# Patient Record
Sex: Female | Born: 1935 | Race: White | Hispanic: No | Marital: Married | Smoking: Former smoker
Health system: Southern US, Community
[De-identification: ages and names within clinical notes are randomized; demographics above are authoritative.]

## PROBLEM LIST (undated history)

## (undated) DIAGNOSIS — I2699 Other pulmonary embolism without acute cor pulmonale: Secondary | ICD-10-CM

## (undated) DIAGNOSIS — R1310 Dysphagia, unspecified: Secondary | ICD-10-CM

## (undated) DIAGNOSIS — E079 Disorder of thyroid, unspecified: Secondary | ICD-10-CM

## (undated) DIAGNOSIS — E785 Hyperlipidemia, unspecified: Secondary | ICD-10-CM

## (undated) DIAGNOSIS — I10 Essential (primary) hypertension: Secondary | ICD-10-CM

## (undated) DIAGNOSIS — R059 Cough, unspecified: Secondary | ICD-10-CM

## (undated) DIAGNOSIS — R05 Cough: Secondary | ICD-10-CM

## (undated) DIAGNOSIS — R0982 Postnasal drip: Secondary | ICD-10-CM

## (undated) DIAGNOSIS — I493 Ventricular premature depolarization: Secondary | ICD-10-CM

## (undated) DIAGNOSIS — Z9289 Personal history of other medical treatment: Secondary | ICD-10-CM

## (undated) DIAGNOSIS — K219 Gastro-esophageal reflux disease without esophagitis: Secondary | ICD-10-CM

## (undated) DIAGNOSIS — D6851 Activated protein C resistance: Secondary | ICD-10-CM

## (undated) DIAGNOSIS — IMO0001 Reserved for inherently not codable concepts without codable children: Secondary | ICD-10-CM

## (undated) DIAGNOSIS — I82409 Acute embolism and thrombosis of unspecified deep veins of unspecified lower extremity: Secondary | ICD-10-CM

## (undated) HISTORY — DX: Disorder of thyroid, unspecified: E07.9

## (undated) HISTORY — DX: Essential (primary) hypertension: I10

## (undated) HISTORY — DX: Cough: R05

## (undated) HISTORY — PX: APPENDECTOMY: SHX54

## (undated) HISTORY — DX: Reserved for inherently not codable concepts without codable children: IMO0001

## (undated) HISTORY — DX: Dysphagia, unspecified: R13.10

## (undated) HISTORY — DX: Cough, unspecified: R05.9

## (undated) HISTORY — DX: Personal history of other medical treatment: Z92.89

## (undated) HISTORY — PX: ESOPHAGEAL DILATION: SHX303

## (undated) HISTORY — DX: Gastro-esophageal reflux disease without esophagitis: K21.9

## (undated) HISTORY — DX: Other pulmonary embolism without acute cor pulmonale: I26.99

## (undated) HISTORY — DX: Acute embolism and thrombosis of unspecified deep veins of unspecified lower extremity: I82.409

## (undated) HISTORY — PX: HAMMER TOE SURGERY: SHX385

## (undated) HISTORY — PX: BUNIONECTOMY: SHX129

## (undated) HISTORY — DX: Activated protein C resistance: D68.51

## (undated) HISTORY — DX: Ventricular premature depolarization: I49.3

## (undated) HISTORY — DX: Postnasal drip: R09.82

## (undated) HISTORY — DX: Hyperlipidemia, unspecified: E78.5

---

## 1971-02-22 HISTORY — PX: CHOLECYSTECTOMY: SHX55

## 1997-07-11 ENCOUNTER — Ambulatory Visit (HOSPITAL_COMMUNITY): Admission: RE | Admit: 1997-07-11 | Discharge: 1997-07-11 | Payer: Self-pay | Admitting: *Deleted

## 1998-03-02 ENCOUNTER — Ambulatory Visit (HOSPITAL_COMMUNITY): Admission: RE | Admit: 1998-03-02 | Discharge: 1998-03-02 | Payer: Self-pay

## 1998-07-03 ENCOUNTER — Ambulatory Visit (HOSPITAL_COMMUNITY): Admission: RE | Admit: 1998-07-03 | Discharge: 1998-07-03 | Payer: Self-pay | Admitting: *Deleted

## 1999-03-05 ENCOUNTER — Encounter: Payer: Self-pay | Admitting: Gastroenterology

## 1999-03-05 ENCOUNTER — Ambulatory Visit (HOSPITAL_COMMUNITY): Admission: RE | Admit: 1999-03-05 | Discharge: 1999-03-05 | Payer: Self-pay

## 1999-03-05 ENCOUNTER — Encounter: Admission: RE | Admit: 1999-03-05 | Discharge: 1999-03-05 | Payer: Self-pay | Admitting: Gastroenterology

## 1999-03-05 ENCOUNTER — Encounter: Payer: Self-pay | Admitting: *Deleted

## 1999-07-16 ENCOUNTER — Ambulatory Visit (HOSPITAL_COMMUNITY): Admission: RE | Admit: 1999-07-16 | Discharge: 1999-07-16 | Payer: Self-pay | Admitting: Obstetrics & Gynecology

## 2000-03-06 ENCOUNTER — Encounter: Payer: Self-pay | Admitting: Family Medicine

## 2000-03-06 ENCOUNTER — Ambulatory Visit (HOSPITAL_COMMUNITY): Admission: RE | Admit: 2000-03-06 | Discharge: 2000-03-06 | Payer: Self-pay | Admitting: Obstetrics & Gynecology

## 2000-07-14 ENCOUNTER — Ambulatory Visit (HOSPITAL_COMMUNITY): Admission: RE | Admit: 2000-07-14 | Discharge: 2000-07-14 | Payer: Self-pay | Admitting: *Deleted

## 2000-08-09 ENCOUNTER — Encounter: Payer: Self-pay | Admitting: *Deleted

## 2000-08-09 ENCOUNTER — Encounter: Admission: RE | Admit: 2000-08-09 | Discharge: 2000-08-09 | Payer: Self-pay | Admitting: *Deleted

## 2001-03-07 ENCOUNTER — Ambulatory Visit (HOSPITAL_COMMUNITY): Admission: RE | Admit: 2001-03-07 | Discharge: 2001-03-07 | Payer: Self-pay | Admitting: Obstetrics & Gynecology

## 2001-03-19 ENCOUNTER — Ambulatory Visit (HOSPITAL_COMMUNITY): Admission: RE | Admit: 2001-03-19 | Discharge: 2001-03-19 | Payer: Self-pay | Admitting: Gastroenterology

## 2001-03-19 ENCOUNTER — Encounter (INDEPENDENT_AMBULATORY_CARE_PROVIDER_SITE_OTHER): Payer: Self-pay | Admitting: Specialist

## 2001-07-20 ENCOUNTER — Ambulatory Visit (HOSPITAL_COMMUNITY): Admission: RE | Admit: 2001-07-20 | Discharge: 2001-07-20 | Payer: Self-pay | Admitting: *Deleted

## 2001-09-05 ENCOUNTER — Encounter: Payer: Self-pay | Admitting: Family Medicine

## 2001-09-05 ENCOUNTER — Encounter: Admission: RE | Admit: 2001-09-05 | Discharge: 2001-09-05 | Payer: Self-pay | Admitting: Family Medicine

## 2001-12-04 ENCOUNTER — Encounter: Payer: Self-pay | Admitting: Family Medicine

## 2001-12-04 ENCOUNTER — Encounter: Admission: RE | Admit: 2001-12-04 | Discharge: 2001-12-04 | Payer: Self-pay | Admitting: Family Medicine

## 2002-01-07 ENCOUNTER — Encounter: Payer: Self-pay | Admitting: Specialist

## 2002-01-11 ENCOUNTER — Inpatient Hospital Stay (HOSPITAL_COMMUNITY): Admission: RE | Admit: 2002-01-11 | Discharge: 2002-01-21 | Payer: Self-pay | Admitting: Specialist

## 2002-01-11 HISTORY — PX: REPLACEMENT TOTAL KNEE: SUR1224

## 2002-01-14 ENCOUNTER — Encounter: Payer: Self-pay | Admitting: Specialist

## 2002-03-13 ENCOUNTER — Ambulatory Visit (HOSPITAL_COMMUNITY): Admission: RE | Admit: 2002-03-13 | Discharge: 2002-03-13 | Payer: Self-pay | Admitting: Family Medicine

## 2002-03-13 ENCOUNTER — Encounter: Payer: Self-pay | Admitting: Family Medicine

## 2002-07-26 ENCOUNTER — Encounter (INDEPENDENT_AMBULATORY_CARE_PROVIDER_SITE_OTHER): Payer: Self-pay | Admitting: *Deleted

## 2002-07-26 ENCOUNTER — Ambulatory Visit (HOSPITAL_COMMUNITY): Admission: RE | Admit: 2002-07-26 | Discharge: 2002-07-26 | Payer: Self-pay | Admitting: *Deleted

## 2002-11-28 ENCOUNTER — Encounter: Payer: Self-pay | Admitting: Emergency Medicine

## 2002-11-28 ENCOUNTER — Emergency Department (HOSPITAL_COMMUNITY): Admission: EM | Admit: 2002-11-28 | Discharge: 2002-11-28 | Payer: Self-pay | Admitting: Emergency Medicine

## 2003-03-17 ENCOUNTER — Ambulatory Visit (HOSPITAL_COMMUNITY): Admission: RE | Admit: 2003-03-17 | Discharge: 2003-03-17 | Payer: Self-pay | Admitting: Family Medicine

## 2003-09-15 ENCOUNTER — Other Ambulatory Visit: Admission: RE | Admit: 2003-09-15 | Discharge: 2003-09-15 | Payer: Self-pay | Admitting: Family Medicine

## 2004-03-17 ENCOUNTER — Ambulatory Visit (HOSPITAL_COMMUNITY): Admission: RE | Admit: 2004-03-17 | Discharge: 2004-03-17 | Payer: Self-pay | Admitting: Family Medicine

## 2004-07-27 ENCOUNTER — Encounter: Admission: RE | Admit: 2004-07-27 | Discharge: 2004-07-27 | Payer: Self-pay | Admitting: Family Medicine

## 2004-09-20 ENCOUNTER — Ambulatory Visit: Payer: Self-pay | Admitting: Emergency Medicine

## 2004-09-27 ENCOUNTER — Ambulatory Visit: Payer: Self-pay | Admitting: Emergency Medicine

## 2004-10-05 ENCOUNTER — Ambulatory Visit (HOSPITAL_COMMUNITY): Admission: RE | Admit: 2004-10-05 | Discharge: 2004-10-05 | Payer: Self-pay | Admitting: Emergency Medicine

## 2004-10-21 ENCOUNTER — Ambulatory Visit: Payer: Self-pay | Admitting: Emergency Medicine

## 2004-12-03 ENCOUNTER — Ambulatory Visit (HOSPITAL_COMMUNITY): Admission: RE | Admit: 2004-12-03 | Discharge: 2004-12-03 | Payer: Self-pay | Admitting: Gastroenterology

## 2005-02-08 ENCOUNTER — Encounter: Admission: RE | Admit: 2005-02-08 | Discharge: 2005-02-08 | Payer: Self-pay | Admitting: Family Medicine

## 2005-03-25 ENCOUNTER — Ambulatory Visit (HOSPITAL_COMMUNITY): Admission: RE | Admit: 2005-03-25 | Discharge: 2005-03-25 | Payer: Self-pay | Admitting: Family Medicine

## 2005-04-13 ENCOUNTER — Encounter: Admission: RE | Admit: 2005-04-13 | Discharge: 2005-04-13 | Payer: Self-pay | Admitting: Family Medicine

## 2005-07-25 ENCOUNTER — Ambulatory Visit: Payer: Self-pay | Admitting: Emergency Medicine

## 2005-08-15 ENCOUNTER — Ambulatory Visit: Payer: Self-pay | Admitting: Emergency Medicine

## 2005-10-11 ENCOUNTER — Encounter: Admission: RE | Admit: 2005-10-11 | Discharge: 2005-10-11 | Payer: Self-pay | Admitting: Family Medicine

## 2006-04-18 ENCOUNTER — Encounter: Admission: RE | Admit: 2006-04-18 | Discharge: 2006-04-18 | Payer: Self-pay | Admitting: Family Medicine

## 2006-07-27 ENCOUNTER — Ambulatory Visit: Payer: Self-pay | Admitting: *Deleted

## 2006-08-02 ENCOUNTER — Encounter: Admission: RE | Admit: 2006-08-02 | Discharge: 2006-08-02 | Payer: Self-pay | Admitting: Family Medicine

## 2006-10-13 ENCOUNTER — Ambulatory Visit: Payer: Self-pay | Admitting: Vascular Surgery

## 2007-04-27 ENCOUNTER — Encounter: Admission: RE | Admit: 2007-04-27 | Discharge: 2007-04-27 | Payer: Self-pay | Admitting: Family Medicine

## 2007-11-15 DIAGNOSIS — R0982 Postnasal drip: Secondary | ICD-10-CM

## 2007-11-15 DIAGNOSIS — J3089 Other allergic rhinitis: Secondary | ICD-10-CM

## 2007-11-15 DIAGNOSIS — K219 Gastro-esophageal reflux disease without esophagitis: Secondary | ICD-10-CM

## 2007-11-15 DIAGNOSIS — I10 Essential (primary) hypertension: Secondary | ICD-10-CM

## 2007-11-15 DIAGNOSIS — Z86718 Personal history of other venous thrombosis and embolism: Secondary | ICD-10-CM | POA: Insufficient documentation

## 2007-11-15 DIAGNOSIS — R05 Cough: Secondary | ICD-10-CM

## 2007-11-15 DIAGNOSIS — R058 Other specified cough: Secondary | ICD-10-CM | POA: Insufficient documentation

## 2007-11-15 DIAGNOSIS — J302 Other seasonal allergic rhinitis: Secondary | ICD-10-CM

## 2007-11-15 DIAGNOSIS — R053 Chronic cough: Secondary | ICD-10-CM | POA: Insufficient documentation

## 2007-11-16 ENCOUNTER — Ambulatory Visit: Payer: Self-pay | Admitting: Internal Medicine

## 2007-12-17 ENCOUNTER — Ambulatory Visit: Payer: Self-pay | Admitting: Emergency Medicine

## 2007-12-27 HISTORY — PX: OTHER SURGICAL HISTORY: SHX169

## 2007-12-28 DIAGNOSIS — Z9289 Personal history of other medical treatment: Secondary | ICD-10-CM

## 2007-12-28 HISTORY — DX: Personal history of other medical treatment: Z92.89

## 2007-12-28 HISTORY — PX: TRANSTHORACIC ECHOCARDIOGRAM: SHX275

## 2008-04-29 ENCOUNTER — Encounter: Admission: RE | Admit: 2008-04-29 | Discharge: 2008-04-29 | Payer: Self-pay | Admitting: Family Medicine

## 2008-09-04 ENCOUNTER — Ambulatory Visit: Payer: Self-pay | Admitting: Pulmonary Disease

## 2008-09-04 DIAGNOSIS — J310 Chronic rhinitis: Secondary | ICD-10-CM | POA: Insufficient documentation

## 2008-09-05 ENCOUNTER — Telehealth (INDEPENDENT_AMBULATORY_CARE_PROVIDER_SITE_OTHER): Payer: Self-pay | Admitting: *Deleted

## 2008-09-05 ENCOUNTER — Encounter: Payer: Self-pay | Admitting: Pulmonary Disease

## 2008-09-08 ENCOUNTER — Telehealth: Payer: Self-pay | Admitting: Emergency Medicine

## 2008-10-02 ENCOUNTER — Telehealth (INDEPENDENT_AMBULATORY_CARE_PROVIDER_SITE_OTHER): Payer: Self-pay | Admitting: *Deleted

## 2008-10-22 ENCOUNTER — Ambulatory Visit: Payer: Self-pay | Admitting: Emergency Medicine

## 2008-11-27 ENCOUNTER — Ambulatory Visit: Payer: Self-pay | Admitting: Emergency Medicine

## 2009-01-05 ENCOUNTER — Encounter: Admission: RE | Admit: 2009-01-05 | Discharge: 2009-01-05 | Payer: Self-pay | Admitting: Family Medicine

## 2009-03-05 DIAGNOSIS — J309 Allergic rhinitis, unspecified: Secondary | ICD-10-CM | POA: Insufficient documentation

## 2009-05-05 ENCOUNTER — Encounter: Admission: RE | Admit: 2009-05-05 | Discharge: 2009-05-05 | Payer: Self-pay | Admitting: Family Medicine

## 2009-06-01 ENCOUNTER — Ambulatory Visit: Payer: Self-pay | Admitting: Emergency Medicine

## 2009-10-14 DIAGNOSIS — M47816 Spondylosis without myelopathy or radiculopathy, lumbar region: Secondary | ICD-10-CM | POA: Insufficient documentation

## 2010-03-14 ENCOUNTER — Encounter: Payer: Self-pay | Admitting: Family Medicine

## 2010-03-25 NOTE — Assessment & Plan Note (Signed)
Summary: cough   Visit Type:  Follow-up  CC:  Pt here for 6 month follow up. Pt states cough is less still productive with white mucus and breathing is "fine".  History of Present Illness: 77 yo female with Chronic rhinitis, chronic cough, and GERD.  ROV 10/22/08 -- Had been doing very well for over a year. Saw Dr Craige Cotta a month ago with recurrent PND and cough productive of thick sputum after she had a URI. He tried her on NSW's (she used to be on these), continued Nasonex, loratadine. Her cough is a bit better now, but still happening. No longer on a PPI.   ROV 11/27/08 -- follows up for her cough. Much improved after restarting NSW's and omeprazole. Has also been taking claritin + nasonex. Also stopped Symbicort, has not missed it.   ROV 06/01/09 -- Returns for f/u. Stopped Nasonex, still on loratadine. Still doing NSWs. Stopped omeprazole last month, hasn't really missed. Not on any BD's.     Current Medications (verified): 1)  Warfarin Sodium 7.5 Mg Tabs (Warfarin Sodium) .... Use As Directed 2)  Simvastatin 5 Mg Tabs (Simvastatin) .... Take 1 Tab By Mouth At Bedtime 3)  Centrum Silver  Tabs (Multiple Vitamins-Minerals) .... Take 1 Tablet By Mouth Once A Day 4)  Vitamin C 500 Mg Tabs (Ascorbic Acid) .... Take 1 Tablet By Mouth Once A Day 5)  Calcium 600+d 600-400 Mg-Unit Tabs (Calcium Carbonate-Vitamin D) .... Take 1 Tablet By Mouth Two Times A Day 6)  Alphagan P 0.1 % Soln (Brimonidine Tartrate) .Marland Kitchen.. 1 Drop in Each Eye Every 12 Hours 7)  Omeprazole 10 Mg Cpdr (Omeprazole) .... Take 1 Tablet By Mouth Once A Day 8)  Netti Pot .... Once Daily 9)  Fish Oil 1000 Mg Caps (Omega-3 Fatty Acids) .... Take 1 Capsule By Mouth Once A Day 10)  Levothyroxine Sodium 25 Mcg Tabs (Levothyroxine Sodium) .... Take 1 Tablet By Mouth Once A Day 11)  Vitamin D 1000 Unit Tabs (Cholecalciferol) .... Take 1 Tablet By Mouth Once A Day 12)  Alendronate Sodium 70 Mg Tabs (Alendronate Sodium) .... Take One Tab By  Mouth Once A Week 13)  Losartan Potassium 50 Mg Tabs (Losartan Potassium) .... Take 1 Tablet By Mouth Once A Day  Allergies (verified): 1)  ! Codeine 2)  ! Erythromycin 3)  ! * Narcotics  Vital Signs:  Patient profile:   77 year old female Height:      64 inches Weight:      146.38 pounds O2 Sat:      97 % on Room air Temp:     98.2 degrees F oral Pulse rate:   89 / minute BP sitting:   122 / 62  (left arm) Cuff size:   regular  Vitals Entered By: Zackery Barefoot CMA (June 01, 2009 1:41 PM)  O2 Flow:  Room air CC: Pt here for 6 month follow up. Pt states cough is less still productive with white mucus and breathing is "fine" Comments Medications reviewed with patient Verified contact number and pharmacy with patient Zackery Barefoot CMA  June 01, 2009 1:41 PM    Physical Exam  General:  normal appearance and healthy appearing.   Head:  normocephalic and atraumatic Ears:  TMs intact and clear with normal canals Nose:  Narrow nasal angles, clear discharge, no tenderness Mouth:  mild erythema, no exudates Neck:  no JVD.   Chest Wall:  kyphosis.   Lungs:  clear bilaterally to auscultation and  percussion Heart:  regular rate and rhythm, S1, S2 without murmurs, rubs, gallops, or clicks Abdomen:  not examined Pulses:  pulses normal Extremities:  no clubbing, cyanosis, edema, or deformity noted Cervical Nodes:  no significant adenopathy   Impression & Recommendations:  Problem # 1:  COUGH, CHRONIC (ICD-786.2)  Improved and fairly stable at this time off rx for GERD, off nasal steroid. maintained on [place] and loratadine - continue [place] and loratadine for now, consider peeling off this Summer after pollen season.  - rov in 1 yr or as needed   Orders: Est. Patient Level III (47829)  Problem # 2:  CHRONIC RHINITIS (ICD-472.0)  Orders: Est. Patient Level III (56213)  Medications Added to Medication List This Visit: 1)  Simvastatin 5 Mg Tabs (Simvastatin) .... Take 1  tab by mouth at bedtime 2)  Vitamin C 500 Mg Tabs (Ascorbic acid) .... Take 1 tablet by mouth once a day 3)  Omeprazole 10 Mg Cpdr (Omeprazole) .... Take 1 tablet by mouth once a day 4)  Fish Oil 1000 Mg Caps (Omega-3 fatty acids) .... Take 1 capsule by mouth once a day 5)  Levothyroxine Sodium 25 Mcg Tabs (Levothyroxine sodium) .... Take 1 tablet by mouth once a day 6)  Vitamin D 1000 Unit Tabs (Cholecalciferol) .... Take 1 tablet by mouth once a day 7)  Alendronate Sodium 70 Mg Tabs (Alendronate sodium) .... Take one tab by mouth once a week 8)  Losartan Potassium 50 Mg Tabs (Losartan potassium) .... Take 1 tablet by mouth once a day  Patient Instructions: 1)  Continue your nasal washes and loratadine every day. You may be able to try stopping these medications this Summer afte rpollen season.  2)  Follow up with Dr Delton Coombes in 1 year or sooner if you develop problems with your cough or breathing.

## 2010-03-31 ENCOUNTER — Other Ambulatory Visit (HOSPITAL_COMMUNITY): Payer: Self-pay | Admitting: Family Medicine

## 2010-03-31 DIAGNOSIS — Z1231 Encounter for screening mammogram for malignant neoplasm of breast: Secondary | ICD-10-CM

## 2010-05-11 ENCOUNTER — Ambulatory Visit (HOSPITAL_COMMUNITY)
Admission: RE | Admit: 2010-05-11 | Discharge: 2010-05-11 | Disposition: A | Discharge status: Home / Self Care | Payer: Medicare Other | Source: Ambulatory Visit | Attending: Family Medicine | Admitting: Family Medicine

## 2010-05-11 DIAGNOSIS — Z1231 Encounter for screening mammogram for malignant neoplasm of breast: Secondary | ICD-10-CM | POA: Insufficient documentation

## 2010-07-06 NOTE — Consult Note (Signed)
NEW PATIENT CONSULTATION   TIENNA, BIENKOWSKI  DOB:  [birth year]                                       10/13/2006  ZOXWR#:[id number]   Patient presents today for evaluation of lower extremity edema.  She is  a very pleasant 77 year old white female who was seen for evaluation of  lower extremity edema.  She reports this had been more severe over the  past several weeks and is now resolving.  This does come and go.  She  reports that it does progress throughout the day and is less swollen in  the morning when she first arises.  She does have a history of pulmonary  embolus and is on chronic Coumadin therapy.  She was found to have  heterozygous Factor V Leiden.  She had a right total knee replacement in  2003 and presented following this with a lung nodule and had an  incidental finding of pulmonary embolus, which initiated her workup.  She does not recall any documentation of specific lower extremity deep  venous thrombosis at the time of this.  Her edema is equal, right and  left leg.  She does not have any specific lower extremity pain.   Her past history is significant for gastroesophageal reflux,  diverticulosis, colon polyps, hyperlipidemia, osteopenia, depression.  She does not have a history of premature atherosclerotic disease or  clotting disorders in her family.   SOCIAL HISTORY:  She is married with four children.  She does not smoke  or drink alcohol.   Medication allergies are CODEINE and ERYTHROMYCIN.   Current medications are Coumadin, Nasacort.   PHYSICAL EXAMINATION:  A well-developed and well-nourished white female  appearing stated age of 36.  Blood pressure is 150/96, pulse 90,  respirations 18.  Her radial pulses are 2+ bilaterally.  She has 2+  dorsalis pedis pulses.  She does have some cyanosis and mild edema in  her lower extremities bilaterally.  She does have pitting edema.   She underwent noninvasive vascular laboratory studies in  our office on  Jun 27, 2006, and I have reviewed these with her.  This reveals no  evidence of deep venous thrombosis or valvular incompetence in her  superficial or deep system.  I explained this in detail to the patient.  I explained that I would not recommend treatment other than just  elevation when possible or possible diuretic therapy if this progresses.  I did explain the use of compression garments, and we have fitted her  with knee-high 20-30 mmHg compression garments to use on a p.r.n. basis.  She was comfortable with this discussion and will see [place] again as  needed.   Larina Earthly, M.D.  Electronically Signed   TFE/MEDQ  D:  10/13/2006  T:  10/16/2006  Job:  [id number]   cc:   Talmadge Coventry, M.D.

## 2010-07-09 NOTE — Discharge Summary (Signed)
NAME:  Robin Harrell, Robin Harrell NO.:  [id number]   MEDICAL RECORD NO.:  [id number]                   PATIENT TYPE:  INP   LOCATION:  0483                                 FACILITY:  [facility]   PHYSICIAN:  Erasmo Leventhal, M.D.         DATE OF BIRTH:  08-10-1933   DATE OF ADMISSION:  01/11/2002  DATE OF DISCHARGE:  01/21/2002                                 DISCHARGE SUMMARY   ADMISSION DIAGNOSIS:  End-stage osteoarthritis right knee.   DISCHARGE DIAGNOSIS:  End-stage osteoarthritis right knee.   OPERATION:  Total knee replacement right knee.   BRIEF HISTORY:  This is a 77 year old lady seen for continuing progressive  problems concerning her right knee.  She has had injections into her knee  with corticosteroids without relief.  X-rays showed end-stage degenerative  arthritis of the knee.  After discussion of treatment options the patient is  scheduled for total knee replacement.  Surgery, risks, benefits, and  aftercare were discussed with the patient.   LABORATORY DATA:  Admission CBC within normal limits with the exception of  platelets slightly low at 145.  Her hemoglobin postoperatively reached a low  of 9.4 and 27.6 and then came back up to 10.5 and 30.9 by discharge.  Her PT  and INR were normal on admission, and the patient was followed up on  Coumadin protocol and left the hospital on Coumadin protocol.  She did reach  a high on January 21, 2002, of 23.5 with an INR of 2.4, and this will be  followed and carefully monitored per home Coumadin protocol.  Her admission  CMET was within normal limits.  Postoperatively her sodium reached a low of  131 and was corrected by January 14, 2002, back to normal limits and  remained within normal limits.  Urinalysis was normal.   COURSE IN THE HOSPITAL:  The patient tolerated the operative procedure well.  Postoperatively the patient did well.  She had a temperature to 100.6 on the  second postoperative  day.  Her vital signs remained stable.  Her hemoglobin  and hematocrit reached a low of 9.4 and 27.6.  Her incision looked good.  Pulses were intact.  Her epidural was removed, and she was started on  Coumadin protocol.  On the third postoperative day she continued to rest  comfortably.  She had a temperature to 101.  Vital signs were stable.  Neurovascular was intact in the leg.  She started PT and OT protocol as well  as daily dressing changes.  The wound was benign.  On postoperative day #4  the patient was resting comfortably.  She had a CT scan for her chest and  was discovered to have a pulmonary embolus in the right middle and upper  lobes.  She did not have shortness of breath but did have some chest  congestion that brought this about, and this was found to be an asymptomatic  PE.  A  medicine consult was obtained for management of her pulmonary embolus  and heparinization until her Coumadin reached therapeutic levels.  The  patient continued to do well from her total knee replacement.  Her fever  started to come down.  She was started on heparin protocol per pharmacy  until her Coumadin came up.  On postoperative day #6 the patient continued  to do well.  Pain was controlled on p.o. medications.  Vital signs were  stable.  She was afebrile.  She was put to bed rest without therapy pending  release to therapy and more active activities by medicine service due to her  PE.  On postoperative day #7 the patient was comfortable.  Vital signs were  stable.  Dressing was changed, wound was benign.  The patient was to start  gentle range of motion to the knee, but CPM was held off until the patient  was off her heparin.  On postoperative day #8 the patient continued to do  well from her orthopedic standpoint.  Management of her Coumadin and heparin  was continued by pharmacy protocol and medicine service.  The heparin was  discontinued on postoperative day #8 when the Coumadin was  therapeutic, and  PT per total knee replacement protocol was started in full.  Subsequently,  on January 21, 2002, with the patient feeling good, no complications, vital  signs stable, afebrile, no complaints of shortness of breath or calf  tenderness, and her Coumadin being therapeutic, the patient was subsequently  discharged home for follow-up in the office.   CONDITION ON DISCHARGE:  Improved.   DISCHARGE MEDICATIONS:  1. Percocet 5 mg, 1 q.6-8h. p.r.n. pain.  2. Robaxin 500 mg, 1 q.8h. p.r.n. spasm.  3. Coumadin per pharmacy protocol.   FOLLOW-UP:  In the office in one week.   DISCHARGE INSTRUCTIONS:  She was instructed to progress in her therapy as  instructed.  To void any strenuous activities.  To call the medicine service  should she have any difficulties in regards to shortness of breath, chest  pains, or other problems, and to call us with any problems about her knee.     Jaquelyn Bitter. Chabon, P.A.                   Erasmo Leventhal, M.D.    SJC/MEDQ  D:  04/01/2002  T:  04/01/2002  Job:  [id number]

## 2010-07-09 NOTE — Op Note (Signed)
NAME:  Robin Harrell, Robin Harrell NO.:  [id number]   MEDICAL RECORD NO.:  [id number]          PATIENT TYPE:  AMB   LOCATION:  ENDO                         FACILITY:  [facility]   PHYSICIAN:  Petra Kuba, M.D.    DATE OF BIRTH:  [birth year]   DATE OF PROCEDURE:  12/03/2004  DATE OF DISCHARGE:                                 OPERATIVE REPORT   PROCEDURE:  EGD with Savary dilatation.   INDICATION:  Abnormal barium swallow, dysphagia. Upper tract symptoms.  Consent signed after risks, benefits, methods, options thoroughly discussed  in the office.   MEDICINES USED:  Fentanyl 50 mcg, Versed 5 mg.   PROCEDURE:  The video endoscope was inserted by direct vision.  Her  esophagus did have some spasm but no obvious stricture or ring was seen.  I  doubt that she had a hiatal hernia today.  Scope passed into the stomach,  advanced to the antrum.  There was some minimal gastritis seen.  Advanced  through a normal pylorus into a normal duodenal bulb and around the C-loop  to a normal second portion of the duodenum. Scope was drawn back to the bulb  which again confirmed normal appearance. Scope was withdrawn back to the  stomach.  On retroflexion the cardia, fundus, angularis, lesser and greater  curve were normal except for the minimal amount of gastritis.  Tiny proximal  gastric polyp was seen but not biopsied. It was probably fundic gland polyp  or hyperplastic.  Scope was straightened. Straight visualization of stomach  did not reveal any additional findings. Scope was then slowly withdrawn back  to about 20 cm in the esophagus and the scope re-advanced, confirmed normal  esophageal findings.  There was some spasm again but no obvious mucosal  lesions.  We reinserted into the stomach.  The Savary wire was advanced.  The customary J loop was confirmed in the antrum, both under fluoroscopic  and endoscopic visualization.  The scope was removed making sure to keep the  wire in the proper  position and we went ahead and proceeded with a 16-mm  dilator only.  Once the dilator was confirmed in the stomach under fluoro,  the dilator and wire were removed in tandem. There was minimal resistance on  initially passing a dilator in the posterior pharynx but no other  resistance.  We doubted there was any heme on the dilator. We think just a  touch of lipstick.  The procedure was terminated at this juncture. The  patient tolerated the procedure well. There was no obvious immediate  complication.   ENDOSCOPIC DIAGNOSES:  1.  No obvious stricture and doubt hiatal hernia.  2.  Esophagus with some spasm but no obvious abnormality.  3.  Minimal gastritis.  4.  Tiny proximal polyp not biopsied.  5.  Otherwise within normal limits EGD.   THERAPY:  Savary dilatation under fluoro to 16 mm only.   PLAN:  See how the dilation works, continue pump inhibitors. Follow up  p.r.n. or 2 months just to make sure no further workup plans are needed from  my standpoint.  December 02, 2004           ______________________________  Petra Kuba, M.D.     MEM/MEDQ  D:  12/03/2004  T:  12/03/2004  Job:  [id number]   cc:   Leslye Peer, M.D.   Talmadge Coventry, M.D.  Fax: [phone]

## 2010-07-09 NOTE — Op Note (Signed)
NAME:  Robin Harrell, Robin Harrell NO.:  [id number]   MEDICAL RECORD NO.:  [id number]                   PATIENT TYPE:  INP   LOCATION:  0003                                 FACILITY:  [facility]   PHYSICIAN:  Erasmo Leventhal, M.D.         DATE OF BIRTH:  02/19/1934   DATE OF PROCEDURE:  01/11/2002  DATE OF DISCHARGE:                                 OPERATIVE REPORT   PREOPERATIVE DIAGNOSES:  Right knee end-stage osteoarthritis.   POSTOPERATIVE DIAGNOSES:  Right knee end-stage osteoarthritis.   PROCEDURE:  Right total knee arthroplasty.   SURGEON:  Erasmo Leventhal, M.D.   ASSISTANT:  Cherly Beach, P.A.-C.   ANESTHESIA:  Spinal epidural, Dr. Rica Mast.   ESTIMATED BLOOD LOSS:  Less than 50 cc.   DRAINS:  Two medium Hemovacs.   COMPLICATIONS:  None.   TOURNIQUET TIME:  90 minutes at 350 mmHg.   DISPOSITION:  To PACU stable.   OPERATIVE IMPLANTS:  Osteonics components. All cemented. A size 7 femur,  size 7 tibia, 26 patella and a 12 mm PolyFlex type insert posterior  stabilized.   DESCRIPTION OF PROCEDURE:  The patient was counseled in the holding area and  correct side was identified. The chart was signed appropriately, an IV  started, antibiotics given.  The patient was placed in the supine position  and lateral with a spinal epidural administered by Dr. Rica Mast. She was then  turned supine, Foley catheter placed utilizing sterile technique by the OR  circulating nurse.   The right knee was examined and she was in slight varus. She had a 15 degree  flexion contracture and she could flex to 125 degrees.  The elevator was  prepped with Durapred and draped in a sterile fashion.  It was exsanguinated  and the tourniquet was inflated to 350 mmHg.   A straight midline incision was made through the skin and subcutaneous  tissue, midline soft tissue, flaps developed at the appropriate level and  medial parapatellar arthrotomy was performed and a  proximal medial soft  tissue release on the tibia. The patella was found to be markedly arthritic.  She also was found to be very osteoporotic with soft thin bone. The patella  was everted, knee was flexed. End-stage osteoarthritic changes, bone against  bone contact. The cruciate ligaments were resected. The osteophytes removed,  starting holes made in the distal femur, canal was irrigated and the  effluent was clear and an intermedullary rod was gently placed. We chose a 5  degree valgus cut and took a 12 mm cut off the distal femur due to her  significant flexion contracture. The distal femur was found to be a size #7,  rotation marks were made, distal femur was cut to fit a size #7.   Medial and lateral menisci were removed under direct visualization,  significant vessels were coagulated. The tibial osteophytes were removed as  was the eminence and was found to be  a size 7. A starting hole was made, it  was irrigated until the canal effluent was clear, intermedullary rod was  gently placed. We chose a 5 degree posterior slope with a 10-mm cut based  upon the lateral side, put in slight rotation to fit the proximal tibia.  The posteromedial approach and osteophytes removed under direct  visualization. At this point in time, the femoral trochlea was repaired.   A trial 7 tibia with a 10 mm insert with excellent range of motion and soft  tissue balance and alignment. Rotation marks were made and the delta keel  was performed in standard fashion.   I gave appropriate thought to the patella. It was very concave and thin;  however, I reamed her to the appropriate depth and made lodging holes to get  in a size 26 recessed patella given excellent circumferential bone mantle  with at least a 12 mm thickness on the anterior aspect. At this point in  time utilizing modern cement technique, all components were cemented into  place. This was done under pulsatile lavage and thorough  irrigation.   A size #7 tibia, size #7 femur posterior stabilized with a 26 mm patella.  After the cement had cured, the trials of 10 and 12 mm tibial inserts with  the 12 mm insert with excellent flexion extension balance, range of motion,  __________ traction anatomically prior to lateral release. The trial was  removed, excess cement was removed, bone wax was removed to expose bony  surfaces and a size 7, 12 mm thick flex component was placed.   Two medium Hemovac drains were placed.   Sequential closure of layers was done and I irrigated each layer during the  closure. Arthrotomy with Vicryl, subcu Vicryl, skin closed with subcuticular  Monocryl suture. Steri-Strips were applied, drains then hooked to suction.  Circumflex dressing applied, tourniquet was deflated. She had excellent  pulses in the foot and ankle at the end of the case. She was given another  gram of Ancef at the end of the case and tourniquet deflated. Knee  immobilizer was applied. There were no complications. Sponge and needle  count were correct. She was then taken from to the operating room to PACU in  stable condition.   To decrease surgical time and help with retraction, Mr. Cherly Beach  assisted me throughout this case.                                                Erasmo Leventhal, M.D.    RAC/MEDQ  D:  01/11/2002  T:  01/11/2002  Job:  [id number]

## 2010-07-09 NOTE — Procedures (Signed)
[facility]  Patient:    Robin Harrell, Robin Harrell Visit Number: [id number] MRN: [id number]          Service Type: END Location: ENDO Attending Physician:  Nelda Marseille Dictated by:   Petra Kuba, M.D. Proc. Date: 03/19/01 Admit Date:  03/19/2001   CC:         Talmadge Coventry, M.D.   Procedure Report  PROCEDURE:  Colonoscopy with biopsy.  INDICATIONS FOR PROCEDURE:  History of colon polyps.  Consent was signed after risks, benefits, methods, and options were thoroughly discussed in the office.  MEDICINES USED:  Demerol 50, Versed 7.  DESCRIPTION OF PROCEDURE:  Rectal inspection is pertinent for external hemorrhoids. Digital exam was negative. The pediatric video colonoscope was inserted and with moderate difficulty due to a tortuous sigmoid, it was able to be advanced to the cecum. There were left sided diverticula and tortuosity. No other abnormality were seen as we slowly advanced to the cecum. The cecum was identified by the appendiceal orifice and the ileocecal valve. In fact, the scope was inserted a short ways into the terminal ileum which was normal. The scope was slowly withdrawn. The prep was adequate. There was some liquid stool that required washing and suctioning. On slow withdrawal through the colon, the cecum, ascending and transverse was normal as the scope was withdrawn around the left side of the colon some diverticula were seen. In the mid sigmoid, a tiny polyp was seen and was cold biopsied x 2. The scope was further withdrawn. No additional findings were seen as we slowly withdrew back to the rectum except for left sided diverticula. Once back in the rectum, the scope was retroflexed pertinent for some small internal hemorrhoids. The scope was straightened and short ways up the sigmoid, air was suctioned, scope removed. The patient tolerated the procedure fairly adequately. There was no obvious or immediate  complication.  ENDOSCOPIC DIAGNOSIS:  1. Internal/external hemorrhoids.  2. Left sided diverticula and tortuosity.  3. Tiny sigmoid polyp cold biopsied.  4. Otherwise within normal limits to the cecum and the terminal ileum.  PLAN:  Await pathology to determine future colonic screening. GI follow-up p.r.n. Otherwise return care to Talmadge Coventry, M.D. for the customary health care maintenance to include yearly rectals and guaiacs. Dictated by:   Petra Kuba, M.D. Attending Physician:  Nelda Marseille DD:  03/19/01 TD:  03/19/01 Job: [id number]

## 2010-07-09 NOTE — H&P (Signed)
NAME:  Robin Harrell, Robin Harrell NO.:  [id number]   MEDICAL RECORD NO.:  [id number]                    PATIENT TYPE:   LOCATION:                                       FACILITY:   PHYSICIAN:  Erasmo Leventhal, M.D.         DATE OF BIRTH:  [birth year]   DATE OF ADMISSION:  01/11/2002  DATE OF DISCHARGE:                                HISTORY & PHYSICAL   CHIEF COMPLAINT:  Pain in my right knee.   HISTORY OF PRESENT ILLNESS:  This 77 year old lady has been seen by [place] for  continuing progressive problems concerning her right knee.  She has had  corticosteroids injections into the knee, as well as been taking anti-  inflammatories with little to no help.  She has advanced with degenerative  changes.  X-rays have shown end-stage arthritis in multiple compartments of  the knee.  She is a very active 77 year old lady, and she is having more and  more difficulty getting about, and this is certainly interfering with her  lifestyle and mood.  After much discussion and consideration, as well as  explanation of possible side effects and adverse reactions, as well as the  benefits of surgery, she decided to go ahead with total knee replacement  arthroplasty of the right knee.   PAST MEDICAL HISTORY:  This lady has really been in good health throughout  her lifetime.  The patient had some mild depression in the past.   PAST SURGICAL HISTORY:  1. Cholecystectomy.  2. Appendectomy in 6/73.  3. Bunionectomy and toe straightened out about 10 years ago.  4. Hammer toe surgery about five years ago.   PRIMARY CARE PHYSICIAN:  Talmadge Coventry, M.D., and she is treating her  for gastroesophageal reflux disease, some diverticulitis, urinary  incontinency, and osteoporosis.   CURRENT MEDICATIONS:  1. Fosamax 70 mg once a week.  2. Ditropan XL 5 mg one q.d.  3. Nexium 40 mg one q.d.  4. Zyrtec 10 mg one q.d. p.r.n.  5. Vioxx 25 mg p.r.n. (will stop prior to surgery).  6. Caltrate 600 plus D and one Centrum q.d.   ALLERGIES:  1. CODEINE.  2. ERYTHROMYCIN.  3. MOST PAIN PILLS.   SOCIAL HISTORY:  The patient is married.  She is happily married.  She is a  part-time Film/video editor.  She has no intake of tobacco or alcohol products.  Her husband will be her caregiver after surgery in a two story house.  There  are four stairs into the home, and 12 to the basement.   FAMILY HISTORY:  Positive for heart disease.   REVIEW OF SYMPTOMS:  CNS:  No seizures, shoulder paralysis, numbness, or  double vision.  RESPIRATORY:  No productive cough, no hemoptysis, occasional  bronchitis at least once a year, but no shortness of breath for any period  of time.  CARDIOVASCULAR:  No chest pain, no angina, no orthopnea.  GASTROINTESTINAL:  No nausea, vomiting, melena,  or bloody stools.  GENITOURINARY:  She has urinary incontinence, being treated with Ditropan,  no dysuria or hematuria.  MUSCULOSKELETAL:  Primarily in history of present  illness of the right knee.   PHYSICAL EXAMINATION:  GENERAL:  Alert, cooperative, friendly, 77 year old  white female who is looking somewhat younger then her stated age.  VITAL SIGNS:  Blood pressure 140/68, pulse 80, respirations 12.  HEENT:  Normocephalic.  Pupils equal, round, reactive to light and  accommodation.  Extraocular movements were intact.  Oropharynx is clear.  CHEST:  Clear to auscultation, no rhonchi, no rales.  HEART:  Regular rate and rhythm with a grade 2/6 ejection murmur.  ABDOMEN:  Soft, nontender, liver and spleen not felt.  GENITALIA:  Not done, not pertinent to present illness.  RECTAL:  Not done, not pertinent to present illness.  PELVIC:  Not done, not pertinent to present illness.  BREASTS:  Not done, not pertinent to present illness.  EXTREMITIES:  The patient has crepitus with range of motion, some mild  valgus deformity of the right knee.   ADMISSION DIAGNOSES:  1. 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  2. Gastroesophageal reflux disease.  3. Urinary incontinency.  4. Diverticulitis.   PLAN:  The patient will be admitted for right total knee replacement  arthroplasty.  She stated it is okay to use banked blood if she needed any  transfusion.  Primary plan is for her to have home health after her regular  hospitalization.  This of course will depend on her level of activity during  the immediate postoperative total knee replacement arthroplasty protocol.  Should we have any medical problems, we will certainly ask Dr. Smith Mince to  follow along with [place] during this patient's hospitalization.       Dooley L. Shela Nevin, P.A.             Erasmo Leventhal, M.D.    DLU/MEDQ  D:  12/31/2001  T:  12/31/2001  Job:  [id number]   cc:   Talmadge Coventry, M.D.

## 2010-11-01 ENCOUNTER — Encounter: Payer: Self-pay | Admitting: Emergency Medicine

## 2010-11-02 ENCOUNTER — Telehealth: Payer: Self-pay | Admitting: Emergency Medicine

## 2010-11-02 ENCOUNTER — Encounter: Payer: Self-pay | Admitting: Emergency Medicine

## 2010-11-02 ENCOUNTER — Ambulatory Visit (INDEPENDENT_AMBULATORY_CARE_PROVIDER_SITE_OTHER): Payer: Medicare Other | Admitting: Emergency Medicine

## 2010-11-02 DIAGNOSIS — R05 Cough: Secondary | ICD-10-CM

## 2010-11-02 MED ORDER — FEXOFENADINE HCL 180 MG PO TABS: 180.0000 mg | ORAL_TABLET | Freq: Every day | ORAL | Status: DC

## 2010-11-02 NOTE — Assessment & Plan Note (Signed)
Believe that this is due to same exacerbating factors as before. Need to get on rx consistently - continue fexofenadine - continue fluticasone nose spray bid - increase the NSW to bid  - increase nexium to bid for 2 weeks then go back to qd - consider swallow eval or better eval of her hiatal hernia.  - rov 1 month

## 2010-11-02 NOTE — Patient Instructions (Signed)
Continue fexofenadine daily Increase fluticasone nose spray to 2 sprays each nostril twice a day Increase the Nasal saline washes to twice a day Increase nexium to twice a day for 2 weeks then go back to daily Follow up with Dr Delton Coombes in 1 month

## 2010-11-02 NOTE — Telephone Encounter (Signed)
Spoke with pt and she states will need rx for fexofenadine if RB wants her to continue. Rx sent to pharm.

## 2010-11-02 NOTE — Progress Notes (Signed)
77 yo female with Chronic rhinitis, chronic cough, and GERD, hiatal hernia.   ROV 11/27/08 -- follows up for her cough. Much improved after restarting NSW's and omeprazole. Has also been taking claritin + nasonex. Also stopped Symbicort, has not missed it.   ROV 06/01/09 -- Returns for f/u. Stopped Nasonex, still on loratadine. Still doing NSWs. Stopped omeprazole last month, hasn't really missed. Not on any BD's.   ROV 11/02/10 -- Pleasant 77 yo woman, hx GERD, chronic rhinitis, cough.  She returns today after she had return of her cough in May '12. She has noticed some choking  - both on her saliva and when eating/drinking.  She went off her omeprazole in May because she was doing well.  The cough started back so she restarted. Cough continued, seen by Dr Duanne Guess and started nasal steroid, changed omeprazole to Nexium. She also restarted NSW at that time in June. Cough productive of thick clear phlegm for the last 2 weeks.    Gen: Pleasant, well-nourished, in no distress,  normal affect  ENT: No lesions,  mouth clear,  oropharynx clear, no postnasal drip  Neck: No JVD, no TMG, no carotid bruits  Lungs: No use of accessory muscles, no dullness to percussion, clear without rales or rhonchi  Cardiovascular: RRR, heart sounds normal, no murmur or gallops, no peripheral edema  Musculoskeletal: No deformities, no cyanosis or clubbing  Neuro: alert, non focal  Skin: Warm, no lesions or rashes  COUGH, CHRONIC Believe that this is due to same exacerbating factors as before. Need to get on rx consistently - continue fexofenadine - continue fluticasone nose spray bid - increase the NSW to bid  - increase nexium to bid for 2 weeks then go back to qd - consider swallow eval or better eval of her hiatal hernia.  - rov 1 month

## 2010-11-08 ENCOUNTER — Other Ambulatory Visit: Payer: Self-pay | Admitting: *Deleted

## 2010-11-08 DIAGNOSIS — K219 Gastro-esophageal reflux disease without esophagitis: Secondary | ICD-10-CM

## 2010-11-08 MED ORDER — ESOMEPRAZOLE MAGNESIUM 40 MG PO CPDR: 40.0000 mg | DELAYED_RELEASE_CAPSULE | Freq: Every day | ORAL | Status: DC

## 2010-11-09 ENCOUNTER — Telehealth: Payer: Self-pay | Admitting: Emergency Medicine

## 2010-11-09 NOTE — Telephone Encounter (Signed)
Per MW- needs ov, ED sooner if necc.  Spoke with pt and notified of recs and sched ov with TP for tomorrow am at 9:30

## 2010-11-09 NOTE — Telephone Encounter (Signed)
Pt c/o increased cough with green phlegm and says the mucus is so thick it chokes her. She denies any sore throat, f/c/s or wheezing.She says she is following all of Dr. Kavin Leech instructions from her OV on 9/11 but this doesn't seem to be helping. Dr. Delton Coombes is not in the office this afternoon and I will forward the msg to Dr. Sherene Sires for recs. Pls advise. Allergies  Allergen Reactions  . Codeine   . Erythromycin

## 2010-11-10 ENCOUNTER — Ambulatory Visit (INDEPENDENT_AMBULATORY_CARE_PROVIDER_SITE_OTHER): Payer: Medicare Other | Admitting: Adult Health

## 2010-11-10 ENCOUNTER — Encounter: Payer: Self-pay | Admitting: Adult Health

## 2010-11-10 DIAGNOSIS — R059 Cough, unspecified: Secondary | ICD-10-CM

## 2010-11-10 DIAGNOSIS — R05 Cough: Secondary | ICD-10-CM

## 2010-11-10 MED ORDER — HYDROCODONE-HOMATROPINE 5-1.5 MG/5ML PO SYRP: 5.0000 mL | ORAL_SOLUTION | Freq: Four times a day (QID) | ORAL | Status: DC | PRN

## 2010-11-10 MED ORDER — ESOMEPRAZOLE MAGNESIUM 40 MG PO CPDR: 40.0000 mg | DELAYED_RELEASE_CAPSULE | Freq: Two times a day (BID) | ORAL | Status: DC

## 2010-11-10 MED ORDER — AMOXICILLIN-POT CLAVULANATE 875-125 MG PO TABS: 1.0000 | ORAL_TABLET | Freq: Two times a day (BID) | ORAL | Status: DC

## 2010-11-10 MED ORDER — PREDNISONE 10 MG PO TABS: ORAL_TABLET | ORAL | Status: DC

## 2010-11-10 NOTE — Patient Instructions (Signed)
Continue fexofenadine daily Add Chlor tabs 4mg  2 tabs At bedtime  For drainage  Continue on  fluticasone nose spray to 2 sprays each nostril twice a day Nasal saline washes to twice a day Increase nexium to twice a day  Before meal  Add Pepcid 20mg  At bedtime   Prednisone 20mg  taper over next week.  Augmentin 875mg  Twice daily  For 7 days -take with food, eat yogurt while on this  Mucinex DM Twice daily  As needed  Cough  Hydromet 1/2 -1 tsp Three times a day  As needed  Cough -this will make you sleepy  Follow up with Dr Delton Coombes or Sheridan Gettel in 1 week.  Rest and fluids  Please contact office for sooner follow up if symptoms do not improve or worsen or seek emergency care

## 2010-11-10 NOTE — Assessment & Plan Note (Signed)
Slow to resolve flare with associated bronchitis and rhinitis  May need referral to GI if not improving on return   Plan;  Continue fexofenadine daily Add Chlor tabs 4mg  2 tabs At bedtime  For drainage  Continue on  fluticasone nose spray to 2 sprays each nostril twice a day Nasal saline washes to twice a day Increase nexium to twice a day  Before meal  Add Pepcid 20mg  At bedtime   Prednisone 20mg  taper over next week.  Augmentin 875mg  Twice daily  For 7 days -take with food, eat yogurt while on this  Mucinex DM Twice daily  As needed  Cough  Hydromet 1/2 -1 tsp Three times a day  As needed  Cough -this will make you sleepy  Follow up with Dr Delton Coombes or [place] in 1 week.  Rest and fluids  Please contact office for sooner follow up if symptoms do not improve or worsen or seek emergency care

## 2010-11-10 NOTE — Progress Notes (Signed)
77 yo female with Chronic rhinitis, chronic cough, and GERD, hiatal hernia. 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   ROV 11/27/08 -- follows up for her cough. Much improved after restarting NSW's and omeprazole. Has also been taking claritin + nasonex. Also stopped Symbicort, has not missed it.   ROV 06/01/09 -- Returns for f/u. Stopped Nasonex, still on loratadine. Still doing NSWs. Stopped omeprazole last month, hasn't really missed. Not on any BD's.   ROV 11/02/10 -- Pleasant 77 yo woman, hx GERD, chronic rhinitis, cough.  She returns today after she had return of her cough in May '12. She has noticed some choking  - both on her saliva and when eating/drinking.  She went off her omeprazole in May because she was doing well.  The cough started back so she restarted. Cough continued, seen by Dr Duanne Guess and started nasal steroid, changed omeprazole to Nexium. She also restarted NSW at that time in June. Cough productive of thick clear phlegm for the last 2 weeks.  >>REC: Nexium Twice daily  , cont allegra, flonase   11/10/2010 Acute OV  PT returns for persistent cough. Seen last week, tx w/ allegra and flonase along with increased PPI Twice daily  Dosing for possible reflux irritation.  Cough all during day and night. Feels something caught in throat at times. Cant get mucus up at times. Lots of throat clearing. Coughing up green mucus. Lots of nasal drainage. No sinus pain or pressure. Severe coughing fits.  Has hx of Hiatal hernia and GERD followed by Dr Ewing Schlein. ? Hx of intermittent Dysphagia.  Leaving for Vacation for 2 weeks to [place] next week.   ROS:  Constitutional:   No  weight loss, night sweats,  Fevers, chills, fatigue, or  lassitude.  HEENT:   No headaches,  Difficulty swallowing,  Tooth/dental problems,                 + sneezing, itching,  , nasal congestion, post nasal drip,   CV:  No chest pain,  Orthopnea, PND, swelling in lower extremities, anasarca, dizziness,  palpitations, syncope.   GI  No heartburn, indigestion, abdominal pain, nausea, vomiting, diarrhea, change in bowel habits, loss of appetite, bloody stools.   Resp:    ,  No coughing up of blood.    No wheezing.  No chest wall deformity  Skin: no rash or lesions.  GU: no dysuria, change in color of urine, no urgency or frequency.  No flank pain, no hematuria   MS:  No joint pain or swelling.  No decreased range of motion.  No back pain.  Psych:  No change in mood or affect. No depression or anxiety.  No memory loss.    PE:  Gen: Pleasant, well-nourished, in no distress,  normal affect  ENT: No lesions,  mouth clear,  oropharynx clear, no postnasal drip  Neck: No JVD, no TMG, no carotid bruits  Lungs: No use of accessory muscles, no dullness to percussion, coarse BS w/ no wheezes   Cardiovascular: RRR, heart sounds normal, no murmur or gallops, no peripheral edema  Musculoskeletal: No deformities, no cyanosis or clubbing  Neuro: alert, non focal  Skin: Warm, no lesions or rashes

## 2010-11-15 ENCOUNTER — Telehealth: Payer: Self-pay | Admitting: Adult Health

## 2010-11-15 NOTE — Telephone Encounter (Signed)
Pt recently started on Nexium 40 mg for twice daily therapy and this needs PA. Called [employer] at [phone] to initiate Pa. Member # [id number]. Questions answered by phone and this is now under clinical review. Ref # [id number]. Will await response within 24 to 48 hours.

## 2010-11-16 ENCOUNTER — Encounter: Payer: Self-pay | Admitting: Adult Health

## 2010-11-16 ENCOUNTER — Ambulatory Visit (INDEPENDENT_AMBULATORY_CARE_PROVIDER_SITE_OTHER)
Admission: RE | Admit: 2010-11-16 | Discharge: 2010-11-16 | Disposition: A | Discharge status: Home / Self Care | Payer: Medicare Other | Source: Ambulatory Visit | Attending: Adult Health | Admitting: Adult Health

## 2010-11-16 ENCOUNTER — Ambulatory Visit (INDEPENDENT_AMBULATORY_CARE_PROVIDER_SITE_OTHER): Payer: Medicare Other | Admitting: Adult Health

## 2010-11-16 VITALS — BP 130/78 | HR 84 | Temp 97.4°F | Ht 64.25 in | Wt 152.4 lb

## 2010-11-16 DIAGNOSIS — R05 Cough: Secondary | ICD-10-CM

## 2010-11-16 DIAGNOSIS — R059 Cough, unspecified: Secondary | ICD-10-CM

## 2010-11-16 MED ORDER — HYDROCODONE-HOMATROPINE 5-1.5 MG/5ML PO SYRP: 5.0000 mL | ORAL_SOLUTION | Freq: Four times a day (QID) | ORAL | Status: AC | PRN

## 2010-11-16 NOTE — Assessment & Plan Note (Signed)
Resolving cyclical cough flare,  Will get xray today (did not get last ov)   Plan:  Continue fexofenadine daily  Chlor tabs 4mg  2 tabs At bedtime  For drainage  Continue on  fluticasone nose spray to 2 sprays each nostril twice a day Nasal saline washes to twice a day nexium  twice a day  Before meal  Pepcid 20mg  At bedtime   Mucinex DM Twice daily  As needed  Cough  Hydromet 1/2 -1 tsp Three times a day  As needed  Cough -this will make you sleepy  Follow up with Dr Delton Coombes in 4 weeks  I will call on cell with xray results.  Rest and fluids  Please contact office for sooner follow up if symptoms do not improve or worsen or seek emergency care

## 2010-11-16 NOTE — Telephone Encounter (Signed)
Nexium APPROVED for twice daily until 05/14/2011. Pt and pharmacy have been notified.

## 2010-11-16 NOTE — Progress Notes (Signed)
77 yo female with Chronic rhinitis, chronic cough, and GERD, hiatal hernia. 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   ROV 11/27/08 -- follows up for her cough. Much improved after restarting NSW's and omeprazole. Has also been taking claritin + nasonex. Also stopped Symbicort, has not missed it.   ROV 06/01/09 -- Returns for f/u. Stopped Nasonex, still on loratadine. Still doing NSWs. Stopped omeprazole last month, hasn't really missed. Not on any BD's.   ROV 11/02/10 -- Pleasant 77 yo woman, hx GERD, chronic rhinitis, cough.  She returns today after she had return of her cough in May '12. She has noticed some choking  - both on her saliva and when eating/drinking.  She went off her omeprazole in May because she was doing well.  The cough started back so she restarted. Cough continued, seen by Dr Duanne Guess and started nasal steroid, changed omeprazole to Nexium. She also restarted NSW at that time in June. Cough productive of thick clear phlegm for the last 2 weeks.  >>REC: Nexium Twice daily  , cont allegra, flonase   11/10/2010 Acute OV  PT returns for persistent cough. Seen last week, tx w/ allegra and flonase along with increased PPI Twice daily  Dosing for possible reflux irritation.  Cough all during day and night. Feels something caught in throat at times. Cant get mucus up at times. Lots of throat clearing. Coughing up green mucus. Lots of nasal drainage. No sinus pain or pressure. Severe coughing fits.  Has hx of Hiatal hernia and GERD followed by Dr Ewing Schlein. ? Hx of intermittent Dysphagia.  Leaving for Vacation for 2 weeks to [facility] next week.  >>aggressive GERD /rhinitis prevention with cough suppressant, steroid burst and augmentin x 7d  11/16/2010 Follow up  Pt returns for follow up. Last ov with persistent cough. Tx for cyclical cough and bronchitis with aggressive GERD/AR prevention with cough suppressants , prednisone taper and augmentin x 7 d.  She is feeling better but cough is not  totally gone. Still has raspy voice and drainage in throat.   No fever or discolored mucus. She is leaving for 2 week vacation to [place] and cruise.     ROS:  Constitutional:   No  weight loss, night sweats,  Fevers, chills, fatigue, or  lassitude.  HEENT:   No headaches,  Difficulty swallowing,  Tooth/dental problems,                 + sneezing, itching,  , nasal congestion, post nasal drip,   CV:  No chest pain,  Orthopnea, PND, swelling in lower extremities, anasarca, dizziness, palpitations, syncope.   GI  No heartburn, indigestion, abdominal pain, nausea, vomiting, diarrhea, change in bowel habits, loss of appetite, bloody stools.   Resp:    ,  No coughing up of blood.    No wheezing.  No chest wall deformity  Skin: no rash or lesions.  GU: no dysuria, change in color of urine, no urgency or frequency.  No flank pain, no hematuria   MS:  No joint pain or swelling.  No decreased range of motion.  No back pain.  Psych:  No change in mood or affect. No depression or anxiety.  No memory loss.    PE:  Gen: Pleasant, well-nourished, in no distress,  normal affect  ENT: No lesions,  mouth clear,  oropharynx clear, no postnasal drip  Neck: No JVD, no TMG, no carotid bruits  Lungs: No use of accessory  muscles, no dullness to percussion, coarse BS w/ no wheezes   Cardiovascular: RRR, heart sounds normal, no murmur or gallops, no peripheral edema  Musculoskeletal: No deformities, no cyanosis or clubbing  Neuro: alert, non focal  Skin: Warm, no lesions or rashes

## 2010-11-16 NOTE — Patient Instructions (Addendum)
Continue fexofenadine daily  Chlor tabs 4mg  2 tabs At bedtime  For drainage  Continue on  fluticasone nose spray to 2 sprays each nostril twice a day Nasal saline washes to twice a day nexium  twice a day  Before meal  Pepcid 20mg  At bedtime   Mucinex DM Twice daily  As needed  Cough  Hydromet 1/2 -1 tsp Three times a day  As needed  Cough -this will make you sleepy  Follow up with Dr Delton Coombes in 4 weeks  I will call on cell with xray results.  Rest and fluids  Please contact office for sooner follow up if symptoms do not improve or worsen or seek emergency care

## 2010-12-14 ENCOUNTER — Other Ambulatory Visit: Payer: Self-pay | Admitting: Gastroenterology

## 2010-12-20 ENCOUNTER — Other Ambulatory Visit (HOSPITAL_COMMUNITY): Payer: Self-pay | Admitting: Emergency Medicine

## 2010-12-20 ENCOUNTER — Ambulatory Visit (INDEPENDENT_AMBULATORY_CARE_PROVIDER_SITE_OTHER): Payer: Medicare Other | Admitting: Emergency Medicine

## 2010-12-20 ENCOUNTER — Telehealth: Payer: Self-pay | Admitting: Emergency Medicine

## 2010-12-20 ENCOUNTER — Encounter: Payer: Self-pay | Admitting: Emergency Medicine

## 2010-12-20 VITALS — BP 142/80 | HR 94 | Temp 98.4°F | Ht 64.25 in | Wt 149.0 lb

## 2010-12-20 DIAGNOSIS — R05 Cough: Secondary | ICD-10-CM

## 2010-12-20 NOTE — Assessment & Plan Note (Signed)
-   continue fluticasone and allegra - add back daily NSW - continue Nexium - referral to speech therapy for modified barium and swallowing techniques.  - ROV 6 months

## 2010-12-20 NOTE — Patient Instructions (Signed)
Continue your nexium, fluticasone spray and allegra Start doing nasal saline washes every day We will set up a swallowing evaluation to insure that you aren't getting choked on food, drink.  Follow with Dr Delton Coombes in 6 months or sooner if you have any problems.

## 2010-12-20 NOTE — Progress Notes (Signed)
77 yo female with Chronic rhinitis, chronic cough, and GERD, hiatal hernia. 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   ROV 75/7/10 -- follows up for her cough. Much improved after restarting NSW's and omeprazole. Has also been taking claritin + nasonex. Also stopped Symbicort, has not missed it.   ROV 75/11/11 -- Returns for f/u. Stopped Nasonex, still on loratadine. Still doing NSWs. Stopped omeprazole last month, hasn't really missed. Not on any BD's.   ROV 11/02/10 -- Pleasant 77 yo woman, hx GERD, chronic rhinitis, cough.  She returns today after she had return of her cough in May '12. She has noticed some choking  - both on her saliva and when eating/drinking.  She went off her omeprazole in May because she was doing well.  The cough started back so she restarted. Cough continued, seen by Dr Duanne Guess and started nasal steroid, changed omeprazole to Nexium. She also restarted NSW at that time in June. Cough productive of thick clear phlegm for the last 2 weeks.  >>REC: Nexium Twice daily  , cont allegra, flonase   11/10/2010 Acute OV  PT returns for persistent cough. Seen last week, tx w/ allegra and flonase along with increased PPI Twice daily  Dosing for possible reflux irritation.  Cough all during day and night. Feels something caught in throat at times. Cant get mucus up at times. Lots of throat clearing. Coughing up green mucus. Lots of nasal drainage. No sinus pain or pressure. Severe coughing fits.  Has hx of Hiatal hernia and GERD followed by Dr Ewing Schlein. ? Hx of intermittent Dysphagia.  Leaving for Vacation for 2 weeks to [facility] next week.  >>aggressive GERD /rhinitis prevention with cough suppressant, steroid burst and augmentin x 7d  11/16/2010 Follow up  Pt returns for follow up. Last ov with persistent cough. Tx for cyclical cough and bronchitis with aggressive GERD/AR prevention with cough suppressants , prednisone taper and augmentin x 7 d.  She is feeling better but cough is not  totally gone. Still has raspy voice and drainage in throat.   No fever or discolored mucus. She is leaving for 2 week vacation to [place] and cruise.   ROV 12/20/10 -- Hx chronic cough, GERD/hiatal hernia, rhinitis. Seen as above in Sept for flaring UA disease and cough. She was treated with pred taper and abx.  Doing fluticasone, allegra. Not using NSW every day. She is on Nexium. Recent EGD showed gastric polyps, no path results back yet. Her cough is about the same. She is getting choked when she eats and drinks.    PE:  Gen: Pleasant, well-nourished, in no distress,  normal affect  ENT: No lesions,  mouth clear,  oropharynx clear, no postnasal drip  Neck: No JVD, no TMG, no carotid bruits  Lungs: No use of accessory muscles, no dullness to percussion, coarse BS w/ no wheezes   Cardiovascular: RRR, heart sounds normal, no murmur or gallops, no peripheral edema  Musculoskeletal: No deformities, no cyanosis or clubbing  Neuro: alert, non focal  Skin: Warm, no lesions or rashes  COUGH, CHRONIC - continue fluticasone and allegra - add back daily NSW - continue Nexium - referral to speech therapy for modified barium and swallowing techniques.  - ROV 6 months

## 2010-12-20 NOTE — Progress Notes (Signed)
Addended by: Michel Bickers A on: 12/20/2010 03:27 PM   Modules accepted: Orders

## 2010-12-24 ENCOUNTER — Telehealth: Payer: Self-pay | Admitting: Emergency Medicine

## 2010-12-24 NOTE — Telephone Encounter (Signed)
Called and spoke with [name] at [employer] at [facility].  She states she has an order for pt to have a Mod. Barium Swallow scheduled for 12/29/10.  [name] is just calling to verify this is the correct order that RB wants.  She states the wrong order has been ordered in the past and she just wants to verify this is correct. Also wanted RB to be aware pt had a swallow study done 09/2004 and 2002 and dilation 11/2004.   Informed [name] that RB is out of office today and will come back on Monday and she stated she is ok to wait until Monday to have message addressed.  RB, please advise.  Thanks.

## 2010-12-27 NOTE — Telephone Encounter (Signed)
[name] advsie that RB does want modified barium swallow with speech therapy. Carron Curie, CMA

## 2010-12-27 NOTE — Telephone Encounter (Signed)
Paged number that was given to advise. Carron Curie, CMA

## 2010-12-27 NOTE — Telephone Encounter (Signed)
Yes - modified barrium swallow with speech therapy

## 2010-12-27 NOTE — Telephone Encounter (Signed)
See note from 12-24-10. Carron Curie, CMA

## 2010-12-29 ENCOUNTER — Ambulatory Visit (HOSPITAL_COMMUNITY)
Admission: RE | Admit: 2010-12-29 | Discharge: 2010-12-29 | Disposition: A | Discharge status: Home / Self Care | Payer: Medicare Other | Source: Ambulatory Visit | Attending: Emergency Medicine | Admitting: Emergency Medicine

## 2010-12-29 DIAGNOSIS — R05 Cough: Secondary | ICD-10-CM

## 2010-12-29 DIAGNOSIS — R1310 Dysphagia, unspecified: Secondary | ICD-10-CM | POA: Insufficient documentation

## 2010-12-29 NOTE — Procedures (Signed)
Modified Barium Swallow Procedure Note Patient Details  Name: Robin Harrell MRN: [id number] Date of Birth: February 17, 1934  Today's Date: 12/29/2010 Time:1001-1120  -     Past Medical History:  Past Medical History  Diagnosis Date  . Allergic rhinitis   . DVT (deep venous thrombosis)   . HTN (hypertension)   . PND (post-nasal drip)   . GERD (gastroesophageal reflux disease)   . Cough   . Difficulty in swallowing     w/ occasional aspiration   HPI:     Recommendation/Prognosis  Clinical Impression Dysphagia Diagnosis: Within Functional Limits;Mild cervical esophageal phase dysphagia;Suspected primary esophageal dysphagia Clinical impression: Suspect pt has primary esophageal dysphagia as previously diagnosed.  No aspiration or penetration observed during testing.  Pt observed to cough and throat clear throughout study, suspect esophageal source.  Oropharyngeal swallow appeared timely and strong.  Intermittent appearance of prominent cricopharyngeus noted when drinking thin liquid.  Suspect problems swallowing pills is from primary esophageal issues with ? cervical esophageal deficits, ? prominent cp and dysmotility contributing.  Pt's score on Reflux symptom index was 35/45, indicating high likelihood of laryngopharyngeal reflux.  Provided pt with reflux diet, reflux precautions (given diagnosis of reflux and per pt request) and compensatory strategies to ameliorate dysphagia.        General:  Date of Onset: 12/29/10 Other Pertinent Information: Pt referred by Dr Delton Coombes for evaluation of swallow due to pt complaint of dysphagia characterized by coughing with food and liquid for years that occurs episodically.  Pt had endoscopy 2 weeks ago with polyp removal per her report, prevous modified barium swallow 2006 with questionable minimal stricture at GE juncture and tertiary contractions per radiologist Dr Frazier Richards with subsequent dilatation completed revealing spasm, no stricture or ring,  minimal gastritis per Dr Ewing Schlein.  PMH also + for allergies,  GERD, chronic rhinitis, hiatal hernia, HTN, DVT.  Pt medication list includes nexium BID.  Pt denies significant weight loss nor pulmonary infections.  Appeared with slight tremor in hand which she states has been present for a few years, no significant neurological history.  Problems swallowing pills reported with large pills "coming back up" into mouth from throat.  Pt has modified pill intake by using chewables if able.    Meats and breads cause primary problems, but pt acknowledges "choking" on saliva as well.  Pt acknowledges she will clear her throat and expectorate thick clear secretions throughout the day, chewing gum helps to decrease amount of secretions.  Pt reports dysphagia has not worsened or improved since onset a few years ago.   Type of Study: Initial MBS Diet Prior to this Study: Regular;Thin liquids Oral Cavity - Dentition: Adequate natural dentition Oral Motor - (decr palatal elevation with vocalization but ok when stimulation with tongue blade) Baseline Vocal Quality: (Initially clear but became dysphonic during MBS after coughing episodes) Volitional Cough: Strong Volitional Swallow: Able to elicit   Reason for Referral: Dysphagia  Oral Phase  WFL   Pharyngeal Phase  WFL   Cervical Esophageal Phase   Thin Cup: Prominent cricopharyngeal segment; not consistently viewed Puree: Appearance of delayed clearance below UES  Mechanical Soft: Appearance of delayed clearance below UES without pt awareness Radiologist not present to confirm.   Chales Abrahams 12/29/2010, 5:45 PM

## 2011-01-06 ENCOUNTER — Telehealth: Payer: Self-pay | Admitting: Emergency Medicine

## 2011-01-06 NOTE — Telephone Encounter (Signed)
RB, pt requesting swallow test results. Please advise, thanks!

## 2011-01-07 NOTE — Telephone Encounter (Signed)
PT CALLED BACK AGAIN- WANTS TO HEAR BACK TODAY PLEASE. [phone]. Robin Harrell

## 2011-01-10 NOTE — Telephone Encounter (Signed)
I do not see results in EPIC, I called [phone] and spoke with [name] and she advised she will have someone fax results over ASAP.

## 2011-01-10 NOTE — Telephone Encounter (Signed)
Results received and placed RB's "look at".

## 2011-01-14 NOTE — Telephone Encounter (Signed)
Called pt to review, left message on machine - she has prob esophageal phase dysphagia and reflux without overt aspiration. i will call her back next week to see if she has questions.

## 2011-01-18 NOTE — Telephone Encounter (Signed)
Pt is calling back for swallow results and says she can be reached at mobile number most of the time, [phone]. She is aware RB is not in the office this week. Will forward msg back to RB so he may contact the patient.

## 2011-01-18 NOTE — Telephone Encounter (Signed)
Pt has returned RB's call & asked to be reached on her cell phone, [phone].  Robin Harrell

## 2011-01-26 ENCOUNTER — Other Ambulatory Visit (HOSPITAL_COMMUNITY): Payer: Self-pay | Admitting: Gastroenterology

## 2011-01-26 DIAGNOSIS — IMO0001 Reserved for inherently not codable concepts without codable children: Secondary | ICD-10-CM

## 2011-01-26 NOTE — Telephone Encounter (Signed)
Returning Dr Kavin Leech call can be reached at [phone] or [phone].Robin Harrell

## 2011-01-31 ENCOUNTER — Ambulatory Visit (HOSPITAL_COMMUNITY)
Admission: RE | Admit: 2011-01-31 | Discharge: 2011-01-31 | Disposition: A | Discharge status: Home / Self Care | Payer: Medicare Other | Source: Ambulatory Visit | Attending: Gastroenterology | Admitting: Gastroenterology

## 2011-01-31 DIAGNOSIS — IMO0001 Reserved for inherently not codable concepts without codable children: Secondary | ICD-10-CM

## 2011-01-31 DIAGNOSIS — R1310 Dysphagia, unspecified: Secondary | ICD-10-CM | POA: Insufficient documentation

## 2011-01-31 NOTE — Telephone Encounter (Signed)
Pt is aware RB has left the office for the day and will be back later this week. She says the best # to reach her at is her cell # [phone]. I did offer to bring her in for an OV and she said she did not have time to do that. I will forward the msg back to RB so he may try to contact her again.

## 2011-01-31 NOTE — Telephone Encounter (Signed)
Tried to call patient back at both numbers - no answer, left message. Will try her again

## 2011-02-02 ENCOUNTER — Other Ambulatory Visit: Payer: Self-pay | Admitting: Family Medicine

## 2011-02-02 DIAGNOSIS — Z780 Asymptomatic menopausal state: Secondary | ICD-10-CM

## 2011-02-07 NOTE — Telephone Encounter (Signed)
Lori please sign off on this for RB if this has been taken care of. Thanks.

## 2011-02-08 ENCOUNTER — Ambulatory Visit
Admission: RE | Admit: 2011-02-08 | Discharge: 2011-02-08 | Disposition: A | Discharge status: Home / Self Care | Payer: Medicare Other | Source: Ambulatory Visit | Attending: Family Medicine | Admitting: Family Medicine

## 2011-02-08 DIAGNOSIS — Z780 Asymptomatic menopausal state: Secondary | ICD-10-CM

## 2011-02-08 NOTE — Telephone Encounter (Signed)
Discussed swallow eval w pt. She understands test results. Tells me that she is having epistaxis lately - I asked her to decrease flonase to 1 spray each side bid, call us back if no better

## 2011-05-03 DIAGNOSIS — I5189 Other ill-defined heart diseases: Secondary | ICD-10-CM | POA: Insufficient documentation

## 2011-05-10 ENCOUNTER — Other Ambulatory Visit (HOSPITAL_COMMUNITY): Payer: Self-pay | Admitting: Family Medicine

## 2011-05-10 ENCOUNTER — Other Ambulatory Visit: Payer: Self-pay | Admitting: Family Medicine

## 2011-05-10 DIAGNOSIS — Z1231 Encounter for screening mammogram for malignant neoplasm of breast: Secondary | ICD-10-CM

## 2011-06-07 ENCOUNTER — Ambulatory Visit
Admission: RE | Admit: 2011-06-07 | Discharge: 2011-06-07 | Disposition: A | Discharge status: Home / Self Care | Payer: Medicare Other | Source: Ambulatory Visit | Attending: Family Medicine | Admitting: Family Medicine

## 2011-06-07 DIAGNOSIS — Z1231 Encounter for screening mammogram for malignant neoplasm of breast: Secondary | ICD-10-CM

## 2011-06-21 ENCOUNTER — Ambulatory Visit (INDEPENDENT_AMBULATORY_CARE_PROVIDER_SITE_OTHER): Payer: Medicare Other | Admitting: Emergency Medicine

## 2011-06-21 ENCOUNTER — Encounter: Payer: Self-pay | Admitting: Emergency Medicine

## 2011-06-21 VITALS — BP 110/68 | HR 52 | Temp 98.1°F | Ht 64.25 in | Wt 140.4 lb

## 2011-06-21 DIAGNOSIS — J310 Chronic rhinitis: Secondary | ICD-10-CM

## 2011-06-21 DIAGNOSIS — K219 Gastro-esophageal reflux disease without esophagitis: Secondary | ICD-10-CM

## 2011-06-21 MED ORDER — OMEPRAZOLE 20 MG PO CPDR: 20.0000 mg | DELAYED_RELEASE_CAPSULE | Freq: Two times a day (BID) | ORAL | Status: DC

## 2011-06-21 NOTE — Patient Instructions (Signed)
Please continue your fluticasone nasal spray and fexofenadine Restart your nasal saline washes every day We will change your nexium to omeprazole 20mg  twice a day Try to stop clearing your throat  Continue your swallowing precautions.  Follow with Dr Delton Coombes in 6 months or sooner if you have any problems

## 2011-06-21 NOTE — Progress Notes (Signed)
78 yo female with Chronic rhinitis, chronic cough, and GERD, hiatal hernia. 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   ROV 11/27/08 -- follows up for her cough. Much improved after restarting NSW's and omeprazole. Has also been taking claritin + nasonex. Also stopped Symbicort, has not missed it.   ROV 06/01/09 -- Returns for f/u. Stopped Nasonex, still on loratadine. Still doing NSWs. Stopped omeprazole last month, hasn't really missed. Not on any BD's.   ROV 11/02/10 -- Pleasant 78 yo woman, hx GERD, chronic rhinitis, cough.  She returns today after she had return of her cough in May '12. She has noticed some choking  - both on her saliva and when eating/drinking.  She went off her omeprazole in May because she was doing well.  The cough started back so she restarted. Cough continued, seen by Dr Duanne Guess and started nasal steroid, changed omeprazole to Nexium. She also restarted NSW at that time in June. Cough productive of thick clear phlegm for the last 2 weeks.  >>REC: Nexium Twice daily  , cont allegra, flonase   11/10/2010 Acute OV  PT returns for persistent cough. Seen last week, tx w/ allegra and flonase along with increased PPI Twice daily  Dosing for possible reflux irritation.  Cough all during day and night. Feels something caught in throat at times. Cant get mucus up at times. Lots of throat clearing. Coughing up green mucus. Lots of nasal drainage. No sinus pain or pressure. Severe coughing fits.  Has hx of Hiatal hernia and GERD followed by Dr Ewing Schlein. ? Hx of intermittent Dysphagia.  Leaving for Vacation for 2 weeks to [facility] next week.  >>aggressive GERD /rhinitis prevention with cough suppressant, steroid burst and augmentin x 7d  11/16/2010 Follow up  Pt returns for follow up. Last ov with persistent cough. Tx for cyclical cough and bronchitis with aggressive GERD/AR prevention with cough suppressants , prednisone taper and augmentin x 7 d.  She is feeling better but cough is not  totally gone. Still has raspy voice and drainage in throat.   No fever or discolored mucus. She is leaving for 2 week vacation to [place] and cruise.   ROV 12/20/10 -- Hx chronic cough, GERD/hiatal hernia, rhinitis. Seen as above in Sept for flaring UA disease and cough. She was treated with pred taper and abx.  Doing fluticasone, allegra. Not using NSW every day. She is on Nexium. Recent EGD showed gastric polyps, no path results back yet. Her cough is about the same. She is getting choked when she eats and drinks.   ROV 06/21/11 -- Hx chronic cough, GERD/hiatal hernia, rhinitis. Hx of PE on chronic coumadin. She ran out of nexium bid ([employer]), insurance will no longer cover it. She was previously on omeprazole but it didn't work as well. She is not doing [place], she is on fluticasone and fexofenadine. She tells me that she was hospitalized in January '13 for RSV and viral PNA, ICU x 8 days, not ventilated. She is improved now after rehab stay. She has difficulty with intermittent episodes aspiration, has seen speech rx. She is having a lot of nasal gtt.   Filed Vitals:   06/21/11 1400  BP: 110/68  Pulse: 52  Temp: 98.1 F (36.7 C)   PE:  Gen: Pleasant, well-nourished, in no distress,  normal affect  ENT: No lesions,  mouth clear,  oropharynx clear, no postnasal drip  Neck: No JVD, no TMG, no carotid bruits  Lungs: No  use of accessory muscles, no dullness to percussion, coarse BS w/ no wheezes   Cardiovascular: RRR, heart sounds normal, no murmur or gallops, no peripheral edema  Musculoskeletal: No deformities, no cyanosis or clubbing  Neuro: alert, non focal  Skin: Warm, no lesions or rashes  GERD Has hiatal hernia contributing to GERD, both causing cough. Her Nexium was not paid for by [facility]. She has done worse on omeprazole, but I will retry   CHRONIC RHINITIS Add back NSW to loratadine and allegra

## 2011-06-21 NOTE — Assessment & Plan Note (Signed)
Has hiatal hernia contributing to GERD, both causing cough. Her Nexium was not paid for by [facility]. She has done worse on omeprazole, but I will retry

## 2011-06-21 NOTE — Assessment & Plan Note (Signed)
Add back NSW to loratadine and allegra

## 2011-06-30 DIAGNOSIS — M159 Polyosteoarthritis, unspecified: Secondary | ICD-10-CM | POA: Insufficient documentation

## 2011-10-11 DIAGNOSIS — G4700 Insomnia, unspecified: Secondary | ICD-10-CM | POA: Insufficient documentation

## 2011-12-19 ENCOUNTER — Encounter: Payer: Self-pay | Admitting: Emergency Medicine

## 2011-12-19 ENCOUNTER — Ambulatory Visit (INDEPENDENT_AMBULATORY_CARE_PROVIDER_SITE_OTHER): Payer: Medicare Other | Admitting: Emergency Medicine

## 2011-12-19 VITALS — BP 122/80 | HR 90 | Temp 98.2°F | Ht 62.0 in

## 2011-12-19 DIAGNOSIS — R05 Cough: Secondary | ICD-10-CM

## 2011-12-19 NOTE — Assessment & Plan Note (Signed)
Much better - she is no longer on omeprazole, ran out last week. Not doing [place]. She is interested in coming off flonase at some point./   - stay off omeprazole  - if tolerates d/c PPI then she will stop flonase in a few weeks.  - stay on the loratadine - rov 1 year

## 2011-12-19 NOTE — Patient Instructions (Addendum)
Stay off omeprazole to see if your cough returns. If not then you can do a trial off fluticasone nasal spray. If your cough returns then you may need both of the medications.  Follow with Dr Delton Coombes in 1 year or sooner if you have any problems.

## 2011-12-19 NOTE — Progress Notes (Signed)
78 yo female with Chronic rhinitis, chronic cough, and GERD, hiatal hernia. 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   ROV 11/27/08 -- follows up for her cough. Much improved after restarting NSW's and omeprazole. Has also been taking claritin + nasonex. Also stopped Symbicort, has not missed it.   ROV 06/01/09 -- Returns for f/u. Stopped Nasonex, still on loratadine. Still doing NSWs. Stopped omeprazole last month, hasn't really missed. Not on any BD's.   ROV 11/02/10 -- Pleasant 78 yo woman, hx GERD, chronic rhinitis, cough.  She returns today after she had return of her cough in May '12. She has noticed some choking  - both on her saliva and when eating/drinking.  She went off her omeprazole in May because she was doing well.  The cough started back so she restarted. Cough continued, seen by Dr Duanne Guess and started nasal steroid, changed omeprazole to Nexium. She also restarted NSW at that time in June. Cough productive of thick clear phlegm for the last 2 weeks.  >>REC: Nexium Twice daily  , cont allegra, flonase   11/10/2010 Acute OV  PT returns for persistent cough. Seen last week, tx w/ allegra and flonase along with increased PPI Twice daily  Dosing for possible reflux irritation.  Cough all during day and night. Feels something caught in throat at times. Cant get mucus up at times. Lots of throat clearing. Coughing up green mucus. Lots of nasal drainage. No sinus pain or pressure. Severe coughing fits.  Has hx of Hiatal hernia and GERD followed by Dr Ewing Schlein. ? Hx of intermittent Dysphagia.  Leaving for Vacation for 2 weeks to [facility] next week.  >>aggressive GERD /rhinitis prevention with cough suppressant, steroid burst and augmentin x 7d  11/16/2010 Follow up  Pt returns for follow up. Last ov with persistent cough. Tx for cyclical cough and bronchitis with aggressive GERD/AR prevention with cough suppressants , prednisone taper and augmentin x 7 d.  She is feeling better but cough is not  totally gone. Still has raspy voice and drainage in throat.   No fever or discolored mucus. She is leaving for 2 week vacation to [place] and cruise.   ROV 12/20/10 -- Hx chronic cough, GERD/hiatal hernia, rhinitis. Seen as above in Sept for flaring UA disease and cough. She was treated with pred taper and abx.  Doing fluticasone, allegra. Not using NSW every day. She is on Nexium. Recent EGD showed gastric polyps, no path results back yet. Her cough is about the same. She is getting choked when she eats and drinks.   ROV 06/21/11 -- Hx chronic cough, GERD/hiatal hernia, rhinitis. Hx of PE on chronic coumadin. She ran out of nexium bid ([employer]), insurance will no longer cover it. She was previously on omeprazole but it didn't work as well. She is not doing [place], she is on fluticasone and fexofenadine. She tells me that she was hospitalized in January '13 for RSV and viral PNA, ICU x 8 days, not ventilated. She is improved now after rehab stay. She has difficulty with intermittent episodes aspiration, has seen speech rx. She is having a lot of nasal gtt.   ROV 12/19/11 -- Severe COPD, LUL squamous cell CA s/p XRT. Last time we went back on omeprazole, restarted NSW, continued fluticasone. She changed fexofenadine to loratadine. She reports today that she has been sneezing. She stopped the [place] 2 months ago. Breathing doing well. She feels that her cough is much better.   Filed  Vitals:   12/19/11 1342  BP: 122/80  Pulse: 90  Temp: 98.2 F (36.8 C)   PE:  Gen: Pleasant, well-nourished, in no distress,  normal affect  ENT: No lesions,  mouth clear,  oropharynx clear, no postnasal drip  Neck: No JVD, no TMG, no carotid bruits  Lungs: No use of accessory muscles, no dullness to percussion, coarse BS w/ no wheezes   Cardiovascular: RRR, heart sounds normal, no murmur or gallops, no peripheral edema  Musculoskeletal: No deformities, no cyanosis or clubbing  Neuro: alert, non focal  Skin: Warm,  no lesions or rashes  COUGH, CHRONIC Much better - she is no longer on omeprazole, ran out last week. Not doing [place]. She is interested in coming off flonase at some point./   - stay off omeprazole  - if tolerates d/c PPI then she will stop flonase in a few weeks.  - stay on the loratadine - rov 1 year

## 2012-03-22 ENCOUNTER — Ambulatory Visit (INDEPENDENT_AMBULATORY_CARE_PROVIDER_SITE_OTHER): Payer: Medicare Other | Admitting: Emergency Medicine

## 2012-03-22 ENCOUNTER — Encounter: Payer: Self-pay | Admitting: Emergency Medicine

## 2012-03-22 VITALS — BP 104/68 | HR 75 | Temp 98.9°F | Ht 63.0 in | Wt 142.8 lb

## 2012-03-22 DIAGNOSIS — R05 Cough: Secondary | ICD-10-CM

## 2012-03-22 DIAGNOSIS — R059 Cough, unspecified: Secondary | ICD-10-CM

## 2012-03-22 MED ORDER — FLUTICASONE PROPIONATE 50 MCG/ACT NA SUSP: 2.0000 | Freq: Two times a day (BID) | NASAL | Status: DC

## 2012-03-22 NOTE — Assessment & Plan Note (Signed)
Exacerbated by flare of her chronic rhinitis. She had been able to stop the [place] and nasal steroid temporarily, will restart now, consider weaning to off again later as we are able. Defer omeprazole for now, although we may need to restart at some point

## 2012-03-22 NOTE — Progress Notes (Signed)
79 yo female with Chronic rhinitis, chronic cough, and GERD, hiatal hernia. 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   ROV 11/27/08 -- follows up for her cough. Much improved after restarting NSW's and omeprazole. Has also been taking claritin + nasonex. Also stopped Symbicort, has not missed it.   ROV 06/01/09 -- Returns for f/u. Stopped Nasonex, still on loratadine. Still doing NSWs. Stopped omeprazole last month, hasn't really missed. Not on any BD's.   ROV 11/02/10 -- Pleasant 79 yo woman, hx GERD, chronic rhinitis, cough.  She returns today after she had return of her cough in May '12. She has noticed some choking  - both on her saliva and when eating/drinking.  She went off her omeprazole in May because she was doing well.  The cough started back so she restarted. Cough continued, seen by Dr Duanne Guess and started nasal steroid, changed omeprazole to Nexium. She also restarted NSW at that time in June. Cough productive of thick clear phlegm for the last 2 weeks.  >>REC: Nexium Twice daily  , cont allegra, flonase   11/10/2010 Acute OV  PT returns for persistent cough. Seen last week, tx w/ allegra and flonase along with increased PPI Twice daily  Dosing for possible reflux irritation.  Cough all during day and night. Feels something caught in throat at times. Cant get mucus up at times. Lots of throat clearing. Coughing up green mucus. Lots of nasal drainage. No sinus pain or pressure. Severe coughing fits.  Has hx of Hiatal hernia and GERD followed by Dr Ewing Schlein. ? Hx of intermittent Dysphagia.  Leaving for Vacation for 2 weeks to [facility] next week.  >>aggressive GERD /rhinitis prevention with cough suppressant, steroid burst and augmentin x 7d  11/16/2010 Follow up  Pt returns for follow up. Last ov with persistent cough. Tx for cyclical cough and bronchitis with aggressive GERD/AR prevention with cough suppressants , prednisone taper and augmentin x 7 d.  She is feeling better but cough is not  totally gone. Still has raspy voice and drainage in throat.   No fever or discolored mucus. She is leaving for 2 week vacation to [place] and cruise.   ROV 12/20/10 -- Hx chronic cough, GERD/hiatal hernia, rhinitis. Seen as above in Sept for flaring UA disease and cough. She was treated with pred taper and abx.  Doing fluticasone, allegra. Not using NSW every day. She is on Nexium. Recent EGD showed gastric polyps, no path results back yet. Her cough is about the same. She is getting choked when she eats and drinks.   ROV 06/21/11 -- Hx chronic cough, GERD/hiatal hernia, rhinitis. Hx of PE on chronic coumadin. She ran out of nexium bid ([employer]), insurance will no longer cover it. She was previously on omeprazole but it didn't work as well. She is not doing [place], she is on fluticasone and fexofenadine. She tells me that she was hospitalized in January '13 for RSV and viral PNA, ICU x 8 days, not ventilated. She is improved now after rehab stay. She has difficulty with intermittent episodes aspiration, has seen speech rx. She is having a lot of nasal gtt.   ROV 12/19/11 -- Severe COPD, LUL squamous cell CA s/p XRT. Last time we went back on omeprazole, restarted NSW, continued fluticasone. She changed fexofenadine to loratadine. She reports today that she has been sneezing. She stopped the [place] 2 months ago. Breathing doing well. She feels that her cough is much better.   ROV  03/22/12 -- Severe COPD, LUL squamous cell CA s/p XRT. Chronic cough, components of GERD and PND.  2 weeks ago she started to have more nasal drainage. No other URI sx. She was exposed to an orchid 2 weeks ago. She is not currently on [place] or nasal steroid - we had weaned of  Filed Vitals:   03/22/12 1036  BP: 104/68  Pulse: 75  Temp: 98.9 F (37.2 C)   PE:  Gen: Pleasant, well-nourished, in no distress,  normal affect  ENT: No lesions,  mouth clear,  oropharynx clear, no postnasal drip  Neck: No JVD, no TMG, no carotid  bruits  Lungs: No use of accessory muscles, no dullness to percussion, coarse BS w/ no wheezes   Cardiovascular: RRR, heart sounds normal, no murmur or gallops, no peripheral edema  Musculoskeletal: No deformities, no cyanosis or clubbing  Neuro: alert, non focal  Skin: Warm, no lesions or rashes  COUGH, CHRONIC Exacerbated by flare of her chronic rhinitis. She had been able to stop the [place] and nasal steroid temporarily, will restart now, consider weaning to off again later as we are able. Defer omeprazole for now, although we may need to restart at some point

## 2012-03-22 NOTE — Patient Instructions (Addendum)
Restart your nasal saline washes every day Restart your fluticasone nasal spray, 2 sprays each side twice a day We may be able to stop these medications in the future when your nasal drainage quiets down Follow with Dr Delton Coombes in 4 months or sooner if you have any problems.

## 2012-05-08 ENCOUNTER — Other Ambulatory Visit: Payer: Self-pay

## 2012-05-10 ENCOUNTER — Ambulatory Visit: Payer: Self-pay | Admitting: Cardiovascular Disease

## 2012-05-10 DIAGNOSIS — Z7901 Long term (current) use of anticoagulants: Secondary | ICD-10-CM | POA: Insufficient documentation

## 2012-06-19 ENCOUNTER — Ambulatory Visit
Admission: RE | Admit: 2012-06-19 | Discharge: 2012-06-19 | Disposition: A | Discharge status: Home / Self Care | Payer: Medicare Other | Source: Ambulatory Visit

## 2012-06-19 DIAGNOSIS — Z1231 Encounter for screening mammogram for malignant neoplasm of breast: Secondary | ICD-10-CM

## 2012-07-05 ENCOUNTER — Ambulatory Visit (INDEPENDENT_AMBULATORY_CARE_PROVIDER_SITE_OTHER): Payer: Medicare Other | Admitting: Pharmacist Clinician (PhC)/ Clinical Pharmacy Specialist

## 2012-07-05 DIAGNOSIS — D6851 Activated protein C resistance: Secondary | ICD-10-CM

## 2012-07-05 DIAGNOSIS — Z7901 Long term (current) use of anticoagulants: Secondary | ICD-10-CM

## 2012-07-05 DIAGNOSIS — I82409 Acute embolism and thrombosis of unspecified deep veins of unspecified lower extremity: Secondary | ICD-10-CM

## 2012-07-05 DIAGNOSIS — D6859 Other primary thrombophilia: Secondary | ICD-10-CM

## 2012-07-17 ENCOUNTER — Encounter: Payer: Self-pay | Admitting: Emergency Medicine

## 2012-07-17 ENCOUNTER — Ambulatory Visit (INDEPENDENT_AMBULATORY_CARE_PROVIDER_SITE_OTHER): Payer: Medicare Other | Admitting: Emergency Medicine

## 2012-07-17 VITALS — BP 108/56 | HR 86 | Temp 98.4°F | Ht 62.0 in | Wt 145.4 lb

## 2012-07-17 DIAGNOSIS — J309 Allergic rhinitis, unspecified: Secondary | ICD-10-CM

## 2012-07-17 DIAGNOSIS — R05 Cough: Secondary | ICD-10-CM

## 2012-07-17 NOTE — Assessment & Plan Note (Signed)
Please continue your loratadine 10mg  daily Restart your nasal saline washes twice a day Try to stop your fluticasone nasal spray to see if you can tolerate this.  If your symptoms worsen, restart the fluticasone. Call our office to let us know.  Follow with Dr Delton Coombes in 4 months or sooner if you have any problems

## 2012-07-17 NOTE — Patient Instructions (Addendum)
Please continue your loratadine 10mg daily Restart your nasal saline washes twice a day Try to stop your fluticasone nasal spray to see if you can tolerate this.  If your symptoms worsen, restart the fluticasone. Call our office to let us know.  Follow with Dr Shariece Viveiros in 4 months or sooner if you have any problems 

## 2012-07-17 NOTE — Assessment & Plan Note (Signed)
continue to focus on GERD and PND treatment

## 2012-07-17 NOTE — Progress Notes (Signed)
79 yo female with Chronic rhinitis, chronic cough, and GERD, hiatal hernia. 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   ROV 11/27/08 -- follows up for her cough. Much improved after restarting NSW's and omeprazole. Has also been taking claritin + nasonex. Also stopped Symbicort, has not missed it.   ROV 06/01/09 -- Returns for f/u. Stopped Nasonex, still on loratadine. Still doing NSWs. Stopped omeprazole last month, hasn't really missed. Not on any BD's.   ROV 11/02/10 -- Pleasant 79 yo woman, hx GERD, chronic rhinitis, cough.  She returns today after she had return of her cough in May '12. She has noticed some choking  - both on her saliva and when eating/drinking.  She went off her omeprazole in May because she was doing well.  The cough started back so she restarted. Cough continued, seen by Dr Duanne Guess and started nasal steroid, changed omeprazole to Nexium. She also restarted NSW at that time in June. Cough productive of thick clear phlegm for the last 2 weeks.  >>REC: Nexium Twice daily  , cont allegra, flonase   11/10/2010 Acute OV  PT returns for persistent cough. Seen last week, tx w/ allegra and flonase along with increased PPI Twice daily  Dosing for possible reflux irritation.  Cough all during day and night. Feels something caught in throat at times. Cant get mucus up at times. Lots of throat clearing. Coughing up green mucus. Lots of nasal drainage. No sinus pain or pressure. Severe coughing fits.  Has hx of Hiatal hernia and GERD followed by Dr Ewing Schlein. ? Hx of intermittent Dysphagia.  Leaving for Vacation for 2 weeks to [facility] next week.  >>aggressive GERD /rhinitis prevention with cough suppressant, steroid burst and augmentin x 7d  11/16/2010 Follow up  Pt returns for follow up. Last ov with persistent cough. Tx for cyclical cough and bronchitis with aggressive GERD/AR prevention with cough suppressants , prednisone taper and augmentin x 7 d.  She is feeling better but cough is not  totally gone. Still has raspy voice and drainage in throat.   No fever or discolored mucus. She is leaving for 2 week vacation to [place] and cruise.   ROV 12/20/10 -- Hx chronic cough, GERD/hiatal hernia, rhinitis. Seen as above in Sept for flaring UA disease and cough. She was treated with pred taper and abx.  Doing fluticasone, allegra. Not using NSW every day. She is on Nexium. Recent EGD showed gastric polyps, no path results back yet. Her cough is about the same. She is getting choked when she eats and drinks.   ROV 06/21/11 -- Hx chronic cough, GERD/hiatal hernia, rhinitis. Hx of PE on chronic coumadin. She ran out of nexium bid ([employer]), insurance will no longer cover it. She was previously on omeprazole but it didn't work as well. She is not doing [place], she is on fluticasone and fexofenadine. She tells me that she was hospitalized in January '13 for RSV and viral PNA, ICU x 8 days, not ventilated. She is improved now after rehab stay. She has difficulty with intermittent episodes aspiration, has seen speech rx. She is having a lot of nasal gtt.   ROV 12/19/11 -- Severe COPD, LUL squamous cell CA s/p XRT. Last time we went back on omeprazole, restarted NSW, continued fluticasone. She changed fexofenadine to loratadine. She reports today that she has been sneezing. She stopped the [place] 2 months ago. Breathing doing well. She feels that her cough is much better.   ROV  03/22/12 -- Severe COPD, LUL squamous cell CA s/p XRT. Chronic cough, components of GERD and PND.  2 weeks ago she started to have more nasal drainage. No other URI sx. She was exposed to an orchid 2 weeks ago. She is not currently on [place] or nasal steroid - we had weaned off  ROV 07/17/12 -- Severe COPD, LUL squamous cell CA s/p XRT. Chronic cough, components of GERD and PND. She has been using [place] but stopped for a week when she went out of town. Ran out of fluticasone x 1 week also. Not sure that it has bothered her cough much. Remains  on loratadine.     Filed Vitals:   07/17/12 1417  BP: 108/56  Pulse: 86  Temp: 98.4 F (36.9 C)   PE:  Gen: Pleasant, well-nourished, in no distress,  normal affect  ENT: No lesions,  mouth clear,  oropharynx clear, no postnasal drip  Neck: No JVD, no TMG, no carotid bruits  Lungs: No use of accessory muscles, no dullness to percussion, coarse BS w/ no wheezes   Cardiovascular: RRR, heart sounds normal, no murmur or gallops, no peripheral edema  Musculoskeletal: No deformities, no cyanosis or clubbing  Neuro: alert, non focal  Skin: Warm, no lesions or rashes  ALLERGIC RHINITIS Please continue your loratadine 10mg  daily Restart your nasal saline washes twice a day Try to stop your fluticasone nasal spray to see if you can tolerate this.  If your symptoms worsen, restart the fluticasone. Call our office to let us know.  Follow with Dr Delton Coombes in 4 months or sooner if you have any problems  COUGH, CHRONIC continue to focus on GERD and PND treatment

## 2012-07-31 ENCOUNTER — Ambulatory Visit (INDEPENDENT_AMBULATORY_CARE_PROVIDER_SITE_OTHER): Payer: Medicare Other | Admitting: Pharmacist Clinician (PhC)/ Clinical Pharmacy Specialist

## 2012-07-31 VITALS — BP 116/68 | HR 72

## 2012-07-31 DIAGNOSIS — Z7901 Long term (current) use of anticoagulants: Secondary | ICD-10-CM

## 2012-07-31 DIAGNOSIS — D6859 Other primary thrombophilia: Secondary | ICD-10-CM

## 2012-07-31 DIAGNOSIS — I82409 Acute embolism and thrombosis of unspecified deep veins of unspecified lower extremity: Secondary | ICD-10-CM

## 2012-07-31 DIAGNOSIS — D6851 Activated protein C resistance: Secondary | ICD-10-CM

## 2012-07-31 LAB — POCT INR: INR: 2.4

## 2012-08-06 ENCOUNTER — Encounter: Payer: Self-pay | Admitting: Critical Care Medicine

## 2012-08-06 ENCOUNTER — Telehealth: Payer: Self-pay | Admitting: Emergency Medicine

## 2012-08-06 ENCOUNTER — Ambulatory Visit (INDEPENDENT_AMBULATORY_CARE_PROVIDER_SITE_OTHER): Payer: Medicare Other | Admitting: Critical Care Medicine

## 2012-08-06 VITALS — BP 120/68 | HR 95 | Temp 97.7°F | Ht 62.0 in | Wt 145.0 lb

## 2012-08-06 DIAGNOSIS — R05 Cough: Secondary | ICD-10-CM

## 2012-08-06 DIAGNOSIS — R059 Cough, unspecified: Secondary | ICD-10-CM

## 2012-08-06 MED ORDER — DEXTROMETHORPHAN POLISTIREX 30 MG/5ML PO LQCR: ORAL | Status: DC

## 2012-08-06 MED ORDER — CEFDINIR 300 MG PO CAPS: 600.0000 mg | ORAL_CAPSULE | Freq: Every day | ORAL | Status: DC

## 2012-08-06 MED ORDER — BENZONATATE 100 MG PO CAPS: ORAL_CAPSULE | ORAL | Status: DC

## 2012-08-06 MED ORDER — LORATADINE 10 MG PO TABS: ORAL_TABLET | ORAL | Status: DC

## 2012-08-06 NOTE — Patient Instructions (Addendum)
Hold loratidine for 7days then resume Take cefdinir 600mg  daily for 7days  Stay on neti pot twice daily Stay on flonase twice daily USe delsym 2-3 times daily for cough  Use benzonatate 1-2 every 4 hours as needed for cough Return as needed for Dr byrum

## 2012-08-06 NOTE — Telephone Encounter (Signed)
I spoke with pt. Robin Harrell had an opening at 3:00. She is scheduled at that time to be seen. Nothing further was needed

## 2012-08-06 NOTE — Progress Notes (Signed)
Subjective:    Patient ID: Robin Harrell, female    DOB: 18-Apr-1933, 79 y.o.   MRN: [id number]  HPI  79 yo female with Chronic rhinitis, chronic cough, and GERD, hiatal hernia. 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   ROV 11/27/08 -- follows up for her cough. Much improved after restarting NSW's and omeprazole. Has also been taking claritin + nasonex. Also stopped Symbicort, has not missed it.   ROV 06/01/09 -- Returns for f/u. Stopped Nasonex, still on loratadine. Still doing NSWs. Stopped omeprazole last month, hasn't really missed. Not on any BD's.   ROV 11/02/10 -- Pleasant 79 yo woman, hx GERD, chronic rhinitis, cough.  She returns today after she had return of her cough in May '12. She has noticed some choking  - both on her saliva and when eating/drinking.  She went off her omeprazole in May because she was doing well.  The cough started back so she restarted. Cough continued, seen by Dr Duanne Guess and started nasal steroid, changed omeprazole to Nexium. She also restarted NSW at that time in June. Cough productive of thick clear phlegm for the last 2 weeks.  >>REC: Nexium Twice daily  , cont allegra, flonase   11/10/2010 Acute OV  PT returns for persistent cough. Seen last week, tx w/ allegra and flonase along with increased PPI Twice daily  Dosing for possible reflux irritation.  Cough all during day and night. Feels something caught in throat at times. Cant get mucus up at times. Lots of throat clearing. Coughing up green mucus. Lots of nasal drainage. No sinus pain or pressure. Severe coughing fits.  Has hx of Hiatal hernia and GERD followed by Dr Ewing Schlein. ? Hx of intermittent Dysphagia.  Leaving for Vacation for 2 weeks to [facility] next week.  >>aggressive GERD /rhinitis prevention with cough suppressant, steroid burst and augmentin x 7d  11/16/2010 Follow up  Pt returns for follow up. Last ov with persistent cough. Tx for cyclical cough and bronchitis with aggressive GERD/AR  prevention with cough suppressants , prednisone taper and augmentin x 7 d.  She is feeling better but cough is not totally gone. Still has raspy voice and drainage in throat.   No fever or discolored mucus. She is leaving for 2 week vacation to [place] and cruise.   ROV 12/20/10 -- Hx chronic cough, GERD/hiatal hernia, rhinitis. Seen as above in Sept for flaring UA disease and cough. She was treated with pred taper and abx.  Doing fluticasone, allegra. Not using NSW every day. She is on Nexium. Recent EGD showed gastric polyps, no path results back yet. Her cough is about the same. She is getting choked when she eats and drinks.   ROV 06/21/11 -- Hx chronic cough, GERD/hiatal hernia, rhinitis. Hx of PE on chronic coumadin. She ran out of nexium bid ([employer]), insurance will no longer cover it. She was previously on omeprazole but it didn't work as well. She is not doing [place], she is on fluticasone and fexofenadine. She tells me that she was hospitalized in January '13 for RSV and viral PNA, ICU x 8 days, not ventilated. She is improved now after rehab stay. She has difficulty with intermittent episodes aspiration, has seen speech rx. She is having a lot of nasal gtt.   ROV 12/19/11 -- Severe COPD, LUL squamous cell CA s/p XRT. Last time we went back on omeprazole, restarted NSW, continued fluticasone. She changed fexofenadine to loratadine. She reports today that she  has been sneezing. She stopped the [place] 2 months ago. Breathing doing well. She feels that her cough is much better.   ROV 03/22/12 -- Severe COPD, LUL squamous cell CA s/p XRT. Chronic cough, components of GERD and PND.  2 weeks ago she started to have more nasal drainage. No other URI sx. She was exposed to an orchid 2 weeks ago. She is not currently on [place] or nasal steroid - we had weaned off  ROV 07/17/12 -- Severe COPD, LUL squamous cell CA s/p XRT. Chronic cough, components of GERD and PND. She has been using [place] but stopped for a week  when she went out of town. Ran out of fluticasone x 1 week also. Not sure that it has bothered her cough much. Remains on loratadine.     08/06/2012 Acute OV RB pt. Pt notes more cough and cotton in the throat.  Mucus is thick white to green.  No real fever. Still with pndrip.  Has to sleep in a recliner.  No real chest pain.  No real dyspnea.   Review of Systems Constitutional:   No  weight loss, night sweats,  Fevers, chills, fatigue, lassitude. HEENT:   No headaches,  Difficulty swallowing,  Tooth/dental problems,  notesSore throat,                No sneezing, itching, ear ache, notes nasal congestion,notes post nasal drip,   CV:  No chest pain,  Orthopnea, PND, swelling in lower extremities, anasarca, dizziness, palpitations  GI  No heartburn, indigestion, abdominal pain, nausea, vomiting, diarrhea, change in bowel habits, loss of appetite  Resp: No shortness of breath with exertion or at rest.  No excess mucus, notes  productive cough,  Notes  non-productive cough,  No coughing up of blood.  Notes  change in color of mucus.  No wheezing.  No chest wall deformity  Skin: no rash or lesions.  GU: no dysuria, change in color of urine, no urgency or frequency.  No flank pain.  MS:  No joint pain or swelling.  No decreased range of motion.  No back pain.  Psych:  No change in mood or affect. No depression or anxiety.  No memory loss.     Objective:   Physical Exam Filed Vitals:   08/06/12 1500  BP: 120/68  Pulse: 95  Temp: 97.7 F (36.5 C)  TempSrc: Oral  Height: 5\' 2"  (1.575 m)  Weight: 145 lb (65.772 kg)  SpO2: 97%    Gen: Pleasant, well-nourished, in no distress,  normal affect  ENT: No lesions,  mouth clear,  oropharynx clear,purulent nares   Neck: No JVD, no TMG, no carotid bruits  Lungs: No use of accessory muscles, no dullness to percussion, clear without rales or rhonchi  Cardiovascular: RRR, heart sounds normal, no murmur or gallops, no peripheral  edema  Abdomen: soft and NT, no HSM,  BS normal  Musculoskeletal: No deformities, no cyanosis or clubbing  Neuro: alert, non focal  Skin: Warm, no lesions or rashes  No results found.        Assessment & Plan:   COUGH, CHRONIC Acute sinusitis flare and chronic cyclical cough syndrome Plan Hold loratidine for 7days then resume Take cefdinir 600mg  daily for 7days  Stay on neti pot twice daily Stay on flonase twice daily USe delsym 2-3 times daily for cough  Use benzonatate 1-2 every 4 hours as needed for cough Return as needed for Dr byrum    Updated Medication  List Outpatient Encounter Prescriptions as of 08/06/2012  Medication Sig Dispense Refill  . atorvastatin (LIPITOR) 10 MG tablet Take 10 mg by mouth daily. Unsure of exact dose      . brimonidine (ALPHAGAN P) 0.1 % SOLN Place 1 drop into both eyes every 12 (twelve) hours.        . Cholecalciferol (VITAMIN D3) 2000 UNITS TABS Take 1 tablet by mouth daily.      Marland Kitchen estradiol (ESTRACE VAGINAL) 0.1 MG/GM vaginal cream Twice a week       . fluticasone (FLONASE) 50 MCG/ACT nasal spray Place 2 sprays into the nose 2 (two) times daily.  16 g  12  . latanoprost (XALATAN) 0.005 % ophthalmic solution 1 drop in each eye daily      . levothyroxine (SYNTHROID, LEVOTHROID) 25 MCG tablet Take 25 mcg by mouth daily before breakfast.      . loratadine (ALLERGY) 10 MG tablet HOLD for 7days then resume      . losartan (COZAAR) 100 MG tablet Take 100 mg by mouth daily.        . metoprolol tartrate (LOPRESSOR) 25 MG tablet Once a day      . Multiple Vitamins-Minerals (CENTRUM SILVER PO) Take 1 tablet by mouth daily.        . vitamin C (ASCORBIC ACID) 500 MG tablet Take 500 mg by mouth daily.      Marland Kitchen warfarin (COUMADIN) 7.5 MG tablet use as directed       . [DISCONTINUED] loratadine (ALLERGY) 10 MG tablet Take 10 mg by mouth daily.      . benzonatate (TESSALON) 100 MG capsule Take 1 - 2 every 4-6 hours as needed for cough  30 capsule  4   . cefdinir (OMNICEF) 300 MG capsule Take 2 capsules (600 mg total) by mouth daily.  14 capsule  0  . dextromethorphan (DELSYM) 30 MG/5ML liquid Take 2-3 times daily as needed for cough  90 mL  0  . [DISCONTINUED] levothyroxine (SYNTHROID, LEVOTHROID) 100 MCG tablet Take 100 mcg by mouth daily.       No facility-administered encounter medications on file as of 08/06/2012.

## 2012-08-07 NOTE — Assessment & Plan Note (Signed)
Acute sinusitis flare and chronic cyclical cough syndrome Plan Hold loratidine for 7days then resume Take cefdinir 600mg  daily for 7days  Stay on neti pot twice daily Stay on flonase twice daily USe delsym 2-3 times daily for cough  Use benzonatate 1-2 every 4 hours as needed for cough Return as needed for Dr byrum

## 2012-08-28 ENCOUNTER — Other Ambulatory Visit: Payer: Self-pay | Admitting: Cardiovascular Disease

## 2012-08-28 ENCOUNTER — Ambulatory Visit (INDEPENDENT_AMBULATORY_CARE_PROVIDER_SITE_OTHER): Payer: Medicare Other | Admitting: Pharmacist Clinician (PhC)/ Clinical Pharmacy Specialist

## 2012-08-28 VITALS — BP 134/66 | HR 80

## 2012-08-28 DIAGNOSIS — D6859 Other primary thrombophilia: Secondary | ICD-10-CM

## 2012-08-28 DIAGNOSIS — I82409 Acute embolism and thrombosis of unspecified deep veins of unspecified lower extremity: Secondary | ICD-10-CM

## 2012-08-28 DIAGNOSIS — D6851 Activated protein C resistance: Secondary | ICD-10-CM

## 2012-08-28 DIAGNOSIS — Z7901 Long term (current) use of anticoagulants: Secondary | ICD-10-CM

## 2012-08-28 LAB — POCT INR: INR: 3.4

## 2012-08-28 NOTE — Telephone Encounter (Signed)
Rx was sent to pharmacy electronically. 

## 2012-09-11 ENCOUNTER — Ambulatory Visit (INDEPENDENT_AMBULATORY_CARE_PROVIDER_SITE_OTHER): Payer: Medicare Other | Admitting: Pharmacist Clinician (PhC)/ Clinical Pharmacy Specialist

## 2012-09-11 VITALS — BP 130/76 | HR 84

## 2012-09-11 DIAGNOSIS — Z7901 Long term (current) use of anticoagulants: Secondary | ICD-10-CM

## 2012-09-11 DIAGNOSIS — I82409 Acute embolism and thrombosis of unspecified deep veins of unspecified lower extremity: Secondary | ICD-10-CM

## 2012-09-11 DIAGNOSIS — D6851 Activated protein C resistance: Secondary | ICD-10-CM

## 2012-09-11 LAB — POCT INR: INR: 3

## 2012-09-11 MED ORDER — WARFARIN SODIUM 7.5 MG PO TABS: ORAL_TABLET | ORAL | Status: DC

## 2012-09-26 ENCOUNTER — Other Ambulatory Visit: Payer: Self-pay

## 2012-10-09 ENCOUNTER — Ambulatory Visit (INDEPENDENT_AMBULATORY_CARE_PROVIDER_SITE_OTHER): Payer: Medicare Other | Admitting: Pharmacist Clinician (PhC)/ Clinical Pharmacy Specialist

## 2012-10-09 VITALS — BP 130/68 | HR 76

## 2012-10-09 DIAGNOSIS — D6851 Activated protein C resistance: Secondary | ICD-10-CM

## 2012-10-09 DIAGNOSIS — D6859 Other primary thrombophilia: Secondary | ICD-10-CM

## 2012-10-09 DIAGNOSIS — I82409 Acute embolism and thrombosis of unspecified deep veins of unspecified lower extremity: Secondary | ICD-10-CM

## 2012-10-09 DIAGNOSIS — Z7901 Long term (current) use of anticoagulants: Secondary | ICD-10-CM

## 2012-10-09 LAB — POCT INR: INR: 3

## 2012-10-15 DIAGNOSIS — K635 Polyp of colon: Secondary | ICD-10-CM | POA: Insufficient documentation

## 2012-10-31 ENCOUNTER — Ambulatory Visit (INDEPENDENT_AMBULATORY_CARE_PROVIDER_SITE_OTHER): Payer: Medicare Other | Admitting: Critical Care Medicine

## 2012-10-31 ENCOUNTER — Encounter: Payer: Self-pay | Admitting: Critical Care Medicine

## 2012-10-31 VITALS — BP 144/82 | HR 87 | Temp 98.1°F | Ht 62.5 in | Wt 143.2 lb

## 2012-10-31 DIAGNOSIS — R05 Cough: Secondary | ICD-10-CM

## 2012-10-31 DIAGNOSIS — R059 Cough, unspecified: Secondary | ICD-10-CM

## 2012-10-31 MED ORDER — CEFDINIR 300 MG PO CAPS: 600.0000 mg | ORAL_CAPSULE | Freq: Every day | ORAL | Status: DC

## 2012-10-31 MED ORDER — BENZONATATE 100 MG PO CAPS: ORAL_CAPSULE | ORAL | Status: DC

## 2012-10-31 NOTE — Patient Instructions (Addendum)
Use tessalon perles 1-2 every 4 hours as needed for cough Cefdinir 600mg  daily for 7days Stay on neti pot and flonase Delsym over the counter for cough Return as needed

## 2012-10-31 NOTE — Progress Notes (Signed)
Subjective:    Patient ID: Robin Harrell, female    DOB: 1933/12/27, 79 y.o.   MRN: [id number]  HPI   79 yo female with Chronic rhinitis, chronic cough, and GERD, hiatal hernia. 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   ROV 11/27/08 -- follows up for her cough. Much improved after restarting NSW's and omeprazole. Has also been taking claritin + nasonex. Also stopped Symbicort, has not missed it.   ROV 06/01/09 -- Returns for f/u. Stopped Nasonex, still on loratadine. Still doing NSWs. Stopped omeprazole last month, hasn't really missed. Not on any BD's.   ROV 11/02/10 -- Pleasant 79 yo woman, hx GERD, chronic rhinitis, cough.  She returns today after she had return of her cough in May '12. She has noticed some choking  - both on her saliva and when eating/drinking.  She went off her omeprazole in May because she was doing well.  The cough started back so she restarted. Cough continued, seen by Dr Duanne Guess and started nasal steroid, changed omeprazole to Nexium. She also restarted NSW at that time in June. Cough productive of thick clear phlegm for the last 2 weeks.  >>REC: Nexium Twice daily  , cont allegra, flonase   11/10/2010 Acute OV  PT returns for persistent cough. Seen last week, tx w/ allegra and flonase along with increased PPI Twice daily  Dosing for possible reflux irritation.  Cough all during day and night. Feels something caught in throat at times. Cant get mucus up at times. Lots of throat clearing. Coughing up green mucus. Lots of nasal drainage. No sinus pain or pressure. Severe coughing fits.  Has hx of Hiatal hernia and GERD followed by Dr Ewing Schlein. ? Hx of intermittent Dysphagia.  Leaving for Vacation for 2 weeks to [facility] next week.  >>aggressive GERD /rhinitis prevention with cough suppressant, steroid burst and augmentin x 7d  11/16/2010 Follow up  Pt returns for follow up. Last ov with persistent cough. Tx for cyclical cough and bronchitis with aggressive GERD/AR  prevention with cough suppressants , prednisone taper and augmentin x 7 d.  She is feeling better but cough is not totally gone. Still has raspy voice and drainage in throat.   No fever or discolored mucus. She is leaving for 2 week vacation to [place] and cruise.   ROV 12/20/10 -- Hx chronic cough, GERD/hiatal hernia, rhinitis. Seen as above in Sept for flaring UA disease and cough. She was treated with pred taper and abx.  Doing fluticasone, allegra. Not using NSW every day. She is on Nexium. Recent EGD showed gastric polyps, no path results back yet. Her cough is about the same. She is getting choked when she eats and drinks.   ROV 06/21/11 -- Hx chronic cough, GERD/hiatal hernia, rhinitis. Hx of PE on chronic coumadin. She ran out of nexium bid ([employer]), insurance will no longer cover it. She was previously on omeprazole but it didn't work as well. She is not doing [place], she is on fluticasone and fexofenadine. She tells me that she was hospitalized in January '13 for RSV and viral PNA, ICU x 8 days, not ventilated. She is improved now after rehab stay. She has difficulty with intermittent episodes aspiration, has seen speech rx. She is having a lot of nasal gtt.   ROV 12/19/11 -- Severe COPD, LUL squamous cell CA s/p XRT. Last time we went back on omeprazole, restarted NSW, continued fluticasone. She changed fexofenadine to loratadine. She reports today that  she has been sneezing. She stopped the [place] 2 months ago. Breathing doing well. She feels that her cough is much better.   ROV 03/22/12 -- Severe COPD, LUL squamous cell CA s/p XRT. Chronic cough, components of GERD and PND.  2 weeks ago she started to have more nasal drainage. No other URI sx. She was exposed to an orchid 2 weeks ago. She is not currently on [place] or nasal steroid - we had weaned off  ROV 07/17/12 -- Severe COPD, LUL squamous cell CA s/p XRT. Chronic cough, components of GERD and PND. She has been using [place] but stopped for a week  when she went out of town. Ran out of fluticasone x 1 week also. Not sure that it has bothered her cough much. Remains on loratadine.     08/06/2012 Acute OV RB pt. Pt notes more cough and cotton in the throat.  Mucus is thick white to green.  No real fever. Still with pndrip.  Has to sleep in a recliner.  No real chest pain.  No real dyspnea.  10/31/2012 Chief Complaint  Patient presents with  . Acute Visit    RB pt.  Prod cough with thick, green mucus this morning, runny nose, and watery eyes.  Cough started over the weekend.  No increased SOB, wheezing, chest tightness/pain, or f/c/s.  Prod cough of thick mucus since weekend. No energy. Deep cough, raspy voice, pndrip, worse in AM if coughs Not that dyspneic.      Review of Systems  Constitutional:   No  weight loss, night sweats,  Fevers, chills, fatigue, lassitude. HEENT:   No headaches,  Difficulty swallowing,  Tooth/dental problems,  notesSore throat,                No sneezing, itching, ear ache, notes nasal congestion,notes post nasal drip,   CV:  No chest pain,  Orthopnea, PND, swelling in lower extremities, anasarca, dizziness, palpitations  GI  No heartburn, indigestion, abdominal pain, nausea, vomiting, diarrhea, change in bowel habits, loss of appetite  Resp: No shortness of breath with exertion or at rest.  No excess mucus, notes  productive cough,  Notes  non-productive cough,  No coughing up of blood.  Notes  change in color of mucus.  No wheezing.  No chest wall deformity  Skin: no rash or lesions.  GU: no dysuria, change in color of urine, no urgency or frequency.  No flank pain.  MS:  No joint pain or swelling.  No decreased range of motion.  No back pain.  Psych:  No change in mood or affect. No depression or anxiety.  No memory loss.     Objective:   Physical Exam  Filed Vitals:   10/31/12 1633  BP: 144/82  Pulse: 87  Temp: 98.1 F (36.7 C)  TempSrc: Oral  Height: 5' 2.5" (1.588 m)  Weight: 143 lb  3.2 oz (64.955 kg)  SpO2: 96%    Gen: Pleasant, well-nourished, in no distress,  normal affect  ENT: No lesions,  mouth clear,  oropharynx clear,purulent nares   Neck: No JVD, no TMG, no carotid bruits  Lungs: No use of accessory muscles, no dullness to percussion, clear without rales or rhonchi  Cardiovascular: RRR, heart sounds normal, no murmur or gallops, no peripheral edema  Abdomen: soft and NT, no HSM,  BS normal  Musculoskeletal: No deformities, no cyanosis or clubbing  Neuro: alert, non focal  Skin: Warm, no lesions or rashes  No results found.  Assessment & Plan:   COUGH, CHRONIC Chronic cough with associated mild acute sinusitis Plan Use tessalon perles 1-2 every 4 hours as needed for cough Cefdinir 600mg  daily for 7days Stay on neti pot and flonase Delsym over the counter for cough Return as needed     Updated Medication List Outpatient Encounter Prescriptions as of 10/31/2012  Medication Sig Dispense Refill  . atorvastatin (LIPITOR) 10 MG tablet Take 10 mg by mouth daily. Unsure of exact dose      . brimonidine (ALPHAGAN P) 0.1 % SOLN Place 1 drop into both eyes every 12 (twelve) hours.        . Cholecalciferol (VITAMIN D3) 2000 UNITS TABS Take 1 tablet by mouth daily.      Marland Kitchen estradiol (ESTRACE VAGINAL) 0.1 MG/GM vaginal cream Twice a week       . fluticasone (FLONASE) 50 MCG/ACT nasal spray Place 2 sprays into the nose 2 (two) times daily.  16 g  12  . latanoprost (XALATAN) 0.005 % ophthalmic solution 1 drop in each eye daily      . levothyroxine (SYNTHROID, LEVOTHROID) 25 MCG tablet Take 25 mcg by mouth daily before breakfast.      . loratadine (CLARITIN) 10 MG tablet Take 10 mg by mouth daily.      Marland Kitchen losartan (COZAAR) 100 MG tablet Take 100 mg by mouth daily.        . metoprolol tartrate (LOPRESSOR) 25 MG tablet TAKE ONE TABLET BY MOUTH ONE TIME DAILY  90 tablet  1  . multivitamin-iron-minerals-folic acid (CENTRUM) chewable tablet Chew 1  tablet by mouth daily.      . vitamin C (ASCORBIC ACID) 500 MG tablet Take 500 mg by mouth daily.      Marland Kitchen warfarin (COUMADIN) 7.5 MG tablet Take 1 tablet by mouth daily or as directed  90 tablet  1  . [DISCONTINUED] loratadine (ALLERGY) 10 MG tablet HOLD for 7days then resume      . benzonatate (TESSALON) 100 MG capsule Take 1 - 2 every 4-6 hours as needed for cough  30 capsule  4  . cefdinir (OMNICEF) 300 MG capsule Take 2 capsules (600 mg total) by mouth daily.  14 capsule  0  . dextromethorphan (DELSYM) 30 MG/5ML liquid Take 2-3 times daily as needed for cough  90 mL  0  . [DISCONTINUED] benzonatate (TESSALON) 100 MG capsule Take 1 - 2 every 4-6 hours as needed for cough  30 capsule  4  . [DISCONTINUED] cefdinir (OMNICEF) 300 MG capsule Take 2 capsules (600 mg total) by mouth daily.  14 capsule  0   No facility-administered encounter medications on file as of 10/31/2012.

## 2012-11-01 NOTE — Assessment & Plan Note (Signed)
Chronic cough with associated mild acute sinusitis Plan Use tessalon perles 1-2 every 4 hours as needed for cough Cefdinir 600mg  daily for 7days Stay on neti pot and flonase Delsym over the counter for cough Return as needed

## 2012-11-14 ENCOUNTER — Encounter: Payer: Self-pay | Admitting: *Deleted

## 2012-11-16 ENCOUNTER — Encounter: Payer: Self-pay | Admitting: Cardiovascular Disease

## 2012-11-19 ENCOUNTER — Ambulatory Visit (INDEPENDENT_AMBULATORY_CARE_PROVIDER_SITE_OTHER): Payer: Medicare Other | Admitting: Pharmacist Clinician (PhC)/ Clinical Pharmacy Specialist

## 2012-11-19 ENCOUNTER — Encounter: Payer: Self-pay | Admitting: Cardiovascular Disease

## 2012-11-19 ENCOUNTER — Ambulatory Visit (INDEPENDENT_AMBULATORY_CARE_PROVIDER_SITE_OTHER): Payer: Medicare Other | Admitting: Cardiovascular Disease

## 2012-11-19 VITALS — BP 110/70 | HR 92 | Ht 62.5 in | Wt 138.0 lb

## 2012-11-19 DIAGNOSIS — I10 Essential (primary) hypertension: Secondary | ICD-10-CM

## 2012-11-19 DIAGNOSIS — E782 Mixed hyperlipidemia: Secondary | ICD-10-CM | POA: Insufficient documentation

## 2012-11-19 DIAGNOSIS — D6851 Activated protein C resistance: Secondary | ICD-10-CM

## 2012-11-19 DIAGNOSIS — Z79899 Other long term (current) drug therapy: Secondary | ICD-10-CM

## 2012-11-19 DIAGNOSIS — D6859 Other primary thrombophilia: Secondary | ICD-10-CM

## 2012-11-19 DIAGNOSIS — Z7901 Long term (current) use of anticoagulants: Secondary | ICD-10-CM

## 2012-11-19 DIAGNOSIS — E785 Hyperlipidemia, unspecified: Secondary | ICD-10-CM | POA: Insufficient documentation

## 2012-11-19 DIAGNOSIS — I82409 Acute embolism and thrombosis of unspecified deep veins of unspecified lower extremity: Secondary | ICD-10-CM

## 2012-11-19 LAB — POCT INR: INR: 4

## 2012-11-19 NOTE — Assessment & Plan Note (Signed)
On statin therapy. We will recheck a lipid and liver profile 

## 2012-11-19 NOTE — Patient Instructions (Addendum)
Your physician wants you to follow-up in: 1 year with Dr Allyson Sabal. You will receive a reminder letter in the mail two months in advance. If you don't receive a letter, please call our office to schedule the follow-up appointment.  Have blood work done fasting

## 2012-11-19 NOTE — Assessment & Plan Note (Signed)
Well-controlled on current medications 

## 2012-11-19 NOTE — Progress Notes (Signed)
11/19/2012 [facility]   September 11, 1933  [id number]  Primary Physician Willow Ora, MD Primary Cardiologist: Runell Gess MD Roseanne Reno   HPI:  The patient is a 79 year old, thin-appearing, married Caucasian female, mother of 4, grandmother to 4 grandchildren who I last saw in the office on September 14, 2010. She has a history of treated hypertension, hyperlipidemia, and GERD. She does have a history of pulmonary embolism in the past with factor V Leiden deficiency on life-long Coumadin anticoagulation. She has had a normal 2D echo and Myoview back in 2009. She was recently admitted to [facility] for 2 weeks with acute respiratory insufficiency requiring intubation related to pneumonia and RSV. There was a thought that she may have had congestive heart failure as well, though a 2D echo was normal. She did have a Holter monitor that showed a large number of bigeminal PVCs which she is asymptomatic from. She currently denies chest pain or shortness of breath, though she is somewhat weak.    Current Outpatient Prescriptions  Medication Sig Dispense Refill  . atorvastatin (LIPITOR) 10 MG tablet Take 10 mg by mouth daily. Unsure of exact dose      . benzonatate (TESSALON) 100 MG capsule Take 1 - 2 every 4-6 hours as needed for cough  30 capsule  4  . brimonidine (ALPHAGAN P) 0.1 % SOLN Place 1 drop into both eyes every 12 (twelve) hours.        . Cholecalciferol (VITAMIN D3) 2000 UNITS TABS Take 1 tablet by mouth daily.      Marland Kitchen estradiol (ESTRACE VAGINAL) 0.1 MG/GM vaginal cream Twice a week       . fluticasone (FLONASE) 50 MCG/ACT nasal spray Place 2 sprays into the nose 2 (two) times daily.  16 g  12  . latanoprost (XALATAN) 0.005 % ophthalmic solution 1 drop in each eye daily      . levothyroxine (SYNTHROID, LEVOTHROID) 25 MCG tablet Take 25 mcg by mouth daily before breakfast.      . loratadine (CLARITIN) 10 MG tablet Take 10 mg by mouth daily.      Marland Kitchen losartan  (COZAAR) 100 MG tablet Take 100 mg by mouth daily.        . metoprolol tartrate (LOPRESSOR) 25 MG tablet TAKE ONE TABLET BY MOUTH ONE TIME DAILY  90 tablet  1  . multivitamin-iron-minerals-folic acid (CENTRUM) chewable tablet Chew 1 tablet by mouth daily.      . vitamin C (ASCORBIC ACID) 500 MG tablet Take 500 mg by mouth daily.      Marland Kitchen warfarin (COUMADIN) 7.5 MG tablet Take 1 tablet by mouth daily or as directed  90 tablet  1   No current facility-administered medications for this visit.    Allergies  Allergen Reactions  . Codeine   . Erythromycin     History   Social History  . Marital Status: Married    Spouse Name: N/A    Number of Children: 4  . Years of Education: 13.5   Occupational History  . Diplomatic Services operational officer - retired    Social History Main Topics  . Smoking status: Former Smoker -- 1 years    Types: Cigarettes    Quit date: 02/21/1958  . Smokeless tobacco: Never Used  . Alcohol Use: No  . Drug Use: No  . Sexual Activity: Not on file   Other Topics Concern  . Not on file   Social History Narrative  . No narrative on file  Review of Systems: General: negative for chills, fever, night sweats or weight changes.  Cardiovascular: negative for chest pain, dyspnea on exertion, edema, orthopnea, palpitations, paroxysmal nocturnal dyspnea or shortness of breath Dermatological: negative for rash Respiratory: negative for cough or wheezing Urologic: negative for hematuria Abdominal: negative for nausea, vomiting, diarrhea, bright red blood per rectum, melena, or hematemesis Neurologic: negative for visual changes, syncope, or dizziness All other systems reviewed and are otherwise negative except as noted above.    Blood pressure 110/70, pulse 92, height 5' 2.5" (1.588 m), weight 138 lb (62.596 kg).  General appearance: alert and no distress Neck: no adenopathy, no carotid bruit, no JVD, supple, symmetrical, trachea midline and thyroid not enlarged, symmetric, no  tenderness/mass/nodules Lungs: clear to auscultation bilaterally Heart: regular rate and rhythm, S1, S2 normal, no murmur, click, rub or gallop Extremities: extremities normal, atraumatic, no cyanosis or edema  EKG normal sinus rhythm at 92 with bigeminal PVCs  ASSESSMENT AND PLAN:   HYPERTENSION Well-controlled on current medications  Factor 5 Leiden mutation, heterozygous On chronic Coumadin anticoagulation followed here in our clinic. She does have a history of a remote pulmonary embolus.  Hyperlipidemia On statin therapy. We will recheck a lipid and liver profile.      Runell Gess MD FACP,FACC,FAHA, [facility] 11/19/2012 10:38 AM

## 2012-11-19 NOTE — Assessment & Plan Note (Signed)
On chronic Coumadin anticoagulation followed here in our clinic. She does have a history of a remote pulmonary embolus.

## 2012-11-20 ENCOUNTER — Ambulatory Visit: Payer: Medicare Other | Admitting: Pharmacist Clinician (PhC)/ Clinical Pharmacy Specialist

## 2012-11-20 LAB — HEPATIC FUNCTION PANEL
ALT: 16 U/L (ref 0–35)
AST: 19 U/L (ref 0–37)
Alkaline Phosphatase: 64 U/L (ref 39–117)
Bilirubin, Direct: 0.2 mg/dL (ref 0.0–0.3)
Indirect Bilirubin: 0.7 mg/dL (ref 0.0–0.9)
Total Bilirubin: 0.9 mg/dL (ref 0.3–1.2)

## 2012-11-20 LAB — LIPID PANEL
Cholesterol: 170 mg/dL (ref 0–200)
Total CHOL/HDL Ratio: 2.3 Ratio

## 2012-11-28 NOTE — Progress Notes (Signed)
Quick Note:  Released into my chart ______ 

## 2012-12-04 ENCOUNTER — Ambulatory Visit (INDEPENDENT_AMBULATORY_CARE_PROVIDER_SITE_OTHER): Payer: Medicare Other | Admitting: Pharmacist Clinician (PhC)/ Clinical Pharmacy Specialist

## 2012-12-04 VITALS — BP 140/68 | HR 64

## 2012-12-04 DIAGNOSIS — D6851 Activated protein C resistance: Secondary | ICD-10-CM

## 2012-12-04 DIAGNOSIS — D6859 Other primary thrombophilia: Secondary | ICD-10-CM

## 2012-12-04 DIAGNOSIS — Z7901 Long term (current) use of anticoagulants: Secondary | ICD-10-CM

## 2012-12-04 DIAGNOSIS — I82409 Acute embolism and thrombosis of unspecified deep veins of unspecified lower extremity: Secondary | ICD-10-CM

## 2012-12-04 LAB — POCT INR: INR: 4.8

## 2012-12-14 ENCOUNTER — Ambulatory Visit (INDEPENDENT_AMBULATORY_CARE_PROVIDER_SITE_OTHER): Payer: Medicare Other | Admitting: Pharmacist Clinician (PhC)/ Clinical Pharmacy Specialist

## 2012-12-14 VITALS — BP 134/60 | HR 60

## 2012-12-14 DIAGNOSIS — Z7901 Long term (current) use of anticoagulants: Secondary | ICD-10-CM

## 2012-12-14 DIAGNOSIS — I82409 Acute embolism and thrombosis of unspecified deep veins of unspecified lower extremity: Secondary | ICD-10-CM

## 2012-12-14 DIAGNOSIS — D6851 Activated protein C resistance: Secondary | ICD-10-CM

## 2012-12-14 DIAGNOSIS — D6859 Other primary thrombophilia: Secondary | ICD-10-CM

## 2012-12-27 ENCOUNTER — Other Ambulatory Visit: Payer: Self-pay

## 2013-01-01 ENCOUNTER — Ambulatory Visit (INDEPENDENT_AMBULATORY_CARE_PROVIDER_SITE_OTHER): Payer: Medicare Other | Admitting: Pharmacist Clinician (PhC)/ Clinical Pharmacy Specialist

## 2013-01-01 VITALS — BP 120/60 | HR 72

## 2013-01-01 DIAGNOSIS — D6851 Activated protein C resistance: Secondary | ICD-10-CM

## 2013-01-01 DIAGNOSIS — I82409 Acute embolism and thrombosis of unspecified deep veins of unspecified lower extremity: Secondary | ICD-10-CM

## 2013-01-01 DIAGNOSIS — Z7901 Long term (current) use of anticoagulants: Secondary | ICD-10-CM

## 2013-01-01 DIAGNOSIS — D6859 Other primary thrombophilia: Secondary | ICD-10-CM

## 2013-01-29 ENCOUNTER — Ambulatory Visit (INDEPENDENT_AMBULATORY_CARE_PROVIDER_SITE_OTHER): Payer: Medicare Other | Admitting: Pharmacist Clinician (PhC)/ Clinical Pharmacy Specialist

## 2013-01-29 VITALS — BP 120/66 | HR 64

## 2013-01-29 DIAGNOSIS — Z7901 Long term (current) use of anticoagulants: Secondary | ICD-10-CM

## 2013-01-29 DIAGNOSIS — D6851 Activated protein C resistance: Secondary | ICD-10-CM

## 2013-01-29 DIAGNOSIS — D6859 Other primary thrombophilia: Secondary | ICD-10-CM

## 2013-01-29 DIAGNOSIS — I82409 Acute embolism and thrombosis of unspecified deep veins of unspecified lower extremity: Secondary | ICD-10-CM

## 2013-01-29 LAB — POCT INR: INR: 2.1

## 2013-02-01 DIAGNOSIS — R600 Localized edema: Secondary | ICD-10-CM | POA: Insufficient documentation

## 2013-02-27 ENCOUNTER — Other Ambulatory Visit: Payer: Self-pay | Admitting: Cardiovascular Disease

## 2013-02-27 ENCOUNTER — Ambulatory Visit (INDEPENDENT_AMBULATORY_CARE_PROVIDER_SITE_OTHER): Payer: Medicare Other | Admitting: Pharmacist Clinician (PhC)/ Clinical Pharmacy Specialist

## 2013-02-27 VITALS — BP 134/70 | HR 76

## 2013-02-27 DIAGNOSIS — Z5181 Encounter for therapeutic drug level monitoring: Secondary | ICD-10-CM

## 2013-02-27 DIAGNOSIS — I82409 Acute embolism and thrombosis of unspecified deep veins of unspecified lower extremity: Secondary | ICD-10-CM

## 2013-02-27 DIAGNOSIS — D6851 Activated protein C resistance: Secondary | ICD-10-CM

## 2013-02-27 DIAGNOSIS — D6859 Other primary thrombophilia: Secondary | ICD-10-CM

## 2013-02-27 DIAGNOSIS — Z7901 Long term (current) use of anticoagulants: Secondary | ICD-10-CM

## 2013-02-27 LAB — POCT INR: INR: 1.9

## 2013-02-27 NOTE — Telephone Encounter (Signed)
Rx was sent to pharmacy electronically. 

## 2013-03-14 ENCOUNTER — Encounter: Payer: Self-pay | Admitting: Emergency Medicine

## 2013-03-14 ENCOUNTER — Ambulatory Visit (INDEPENDENT_AMBULATORY_CARE_PROVIDER_SITE_OTHER): Payer: Medicare Other | Admitting: Emergency Medicine

## 2013-03-14 VITALS — BP 124/78 | HR 76 | Ht 62.5 in | Wt 145.2 lb

## 2013-03-14 DIAGNOSIS — R0982 Postnasal drip: Secondary | ICD-10-CM

## 2013-03-14 DIAGNOSIS — R05 Cough: Secondary | ICD-10-CM

## 2013-03-14 DIAGNOSIS — R059 Cough, unspecified: Secondary | ICD-10-CM

## 2013-03-14 MED ORDER — CETIRIZINE HCL 10 MG PO TABS: 10.0000 mg | ORAL_TABLET | Freq: Every day | ORAL | Status: DC

## 2013-03-14 NOTE — Assessment & Plan Note (Addendum)
-   stop loratadine - start zyrtec - continue fluticasone nasal spray - continue NSW - add chlorpheniramine prn  - swallow precautions - rov 6 weeks

## 2013-03-14 NOTE — Assessment & Plan Note (Signed)
-   consider add back PPI if the rhinitis regimen is not helpful

## 2013-03-14 NOTE — Progress Notes (Signed)
Subjective:    Patient ID: Robin Harrell, female    DOB: Dec 31, 1933, 80 y.o.   MRN: [id number]  HPI  80 yo female with Chronic rhinitis, chronic cough, and GERD, hiatal hernia. 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   ROV 11/27/08 -- follows up for her cough. Much improved after restarting NSW's and omeprazole. Has also been taking claritin + nasonex. Also stopped Symbicort, has not missed it.   ROV 06/01/09 -- Returns for f/u. Stopped Nasonex, still on loratadine. Still doing NSWs. Stopped omeprazole last month, hasn't really missed. Not on any BD's.   ROV 11/02/10 -- Pleasant 80 yo woman, hx GERD, chronic rhinitis, cough.  She returns today after she had return of her cough in May '12. She has noticed some choking  - both on her saliva and when eating/drinking.  She went off her omeprazole in May because she was doing well.  The cough started back so she restarted. Cough continued, seen by Dr Ernie Hew and started nasal steroid, changed omeprazole to Nexium. She also restarted NSW at that time in June. Cough productive of thick clear phlegm for the last 2 weeks.  >>REC: Nexium Twice daily  , cont allegra, flonase   11/10/2010 Acute OV  PT returns for persistent cough. Seen last week, tx w/ allegra and flonase along with increased PPI Twice daily  Dosing for possible reflux irritation.  Cough all during day and night. Feels something caught in throat at times. Cant get mucus up at times. Lots of throat clearing. Coughing up green mucus. Lots of nasal drainage. No sinus pain or pressure. Severe coughing fits.  Has hx of Hiatal hernia and GERD followed by Dr Watt Climes. ? Hx of intermittent Dysphagia.  Leaving for Vacation for 2 weeks to [facility] next week.  >>aggressive GERD /rhinitis prevention with cough suppressant, steroid burst and augmentin x 7d  11/16/2010 Follow up  Pt returns for follow up. Last ov with persistent cough. Tx for cyclical cough and bronchitis with aggressive GERD/AR  prevention with cough suppressants , prednisone taper and augmentin x 7 d.  She is feeling better but cough is not totally gone. Still has raspy voice and drainage in throat.   No fever or discolored mucus. She is leaving for 2 week vacation to [place] and cruise.   ROV 12/20/10 -- Hx chronic cough, GERD/hiatal hernia, rhinitis. Seen as above in Sept for flaring UA disease and cough. She was treated with pred taper and abx.  Doing fluticasone, allegra. Not using NSW every day. She is on Nexium. Recent EGD showed gastric polyps, no path results back yet. Her cough is about the same. She is getting choked when she eats and drinks.   ROV 06/21/11 -- Hx chronic cough, GERD/hiatal hernia, rhinitis. Hx of PE on chronic coumadin. She ran out of nexium bid ([employer]), insurance will no longer cover it. She was previously on omeprazole but it didn't work as well. She is not doing [place], she is on fluticasone and fexofenadine. She tells me that she was hospitalized in January '13 for RSV and viral PNA, ICU x 8 days, not ventilated. She is improved now after rehab stay. She has difficulty with intermittent episodes aspiration, has seen speech rx. She is having a lot of nasal gtt.   ROV 12/19/11 -- Severe COPD, LUL squamous cell CA s/p XRT. Last time we went back on omeprazole, restarted NSW, continued fluticasone. She changed fexofenadine to loratadine. She reports today that she  has been sneezing. She stopped the [place] 2 months ago. Breathing doing well. She feels that her cough is much better.   ROV 03/22/12 -- Severe COPD, LUL squamous cell CA s/p XRT. Chronic cough, components of GERD and PND.  2 weeks ago she started to have more nasal drainage. No other URI sx. She was exposed to an orchid 2 weeks ago. She is not currently on [place] or nasal steroid - we had weaned off  ROV 07/17/12 -- Severe COPD, LUL squamous cell CA s/p XRT. Chronic cough, components of GERD and PND. She has been using [place] but stopped for a week  when she went out of town. Ran out of fluticasone x 1 week also. Not sure that it has bothered her cough much. Remains on loratadine.     08/06/2012 Acute OV RB pt. Pt notes more cough and cotton in the throat.  Mucus is thick white to green.  No real fever. Still with pndrip.  Has to sleep in a recliner.  No real chest pain.  No real dyspnea.  10/31/2012 Chief Complaint  Patient presents with  . Acute Visit    RB pt.  Prod cough with thick, green mucus this morning, runny nose, and watery eyes.  Cough started over the weekend.  No increased SOB, wheezing, chest tightness/pain, or f/c/s.  Prod cough of thick mucus since weekend. No energy. Deep cough, raspy voice, pndrip, worse in AM if coughs Not that dyspneic.     ROV 03/14/13 -- Severe COPD, LUL squamous cell CA s/p XRT. Chronic cough, components of GERD and PND. She is on [place] and fluticasone nasal spray. Episodic. She has some choking, ? Aspiration sx. She has significant PND, some blood mixed in. No dyspnea   Review of Systems As per HPI     Objective:   Physical Exam Filed Vitals:   03/14/13 1059  BP: 124/78  Pulse: 76  Height: 5' 2.5" (1.588 m)  Weight: 145 lb 3.2 oz (65.862 kg)  SpO2: 98%    Gen: Pleasant, well-nourished, in no distress,  normal affect  ENT: No lesions,  mouth clear,  oropharynx clear,purulent nares   Neck: No JVD, no TMG, no carotid bruits  Lungs: No use of accessory muscles, no dullness to percussion, clear without rales or rhonchi  Cardiovascular: RRR, heart sounds normal, no murmur or gallops, no peripheral edema  Abdomen: soft and NT, no HSM,  BS normal  Musculoskeletal: No deformities, no cyanosis or clubbing  Neuro: alert, non focal  Skin: Warm, no lesions or rashes  No results found.        Assessment & Plan:   POSTNASAL DRIP - stop loratadine - start zyrtec - continue fluticasone nasal spray - continue NSW - add chlorpheniramine prn  - swallow precautions - rov 6  weeks  COUGH, CHRONIC - consider add back PPI if the rhinitis regimen is not helpful    Updated Medication List Outpatient Encounter Prescriptions as of 03/14/2013  Medication Sig  . atorvastatin (LIPITOR) 10 MG tablet Take 10 mg by mouth daily. Unsure of exact dose  . brimonidine (ALPHAGAN P) 0.1 % SOLN Place 1 drop into both eyes every 12 (twelve) hours.    . Cholecalciferol (VITAMIN D3) 2000 UNITS TABS Take 1 tablet by mouth daily.  . fluticasone (FLONASE) 50 MCG/ACT nasal spray Place 2 sprays into the nose 2 (two) times daily.  Marland Kitchen latanoprost (XALATAN) 0.005 % ophthalmic solution 1 drop in each eye daily  . levothyroxine (SYNTHROID,  LEVOTHROID) 25 MCG tablet Take 25 mcg by mouth daily before breakfast.  . loratadine (CLARITIN) 10 MG tablet Take 10 mg by mouth daily.  Marland Kitchen losartan (COZAAR) 100 MG tablet Take 100 mg by mouth daily.    . metoprolol tartrate (LOPRESSOR) 25 MG tablet TAKE 1 TABLET BY MOUTH ONE TIME DAILY  . multivitamin-iron-minerals-folic acid (CENTRUM) chewable tablet Chew 1 tablet by mouth daily.  . vitamin C (ASCORBIC ACID) 500 MG tablet Take 500 mg by mouth daily.  Marland Kitchen warfarin (COUMADIN) 7.5 MG tablet Take 1 tablet by mouth daily or as directed  . [DISCONTINUED] estradiol (ESTRACE VAGINAL) 0.1 MG/GM vaginal cream Twice a week   . benzonatate (TESSALON) 100 MG capsule Take 1 - 2 every 4-6 hours as needed for cough  . cetirizine (ZYRTEC) 10 MG tablet Take 1 tablet (10 mg total) by mouth daily.  . traZODone (DESYREL) 50 MG tablet 1 tablet daily.

## 2013-03-14 NOTE — Patient Instructions (Signed)
Stop lotaradine (Claritin) Start Zyrtec 10mg  daily Continue your nasal washes, fluticasone nasal spray Start OTC decongestants that contain chlorpheniramine to use as needed as directed Practice swallowing precautions - tuck your chin with swallowing, take small bites and chew well, drink a small amount of water after each bite.  Follow with Dr Lamonte Sakai in 6 weeks or sooner if you have any problems

## 2013-03-26 ENCOUNTER — Ambulatory Visit (INDEPENDENT_AMBULATORY_CARE_PROVIDER_SITE_OTHER): Payer: Medicare Other | Admitting: Pharmacist Clinician (PhC)/ Clinical Pharmacy Specialist

## 2013-03-26 VITALS — BP 148/80 | HR 84

## 2013-03-26 DIAGNOSIS — D6851 Activated protein C resistance: Secondary | ICD-10-CM

## 2013-03-26 DIAGNOSIS — Z7901 Long term (current) use of anticoagulants: Secondary | ICD-10-CM

## 2013-03-26 DIAGNOSIS — I82409 Acute embolism and thrombosis of unspecified deep veins of unspecified lower extremity: Secondary | ICD-10-CM

## 2013-03-26 DIAGNOSIS — D6859 Other primary thrombophilia: Secondary | ICD-10-CM

## 2013-03-26 LAB — POCT INR: INR: 2.1

## 2013-03-28 ENCOUNTER — Other Ambulatory Visit: Payer: Self-pay | Admitting: Emergency Medicine

## 2013-04-11 ENCOUNTER — Other Ambulatory Visit: Payer: Self-pay | Admitting: Pharmacist Clinician (PhC)/ Clinical Pharmacy Specialist

## 2013-04-23 ENCOUNTER — Other Ambulatory Visit: Payer: Self-pay | Admitting: Family Medicine

## 2013-04-23 DIAGNOSIS — M810 Age-related osteoporosis without current pathological fracture: Secondary | ICD-10-CM

## 2013-04-23 DIAGNOSIS — Z1231 Encounter for screening mammogram for malignant neoplasm of breast: Secondary | ICD-10-CM

## 2013-04-24 ENCOUNTER — Ambulatory Visit: Payer: Medicare Other | Admitting: Pharmacist Clinician (PhC)/ Clinical Pharmacy Specialist

## 2013-04-24 ENCOUNTER — Ambulatory Visit (INDEPENDENT_AMBULATORY_CARE_PROVIDER_SITE_OTHER): Payer: Medicare Other | Admitting: Pharmacist Clinician (PhC)/ Clinical Pharmacy Specialist

## 2013-04-24 VITALS — BP 132/60 | HR 56

## 2013-04-24 DIAGNOSIS — Z7901 Long term (current) use of anticoagulants: Secondary | ICD-10-CM

## 2013-04-24 DIAGNOSIS — D6851 Activated protein C resistance: Secondary | ICD-10-CM

## 2013-04-24 DIAGNOSIS — D6859 Other primary thrombophilia: Secondary | ICD-10-CM

## 2013-04-24 DIAGNOSIS — I82409 Acute embolism and thrombosis of unspecified deep veins of unspecified lower extremity: Secondary | ICD-10-CM

## 2013-04-24 LAB — POCT INR: INR: 3.9

## 2013-04-30 ENCOUNTER — Ambulatory Visit (INDEPENDENT_AMBULATORY_CARE_PROVIDER_SITE_OTHER): Payer: Medicare Other | Admitting: Emergency Medicine

## 2013-04-30 ENCOUNTER — Encounter: Payer: Self-pay | Admitting: Emergency Medicine

## 2013-04-30 VITALS — BP 140/82 | HR 101 | Ht 61.0 in | Wt 143.0 lb

## 2013-04-30 DIAGNOSIS — J310 Chronic rhinitis: Secondary | ICD-10-CM

## 2013-04-30 NOTE — Patient Instructions (Signed)
Please continue your zyrtec, fluticasone nasal spray, nasal washes. Decrease the nasal washes to once a day until your coumadin level is rechecked and is back in the desired range.  You may take your hydrocodone for your headache.  If your nasal drainage and bleeding continues then we will refer you to see Dr Janace Hoard with ENT.  Follow with Dr Lamonte Sakai in 3 months or sooner if you have any problems.

## 2013-04-30 NOTE — Assessment & Plan Note (Signed)
Chronic nasal gtt associated with HA and epistaxis. Her INR is elevated and is being adjusted.   Please continue your zyrtec, fluticasone nasal spray, nasal washes. Decrease the nasal washes to once a day until your coumadin level is rechecked and is back in the desired range.  You may take your hydrocodone for your headache.  If your nasal drainage and bleeding continues then we will refer you to see Dr Janace Hoard with ENT.  Follow with Dr Lamonte Sakai in 3 months or sooner if you have any problems

## 2013-04-30 NOTE — Progress Notes (Signed)
Subjective:    Patient ID: Robin Harrell, female    DOB: Dec 31, 1933, 80 y.o.   MRN: [id number]  HPI  80 yo female with Chronic rhinitis, chronic cough, and GERD, hiatal hernia. 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   ROV 11/27/08 -- follows up for her cough. Much improved after restarting NSW's and omeprazole. Has also been taking claritin + nasonex. Also stopped Symbicort, has not missed it.   ROV 06/01/09 -- Returns for f/u. Stopped Nasonex, still on loratadine. Still doing NSWs. Stopped omeprazole last month, hasn't really missed. Not on any BD's.   ROV 11/02/10 -- Pleasant 80 yo woman, hx GERD, chronic rhinitis, cough.  She returns today after she had return of her cough in May '12. She has noticed some choking  - both on her saliva and when eating/drinking.  She went off her omeprazole in May because she was doing well.  The cough started back so she restarted. Cough continued, seen by Dr Ernie Hew and started nasal steroid, changed omeprazole to Nexium. She also restarted NSW at that time in June. Cough productive of thick clear phlegm for the last 2 weeks.  >>REC: Nexium Twice daily  , cont allegra, flonase   11/10/2010 Acute OV  PT returns for persistent cough. Seen last week, tx w/ allegra and flonase along with increased PPI Twice daily  Dosing for possible reflux irritation.  Cough all during day and night. Feels something caught in throat at times. Cant get mucus up at times. Lots of throat clearing. Coughing up green mucus. Lots of nasal drainage. No sinus pain or pressure. Severe coughing fits.  Has hx of Hiatal hernia and GERD followed by Dr Watt Climes. ? Hx of intermittent Dysphagia.  Leaving for Vacation for 2 weeks to [facility] next week.  >>aggressive GERD /rhinitis prevention with cough suppressant, steroid burst and augmentin x 7d  11/16/2010 Follow up  Pt returns for follow up. Last ov with persistent cough. Tx for cyclical cough and bronchitis with aggressive GERD/AR  prevention with cough suppressants , prednisone taper and augmentin x 7 d.  She is feeling better but cough is not totally gone. Still has raspy voice and drainage in throat.   No fever or discolored mucus. She is leaving for 2 week vacation to [place] and cruise.   ROV 12/20/10 -- Hx chronic cough, GERD/hiatal hernia, rhinitis. Seen as above in Sept for flaring UA disease and cough. She was treated with pred taper and abx.  Doing fluticasone, allegra. Not using NSW every day. She is on Nexium. Recent EGD showed gastric polyps, no path results back yet. Her cough is about the same. She is getting choked when she eats and drinks.   ROV 06/21/11 -- Hx chronic cough, GERD/hiatal hernia, rhinitis. Hx of PE on chronic coumadin. She ran out of nexium bid ([employer]), insurance will no longer cover it. She was previously on omeprazole but it didn't work as well. She is not doing [place], she is on fluticasone and fexofenadine. She tells me that she was hospitalized in January '13 for RSV and viral PNA, ICU x 8 days, not ventilated. She is improved now after rehab stay. She has difficulty with intermittent episodes aspiration, has seen speech rx. She is having a lot of nasal gtt.   ROV 12/19/11 -- Severe COPD, LUL squamous cell CA s/p XRT. Last time we went back on omeprazole, restarted NSW, continued fluticasone. She changed fexofenadine to loratadine. She reports today that she  has been sneezing. She stopped the [place] 2 months ago. Breathing doing well. She feels that her cough is much better.   ROV 03/22/12 -- Severe COPD, LUL squamous cell CA s/p XRT. Chronic cough, components of GERD and PND.  2 weeks ago she started to have more nasal drainage. No other URI sx. She was exposed to an orchid 2 weeks ago. She is not currently on [place] or nasal steroid - we had weaned off  ROV 07/17/12 -- Severe COPD, LUL squamous cell CA s/p XRT. Chronic cough, components of GERD and PND. She has been using [place] but stopped for a week  when she went out of town. Ran out of fluticasone x 1 week also. Not sure that it has bothered her cough much. Remains on loratadine.     08/06/2012 Acute OV RB pt. Pt notes more cough and cotton in the throat.  Mucus is thick white to green.  No real fever. Still with pndrip.  Has to sleep in a recliner.  No real chest pain.  No real dyspnea.  10/31/2012 Chief Complaint  Patient presents with  . Acute Visit    RB pt.  Prod cough with thick, green mucus this morning, runny nose, and watery eyes.  Cough started over the weekend.  No increased SOB, wheezing, chest tightness/pain, or f/c/s.  Prod cough of thick mucus since weekend. No energy. Deep cough, raspy voice, pndrip, worse in AM if coughs Not that dyspneic.     ROV 03/14/13 -- Severe COPD, LUL squamous cell CA s/p XRT. Chronic cough, components of GERD and PND. She is on [place] and fluticasone nasal spray. Episodic. She has some choking, ? Aspiration sx. She has significant PND, some blood mixed in. No dyspnea  ROV 04/30/13 -- Severe COPD, LUL squamous cell CA s/p XRT. Chronic cough, components of GERD and PND. She has been having trouble with DDD and back pain. She has had a HA starting today. It started at therapy today. She has bloody nasal drainage. White nasal drainage with blood, no purulence. She is on zyrtec, fluticasone, nasal washes bid. Her coumadin is [facility] by [facility] > her INR has been high on her last check.    Review of Systems As per HPI     Objective:   Physical Exam Filed Vitals:   04/30/13 1344  BP: 140/82  Pulse: 101  Height: 5\' 1"  (1.549 m)  Weight: 143 lb (64.864 kg)  SpO2: 97%    Gen: Pleasant, well-nourished, in no distress,  normal affect  ENT: No lesions,  mouth clear,  oropharynx clear, dried blood in the R nare  Neck: No JVD,   Lungs: No use of accessory muscles, clear without rales or rhonchi  Cardiovascular: RRR, heart sounds normal, no murmur or gallops, no peripheral edema  Musculoskeletal:  No deformities, no cyanosis or clubbing  Neuro: alert, non focal  Skin: Warm, no lesions or rashes       Assessment & Plan:   CHRONIC RHINITIS Chronic nasal gtt associated with HA and epistaxis. Her INR is elevated and is being adjusted.   Please continue your zyrtec, fluticasone nasal spray, nasal washes. Decrease the nasal washes to once a day until your coumadin level is rechecked and is back in the desired range.  You may take your hydrocodone for your headache.  If your nasal drainage and bleeding continues then we will refer you to see Dr Janace Hoard with ENT.  Follow with Dr Lamonte Sakai in 3 months or sooner  if you have any problems    Updated Medication List Outpatient Encounter Prescriptions as of 04/30/2013  Medication Sig  . atorvastatin (LIPITOR) 10 MG tablet Take 10 mg by mouth daily. Unsure of exact dose  . benzonatate (TESSALON) 100 MG capsule Take 1 - 2 every 4-6 hours as needed for cough  . brimonidine (ALPHAGAN P) 0.1 % SOLN Place 1 drop into both eyes every 12 (twelve) hours.    . cetirizine (ZYRTEC) 10 MG tablet Take 1 tablet (10 mg total) by mouth daily.  . Cholecalciferol (VITAMIN D3) 2000 UNITS TABS Take 1 tablet by mouth daily.  . fluticasone (FLONASE) 50 MCG/ACT nasal spray INSTILL 2 SPRAYS IN EACH NOSTRIL TWICE DAILY.  Marland Kitchen HYDROcodone-acetaminophen (NORCO/VICODIN) 5-325 MG per tablet Take 1 tablet by mouth 2 (two) times daily.  Marland Kitchen latanoprost (XALATAN) 0.005 % ophthalmic solution 1 drop in each eye daily  . levothyroxine (SYNTHROID, LEVOTHROID) 25 MCG tablet Take 25 mcg by mouth daily before breakfast.  . losartan (COZAAR) 100 MG tablet Take 100 mg by mouth daily.    . metoprolol tartrate (LOPRESSOR) 25 MG tablet TAKE 1 TABLET BY MOUTH ONE TIME DAILY  . multivitamin-iron-minerals-folic acid (CENTRUM) chewable tablet Chew 1 tablet by mouth daily.  . traZODone (DESYREL) 50 MG tablet 1 tablet daily.  . vitamin C (ASCORBIC ACID) 500 MG tablet Take 500 mg by mouth daily.  Marland Kitchen  warfarin (COUMADIN) 7.5 MG tablet TAKE 1 TABLET BY MOUTH DAILY OR AS DIRECTED.  Marland Kitchen loratadine (CLARITIN) 10 MG tablet Take 10 mg by mouth daily.

## 2013-05-07 ENCOUNTER — Ambulatory Visit
Admission: RE | Admit: 2013-05-07 | Discharge: 2013-05-07 | Disposition: A | Discharge status: Home / Self Care | Payer: Medicare Other | Source: Ambulatory Visit | Attending: Family Medicine | Admitting: Family Medicine

## 2013-05-07 DIAGNOSIS — M810 Age-related osteoporosis without current pathological fracture: Secondary | ICD-10-CM

## 2013-05-15 ENCOUNTER — Ambulatory Visit (INDEPENDENT_AMBULATORY_CARE_PROVIDER_SITE_OTHER): Payer: Medicare Other | Admitting: Pharmacist Clinician (PhC)/ Clinical Pharmacy Specialist

## 2013-05-15 DIAGNOSIS — Z7901 Long term (current) use of anticoagulants: Secondary | ICD-10-CM

## 2013-05-15 DIAGNOSIS — D6851 Activated protein C resistance: Secondary | ICD-10-CM

## 2013-05-15 DIAGNOSIS — D6859 Other primary thrombophilia: Secondary | ICD-10-CM

## 2013-05-15 DIAGNOSIS — I82409 Acute embolism and thrombosis of unspecified deep veins of unspecified lower extremity: Secondary | ICD-10-CM

## 2013-05-15 LAB — POCT INR: INR: 2.1

## 2013-05-21 ENCOUNTER — Ambulatory Visit (INDEPENDENT_AMBULATORY_CARE_PROVIDER_SITE_OTHER): Payer: Medicare Other | Admitting: Emergency Medicine

## 2013-05-21 ENCOUNTER — Encounter: Payer: Self-pay | Admitting: Emergency Medicine

## 2013-05-21 VITALS — BP 128/86 | HR 93 | Ht 63.0 in | Wt 143.0 lb

## 2013-05-21 DIAGNOSIS — R059 Cough, unspecified: Secondary | ICD-10-CM

## 2013-05-21 DIAGNOSIS — K219 Gastro-esophageal reflux disease without esophagitis: Secondary | ICD-10-CM

## 2013-05-21 DIAGNOSIS — J309 Allergic rhinitis, unspecified: Secondary | ICD-10-CM

## 2013-05-21 DIAGNOSIS — R05 Cough: Secondary | ICD-10-CM

## 2013-05-21 MED ORDER — ESOMEPRAZOLE MAGNESIUM 40 MG PO PACK: 40.0000 mg | PACK | Freq: Every day | ORAL | Status: DC

## 2013-05-21 NOTE — Progress Notes (Signed)
Subjective:    Patient ID: Robin Harrell, female    DOB: [birth year], 80 y.o.   MRN: [id number]  HPI  80 yo female with COPD, Chronic rhinitis, chronic cough, and GERD, hiatal hernia. 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     ROV 03/14/13 -- Severe COPD, LUL squamous cell CA s/p XRT. Chronic cough, components of GERD and PND. She is on [place] and fluticasone nasal spray. Episodic. She has some choking, ? Aspiration sx. She has significant PND, some blood mixed in. No dyspnea  ROV 04/30/13 -- Severe COPD, LUL squamous cell CA s/p XRT. Chronic cough, components of GERD and PND. She has been having trouble with DDD and back pain. She has had a HA starting today. It started at therapy today. She has bloody nasal drainage. White nasal drainage with blood, no purulence. She is on zyrtec, fluticasone, nasal washes bid. Her coumadin is managed by [facility] > her INR has been high on her last check.   ROV 05/21/13 -- Severe COPD, LUL squamous cell CA s/p XRT. Chronic cough, components of GERD and PND. She returns with continued nasal congestion, cough, some blood from her nose. She is coughing less but clearly hasn't stopped. She coughs sometimes with swallowing. Not on GERD therapy right now.     Review of Systems As per HPI     Objective:   Physical Exam Filed Vitals:   05/21/13 1155  BP: 128/86  Pulse: 93  Height: 5\' 3"  (1.6 m)  Weight: 143 lb (64.864 kg)  SpO2: 99%    Gen: Pleasant, well-nourished, in no distress,  normal affect  ENT: No lesions,  mouth clear,  oropharynx clear,   Neck: No JVD,   Lungs: No use of accessory muscles, clear without rales or rhonchi  Cardiovascular: RRR, heart sounds normal, no murmur or gallops, no peripheral edema  Musculoskeletal: No deformities, no cyanosis or clubbing  Neuro: alert, non focal  Skin: Warm, no lesions or rashes       Assessment & Plan:   GERD Has been a large part of her cough in the past and currently untreated -  start nexium empirically  ALLERGIC RHINITIS - continue zyrtec, fluticasone, nasal saline washes  COUGH, CHRONIC - swallowing eval - refer to ENT, she knows Dr Janace Hoard    Updated Medication List Outpatient Encounter Prescriptions as of 05/21/2013  Medication Sig  . atorvastatin (LIPITOR) 10 MG tablet Take 10 mg by mouth daily. Unsure of exact dose  . brimonidine (ALPHAGAN P) 0.1 % SOLN Place 1 drop into both eyes every 12 (twelve) hours.    . cetirizine (ZYRTEC) 10 MG tablet Take 1 tablet (10 mg total) by mouth daily.  . Cholecalciferol (VITAMIN D3) 2000 UNITS TABS Take 1 tablet by mouth daily.  . fluticasone (FLONASE) 50 MCG/ACT nasal spray INSTILL 2 SPRAYS IN EACH NOSTRILqd  . HYDROcodone-acetaminophen (NORCO/VICODIN) 5-325 MG per tablet Take 1 tablet by mouth 2 (two) times daily.  Marland Kitchen latanoprost (XALATAN) 0.005 % ophthalmic solution 1 drop in each eye daily  . losartan (COZAAR) 100 MG tablet Take 100 mg by mouth daily.    . metoprolol tartrate (LOPRESSOR) 25 MG tablet TAKE 1 TABLET BY MOUTH ONE TIME DAILY  . multivitamin-iron-minerals-folic acid (CENTRUM) chewable tablet Chew 1 tablet by mouth daily.  . traZODone (DESYREL) 50 MG tablet 1 tablet daily.  Marland Kitchen warfarin (COUMADIN) 7.5 MG tablet TAKE 1 TABLET BY MOUTH DAILY OR AS DIRECTED.  . [DISCONTINUED] fluticasone (FLONASE)  50 MCG/ACT nasal spray INSTILL 2 SPRAYS IN EACH NOSTRIL TWICE DAILY.  . benzonatate (TESSALON) 100 MG capsule Take 1 - 2 every 4-6 hours as needed for cough  . esomeprazole (NEXIUM) 40 MG packet Take 40 mg by mouth daily before breakfast.  . loratadine (CLARITIN) 10 MG tablet Take 10 mg by mouth daily.

## 2013-05-21 NOTE — Patient Instructions (Addendum)
Please start nexium 40mg  twice a day for one week and then change to once a day until your next visit  We will perform a swallowing evaluation with speech therapy We will refer you to see Dr Janace Hoard in ENT Continue your zyrtec, nasal washes and fluticasone nasal spray Follow with Dr Lamonte Sakai in 2 months or sooner if you have any problems.

## 2013-05-21 NOTE — Assessment & Plan Note (Signed)
-   continue zyrtec, fluticasone, nasal saline washes

## 2013-05-21 NOTE — Assessment & Plan Note (Signed)
Has been a large part of her cough in the past and currently untreated - start nexium empirically

## 2013-05-21 NOTE — Assessment & Plan Note (Signed)
-   swallowing eval - refer to ENT, she knows Dr Janace Hoard

## 2013-05-22 ENCOUNTER — Other Ambulatory Visit (HOSPITAL_COMMUNITY): Payer: Self-pay | Admitting: Emergency Medicine

## 2013-05-22 DIAGNOSIS — R1310 Dysphagia, unspecified: Secondary | ICD-10-CM

## 2013-06-04 ENCOUNTER — Ambulatory Visit (HOSPITAL_COMMUNITY)
Admission: RE | Admit: 2013-06-04 | Discharge: 2013-06-04 | Disposition: A | Discharge status: Home / Self Care | Payer: Medicare Other | Source: Ambulatory Visit | Attending: Emergency Medicine | Admitting: Emergency Medicine

## 2013-06-04 DIAGNOSIS — R1310 Dysphagia, unspecified: Secondary | ICD-10-CM

## 2013-06-04 DIAGNOSIS — R059 Cough, unspecified: Secondary | ICD-10-CM | POA: Insufficient documentation

## 2013-06-04 DIAGNOSIS — R05 Cough: Secondary | ICD-10-CM

## 2013-06-04 NOTE — Procedures (Signed)
Objective Swallowing Evaluation:    Patient Details  Name: Robin Harrell MRN: [id number] Date of Birth: [birth year]  Today's Date: 06/04/2013 Time: 1315-1340 SLP Time Calculation (min): 25 min  Past Medical History:  Past Medical History  Diagnosis Date  . Allergic rhinitis   . DVT (deep venous thrombosis)   . HTN (hypertension)   . PND (post-nasal drip)   . GERD (gastroesophageal reflux disease)   . Cough   . Difficulty in swallowing     w/ occasional aspiration  . Pulmonary embolism   . Factor V Leiden deficiency     lifelong coumadin  . Dyslipidemia   . History of nuclear stress test 12/28/2007    lexiscan; low risk    Past Surgical History:  Past Surgical History  Procedure Laterality Date  . Cholecystectomy  1973  . History of sleep study  12/27/2007    AHI during total sleep 0.9/hr and REM 1.5/hr; RDI during total sleep6.0/hr and REM 6.1/hr  . Bunionectomy    . Hammer toe surgery    . Replacement total knee  01/11/2002    right  . Transthoracic echocardiogram  12/28/2007    borderline conc LVH with normal systolic function; MV mod thickened with mild MVP & mild MR   HPI:  80 yo referred by Dr Lamonte Sakai for MBS due to concerns pt may be aspirating.  PMH + for severe COPD, chronic rhinitis that is exacerbated at this time, GERD and HH, chronic cough- h/o PE on coumadin.  Pt reports being very ill in 2013 while in [place] from RSV and pna - requiring 8 day ICU and 20 day rehab stay.   Pt medication list includes Zyrtec, Nexium, Claritin, Tessalon prn, Hydrocodone-acetaminophen, lipitor, alphagan, cozaar, lopressor, desyrel, coumadin.  Pt reports issue with choking on food and even saliva.  Denies weight loss, pnas nor ever requriing heimlich manuever.      Assessment / Plan / Recommendation Clinical Impression  Dysphagia Diagnosis: Within Functional Limits (pt with known "mildly decreased esophageal persistalsis" "negative for reflux or stricture" per esophagram  01/2011)   Clinical impression:   Functional oropharyngeal swallow ability without aspiration or frank laryngeal penetration of any consistency tested.  Oropharyngeal swallow with adequate strength and timing without stasis.  Please note, pt cleared her throat and occasionally coughed throughout entire procedure.  At one point, pt reported sensation of pharyneal stasis - when she appeared with mild amount delayed clearance distally in esophagus - suspect referrant sensation.    Recommend pt continue reg/thin diet with general precautions.  Of note, pt reports she has recently started Nexium but denies improvement with coughing/choking on intake and saliva.    SLP questions if pt's symptoms could be "extra esophageal" in nature.  Skilled intervention included educating pt to precautions using teach back and live video for reinforement.     Treatment Recommendation  No treatment recommended at this time    Diet Recommendation Regular;Thin liquid   Liquid Administration via: Cup;Straw Medication Administration: Whole meds with liquid (consider chewable or with pudding if problematic) Supervision: Patient able to self feed Compensations: Slow rate;Small sips/bites Postural Changes and/or Swallow Maneuvers: Seated upright 90 degrees;Upright 30-60 min after meal (start meal with liquids due to pt xerostomia)    Other  Recommendations Oral Care Recommendations: Oral care BID             SLP Swallow Goals     General Date of Onset: 06/04/13 HPI: 80 yo referred by Dr Lamonte Sakai for MBS  due to concerns pt may be aspirating.  PMH + for severe COPD, chronic rhinitis that is exacerbated at this time, GERD and HH, chronic cough- h/o PE on coumadin.  Pt reports being very ill in 2013 while in [place] from RSV and pna - requiring 8 day ICU and 20 day rehab stay.   Pt medication list includes Zyrtec, Nexium, Claritin, Tessalon prn, Hydrocodone-acetaminophen, lipitor, alphagan, cozaar, lopressor, desyrel,  coumadin.  Pt reports issue with choking on food and even saliva.  Denies weight loss, pnas nor ever requriing heimlich manuever.  Reason for Referral: Objectively evaluate swallowing function Previous Swallow Assessment: esophagram 2012 mildly decreased esophageal persistalsis, negative for reflux or stricture, MBS 12/2010:  suspect primary esophageal dysphagia - no aspiration or penetration - oropharyngeal swallow was timely and strong - intermittent appearance of prominent cricopharyngeus when drinking thin liquids, suspect pill dysphagia due to esophageal dysphagia, RSI score was 35/45 indicating high likelihood of LPR  Respiratory Status: Room air History of Recent Intubation: No Behavior/Cognition: Alert;Cooperative;Pleasant mood Oral Cavity - Dentition: Adequate natural dentition Oral Motor / Sensory Function: Within functional limits (pt with essential tremor-pt reports tremor to be chronic for years) Self-Feeding Abilities: Able to feed self Patient Positioning: Upright in chair Baseline Vocal Quality: Clear Volitional Cough: Strong Volitional Swallow: Able to elicit Anatomy: Within functional limits Pharyngeal Secretions: Not observed secondary MBS    Reason for Referral Objectively evaluate swallowing function   Oral Phase Oral Preparation/Oral Phase Oral Phase: WFL   Pharyngeal Phase Pharyngeal Phase Pharyngeal Phase: Within functional limits  Cervical Esophageal Phase    GO    Cervical Esophageal Phase Cervical Esophageal Phase: WFL (appearance of widened esophagus below UES, distal esophagus appeared with mildly slow clearance and minimal retrograde propulsion without pt awareness, barium tablet taken with thin readily transversed through pharynx and esophagus into stomach)    Functional Assessment Tool Used: mbs Functional Limitations: Swallowing Swallow Current Status (F3545): At least 1 percent but less than 20 percent impaired, limited or restricted Swallow Goal  Status [phone]): At least 1 percent but less than 20 percent impaired, limited or restricted Swallow Discharge Status [phone]): At least 1 percent but less than 20 percent impaired, limited or restricted    Luanna Salk, [facility] SLP [phone]

## 2013-06-12 ENCOUNTER — Ambulatory Visit (INDEPENDENT_AMBULATORY_CARE_PROVIDER_SITE_OTHER): Payer: Medicare Other | Admitting: Pharmacist Clinician (PhC)/ Clinical Pharmacy Specialist

## 2013-06-12 DIAGNOSIS — D6851 Activated protein C resistance: Secondary | ICD-10-CM

## 2013-06-12 DIAGNOSIS — I82409 Acute embolism and thrombosis of unspecified deep veins of unspecified lower extremity: Secondary | ICD-10-CM

## 2013-06-12 DIAGNOSIS — D6859 Other primary thrombophilia: Secondary | ICD-10-CM

## 2013-06-12 DIAGNOSIS — Z7901 Long term (current) use of anticoagulants: Secondary | ICD-10-CM

## 2013-06-12 LAB — POCT INR: INR: 2.8

## 2013-06-25 ENCOUNTER — Ambulatory Visit: Payer: Medicare Other

## 2013-06-26 ENCOUNTER — Ambulatory Visit
Admission: RE | Admit: 2013-06-26 | Discharge: 2013-06-26 | Disposition: A | Discharge status: Home / Self Care | Payer: Medicare Other | Source: Ambulatory Visit | Attending: Family Medicine | Admitting: Family Medicine

## 2013-06-26 DIAGNOSIS — Z1231 Encounter for screening mammogram for malignant neoplasm of breast: Secondary | ICD-10-CM

## 2013-07-09 ENCOUNTER — Ambulatory Visit (INDEPENDENT_AMBULATORY_CARE_PROVIDER_SITE_OTHER): Payer: Medicare Other | Admitting: Pharmacist Clinician (PhC)/ Clinical Pharmacy Specialist

## 2013-07-09 DIAGNOSIS — Z7901 Long term (current) use of anticoagulants: Secondary | ICD-10-CM

## 2013-07-09 DIAGNOSIS — D6851 Activated protein C resistance: Secondary | ICD-10-CM

## 2013-07-09 DIAGNOSIS — D6859 Other primary thrombophilia: Secondary | ICD-10-CM

## 2013-07-09 DIAGNOSIS — I82409 Acute embolism and thrombosis of unspecified deep veins of unspecified lower extremity: Secondary | ICD-10-CM

## 2013-07-09 LAB — POCT INR: INR: 2.4

## 2013-07-16 ENCOUNTER — Encounter: Payer: Self-pay | Admitting: Emergency Medicine

## 2013-07-16 ENCOUNTER — Ambulatory Visit (INDEPENDENT_AMBULATORY_CARE_PROVIDER_SITE_OTHER): Payer: Medicare Other | Admitting: Emergency Medicine

## 2013-07-16 VITALS — BP 120/72 | HR 84 | Ht 63.0 in | Wt 137.6 lb

## 2013-07-16 DIAGNOSIS — K219 Gastro-esophageal reflux disease without esophagitis: Secondary | ICD-10-CM

## 2013-07-16 DIAGNOSIS — J309 Allergic rhinitis, unspecified: Secondary | ICD-10-CM

## 2013-07-16 NOTE — Assessment & Plan Note (Signed)
-   she is understandably concerned about being on PPI with osteoporosis. Will order a pH probe to decide whether she needs to be treated.

## 2013-07-16 NOTE — Patient Instructions (Signed)
We will check to see how costly the pH probe testing is. If possible we will perform this test We will refer you to see Dr Annamaria Boots for an allergy assessment Please continue your fluticasone nasal spray (we will refill), zyrtec and nasal saline washes.  Do not restart nexium for now.  Follow with Dr Lamonte Sakai in 2 months or sooner if you have any problems.

## 2013-07-16 NOTE — Assessment & Plan Note (Signed)
We will refer you to see Dr Annamaria Boots for allergy testing  - restart nasal steroid, continue zyrtec.

## 2013-07-16 NOTE — Progress Notes (Signed)
Subjective:    Patient ID: Robin Harrell, female    DOB: 06-16-1933, 80 y.o.   MRN: [id number]  HPI  80 yo female with COPD, Chronic rhinitis, chronic cough, and GERD, hiatal hernia. 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     ROV 03/14/13 -- Severe COPD, LUL squamous cell CA s/p XRT. Chronic cough, components of GERD and PND. She is on [place] and fluticasone nasal spray. Episodic. She has some choking, ? Aspiration sx. She has significant PND, some blood mixed in. No dyspnea  ROV 04/30/13 -- Severe COPD, LUL squamous cell CA s/p XRT. Chronic cough, components of GERD and PND. She has been having trouble with DDD and back pain. She has had a HA starting today. It started at therapy today. She has bloody nasal drainage. White nasal drainage with blood, no purulence. She is on zyrtec, fluticasone, nasal washes bid. Her coumadin is managed by [facility] > her INR has been high on her last check.   ROV 05/21/13 -- Severe COPD, LUL squamous cell CA s/p XRT. Chronic cough, components of GERD and PND. She returns with continued nasal congestion, cough, some blood from her nose. She is coughing less but clearly hasn't stopped. She coughs sometimes with swallowing. Not on GERD therapy right now.    ROV 07/16/13 -- Severe COPD, LUL squamous cell CA s/p XRT. Chronic cough, components of GERD and PND.  She is coughing . Stopped Nexium about 3 weeks ago because she doidn't fel any better. Barium swallow 06/04/13 without overt aspiration, did have freq throat clearing and cough - they queried extra esophageal cause. Dr Janace Hoard wanted her to do pH probe, hasn't been done yet.  She is on zyrtec, ran out of flonase spray last week. She is doing the [place] daily.    Review of Systems As per HPI     Objective:   Physical Exam Filed Vitals:   07/16/13 1347  BP: 120/72  Pulse: 84  Height: 5\' 3"  (1.6 m)  Weight: 137 lb 9.6 oz (62.415 kg)  SpO2: 97%    Gen: Pleasant, well-nourished, in no distress,  normal  affect  ENT: No lesions,  mouth clear,  oropharynx clear,   Neck: No JVD,   Lungs: No use of accessory muscles, clear without rales or rhonchi  Cardiovascular: RRR, heart sounds normal, no murmur or gallops, no peripheral edema  Musculoskeletal: No deformities, no cyanosis or clubbing  Neuro: alert, non focal  Skin: Warm, no lesions or rashes       Assessment & Plan:   GERD - she is understandably concerned about being on PPI with osteoporosis. Will order a pH probe to decide whether she needs to be treated.   ALLERGIC RHINITIS We will refer you to see Dr Annamaria Boots for allergy testing  - restart nasal steroid, continue zyrtec.     Updated Medication List Outpatient Encounter Prescriptions as of 07/16/2013  Medication Sig  . alendronate (FOSAMAX) 70 MG tablet Take 1 tablet by mouth every 7 (seven) days.  Marland Kitchen atorvastatin (LIPITOR) 10 MG tablet Take 10 mg by mouth daily. Unsure of exact dose  . brimonidine (ALPHAGAN P) 0.1 % SOLN Place 1 drop into both eyes every 12 (twelve) hours.    . cetirizine (ZYRTEC) 10 MG tablet Take 1 tablet (10 mg total) by mouth daily.  . Cholecalciferol (VITAMIN D3) 2000 UNITS TABS Take 1 tablet by mouth daily.  Marland Kitchen HYDROcodone-acetaminophen (NORCO/VICODIN) 5-325 MG per tablet Take 1 tablet  by mouth 2 (two) times daily.  Marland Kitchen latanoprost (XALATAN) 0.005 % ophthalmic solution 1 drop in each eye daily  . losartan (COZAAR) 100 MG tablet Take 100 mg by mouth daily.    . metoprolol tartrate (LOPRESSOR) 25 MG tablet TAKE 1 TABLET BY MOUTH ONE TIME DAILY  . multivitamin-iron-minerals-folic acid (CENTRUM) chewable tablet Chew 1 tablet by mouth daily.  . traZODone (DESYREL) 50 MG tablet 1 tablet daily.  Marland Kitchen warfarin (COUMADIN) 7.5 MG tablet TAKE 1 TABLET BY MOUTH DAILY OR AS DIRECTED.  . fluticasone (FLONASE) 50 MCG/ACT nasal spray INSTILL 2 SPRAYS IN [facility] NOSTRILqd

## 2013-07-31 ENCOUNTER — Telehealth: Payer: Self-pay | Admitting: Emergency Medicine

## 2013-07-31 MED ORDER — MOMETASONE FUROATE 50 MCG/ACT NA SUSP: 2.0000 | Freq: Every day | NASAL | Status: DC

## 2013-07-31 NOTE — Telephone Encounter (Signed)
Pt given sample of nasonex and would like to continue using this instead of flonase.

## 2013-07-31 NOTE — Telephone Encounter (Signed)
LMTCB Pt's med list states she's on flonase and she's requesting nasonex Waiting on return to clarify medication being requested

## 2013-08-01 ENCOUNTER — Telehealth: Payer: Self-pay | Admitting: *Deleted

## 2013-08-01 NOTE — Telephone Encounter (Signed)
PA for nasonex 45mcg PA # [phone] ID # [id number]  I called spoke with Raquel Sarna a PA rep for [employer]. I was advised this does not require a PA. Nothing showing on their end to do a PA Ref # for call [id number] Called [facility] and made them aware of the above. Nothing further needed

## 2013-08-16 ENCOUNTER — Encounter: Payer: Self-pay | Admitting: Internal Medicine

## 2013-08-16 ENCOUNTER — Ambulatory Visit (INDEPENDENT_AMBULATORY_CARE_PROVIDER_SITE_OTHER): Payer: Medicare Other | Admitting: Internal Medicine

## 2013-08-16 ENCOUNTER — Other Ambulatory Visit: Payer: Medicare Other

## 2013-08-16 VITALS — BP 118/62 | HR 85 | Ht 62.0 in | Wt 139.2 lb

## 2013-08-16 DIAGNOSIS — R053 Chronic cough: Secondary | ICD-10-CM

## 2013-08-16 DIAGNOSIS — J302 Other seasonal allergic rhinitis: Secondary | ICD-10-CM

## 2013-08-16 DIAGNOSIS — J309 Allergic rhinitis, unspecified: Secondary | ICD-10-CM

## 2013-08-16 DIAGNOSIS — J3089 Other allergic rhinitis: Secondary | ICD-10-CM

## 2013-08-16 DIAGNOSIS — J310 Chronic rhinitis: Secondary | ICD-10-CM

## 2013-08-16 DIAGNOSIS — R05 Cough: Secondary | ICD-10-CM

## 2013-08-16 DIAGNOSIS — K219 Gastro-esophageal reflux disease without esophagitis: Secondary | ICD-10-CM

## 2013-08-16 DIAGNOSIS — R059 Cough, unspecified: Secondary | ICD-10-CM

## 2013-08-16 NOTE — Assessment & Plan Note (Signed)
She pretty clearly describes LPR/ choking with swallowing Plan-consider swallowing evaluation if not already done

## 2013-08-16 NOTE — Progress Notes (Signed)
08/16/13- 57 yoF former smoker COMPLAINS OF:  Referred for allergy evaluation by Dr Rosalio Macadamia reports having cough, chest and sinus congestion. Medical hx of DVT, chronic cough, chronic rhinitis, GERD,  HBP, glaucoma, coumadin She complains of coughing and choking for the past 6 months. No particular pattern. Chewing gum and Delsym cough syrup help. Eating chocolate, spicy foods and food going down wrong way make it worse. She describes nasal congestion, post nasal drip and cough for many years. A friend suggested it might be allergy. She has never been tested. She sneezes after supper. Much nasal congestion and nose blowing. Mucus sometimes discolored but not bloody. No headaches. Nasonex helps but Flonase did not. Pneumonia 2 years ago for RSV -hospital. Denies skin rash or specific food intolerance  Prior to Admission medications   Medication Sig Start Date End Date Taking? Authorizing Provider  alendronate (FOSAMAX) 70 MG tablet Take 1 tablet by mouth every 7 (seven) days. 05/21/13  Yes Historical Provider, MD  atorvastatin (LIPITOR) 10 MG tablet Take 10 mg by mouth daily. Unsure of exact dose   Yes Historical Provider, MD  brimonidine (ALPHAGAN P) 0.1 % SOLN Place 1 drop into both eyes every 12 (twelve) hours.     Yes Historical Provider, MD  cetirizine (ZYRTEC) 10 MG tablet Take 1 tablet (10 mg total) by mouth daily. 03/14/13  Yes Collene Gobble, MD  Cholecalciferol (VITAMIN D3) 2000 UNITS TABS Take 1 tablet by mouth daily.   Yes Historical Provider, MD  latanoprost (XALATAN) 0.005 % ophthalmic solution 1 drop in each eye daily 10/08/10  Yes Historical Provider, MD  losartan (COZAAR) 100 MG tablet Take 100 mg by mouth daily.     Yes Historical Provider, MD  metoprolol tartrate (LOPRESSOR) 25 MG tablet TAKE 1 TABLET BY MOUTH ONE TIME DAILY 02/27/13  Yes Lorretta Harp, MD  mometasone (NASONEX) 50 MCG/ACT nasal spray Place 2 sprays into the nose daily. 07/31/13  Yes Collene Gobble, MD   multivitamin-iron-minerals-folic acid (CENTRUM) chewable tablet Chew 1 tablet by mouth daily.   Yes Historical Provider, MD  traZODone (DESYREL) 50 MG tablet 1 tablet daily. 02/27/13  Yes Historical Provider, MD  warfarin (COUMADIN) 7.5 MG tablet TAKE 1 TABLET BY MOUTH DAILY OR AS DIRECTED. 04/11/13  Yes [name], RPH-CPP  HYDROcodone-acetaminophen (NORCO/VICODIN) 5-325 MG per tablet Take 1 tablet by mouth 2 (two) times daily. 03/23/13   Historical Provider, MD   Past Medical History  Diagnosis Date  . Allergic rhinitis   . DVT (deep venous thrombosis)   . HTN (hypertension)   . PND (post-nasal drip)   . GERD (gastroesophageal reflux disease)   . Cough   . Difficulty in swallowing     w/ occasional aspiration  . Pulmonary embolism   . Factor V Leiden deficiency     lifelong coumadin  . Dyslipidemia   . History of nuclear stress test 12/28/2007    lexiscan; low risk    Past Surgical History  Procedure Laterality Date  . Cholecystectomy  1973  . History of sleep study  12/27/2007    AHI during total sleep 0.9/hr and REM 1.5/hr; RDI during total sleep6.0/hr and REM 6.1/hr  . Bunionectomy    . Hammer toe surgery    . Replacement total knee  01/11/2002    right  . Transthoracic echocardiogram  12/28/2007    borderline conc LVH with normal systolic function; MV mod thickened with mild MVP & mild MR   Family History  Problem Relation  Age of Onset  . Allergic rhinitis Sister   . Coronary artery disease Paternal Grandfather     MI  . Coronary artery disease Maternal Grandfather     MI  . Throat cancer Paternal Grandfather    History   Social History  . Marital Status: Married    Spouse Name: N/A    Number of Children: 4  . Years of Education: 13.5   Occupational History  . Network engineer - retired    Social History Main Topics  . Smoking status: Former Smoker -- 1 years    Types: Cigarettes    Quit date: 02/21/1958  . Smokeless tobacco: Never Used  . Alcohol Use: No  .  Drug Use: No  . Sexual Activity: Not on file   Other Topics Concern  . Not on file   Social History Narrative  . No narrative on file   ROS-see HPI Constitutional:   No-   weight loss, night sweats, fevers, chills, fatigue, lassitude. HEENT:   No-  headaches, +difficulty swallowing, tooth/dental problems, sore throat,       +sneezing, itching, ear ache, nasal congestion, post nasal drip,  CV:  No-   chest pain, orthopnea, PND, +swelling in lower extremities, anasarca,                                  dizziness, palpitations Resp: +shortness of breath with exertion or at rest.              +productive cough,  No non-productive cough,  No- coughing up of blood.              +change in color of mucus.  No- wheezing.   Skin: No-   rash or lesions. GI:  No-   heartburn, indigestion, abdominal pain, nausea, vomiting, diarrhea,                 change in bowel habits, loss of appetite GU: No-   dysuria, change in color of urine, no urgency or frequency.  No- flank pain. MS:  + joint pain or swelling.  No- decreased range of motion.  No- back pain. Neuro-     nothing unusual Psych:  No- change in mood or affect. No depression or anxiety.  No memory loss.  OBJ- Physical Exam General- Alert, Oriented, Affect-appropriate, Distress- none acute Skin- rash-none, lesions- none, excoriation- none Lymphadenopathy- none Head- atraumatic            Eyes- Gross vision intact, PERRLA, conjunctivae and secretions clear            Ears- Hearing, canals-normal            Nose- +rhinitis R>L, no-Septal dev, mucus+bridging, polyps, erosion, perforation             Throat- Mallampati II-III , mucosa clear , drainage- none, tonsils- atrophic Neck- flexible , trachea midline, no stridor , thyroid nl, carotid no bruit Chest - symmetrical excursion , unlabored           Heart/CV- RRR , no murmur , no gallop  , no rub, nl s1 s2                           - JVD- none , edema- none, stasis changes- none, varices-  none           Lung- clear to P&A, wheeze- none,  cough+ light with deep inspiration , dullness-none,                      rub- none           Chest wall-  Abd- tender-no, distended-no, bowel sounds-present, HSM- no Br/ Gen/ Rectal- Not done, not indicated Extrem- cyanosis- none, clubbing, none, atrophy- none, strength- nl, +brace R ankle Neuro- grossly intact to observation

## 2013-08-16 NOTE — Assessment & Plan Note (Signed)
History suggests potential component of postnasal drip/allergy and reflux/LPR contributing to cough Plan-food allergy profile, watch response to Dymista, emphasized reflux precautions

## 2013-08-16 NOTE — Assessment & Plan Note (Signed)
There may be allergic and nonallergic components Plan-allergy profile, sample Dymista

## 2013-08-16 NOTE — Patient Instructions (Signed)
Sample Dymista nasal spray   1-2 puffs each nostril once daily at bedtime       Try this instead of nasonex, flonase or nasacort. When you use up the Dymista sample, go back to one of these others.   Order- lab- Allergy profile, Food IgE profile       Dx Chronic rhinitis, chronic cough

## 2013-08-19 ENCOUNTER — Ambulatory Visit (INDEPENDENT_AMBULATORY_CARE_PROVIDER_SITE_OTHER): Payer: Medicare Other | Admitting: Pharmacist Clinician (PhC)/ Clinical Pharmacy Specialist

## 2013-08-19 DIAGNOSIS — I82409 Acute embolism and thrombosis of unspecified deep veins of unspecified lower extremity: Secondary | ICD-10-CM

## 2013-08-19 DIAGNOSIS — Z7901 Long term (current) use of anticoagulants: Secondary | ICD-10-CM

## 2013-08-19 DIAGNOSIS — D6851 Activated protein C resistance: Secondary | ICD-10-CM

## 2013-08-19 DIAGNOSIS — D6859 Other primary thrombophilia: Secondary | ICD-10-CM

## 2013-08-19 LAB — ALLERGEN FOOD PROFILE SPECIFIC IGE
Apple: <0.1 kU/L
Chicken IgE: <0.1 kU/L
Corn: <0.1 kU/L
IGE (IMMUNOGLOBULIN E), SERUM: 4 [IU]/mL (ref 0.0–180.0)
Milk IgE: <0.1 kU/L
Orange: <0.1 kU/L
Peanut IgE: <0.1 kU/L
Soybean IgE: <0.1 kU/L
Tomato IgE: <0.1 kU/L
Tuna IgE: <0.1 kU/L

## 2013-08-19 LAB — ALLERGY FULL PROFILE
Allergen, D pternoyssinus,d7: <0.1 kU/L
Alternaria Alternata: <0.1 kU/L
Aspergillus fumigatus, m3: <0.1 kU/L
Bermuda Grass: <0.1 kU/L
Candida Albicans: <0.1 kU/L
Cat Dander: <0.1 kU/L
D. farinae: <0.1 kU/L
Dog Dander: <0.1 kU/L
Elm IgE: <0.1 kU/L
G005 Rye, Perennial: <0.1 kU/L
G009 Red Top: <0.1 kU/L
Goldenrod: <0.1 kU/L
Helminthosporium halodes: <0.1 kU/L
House Dust Hollister: <0.1 kU/L
Lamb's Quarters: <0.1 kU/L
Oak: <0.1 kU/L
Plantain: <0.1 kU/L
Sycamore Tree: <0.1 kU/L

## 2013-08-19 LAB — POCT INR: INR: 2.3

## 2013-09-18 ENCOUNTER — Ambulatory Visit: Payer: Medicare Other | Admitting: Emergency Medicine

## 2013-10-01 ENCOUNTER — Ambulatory Visit: Payer: Medicare Other | Admitting: Pharmacist Clinician (PhC)/ Clinical Pharmacy Specialist

## 2013-10-02 ENCOUNTER — Ambulatory Visit: Payer: Medicare Other | Admitting: Pharmacist Clinician (PhC)/ Clinical Pharmacy Specialist

## 2013-10-03 ENCOUNTER — Encounter: Payer: Self-pay | Admitting: Internal Medicine

## 2013-10-03 ENCOUNTER — Ambulatory Visit: Payer: Medicare Other | Admitting: Internal Medicine

## 2013-10-03 MED ORDER — AZELASTINE-FLUTICASONE 137-50 MCG/ACT NA SUSP: NASAL | Status: DC

## 2013-10-03 NOTE — Progress Notes (Signed)
08/16/13- 62 yoF former smoker COMPLAINS OF:  Referred for allergy evaluation by Dr Rosalio Macadamia reports having cough, chest and sinus congestion. Medical hx of DVT, chronic cough, chronic rhinitis, GERD,  HBP, glaucoma, coumadin She complains of coughing and choking for the past 6 months. No particular pattern. Chewing gum and Delsym cough syrup help. Eating chocolate, spicy foods and food going down wrong way make it worse. She describes nasal congestion, post nasal drip and cough for many years. A friend suggested it might be allergy. She has never been tested. She sneezes after supper. Much nasal congestion and nose blowing. Mucus sometimes discolored but not bloody. No headaches. Nasonex helps but Flonase did not. Pneumonia 2 years ago for RSV -hospital. Denies skin rash or specific food intolerance  8//13/15- 79 yoF former smoker COMPLAINS OF:  Referred for allergy evaluation by Dr Rosalio Macadamia reports having cough, chest and sinus congestion, hx of DVT, chronic cough, chronic rhinitis, GERD,  HBP, glaucoma, coumadin FOLLOWS FOR: used Dymista sample up and has had no troubles with allergies since.     Marland Kitchen ROS-see HPI Constitutional:   No-   weight loss, night sweats, fevers, chills, fatigue, lassitude. HEENT:   No-  headaches, +difficulty swallowing, tooth/dental problems, sore throat,       +sneezing, itching, ear ache, nasal congestion, post nasal drip,  CV:  No-   chest pain, orthopnea, PND, +swelling in lower extremities, anasarca,                                  dizziness, palpitations Resp: +shortness of breath with exertion or at rest.              +productive cough,  No non-productive cough,  No- coughing up of blood.              +change in color of mucus.  No- wheezing.   Skin: No-   rash or lesions. GI:  No-   heartburn, indigestion, abdominal pain, nausea, vomiting, diarrhea,                 change in bowel habits, loss of appetite GU: No-   dysuria, change in color of urine, no  urgency or frequency.  No- flank pain. MS:  + joint pain or swelling.  No- decreased range of motion.  No- back pain. Neuro-     nothing unusual Psych:  No- change in mood or affect. No depression or anxiety.  No memory loss.  OBJ- Physical Exam General- Alert, Oriented, Affect-appropriate, Distress- none acute Skin- rash-none, lesions- none, excoriation- none Lymphadenopathy- none Head- atraumatic            Eyes- Gross vision intact, PERRLA, conjunctivae and secretions clear            Ears- Hearing, canals-normal            Nose- +rhinitis R>L, no-Septal dev, mucus+bridging, polyps, erosion, perforation             Throat- Mallampati II-III , mucosa clear , drainage- none, tonsils- atrophic Neck- flexible , trachea midline, no stridor , thyroid nl, carotid no bruit Chest - symmetrical excursion , unlabored           Heart/CV- RRR , no murmur , no gallop  , no rub, nl s1 s2                           -  JVD- none , edema- none, stasis changes- none, varices- none           Lung- clear to P&A, wheeze- none, cough+ light with deep inspiration , dullness-none,                      rub- none           Chest wall-  Abd- tender-no, distended-no, bowel sounds-present, HSM- no Br/ Gen/ Rectal- Not done, not indicated Extrem- cyanosis- none, clubbing, none, atrophy- none, strength- nl, +brace R ankle Neuro- grossly intact to observation

## 2013-10-03 NOTE — Patient Instructions (Signed)
I will be happy to see you myself again if you need. For now you can follow up with Dr Lamonte Sakai as planned.  Script printed for Dymista nasal spray. See what it would cost you. We might be able to get you scripts for the 2 components- flonase and azelastine if insurance doesn't cover [place].

## 2013-10-04 ENCOUNTER — Ambulatory Visit (INDEPENDENT_AMBULATORY_CARE_PROVIDER_SITE_OTHER): Payer: Medicare Other | Admitting: Pharmacist Clinician (PhC)/ Clinical Pharmacy Specialist

## 2013-10-04 DIAGNOSIS — D6859 Other primary thrombophilia: Secondary | ICD-10-CM

## 2013-10-04 DIAGNOSIS — D6851 Activated protein C resistance: Secondary | ICD-10-CM

## 2013-10-04 DIAGNOSIS — I82409 Acute embolism and thrombosis of unspecified deep veins of unspecified lower extremity: Secondary | ICD-10-CM

## 2013-10-04 DIAGNOSIS — Z7901 Long term (current) use of anticoagulants: Secondary | ICD-10-CM

## 2013-10-04 LAB — POCT INR: INR: 2.2

## 2013-10-16 ENCOUNTER — Encounter: Payer: Self-pay | Admitting: Emergency Medicine

## 2013-10-16 ENCOUNTER — Ambulatory Visit (INDEPENDENT_AMBULATORY_CARE_PROVIDER_SITE_OTHER): Payer: Medicare Other | Admitting: Emergency Medicine

## 2013-10-16 VITALS — BP 124/72 | HR 77 | Ht 62.0 in | Wt 132.0 lb

## 2013-10-16 DIAGNOSIS — J309 Allergic rhinitis, unspecified: Secondary | ICD-10-CM

## 2013-10-16 DIAGNOSIS — J3089 Other allergic rhinitis: Principal | ICD-10-CM

## 2013-10-16 DIAGNOSIS — J302 Other seasonal allergic rhinitis: Secondary | ICD-10-CM

## 2013-10-16 MED ORDER — AZELASTINE-FLUTICASONE 137-50 MCG/ACT NA SUSP: 2.0000 | Freq: Two times a day (BID) | NASAL | Status: DC | PRN

## 2013-10-16 NOTE — Patient Instructions (Signed)
You can try stopping your Zyrtec to see if your cough increases. If so then please restart Try using Dymista as needed for your congestion.  Follow with Dr Lamonte Sakai in 6 months or sooner if you have any problems

## 2013-10-16 NOTE — Progress Notes (Signed)
Subjective:    Patient ID: Robin Harrell, female    DOB: [birth year], 80 y.o.   MRN: [id number]  HPI  80 yo female with COPD, Chronic rhinitis, chronic cough, and GERD, hiatal hernia. 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   ROV 07/16/13 -- COPD. Chronic cough, components of GERD and PND.  She is coughing . Stopped Nexium about 3 weeks ago because she doidn't fel any better. Barium swallow 06/04/13 without overt aspiration, did have freq throat clearing and cough - they queried extra esophageal cause. Dr Janace Hoard wanted her to do pH probe, hasn't been done yet.  She is on zyrtec, ran out of flonase spray last week. She is doing the [place] daily.   ROV 10/16/13 -- Hx of COPD. Chronic cough, components of GERD and PND.  She has been doing fairly well. Saw Dr Annamaria Boots and there was no specific allergen to treat with immunotherapy. She is no longer on fluticasone, has dymista available to use prn. She is on zyrtec, wonders about stopping it for a while. She is planning for cataract sgy next week. Denies SOB.    Review of Systems As per HPI     Objective:   Physical Exam Filed Vitals:   10/16/13 0945  BP: 124/72  Pulse: 77  Height: 5\' 2"  (1.575 m)  Weight: 132 lb (59.875 kg)  SpO2: 98%    Gen: Pleasant, well-nourished, in no distress,  normal affect  ENT: No lesions,  mouth clear,  oropharynx clear,   Neck: No JVD,   Lungs: No use of accessory muscles, clear without rales or rhonchi  Cardiovascular: RRR, heart sounds normal, no murmur or gallops, no peripheral edema  Musculoskeletal: No deformities, no cyanosis or clubbing  Neuro: alert, non focal  Skin: Warm, no lesions or rashes       Assessment & Plan:   Seasonal and perennial allergic rhinitis You can try stopping your Zyrtec to see if your cough increases. If so then please restart Try using Dymista as needed for your congestion.  Follow with Dr Lamonte Sakai in 6 months or sooner if you have any problems    Updated  Medication List Outpatient Encounter Prescriptions as of 10/16/2013  Medication Sig  . alendronate (FOSAMAX) 70 MG tablet Take 1 tablet by mouth every 7 (seven) days.  Marland Kitchen atorvastatin (LIPITOR) 10 MG tablet Take 10 mg by mouth daily. Unsure of exact dose  . brimonidine (ALPHAGAN P) 0.1 % SOLN Place 1 drop into both eyes every 12 (twelve) hours.    . cetirizine (ZYRTEC) 10 MG tablet Take 1 tablet (10 mg total) by mouth daily.  . Cholecalciferol (VITAMIN D3) 2000 UNITS TABS Take 1 tablet by mouth daily.  Marland Kitchen HYDROcodone-acetaminophen (NORCO/VICODIN) 5-325 MG per tablet Take 1 tablet by mouth every 6 (six) hours as needed.   . latanoprost (XALATAN) 0.005 % ophthalmic solution 1 drop in each eye daily  . losartan (COZAAR) 100 MG tablet Take 100 mg by mouth daily.    . metoprolol tartrate (LOPRESSOR) 25 MG tablet TAKE 1 TABLET BY MOUTH ONE TIME DAILY  . multivitamin-iron-minerals-folic acid (CENTRUM) chewable tablet Chew 1 tablet by mouth daily.  . traZODone (DESYREL) 50 MG tablet 1 tablet daily.  Marland Kitchen warfarin (COUMADIN) 7.5 MG tablet TAKE 1 TABLET BY MOUTH DAILY OR AS DIRECTED.  . Azelastine-Fluticasone (DYMISTA) 137-50 MCG/ACT SUSP Place 2 sprays into both nostrils every 12 (twelve) hours as needed.  . [DISCONTINUED] Azelastine-Fluticasone (DYMISTA) 137-50 MCG/ACT SUSP 1-2  puffs each nostril once daily while needed

## 2013-10-16 NOTE — Assessment & Plan Note (Signed)
You can try stopping your Zyrtec to see if your cough increases. If so then please restart Try using Dymista as needed for your congestion.  Follow with Dr Lamonte Sakai in 6 months or sooner if you have any problems

## 2013-11-02 HISTORY — PX: CATARACT EXTRACTION W/ INTRAOCULAR LENS IMPLANT: SHX1309

## 2013-11-13 ENCOUNTER — Other Ambulatory Visit: Payer: Self-pay | Admitting: Cardiovascular Disease

## 2013-11-13 NOTE — Telephone Encounter (Signed)
Rx was sent to pharmacy electronically. Patient has OV 9/29

## 2013-11-19 ENCOUNTER — Encounter: Payer: Self-pay | Admitting: Cardiovascular Disease

## 2013-11-19 ENCOUNTER — Ambulatory Visit (INDEPENDENT_AMBULATORY_CARE_PROVIDER_SITE_OTHER): Payer: Medicare Other | Admitting: *Deleted

## 2013-11-19 ENCOUNTER — Ambulatory Visit (INDEPENDENT_AMBULATORY_CARE_PROVIDER_SITE_OTHER): Payer: Medicare Other | Admitting: Cardiovascular Disease

## 2013-11-19 VITALS — BP 122/80 | HR 76 | Ht 62.0 in | Wt 129.0 lb

## 2013-11-19 DIAGNOSIS — D6859 Other primary thrombophilia: Secondary | ICD-10-CM

## 2013-11-19 DIAGNOSIS — I10 Essential (primary) hypertension: Secondary | ICD-10-CM

## 2013-11-19 DIAGNOSIS — Z7901 Long term (current) use of anticoagulants: Secondary | ICD-10-CM

## 2013-11-19 DIAGNOSIS — D6851 Activated protein C resistance: Secondary | ICD-10-CM

## 2013-11-19 DIAGNOSIS — I82409 Acute embolism and thrombosis of unspecified deep veins of unspecified lower extremity: Secondary | ICD-10-CM

## 2013-11-19 LAB — POCT INR: INR: 1.8

## 2013-11-19 MED ORDER — METOPROLOL TARTRATE 25 MG PO TABS: 25.0000 mg | ORAL_TABLET | Freq: Every day | ORAL | Status: DC

## 2013-11-19 NOTE — Assessment & Plan Note (Signed)
Controlled on current medications 

## 2013-11-19 NOTE — Assessment & Plan Note (Signed)
On lifelong Coumadin anticoagulation followed in our clinic. History of remote pulmonary embolism

## 2013-11-19 NOTE — Patient Instructions (Signed)
Your physician recommends that you schedule a follow-up appointment in: 1 year  

## 2013-11-19 NOTE — Progress Notes (Signed)
11/19/2013 [facility]   May 23, 1933  [id number]  Primary Physician [name], MD Primary Cardiologist: Lorretta Harp MD [name]   HPI:  The patient is a 80 year old, thin-appearing, married Caucasian female, mother of 67, grandmother to 4 grandchildren who I last saw in the office on 11/19/12.Marland Kitchen She has a history of treated hypertension, hyperlipidemia, and GERD. She does have a history of pulmonary embolism in the past with factor V Leiden deficiency on life-long Coumadin anticoagulation. She has had a normal 2D echo and Myoview back in 2009. She was recently admitted to [facility] for 2 weeks with acute respiratory insufficiency requiring intubation related to pneumonia and RSV. There was a thought that she may have had congestive heart failure as well, though a 2D echo was normal. She did have a Holter monitor that showed a large number of bigeminal PVCs which she is asymptomatic from and currently is on low-dose beta blocker. She currently denies chest pain or shortness of breath.    Current Outpatient Prescriptions  Medication Sig Dispense Refill  . atorvastatin (LIPITOR) 10 MG tablet Take 10 mg by mouth daily. Unsure of exact dose      . brimonidine (ALPHAGAN P) 0.1 % SOLN Place 1 drop into both eyes every 12 (twelve) hours.        . Cholecalciferol (VITAMIN D3) 2000 UNITS TABS Take 1 tablet by mouth daily.      Marland Kitchen HYDROcodone-acetaminophen (NORCO/VICODIN) 5-325 MG per tablet Take 1 tablet by mouth every 6 (six) hours as needed.       . latanoprost (XALATAN) 0.005 % ophthalmic solution 1 drop in each eye daily      . levothyroxine (SYNTHROID, LEVOTHROID) 25 MCG tablet Take 25 mcg by mouth daily before breakfast.      . loratadine (CLARITIN) 10 MG tablet Take 10 mg by mouth daily.      Marland Kitchen losartan (COZAAR) 50 MG tablet Take 50 mg by mouth.      . metoprolol tartrate (LOPRESSOR) 25 MG tablet Take 1 tablet (25 mg total) by mouth daily.  90 tablet  3    . multivitamin-iron-minerals-folic acid (CENTRUM) chewable tablet Chew 1 tablet by mouth daily.      . prednisoLONE acetate (PRED FORTE) 1 % ophthalmic suspension Place into the right eye 3 (three) times a day.      . traZODone (DESYREL) 50 MG tablet Take 100 mg by mouth.      . warfarin (COUMADIN) 7.5 MG tablet TAKE 1 TABLET BY MOUTH DAILY OR AS DIRECTED.  90 tablet  1   No current facility-administered medications for this visit.    Allergies  Allergen Reactions  . Codeine   . Erythromycin     History   Social History  . Marital Status: Married    Spouse Name: N/A    Number of Children: 4  . Years of Education: 13.5   Occupational History  . Network engineer - retired    Social History Main Topics  . Smoking status: Former Smoker -- 1 years    Types: Cigarettes    Quit date: 02/21/1958  . Smokeless tobacco: Never Used  . Alcohol Use: No  . Drug Use: No  . Sexual Activity: Not on file   Other Topics Concern  . Not on file   Social History Narrative  . No narrative on file     Review of Systems: General: negative for chills, fever, night sweats or weight changes.  Cardiovascular:  negative for chest pain, dyspnea on exertion, edema, orthopnea, palpitations, paroxysmal nocturnal dyspnea or shortness of breath Dermatological: negative for rash Respiratory: negative for cough or wheezing Urologic: negative for hematuria Abdominal: negative for nausea, vomiting, diarrhea, bright red blood per rectum, melena, or hematemesis Neurologic: negative for visual changes, syncope, or dizziness All other systems reviewed and are otherwise negative except as noted above.    Blood pressure 122/80, pulse 76, height 5\' 2"  (1.575 m), weight 129 lb (58.514 kg).  General appearance: alert and no distress Neck: no adenopathy, no carotid bruit, no JVD, supple, symmetrical, trachea midline and thyroid not enlarged, symmetric, no tenderness/mass/nodules Lungs: clear to auscultation  bilaterally Heart: regular rate and rhythm, S1, S2 normal, no murmur, click, rub or gallop Extremities: extremities normal, atraumatic, no cyanosis or edema  EKG normal sinus rhythm at 76 without ST or T wave changes  ASSESSMENT AND PLAN:   HYPERTENSION Controlled on current medications  Hyperlipidemia On statin therapy followed by her PCP  Factor 5 Leiden mutation, heterozygous On lifelong Coumadin anticoagulation followed in our clinic. History of remote pulmonary embolism      Lorretta Harp MD [facility], [facility] 11/19/2013 10:17 AM

## 2013-11-19 NOTE — Assessment & Plan Note (Signed)
On statin therapy followed by her PCP 

## 2013-12-05 ENCOUNTER — Other Ambulatory Visit: Payer: Self-pay | Admitting: Pharmacist Clinician (PhC)/ Clinical Pharmacy Specialist

## 2013-12-11 ENCOUNTER — Ambulatory Visit (INDEPENDENT_AMBULATORY_CARE_PROVIDER_SITE_OTHER): Payer: Medicare Other | Admitting: Pharmacist Clinician (PhC)/ Clinical Pharmacy Specialist

## 2013-12-11 DIAGNOSIS — Z7901 Long term (current) use of anticoagulants: Secondary | ICD-10-CM

## 2013-12-11 DIAGNOSIS — I82409 Acute embolism and thrombosis of unspecified deep veins of unspecified lower extremity: Secondary | ICD-10-CM

## 2013-12-11 DIAGNOSIS — D6851 Activated protein C resistance: Secondary | ICD-10-CM

## 2013-12-11 DIAGNOSIS — D688 Other specified coagulation defects: Secondary | ICD-10-CM

## 2013-12-11 LAB — POCT INR: INR: 1.5

## 2013-12-18 ENCOUNTER — Ambulatory Visit (INDEPENDENT_AMBULATORY_CARE_PROVIDER_SITE_OTHER): Payer: Medicare Other | Admitting: Pharmacist Clinician (PhC)/ Clinical Pharmacy Specialist

## 2013-12-18 DIAGNOSIS — I82409 Acute embolism and thrombosis of unspecified deep veins of unspecified lower extremity: Secondary | ICD-10-CM

## 2013-12-18 DIAGNOSIS — D688 Other specified coagulation defects: Secondary | ICD-10-CM

## 2013-12-18 DIAGNOSIS — D6851 Activated protein C resistance: Secondary | ICD-10-CM

## 2013-12-18 DIAGNOSIS — Z7901 Long term (current) use of anticoagulants: Secondary | ICD-10-CM

## 2013-12-18 LAB — POCT INR: INR: 2.1

## 2014-01-01 ENCOUNTER — Ambulatory Visit (INDEPENDENT_AMBULATORY_CARE_PROVIDER_SITE_OTHER): Payer: Medicare Other | Admitting: Pharmacist Clinician (PhC)/ Clinical Pharmacy Specialist

## 2014-01-01 DIAGNOSIS — D6851 Activated protein C resistance: Secondary | ICD-10-CM

## 2014-01-01 DIAGNOSIS — I82409 Acute embolism and thrombosis of unspecified deep veins of unspecified lower extremity: Secondary | ICD-10-CM

## 2014-01-01 DIAGNOSIS — D688 Other specified coagulation defects: Secondary | ICD-10-CM

## 2014-01-01 DIAGNOSIS — Z7901 Long term (current) use of anticoagulants: Secondary | ICD-10-CM

## 2014-01-01 LAB — POCT INR: INR: 1.8

## 2014-01-22 ENCOUNTER — Ambulatory Visit (INDEPENDENT_AMBULATORY_CARE_PROVIDER_SITE_OTHER): Payer: Medicare Other | Admitting: Pharmacist Clinician (PhC)/ Clinical Pharmacy Specialist

## 2014-01-22 DIAGNOSIS — Z7901 Long term (current) use of anticoagulants: Secondary | ICD-10-CM

## 2014-01-22 DIAGNOSIS — D6851 Activated protein C resistance: Secondary | ICD-10-CM

## 2014-01-22 DIAGNOSIS — I82409 Acute embolism and thrombosis of unspecified deep veins of unspecified lower extremity: Secondary | ICD-10-CM

## 2014-01-22 DIAGNOSIS — D688 Other specified coagulation defects: Secondary | ICD-10-CM

## 2014-01-22 LAB — POCT INR: INR: 2.7

## 2014-02-16 ENCOUNTER — Other Ambulatory Visit: Payer: Self-pay | Admitting: Cardiovascular Disease

## 2014-02-17 NOTE — Telephone Encounter (Signed)
Rx refill denied to patient pharmacy  Refill requested too soon.

## 2014-02-19 ENCOUNTER — Ambulatory Visit (INDEPENDENT_AMBULATORY_CARE_PROVIDER_SITE_OTHER): Payer: Medicare Other | Admitting: Pharmacist Clinician (PhC)/ Clinical Pharmacy Specialist

## 2014-02-19 DIAGNOSIS — Z7901 Long term (current) use of anticoagulants: Secondary | ICD-10-CM

## 2014-02-19 DIAGNOSIS — I82409 Acute embolism and thrombosis of unspecified deep veins of unspecified lower extremity: Secondary | ICD-10-CM

## 2014-02-19 DIAGNOSIS — D6851 Activated protein C resistance: Secondary | ICD-10-CM

## 2014-02-19 DIAGNOSIS — D688 Other specified coagulation defects: Secondary | ICD-10-CM

## 2014-02-19 LAB — POCT INR: INR: 2.5

## 2014-03-25 ENCOUNTER — Other Ambulatory Visit: Payer: Self-pay | Admitting: Emergency Medicine

## 2014-04-04 ENCOUNTER — Ambulatory Visit (INDEPENDENT_AMBULATORY_CARE_PROVIDER_SITE_OTHER): Payer: Medicare Other | Admitting: Pharmacist Clinician (PhC)/ Clinical Pharmacy Specialist

## 2014-04-04 DIAGNOSIS — I82409 Acute embolism and thrombosis of unspecified deep veins of unspecified lower extremity: Secondary | ICD-10-CM

## 2014-04-04 DIAGNOSIS — D688 Other specified coagulation defects: Secondary | ICD-10-CM

## 2014-04-04 DIAGNOSIS — Z7901 Long term (current) use of anticoagulants: Secondary | ICD-10-CM

## 2014-04-04 DIAGNOSIS — D6851 Activated protein C resistance: Secondary | ICD-10-CM

## 2014-04-04 LAB — POCT INR: INR: 2.6

## 2014-04-21 ENCOUNTER — Telehealth: Payer: Self-pay | Admitting: Emergency Medicine

## 2014-04-21 MED ORDER — AZITHROMYCIN 250 MG PO TABS: ORAL_TABLET | ORAL | Status: DC

## 2014-04-21 NOTE — Telephone Encounter (Signed)
Patient says she is coughing up thick green mucus, left eye is stinging, both eyes are watery, PND.  No fever. Uses [facility] [address].  Patient says she went back on the Cetrizine and Dymista.  It was working well for a while, she thinks it may have run its course.  Dr. Lamonte Sakai, please advise.

## 2014-04-21 NOTE — Telephone Encounter (Signed)
Have her take azithromycin z pack in case this is a bronchitis.  Continue the cetirazine and dymista

## 2014-04-21 NOTE — Telephone Encounter (Signed)
Pt aware of recs.  Nothing further needed. 

## 2014-04-22 ENCOUNTER — Telehealth: Payer: Self-pay | Admitting: Emergency Medicine

## 2014-04-22 MED ORDER — DOXYCYCLINE HYCLATE 100 MG PO TABS: 100.0000 mg | ORAL_TABLET | Freq: Two times a day (BID) | ORAL | Status: DC

## 2014-04-22 NOTE — Telephone Encounter (Signed)
I believe she has tolerated azithro before, but we can change it > order doxy 100mg  bid x 7 days. ANY of the abx we prescribe are going to interact w coumadin. She needs to notify whomever follows her INR that an abx has been started so they can follow and adjust.

## 2014-04-22 NOTE — Telephone Encounter (Signed)
Spoke with pharmacist, states z-pak prescribed yesterday has 2 interactions- 1. allergic to erythromycin 2. Interacts with her warfarin.  RB please advise on an alternative.  Thanks!

## 2014-04-22 NOTE — Telephone Encounter (Signed)
Took zpak off med list and e-scribed doxy as prescribed.  Pt aware.  Nothing further needed.

## 2014-04-22 NOTE — Telephone Encounter (Signed)
Spoke with pharmacist, he states doxy will interact with pt's coumadin.  Advised pharmacist of RB's recs from last phone message:   believe she has tolerated azithro before, but we can change it > order doxy 100mg  bid x 7 days. ANY of the abx we prescribe are going to interact w coumadin. She needs to notify whomever follows her INR that an abx has been started so they can follow and adjust.   He will fill doxy.  Nothing further needed.

## 2014-05-06 ENCOUNTER — Ambulatory Visit: Payer: Medicare Other | Admitting: Emergency Medicine

## 2014-05-08 ENCOUNTER — Ambulatory Visit (INDEPENDENT_AMBULATORY_CARE_PROVIDER_SITE_OTHER): Payer: Medicare Other | Admitting: Emergency Medicine

## 2014-05-08 ENCOUNTER — Encounter: Payer: Self-pay | Admitting: Emergency Medicine

## 2014-05-08 VITALS — BP 116/62 | HR 92 | Ht 63.5 in | Wt 139.0 lb

## 2014-05-08 DIAGNOSIS — J309 Allergic rhinitis, unspecified: Secondary | ICD-10-CM | POA: Diagnosis not present

## 2014-05-08 DIAGNOSIS — R059 Cough, unspecified: Secondary | ICD-10-CM

## 2014-05-08 DIAGNOSIS — R05 Cough: Secondary | ICD-10-CM | POA: Diagnosis not present

## 2014-05-08 DIAGNOSIS — J302 Other seasonal allergic rhinitis: Secondary | ICD-10-CM

## 2014-05-08 DIAGNOSIS — J3089 Other allergic rhinitis: Secondary | ICD-10-CM

## 2014-05-08 NOTE — Assessment & Plan Note (Addendum)
Her allergic rhinitis is poorly controlled at this time. We will add chlorpheniramine and potentially Benadryl if she still has symptoms. She will continue her cetirizine and dymista for now. Continue nasal saline washes. Follow up in 2 months to assess her progress. If her cough continues in this fashion that I'm going to repeat her pulmonary function testing at that time

## 2014-05-08 NOTE — Progress Notes (Signed)
Subjective:    Patient ID: Robin Harrell, female    DOB: 13-Nov-1933, 81 y.o.   MRN: [id number]  HPI  81 yo female with COPD, Chronic rhinitis, chronic cough, and GERD, hiatal hernia. 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   ROV 07/16/13 -- COPD. Chronic cough, components of GERD and PND.  She is coughing . Stopped Nexium about 3 weeks ago because she doidn't fel any better. Barium swallow 06/04/13 without overt aspiration, did have freq throat clearing and cough - they queried extra esophageal cause. Dr Janace Hoard wanted her to do pH probe, hasn't been done yet.  She is on zyrtec, ran out of flonase spray last week. She is doing the [place] daily.   ROV 10/16/13 -- Hx of COPD. Chronic cough, components of GERD and PND.  She has been doing fairly well. Saw Dr Annamaria Boots and there was no specific allergen to treat with immunotherapy. She is no longer on fluticasone, has dymista available to use prn. She is on zyrtec, wonders about stopping it for a while. She is planning for cataract sgy next week. Denies SOB.   ROV 05/08/14 -- Follow up visit for chronic cough associated with allergic rhinitis, GERD. She carries a history of possible mild obstructive disease although she had reassuring pulmonary function testing in 2010.  She has been experiencing more nasal congestion, more cough over last few months. She went back on her dymista and cetirizine. Has been having more eye watering and sneezing. I went back and reviewed her primary function testing from 2010 today. This did not show any significant obstructive lung disease.    Review of Systems As per HPI     Objective:   Physical Exam Filed Vitals:   05/08/14 1407  BP: 116/62  Pulse: 92  Height: 5' 3.5" (1.613 m)  Weight: 139 lb (63.05 kg)  SpO2: 98%    Gen: Pleasant, well-nourished, in no distress,  normal affect  ENT: No lesions,  mouth clear,  oropharynx clear,   Neck: No JVD,   Lungs: No use of accessory muscles, clear without  rales or rhonchi  Cardiovascular: RRR, heart sounds normal, no murmur or gallops, no peripheral edema  Musculoskeletal: No deformities, no cyanosis or clubbing  Neuro: alert, non focal  Skin: Warm, no lesions or rashes       Assessment & Plan:   COUGH, CHRONIC This is long-standing and appears to be flaring at this time in the setting of progressive allergic rhinitis. She wisely restarted her dymista and cetirizine as well as her nasal saline washes. These have helped some but she still having more symptoms. We will try to ramp up her therapy for allergic rhinitis as above.    Seasonal and perennial allergic rhinitis Her allergic rhinitis is poorly controlled at this time. We will add chlorpheniramine and potentially Benadryl if she still has symptoms. She will continue her cetirizine and dymista for now. Continue nasal saline washes. Follow up in 2 months to assess her progress. If her cough continues in this fashion that I'm going to repeat her pulmonary function testing at that time     Updated Medication List Outpatient Encounter Prescriptions as of 05/08/2014  Medication Sig  . atorvastatin (LIPITOR) 10 MG tablet Take 10 mg by mouth daily. Unsure of exact dose  . brimonidine (ALPHAGAN P) 0.1 % SOLN Place 1 drop into both eyes every 12 (twelve) hours.    . Calcium Carb-Cholecalciferol (CALCIUM + D3 PO) Take 2  tablets by mouth 2 (two) times daily.  . cetirizine (ZYRTEC) 10 MG tablet TAKE 1 TABLET BY MOUTH DAILY  . DYMISTA 137-50 MCG/ACT SUSP Use as needed  . latanoprost (XALATAN) 0.005 % ophthalmic solution 1 drop in each eye daily  . loratadine (CLARITIN) 10 MG tablet Take 10 mg by mouth daily.  Marland Kitchen losartan (COZAAR) 50 MG tablet Take 50 mg by mouth.  . metoprolol tartrate (LOPRESSOR) 25 MG tablet Take 1 tablet (25 mg total) by mouth daily.  . multivitamin-iron-minerals-folic acid (CENTRUM) chewable tablet Chew 1 tablet by mouth daily.  . traZODone (DESYREL) 50 MG tablet Take  100 mg by mouth.  . warfarin (COUMADIN) 7.5 MG tablet TAKE 1 TABLET BY MOUTH EVERY DAY OR AS DIRECTED  . [DISCONTINUED] doxycycline (VIBRA-TABS) 100 MG tablet Take 1 tablet (100 mg total) by mouth 2 (two) times daily.

## 2014-05-08 NOTE — Patient Instructions (Signed)
Please continue your cetirizine and dymista, and your nasal saline  We will add chlorpheniramine 4mg  as needed up to every 6 hours We can consider adding benadryl 12.5 - 25mg  daily if you continue to have symptoms.  We will not start any new inhaled medications at this time.  Follow with Dr Lamonte Sakai in 2 months or sooner if you have any problems.

## 2014-05-08 NOTE — Assessment & Plan Note (Signed)
This is long-standing and appears to be flaring at this time in the setting of progressive allergic rhinitis. She wisely restarted her dymista and cetirizine as well as her nasal saline washes. These have helped some but she still having more symptoms. We will try to ramp up her therapy for allergic rhinitis as above.

## 2014-05-16 ENCOUNTER — Ambulatory Visit (INDEPENDENT_AMBULATORY_CARE_PROVIDER_SITE_OTHER): Payer: Medicare Other | Admitting: Pharmacist Clinician (PhC)/ Clinical Pharmacy Specialist

## 2014-05-16 DIAGNOSIS — D688 Other specified coagulation defects: Secondary | ICD-10-CM

## 2014-05-16 DIAGNOSIS — Z7901 Long term (current) use of anticoagulants: Secondary | ICD-10-CM | POA: Diagnosis not present

## 2014-05-16 DIAGNOSIS — D6851 Activated protein C resistance: Secondary | ICD-10-CM

## 2014-05-16 DIAGNOSIS — I82409 Acute embolism and thrombosis of unspecified deep veins of unspecified lower extremity: Secondary | ICD-10-CM | POA: Diagnosis not present

## 2014-05-16 LAB — POCT INR: INR: 1.9

## 2014-05-21 ENCOUNTER — Other Ambulatory Visit: Payer: Self-pay | Admitting: Emergency Medicine

## 2014-05-27 ENCOUNTER — Other Ambulatory Visit: Payer: Self-pay | Admitting: Pharmacist Clinician (PhC)/ Clinical Pharmacy Specialist

## 2014-05-28 ENCOUNTER — Other Ambulatory Visit (HOSPITAL_COMMUNITY): Payer: Self-pay | Admitting: Family Medicine

## 2014-05-28 DIAGNOSIS — Z1231 Encounter for screening mammogram for malignant neoplasm of breast: Secondary | ICD-10-CM

## 2014-05-29 ENCOUNTER — Other Ambulatory Visit: Payer: Self-pay

## 2014-05-30 MED ORDER — WARFARIN SODIUM 7.5 MG PO TABS: ORAL_TABLET | ORAL | Status: DC

## 2014-06-12 ENCOUNTER — Ambulatory Visit (INDEPENDENT_AMBULATORY_CARE_PROVIDER_SITE_OTHER): Payer: Medicare Other | Admitting: Pharmacist Clinician (PhC)/ Clinical Pharmacy Specialist

## 2014-06-12 DIAGNOSIS — Z7901 Long term (current) use of anticoagulants: Secondary | ICD-10-CM | POA: Diagnosis not present

## 2014-06-12 DIAGNOSIS — I82409 Acute embolism and thrombosis of unspecified deep veins of unspecified lower extremity: Secondary | ICD-10-CM | POA: Diagnosis not present

## 2014-06-12 DIAGNOSIS — D688 Other specified coagulation defects: Secondary | ICD-10-CM | POA: Diagnosis not present

## 2014-06-12 DIAGNOSIS — D6851 Activated protein C resistance: Secondary | ICD-10-CM

## 2014-06-12 LAB — POCT INR: INR: 1.9

## 2014-06-13 ENCOUNTER — Other Ambulatory Visit: Payer: Self-pay | Admitting: Emergency Medicine

## 2014-06-22 ENCOUNTER — Other Ambulatory Visit: Payer: Self-pay | Admitting: Pharmacist Clinician (PhC)/ Clinical Pharmacy Specialist

## 2014-06-26 ENCOUNTER — Ambulatory Visit (INDEPENDENT_AMBULATORY_CARE_PROVIDER_SITE_OTHER): Payer: Medicare Other | Admitting: Pharmacist Clinician (PhC)/ Clinical Pharmacy Specialist

## 2014-06-26 DIAGNOSIS — D6851 Activated protein C resistance: Secondary | ICD-10-CM

## 2014-06-26 DIAGNOSIS — Z7901 Long term (current) use of anticoagulants: Secondary | ICD-10-CM

## 2014-06-26 DIAGNOSIS — I82409 Acute embolism and thrombosis of unspecified deep veins of unspecified lower extremity: Secondary | ICD-10-CM

## 2014-06-26 DIAGNOSIS — D688 Other specified coagulation defects: Secondary | ICD-10-CM

## 2014-06-26 LAB — POCT INR: INR: 2.4

## 2014-07-03 ENCOUNTER — Ambulatory Visit (HOSPITAL_COMMUNITY)
Admission: RE | Admit: 2014-07-03 | Discharge: 2014-07-03 | Disposition: A | Discharge status: Home / Self Care | Payer: Medicare Other | Source: Ambulatory Visit | Attending: Family Medicine | Admitting: Family Medicine

## 2014-07-03 DIAGNOSIS — Z1231 Encounter for screening mammogram for malignant neoplasm of breast: Secondary | ICD-10-CM | POA: Diagnosis present

## 2014-07-15 ENCOUNTER — Ambulatory Visit: Payer: Medicare Other | Admitting: Emergency Medicine

## 2014-07-18 ENCOUNTER — Ambulatory Visit: Payer: Medicare Other | Admitting: Emergency Medicine

## 2014-07-22 ENCOUNTER — Ambulatory Visit (INDEPENDENT_AMBULATORY_CARE_PROVIDER_SITE_OTHER): Payer: Medicare Other

## 2014-07-22 ENCOUNTER — Ambulatory Visit (INDEPENDENT_AMBULATORY_CARE_PROVIDER_SITE_OTHER): Payer: Medicare Other | Admitting: Podiatry

## 2014-07-22 DIAGNOSIS — M2042 Other hammer toe(s) (acquired), left foot: Secondary | ICD-10-CM

## 2014-07-22 DIAGNOSIS — M2011 Hallux valgus (acquired), right foot: Secondary | ICD-10-CM | POA: Diagnosis not present

## 2014-07-22 NOTE — Progress Notes (Signed)
   Subjective:    Patient ID: Robin Harrell, female    DOB: [birth year], 81 y.o.   MRN: [id number]  HPI PT STATED RT FOOT BUNION IS BEEN PAINFUL FOR 2 YEARS AND WOULD LIKE TO GET THE SURGERY DONE. THE FOOT IS GETTING WORSE ESPECIALLY WHEN WEARING CLOSED SHOES. TRIED NO TREATMENT.  ALSO, LT FOOT 2ND TOE IS OVERLAPPING TO THE OTHER TOE.   Review of Systems  Musculoskeletal: Positive for back pain.       Objective:   Physical Exam: I have reviewed her past medical history medications allergies surgery social history review of systems. Pulses are strongly palpable bilateral. Neurologic sensorium is intact per Semmes-Weinstein monofilament. Deep tendon reflexes are intact bilateral and muscle strength +5 over 5 dorsiflexion plantar flexion inverters and everters all intrinsic musculature is intact. Orthopedic evaluation demonstrates moderate to severe hallux abductovalgus deformity of the right foot. Previous surgery to toes 2 through 5 of the right foot healed them rectus. Frontal plane range of motion is most significant with hallux abductovalgus deformity of the right foot. Radiographs demonstrate an IM angle of greater than 15. Left foot demonstrates mild hallux valgus deformity with hammertoe deformity second left. Cutaneous evaluation demonstrates supple well-hydrated cutis hyperpigmentation associated with hemosiderin deposition and chronic edema.        Assessment & Plan:  Assessment: History of factor V deficiency. She currently takes Coumadin regularly she also has a cardiac condition the results and necessity for antibiotic-coated prophylaxis. 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 Patient is requesting surgical intervention. Hammertoe deformity second left with mild hallux should he see fit left.  Plan: I discussed the etiology pathology conservative versus surgical therapies with her. At this point she is requesting surgical intervention to her right foot at least. I  have suggested that she consider letting Dr. Jacqualyn Posey to correct her valgus deformity should he see fit. I explained to her that he may wish to do this in a hospital setting or at home today because of the factor V deficiency and that she will have to switch over to Lovenox. I am scheduling this patient to see Dr. Jacqualyn Posey next week for consult and it is solely his decision as to whether or not surgery should be considered.

## 2014-07-24 ENCOUNTER — Ambulatory Visit: Payer: Medicare Other | Admitting: Pharmacist Clinician (PhC)/ Clinical Pharmacy Specialist

## 2014-07-25 ENCOUNTER — Ambulatory Visit (INDEPENDENT_AMBULATORY_CARE_PROVIDER_SITE_OTHER): Payer: Medicare Other | Admitting: Pharmacist Clinician (PhC)/ Clinical Pharmacy Specialist

## 2014-07-25 DIAGNOSIS — Z7901 Long term (current) use of anticoagulants: Secondary | ICD-10-CM

## 2014-07-25 DIAGNOSIS — D688 Other specified coagulation defects: Secondary | ICD-10-CM

## 2014-07-25 DIAGNOSIS — I82409 Acute embolism and thrombosis of unspecified deep veins of unspecified lower extremity: Secondary | ICD-10-CM | POA: Diagnosis not present

## 2014-07-25 DIAGNOSIS — D6851 Activated protein C resistance: Secondary | ICD-10-CM

## 2014-07-25 LAB — POCT INR: INR: 2.5

## 2014-07-28 ENCOUNTER — Ambulatory Visit (INDEPENDENT_AMBULATORY_CARE_PROVIDER_SITE_OTHER): Payer: Medicare Other | Admitting: Emergency Medicine

## 2014-07-28 ENCOUNTER — Encounter: Payer: Self-pay | Admitting: Emergency Medicine

## 2014-07-28 VITALS — BP 128/70 | HR 97 | Wt 133.0 lb

## 2014-07-28 DIAGNOSIS — K219 Gastro-esophageal reflux disease without esophagitis: Secondary | ICD-10-CM | POA: Diagnosis not present

## 2014-07-28 DIAGNOSIS — R05 Cough: Secondary | ICD-10-CM | POA: Diagnosis not present

## 2014-07-28 DIAGNOSIS — J302 Other seasonal allergic rhinitis: Secondary | ICD-10-CM

## 2014-07-28 DIAGNOSIS — J3089 Other allergic rhinitis: Secondary | ICD-10-CM

## 2014-07-28 DIAGNOSIS — R059 Cough, unspecified: Secondary | ICD-10-CM

## 2014-07-28 DIAGNOSIS — J309 Allergic rhinitis, unspecified: Secondary | ICD-10-CM

## 2014-07-28 MED ORDER — OMEPRAZOLE 20 MG PO CPDR: 20.0000 mg | DELAYED_RELEASE_CAPSULE | Freq: Two times a day (BID) | ORAL | Status: DC

## 2014-07-28 NOTE — Assessment & Plan Note (Signed)
She is on a good regimen for her allergic rhinitis although she still has breakthrough symptoms. We discussed the medications and their appropriate use. She is not on anything for GERD right now and I question whether this may be part of her upper airway irritation. I will restart GERD therapy today

## 2014-07-28 NOTE — Progress Notes (Signed)
Subjective:    Patient ID: Robin Harrell, female    DOB: 05/29/1933, 81 y.o.   MRN: [id number]  HPI  81 yo female with COPD, Chronic rhinitis, chronic cough, and GERD, hiatal hernia. 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   ROV 07/16/13 -- COPD. Chronic cough, components of GERD and PND.  She is coughing . Stopped Nexium about 3 weeks ago because she doidn't fel any better. Barium swallow 06/04/13 without overt aspiration, did have freq throat clearing and cough - they queried extra esophageal cause. Dr Janace Hoard wanted her to do pH probe, hasn't been done yet.  She is on zyrtec, ran out of flonase spray last week. She is doing the [place] daily.   ROV 10/16/13 -- Hx of COPD. Chronic cough, components of GERD and PND.  She has been doing fairly well. Saw Dr Annamaria Boots and there was no specific allergen to treat with immunotherapy. She is no longer on fluticasone, has dymista available to use prn. She is on zyrtec, wonders about stopping it for a while. She is planning for cataract sgy next week. Denies SOB.   ROV 05/08/14 -- Follow up visit for chronic cough associated with allergic rhinitis, GERD. She carries a history of possible mild obstructive disease although she had reassuring pulmonary function testing in 2010.  She has been experiencing more nasal congestion, more cough over last few months. She went back on her dymista and cetirizine. Has been having more eye watering and sneezing. I went back and reviewed her primary function testing from 2010 today. This did not show any significant obstructive lung disease.  ROV 07/28/14 -- history of chronic cough that is associated with allergic rhinitis and GERD. At her last visit her cough was still very bothersome. I added chlorpheniramine to her existing nasal saline washes, dymista, cetirizine. Also discussed adding Benadryl . She is not on anything for GERD. She returns today with some slight improvement in her cough, but continued to have allergy  trouble.    Review of Systems As per HPI     Objective:   Physical Exam Filed Vitals:   07/28/14 1433  BP: 128/70  Pulse: 97  Weight: 133 lb (60.328 kg)  SpO2: 99%    Gen: Pleasant, well-nourished, in no distress,  normal affect  ENT: No lesions,  mouth clear,  oropharynx clear,   Neck: No JVD,   Lungs: No use of accessory muscles, clear without rales or rhonchi  Cardiovascular: RRR, heart sounds normal, no murmur or gallops, no peripheral edema  Musculoskeletal: No deformities, no cyanosis or clubbing  Neuro: alert, non focal  Skin: Warm, no lesions or rashes       Assessment & Plan:   COUGH, CHRONIC She is on a good regimen for her allergic rhinitis although she still has breakthrough symptoms. We discussed the medications and their appropriate use. She is not on anything for GERD right now and I question whether this may be part of her upper airway irritation. I will restart GERD therapy today   GERD Restart omeprazole 20 mg twice a day   Seasonal and perennial allergic rhinitis She continues to have breakthrough symptoms especially resulting in throat clearing and mild cough. I would like to continue her cetirizine, chlorpheniramine, dymista, nasal saline washes. She has been evaluated with allergy testing but there is no role for immunotherapy     Updated Medication List Outpatient Encounter Prescriptions as of 07/28/2014  Medication Sig  . atorvastatin (LIPITOR)  10 MG tablet Take 10 mg by mouth daily. Unsure of exact dose  . brimonidine (ALPHAGAN P) 0.1 % SOLN Place 1 drop into both eyes every 12 (twelve) hours.    . Calcium Carb-Cholecalciferol (CALCIUM + D3 PO) Take 2 tablets by mouth 2 (two) times daily.  . cetirizine (ZYRTEC) 10 MG tablet TAKE 1 TABLET BY MOUTH EVERY DAY  . DYMISTA 137-50 MCG/ACT SUSP PLACE 2 SPRAYS INTO BOTH NOSTRILS EVERY 12 HOURS AS NEEDED  . HYDROcodone-acetaminophen (NORCO/VICODIN) 5-325 MG per tablet   . latanoprost (XALATAN)  0.005 % ophthalmic solution 1 drop in each eye daily  . levothyroxine (SYNTHROID, LEVOTHROID) 25 MCG tablet Take 25 mcg by mouth daily.  Marland Kitchen loratadine (CLARITIN) 10 MG tablet Take 10 mg by mouth daily.  Marland Kitchen losartan (COZAAR) 50 MG tablet Take 50 mg by mouth.  . metoprolol tartrate (LOPRESSOR) 25 MG tablet Take 1 tablet (25 mg total) by mouth daily.  . multivitamin-iron-minerals-folic acid (CENTRUM) chewable tablet Chew 1 tablet by mouth daily.  . traZODone (DESYREL) 50 MG tablet Take 100 mg by mouth.  . warfarin (COUMADIN) 7.5 MG tablet TAKE 1 TABLET BY MOUTH EVERY DAY OR AS DIRECTED  . omeprazole (PRILOSEC) 20 MG capsule Take 1 capsule (20 mg total) by mouth 2 (two) times daily before a meal.  . [DISCONTINUED] DYMISTA 137-50 MCG/ACT SUSP Use as needed  . [DISCONTINUED] warfarin (COUMADIN) 7.5 MG tablet TAKE 1 TABLET BY MOUTH EVERY DAY OR AS DIRECTED   No facility-administered encounter medications on file as of 07/28/2014.

## 2014-07-28 NOTE — Patient Instructions (Addendum)
Please continue your medications as you have been taking them - nasal saline, dymista, cetrizine, chlorpheniramine.  Please start omeprazole 20mg  twice a day  We will not check your breathing tests or start an inhaler at this time  Follow with Dr Lamonte Sakai in 3 months or sooner if you have any problems.

## 2014-07-28 NOTE — Assessment & Plan Note (Signed)
She continues to have breakthrough symptoms especially resulting in throat clearing and mild cough. I would like to continue her cetirizine, chlorpheniramine, dymista, nasal saline washes. She has been evaluated with allergy testing but there is no role for immunotherapy

## 2014-07-28 NOTE — Assessment & Plan Note (Signed)
Restart omeprazole 20 mg twice a day

## 2014-08-01 ENCOUNTER — Ambulatory Visit (INDEPENDENT_AMBULATORY_CARE_PROVIDER_SITE_OTHER): Payer: Medicare Other | Admitting: Podiatry

## 2014-08-01 ENCOUNTER — Encounter: Payer: Self-pay | Admitting: Podiatry

## 2014-08-01 VITALS — BP 146/87 | HR 88 | Resp 12

## 2014-08-01 DIAGNOSIS — M2011 Hallux valgus (acquired), right foot: Secondary | ICD-10-CM | POA: Diagnosis not present

## 2014-08-07 NOTE — Progress Notes (Signed)
Patient ID: Robin Harrell, female   DOB: 1933/07/03, 81 y.o.   MRN: [id number]  Subjective: 81 year old female presents to the office for surgical consultation of right foot bunion and hammertoe from Dr. Milinda Pointer as the patient is on coumadin for factor V deficiency. She states that she has had pain in the bunion second toe for approximate 2 years. She states that she has attempted conservative treatment including shoe gear modifications, padding, offloading without any resolution. At this time she is requesting surgical intervention to help decrease her pain and deformity.  Objective: AAO x3, NAD DP/PT pulses palpable bilaterally, CRT less than 3 seconds Protective sensation intact with Simms Weinstein monofilament, vibratory sensation intact, Achilles tendon reflex intact There is moderate structural HAV deformity present to the right lower extremity with tenderness palpation along the medial aspect of the first metatarsal head. There is slight irritation over this area from shoe gear. There is no pain or crepitation first MTPJ range of motion. The second digit this. The overlapping the hallux there is tenderness along the dorsal aspect of the digit on the PIPJ. There is no other areas of tenderness to bilateral lower extremities. MMT 5/5, ROM WNL.  No open lesions or pre-ulcerative lesions.  No overlying edema, erythema, increase in warmth to bilateral lower extremities.  No pain with calf compression, swelling, warmth, erythema bilaterally.   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 -Treatment options discussed including all alternatives, risks, and complications -I discussed both conservative and surgical options. -She did undergo surgical intervention discussed the possible bunion ectomy screw fixation, hammertoe repair second digit right foot. -Prior to surgery patient will need to have medical  clearance from both Dr. Gwenlyn Found and her PCP. She would need to be off coumadin prior to surgery. Doing so puts her at high risk of complications. If cleared for surgery, will discuss in more detail.

## 2014-08-26 ENCOUNTER — Encounter: Payer: Self-pay | Admitting: *Deleted

## 2014-08-28 ENCOUNTER — Telehealth: Payer: Self-pay | Admitting: *Deleted

## 2014-08-28 NOTE — Telephone Encounter (Signed)
Requesting surgical clearance:   1. Type of surgery: Austin Bunionectomy under local / IV sedation  2. Surgeon: Dr Celesta Gentile  3. Surgical date: TBA  4. Medications that need to be help: Coumadin  5. CAD: No.  Does have a history of pulmonary embolism in the past with factor V Leiden deficiency on life-long Coumadin anticoagulation. She has had a normal 2D echo and Myoview back in 2009.       6. I will defer to: Dr Gwenlyn Found

## 2014-08-29 NOTE — Telephone Encounter (Signed)
Because there is a history of pulmonary and was him and Dr. 5 Leiden deficiency the patient will need Lovenox bridging

## 2014-09-01 ENCOUNTER — Ambulatory Visit (INDEPENDENT_AMBULATORY_CARE_PROVIDER_SITE_OTHER): Payer: Medicare Other | Admitting: Pharmacist Clinician (PhC)/ Clinical Pharmacy Specialist

## 2014-09-01 DIAGNOSIS — Z7901 Long term (current) use of anticoagulants: Secondary | ICD-10-CM | POA: Diagnosis not present

## 2014-09-01 DIAGNOSIS — D688 Other specified coagulation defects: Secondary | ICD-10-CM

## 2014-09-01 DIAGNOSIS — I82409 Acute embolism and thrombosis of unspecified deep veins of unspecified lower extremity: Secondary | ICD-10-CM

## 2014-09-01 DIAGNOSIS — D6851 Activated protein C resistance: Secondary | ICD-10-CM

## 2014-09-01 LAB — POCT INR: INR: 1.8

## 2014-09-01 NOTE — Telephone Encounter (Signed)
lmom 

## 2014-09-01 NOTE — Telephone Encounter (Signed)
I will defer to [facility] for instructions on bridging.    I left message on patient's machine for her to call me so that I can explain that lovenox bridging is needed.   I will route the message to Dr Jacqualyn Posey so that he can be aware of the situation and to let [name] know the date of the procedure so that she can proceed with bridging instructions.

## 2014-09-01 NOTE — Telephone Encounter (Signed)
Pt is returning your call. Thanks

## 2014-09-01 NOTE — Telephone Encounter (Signed)
I spoke with patient and explained the need for lovenox bridging.  She will contact Erasmo Downer when she has a surgical date.

## 2014-09-02 DIAGNOSIS — E039 Hypothyroidism, unspecified: Secondary | ICD-10-CM | POA: Insufficient documentation

## 2014-09-04 ENCOUNTER — Telehealth: Payer: Self-pay | Admitting: *Deleted

## 2014-09-04 NOTE — Telephone Encounter (Signed)
Medical clearance letter sent to Dr. Gwenlyn Found at his [place] address.

## 2014-09-15 ENCOUNTER — Ambulatory Visit (INDEPENDENT_AMBULATORY_CARE_PROVIDER_SITE_OTHER): Payer: Medicare Other | Admitting: Pharmacist Clinician (PhC)/ Clinical Pharmacy Specialist

## 2014-09-15 DIAGNOSIS — D688 Other specified coagulation defects: Secondary | ICD-10-CM | POA: Diagnosis not present

## 2014-09-15 DIAGNOSIS — Z7901 Long term (current) use of anticoagulants: Secondary | ICD-10-CM

## 2014-09-15 DIAGNOSIS — D6851 Activated protein C resistance: Secondary | ICD-10-CM

## 2014-09-15 DIAGNOSIS — I82409 Acute embolism and thrombosis of unspecified deep veins of unspecified lower extremity: Secondary | ICD-10-CM | POA: Diagnosis not present

## 2014-09-15 LAB — POCT INR: INR: 5.3

## 2014-09-18 ENCOUNTER — Telehealth: Payer: Self-pay | Admitting: *Deleted

## 2014-09-18 NOTE — Telephone Encounter (Signed)
I'm returning your call.  "I received a call from someone yesterday and today but I missed both calls.  I'm supposed to have surgery but prior to having surgery I have to wean off Coumadin and give myself shots.  I don't know where I'm to get the shots."  We were calling to get you in for an appointment for a consultation.  When you come in, Dr. Jacqualyn Posey will advise you on how to proceed.  "Okay, I see.  When can I get in?"  Let me transfer you to a scheduler.

## 2014-09-18 NOTE — Telephone Encounter (Signed)
"  I got a call from one of the nurses this morning regarding surgery with Dr. Jacqualyn Posey."

## 2014-09-26 ENCOUNTER — Ambulatory Visit (INDEPENDENT_AMBULATORY_CARE_PROVIDER_SITE_OTHER): Payer: Medicare Other | Admitting: Pharmacist Clinician (PhC)/ Clinical Pharmacy Specialist

## 2014-09-26 DIAGNOSIS — D6851 Activated protein C resistance: Secondary | ICD-10-CM

## 2014-09-26 DIAGNOSIS — D688 Other specified coagulation defects: Secondary | ICD-10-CM | POA: Diagnosis not present

## 2014-09-26 DIAGNOSIS — I82409 Acute embolism and thrombosis of unspecified deep veins of unspecified lower extremity: Secondary | ICD-10-CM

## 2014-09-26 DIAGNOSIS — Z7901 Long term (current) use of anticoagulants: Secondary | ICD-10-CM

## 2014-09-26 LAB — POCT INR: INR: 4.3

## 2014-10-03 ENCOUNTER — Encounter: Payer: Self-pay | Admitting: Podiatry

## 2014-10-03 ENCOUNTER — Ambulatory Visit (INDEPENDENT_AMBULATORY_CARE_PROVIDER_SITE_OTHER): Payer: Medicare Other | Admitting: Podiatry

## 2014-10-03 VITALS — BP 151/98 | HR 104

## 2014-10-03 DIAGNOSIS — M2011 Hallux valgus (acquired), right foot: Secondary | ICD-10-CM

## 2014-10-03 NOTE — Patient Instructions (Signed)
Pre-Operative Instructions  Congratulations, you have decided to take an important step to improving your quality of life.  You can be assured that the doctors of [facility] will be with you every step of the way.  1. Plan to be at the surgery center/hospital at least 1 (one) hour prior to your scheduled time unless otherwise directed by the surgical center/hospital staff.  You must have a responsible adult accompany you, remain during the surgery and drive you home.  Make sure you have directions to the surgical center/hospital and know how to get there on time. 2. For hospital based surgery you will need to obtain a history and physical form from your family physician within 1 month prior to the date of surgery- we will give you a form for you primary physician.  3. We make every effort to accommodate the date you request for surgery.  There are however, times where surgery dates or times have to be moved.  We will contact you as soon as possible if a change in schedule is required.   4. No Aspirin/Ibuprofen for one week before surgery.  If you are on aspirin, any non-steroidal anti-inflammatory medications (Mobic, Aleve, Ibuprofen) you should stop taking it 7 days prior to your surgery.  You make take Tylenol  For pain prior to surgery.  5. Medications- If you are taking daily heart and blood pressure medications, seizure, reflux, allergy, asthma, anxiety, pain or diabetes medications, make sure the surgery center/hospital is aware before the day of surgery so they may notify you which medications to take or avoid the day of surgery. 6. No food or drink after midnight the night before surgery unless directed otherwise by surgical center/hospital staff. 7. No alcoholic beverages 24 hours prior to surgery.  No smoking 24 hours prior to or 24 hours after surgery. 8. Wear loose pants or shorts- loose enough to fit over bandages, boots, and casts. 9. No slip on shoes, sneakers are best. 10. Bring  your boot with you to the surgery center/hospital.  Also bring crutches or a walker if your physician has prescribed it for you.  If you do not have this equipment, it will be provided for you after surgery. 11. If you have not been contracted by the surgery center/hospital by the day before your surgery, call to confirm the date and time of your surgery. 12. Leave-time from work may vary depending on the type of surgery you have.  Appropriate arrangements should be made prior to surgery with your employer. 13. Prescriptions will be provided immediately following surgery by your doctor.  Have these filled as soon as possible after surgery and take the medication as directed. 14. Remove nail polish on the operative foot. 15. Wash the night before surgery.  The night before surgery wash the foot and leg well with the antibacterial soap provided and water paying special attention to beneath the toenails and in between the toes.  Rinse thoroughly with water and dry well with a towel.  Perform this wash unless told not to do so by your physician.  Enclosed: 1 Ice pack (please put in freezer the night before surgery)   1 Hibiclens skin cleaner   Pre-op Instructions  If you have any questions regarding the instructions, do not hesitate to call our office.  [place]: [address] [place], [place] [address] [phone]  [place]: [address] [place], [place] [address] [phone]  [place]: [address]  [place], [place] [address] [phone]  Dr. Richard   Tuchman DPM, Dr. Norman Regal DPM Dr. Richard Sikora DPM, Dr. M. Todd Hyatt DPM, Dr. Kathryn Egerton DPM, Dr. Matthew Wagoner DPM 

## 2014-10-06 NOTE — Progress Notes (Signed)
Patient ID: Robin Harrell, female   DOB: Dec 14, 1933, 81 y.o.   MRN: [id number]  Subjective: 81 year old female presents to the office for surgical consultation of right foot bunion and hammertoe. She states that she continues have pain over her bunion on her right foot however hammertoes not symptomatic at this time. She states that she's had pain over the bunion for possible he 2 years and has been worsening. Most the pain is with shoe gear and ambulation on a daily basis. She states that she has attempted conservative treatment including shoe gear modifications, padding, offloading without any resolution. At this time she is requesting surgical intervention to help decrease her pain and deformity.  Objective: AAO x3, NAD DP/PT pulses palpable bilaterally, CRT less than 3 seconds Protective sensation intact with Simms Weinstein monofilament, vibratory sensation intact, Achilles tendon reflex intact There is moderate structural HAV deformity present to the right lower extremity with tenderness palpation along the medial aspect of the first metatarsal head. There is slight irritation over this area from shoe gear. There is no pain or crepitation first MTPJ range of motion. There are hammertoe deformities of lesser digits however they're not symptomatic at this time. MMT 5/5, ROM WNL.  No open lesions or pre-ulcerative lesions.  No overlying edema, erythema, increase in warmth to bilateral lower extremities.  No pain with calf compression, swelling, warmth, erythema bilaterally.   Assessment: 81 year old female with symptomatic HAV, right foot  Plan: -Previous x-rays were discussed with the patient. -Treatment options discussed including all alternatives, risks, and complications -I discussed both conservative and surgical options. -At this time as she is attended multiple conservative treatments without any resolution of symptoms she is requesting surgical intervention to help decrease her  pain and deformity. I discussed with her right foot Austin bunionectomy -The incision placement as well as the postoperative course was discussed with the patient. I discussed risks of the surgery which include, but not limited to, infection, bleeding, pain, swelling, need for further surgery, delayed or nonhealing, painful or ugly scar, numbness or sensation changes, over/under correction, recurrence, transfer lesions, further deformity, hardware failure, DVT/PE, loss of toe/foot. Patient understands these risks and wishes to proceed with surgery. The surgical consent was reviewed with the patient all 3 pages were signed. No promises or guarantees were given to the outcome of the procedure. All questions were answered to the best of my ability. Before the surgery the patient was encouraged to call the office if there is any further questions. The surgery will be performed at the [facility] on an outpatient basis. -Will need to follow-up with Dr. Naida Sleight office for lovenox bridging.   Celesta Gentile, DPM  -Prior to surgery patient will need to have medical clearance from both Dr. Gwenlyn Found and her PCP. She would need to be off coumadin prior to surgery. Doing so puts her at high risk of complications. If cleared for surgery, will discuss in more detail.

## 2014-10-10 ENCOUNTER — Ambulatory Visit (INDEPENDENT_AMBULATORY_CARE_PROVIDER_SITE_OTHER): Payer: Medicare Other | Admitting: Pharmacist Clinician (PhC)/ Clinical Pharmacy Specialist

## 2014-10-10 DIAGNOSIS — D688 Other specified coagulation defects: Secondary | ICD-10-CM | POA: Diagnosis not present

## 2014-10-10 DIAGNOSIS — Z7901 Long term (current) use of anticoagulants: Secondary | ICD-10-CM

## 2014-10-10 DIAGNOSIS — I82409 Acute embolism and thrombosis of unspecified deep veins of unspecified lower extremity: Secondary | ICD-10-CM | POA: Diagnosis not present

## 2014-10-10 DIAGNOSIS — D6851 Activated protein C resistance: Secondary | ICD-10-CM

## 2014-10-10 LAB — POCT INR: INR: 2.6

## 2014-10-10 MED ORDER — ENOXAPARIN SODIUM 60 MG/0.6ML ~~LOC~~ SOLN: 60.0000 mg | Freq: Two times a day (BID) | SUBCUTANEOUS | Status: DC

## 2014-10-10 NOTE — Patient Instructions (Addendum)
Enoxaprin Dosing Schedule  Enoxparin dose: 60 mg   Date  Warfarin Dose (evenings) Enoxaprin Dose  9-1 6 7.5 mg (1 tab)   9-2 5 0   9-3 4 0 8 am     8 pm  9-4 3 0 8 am     8 pm  9-5 2 0 8 am     8 pm  9-6 1 0 8am  9-7 Procedure 3.75 mg (1/2 tab)   9-8 1 7.5 mg (1 tab) 8 am     8 pm  9-9 2 7.5 mg (1 tab) 8 am     8 pm  9-10 3 7.5 mg (1 tab) 8 am     8 pm  9-11 4 7.5 mg (1 tab) 8 am     8 pm  9-12 5 Repeat INR 8 am     8 pm  9-13 6

## 2014-10-25 ENCOUNTER — Other Ambulatory Visit: Payer: Self-pay | Admitting: Emergency Medicine

## 2014-10-28 ENCOUNTER — Telehealth: Payer: Self-pay | Admitting: *Deleted

## 2014-10-28 ENCOUNTER — Ambulatory Visit (INDEPENDENT_AMBULATORY_CARE_PROVIDER_SITE_OTHER): Payer: Medicare Other

## 2014-10-28 DIAGNOSIS — D688 Other specified coagulation defects: Secondary | ICD-10-CM

## 2014-10-28 DIAGNOSIS — I82409 Acute embolism and thrombosis of unspecified deep veins of unspecified lower extremity: Secondary | ICD-10-CM | POA: Diagnosis not present

## 2014-10-28 DIAGNOSIS — D6851 Activated protein C resistance: Secondary | ICD-10-CM

## 2014-10-28 DIAGNOSIS — Z7901 Long term (current) use of anticoagulants: Secondary | ICD-10-CM

## 2014-10-28 DIAGNOSIS — Z01818 Encounter for other preprocedural examination: Secondary | ICD-10-CM

## 2014-10-28 LAB — POCT INR: INR: 1.1

## 2014-10-28 NOTE — Telephone Encounter (Signed)
"  I'm calling to see if my surgery is still on with Dr. Jacqualyn Posey.  Please call me back, thank you."  I'm calling from Dr. Leigh Aurora office.  Yes, your surgery is still on.  "Okay, thank you."

## 2014-10-28 NOTE — Telephone Encounter (Signed)
Thank you! I just wanted to see what it was before tomorrow to plan.

## 2014-10-28 NOTE — Telephone Encounter (Signed)
Patient INR 1.1 today at check - entered in EPIC. Pharmacist reviewed, OK for surgery per protocol. I have discussed with patient bridging protocol - patient voices understanding of instructions on resuming warfarin post-procedure. Left VM for Delydia to call our office if questions.

## 2014-10-28 NOTE — Telephone Encounter (Signed)
"  We have a mutual patient.  I see in EPIC where you wanted to make sure she had her INR checked.  I wanted to report that to you.  She did get it checked today.  She was 1.1, she's below range so she's within, below therapeutic range.  So she's susceptible to go for surgery.  "I'm going to enter these results in EPIC as soon as the pharmacist reviews it.  She's okay to precede with surgery as planned.  If you have questions, please give [place] a call.  I'll be available through the end of business today.  If you need to speak to someone tomorrow ask for Minette Headland, same phone number.  I'm not aware of her extension.

## 2014-10-28 NOTE — Telephone Encounter (Signed)
Good morning, I'm calling per Dr Jacqualyn Posey.  He wanted me to call and see if you had stopped the Coumadin.  "Yes, I have stopped it."  Also, Dr. Jacqualyn Posey wants you to have your INR level checked.  "I"m not supposed to do that until 11/03/14.  I'm not sure she's in today.  I'll call and check.  Will this prevent me from having the surgery?"  It may.  "Okay, let me see what I can do."

## 2014-10-28 NOTE — Telephone Encounter (Signed)
"  I was supposed to call you and  let you know if I can get an INR today.  I can at 1:30pm.  I'll call you back as soon as I get the results.  Thank you."

## 2014-10-29 DIAGNOSIS — M2021 Hallux rigidus, right foot: Secondary | ICD-10-CM

## 2014-10-30 ENCOUNTER — Telehealth: Payer: Self-pay | Admitting: *Deleted

## 2014-10-30 NOTE — Telephone Encounter (Addendum)
Pt states she had surgery with Dr. Jacqualyn Posey yesterday and they told her she did need to sleep in the boot, so they took it off and can't get it back on.  I told pt to come in today, before 1200pm, due to she should not weight bear without the boot.  I also told pt to call appt line #[phone] and have the scheduler get me to come get her with a wheelchair.  I refitted pt's Air Fracture Walker, and instructed pt to remain in the boot until instructed otherwise by Dr. Jacqualyn Posey.  Pt states the boot feels better and will keep it on.

## 2014-11-03 ENCOUNTER — Ambulatory Visit (INDEPENDENT_AMBULATORY_CARE_PROVIDER_SITE_OTHER): Payer: Medicare Other | Admitting: Podiatry

## 2014-11-03 ENCOUNTER — Ambulatory Visit (INDEPENDENT_AMBULATORY_CARE_PROVIDER_SITE_OTHER): Payer: Medicare Other

## 2014-11-03 ENCOUNTER — Encounter: Payer: Self-pay | Admitting: Podiatry

## 2014-11-03 ENCOUNTER — Ambulatory Visit (INDEPENDENT_AMBULATORY_CARE_PROVIDER_SITE_OTHER): Payer: Medicare Other | Admitting: Pharmacist Clinician (PhC)/ Clinical Pharmacy Specialist

## 2014-11-03 ENCOUNTER — Ambulatory Visit: Payer: Medicare Other | Admitting: Pharmacist Clinician (PhC)/ Clinical Pharmacy Specialist

## 2014-11-03 VITALS — BP 128/77 | HR 91 | Resp 18

## 2014-11-03 DIAGNOSIS — Z7901 Long term (current) use of anticoagulants: Secondary | ICD-10-CM

## 2014-11-03 DIAGNOSIS — D688 Other specified coagulation defects: Secondary | ICD-10-CM

## 2014-11-03 DIAGNOSIS — Z9889 Other specified postprocedural states: Secondary | ICD-10-CM

## 2014-11-03 DIAGNOSIS — D6851 Activated protein C resistance: Secondary | ICD-10-CM

## 2014-11-03 DIAGNOSIS — I82409 Acute embolism and thrombosis of unspecified deep veins of unspecified lower extremity: Secondary | ICD-10-CM

## 2014-11-03 DIAGNOSIS — M2011 Hallux valgus (acquired), right foot: Secondary | ICD-10-CM

## 2014-11-03 LAB — POCT INR: INR: 2.8

## 2014-11-03 NOTE — Progress Notes (Signed)
Patient ID: Robin Harrell, female   DOB: 17-Nov-1933, 81 y.o.   MRN: [id number]  DOS: 10/29/14 s/p Right Austin/Akin bunionectomy  Subjective: 81 year old female presents the office today one-week status post right foot bunion surgery. She states that she's had some discomfort to the area although it is improving. She's remain in the cam boot. She is also continue on antibiotics as directed. She denies any systemic complaints as fevers, chills, nausea, vomiting. Denies any calf pain, chest pain, shortness of breath. No other complaints at this time in no acute changes otherwise. She is finishing her Lovenox and Coumadin bridging.   Objective: AAO 3, NAD; dressings clean, dry, intact. Presents today in a CAM boot. DP/PT pulses palpable, CRT less than 3 seconds Protective sensation appears to be intact with Derrel Nip monofilament Incision is well coapted without any evidence of dehiscence and sutures are intact. There is moderate edema around the surgical site. There is a hemorrhagic bulla on the first interspace dorsally and to a lesser degree on the lateral aspect of the hallux. Upon draining there is clear/hemorrhagic fluid. No purulence. There is faint erythema around the incision from inflammation as opposed to infection. There is no increase in warmth or ascending cellulitis. There is no drainage or purulence from the incision. Mild to palpation along the surgical site. The hallux does sit any valgus position although the bunion is corrected. No other areas of tenderness. No other open lesions or pre-ulcerative lesions. No pain with calf compression, swelling, warmth, erythema.  Assessment: 81 year old female 1 week status post right bunion surgery.  Plan: -X-rays were obtained and reviewed with the patient.  -Treatment options discussed including all alternatives, risks, and complications -Bulla were drained. Antibiotic willhave an incision followed by dry sterile dressing. Keep the  dressing clean, dry, intact. -Pain medication as needed -Ice and elevation. -I discussed with her the valgus position of the toe. She states that she's happy with the position of the toe and that the bump is gone. -Continue to monitor for signs or symptoms of DVT/PE and directed to go to the emergency room should any occur. -Monitor for any clinical signs or symptoms of infection and directed to call the office immediately should any occur or go to the ER. -Follow-up in 1 week for likely suture removal or sooner if any problems arise. In the meantime, encouraged to call the office with any questions, concerns, change in symptoms.   Robin Harrell, DPM

## 2014-11-05 ENCOUNTER — Encounter: Payer: Self-pay | Admitting: Cardiovascular Disease

## 2014-11-11 ENCOUNTER — Ambulatory Visit: Payer: Medicare Other | Admitting: Cardiovascular Disease

## 2014-11-11 ENCOUNTER — Ambulatory Visit (INDEPENDENT_AMBULATORY_CARE_PROVIDER_SITE_OTHER): Payer: Medicare Other | Admitting: Cardiovascular Disease

## 2014-11-11 ENCOUNTER — Encounter: Payer: Self-pay | Admitting: Cardiovascular Disease

## 2014-11-11 VITALS — BP 118/68 | HR 72 | Ht 63.5 in | Wt 132.0 lb

## 2014-11-11 DIAGNOSIS — E785 Hyperlipidemia, unspecified: Secondary | ICD-10-CM | POA: Diagnosis not present

## 2014-11-11 DIAGNOSIS — Z8709 Personal history of other diseases of the respiratory system: Secondary | ICD-10-CM | POA: Insufficient documentation

## 2014-11-11 NOTE — Patient Instructions (Signed)
Medication Instructions:  Your physician recommends that you continue on your current medications as directed. Please refer to the Current Medication list given to you today.   Labwork: NONE  Testing/Procedures: NONE  Follow-Up: Your physician wants you to follow-up in: 12 months with Dr. Gwenlyn Found. You will receive a reminder letter in the mail two months in advance. If you don't receive a letter, please call our office to schedule the follow-up appointment.   Any Other Special Instructions Will Be Listed Below (If Applicable).

## 2014-11-11 NOTE — Assessment & Plan Note (Signed)
History of hypertension with blood pressure measures 118/68. She is on Cozaar and metoprolol. Continue current meds at current dosing

## 2014-11-11 NOTE — Assessment & Plan Note (Signed)
History of hyperlipidemia on atorvastatin followed by her PCP 

## 2014-11-11 NOTE — Progress Notes (Signed)
11/11/2014 Robin Harrell [facility]   27-Jun-1933  [id number]  Primary Physician [name], MD Primary Cardiologist: Lorretta Harp MD [name]   HPI:  The patient is a 81 year old, thin-appearing, married Caucasian female, mother of 87, grandmother to 4 grandchildren who I last saw in the office on 11/19/13... She has a history of treated hypertension, hyperlipidemia, and GERD. She does have a history of pulmonary embolism in the past with factor V Leiden deficiency on life-long Coumadin anticoagulation. She has had a normal 2D echo and Myoview back in 2009. She was recently admitted to [facility] for 2 weeks with acute respiratory insufficiency requiring intubation related to pneumonia and RSV. There was a thought that she may have had congestive heart failure as well, though a 2D echo was normal. She did have a Holter monitor that showed a large number of bigeminal PVCs which she is asymptomatic from and currently is on low-dose beta blocker. She currently denies chest pain or shortness of breath.   Current Outpatient Prescriptions  Medication Sig Dispense Refill  . atorvastatin (LIPITOR) 10 MG tablet Take 10 mg by mouth daily at 6 PM.    . brimonidine (ALPHAGAN P) 0.1 % SOLN Place 1 drop into both eyes every 12 (twelve) hours.      . Calcium Carb-Cholecalciferol (CALCIUM + D3 PO) Take 2 tablets by mouth 2 (two) times daily.    . Calcium Carb-Cholecalciferol (CALCIUM + D3) 600-200 MG-UNIT TABS Take 1 tablet by mouth.    . cetirizine (ZYRTEC) 10 MG tablet TAKE 1 TABLET BY MOUTH EVERY DAY 30 tablet 5  . DYMISTA 137-50 MCG/ACT SUSP SHAKE WELL AND USE 2 SPRAYS IN EACH NOSTRIL EVERY 12 HOURS AS NEEDED 23 g 5  . enoxaparin (LOVENOX) 60 MG/0.6ML injection Inject 0.6 mLs (60 mg total) into the skin every 12 (twelve) hours. 18 Syringe 0  . furosemide (LASIX) 20 MG tablet Take 20 mg by mouth.    Marland Kitchen HYDROcodone-acetaminophen (NORCO/VICODIN) 5-325 MG per tablet   0  .  latanoprost (XALATAN) 0.005 % ophthalmic solution 1 drop in each eye daily    . levothyroxine (SYNTHROID, LEVOTHROID) 25 MCG tablet Take 25 mcg by mouth daily.    Marland Kitchen loratadine (CLARITIN) 10 MG tablet Take 10 mg by mouth daily.    Marland Kitchen losartan (COZAAR) 50 MG tablet TAKE 1 TABLET BY MOUTH ONCE DAILY    . metoprolol tartrate (LOPRESSOR) 25 MG tablet Take 1 tablet (25 mg total) by mouth daily. 90 tablet 3  . multivitamin-iron-minerals-folic acid (CENTRUM) chewable tablet Chew 1 tablet by mouth daily.    Marland Kitchen omeprazole (PRILOSEC) 20 MG capsule Take 1 capsule (20 mg total) by mouth 2 (two) times daily before a meal. 60 capsule 5  . traZODone (DESYREL) 50 MG tablet Take 100 mg by mouth.    . warfarin (COUMADIN) 7.5 MG tablet TAKE 1 TABLET BY MOUTH EVERY DAY OR AS DIRECTED 90 tablet 1  . warfarin (COUMADIN) 7.5 MG tablet Take 7.5 mg by mouth.     No current facility-administered medications for this visit.    Allergies  Allergen Reactions  . Codeine Nausea Only  . Erythromycin Nausea And Vomiting    Social History   Social History  . Marital Status: Married    Spouse Name: N/A  . Number of Children: 4  . Years of Education: 13.5   Occupational History  . Network engineer - retired    Social History Main Topics  . Smoking status: Former  Smoker -- 1 years    Types: Cigarettes    Quit date: 02/21/1958  . Smokeless tobacco: Never Used  . Alcohol Use: No  . Drug Use: No  . Sexual Activity: Not on file   Other Topics Concern  . Not on file   Social History Narrative     Review of Systems: General: negative for chills, fever, night sweats or weight changes.  Cardiovascular: negative for chest pain, dyspnea on exertion, edema, orthopnea, palpitations, paroxysmal nocturnal dyspnea or shortness of breath Dermatological: negative for rash Respiratory: negative for cough or wheezing Urologic: negative for hematuria Abdominal: negative for nausea, vomiting, diarrhea, bright red blood per rectum,  melena, or hematemesis Neurologic: negative for visual changes, syncope, or dizziness All other systems reviewed and are otherwise negative except as noted above.    Blood pressure 118/68, pulse 72, height 5' 3.5" (1.613 m), weight 132 lb (59.875 kg).  General appearance: alert and no distress Neck: no adenopathy, no carotid bruit, no JVD, supple, symmetrical, trachea midline and thyroid not enlarged, symmetric, no tenderness/mass/nodules Lungs: clear to auscultation bilaterally Heart: regular rate and rhythm, S1, S2 normal, no murmur, click, rub or gallop Extremities: extremities normal, atraumatic, no cyanosis or edema Pulses: 2+ and symmetric Skin: Skin color, texture, turgor normal. No rashes or lesions Neurologic: Grossly normal  EKG normal sinus rhythm of 72 without ST or T-wave changes. She does have septal Q waves. Personally reviewed this EKG  ASSESSMENT AND PLAN:   HYPERTENSION History of hypertension with blood pressure measures 118/68. She is on Cozaar and metoprolol. Continue current meds at current dosing  Hyperlipidemia History of hyperlipidemia on atorvastatin followed by her PCP      Lorretta Harp MD [facility], [facility] 11/11/2014 11:25 AM

## 2014-11-14 ENCOUNTER — Encounter: Payer: Self-pay | Admitting: Podiatry

## 2014-11-14 ENCOUNTER — Ambulatory Visit (INDEPENDENT_AMBULATORY_CARE_PROVIDER_SITE_OTHER): Payer: Medicare Other | Admitting: Podiatry

## 2014-11-14 VITALS — BP 142/88 | HR 78 | Resp 18

## 2014-11-14 DIAGNOSIS — Z9889 Other specified postprocedural states: Secondary | ICD-10-CM

## 2014-11-14 DIAGNOSIS — M2011 Hallux valgus (acquired), right foot: Secondary | ICD-10-CM

## 2014-11-17 ENCOUNTER — Other Ambulatory Visit: Payer: Self-pay | Admitting: Emergency Medicine

## 2014-11-19 ENCOUNTER — Ambulatory Visit (INDEPENDENT_AMBULATORY_CARE_PROVIDER_SITE_OTHER): Payer: Medicare Other | Admitting: Pharmacist Clinician (PhC)/ Clinical Pharmacy Specialist

## 2014-11-19 DIAGNOSIS — I82409 Acute embolism and thrombosis of unspecified deep veins of unspecified lower extremity: Secondary | ICD-10-CM | POA: Diagnosis not present

## 2014-11-19 DIAGNOSIS — D688 Other specified coagulation defects: Secondary | ICD-10-CM | POA: Diagnosis not present

## 2014-11-19 DIAGNOSIS — Z7901 Long term (current) use of anticoagulants: Secondary | ICD-10-CM | POA: Diagnosis not present

## 2014-11-19 DIAGNOSIS — D6851 Activated protein C resistance: Secondary | ICD-10-CM

## 2014-11-19 LAB — POCT INR: INR: 3.5

## 2014-11-21 NOTE — Progress Notes (Signed)
Patient ID: Robin Harrell, female   DOB: December 13, 1933, 81 y.o.   MRN: [id number]  DOS: 10/29/14 s/p Right Austin/Akin bunionectomy  Subjective: 81 year old female presents the office today 2-weeks status post right foot bunion surgery. She presents today for suture removal. She states the pain continues to improve. She has been wearing the cam boot since last appointment and has not taken it off. She denies any systemic complaints as fevers, chills, nausea, vomiting. Denies any calf pain, chest pain, shortness of breath. No other complaints at this time in no acute changes otherwise.   Objective: AAO 3, NAD; dressings clean, dry, intact. Presents today in a CAM boot. DP/PT pulses palpable, CRT less than 3 seconds Protective sensation appears to be intact with Derrel Nip monofilament Incision is well coapted without any evidence of dehiscence and sutures are intact. There is decreased edema around the surgical site. There is evidence of dried hemorrhagic bulla on the first interspace dorsally and to a lesser degree on the lateral aspect of the hallux. There is no swelling erythema or drainage. There is mild to palpation along the surgical site. There is no swelling erythema, drainage/purulence. Edema appears to be decreased. No pain with MPJ range of motion. The toe does sit in slight valgus. No other areas of tenderness. No other open lesions or pre-ulcerative lesions. No pain with calf compression, swelling, warmth, erythema.  Assessment: 81 year old female 2 weeks status post right bunion surgery.  Plan:  -Treatment options discussed including all alternatives, risks, and complications -Suture ends were cut. Antibiotic will have an incision followed by dry sterile dressing. Keep the dressing clean, dry, intact. She can remove the dressing tomorrow and start to shower as long as incision remains coapted. If there is any problem incision to hold off on showering call the office and  medially. -Pain medication as needed -Ice and elevation. -Continue a cam boot at all times. Weight-bear as tolerated. -Continue to monitor for signs or symptoms of DVT/PE and directed to go to the emergency room should any occur. -Monitor for any clinical signs or symptoms of infection and directed to call the office immediately should any occur or go to the ER. -Follow-up in 2 weeks or sooner if any problems arise. In the meantime, encouraged to call the office with any questions, concerns, change in symptoms.   Celesta Gentile, DPM

## 2014-11-29 ENCOUNTER — Other Ambulatory Visit: Payer: Self-pay | Admitting: Cardiovascular Disease

## 2014-12-01 ENCOUNTER — Encounter: Payer: Self-pay | Admitting: Podiatry

## 2014-12-01 ENCOUNTER — Ambulatory Visit (INDEPENDENT_AMBULATORY_CARE_PROVIDER_SITE_OTHER): Payer: Medicare Other

## 2014-12-01 ENCOUNTER — Ambulatory Visit (INDEPENDENT_AMBULATORY_CARE_PROVIDER_SITE_OTHER): Payer: Medicare Other | Admitting: Podiatry

## 2014-12-01 DIAGNOSIS — M21969 Unspecified acquired deformity of unspecified lower leg: Secondary | ICD-10-CM | POA: Diagnosis not present

## 2014-12-01 DIAGNOSIS — M2142 Flat foot [pes planus] (acquired), left foot: Secondary | ICD-10-CM | POA: Diagnosis not present

## 2014-12-01 DIAGNOSIS — Z9889 Other specified postprocedural states: Secondary | ICD-10-CM

## 2014-12-01 DIAGNOSIS — M2011 Hallux valgus (acquired), right foot: Secondary | ICD-10-CM | POA: Diagnosis not present

## 2014-12-01 DIAGNOSIS — M2141 Flat foot [pes planus] (acquired), right foot: Secondary | ICD-10-CM | POA: Diagnosis not present

## 2014-12-06 NOTE — Progress Notes (Signed)
Patient ID: Robin Harrell, female   DOB: 09/25/1933, 81 y.o.   MRN: [id number]  DOS: 10/29/14 s/p Right Austin/Akin bunionectomy  Subjective: 81 year old female presents the office today 4-weeks status post right foot bunion surgery.  She states that she is doing well and her pain is improved. She states that she was happy that she was able to shower. She continues with a cam boot. She denies any systemic complaints as fevers, chills, nausea, vomiting. Denies any calf pain, chest pain, shortness of breath. No other complaints at this time in no acute changes otherwise.  She is also inquiring about new orthotics at today's appointment.  Objective: AAO 3, NAD; dressings clean, dry, intact. Presents today in a CAM boot. DP/PT pulses palpable 2/4, CRT less than 3 seconds Protective sensation appears to be intact with Robin Harrell monofilament Incision is well coapted without any evidence of dehiscence and a scar has formed. There is mild edema around the surgical site. There is evidence of dried hemorrhagic bulla on the first interspace dorsally and to a lesser degree on the lateral aspect of the hallux  Which is turned into hyperkeratotic tissue. The area was debrided lightly and the underlying skin was intact.. There is no  surroundingerythema or drainage. There is mild to palpation along the surgical site although this has significantly improved is well. There is no  Surrounding erythema, drainage/purulence.The toe does sit in slight valgus however upon weightbearing the toe does sit in the somewhat more rectus position.  No other areas of tenderness. No other open lesions or pre-ulcerative lesions.  There is a decrease in medial arch height bilaterally. No pain with calf compression, swelling, warmth, erythema. Upon evaluation of her old orthotics  They appear to be worn out.  Assessment: 81 year old female 2 weeks status post right bunion surgery.  Plan:  -Treatment options discussed including  all alternatives, risks, and complications -Ace wrap was applied to help with swelling. -Pain medication as needed -Ice and elevation. -Continue a cam boot at all times. Weight-bear as tolerated. -Continue to monitor for signs or symptoms of DVT/PE and directed to go to the emergency room should any occur. - At today's appointment she was also scanned for new orthotics at her request. They were sent to [facility] labs. -Monitor for any clinical signs or symptoms of infection and directed to call the office immediately should any occur or go to the ER. -Follow-up in 2 weeks or sooner if any problems arise. In the meantime, encouraged to call the office with any questions, concerns, change in symptoms.  * x-ray next appointment. We'll hopefully transition to a regular shoe next appointment.  Celesta Gentile, DPM

## 2014-12-07 ENCOUNTER — Other Ambulatory Visit: Payer: Self-pay | Admitting: Cardiovascular Disease

## 2014-12-10 ENCOUNTER — Ambulatory Visit (INDEPENDENT_AMBULATORY_CARE_PROVIDER_SITE_OTHER): Payer: Medicare Other | Admitting: Pharmacist Clinician (PhC)/ Clinical Pharmacy Specialist

## 2014-12-10 DIAGNOSIS — Z7901 Long term (current) use of anticoagulants: Secondary | ICD-10-CM | POA: Diagnosis not present

## 2014-12-10 DIAGNOSIS — D6851 Activated protein C resistance: Secondary | ICD-10-CM

## 2014-12-10 DIAGNOSIS — I82409 Acute embolism and thrombosis of unspecified deep veins of unspecified lower extremity: Secondary | ICD-10-CM | POA: Diagnosis not present

## 2014-12-10 LAB — POCT INR: INR: 2.5

## 2014-12-15 ENCOUNTER — Encounter: Payer: Self-pay | Admitting: Podiatry

## 2014-12-15 ENCOUNTER — Ambulatory Visit (INDEPENDENT_AMBULATORY_CARE_PROVIDER_SITE_OTHER): Payer: Medicare Other

## 2014-12-15 ENCOUNTER — Ambulatory Visit (INDEPENDENT_AMBULATORY_CARE_PROVIDER_SITE_OTHER): Payer: Medicare Other | Admitting: Podiatry

## 2014-12-15 VITALS — BP 153/88 | HR 89 | Resp 18

## 2014-12-15 DIAGNOSIS — M2011 Hallux valgus (acquired), right foot: Secondary | ICD-10-CM | POA: Diagnosis not present

## 2014-12-15 DIAGNOSIS — M79676 Pain in unspecified toe(s): Secondary | ICD-10-CM

## 2014-12-15 DIAGNOSIS — Z9889 Other specified postprocedural states: Secondary | ICD-10-CM

## 2014-12-15 DIAGNOSIS — B351 Tinea unguium: Secondary | ICD-10-CM | POA: Diagnosis not present

## 2014-12-15 NOTE — Progress Notes (Signed)
Patient ID: Robin Harrell, female   DOB: 1933-08-14, 81 y.o.   MRN: [id number]  DOS: 10/29/14 s/p Right Austin/Akin bunionectomy  Subjective: 81 year old female presents the office today 6-weeks status post right foot bunion surgery. She states that the pain was somewhat worse after wearing the surgical shoe that was in a cam boot. She states that she was more uncomfortable. She wears this shoe at all times. She is not taking any pain medicine. She denies any systemic complaints as fevers, chills, nausea, vomiting. Denies any calf pain, chest pain, shortness of breath. No other complaints at this time in no acute changes otherwise.  She is also asking for nail debridement of his appointment as her nails are elongated and she is unable to trim them herself. Nails are causing pain particularly in shoes.  Objective: AAO 3, NAD; dressings clean, dry, intact. Presents today in a CAM boot. DP/PT pulses palpable 2/4, CRT less than 3 seconds Protective sensation appears to be intact with Derrel Nip monofilament Incision is well coapted without any evidence of dehiscence and a scar has formed. There is mild edema around the surgical site although appears to be significantly improved.There is no  Surrounding erythema or drainage. There is mild to palpation along the surgical site although continuing to improve. There is no surrounding erythema, drainage/purulence.The toe does sit in slight valgus however upon weightbearing the toe does sit in the somewhat more rectus position this appears to be improved compared to last appointment.  No other areas of tenderness. No other open lesions or pre-ulcerative lesions.  There is a decrease in medial arch height bilaterally. No pain with calf compression, swelling, warmth, erythema. Nails appear to be hypertrophic, dystrophic, brittle, discolored, elongated 10. There is tenderness in nails 1-5 bilaterally. No surrounding erythema or drainage on the toenails.  Hammertoe contracture left foot mostly on the second toe which is semi-reducible. No tenderness along the area.  Assessment: 81 year old female 6 weeks status post right bunion surgery; symptomatic onychomycosis.  Plan:  -Treatment options discussed including all alternatives, risks, and complications -Pain medication as needed -Ice and elevation. -She can wear a regular shoe as tolerated. Continue range of motion exercises for first MTPJ. -Continue to monitor for signs or symptoms of DVT/PE and directed to go to the emergency room should any occur. - Wearing orthotics -Nail sharply debrided 10 without complication/bleeding -Monitor for any clinical signs or symptoms of infection and directed to call the office immediately should any occur or go to the ER. -Follow-up in 4 weeks or sooner if any problems arise. In the meantime, encouraged to call the office with any questions, concerns, change in symptoms.  * x-ray next appointment. We'll hopefully transition to a regular shoe next appointment.  Celesta Gentile, DPM

## 2014-12-16 ENCOUNTER — Encounter: Payer: Self-pay | Admitting: Emergency Medicine

## 2014-12-16 ENCOUNTER — Ambulatory Visit (INDEPENDENT_AMBULATORY_CARE_PROVIDER_SITE_OTHER): Payer: Medicare Other | Admitting: Emergency Medicine

## 2014-12-16 VITALS — BP 124/76 | HR 99 | Ht 62.5 in | Wt 137.0 lb

## 2014-12-16 DIAGNOSIS — D6851 Activated protein C resistance: Secondary | ICD-10-CM

## 2014-12-16 DIAGNOSIS — J309 Allergic rhinitis, unspecified: Secondary | ICD-10-CM

## 2014-12-16 DIAGNOSIS — J3089 Other allergic rhinitis: Secondary | ICD-10-CM

## 2014-12-16 DIAGNOSIS — K219 Gastro-esophageal reflux disease without esophagitis: Secondary | ICD-10-CM | POA: Diagnosis not present

## 2014-12-16 DIAGNOSIS — R05 Cough: Secondary | ICD-10-CM

## 2014-12-16 DIAGNOSIS — J302 Other seasonal allergic rhinitis: Secondary | ICD-10-CM

## 2014-12-16 DIAGNOSIS — R059 Cough, unspecified: Secondary | ICD-10-CM

## 2014-12-16 NOTE — Assessment & Plan Note (Signed)
Please continue omeprazole (Prilosec) 20 mg twice a day.  Do not restart Dymista nasal spray this time Start using Atrovent nasal spray 0.03%, 2 sprays each nostril 2-3 times a day as needed for congestion and drainage Restart loratadine 10 mg daily Get your flu shot as planned.  Follow with Dr Lamonte Sakai in 6 months or sooner if you have any problems

## 2014-12-16 NOTE — Assessment & Plan Note (Signed)
Continue PPI ?

## 2014-12-16 NOTE — Progress Notes (Signed)
Subjective:    Patient ID: Robin Harrell, female    DOB: 1933-07-31, 81 y.o.   MRN: [id number]  Cough   81 yo female with COPD, Chronic rhinitis, chronic cough, and GERD, hiatal hernia. 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   ROV 07/16/13 -- COPD. Chronic cough, components of GERD and PND.  She is coughing . Stopped Nexium about 3 weeks ago because she doidn't fel any better. Barium swallow 06/04/13 without overt aspiration, did have freq throat clearing and cough - they queried extra esophageal cause. Dr Janace Hoard wanted her to do pH probe, hasn't been done yet.  She is on zyrtec, ran out of flonase spray last week. She is doing the [place] daily.   ROV 10/16/13 -- Hx of COPD. Chronic cough, components of GERD and PND.  She has been doing fairly well. Saw Dr Annamaria Boots and there was no specific allergen to treat with immunotherapy. She is no longer on fluticasone, has dymista available to use prn. She is on zyrtec, wonders about stopping it for a while. She is planning for cataract sgy next week. Denies SOB.   ROV 05/08/14 -- Follow up visit for chronic cough associated with allergic rhinitis, GERD. She carries a history of possible mild obstructive disease although she had reassuring pulmonary function testing in 2010.  She has been experiencing more nasal congestion, more cough over last few months. She went back on her dymista and cetirizine. Has been having more eye watering and sneezing. I went back and reviewed her primary function testing from 2010 today. This did not show any significant obstructive lung disease.  ROV 07/28/14 -- history of chronic cough that is associated with allergic rhinitis and GERD. At her last visit her cough was still very bothersome. I added chlorpheniramine to her existing nasal saline washes, dymista, cetirizine. Also discussed adding Benadryl . She is not on anything for GERD. She returns today with some slight improvement in her cough, but continued to have allergy  trouble.   ROV 12/16/14 -- follow-up visit for chronic cough in the setting of allergic rhinitis and GERD. She had normal spirometry performed in July  2010. She is a bit unclear on her medication list, unsure exactly what she is taking. Since last visit she has had foot surgery, is now doing better. She had to do enoxaparin while she was off coumadin. Her coughing has been quiet, although she continues to have problems with nasal drainage. She is not currently on loratadine, not on nasal sprays. She had been on dymista.    Review of Systems  Respiratory: Positive for cough.    As per HPI     Objective:   Physical Exam Filed Vitals:   12/16/14 1518 12/16/14 1519  BP:  124/76  Pulse:  99  Height: 5' 2.5" (1.588 m)   Weight: 137 lb (62.143 kg)   SpO2:  98%    Gen: Pleasant, well-nourished, in no distress,  normal affect  ENT: No lesions,  mouth clear,  oropharynx clear,   Neck: No JVD,   Lungs: No use of accessory muscles, clear without rales or rhonchi  Cardiovascular: RRR, heart sounds normal, no murmur or gallops, no peripheral edema  Musculoskeletal: No deformities, no cyanosis or clubbing  Neuro: alert, non focal  Skin: Warm, no lesions or rashes       Assessment & Plan:   COUGH, CHRONIC Please continue omeprazole (Prilosec) 20 mg twice a day.  Do not restart Dymista  nasal spray this time Start using Atrovent nasal spray 0.03%, 2 sprays each nostril 2-3 times a day as needed for congestion and drainage Restart loratadine 10 mg daily Get your flu shot as planned.  Follow with Dr Lamonte Sakai in 6 months or sooner if you have any problems  GERD Continue PPI  Seasonal and perennial allergic rhinitis She has not been using dymista. Her cough has been controlled but she does have nasal congestion and drainage. We will restart loratadine and try using Atrovent nasal spray as needed  Factor 5 Leiden mutation, heterozygous Continue Coumadin    Updated Medication  List Outpatient Encounter Prescriptions as of 12/16/2014  Medication Sig  . atorvastatin (LIPITOR) 10 MG tablet Take 10 mg by mouth daily at 6 PM.  . brimonidine (ALPHAGAN P) 0.1 % SOLN Place 1 drop into both eyes every 12 (twelve) hours.    . cetirizine (ZYRTEC) 10 MG tablet TAKE 1 TABLET BY MOUTH EVERY DAY  . latanoprost (XALATAN) 0.005 % ophthalmic solution Place 1 drop into both eyes at bedtime.   Marland Kitchen levothyroxine (SYNTHROID, LEVOTHROID) 25 MCG tablet Take 25 mcg by mouth daily before breakfast.   . losartan (COZAAR) 50 MG tablet TAKE 1 TABLET BY MOUTH ONCE DAILY  . metoprolol tartrate (LOPRESSOR) 25 MG tablet TAKE 1 TABLET BY MOUTH EVERY DAY  . multivitamin-iron-minerals-folic acid (CENTRUM) chewable tablet Chew 1 tablet by mouth daily.  Marland Kitchen omeprazole (PRILOSEC) 20 MG capsule Take 1 capsule (20 mg total) by mouth 2 (two) times daily before a meal.  . warfarin (COUMADIN) 7.5 MG tablet TAKE 1 TABLET BY MOUTH EVERY DAY OR AS DIRECTED  . [DISCONTINUED] loratadine (CLARITIN) 10 MG tablet Take 10 mg by mouth daily.  . [DISCONTINUED] Calcium Carb-Cholecalciferol (CALCIUM + D3 PO) Take 2 tablets by mouth 2 (two) times daily.  . [DISCONTINUED] Calcium Carb-Cholecalciferol (CALCIUM + D3) 600-200 MG-UNIT TABS Take 1 tablet by mouth daily.   . [DISCONTINUED] DYMISTA 137-50 MCG/ACT SUSP SHAKE WELL AND USE 2 SPRAYS IN EACH NOSTRIL EVERY 12 HOURS AS NEEDED  . [DISCONTINUED] warfarin (COUMADIN) 7.5 MG tablet Take 7.5 mg by mouth.  . [DISCONTINUED] warfarin (COUMADIN) 7.5 MG tablet TAKE 1 TABLET BY MOUTH EVERY DAY OR AS DIRECTED   No facility-administered encounter medications on file as of 12/16/2014.

## 2014-12-16 NOTE — Assessment & Plan Note (Signed)
Continue Coumadin. 

## 2014-12-16 NOTE — Patient Instructions (Signed)
Please continue omeprazole (Prilosec) 20 mg twice a day.  Do not restart Dymista nasal spray this time Start using Atrovent nasal spray 0.03%, 2 sprays each nostril 2-3 times a day as needed for congestion and drainage Restart loratadine 10 mg daily Get your flu shot as planned.  Follow with Dr Lamonte Sakai in 6 months or sooner if you have any problems

## 2014-12-16 NOTE — Assessment & Plan Note (Signed)
She has not been using dymista. Her cough has been controlled but she does have nasal congestion and drainage. We will restart loratadine and try using Atrovent nasal spray as needed

## 2014-12-17 ENCOUNTER — Telehealth: Payer: Self-pay | Admitting: Emergency Medicine

## 2014-12-17 MED ORDER — IPRATROPIUM BROMIDE 0.03 % NA SOLN: 2.0000 | Freq: Three times a day (TID) | NASAL | Status: DC | PRN

## 2014-12-17 NOTE — Telephone Encounter (Signed)
COUGH, CHRONIC Please continue omeprazole (Prilosec) 20 mg twice a day.  Do not restart Dymista nasal spray this time Start using Atrovent nasal spray 0.03%, 2 sprays each nostril 2-3 times a day as needed for congestion and drainage Restart loratadine 10 mg daily Get your flu shot as planned.  Follow with Dr Lamonte Sakai in 6 months or sooner if you have any problems  Medication sent electronically to pharmacy per instructions of RB Pt called and notified of medication  Nothing further is needed at this time.

## 2014-12-23 ENCOUNTER — Other Ambulatory Visit: Payer: Self-pay | Admitting: Emergency Medicine

## 2014-12-24 ENCOUNTER — Ambulatory Visit: Payer: Medicare Other | Admitting: Cardiovascular Disease

## 2015-01-07 ENCOUNTER — Ambulatory Visit (INDEPENDENT_AMBULATORY_CARE_PROVIDER_SITE_OTHER): Payer: Medicare Other | Admitting: Pharmacist Clinician (PhC)/ Clinical Pharmacy Specialist

## 2015-01-07 DIAGNOSIS — D6851 Activated protein C resistance: Secondary | ICD-10-CM | POA: Diagnosis not present

## 2015-01-07 DIAGNOSIS — Z7901 Long term (current) use of anticoagulants: Secondary | ICD-10-CM

## 2015-01-07 DIAGNOSIS — I82409 Acute embolism and thrombosis of unspecified deep veins of unspecified lower extremity: Secondary | ICD-10-CM

## 2015-01-07 LAB — POCT INR: INR: 3.6

## 2015-01-12 ENCOUNTER — Ambulatory Visit (INDEPENDENT_AMBULATORY_CARE_PROVIDER_SITE_OTHER): Payer: Medicare Other | Admitting: Podiatry

## 2015-01-12 ENCOUNTER — Encounter: Payer: Self-pay | Admitting: Podiatry

## 2015-01-12 ENCOUNTER — Ambulatory Visit (INDEPENDENT_AMBULATORY_CARE_PROVIDER_SITE_OTHER): Payer: Medicare Other

## 2015-01-12 VITALS — BP 169/91 | HR 84 | Resp 18

## 2015-01-12 DIAGNOSIS — M2010 Hallux valgus (acquired), unspecified foot: Secondary | ICD-10-CM | POA: Insufficient documentation

## 2015-01-12 DIAGNOSIS — M2011 Hallux valgus (acquired), right foot: Secondary | ICD-10-CM

## 2015-01-12 DIAGNOSIS — M25571 Pain in right ankle and joints of right foot: Secondary | ICD-10-CM | POA: Insufficient documentation

## 2015-01-12 DIAGNOSIS — Z9889 Other specified postprocedural states: Secondary | ICD-10-CM

## 2015-01-12 NOTE — Progress Notes (Signed)
Patient ID: Robin BENETT, female   DOB: [birth year], 81 y.o.   MRN: [id number]  Subjective: Robin Harrell is a 81 y.o. is seen today in office s/p right austin/akin bunionectomy preformed on 10/29/14. They state their pain is improving.  She was able to work the [employer] all day without any issues. She does get some occasional discomfort on the surgical site but is not on a daily basis is intermittent. She does that she's had some pain in her right ankle which is being on on prior to the surgery. She has pain in her ankle when she first gets up in the morning his been ongoing for several months prior I first saw her she states. She denies any history of injury or trauma. No swelling or redness. No tendon or numbness. No injury or trauma. Denies any systemic complaints such as fevers, chills, nausea, vomiting. No calf pain, chest pain, shortness of breath.   Objective: General: No acute distress, AAOx3  DP/PT pulses palpable 2/4, CRT < 3 sec to all digits.  Protective sensation intact. Motor function intact.  Right foot: Incision is well coapted without any evidence of dehiscence and a scar is well formed. There is no surrounding erythema, ascending cellulitis, fluctuance, crepitus, malodor, drainage/purulence. There is trace edema around the surgical site. There is no pain along the surgical site.  There is no pain or restriction first MTPJ range of motion.  Along the right ankle there is mild tarsal patient along the anterior aspect of the ankle joint. There is no specific area pinpoint bony tenderness there is no pain the vibratory sensation. There is no pain or restriction with ankle joint range of motion. No other areas of tenderness to bilateral lower extremities.  No other open lesions or pre-ulcerative lesions.  No pain with calf compression, swelling, warmth, erythema.   Assessment and Plan:  Status post  Right bunionectomy, doing well with no complications ;  Right  chronicankle  pain  -X-rays were obtained and reviewed with the patient.  -Treatment options discussed including all alternatives, risks, and complications -Ice/elevation -Pain medication as needed. -cocoa butter or vitamin E cream over the incisions -ROM exercises for 1st MTPJ -Does not want to take any medication for the ankle pain. Discussed range of motion exercises. If symptoms persist We'll consider more aggressive treatment. -Monitor for any clinical signs or symptoms of infection and DVT/PE and directed to call the office immediately should any occur or go to the ER. -Follow-up in the next several weeks or sooner if any problems arise. In the meantime, encouraged to call the office with any questions, concerns, change in symptoms.   Celesta Gentile, DPM

## 2015-01-14 ENCOUNTER — Other Ambulatory Visit: Payer: Self-pay | Admitting: Emergency Medicine

## 2015-01-19 ENCOUNTER — Ambulatory Visit (INDEPENDENT_AMBULATORY_CARE_PROVIDER_SITE_OTHER): Payer: Medicare Other | Admitting: Pharmacist Clinician (PhC)/ Clinical Pharmacy Specialist

## 2015-01-19 DIAGNOSIS — I82409 Acute embolism and thrombosis of unspecified deep veins of unspecified lower extremity: Secondary | ICD-10-CM | POA: Diagnosis not present

## 2015-01-19 DIAGNOSIS — Z7901 Long term (current) use of anticoagulants: Secondary | ICD-10-CM

## 2015-01-19 DIAGNOSIS — D6851 Activated protein C resistance: Secondary | ICD-10-CM

## 2015-01-19 LAB — POCT INR: INR: 1.8

## 2015-02-02 ENCOUNTER — Ambulatory Visit (INDEPENDENT_AMBULATORY_CARE_PROVIDER_SITE_OTHER): Payer: Medicare Other | Admitting: Pharmacist Clinician (PhC)/ Clinical Pharmacy Specialist

## 2015-02-02 DIAGNOSIS — Z7901 Long term (current) use of anticoagulants: Secondary | ICD-10-CM | POA: Diagnosis not present

## 2015-02-02 DIAGNOSIS — D6851 Activated protein C resistance: Secondary | ICD-10-CM

## 2015-02-02 DIAGNOSIS — I82409 Acute embolism and thrombosis of unspecified deep veins of unspecified lower extremity: Secondary | ICD-10-CM

## 2015-02-02 LAB — POCT INR: INR: 3.4

## 2015-02-20 ENCOUNTER — Ambulatory Visit (INDEPENDENT_AMBULATORY_CARE_PROVIDER_SITE_OTHER): Payer: Medicare Other | Admitting: Pharmacist Clinician (PhC)/ Clinical Pharmacy Specialist

## 2015-02-20 DIAGNOSIS — D6851 Activated protein C resistance: Secondary | ICD-10-CM | POA: Diagnosis not present

## 2015-02-20 DIAGNOSIS — Z7901 Long term (current) use of anticoagulants: Secondary | ICD-10-CM | POA: Diagnosis not present

## 2015-02-20 DIAGNOSIS — I82409 Acute embolism and thrombosis of unspecified deep veins of unspecified lower extremity: Secondary | ICD-10-CM | POA: Diagnosis not present

## 2015-02-20 LAB — POCT INR: INR: 3.7

## 2015-03-02 ENCOUNTER — Ambulatory Visit: Payer: Medicare Other | Admitting: Podiatry

## 2015-03-03 ENCOUNTER — Ambulatory Visit (INDEPENDENT_AMBULATORY_CARE_PROVIDER_SITE_OTHER): Payer: Medicare Other | Admitting: Pharmacist Clinician (PhC)/ Clinical Pharmacy Specialist

## 2015-03-03 DIAGNOSIS — D6851 Activated protein C resistance: Secondary | ICD-10-CM

## 2015-03-03 DIAGNOSIS — I82409 Acute embolism and thrombosis of unspecified deep veins of unspecified lower extremity: Secondary | ICD-10-CM | POA: Diagnosis not present

## 2015-03-03 DIAGNOSIS — Z7901 Long term (current) use of anticoagulants: Secondary | ICD-10-CM

## 2015-03-03 LAB — POCT INR: INR: 2.1

## 2015-03-05 ENCOUNTER — Ambulatory Visit (INDEPENDENT_AMBULATORY_CARE_PROVIDER_SITE_OTHER): Payer: Medicare Other | Admitting: Podiatry

## 2015-03-05 ENCOUNTER — Encounter: Payer: Self-pay | Admitting: Podiatry

## 2015-03-05 ENCOUNTER — Ambulatory Visit (INDEPENDENT_AMBULATORY_CARE_PROVIDER_SITE_OTHER): Payer: Medicare Other

## 2015-03-05 VITALS — BP 161/92 | HR 85 | Resp 18

## 2015-03-05 DIAGNOSIS — R531 Weakness: Secondary | ICD-10-CM | POA: Diagnosis not present

## 2015-03-05 DIAGNOSIS — M7741 Metatarsalgia, right foot: Secondary | ICD-10-CM

## 2015-03-05 DIAGNOSIS — M25571 Pain in right ankle and joints of right foot: Secondary | ICD-10-CM

## 2015-03-05 DIAGNOSIS — M2011 Hallux valgus (acquired), right foot: Secondary | ICD-10-CM | POA: Diagnosis not present

## 2015-03-06 ENCOUNTER — Telehealth: Payer: Self-pay | Admitting: *Deleted

## 2015-03-06 DIAGNOSIS — Z9889 Other specified postprocedural states: Secondary | ICD-10-CM

## 2015-03-06 DIAGNOSIS — M2011 Hallux valgus (acquired), right foot: Secondary | ICD-10-CM

## 2015-03-06 DIAGNOSIS — R531 Weakness: Secondary | ICD-10-CM

## 2015-03-06 NOTE — Telephone Encounter (Addendum)
Pt states she was referred to [facility], but wants to go to [facility] for PT, but they will not accept the [facility] form.  I told pt I didn't see a reference to PT, I would ask Dr. Jacqualyn Posey 03/09/2015 and call again.  Dr. Jacqualyn Posey ordered right side strength training after post op.  Faxed to [place] and called pt to inform.

## 2015-03-08 NOTE — Progress Notes (Signed)
Patient ID: Robin Harrell, female   DOB: 11-17-1933, 82 y.o   MRN: [id number]  Subjective: BENNA HASTING is a 82 y.o. is seen today in office s/p right aust[place] bunionectomy preformed on 10/29/14.  She states that she is not having pain to the bunion site although her toe has been leaning over some more in what it was. She states that she does get pain to the ball of her foot when she walks intermittently. This is been ongoing but does continue. No recent injury or trauma. No swelling or redness.No swelling or redness. No tinglingor numbness. No injury or trauma. Denies any systemic complaints such as fevers, chills, nausea, vomiting. No calf pain, chest pain, shortness of breath.   Objective: General: No acute distress, AAOx3  DP/PT pulses palpable 2/4, CRT < 3 sec to all digits.  Protective sensation intact. Motor function intact.  Right foot: Incision is well coapted without any evidence of dehiscence and a scar is well formed. There is no surrounding erythema, ascending cellulitis, fluctuance, crepitus, malodor, drainage/purulence. There is decreased and minimal edema around the surgical site. There is no pain along the surgical site.  There is no pain or restriction first MTPJ range of motion. The right hallux does sit in a slight abducted position but improved compared to pre-op.   there is prominence the metatarsal heads plantarly on the right foot with mild tenderness to palpation upon metatarsal heads 2 through 5 on the right side. There is noted tenderness in the dorsal aspect of the metatarsals. No pain with MPJ range of motion. She does complain of some subjective weakness to her right side since the surgery and was inquiring about physical therapy.  No other areas of tenderness to bilateral lower extremities.  No other open lesions or pre-ulcerative lesions.  No pain with calf compression, swelling, warmth, erythema.   Assessment and Plan:  Status post  Right bunionectomy,  doing well;  Right foot metatarsalgia, overall weakness with right leg  Since the surgery  -X-rays were obtained and reviewed with the patient. There is a questionable area on xray of the 3rd metatarsal although there is no tenderness in that area. Will repeat the x-ray in 2 weeks. No history of injury or trauma.  -Treatment options discussed including all alternatives, risks, and complications -Metatarsal pads were added to orthotic to help offload the metatarsal heads. -She would like to proceed with physical therapy to help increase her strength the right side. This was ordered today.  -ROM exercises for 1st MTPJ  Celesta Gentile, DPM

## 2015-03-08 NOTE — Telephone Encounter (Signed)
Yes, she is to do PT. I don't know if we have a [facility] PT form. Can we get one and I will complete it to fax to them.

## 2015-03-23 ENCOUNTER — Ambulatory Visit (INDEPENDENT_AMBULATORY_CARE_PROVIDER_SITE_OTHER): Payer: Medicare Other

## 2015-03-23 ENCOUNTER — Encounter: Payer: Self-pay | Admitting: Podiatry

## 2015-03-23 ENCOUNTER — Ambulatory Visit (INDEPENDENT_AMBULATORY_CARE_PROVIDER_SITE_OTHER): Payer: Medicare Other | Admitting: Podiatry

## 2015-03-23 VITALS — BP 147/92 | HR 92 | Resp 18

## 2015-03-23 DIAGNOSIS — M2011 Hallux valgus (acquired), right foot: Secondary | ICD-10-CM

## 2015-03-23 DIAGNOSIS — M7741 Metatarsalgia, right foot: Secondary | ICD-10-CM | POA: Diagnosis not present

## 2015-03-24 ENCOUNTER — Ambulatory Visit (INDEPENDENT_AMBULATORY_CARE_PROVIDER_SITE_OTHER): Payer: Medicare Other | Admitting: Pharmacist Clinician (PhC)/ Clinical Pharmacy Specialist

## 2015-03-24 ENCOUNTER — Other Ambulatory Visit: Payer: Self-pay | Admitting: Cardiovascular Disease

## 2015-03-24 DIAGNOSIS — Z7901 Long term (current) use of anticoagulants: Secondary | ICD-10-CM | POA: Diagnosis not present

## 2015-03-24 DIAGNOSIS — I82409 Acute embolism and thrombosis of unspecified deep veins of unspecified lower extremity: Secondary | ICD-10-CM | POA: Diagnosis not present

## 2015-03-24 DIAGNOSIS — D6851 Activated protein C resistance: Secondary | ICD-10-CM | POA: Diagnosis not present

## 2015-03-24 LAB — POCT INR: INR: 2.2

## 2015-03-24 NOTE — Telephone Encounter (Signed)
Rx request sent to pharmacy.  

## 2015-03-24 NOTE — Progress Notes (Signed)
Patient ID: CAETLYN AY, female   DOB: March 07, 1933, 82 y.o.   MRN: [id number]  Subjective: Robin Harrell is a 82 y.o. is seen today in office s/p right austin/akin bunionectomy preformed on 10/29/14.  She also presents today for follow-up evaluation of possible stress fracture to the right foot. She states that the plastic limits she has started physical therapy and the swelling to her foot is greatly improved that she is doing much better right foot. She also states that her pain has decreased. She does continue gets some pain to the ball of her foot however this is improving. She does that may actually  It may be worse after placement metatarsal pads which she continues to wear them. She states with metatarsal pads a hurt more at first she walks and does ease up. No injury or trauma. Denies any systemic complaints such as fevers, chills, nausea, vomiting. No calf pain, chest pain, shortness of breath.   Objective: General: No acute distress, AAOx3  DP/PT pulses palpable 2/4, CRT < 3 sec to all digits.  Protective sensation intact. Motor function intact.  Right foot: Incision is well coapted without any evidence of dehiscence and a scar is well formed. There is no surrounding erythema, ascending cellulitis, fluctuance, crepitus, malodor, drainage/purulence. There is greatly improved edema and there is currently no edema present on the right foot. There is no pain on the surgical site. There is no pain with first MTPJ range of motion. There is mild discomfort on the second metatarsal how there is no pain the vibratory sensation. There is no other area pinpoint bony tenderness or pain the vibratory sensation. Subjectively she feels that her strength has improved compared to  Last appointment. No other areas of tenderness to bilateral lower extremities.  No other open lesions or pre-ulcerative lesions.  No pain with calf compression, swelling, warmth, erythema.   Assessment and Plan:  Status  post  Right bunionectomy, doing well;  Resolving right foot pain.  -X-rays were obtained and reviewed with the patient.  Hardware intact. There is no definitive evidence of acute fracture or stress fracture identified at this time. -Treatment options discussed including all alternatives, risks, and complications - Removed the metatarsal pads as it seemed to increase her symptoms. -Finish PT -Follow-up after PT or sooner if any problems arise. In the meantime, encouraged to call the office with any questions, concerns, change in symptoms. She states she will call after PT.  Celesta Gentile, DPM

## 2015-03-26 ENCOUNTER — Telehealth: Payer: Self-pay | Admitting: Cardiovascular Disease

## 2015-03-26 MED ORDER — METOPROLOL TARTRATE 25 MG PO TABS: 25.0000 mg | ORAL_TABLET | Freq: Two times a day (BID) | ORAL | Status: DC

## 2015-03-26 NOTE — Telephone Encounter (Signed)
°*  STAT* If patient is at the pharmacy, call can be transferred to refill team.   1. Which medications need to be refilled? (please list name of each medication and dose if known)Metoprolol-pt says dosage have increased-need new prescription 2. Which pharmacy/location (including street and city if local pharmacy) is medication to be sent to?[phone]  3. Do they need a 30 day or 90 day supply? Waiting to see the amount doctor request

## 2015-03-26 NOTE — Telephone Encounter (Signed)
Refill sent.

## 2015-04-21 ENCOUNTER — Ambulatory Visit (INDEPENDENT_AMBULATORY_CARE_PROVIDER_SITE_OTHER): Payer: Medicare Other | Admitting: Pharmacist Clinician (PhC)/ Clinical Pharmacy Specialist

## 2015-04-21 DIAGNOSIS — D6851 Activated protein C resistance: Secondary | ICD-10-CM | POA: Diagnosis not present

## 2015-04-21 DIAGNOSIS — Z7901 Long term (current) use of anticoagulants: Secondary | ICD-10-CM | POA: Diagnosis not present

## 2015-04-21 DIAGNOSIS — I82409 Acute embolism and thrombosis of unspecified deep veins of unspecified lower extremity: Secondary | ICD-10-CM

## 2015-04-21 LAB — POCT INR: INR: 2.7

## 2015-05-29 ENCOUNTER — Other Ambulatory Visit: Payer: Self-pay | Admitting: Cardiovascular Disease

## 2015-06-01 ENCOUNTER — Ambulatory Visit (INDEPENDENT_AMBULATORY_CARE_PROVIDER_SITE_OTHER): Payer: Medicare Other | Admitting: Pharmacist Clinician (PhC)/ Clinical Pharmacy Specialist

## 2015-06-01 DIAGNOSIS — I82409 Acute embolism and thrombosis of unspecified deep veins of unspecified lower extremity: Secondary | ICD-10-CM | POA: Diagnosis not present

## 2015-06-01 DIAGNOSIS — D6851 Activated protein C resistance: Secondary | ICD-10-CM | POA: Diagnosis not present

## 2015-06-01 DIAGNOSIS — Z7901 Long term (current) use of anticoagulants: Secondary | ICD-10-CM | POA: Diagnosis not present

## 2015-06-01 LAB — POCT INR: INR: 1.9

## 2015-06-03 ENCOUNTER — Other Ambulatory Visit: Payer: Self-pay

## 2015-06-03 DIAGNOSIS — Z1231 Encounter for screening mammogram for malignant neoplasm of breast: Secondary | ICD-10-CM

## 2015-06-04 ENCOUNTER — Other Ambulatory Visit: Payer: Self-pay | Admitting: Family Medicine

## 2015-06-04 DIAGNOSIS — M810 Age-related osteoporosis without current pathological fracture: Secondary | ICD-10-CM

## 2015-06-16 ENCOUNTER — Encounter (INDEPENDENT_AMBULATORY_CARE_PROVIDER_SITE_OTHER): Payer: Self-pay

## 2015-06-16 ENCOUNTER — Ambulatory Visit (INDEPENDENT_AMBULATORY_CARE_PROVIDER_SITE_OTHER): Payer: Medicare Other | Admitting: Emergency Medicine

## 2015-06-16 ENCOUNTER — Encounter: Payer: Self-pay | Admitting: Emergency Medicine

## 2015-06-16 VITALS — BP 120/64 | HR 78 | Ht 64.5 in | Wt 138.0 lb

## 2015-06-16 DIAGNOSIS — K219 Gastro-esophageal reflux disease without esophagitis: Secondary | ICD-10-CM

## 2015-06-16 DIAGNOSIS — J309 Allergic rhinitis, unspecified: Secondary | ICD-10-CM

## 2015-06-16 DIAGNOSIS — J302 Other seasonal allergic rhinitis: Secondary | ICD-10-CM

## 2015-06-16 DIAGNOSIS — R05 Cough: Secondary | ICD-10-CM | POA: Diagnosis not present

## 2015-06-16 DIAGNOSIS — J3089 Other allergic rhinitis: Secondary | ICD-10-CM

## 2015-06-16 DIAGNOSIS — R059 Cough, unspecified: Secondary | ICD-10-CM

## 2015-06-16 NOTE — Assessment & Plan Note (Signed)
Cetirizine, ipratropium nasal spray, chlorpheniramine as described above

## 2015-06-16 NOTE — Assessment & Plan Note (Signed)
Please continue your cetirizine, ipratropium nasal spray, chlorpheniramine. You could consider taking the chlorpheniramine in the middle of the day as well.  Please continue omeprazole as you are taking it.  Follow with Dr Lamonte Sakai in 6 months or sooner if you have any problems

## 2015-06-16 NOTE — Patient Instructions (Signed)
Please continue your cetirizine, ipratropium nasal spray, chlorpheniramine. You could consider taking the chlorpheniramine in the middle of the day as well.  Please continue omeprazole as you are taking it.  Follow with Dr Lamonte Sakai in 6 months or sooner if you have any problems

## 2015-06-16 NOTE — Assessment & Plan Note (Signed)
Omeprazole as above

## 2015-06-16 NOTE — Progress Notes (Signed)
Subjective:    Patient ID: Robin Harrell, female    DOB: 1933-07-21, 82 y.o.   MRN: [id number]  Cough   82 yo female with COPD, Chronic rhinitis, chronic cough, and GERD, hiatal hernia. 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   ROV 07/16/13 -- COPD. Chronic cough, components of GERD and PND.  She is coughing . Stopped Nexium about 3 weeks ago because she doidn't fel any better. Barium swallow 06/04/13 without overt aspiration, did have freq throat clearing and cough - they queried extra esophageal cause. Dr Janace Hoard wanted her to do pH probe, hasn't been done yet.  She is on zyrtec, ran out of flonase spray last week. She is doing the [place] daily.   ROV 10/16/13 -- Hx of COPD. Chronic cough, components of GERD and PND.  She has been doing fairly well. Saw Dr Annamaria Boots and there was no specific allergen to treat with immunotherapy. She is no longer on fluticasone, has dymista available to use prn. She is on zyrtec, wonders about stopping it for a while. She is planning for cataract sgy next week. Denies SOB.   ROV 05/08/14 -- Follow up visit for chronic cough associated with allergic rhinitis, GERD. She carries a history of possible mild obstructive disease although she had reassuring pulmonary function testing in 2010.  She has been experiencing more nasal congestion, more cough over last few months. She went back on her dymista and cetirizine. Has been having more eye watering and sneezing. I went back and reviewed her primary function testing from 2010 today. This did not show any significant obstructive lung disease.  ROV 07/28/14 -- history of chronic cough that is associated with allergic rhinitis and GERD. At her last visit her cough was still very bothersome. I added chlorpheniramine to her existing nasal saline washes, dymista, cetirizine. Also discussed adding Benadryl . She is not on anything for GERD. She returns today with some slight improvement in her cough, but continued to have allergy  trouble.   ROV 12/16/14 -- follow-up visit for chronic cough in the setting of allergic rhinitis and GERD. She had normal spirometry performed in July  2010. She is a bit unclear on her medication list, unsure exactly what she is taking. Since last visit she has had foot surgery, is now doing better. She had to do enoxaparin while she was off coumadin. Her coughing has been quiet, although she continues to have problems with nasal drainage. She is not currently on loratadine, not on nasal sprays. She had been on dymista.   ROV 06/16/15 -- patient with a history of allergic rhinitis, GERD, and associated chronic cough. She reports that her allergies have been bothersome, but that her cough has been better. She started atrovent nasal spray last time. Also using chlorpheniramine bid, zyrtec. She has more drainage in the middle of the day.  Has not required pred since last time. She will get choked on saliva or food some times. Overall she feels better than last time and her cough is better controlled.   Review of Systems  Respiratory: Positive for cough.    As per HPI     Objective:   Physical Exam Filed Vitals:   06/16/15 1410  BP: 120/64  Pulse: 78  Height: 5' 4.5" (1.638 m)  Weight: 138 lb (62.596 kg)  SpO2: 96%    Gen: Pleasant, well-nourished, in no distress,  normal affect  ENT: No lesions,  mouth clear,  oropharynx clear,  Neck: No JVD,   Lungs: No use of accessory muscles, clear without rales or rhonchi  Cardiovascular: RRR, heart sounds normal, no murmur or gallops, no peripheral edema  Musculoskeletal: No deformities, no cyanosis or clubbing  Neuro: alert, non focal  Skin: Warm, no lesions or rashes       Assessment & Plan:   COUGH, CHRONIC Please continue your cetirizine, ipratropium nasal spray, chlorpheniramine. You could consider taking the chlorpheniramine in the middle of the day as well.  Please continue omeprazole as you are taking it.  Follow with Dr  Lamonte Sakai in 6 months or sooner if you have any problems  GERD Omeprazole as above  Seasonal and perennial allergic rhinitis Cetirizine, ipratropium nasal spray, chlorpheniramine as described above    Updated Medication List Outpatient Encounter Prescriptions as of 06/16/2015  Medication Sig  . atorvastatin (LIPITOR) 10 MG tablet Take 10 mg by mouth daily at 6 PM.  . brimonidine (ALPHAGAN P) 0.1 % SOLN Place 1 drop into both eyes every 12 (twelve) hours.    . cetirizine (ZYRTEC) 10 MG tablet TAKE 1 TABLET BY MOUTH EVERY DAY  . chlorpheniramine (CHLOR-TRIMETON) 4 MG tablet Take 4 mg by mouth 2 (two) times daily as needed for allergies.  Marland Kitchen ipratropium (ATROVENT) 0.03 % nasal spray Place 2 sprays into both nostrils 3 (three) times daily as needed for rhinitis.  Marland Kitchen latanoprost (XALATAN) 0.005 % ophthalmic solution Place 1 drop into both eyes at bedtime.   Marland Kitchen losartan (COZAAR) 50 MG tablet TAKE 1 TABLET BY MOUTH ONCE DAILY  . metoprolol tartrate (LOPRESSOR) 25 MG tablet Take 1 tablet (25 mg total) by mouth 2 (two) times daily.  . multivitamin-iron-minerals-folic acid (CENTRUM) chewable tablet Chew 1 tablet by mouth daily.  Marland Kitchen omeprazole (PRILOSEC) 20 MG capsule TAKE 1 CAPSULE(20 MG) BY MOUTH TWICE DAILY BEFORE A MEAL  . warfarin (COUMADIN) 7.5 MG tablet TAKE 1 TABLET BY MOUTH EVERY DAY OR AS DIRECTED   No facility-administered encounter medications on file as of 06/16/2015.

## 2015-06-22 ENCOUNTER — Other Ambulatory Visit: Payer: Self-pay | Admitting: Emergency Medicine

## 2015-07-14 ENCOUNTER — Ambulatory Visit (INDEPENDENT_AMBULATORY_CARE_PROVIDER_SITE_OTHER): Payer: Medicare Other | Admitting: Pharmacist

## 2015-07-14 DIAGNOSIS — I82409 Acute embolism and thrombosis of unspecified deep veins of unspecified lower extremity: Secondary | ICD-10-CM | POA: Diagnosis not present

## 2015-07-14 DIAGNOSIS — D6851 Activated protein C resistance: Secondary | ICD-10-CM

## 2015-07-14 DIAGNOSIS — Z7901 Long term (current) use of anticoagulants: Secondary | ICD-10-CM | POA: Diagnosis not present

## 2015-07-14 LAB — POCT INR: INR: 1.8

## 2015-07-21 ENCOUNTER — Ambulatory Visit
Admission: RE | Admit: 2015-07-21 | Discharge: 2015-07-21 | Disposition: A | Discharge status: Home / Self Care | Payer: Medicare Other | Source: Ambulatory Visit | Attending: Family Medicine | Admitting: Family Medicine

## 2015-07-21 ENCOUNTER — Ambulatory Visit
Admission: RE | Admit: 2015-07-21 | Discharge: 2015-07-21 | Disposition: A | Discharge status: Home / Self Care | Payer: Medicare Other | Source: Ambulatory Visit

## 2015-07-21 DIAGNOSIS — Z1231 Encounter for screening mammogram for malignant neoplasm of breast: Secondary | ICD-10-CM

## 2015-07-21 DIAGNOSIS — M810 Age-related osteoporosis without current pathological fracture: Secondary | ICD-10-CM

## 2015-07-23 ENCOUNTER — Ambulatory Visit (INDEPENDENT_AMBULATORY_CARE_PROVIDER_SITE_OTHER): Payer: Medicare Other | Admitting: Podiatry

## 2015-07-23 ENCOUNTER — Ambulatory Visit (INDEPENDENT_AMBULATORY_CARE_PROVIDER_SITE_OTHER): Payer: Medicare Other

## 2015-07-23 ENCOUNTER — Encounter: Payer: Self-pay | Admitting: Podiatry

## 2015-07-23 VITALS — BP 126/69 | HR 96 | Resp 16

## 2015-07-23 DIAGNOSIS — M722 Plantar fascial fibromatosis: Secondary | ICD-10-CM

## 2015-07-23 DIAGNOSIS — M779 Enthesopathy, unspecified: Secondary | ICD-10-CM

## 2015-07-23 DIAGNOSIS — M79671 Pain in right foot: Secondary | ICD-10-CM | POA: Diagnosis not present

## 2015-07-23 MED ORDER — TRIAMCINOLONE ACETONIDE 10 MG/ML IJ SUSP
10.0000 mg | Freq: Once | INTRAMUSCULAR | Status: AC
Start: 2015-07-23 — End: 2015-07-23
  Administered 2015-07-23: 10 mg

## 2015-07-23 NOTE — Patient Instructions (Signed)

## 2015-07-24 NOTE — Progress Notes (Signed)
Subjective:     Patient ID: Robin Harrell, female   DOB: 04-[name]-1934, 82 y.o.   MRN: [id number]  HPI patient states that over the last few days she's developed a lot of pain in the bottom of her right heel   Review of Systems     Objective:   Physical Exam Neurovascular status intact muscle strength adequate with patient having significant plantar pain right heel at the insertional point of the tendon into the calcaneus with fluid buildup but is not noted currently to have pain in the calcaneus itself    Assessment:     Acute plantar fasciitis right    Plan:     H&P and x-rays reviewed. Injected the right plantar fascia 3 mg Kenalog 5 mg Xylocaine and applied fascial brace to lift the arch and instructed on reduced activity. Reappoint in the next several weeks to reevaluate  X-ray report indicates that the patient has a small spur with no indications of stress fracture noted currently

## 2015-07-27 ENCOUNTER — Other Ambulatory Visit: Payer: Self-pay | Admitting: Emergency Medicine

## 2015-08-11 ENCOUNTER — Ambulatory Visit (INDEPENDENT_AMBULATORY_CARE_PROVIDER_SITE_OTHER): Payer: Medicare Other | Admitting: Pharmacist

## 2015-08-11 DIAGNOSIS — D6851 Activated protein C resistance: Secondary | ICD-10-CM

## 2015-08-11 DIAGNOSIS — I82409 Acute embolism and thrombosis of unspecified deep veins of unspecified lower extremity: Secondary | ICD-10-CM | POA: Diagnosis not present

## 2015-08-11 DIAGNOSIS — Z7901 Long term (current) use of anticoagulants: Secondary | ICD-10-CM | POA: Diagnosis not present

## 2015-08-11 LAB — POCT INR: INR: 1.7

## 2015-09-01 ENCOUNTER — Ambulatory Visit (INDEPENDENT_AMBULATORY_CARE_PROVIDER_SITE_OTHER): Payer: Medicare Other | Admitting: Pharmacist

## 2015-09-01 DIAGNOSIS — D6851 Activated protein C resistance: Secondary | ICD-10-CM | POA: Diagnosis not present

## 2015-09-01 DIAGNOSIS — Z7901 Long term (current) use of anticoagulants: Secondary | ICD-10-CM | POA: Diagnosis not present

## 2015-09-01 DIAGNOSIS — I82409 Acute embolism and thrombosis of unspecified deep veins of unspecified lower extremity: Secondary | ICD-10-CM

## 2015-09-01 LAB — POCT INR: INR: 2.4

## 2015-09-03 DIAGNOSIS — G250 Essential tremor: Secondary | ICD-10-CM | POA: Insufficient documentation

## 2015-09-22 ENCOUNTER — Ambulatory Visit (INDEPENDENT_AMBULATORY_CARE_PROVIDER_SITE_OTHER): Payer: Medicare Other | Admitting: Podiatry

## 2015-09-22 ENCOUNTER — Encounter: Payer: Self-pay | Admitting: Podiatry

## 2015-09-22 DIAGNOSIS — M2041 Other hammer toe(s) (acquired), right foot: Secondary | ICD-10-CM

## 2015-09-22 DIAGNOSIS — M2042 Other hammer toe(s) (acquired), left foot: Secondary | ICD-10-CM

## 2015-09-22 DIAGNOSIS — Z9889 Other specified postprocedural states: Secondary | ICD-10-CM

## 2015-09-22 DIAGNOSIS — M79676 Pain in unspecified toe(s): Secondary | ICD-10-CM

## 2015-09-22 DIAGNOSIS — B351 Tinea unguium: Secondary | ICD-10-CM | POA: Diagnosis not present

## 2015-09-22 NOTE — Progress Notes (Signed)
Complaint:  Visit Type: Patient returns to my office for  preventative foot care services. Complaint: Patient states" my nails have grown long and thick and become painful to walk and wear shoes" . The patient presents for preventative foot care services. No changes to ROS  Podiatric Exam: Vascular: dorsalis pedis and posterior tibial pulses are palpable bilateral. Capillary return is immediate. Temperature gradient is WNL. Skin turgor WNL  Sensorium: Normal Semmes Weinstein monofilament test. Normal tactile sensation bilaterally. Nail Exam: Pt has thick disfigured discolored nails with subungual debris noted bilateral entire nail hallux . Ulcer Exam: There is no evidence of ulcer or pre-ulcerative changes or infection. Orthopedic Exam: Muscle tone and strength are WNL. No limitations in general ROM. No crepitus or effusions noted. Foot type and digits show no abnormalities. Bony prominences are unremarkable. Skin: No Porokeratosis. No infection or ulcers  Diagnosis:  Onychomycosis, , Pain in right toe, pain in left toes  Treatment & Plan Procedures and Treatment: Consent by patient was obtained for treatment procedures. The patient understood the discussion of treatment and procedures well. All questions were answered thoroughly reviewed. Debridement of mycotic and hypertrophic toenails, 1 through 5 bilateral and clearing of subungual debris. No ulceration, no infection noted.  Return Visit-Office Procedure: Patient instructed to return to the office for a follow up visit 3 months for continued evaluation and treatment.    Gardiner Barefoot DPM

## 2015-09-29 ENCOUNTER — Ambulatory Visit (INDEPENDENT_AMBULATORY_CARE_PROVIDER_SITE_OTHER): Payer: Medicare Other | Admitting: Pharmacist

## 2015-09-29 DIAGNOSIS — D6851 Activated protein C resistance: Secondary | ICD-10-CM | POA: Diagnosis not present

## 2015-09-29 DIAGNOSIS — Z7901 Long term (current) use of anticoagulants: Secondary | ICD-10-CM

## 2015-09-29 DIAGNOSIS — I82409 Acute embolism and thrombosis of unspecified deep veins of unspecified lower extremity: Secondary | ICD-10-CM | POA: Diagnosis not present

## 2015-09-29 LAB — POCT INR: INR: 2.3

## 2015-10-27 ENCOUNTER — Ambulatory Visit (INDEPENDENT_AMBULATORY_CARE_PROVIDER_SITE_OTHER): Payer: Medicare Other | Admitting: Pharmacist

## 2015-10-27 DIAGNOSIS — Z7901 Long term (current) use of anticoagulants: Secondary | ICD-10-CM

## 2015-10-27 DIAGNOSIS — D6851 Activated protein C resistance: Secondary | ICD-10-CM

## 2015-10-27 DIAGNOSIS — I82409 Acute embolism and thrombosis of unspecified deep veins of unspecified lower extremity: Secondary | ICD-10-CM

## 2015-10-27 LAB — POCT INR: INR: 2.4

## 2015-11-26 ENCOUNTER — Ambulatory Visit (INDEPENDENT_AMBULATORY_CARE_PROVIDER_SITE_OTHER): Payer: Medicare Other | Admitting: Pharmacist

## 2015-11-26 DIAGNOSIS — I82409 Acute embolism and thrombosis of unspecified deep veins of unspecified lower extremity: Secondary | ICD-10-CM | POA: Diagnosis not present

## 2015-11-26 DIAGNOSIS — Z7901 Long term (current) use of anticoagulants: Secondary | ICD-10-CM | POA: Diagnosis not present

## 2015-11-26 DIAGNOSIS — D6851 Activated protein C resistance: Secondary | ICD-10-CM

## 2015-11-26 LAB — POCT INR: INR: 2.5

## 2015-12-08 ENCOUNTER — Other Ambulatory Visit: Payer: Self-pay | Admitting: Cardiovascular Disease

## 2015-12-22 ENCOUNTER — Ambulatory Visit (INDEPENDENT_AMBULATORY_CARE_PROVIDER_SITE_OTHER): Payer: Medicare Other | Admitting: Podiatry

## 2015-12-22 ENCOUNTER — Encounter: Payer: Self-pay | Admitting: Podiatry

## 2015-12-22 VITALS — Ht 64.5 in | Wt 138.0 lb

## 2015-12-22 DIAGNOSIS — B351 Tinea unguium: Secondary | ICD-10-CM | POA: Diagnosis not present

## 2015-12-22 DIAGNOSIS — M79676 Pain in unspecified toe(s): Secondary | ICD-10-CM

## 2015-12-22 DIAGNOSIS — M79671 Pain in right foot: Secondary | ICD-10-CM

## 2015-12-22 NOTE — Progress Notes (Signed)
Complaint:  Visit Type: Patient returns to my office for  preventative foot care services. Complaint: Patient states" my nails have grown long and thick and become painful to walk and wear shoes" . The patient presents for preventative foot care services. No changes to ROS  Podiatric Exam: Vascular: dorsalis pedis and posterior tibial pulses are palpable bilateral. Capillary return is immediate. Temperature gradient is WNL. Skin turgor WNL  Sensorium: Normal Semmes Weinstein monofilament test. Normal tactile sensation bilaterally. Nail Exam: Pt has thick disfigured discolored nails with subungual debris noted bilateral entire nail hallux . Ulcer Exam: There is no evidence of ulcer or pre-ulcerative changes or infection. Orthopedic Exam: Muscle tone and strength are WNL. No limitations in general ROM. No crepitus or effusions noted. Foot type and digits show no abnormalities. Bony prominences are unremarkable. Skin: No Porokeratosis. No infection or ulcers  Diagnosis:  Onychomycosis, , Pain in right toe, pain in left toes  Treatment & Plan Procedures and Treatment: Consent by patient was obtained for treatment procedures. The patient understood the discussion of treatment and procedures well. All questions were answered thoroughly reviewed. Debridement of mycotic and hypertrophic toenails, 1 through 5 bilateral and clearing of subungual debris. No ulceration, no infection noted.  Return Visit-Office Procedure: Patient instructed to return to the office for a follow up visit 3 months for continued evaluation and treatment.    Gardiner Barefoot DPM

## 2015-12-29 ENCOUNTER — Ambulatory Visit (INDEPENDENT_AMBULATORY_CARE_PROVIDER_SITE_OTHER): Payer: Medicare Other | Admitting: Pharmacist

## 2015-12-29 DIAGNOSIS — D6851 Activated protein C resistance: Secondary | ICD-10-CM

## 2015-12-29 DIAGNOSIS — I82409 Acute embolism and thrombosis of unspecified deep veins of unspecified lower extremity: Secondary | ICD-10-CM

## 2015-12-29 DIAGNOSIS — Z7901 Long term (current) use of anticoagulants: Secondary | ICD-10-CM | POA: Diagnosis not present

## 2015-12-29 LAB — POCT INR: INR: 1.8

## 2016-01-07 ENCOUNTER — Other Ambulatory Visit: Payer: Self-pay | Admitting: Cardiovascular Disease

## 2016-01-07 NOTE — Telephone Encounter (Signed)
Rx(s) sent to pharmacy electronically.  

## 2016-01-20 ENCOUNTER — Other Ambulatory Visit: Payer: Self-pay | Admitting: Emergency Medicine

## 2016-01-26 ENCOUNTER — Ambulatory Visit (INDEPENDENT_AMBULATORY_CARE_PROVIDER_SITE_OTHER): Payer: Medicare Other | Admitting: Pharmacist Clinician (PhC)/ Clinical Pharmacy Specialist

## 2016-01-26 DIAGNOSIS — Z5181 Encounter for therapeutic drug level monitoring: Secondary | ICD-10-CM | POA: Diagnosis not present

## 2016-01-26 DIAGNOSIS — Z7901 Long term (current) use of anticoagulants: Secondary | ICD-10-CM

## 2016-01-26 DIAGNOSIS — D6851 Activated protein C resistance: Secondary | ICD-10-CM

## 2016-01-26 LAB — POCT INR: INR: 2.6

## 2016-01-27 ENCOUNTER — Encounter: Payer: Self-pay | Admitting: Cardiovascular Disease

## 2016-01-27 ENCOUNTER — Ambulatory Visit (INDEPENDENT_AMBULATORY_CARE_PROVIDER_SITE_OTHER): Payer: Medicare Other | Admitting: Cardiovascular Disease

## 2016-01-27 VITALS — BP 140/72 | HR 92 | Ht 62.5 in | Wt 136.8 lb

## 2016-01-27 DIAGNOSIS — D6851 Activated protein C resistance: Secondary | ICD-10-CM

## 2016-01-27 DIAGNOSIS — E7800 Pure hypercholesterolemia, unspecified: Secondary | ICD-10-CM

## 2016-01-27 DIAGNOSIS — I10 Essential (primary) hypertension: Secondary | ICD-10-CM | POA: Diagnosis not present

## 2016-01-27 NOTE — Progress Notes (Signed)
01/27/2016 Robin Harrell   March 24, 1933  [id number]  Primary Physician [name], MD Primary Cardiologist: Lorretta Harp MD [name]  HPI:  The patient is a 82 year old, thin-appearing, married Caucasian female, mother of 73, grandmother to 4 grandchildren who I last saw in the office on 11/11/14.Marland Kitchen She has a history of treated hypertension, hyperlipidemia, and GERD. She does have a history of pulmonary embolism in the past with factor V Leiden deficiency on life-long Coumadin anticoagulation. She has had a normal 2D echo and Myoview back in 2009. She was recently admitted to [facility] for 2 weeks with acute respiratory insufficiency requiring intubation related to pneumonia and RSV. There was a thought that she may have had congestive heart failure as well, though a 2D echo was normal. She did have a Holter monitor that showed a large number of bigeminal PVCs which she is asymptomatic from and currently is on low-dose beta blocker. She currently denies chest pain or shortness of breath.   Current Outpatient Prescriptions  Medication Sig Dispense Refill  . atorvastatin (LIPITOR) 10 MG tablet Take 10 mg by mouth daily at 6 PM.    . brimonidine (ALPHAGAN P) 0.1 % SOLN Place 1 drop into both eyes every 12 (twelve) hours.      . cetirizine (ZYRTEC) 10 MG tablet TAKE 1 TABLET BY MOUTH EVERY DAY 30 tablet 11  . chlorpheniramine (CHLOR-TRIMETON) 4 MG tablet Take 4 mg by mouth 2 (two) times daily as needed for allergies.    Marland Kitchen ipratropium (ATROVENT) 0.03 % nasal spray Place 2 sprays into both nostrils 3 (three) times daily as needed for rhinitis. 30 mL 12  . latanoprost (XALATAN) 0.005 % ophthalmic solution Place 1 drop into both eyes at bedtime.     Marland Kitchen losartan (COZAAR) 50 MG tablet TAKE 1 TABLET BY MOUTH ONCE DAILY    . metoprolol tartrate (LOPRESSOR) 25 MG tablet Take 1 tablet (25 mg total) by mouth 2 (two) times daily. 60 tablet 0  .  multivitamin-iron-minerals-folic acid (CENTRUM) chewable tablet Chew 1 tablet by mouth daily.    Marland Kitchen omeprazole (PRILOSEC) 20 MG capsule TAKE 1 CAPSULE(20 MG) BY MOUTH TWICE DAILY BEFORE A MEAL 60 capsule 2  . warfarin (COUMADIN) 7.5 MG tablet Take 1/2 to 1 tablet by mouth daily as directed by coumadin clinic 90 tablet 0   No current facility-administered medications for this visit.     Allergies  Allergen Reactions  . Codeine Nausea Only  . Erythromycin Nausea And Vomiting    Social History   Social History  . Marital status: Married    Spouse name: N/A  . Number of children: 4  . Years of education: 13.5   Occupational History  . Network engineer - retired    Social History Main Topics  . Smoking status: Former Smoker    Years: 1.00    Types: Cigarettes    Quit date: 02/21/1958  . Smokeless tobacco: Never Used  . Alcohol use No  . Drug use: No  . Sexual activity: Not on file   Other Topics Concern  . Not on file   Social History Narrative  . No narrative on file     Review of Systems: General: negative for chills, fever, night sweats or weight changes.  Cardiovascular: negative for chest pain, dyspnea on exertion, edema, orthopnea, palpitations, paroxysmal nocturnal dyspnea or shortness of breath Dermatological: negative for rash Respiratory: negative for cough or wheezing Urologic: negative for hematuria Abdominal:  negative for nausea, vomiting, diarrhea, bright red blood per rectum, melena, or hematemesis Neurologic: negative for visual changes, syncope, or dizziness All other systems reviewed and are otherwise negative except as noted above.    Blood pressure 140/72, pulse 92, height 5' 2.5" (1.588 m), weight 136 lb 12.8 oz (62.1 kg).  General appearance: alert and no distress Neck: no adenopathy, no carotid bruit, no JVD, supple, symmetrical, trachea midline and thyroid not enlarged, symmetric, no tenderness/mass/nodules Lungs: clear to auscultation  bilaterally Heart: regular rate and rhythm, S1, S2 normal, no murmur, click, rub or gallop Extremities: extremities normal, atraumatic, no cyanosis or edema  EKG normal sinus rhythm at 92 with septal Q waves. Per se reviewed this EKG  ASSESSMENT AND PLAN:   Essential hypertension History of hypertension with blood pressure measured 140/72. She is on losartan and metoprolol. Continue current meds at current dosing  Hyperlipidemia History of hyperlipidemia on statin therapy followed by her PCP  Factor 5 Leiden mutation, heterozygous History of factor V Leiden deficiency and pulmonary embolus in the past on chronic Coumadin anticoagulation      Lorretta Harp MD [facility], [facility] 01/27/2016 4:12 PM

## 2016-01-27 NOTE — Assessment & Plan Note (Signed)
History of factor V Leiden deficiency and pulmonary embolus in the past on chronic Coumadin anticoagulation

## 2016-01-27 NOTE — Assessment & Plan Note (Signed)
History of hyperlipidemia on statin therapy followed by her PCP. 

## 2016-01-27 NOTE — Patient Instructions (Signed)
Medication Instructions: Your physician recommends that you continue on your current medications as directed. Please refer to the Current Medication list given to you today.   Follow-Up: Your physician recommends that you schedule a follow-up appointment as needed with Dr. Berry.  If you need a refill on your cardiac medications before your next appointment, please call your pharmacy.  

## 2016-01-27 NOTE — Assessment & Plan Note (Signed)
History of hypertension with blood pressure measured 140/72. She is on losartan and metoprolol. Continue current meds at current dosing

## 2016-02-04 ENCOUNTER — Other Ambulatory Visit: Payer: Self-pay | Admitting: Cardiovascular Disease

## 2016-02-05 ENCOUNTER — Encounter: Payer: Self-pay | Admitting: Emergency Medicine

## 2016-02-05 ENCOUNTER — Ambulatory Visit (INDEPENDENT_AMBULATORY_CARE_PROVIDER_SITE_OTHER): Payer: Medicare Other | Admitting: Emergency Medicine

## 2016-02-05 DIAGNOSIS — J3089 Other allergic rhinitis: Secondary | ICD-10-CM

## 2016-02-05 DIAGNOSIS — K219 Gastro-esophageal reflux disease without esophagitis: Secondary | ICD-10-CM | POA: Diagnosis not present

## 2016-02-05 DIAGNOSIS — R05 Cough: Secondary | ICD-10-CM

## 2016-02-05 DIAGNOSIS — R059 Cough, unspecified: Secondary | ICD-10-CM

## 2016-02-05 DIAGNOSIS — J302 Other seasonal allergic rhinitis: Secondary | ICD-10-CM

## 2016-02-05 DIAGNOSIS — D6851 Activated protein C resistance: Secondary | ICD-10-CM

## 2016-02-05 NOTE — Progress Notes (Signed)
Subjective:    Patient ID: Robin Harrell, female    DOB: 1933-11-27, 82 y.o.   MRN: [id number]  Cough    82 yo female with COPD, Chronic rhinitis, chronic cough, and GERD, hiatal hernia. 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    ROV 06/16/15 -- patient with a history of allergic rhinitis, GERD, and associated chronic cough. She reports that her allergies have been bothersome, but that her cough has been better. She started atrovent nasal spray last time. Also using chlorpheniramine bid, zyrtec. She has more drainage in the middle of the day.  Has not required pred since last time. She will get choked on saliva or food some times. Overall she feels better than last time and her cough is better controlled.  ROV 02/05/16 -- this is a follow-up visit for chronic cough in the setting of allergic rhinitis and GERD. Also with a history of hiatal hernia, factor V Leiden deficiency with chronic PE. She has been doing fairly well. No GERD sx on omeprazole. She is on ipratropium nasal spray, zyrtec, chlorpheniramine twice a day. Minimal cough right now. Some hoarse voice.    Review of Systems  Respiratory: Positive for cough.    As per HPI     Objective:   Physical Exam Vitals:   02/05/16 1003  BP: 112/70  BP Location: Left Arm  Cuff Size: Normal  Pulse: 78  SpO2: 98%  Weight: 137 lb (62.1 kg)  Height: 5' 2.5" (1.588 m)    Gen: Pleasant, well-nourished, in no distress,  normal affect  ENT: No lesions,  mouth clear,  oropharynx clear,   Neck: No JVD,   Lungs: No use of accessory muscles, clear without rales or rhonchi  Cardiovascular: RRR, heart sounds normal, no murmur or gallops, no peripheral edema  Musculoskeletal: No deformities, no cyanosis or clubbing  Neuro: alert, non focal  Skin: Warm, no lesions or rashes       Assessment & Plan:   COUGH, CHRONIC Please continue your cetrizine daily You may decrease chlorpheniramine to every 6 hours as needed.  You  may stop your ipratropium nasal spray to see how you tolerate this. You will probably will have to add this back in the Spring in preparation for allergy season.  Continue omeprazole as you are taking it Follow with Dr Lamonte Sakai in 6 months or sooner if you have any problems  Factor 5 Leiden mutation, heterozygous Continue coumadin  GERD Omeprazole as ordered  Seasonal and perennial allergic rhinitis Feels that she is currently doing well. She would like to scale back on her maintenance allergy routine during the off season. I believe this is okay but have encouraged her to restart her maintenance medications if she develops any new symptoms. She also knows that she needs to go back on her maintenance ipratropium nasal spray in the springtime 2018.  Baltazar Apo, MD, PhD 02/05/2016, 10:23 AM [facility] [phone] or if no answer [phone]   Updated Medication List Outpatient Encounter Prescriptions as of 02/05/2016  Medication Sig Dispense Refill  . atorvastatin (LIPITOR) 10 MG tablet Take 10 mg by mouth daily at 6 PM.    . brimonidine (ALPHAGAN P) 0.1 % SOLN Place 1 drop into both eyes every 12 (twelve) hours.      . cetirizine (ZYRTEC) 10 MG tablet TAKE 1 TABLET BY MOUTH EVERY DAY 30 tablet 11  . chlorpheniramine (CHLOR-TRIMETON) 4 MG tablet Take 4 mg by mouth 2 (  two) times daily as needed for allergies.    Marland Kitchen ipratropium (ATROVENT) 0.03 % nasal spray Place 2 sprays into both nostrils 3 (three) times daily as needed for rhinitis. 30 mL 12  . latanoprost (XALATAN) 0.005 % ophthalmic solution Place 1 drop into both eyes at bedtime.     Marland Kitchen losartan (COZAAR) 50 MG tablet TAKE 1 TABLET BY MOUTH ONCE DAILY    . metoprolol tartrate (LOPRESSOR) 25 MG tablet TAKE 1 TABLET BY MOUTH TWICE DAILY. CONTACT OFFICE FOR ADDITIONAL REFILLS 60 tablet 11  . multivitamin-iron-minerals-folic acid (CENTRUM) chewable tablet Chew 1 tablet by mouth daily.    Marland Kitchen omeprazole (PRILOSEC) 20 MG  capsule TAKE 1 CAPSULE(20 MG) BY MOUTH TWICE DAILY BEFORE A MEAL 60 capsule 2  . warfarin (COUMADIN) 7.5 MG tablet Take 1/2 to 1 tablet by mouth daily as directed by coumadin clinic 90 tablet 0   No facility-administered encounter medications on file as of 02/05/2016.

## 2016-02-05 NOTE — Assessment & Plan Note (Signed)
Continue coumadin.  

## 2016-02-05 NOTE — Assessment & Plan Note (Signed)
Please continue your cetrizine daily You may decrease chlorpheniramine to every 6 hours as needed.  You may stop your ipratropium nasal spray to see how you tolerate this. You will probably will have to add this back in the Spring in preparation for allergy season.  Continue omeprazole as you are taking it Follow with Dr Lamonte Sakai in 6 months or sooner if you have any problems

## 2016-02-05 NOTE — Patient Instructions (Signed)
Please continue your cetrizine daily You may decrease chlorpheniramine to every 6 hours as needed.  You may stop your ipratropium nasal spray to see how you tolerate this. You will probably will have to add this back in the Spring in preparation for allergy season.  Continue omeprazole as you are taking it Follow with Dr Lamonte Sakai in 6 months or sooner if you have any problems

## 2016-02-05 NOTE — Assessment & Plan Note (Signed)
Feels that she is currently doing well. She would like to scale back on her maintenance allergy routine during the off season. I believe this is okay but have encouraged her to restart her maintenance medications if she develops any new symptoms. She also knows that she needs to go back on her maintenance ipratropium nasal spray in the springtime 2018.

## 2016-02-05 NOTE — Assessment & Plan Note (Signed)
Omeprazole as ordered °

## 2016-02-23 ENCOUNTER — Ambulatory Visit (INDEPENDENT_AMBULATORY_CARE_PROVIDER_SITE_OTHER): Payer: Medicare Other | Admitting: Pharmacist Clinician (PhC)/ Clinical Pharmacy Specialist

## 2016-02-23 DIAGNOSIS — D6851 Activated protein C resistance: Secondary | ICD-10-CM

## 2016-02-23 DIAGNOSIS — Z5181 Encounter for therapeutic drug level monitoring: Secondary | ICD-10-CM | POA: Diagnosis not present

## 2016-02-23 DIAGNOSIS — Z7901 Long term (current) use of anticoagulants: Secondary | ICD-10-CM

## 2016-02-23 LAB — POCT INR: INR: 3.2

## 2016-03-14 ENCOUNTER — Other Ambulatory Visit: Payer: Self-pay | Admitting: Cardiovascular Disease

## 2016-03-15 ENCOUNTER — Other Ambulatory Visit: Payer: Self-pay

## 2016-03-15 ENCOUNTER — Other Ambulatory Visit: Payer: Self-pay | Admitting: *Deleted

## 2016-03-15 MED ORDER — CETIRIZINE HCL 10 MG PO TABS: 10.0000 mg | ORAL_TABLET | Freq: Every day | ORAL | 5 refills | Status: DC

## 2016-03-15 MED ORDER — METOPROLOL TARTRATE 25 MG PO TABS: ORAL_TABLET | ORAL | 11 refills | Status: DC

## 2016-03-22 ENCOUNTER — Ambulatory Visit: Payer: Medicare Other | Admitting: Podiatry

## 2016-03-23 ENCOUNTER — Inpatient Hospital Stay (HOSPITAL_COMMUNITY)
Admission: EM | Admit: 2016-03-23 | Discharge: 2016-03-26 | DRG: 194 | Disposition: A | Discharge status: Home / Self Care | Payer: Medicare Other | Attending: Family Medicine | Admitting: Family Medicine

## 2016-03-23 ENCOUNTER — Encounter (HOSPITAL_COMMUNITY): Payer: Self-pay | Admitting: Emergency Medicine

## 2016-03-23 ENCOUNTER — Emergency Department (HOSPITAL_COMMUNITY): Payer: Medicare Other

## 2016-03-23 DIAGNOSIS — D6851 Activated protein C resistance: Secondary | ICD-10-CM | POA: Diagnosis not present

## 2016-03-23 DIAGNOSIS — L89152 Pressure ulcer of sacral region, stage 2: Secondary | ICD-10-CM | POA: Diagnosis present

## 2016-03-23 DIAGNOSIS — D696 Thrombocytopenia, unspecified: Secondary | ICD-10-CM | POA: Diagnosis present

## 2016-03-23 DIAGNOSIS — Z881 Allergy status to other antibiotic agents status: Secondary | ICD-10-CM

## 2016-03-23 DIAGNOSIS — E860 Dehydration: Secondary | ICD-10-CM

## 2016-03-23 DIAGNOSIS — E876 Hypokalemia: Secondary | ICD-10-CM | POA: Diagnosis present

## 2016-03-23 DIAGNOSIS — Z9049 Acquired absence of other specified parts of digestive tract: Secondary | ICD-10-CM

## 2016-03-23 DIAGNOSIS — N390 Urinary tract infection, site not specified: Secondary | ICD-10-CM | POA: Diagnosis present

## 2016-03-23 DIAGNOSIS — Z7901 Long term (current) use of anticoagulants: Secondary | ICD-10-CM

## 2016-03-23 DIAGNOSIS — L8990 Pressure ulcer of unspecified site, unspecified stage: Secondary | ICD-10-CM | POA: Insufficient documentation

## 2016-03-23 DIAGNOSIS — J40 Bronchitis, not specified as acute or chronic: Secondary | ICD-10-CM | POA: Diagnosis present

## 2016-03-23 DIAGNOSIS — I10 Essential (primary) hypertension: Secondary | ICD-10-CM | POA: Diagnosis present

## 2016-03-23 DIAGNOSIS — J111 Influenza due to unidentified influenza virus with other respiratory manifestations: Secondary | ICD-10-CM | POA: Diagnosis not present

## 2016-03-23 DIAGNOSIS — Z79899 Other long term (current) drug therapy: Secondary | ICD-10-CM

## 2016-03-23 DIAGNOSIS — J09X2 Influenza due to identified novel influenza A virus with other respiratory manifestations: Principal | ICD-10-CM | POA: Diagnosis present

## 2016-03-23 DIAGNOSIS — N3000 Acute cystitis without hematuria: Secondary | ICD-10-CM | POA: Diagnosis not present

## 2016-03-23 DIAGNOSIS — Z87891 Personal history of nicotine dependence: Secondary | ICD-10-CM

## 2016-03-23 DIAGNOSIS — R531 Weakness: Secondary | ICD-10-CM | POA: Diagnosis not present

## 2016-03-23 DIAGNOSIS — N179 Acute kidney failure, unspecified: Secondary | ICD-10-CM | POA: Diagnosis not present

## 2016-03-23 DIAGNOSIS — R69 Illness, unspecified: Secondary | ICD-10-CM

## 2016-03-23 DIAGNOSIS — R05 Cough: Secondary | ICD-10-CM | POA: Diagnosis not present

## 2016-03-23 DIAGNOSIS — Z86718 Personal history of other venous thrombosis and embolism: Secondary | ICD-10-CM

## 2016-03-23 DIAGNOSIS — E039 Hypothyroidism, unspecified: Secondary | ICD-10-CM | POA: Diagnosis present

## 2016-03-23 DIAGNOSIS — K219 Gastro-esophageal reflux disease without esophagitis: Secondary | ICD-10-CM | POA: Diagnosis present

## 2016-03-23 DIAGNOSIS — E785 Hyperlipidemia, unspecified: Secondary | ICD-10-CM | POA: Diagnosis present

## 2016-03-23 DIAGNOSIS — Z86711 Personal history of pulmonary embolism: Secondary | ICD-10-CM

## 2016-03-23 DIAGNOSIS — Z8249 Family history of ischemic heart disease and other diseases of the circulatory system: Secondary | ICD-10-CM

## 2016-03-23 DIAGNOSIS — Z885 Allergy status to narcotic agent status: Secondary | ICD-10-CM

## 2016-03-23 DIAGNOSIS — B9620 Unspecified Escherichia coli [E. coli] as the cause of diseases classified elsewhere: Secondary | ICD-10-CM | POA: Diagnosis present

## 2016-03-23 LAB — URINALYSIS, ROUTINE W REFLEX MICROSCOPIC
Bilirubin Urine: NEGATIVE
Glucose, UA: NEGATIVE mg/dL
KETONES UR: 5 mg/dL — AB
Nitrite: NEGATIVE
PROTEIN: 30 mg/dL — AB
SQUAMOUS EPITHELIAL / LPF: NONE SEEN
Specific Gravity, Urine: 1.017 (ref 1.005–1.030)
pH: 5 (ref 5.0–8.0)

## 2016-03-23 LAB — CBC WITH DIFFERENTIAL/PLATELET
BASOS ABS: 0 10*3/uL (ref 0.0–0.1)
Basophils Relative: 0 %
Eosinophils Absolute: 0 10*3/uL (ref 0.0–0.7)
Eosinophils Relative: 0 %
HEMATOCRIT: 39.3 % (ref 36.0–46.0)
HEMOGLOBIN: 13.4 g/dL (ref 12.0–15.0)
LYMPHS ABS: 1 10*3/uL (ref 0.7–4.0)
LYMPHS PCT: 26 %
MCH: 30.7 pg (ref 26.0–34.0)
MCHC: 34.1 g/dL (ref 30.0–36.0)
MCV: 89.9 fL (ref 78.0–100.0)
MONO ABS: 0.6 10*3/uL (ref 0.1–1.0)
Monocytes Relative: 15 %
NEUTROS ABS: 2.4 10*3/uL (ref 1.7–7.7)
Neutrophils Relative %: 59 %
Platelets: 97 10*3/uL — ABNORMAL LOW (ref 150–400)
RBC: 4.37 MIL/uL (ref 3.87–5.11)
RDW: 13.1 % (ref 11.5–15.5)
WBC: 4 10*3/uL (ref 4.0–10.5)

## 2016-03-23 LAB — COMPREHENSIVE METABOLIC PANEL
ALBUMIN: 4 g/dL (ref 3.5–5.0)
ALT: 30 U/L (ref 14–54)
ANION GAP: 13 (ref 5–15)
AST: 61 U/L — ABNORMAL HIGH (ref 15–41)
Alkaline Phosphatase: 64 U/L (ref 38–126)
BILIRUBIN TOTAL: 0.9 mg/dL (ref 0.3–1.2)
BUN: 24 mg/dL — ABNORMAL HIGH (ref 6–20)
CHLORIDE: 99 mmol/L — AB (ref 101–111)
CO2: 23 mmol/L (ref 22–32)
Calcium: 8.6 mg/dL — ABNORMAL LOW (ref 8.9–10.3)
Creatinine, Ser: 1.31 mg/dL — ABNORMAL HIGH (ref 0.44–1.00)
GFR calc Af Amer: 43 mL/min — ABNORMAL LOW (ref >60)
GFR calc non Af Amer: 37 mL/min — ABNORMAL LOW (ref >60)
GLUCOSE: 107 mg/dL — AB (ref 65–99)
POTASSIUM: 3 mmol/L — AB (ref 3.5–5.1)
SODIUM: 135 mmol/L (ref 135–145)
TOTAL PROTEIN: 7.3 g/dL (ref 6.5–8.1)

## 2016-03-23 LAB — I-STAT TROPONIN, ED: TROPONIN I, POC: 0.04 ng/mL (ref 0.00–0.08)

## 2016-03-23 LAB — PROTIME-INR
INR: 1.84
PROTHROMBIN TIME: 21.5 s — AB (ref 11.4–15.2)

## 2016-03-23 LAB — CBG MONITORING, ED: GLUCOSE-CAPILLARY: 100 mg/dL — AB (ref 65–99)

## 2016-03-23 LAB — INFLUENZA PANEL BY PCR (TYPE A & B)
Influenza A By PCR: POSITIVE — AB
Influenza B By PCR: NEGATIVE

## 2016-03-23 LAB — I-STAT CG4 LACTIC ACID, ED
Lactic Acid, Venous: 1.81 mmol/L (ref 0.5–1.9)
Lactic Acid, Venous: 1.63 mmol/L (ref 0.5–1.9)

## 2016-03-23 LAB — BRAIN NATRIURETIC PEPTIDE: B NATRIURETIC PEPTIDE 5: 46.9 pg/mL (ref 0.0–100.0)

## 2016-03-23 MED ORDER — DEXTROSE 5 % IV SOLN
1.0000 g | Freq: Once | INTRAVENOUS | Status: AC
  Administered 2016-03-23: 1 g via INTRAVENOUS
  Filled 2016-03-23: qty 10

## 2016-03-23 MED ORDER — PANTOPRAZOLE SODIUM 40 MG PO TBEC
40.0000 mg | DELAYED_RELEASE_TABLET | Freq: Every day | ORAL | Status: DC
  Administered 2016-03-24 – 2016-03-26 (×3): 40 mg via ORAL
  Filled 2016-03-23 (×3): qty 1

## 2016-03-23 MED ORDER — BRIMONIDINE TARTRATE 0.15 % OP SOLN
1.0000 [drp] | Freq: Two times a day (BID) | OPHTHALMIC | Status: DC
  Administered 2016-03-24 – 2016-03-26 (×6): 1 [drp] via OPHTHALMIC
  Filled 2016-03-23: qty 5

## 2016-03-23 MED ORDER — WARFARIN SODIUM 2.5 MG PO TABS
7.5000 mg | ORAL_TABLET | ORAL | Status: AC
  Administered 2016-03-23: 7.5 mg via ORAL
  Filled 2016-03-23: qty 1

## 2016-03-23 MED ORDER — METOPROLOL TARTRATE 25 MG PO TABS
25.0000 mg | ORAL_TABLET | Freq: Two times a day (BID) | ORAL | Status: DC
  Administered 2016-03-24 – 2016-03-26 (×6): 25 mg via ORAL
  Filled 2016-03-23 (×6): qty 1

## 2016-03-23 MED ORDER — POTASSIUM CHLORIDE IN NACL 20-0.9 MEQ/L-% IV SOLN
INTRAVENOUS | Status: DC
  Filled 2016-03-23: qty 1000

## 2016-03-23 MED ORDER — OSELTAMIVIR PHOSPHATE 30 MG PO CAPS: 30.0000 mg | ORAL_CAPSULE | Freq: Two times a day (BID) | ORAL | Status: DC

## 2016-03-23 MED ORDER — HYDRALAZINE HCL 20 MG/ML IJ SOLN: 10.0000 mg | INTRAMUSCULAR | Status: DC | PRN

## 2016-03-23 MED ORDER — SODIUM CHLORIDE 0.9 % IV BOLUS (SEPSIS)
1000.0000 mL | Freq: Once | INTRAVENOUS | Status: AC
  Administered 2016-03-23: 1000 mL via INTRAVENOUS

## 2016-03-23 MED ORDER — POTASSIUM CHLORIDE IN NACL 20-0.9 MEQ/L-% IV SOLN
INTRAVENOUS | Status: AC
  Administered 2016-03-24: via INTRAVENOUS
  Filled 2016-03-23: qty 1000

## 2016-03-23 MED ORDER — ACETAMINOPHEN 325 MG PO TABS
650.0000 mg | ORAL_TABLET | Freq: Four times a day (QID) | ORAL | Status: DC | PRN
  Administered 2016-03-24 – 2016-03-25 (×2): 650 mg via ORAL
  Filled 2016-03-23 (×2): qty 2

## 2016-03-23 MED ORDER — LATANOPROST 0.005 % OP SOLN
1.0000 [drp] | Freq: Every day | OPHTHALMIC | Status: DC
  Administered 2016-03-24 – 2016-03-25 (×3): 1 [drp] via OPHTHALMIC
  Filled 2016-03-23: qty 2.5

## 2016-03-23 MED ORDER — POTASSIUM CHLORIDE CRYS ER 20 MEQ PO TBCR
40.0000 meq | EXTENDED_RELEASE_TABLET | Freq: Once | ORAL | Status: AC
  Administered 2016-03-23: 40 meq via ORAL
  Filled 2016-03-23: qty 2

## 2016-03-23 MED ORDER — OSELTAMIVIR PHOSPHATE 75 MG PO CAPS
75.0000 mg | ORAL_CAPSULE | Freq: Two times a day (BID) | ORAL | Status: DC
  Administered 2016-03-23: 75 mg via ORAL
  Filled 2016-03-23: qty 1

## 2016-03-23 MED ORDER — ACETAMINOPHEN 650 MG RE SUPP: 650.0000 mg | Freq: Four times a day (QID) | RECTAL | Status: DC | PRN

## 2016-03-23 MED ORDER — WARFARIN - PHARMACIST DOSING INPATIENT: Freq: Every day | Status: DC

## 2016-03-23 MED ORDER — LEVOTHYROXINE SODIUM 25 MCG PO TABS
25.0000 ug | ORAL_TABLET | Freq: Every day | ORAL | Status: DC
  Administered 2016-03-24 – 2016-03-26 (×3): 25 ug via ORAL
  Filled 2016-03-23 (×3): qty 1

## 2016-03-23 MED ORDER — LORATADINE 10 MG PO TABS
10.0000 mg | ORAL_TABLET | Freq: Every day | ORAL | Status: DC
  Administered 2016-03-24 – 2016-03-26 (×3): 10 mg via ORAL
  Filled 2016-03-23 (×3): qty 1

## 2016-03-23 MED ORDER — ONDANSETRON HCL 4 MG PO TABS: 4.0000 mg | ORAL_TABLET | Freq: Four times a day (QID) | ORAL | Status: DC | PRN

## 2016-03-23 MED ORDER — ONDANSETRON HCL 4 MG/2ML IJ SOLN: 4.0000 mg | Freq: Four times a day (QID) | INTRAMUSCULAR | Status: DC | PRN

## 2016-03-23 MED ORDER — ENSURE ENLIVE PO LIQD
237.0000 mL | Freq: Two times a day (BID) | ORAL | Status: DC
  Administered 2016-03-24 – 2016-03-25 (×3): 237 mL via ORAL

## 2016-03-23 MED ORDER — ATORVASTATIN CALCIUM 10 MG PO TABS
10.0000 mg | ORAL_TABLET | Freq: Every day | ORAL | Status: DC
Start: 2016-03-24 — End: 2016-03-26
  Administered 2016-03-24 – 2016-03-25 (×2): 10 mg via ORAL
  Filled 2016-03-23 (×2): qty 1

## 2016-03-23 NOTE — ED Provider Notes (Signed)
[facility] DEPT Provider Note   CSN: [id number] Arrival date & time: 03/23/16  1526     History   Chief Complaint Chief Complaint  Patient presents with  . Weakness    HPI Robin Harrell is a 83 y.o. female.  HPI   83 year old female with past medical history of hypertension, hyperlipidemia, factor V Leiden on Coumadin, who presents with generalized fatigue. Patient states that her symptoms started 3-4 days ago. She developed nasal congestion, sore throat, and generalized fatigue. She has also had nausea as well as a productive cough productive of yellow green and blood-tinged sputum. Over the last several days, she has had only one meal per day due to generalized nausea as well as loss of appetite. She has had subjective chills but no known fevers. She denies any overt myalgias. She does have known sick contacts in her husband, who is currently being evaluated. She denies any current chest pain. She has been taking her other medications as prescribed. She is not immune suppressed.  Past Medical History:  Diagnosis Date  . Allergic rhinitis   . Cough   . Difficulty in swallowing    w/ occasional aspiration  . DVT (deep venous thrombosis) ([place])   . Dyslipidemia   . Factor V Leiden deficiency    lifelong coumadin  . GERD (gastroesophageal reflux disease)   . History of nuclear stress test 12/28/2007   lexiscan; low risk   . HTN (hypertension)   . PND (post-nasal drip)   . Pulmonary embolism ([place])   . PVC's (premature ventricular contractions)     Patient Active Problem List   Diagnosis Date Noted  . HAV (hallux abducto valgus) 01/12/2015  . Right ankle pain 01/12/2015  . Hyperlipidemia 11/19/2012  . Long term (current) use of anticoagulants 05/10/2012  . Factor 5 Leiden mutation, heterozygous ([employer]) 05/10/2012  . Essential hypertension 11/15/2007  . DVT 11/15/2007  . Seasonal and perennial allergic rhinitis 11/15/2007  . GERD 11/15/2007  . COUGH, CHRONIC  11/15/2007    Past Surgical History:  Procedure Laterality Date  . BUNIONECTOMY    . CHOLECYSTECTOMY  1973  . HAMMER TOE SURGERY    . history of sleep study  12/27/2007   AHI during total sleep 0.9/hr and REM 1.5/hr; RDI during total sleep6.0/hr and REM 6.1/hr  . 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 TRANSTHORACIC ECHOCARDIOGRAM  12/28/2007   borderline conc LVH with normal systolic function; MV mod thickened with mild MVP & mild MR    OB History    No data available       Home Medications    Prior to Admission medications   Medication Sig Start Date End Date Taking? Authorizing Provider  atorvastatin (LIPITOR) 10 MG tablet Take 10 mg by mouth daily at 6 PM.   Yes Historical Provider, MD  brimonidine (ALPHAGAN P) 0.1 % SOLN Place 1 drop into both eyes every 12 (twelve) hours.     Yes Historical Provider, MD  latanoprost (XALATAN) 0.005 % ophthalmic solution Place 1 drop into both eyes at bedtime.  10/08/10  Yes Historical Provider, MD  levothyroxine (SYNTHROID, LEVOTHROID) 25 MCG tablet Take 25 mcg by mouth daily before breakfast.  03/08/16  Yes Historical Provider, MD  losartan (COZAAR) 50 MG tablet TAKE 1 TABLET BY MOUTH ONCE DAILY 08/08/14  Yes Historical Provider, MD  metoprolol tartrate (LOPRESSOR) 25 MG tablet TAKE 1 TABLET BY MOUTH TWICE DAILY 03/15/16  Yes Lorretta Harp, MD  multivitamin-iron-minerals-folic acid (  CENTRUM) chewable tablet Chew 1 tablet by mouth daily.   Yes Historical Provider, MD  omeprazole (PRILOSEC) 20 MG capsule TAKE 1 CAPSULE(20 MG) BY MOUTH TWICE DAILY BEFORE A MEAL 01/20/16  Yes Collene Gobble, MD  warfarin (COUMADIN) 7.5 MG tablet Take 1/2 to 1 tablet by mouth daily as directed by coumadin clinic Patient taking differently: Take 3.75-7.5 mg by mouth daily. Take 0.5 tablet (3.75 mg) on MWF and Take 1 tablet (7.5 mg) on Sun, Tues, Thurs & Sat. 12/08/15  Yes Lorretta Harp, MD  cetirizine (ZYRTEC) 10 MG tablet Take 1 tablet (10 mg total) by  mouth daily. 03/15/16   Collene Gobble, MD  chlorpheniramine (CHLOR-TRIMETON) 4 MG tablet Take 4 mg by mouth 2 (two) times daily as needed for allergies.    Historical Provider, MD  ipratropium (ATROVENT) 0.03 % nasal spray Place 2 sprays into both nostrils 3 (three) times daily as needed for rhinitis. 12/17/14   Collene Gobble, MD    Family History Family History  Problem Relation Age of Onset  . Coronary artery disease Maternal Grandfather     MI  . Coronary artery disease Paternal Grandfather     MI  . Throat cancer Paternal Grandfather   . Allergic rhinitis Sister     Social History Social History  Substance Use Topics  . Smoking status: Former Smoker    Years: 1.00    Types: Cigarettes    Quit date: 02/21/1958  . Smokeless tobacco: Never Used  . Alcohol use No     Allergies   Codeine and Erythromycin   Review of Systems Review of Systems  Constitutional: Positive for chills and fatigue. Negative for fever.  HENT: Positive for congestion and sore throat.   Eyes: Negative for visual disturbance.  Respiratory: Positive for cough and shortness of breath. Negative for wheezing.   Cardiovascular: Negative for chest pain and leg swelling.  Gastrointestinal: Positive for nausea. Negative for abdominal pain.  Genitourinary: Negative for dysuria and flank pain.  Musculoskeletal: Negative for neck pain.  Skin: Negative for rash.  Allergic/Immunologic: Negative for immunocompromised state.  Neurological: Negative for syncope, weakness and headaches.     Physical Exam Updated Vital Signs BP 151/78 (BP Location: Left Arm)   Pulse 83   Temp 99.3 F (37.4 C) (Oral)   Resp 14   Ht 5' 3.5" (1.613 m)   Wt 135 lb (61.2 kg)   SpO2 100%   BMI 23.54 kg/m   Physical Exam  Constitutional: She is oriented to person, place, and time. She appears well-developed and well-nourished. No distress.  HENT:  Head: Normocephalic and atraumatic.  Mouth/Throat: No oropharyngeal exudate.    Dry mucous membranes  Eyes: Conjunctivae are normal. Pupils are equal, round, and reactive to light.  Neck: Neck supple.  Cardiovascular: Normal rate, regular rhythm and normal heart sounds.  Exam reveals no friction rub.   No murmur heard. Pulmonary/Chest: Effort normal. No respiratory distress. She has decreased breath sounds. She has no wheezes. She has rhonchi in the right lower field and the left lower field. She has no rales.  Abdominal: Soft. Bowel sounds are normal. She exhibits no distension. There is no tenderness. There is no guarding.  Musculoskeletal: She exhibits no edema.  Neurological: She is alert and oriented to person, place, and time. She exhibits normal muscle tone.  Skin: Skin is warm. Capillary refill takes less than 2 seconds.  Psychiatric: She has a normal mood and affect.  Nursing note and  vitals reviewed.    ED Treatments / Results  Labs (all labs ordered are listed, but only abnormal results are displayed) Labs Reviewed  URINALYSIS, ROUTINE W REFLEX MICROSCOPIC - Abnormal; Notable for the following:       Result Value   APPearance CLOUDY (*)    Hgb urine dipstick SMALL (*)    Ketones, ur 5 (*)    Protein, ur 30 (*)    Leukocytes, UA MODERATE (*)    Bacteria, UA MANY (*)    All other components within normal limits  COMPREHENSIVE METABOLIC PANEL - Abnormal; Notable for the following:    Potassium 3.0 (*)    Chloride 99 (*)    Glucose, Bld 107 (*)    BUN 24 (*)    Creatinine, Ser 1.31 (*)    Calcium 8.6 (*)    AST 61 (*)    GFR calc non Af Amer 37 (*)    GFR calc Af Amer 43 (*)    All other components within normal limits  CBC WITH DIFFERENTIAL/PLATELET - Abnormal; Notable for the following:    Platelets 97 (*)    All other components within normal limits  PROTIME-INR - Abnormal; Notable for the following:    Prothrombin Time 21.5 (*)    All other components within normal limits  CBG MONITORING, ED - Abnormal; Notable for the following:     Glucose-Capillary 100 (*)    All other components within normal limits  URINE CULTURE  BRAIN NATRIURETIC PEPTIDE  I-STAT CG4 LACTIC ACID, ED  I-STAT TROPOININ, ED  I-STAT CG4 LACTIC ACID, ED    EKG  EKG Interpretation  Date/Time:  Wednesday March 23 2016 15:49:55 EST Ventricular Rate:  81 PR Interval:    QRS Duration: 88 QT Interval:  403 QTC Calculation: 468 R Axis:   36 Text Interpretation:  Normal sinus rhythm Baseline wander No significant change since last tracing Confirmed by Muriel Hannold MD, Lysbeth Galas [id number]) on 03/23/2016 5:05:31 PM       Radiology Dg Chest 2 View  Result Date: 03/23/2016 CLINICAL DATA:  Fever, aching and coughing. EXAM: CHEST  2 VIEW COMPARISON:  11/16/2010 FINDINGS: Heart size is normal. Mediastinal shadows are normal except for aortic calcification. There is central bronchial thickening but there is no infiltrate, collapse or effusion. No acute bone finding. IMPRESSION: Bronchitis pattern.  No consolidation or collapse. Electronically Signed   By: Nelson Chimes M.D.   On: 03/23/2016 18:58    Procedures Procedures (including critical care time)  Medications Ordered in ED Medications  oseltamivir (TAMIFLU) capsule 75 mg (not administered)  potassium chloride SA (K-DUR,KLOR-CON) CR tablet 40 mEq (not administered)  sodium chloride 0.9 % bolus 1,000 mL (0 mLs Intravenous Stopped 03/23/16 1816)  cefTRIAXone (ROCEPHIN) 1 g in dextrose 5 % 50 mL IVPB (0 g Intravenous Stopped 03/23/16 1848)     Initial Impression / Assessment and Plan / ED Course  I have reviewed the triage vital signs and the nursing notes.  Pertinent labs & imaging results that were available during my care of the patient were reviewed by me and considered in my medical decision making (see chart for details).     83 yo F with PMHx as above here with generalized fatigue, nausea, poor PO intake, cough, and failure to thrive. On arrival, pt HDS but dehydrated clinically, appears to feel  unwell. Lab work shows likely pre-renal AKI as well as +UTI. CXR pending. Of note, pt does have + flu exposure in husband as  well, who is being admitted in room next to pt. Suspect dehydration, AKI 2/2 flu and possible UTI. Will admit for ABX, IVF. Will empirically start tamiflu. Pt in agreement.  Final Clinical Impressions(s) / ED Diagnoses   Final diagnoses:  Dehydration  AKI (acute kidney injury) ([place])  Influenza-like illness  Acute cystitis without hematuria      Duffy Bruce, MD 03/24/16 [phone]

## 2016-03-23 NOTE — ED Notes (Signed)
Bedside reporting done with Jenel Lucks, RN.

## 2016-03-23 NOTE — ED Notes (Signed)
Bed: WA21 Expected date:  Expected time:  Means of arrival:  Comments: 

## 2016-03-23 NOTE — Progress Notes (Addendum)
PHARMACY NOTE:  ANTIMICROBIAL RENAL DOSAGE ADJUSTMENT  Current antimicrobial regimen includes a mismatch between antimicrobial dosage and estimated renal function.  As per policy approved by the Pharmacy & Therapeutics and Medical Executive Committees, the antimicrobial dosage will be adjusted accordingly.  Current antimicrobial dosage:  Tamiflu 75mg  PO BID  Indication: flu  Renal Function:  Estimated Creatinine Clearance: 29 mL/min (by C-G formula based on SCr of 1.31 mg/dL (H)). []      On intermittent HD, scheduled: []      On CRRT    Antimicrobial dosage has been changed to:  30mg  PO BID  Additional comments:  NCrCl =38 so used dosing for CrCl 30-58ml/min for now & continue to monitor renal function and further decrease dose if needed.   Thank you for allowing pharmacy to be a part of this patient's care.  Biagio Borg, [facility] 03/23/2016 9:11 PM

## 2016-03-23 NOTE — H&P (Signed)
History and Physical    Robin Harrell [id number] DOB: 1933-03-11 DOA: 03/23/2016  PCP: [name], MD  Patient coming from: Home.  Chief Complaint: Weakness.  HPI: Robin Harrell is a 83 y.o. female with history of factor V Leiden mutation with pulmonary embolism, hypertension, hypothyroidism had come to the ER to drop her husband of as her husband was sick. While waiting in the ER patient was found to be very weak and was taken for further exam. Patient was found to be febrile and chest x-ray was showing bronchitis pattern. Patient states over the last 3-4 days patient has been sick and not eating well and weak to walk. Denies any fall or loss of consciousness. Denies any chest pain. UA shows features concerning for UTI and patient's metabolic panel shows acute renal failure.  ED Course: Patient was given fluid bolus in the ER and started on ceftriaxone for UTI. Influenza PCR is pending.  Review of Systems: As per HPI, rest all negative.   Past Medical History:  Diagnosis Date  . Allergic rhinitis   . Cough   . Difficulty in swallowing    w/ occasional aspiration  . DVT (deep venous thrombosis) ([place])   . Dyslipidemia   . Factor V Leiden deficiency    lifelong coumadin  . GERD (gastroesophageal reflux disease)   . History of nuclear stress test 12/28/2007   lexiscan; low risk   . HTN (hypertension)   . PND (post-nasal drip)   . Pulmonary embolism ([place])   . PVC's (premature ventricular contractions)     Past Surgical History:  Procedure Laterality Date  . BUNIONECTOMY    . CHOLECYSTECTOMY  1973  . HAMMER TOE SURGERY    . history of sleep study  12/27/2007   AHI during total sleep 0.9/hr and REM 1.5/hr; RDI during total sleep6.0/hr and REM 6.1/hr  . 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 TRANSTHORACIC ECHOCARDIOGRAM  12/28/2007   borderline conc LVH with normal systolic function; MV mod thickened with mild MVP & mild MR     reports that she quit  smoking about 58 years ago. Her smoking use included Cigarettes. She quit after 1.00 year of use. She has never used smokeless tobacco. She reports that she does not drink alcohol or use drugs.  Allergies  Allergen Reactions  . Codeine Nausea Only  . Erythromycin Nausea And Vomiting    Family History  Problem Relation Age of Onset  . Coronary artery disease Maternal Grandfather     MI  . Coronary artery disease Paternal Grandfather     MI  . Throat cancer Paternal Grandfather   . Allergic rhinitis Sister     Prior to Admission medications   Medication Sig Start Date End Date Taking? Authorizing Provider  atorvastatin (LIPITOR) 10 MG tablet Take 10 mg by mouth daily at 6 PM.   Yes Historical Provider, MD  brimonidine (ALPHAGAN P) 0.1 % SOLN Place 1 drop into both eyes every 12 (twelve) hours.     Yes Historical Provider, MD  latanoprost (XALATAN) 0.005 % ophthalmic solution Place 1 drop into both eyes at bedtime.  10/08/10  Yes Historical Provider, MD  levothyroxine (SYNTHROID, LEVOTHROID) 25 MCG tablet Take 25 mcg by mouth daily before breakfast.  03/08/16  Yes Historical Provider, MD  losartan (COZAAR) 50 MG tablet TAKE 1 TABLET BY MOUTH ONCE DAILY 08/08/14  Yes Historical Provider, MD  metoprolol tartrate (LOPRESSOR) 25 MG tablet TAKE 1 TABLET BY MOUTH  TWICE DAILY 03/15/16  Yes Lorretta Harp, MD  multivitamin-iron-minerals-folic acid (CENTRUM) chewable tablet Chew 1 tablet by mouth daily.   Yes Historical Provider, MD  omeprazole (PRILOSEC) 20 MG capsule TAKE 1 CAPSULE(20 MG) BY MOUTH TWICE DAILY BEFORE A MEAL 01/20/16  Yes Collene Gobble, MD  warfarin (COUMADIN) 7.5 MG tablet Take 1/2 to 1 tablet by mouth daily as directed by coumadin clinic Patient taking differently: Take 3.75-7.5 mg by mouth daily. Take 0.5 tablet (3.75 mg) on MWF and Take 1 tablet (7.5 mg) on Sun, Tues, Thurs & Sat. 12/08/15  Yes Lorretta Harp, MD  cetirizine (ZYRTEC) 10 MG tablet Take 1 tablet (10 mg total) by  mouth daily. 03/15/16   Collene Gobble, MD  chlorpheniramine (CHLOR-TRIMETON) 4 MG tablet Take 4 mg by mouth 2 (two) times daily as needed for allergies.    Historical Provider, MD  ipratropium (ATROVENT) 0.03 % nasal spray Place 2 sprays into both nostrils 3 (three) times daily as needed for rhinitis. 12/17/14   Collene Gobble, MD    Physical Exam: Vitals:   03/23/16 1816 03/23/16 2013 03/23/16 2030 03/23/16 2101  BP: 151/78  112/58 (!) 128/52  Pulse: 83  90 79  Resp: 14  18 18   Temp: 99.3 F (37.4 C)   99.6 F (37.6 C)  TempSrc: Oral   Oral  SpO2: 100% 98% 96% 99%  Weight: 61.2 kg (135 lb)     Height: 5' 3.5" (1.613 m)         Constitutional: Moderately built and nourished. Vitals:   03/23/16 1816 03/23/16 2013 03/23/16 2030 03/23/16 2101  BP: 151/78  112/58 (!) 128/52  Pulse: 83  90 79  Resp: 14  18 18   Temp: 99.3 F (37.4 C)   99.6 F (37.6 C)  TempSrc: Oral   Oral  SpO2: 100% 98% 96% 99%  Weight: 61.2 kg (135 lb)     Height: 5' 3.5" (1.613 m)      Eyes: Anicteric no pallor. ENMT: No discharge from the ears eyes nose and mouth. Neck: No mass felt. No JVD appreciated. Respiratory: No rhonchi or crepitations. Cardiovascular: S1 and S2 heard no murmurs appreciated. Abdomen: Soft nontender bowel sounds present. No guarding or rigidity. Musculoskeletal: No edema. No joint effusion. Skin: No rash. Skin appears warm. Neurologic: Alert awake oriented to time place and person. Moves all extremities. Psychiatric: Appears normal. Normal affect.   Labs on Admission: I have personally reviewed following labs and imaging studies  CBC:  Recent Labs Lab 03/23/16 1651  WBC 4.0  NEUTROABS 2.4  HGB 13.4  HCT 39.3  MCV 89.9  PLT 97*   Basic Metabolic Panel:  Recent Labs Lab 03/23/16 1651  NA 135  K 3.0*  CL 99*  CO2 23  GLUCOSE 107*  BUN 24*  CREATININE 1.31*  CALCIUM 8.6*   GFR: Estimated Creatinine Clearance: 29 mL/min (by C-G formula based on SCr of 1.31  mg/dL (H)). Liver Function Tests:  Recent Labs Lab 03/23/16 1651  AST 61*  ALT 30  ALKPHOS 64  BILITOT 0.9  PROT 7.3  ALBUMIN 4.0   No results for input(s): LIPASE, AMYLASE in the last 168 hours. No results for input(s): AMMONIA in the last 168 hours. Coagulation Profile:  Recent Labs Lab 03/23/16 1651  INR 1.84   Cardiac Enzymes: No results for input(s): CKTOTAL, CKMB, CKMBINDEX, TROPONINI in the last 168 hours. BNP (last 3 results) No results for input(s): PROBNP in the last 8760  hours. HbA1C: No results for input(s): HGBA1C in the last 72 hours. CBG:  Recent Labs Lab 03/23/16 1651  GLUCAP 100*   Lipid Profile: No results for input(s): CHOL, HDL, LDLCALC, TRIG, CHOLHDL, LDLDIRECT in the last 72 hours. Thyroid Function Tests: No results for input(s): TSH, T4TOTAL, FREET4, T3FREE, THYROIDAB in the last 72 hours. Anemia Panel: No results for input(s): VITAMINB12, FOLATE, FERRITIN, TIBC, IRON, RETICCTPCT in the last 72 hours. Urine analysis:    Component Value Date/Time   COLORURINE YELLOW 03/23/2016 1650   APPEARANCEUR CLOUDY (A) 03/23/2016 1650   LABSPEC 1.017 03/23/2016 1650   PHURINE 5.0 03/23/2016 1650   GLUCOSEU NEGATIVE 03/23/2016 1650   HGBUR SMALL (A) 03/23/2016 1650   BILIRUBINUR NEGATIVE 03/23/2016 1650   KETONESUR 5 (A) 03/23/2016 1650   PROTEINUR 30 (A) 03/23/2016 1650   NITRITE NEGATIVE 03/23/2016 1650   LEUKOCYTESUR MODERATE (A) 03/23/2016 1650   Sepsis Labs: @LABRCNTIP (procalcitonin:4,lacticidven:4) )No results found for this or any previous visit (from the past 240 hour(s)).   Radiological Exams on Admission: Dg Chest 2 View  Result Date: 03/23/2016 CLINICAL DATA:  Fever, aching and coughing. EXAM: CHEST  2 VIEW COMPARISON:  11/16/2010 FINDINGS: Heart size is normal. Mediastinal shadows are normal except for aortic calcification. There is central bronchial thickening but there is no infiltrate, collapse or effusion. No acute bone finding.  IMPRESSION: Bronchitis pattern.  No consolidation or collapse. Electronically Signed   By: Nelson Chimes M.D.   On: 03/23/2016 18:58    EKG: Independently reviewed. Normal sinus rhythm.  Assessment/Plan Principal Problem:   Generalized weakness Active Problems:   Essential hypertension   Factor 5 Leiden mutation, heterozygous (HCC)   UTI (urinary tract infection)   ARF (acute renal failure) (HCC)   Weakness   AKI (acute kidney injury) (HCC)   Dehydration    1. Generalized weakness most likely from dehydration UTI and flu - patient is empirically started on Tamiflu, follow influenza PCR which is pending. Patient is started on ceftriaxone for UTI. Follow urine cultures. Get physical therapy consult. 2. Acute renal failure - hold ARB. Continue hydration follow metabolic panel. 3. Hypertension - continue beta blocker and patient is on when necessary IV hydralazine. Holding ARB due to renal failure. 4. Hypothyroidism on Synthroid. Check TSH. 5. Hyperlipidemia on statins. 6. History of PE and factor V Leiden mutation - Coumadin per pharmacy. 7. Thrombocytopenia - follow CBC.   DVT prophylaxis: Coumadin. Code Status: Full code.  Family Communication: Discussed with patient.  Disposition Plan: Home.  Consults called: Physical therapy.  Admission status: Observation.    Rise Patience MD [facility] Pager [phone].  If 7PM-7AM, please contact night-coverage [email or web address] Password [facility]  03/23/2016, 10:49 PM

## 2016-03-23 NOTE — Progress Notes (Addendum)
CSW spoke to the pt's sister Dawayne Cirri at ph: [phone] who lives in this area and who will be able to look after and visit with the pt and her husband who is in the emergency room and presenting with the same symptoms in [place].  CSW confirmed pt's daughters will be arriving this weekend to assist in caring for the pt and the pt's husband. CSW confirmed three daughters live in [place] and one other daughter elsewhere.  CSW is going to consult with the pt's EDP.  Pt confirmed the pt and the pt's spouse have been able to ambulate, as well as perform all ADL's without assistance prior to presenting with flu symptoms, per the pt's wife.  Pt's sister-in-law confirmed pt and pt's spouse are safe to discharge home prior to exhibiting weakness due to flu symptoms and once they have regained their strength.  6:16 PM CSW spoke to the EDP.  Pt is going to be admitted, as well as the pt's spouse.  Alphonse Guild. Ronel Rodeheaver, Latanya Presser, LCAS Clinical Social Worker Ph: [phone]

## 2016-03-23 NOTE — Clinical Social Work Note (Signed)
Clinical Social Work Assessment  Patient Details  Name: Robin Harrell MRN: [id number] Date of Birth: [birth year]  Date of referral:  03/23/16               Reason for consult:  Capacity                Permission sought to share information with:  Family Supports Permission granted to share information::  Yes, Verbal Permission Granted  Name::        Agency::     Relationship::     Contact Information:     Housing/Transportation Living arrangements for the past 2 months:  Single Family Home Source of Information:  Patient, Other (Comment Required) (Pt's sister) Patient Interpreter Needed:  None Criminal Activity/Legal Involvement Pertinent to Current Situation/Hospitalization:    Significant Relationships:  Siblings, Spouse Lives with:  Significant Other Do you feel safe going back to the place where you live?  Yes Need for family participation in patient care:  Yes (Comment) (Pt's sister)  Care giving concerns:  Pt's need to regain strength before returning home   Social Worker assessment / plan:  CSW met with pt and confirmed pt's plan to return to pt's home at discharge. Plan has been agreed upon with pt's sister who can care for pt at home once dsicharged.  Two of pt's daughters will arrive from [place] during [place] weekend to assist the pt and the pt's spouse in their home.  Pt and pt's spouse who is also being admitted have been living independently prior to discharge.    Employment status:  Retired Nurse, adult PT Recommendations:  Not assessed at this time   Information / Referral to community resources:    Pt and pt's spouse, as well as the pt's sister are appreciative and have thanked the CSW. Patient/Family's Understanding of and Emotional Response to Diagnosis, Current Treatment, and Prognosis: Still assessing    Emotional Assessment Appearance:  Appears stated age Attitude/Demeanor/Rapport:    Affect (typically observed):   Appropriate, Accepting, Stoic Orientation:  Oriented to Self, Oriented to Place, Oriented to  Time, Oriented to Situation Alcohol / Substance use:    Psych involvement (Current and /or in the community):  No (Comment)  Discharge Needs  Concerns to be addressed:  Basic Needs Readmission within the last 30 days:  No Current discharge risk:  None Barriers to Discharge:  No Barriers Identified   Claudine Mouton, LCSWA 03/23/2016, 6:37 PM

## 2016-03-23 NOTE — ED Triage Notes (Signed)
Patient has been having a low grade fever, coughing, and feeling week.

## 2016-03-24 DIAGNOSIS — E860 Dehydration: Secondary | ICD-10-CM | POA: Diagnosis not present

## 2016-03-24 DIAGNOSIS — E039 Hypothyroidism, unspecified: Secondary | ICD-10-CM | POA: Diagnosis present

## 2016-03-24 DIAGNOSIS — K219 Gastro-esophageal reflux disease without esophagitis: Secondary | ICD-10-CM | POA: Diagnosis present

## 2016-03-24 DIAGNOSIS — D6859 Other primary thrombophilia: Secondary | ICD-10-CM

## 2016-03-24 DIAGNOSIS — Z86711 Personal history of pulmonary embolism: Secondary | ICD-10-CM | POA: Diagnosis not present

## 2016-03-24 DIAGNOSIS — Z7901 Long term (current) use of anticoagulants: Secondary | ICD-10-CM | POA: Diagnosis not present

## 2016-03-24 DIAGNOSIS — R531 Weakness: Secondary | ICD-10-CM | POA: Diagnosis not present

## 2016-03-24 DIAGNOSIS — I10 Essential (primary) hypertension: Secondary | ICD-10-CM | POA: Diagnosis not present

## 2016-03-24 DIAGNOSIS — Z87891 Personal history of nicotine dependence: Secondary | ICD-10-CM | POA: Diagnosis not present

## 2016-03-24 DIAGNOSIS — N390 Urinary tract infection, site not specified: Secondary | ICD-10-CM

## 2016-03-24 DIAGNOSIS — E876 Hypokalemia: Secondary | ICD-10-CM | POA: Diagnosis present

## 2016-03-24 DIAGNOSIS — Z79899 Other long term (current) drug therapy: Secondary | ICD-10-CM | POA: Diagnosis not present

## 2016-03-24 DIAGNOSIS — J09X2 Influenza due to identified novel influenza A virus with other respiratory manifestations: Secondary | ICD-10-CM

## 2016-03-24 DIAGNOSIS — J40 Bronchitis, not specified as acute or chronic: Secondary | ICD-10-CM | POA: Diagnosis present

## 2016-03-24 DIAGNOSIS — Z9049 Acquired absence of other specified parts of digestive tract: Secondary | ICD-10-CM | POA: Diagnosis not present

## 2016-03-24 DIAGNOSIS — D6851 Activated protein C resistance: Secondary | ICD-10-CM

## 2016-03-24 DIAGNOSIS — Z86718 Personal history of other venous thrombosis and embolism: Secondary | ICD-10-CM | POA: Diagnosis not present

## 2016-03-24 DIAGNOSIS — N179 Acute kidney failure, unspecified: Secondary | ICD-10-CM | POA: Diagnosis not present

## 2016-03-24 DIAGNOSIS — Z885 Allergy status to narcotic agent status: Secondary | ICD-10-CM | POA: Diagnosis not present

## 2016-03-24 DIAGNOSIS — N3000 Acute cystitis without hematuria: Secondary | ICD-10-CM | POA: Diagnosis present

## 2016-03-24 DIAGNOSIS — Z881 Allergy status to other antibiotic agents status: Secondary | ICD-10-CM | POA: Diagnosis not present

## 2016-03-24 DIAGNOSIS — R5383 Other fatigue: Secondary | ICD-10-CM

## 2016-03-24 DIAGNOSIS — L89152 Pressure ulcer of sacral region, stage 2: Secondary | ICD-10-CM | POA: Diagnosis present

## 2016-03-24 DIAGNOSIS — D696 Thrombocytopenia, unspecified: Secondary | ICD-10-CM | POA: Diagnosis present

## 2016-03-24 DIAGNOSIS — R5381 Other malaise: Secondary | ICD-10-CM

## 2016-03-24 DIAGNOSIS — L8990 Pressure ulcer of unspecified site, unspecified stage: Secondary | ICD-10-CM | POA: Insufficient documentation

## 2016-03-24 DIAGNOSIS — E785 Hyperlipidemia, unspecified: Secondary | ICD-10-CM | POA: Diagnosis present

## 2016-03-24 DIAGNOSIS — Z8249 Family history of ischemic heart disease and other diseases of the circulatory system: Secondary | ICD-10-CM | POA: Diagnosis not present

## 2016-03-24 DIAGNOSIS — B9620 Unspecified Escherichia coli [E. coli] as the cause of diseases classified elsewhere: Secondary | ICD-10-CM | POA: Diagnosis present

## 2016-03-24 LAB — CBC
HEMATOCRIT: 32.5 % — AB (ref 36.0–46.0)
HEMOGLOBIN: 11.1 g/dL — AB (ref 12.0–15.0)
MCH: 30.8 pg (ref 26.0–34.0)
MCHC: 34.2 g/dL (ref 30.0–36.0)
MCV: 90.3 fL (ref 78.0–100.0)
Platelets: 92 10*3/uL — ABNORMAL LOW (ref 150–400)
RBC: 3.6 MIL/uL — ABNORMAL LOW (ref 3.87–5.11)
RDW: 13.2 % (ref 11.5–15.5)
WBC: 3 10*3/uL — ABNORMAL LOW (ref 4.0–10.5)

## 2016-03-24 LAB — BASIC METABOLIC PANEL
Anion gap: 7 (ref 5–15)
BUN: 19 mg/dL (ref 6–20)
CHLORIDE: 110 mmol/L (ref 101–111)
CO2: 21 mmol/L — AB (ref 22–32)
Calcium: 7.7 mg/dL — ABNORMAL LOW (ref 8.9–10.3)
Creatinine, Ser: 0.8 mg/dL (ref 0.44–1.00)
GFR calc Af Amer: >60 mL/min (ref >60)
GLUCOSE: 88 mg/dL (ref 65–99)
POTASSIUM: 4.3 mmol/L (ref 3.5–5.1)
Sodium: 138 mmol/L (ref 135–145)

## 2016-03-24 LAB — PROTIME-INR
INR: 2.27
Prothrombin Time: 25.4 seconds — ABNORMAL HIGH (ref 11.4–15.2)

## 2016-03-24 MED ORDER — CEFTRIAXONE SODIUM 1 G IJ SOLR
1.0000 g | INTRAMUSCULAR | Status: DC
  Administered 2016-03-24 – 2016-03-25 (×2): 1 g via INTRAVENOUS
  Filled 2016-03-24 (×2): qty 10

## 2016-03-24 MED ORDER — OSELTAMIVIR PHOSPHATE 30 MG PO CAPS
30.0000 mg | ORAL_CAPSULE | Freq: Two times a day (BID) | ORAL | Status: DC
  Administered 2016-03-24 – 2016-03-26 (×5): 30 mg via ORAL
  Filled 2016-03-24 (×5): qty 1

## 2016-03-24 MED ORDER — WARFARIN SODIUM 2.5 MG PO TABS
3.7500 mg | ORAL_TABLET | Freq: Once | ORAL | Status: AC
  Administered 2016-03-24: 3.75 mg via ORAL
  Filled 2016-03-24: qty 1.5

## 2016-03-24 MED ORDER — GUAIFENESIN-DM 100-10 MG/5ML PO SYRP: 5.0000 mL | ORAL_SOLUTION | ORAL | Status: DC | PRN

## 2016-03-24 NOTE — Evaluation (Signed)
Physical Therapy One Time Evaluation Patient Details Name: Robin Harrell MRN: [id number] DOB: May 29, 1933 Today's Date: 03/24/2016   History of Present Illness  83 y.o. female with history of factor V Leiden mutation with pulmonary embolism, hypertension, hypothyroidism and admitted for generalized weakness most likely from dehydration, UTI, and flu   Clinical Impression  Patient evaluated by Physical Therapy with no further acute PT needs identified. All education has been completed and the patient has no further questions.  Pt ambulated in hallway and SpO2 remained 93-98% during ambulation.  Pt reports her spouse is in the hospital with the flu as well however her daughters will be coming into town to assist at home upon d/c. See below for any follow-up Physical Therapy or equipment needs. PT is signing off. Thank you for this referral.     Follow Up Recommendations No PT follow up    Equipment Recommendations  None recommended by PT    Recommendations for Other Services       Precautions / Restrictions Precautions Precautions: None      Mobility  Bed Mobility Overal bed mobility: Needs Assistance Bed Mobility: Supine to Sit     Supine to sit: Supervision        Transfers Overall transfer level: Needs assistance Equipment used: None Transfers: Sit to/from Stand;Stand Pivot Transfers Sit to Stand: Min guard;Supervision Stand pivot transfers: Min guard;Supervision       General transfer comment: pt with incontinent loose bowel movement upon arrival, assisted to [facility] to perform hygiene and change gown, pt able to perform with setup supervision  Ambulation/Gait Ambulation/Gait assistance: Min guard Ambulation Distance (Feet): 160 Feet Assistive device: None Gait Pattern/deviations: Step-through pattern     General Gait Details: pt pushed dynamap, SpO2 93-98% room air, pt tolerated fairly well  Stairs            Wheelchair Mobility    Modified Rankin  (Stroke Patients Only)       Balance Overall balance assessment: No apparent balance deficits (not formally assessed)         Standing balance support: During functional activity;No upper extremity supported Standing balance-Leahy Scale: Good                               Pertinent Vitals/Pain Pain Assessment: No/denies pain    Home Living Family/patient expects to be discharged to:: Private residence Living Arrangements: Spouse/significant other   Type of Home: House Home Access: Stairs to enter   Technical brewer of Steps: 2 Home Layout: One level Home Equipment: None Additional Comments: pt states her daughter is coming from [place] to stay with her tomorrow and then another on Monday, spouse is in hospital also with flu per pt    Prior Function Level of Independence: Independent               Hand Dominance        Extremity/Trunk Assessment        Lower Extremity Assessment Lower Extremity Assessment: Generalized weakness       Communication   Communication: No difficulties  Cognition Arousal/Alertness: Awake/alert Behavior During Therapy: WFL for tasks assessed/performed Overall Cognitive Status: Within Functional Limits for tasks assessed                      General Comments      Exercises     Assessment/Plan    PT Assessment Patent does not need  any further PT services  PT Problem List            PT Treatment Interventions      PT Goals (Current goals can be found in the Care Plan section)  Acute Rehab PT Goals PT Goal Formulation: All assessment and education complete, DC therapy    Frequency     Barriers to discharge        Co-evaluation               End of Session   Activity Tolerance: Patient tolerated treatment well Patient left: in chair;with call bell/phone within reach      Functional Assessment Tool Used: clinical judgement Functional Limitation: Mobility: Walking and moving  around Mobility: Walking and Moving Around Current Status [id number]): At least 1 percent but less than 20 percent impaired, limited or restricted Mobility: Walking and Moving Around Goal Status [phone]): At least 1 percent but less than 20 percent impaired, limited or restricted Mobility: Walking and Moving Around Discharge Status [phone]): At least 1 percent but less than 20 percent impaired, limited or restricted    Time: 0937-1007 PT Time Calculation (min) (ACUTE ONLY): 30 min   Charges:   PT Evaluation $PT Eval Low Complexity: 1 Procedure     PT G Codes:   PT G-Codes **NOT FOR INPATIENT CLASS** Functional Assessment Tool Used: clinical judgement Functional Limitation: Mobility: Walking and moving around Mobility: Walking and Moving Around Current Status [id number]): At least 1 percent but less than 20 percent impaired, limited or restricted Mobility: Walking and Moving Around Goal Status [phone]): At least 1 percent but less than 20 percent impaired, limited or restricted Mobility: Walking and Moving Around Discharge Status [phone]): At least 1 percent but less than 20 percent impaired, limited or restricted    Haylynn Pha,KATHrine E 03/24/2016, 11:58 AM Carmelia Bake, PT, DPT 03/24/2016 Pager: [phone]

## 2016-03-24 NOTE — Progress Notes (Signed)
ANTICOAGULATION CONSULT NOTE - Initial Consult  Pharmacy Consult for Warfarin Indication: hx fo DVT, factor V Leiden   Allergies  Allergen Reactions  . Codeine Nausea Only  . Erythromycin Nausea And Vomiting    Patient Measurements: Height: 5' 3.5" (161.3 cm) Weight: 134 lb 11.2 oz (61.1 kg) IBW/kg (Calculated) : 53.55 Heparin Dosing Weight:   Vital Signs: Temp: 98.4 F (36.9 C) (02/01 0613) Temp Source: Oral (02/01 0613) BP: 108/57 (02/01 [id number]) Pulse Rate: 74 (02/01 0613)  Labs:  Recent Labs  03/23/16 1651 03/24/16 0454  HGB 13.4 11.1*  HCT 39.3 32.5*  PLT 97* 92*  LABPROT 21.5* 25.4*  INR 1.84 2.27  CREATININE 1.31* 0.80    Estimated Creatinine Clearance: 47.5 mL/min (by C-G formula based on SCr of 0.8 mg/dL).   Medical History: Past Medical History:  Diagnosis Date  . Allergic rhinitis   . Cough   . Difficulty in swallowing    w/ occasional aspiration  . DVT (deep venous thrombosis) ([place])   . Dyslipidemia   . Factor V Leiden deficiency    lifelong coumadin  . GERD (gastroesophageal reflux disease)   . History of nuclear stress test 12/28/2007   lexiscan; low risk   . HTN (hypertension)   . PND (post-nasal drip)   . Pulmonary embolism ([place])   . PVC's (premature ventricular contractions)     Medications:  Prescriptions Prior to Admission  Medication Sig Dispense Refill Last Dose  . atorvastatin (LIPITOR) 10 MG tablet Take 10 mg by mouth daily at 6 PM.   03/22/2016 at Unknown time  . brimonidine (ALPHAGAN P) 0.1 % SOLN Place 1 drop into both eyes every 12 (twelve) hours.     03/23/2016 at Unknown time  . latanoprost (XALATAN) 0.005 % ophthalmic solution Place 1 drop into both eyes at bedtime.    03/22/2016 at Unknown time  . levothyroxine (SYNTHROID, LEVOTHROID) 25 MCG tablet Take 25 mcg by mouth daily before breakfast.    03/22/2016 at Unknown time  . losartan (COZAAR) 50 MG tablet TAKE 1 TABLET BY MOUTH ONCE DAILY   03/22/2016 at Unknown time  .  metoprolol tartrate (LOPRESSOR) 25 MG tablet TAKE 1 TABLET BY MOUTH TWICE DAILY 60 tablet 11 03/22/2016 at 2345  . multivitamin-iron-minerals-folic acid (CENTRUM) chewable tablet Chew 1 tablet by mouth daily.   03/22/2016 at Unknown time  . omeprazole (PRILOSEC) 20 MG capsule TAKE 1 CAPSULE(20 MG) BY MOUTH TWICE DAILY BEFORE A MEAL 60 capsule 2 03/23/2016 at Unknown time  . warfarin (COUMADIN) 7.5 MG tablet Take 1/2 to 1 tablet by mouth daily as directed by coumadin clinic (Patient taking differently: Take 3.75-7.5 mg by mouth daily. Take 0.5 tablet (3.75 mg) on MWF and Take 1 tablet (7.5 mg) on Sun, Tues, Thurs & Sat.) 90 tablet 0 03/22/2016 at 2345  . cetirizine (ZYRTEC) 10 MG tablet Take 1 tablet (10 mg total) by mouth daily. 30 tablet 5 unknown  . chlorpheniramine (CHLOR-TRIMETON) 4 MG tablet Take 4 mg by mouth 2 (two) times daily as needed for allergies.   unknown  . ipratropium (ATROVENT) 0.03 % nasal spray Place 2 sprays into both nostrils 3 (three) times daily as needed for rhinitis. 30 mL 12 unknown   Scheduled:  . atorvastatin  10 mg Oral q1800  . brimonidine  1 drop Both Eyes Q12H  . cefTRIAXone (ROCEPHIN)  IV  1 g Intravenous Q24H  . feeding supplement (ENSURE ENLIVE)  237 mL Oral BID BM  . latanoprost  1  drop Both Eyes QHS  . levothyroxine  25 mcg Oral QAC breakfast  . loratadine  10 mg Oral Daily  . metoprolol tartrate  25 mg Oral BID  . oseltamivir  30 mg Oral BID  . pantoprazole  40 mg Oral Daily  . Warfarin - Pharmacist Dosing Inpatient   Does not apply q1800    Assessment: Patient with hx of DVT and factor V Leiden.  INR on admit < 2.  Pt states over the last 3-4 days has been sick and not eating well.   Home dose warfarin 3.75mg  on MWF and 7.5mg  on Sun, Tues, Thurs and Sat  03/24/2016 INR 2.27, therapeutic H/H low Plts low Diet: pt reports poor appetite/ oral intake for 3-4 days DI: none  Goal of Therapy:  INR 2-3   Plan:  -Warfarin 3.75mg  po x1 at 1800 (pt received  7.5mg  yesterday and due to decreased oral intake will use lower dose today) -daily INR  Dolly Rias RPh 03/24/2016, 8:23 AM Pager [phone]

## 2016-03-24 NOTE — Progress Notes (Signed)
[facility]  PROGRESS NOTE  Robin Harrell [id number] DOB: 1933/06/28 DOA: 03/23/2016 PCP: [name], MD   Brief HPI:    83 y.o. female with history of factor V Leiden mutation with pulmonary embolism, hypertension, hypothyroidism had come to the ER to drop her husband of as her husband was sick. While waiting in the ER patient was found to be very weak and was taken for further exam. Patient was found to be febrile and chest x-ray was showing bronchitis pattern. Patient states over the last 3-4 days patient has been sick and not eating well and weak to walk. Denies any fall or loss of consciousness. Denies any chest pain. UA shows features concerning for UTI and patient's metabolic panel shows acute renal failure.    Subjective   This morning patient feels better, denies shortness of breath or chest pain.   Assessment/Plan:     1. Upper respiratory infection/positive influenza A- patient currently on Tamiflu, continue supportive care 2. UTI-patient had abnormal UA, started on ceftriaxone. Follow urine culture results. 3. Acute kidney injury- resolved with IV fluids, today creatinine 0.80. ARB is on hold. 4. Hypertension- blood pressure stable, continue metoprolol 25 mg by mouth twice a day.ARB on hold due to above. 5. Hypothyroidism-continue Synthroid 6. Hyperlipidemia-continue statins 7. History of PE and factor V Leiden mutation- continue coumadin per pharmacy consultation 8. Thrombocytopenia- unclear cause, today platelets 92,000. Will check CBC in a.m.    DVT prophylaxis: Warfarin  Code Status: Full code  Family Communication: Discussed with patient's daughter on phone   Disposition Plan: Home once urine culture is resulted   Consultants:  None  Procedures:  None    Antibiotics:   Anti-infectives    Start     Dose/Rate Route Frequency Ordered Stop   03/24/16 1800  cefTRIAXone (ROCEPHIN) 1 g in dextrose 5 % 50 mL IVPB     1 g 100 mL/hr  over 30 Minutes Intravenous Every 24 hours 03/24/16 0404     03/24/16 1000  oseltamivir (TAMIFLU) capsule 30 mg  Status:  Discontinued     30 mg Oral 2 times daily 03/23/16 2109 03/23/16 2249   03/24/16 1000  oseltamivir (TAMIFLU) capsule 30 mg     30 mg Oral 2 times daily 03/24/16 0405 03/28/16 2159   03/23/16 1815  oseltamivir (TAMIFLU) capsule 75 mg  Status:  Discontinued     75 mg Oral 2 times daily 03/23/16 1803 03/23/16 2108   03/23/16 1745  cefTRIAXone (ROCEPHIN) 1 g in dextrose 5 % 50 mL IVPB     1 g 100 mL/hr over 30 Minutes Intravenous  Once 03/23/16 1736 03/23/16 1848       Objective   Vitals:   03/23/16 2013 03/23/16 2030 03/23/16 2101 03/24/16 0613  BP:  112/58 (!) 128/52 (!) 108/57  Pulse:  90 79 74  Resp:  18 18 18   Temp:   99.6 F (37.6 C) 98.4 F (36.9 C)  TempSrc:   Oral Oral  SpO2: 98% 96% 99% 95%  Weight:    61.1 kg (134 lb 11.2 oz)  Height:        Intake/Output Summary (Last 24 hours) at 03/24/16 1557 Last data filed at 03/24/16 1200  Gross per 24 hour  Intake          2173.75 ml  Output                0 ml  Net  2173.75 ml   Filed Weights   03/23/16 1816 03/24/16 0613  Weight: 61.2 kg (135 lb) 61.1 kg (134 lb 11.2 oz)     Physical Examination:  General exam: Appears calm and comfortable. Respiratory system: Clear to auscultation bilaterally Cardiovascular system:  RRR. No  murmurs, rubs, gallops. No pedal edema. GI system: Abdomen is nondistended, soft and nontender. No organomegaly.  Central nervous system. No focal neurological deficits. 5 x 5 power in all extremities. Skin: No rashes, lesions or ulcers. Psychiatry: Alert, oriented x 3.Judgement and insight appear normal. Affect normal.    Data Reviewed: I have personally reviewed following labs and imaging studies  CBG:  Recent Labs Lab 03/23/16 1651  GLUCAP 100*    CBC:  Recent Labs Lab 03/23/16 1651 03/24/16 0454  WBC 4.0 3.0*  NEUTROABS 2.4  --   HGB 13.4  11.1*  HCT 39.3 32.5*  MCV 89.9 90.3  PLT 97* 92*    Basic Metabolic Panel:  Recent Labs Lab 03/23/16 1651 03/24/16 0454  NA 135 138  K 3.0* 4.3  CL 99* 110  CO2 23 21*  GLUCOSE 107* 88  BUN 24* 19  CREATININE 1.31* 0.80  CALCIUM 8.6* 7.7*    No results found for this or any previous visit (from the past 240 hour(s)).   Liver Function Tests:  Recent Labs Lab 03/23/16 1651  AST 61*  ALT 30  ALKPHOS 64  BILITOT 0.9  PROT 7.3  ALBUMIN 4.0   No results for input(s): LIPASE, AMYLASE in the last 168 hours. No results for input(s): AMMONIA in the last 168 hours.  Cardiac Enzymes: No results for input(s): CKTOTAL, CKMB, CKMBINDEX, TROPONINI in the last 168 hours. BNP (last 3 results)  Recent Labs  03/23/16 1651  BNP 46.9    ProBNP (last 3 results) No results for input(s): PROBNP in the last 8760 hours.    Studies: Dg Chest 2 View  Result Date: 03/23/2016 CLINICAL DATA:  Fever, aching and coughing. EXAM: CHEST  2 VIEW COMPARISON:  11/16/2010 FINDINGS: Heart size is normal. Mediastinal shadows are normal except for aortic calcification. There is central bronchial thickening but there is no infiltrate, collapse or effusion. No acute bone finding. IMPRESSION: Bronchitis pattern.  No consolidation or collapse. Electronically Signed   By: Nelson Chimes M.D.   On: 03/23/2016 18:58    Scheduled Meds: . atorvastatin  10 mg Oral q1800  . brimonidine  1 drop Both Eyes Q12H  . cefTRIAXone (ROCEPHIN)  IV  1 g Intravenous Q24H  . feeding supplement (ENSURE ENLIVE)  237 mL Oral BID BM  . latanoprost  1 drop Both Eyes QHS  . levothyroxine  25 mcg Oral QAC breakfast  . loratadine  10 mg Oral Daily  . metoprolol tartrate  25 mg Oral BID  . oseltamivir  30 mg Oral BID  . pantoprazole  40 mg Oral Daily  . warfarin  3.75 mg Oral ONCE-1800  . Warfarin - Pharmacist Dosing Inpatient   Does not apply q1800      Time spent: 25 min  [facility] Pager [phone]. If 7PM-7AM, please contact night-coverage at [email or web address], Office  [phone]  password [email or web address] 03/24/2016, 3:57 PM  LOS: 0 days

## 2016-03-24 NOTE — Progress Notes (Signed)
ANTICOAGULATION CONSULT NOTE - Initial Consult  Pharmacy Consult for Warfarin Indication: hx fo DVT, factor V Leiden   Allergies  Allergen Reactions  . Codeine Nausea Only  . Erythromycin Nausea And Vomiting    Patient Measurements: Height: 5' 3.5" (161.3 cm) Weight: 135 lb (61.2 kg) IBW/kg (Calculated) : 53.55 Heparin Dosing Weight:   Vital Signs: Temp: 99.6 F (37.6 C) (01/31 2101) Temp Source: Oral (01/31 2101) BP: 128/52 (01/31 2101) Pulse Rate: 79 (01/31 2101)  Labs:  Recent Labs  03/23/16 1651  HGB 13.4  HCT 39.3  PLT 97*  LABPROT 21.5*  INR 1.84  CREATININE 1.31*    Estimated Creatinine Clearance: 29 mL/min (by C-G formula based on SCr of 1.31 mg/dL (H)).   Medical History: Past Medical History:  Diagnosis Date  . Allergic rhinitis   . Cough   . Difficulty in swallowing    w/ occasional aspiration  . DVT (deep venous thrombosis) ([place])   . Dyslipidemia   . Factor V Leiden deficiency    lifelong coumadin  . GERD (gastroesophageal reflux disease)   . History of nuclear stress test 12/28/2007   lexiscan; low risk   . HTN (hypertension)   . PND (post-nasal drip)   . Pulmonary embolism ([place])   . PVC's (premature ventricular contractions)     Medications:  Prescriptions Prior to Admission  Medication Sig Dispense Refill Last Dose  . atorvastatin (LIPITOR) 10 MG tablet Take 10 mg by mouth daily at 6 PM.   03/22/2016 at Unknown time  . brimonidine (ALPHAGAN P) 0.1 % SOLN Place 1 drop into both eyes every 12 (twelve) hours.     03/23/2016 at Unknown time  . latanoprost (XALATAN) 0.005 % ophthalmic solution Place 1 drop into both eyes at bedtime.    03/22/2016 at Unknown time  . levothyroxine (SYNTHROID, LEVOTHROID) 25 MCG tablet Take 25 mcg by mouth daily before breakfast.    03/22/2016 at Unknown time  . losartan (COZAAR) 50 MG tablet TAKE 1 TABLET BY MOUTH ONCE DAILY   03/22/2016 at Unknown time  . metoprolol tartrate (LOPRESSOR) 25 MG tablet TAKE 1 TABLET  BY MOUTH TWICE DAILY 60 tablet 11 03/22/2016 at 2345  . multivitamin-iron-minerals-folic acid (CENTRUM) chewable tablet Chew 1 tablet by mouth daily.   03/22/2016 at Unknown time  . omeprazole (PRILOSEC) 20 MG capsule TAKE 1 CAPSULE(20 MG) BY MOUTH TWICE DAILY BEFORE A MEAL 60 capsule 2 03/23/2016 at Unknown time  . warfarin (COUMADIN) 7.5 MG tablet Take 1/2 to 1 tablet by mouth daily as directed by coumadin clinic (Patient taking differently: Take 3.75-7.5 mg by mouth daily. Take 0.5 tablet (3.75 mg) on MWF and Take 1 tablet (7.5 mg) on Sun, Tues, Thurs & Sat.) 90 tablet 0 03/22/2016 at 2345  . cetirizine (ZYRTEC) 10 MG tablet Take 1 tablet (10 mg total) by mouth daily. 30 tablet 5 unknown  . chlorpheniramine (CHLOR-TRIMETON) 4 MG tablet Take 4 mg by mouth 2 (two) times daily as needed for allergies.   unknown  . ipratropium (ATROVENT) 0.03 % nasal spray Place 2 sprays into both nostrils 3 (three) times daily as needed for rhinitis. 30 mL 12 unknown   Scheduled:  . atorvastatin  10 mg Oral q1800  . brimonidine  1 drop Both Eyes Q12H  . cefTRIAXone (ROCEPHIN)  IV  1 g Intravenous Q24H  . feeding supplement (ENSURE ENLIVE)  237 mL Oral BID BM  . latanoprost  1 drop Both Eyes QHS  . levothyroxine  25  mcg Oral QAC breakfast  . loratadine  10 mg Oral Daily  . metoprolol tartrate  25 mg Oral BID  . oseltamivir  30 mg Oral BID  . pantoprazole  40 mg Oral Daily  . Warfarin - Pharmacist Dosing Inpatient   Does not apply q1800    Assessment: Patient with hx of DVT and factor V Leiden.  INR on admit < 2.    Goal of Therapy:  INR 2-3    Plan:  Warfarin 7.5mg  PO x1 (given) Daily INR  Nani Skillern Crowford 03/24/2016,4:06 AM

## 2016-03-24 NOTE — Care Management Obs Status (Signed)
[place] NOTIFICATION   Patient Details  Name: Robin Harrell MRN: [id number] Date of Birth: 1933/08/13   Medicare Observation Status Notification Given:  Yes    [name], RN 03/24/2016, 12:49 PM

## 2016-03-24 NOTE — Progress Notes (Addendum)
Initial Nutrition Assessment  DOCUMENTATION CODES:   Not applicable  INTERVENTION:  Ensure Enlive po BID, each supplement provides 350 kcal and 20 grams of protein  snacks  NUTRITION DIAGNOSIS:   Inadequate oral intake related to acute illness, poor appetite, nausea as evidenced by per patient/family report, mild depletion of muscle mass.  GOAL:   Patient will meet greater than or equal to 90% of their needs  MONITOR:   PO intake, Supplement acceptance, Weight trends, Skin  REASON FOR ASSESSMENT:   Malnutrition Screening Tool    ASSESSMENT:   83 y.o. female with history of factor V Leiden mutation with pulmonary embolism, hypertension, hypothyroidism had come to the ER to drop her husband of as her husband was sick. While waiting in the ER patient was found to be very weak and was taken for further exam. Patient was found to be febrile and chest x-ray was showing bronchitis pattern. Patient states over the last 3-4 days patient has been sick and not eating well and weak to walk. Pt admitted for ARF, UTI, flu   Met with pt in room today. Pt reports poor appetite and oral intake for 3-4 days pta. Pt having nausea and diarrhea pta. Pt reports that she is still having diarrhea and coughing but denies any N/V today. Per chart, pt is weight stable. Pt reports improved appetite today. Pt asking for lunch while RD in room. RD gave pt a menu and showed pt how to call in her orders.   Medications reviewed and include: ceftriaxone, synthroid, protonix, warfarin   Labs reviewed: C02 21(L), Ca 7.7(L), AST 61(H) WBC- 3.0(L)  Nutrition-Focused physical exam completed. Findings are no fat depletion, mild muscle depletion, and no edema.   Diet Order:  Diet Heart Room service appropriate? Yes; Fluid consistency: Thin  Skin:  Wound (see comment) (Stage II coccyx)  Last BM:  1/30  Height:   Ht Readings from Last 1 Encounters:  03/23/16 5' 3.5" (1.613 m)    Weight:   Wt Readings  from Last 1 Encounters:  03/24/16 134 lb 11.2 oz (61.1 kg)    Ideal Body Weight:  53.6 kg  BMI:  Body mass index is 23.49 kg/m.  Estimated Nutritional Needs:   Kcal:  1500-1700kcal/day   Protein:  67-79g/day   Fluid:  >1.5L/day   EDUCATION NEEDS:   No education needs identified at this time  Koleen Distance, RD, LDN Pager #[phone]

## 2016-03-24 NOTE — Care Management Note (Signed)
Case Management Note  Patient Details  Name: Robin Harrell MRN: [id number] Date of Birth: [birth year]  Subjective/Objective:  83 y/o f admitted w/UTI. From home. PT cons-await recc.                  Action/Plan:d/c plan home.   Expected Discharge Date:   (unknown)               Expected Discharge Plan:  Home/Self Care  In-House Referral:     Discharge planning Services  CM Consult  Post Acute Care Choice:    Choice offered to:     DME Arranged:    DME Agency:     HH Arranged:    HH Agency:     Status of Service:  In process, will continue to follow  If discussed at Long Length of Stay Meetings, dates discussed:    Additional Comments:  Dessa Phi, RN 03/24/2016, 11:53 AM

## 2016-03-24 NOTE — Progress Notes (Signed)
PHARMACY NOTE -  Dr. Hal Hope  Pharmacy has been assisting with dosing of Ceftriaxone for UTI. Dosage remains stable at Ceftriaxone 1gm iv q24hr  and need for further dosage adjustment appears unlikely at present.    Will sign off at this time.  Please reconsult if a change in clinical status warrants re-evaluation of dosage.

## 2016-03-25 DIAGNOSIS — E860 Dehydration: Secondary | ICD-10-CM

## 2016-03-25 LAB — BASIC METABOLIC PANEL
ANION GAP: 7 (ref 5–15)
BUN: 14 mg/dL (ref 6–20)
CALCIUM: 8.1 mg/dL — AB (ref 8.9–10.3)
CHLORIDE: 107 mmol/L (ref 101–111)
CO2: 23 mmol/L (ref 22–32)
Creatinine, Ser: 0.91 mg/dL (ref 0.44–1.00)
GFR calc non Af Amer: 58 mL/min — ABNORMAL LOW (ref >60)
Glucose, Bld: 99 mg/dL (ref 65–99)
Potassium: 3.9 mmol/L (ref 3.5–5.1)
Sodium: 137 mmol/L (ref 135–145)

## 2016-03-25 LAB — PROTIME-INR
INR: 2.63
Prothrombin Time: 28.6 seconds — ABNORMAL HIGH (ref 11.4–15.2)

## 2016-03-25 LAB — CBC
HEMATOCRIT: 30.5 % — AB (ref 36.0–46.0)
Hemoglobin: 10.4 g/dL — ABNORMAL LOW (ref 12.0–15.0)
MCH: 31 pg (ref 26.0–34.0)
MCHC: 34.1 g/dL (ref 30.0–36.0)
MCV: 91 fL (ref 78.0–100.0)
Platelets: 95 10*3/uL — ABNORMAL LOW (ref 150–400)
RBC: 3.35 MIL/uL — ABNORMAL LOW (ref 3.87–5.11)
RDW: 13.6 % (ref 11.5–15.5)
WBC: 2.7 10*3/uL — ABNORMAL LOW (ref 4.0–10.5)

## 2016-03-25 MED ORDER — WARFARIN SODIUM 2.5 MG PO TABS
2.5000 mg | ORAL_TABLET | Freq: Once | ORAL | Status: AC
  Administered 2016-03-25: 2.5 mg via ORAL
  Filled 2016-03-25: qty 1

## 2016-03-25 MED ORDER — DIPHENHYDRAMINE HCL 25 MG PO CAPS
25.0000 mg | ORAL_CAPSULE | Freq: Once | ORAL | Status: AC
  Administered 2016-03-25: 25 mg via ORAL
  Filled 2016-03-25: qty 1

## 2016-03-25 NOTE — Progress Notes (Signed)
[facility]  PROGRESS NOTE  Robin Harrell [id number] DOB: June 08, 1933 DOA: 03/23/2016 PCP: [name], MD   Brief HPI:    83 y.o. female with history of factor V Leiden mutation with pulmonary embolism, hypertension, hypothyroidism had come to the ER to drop her husband of as her husband was sick. While waiting in the ER patient was found to be very weak and was taken for further exam. Patient was found to be febrile and chest x-ray was showing bronchitis pattern. Patient states over the last 3-4 days patient has been sick and not eating well and weak to walk. Denies any fall or loss of consciousness. Denies any chest pain. UA shows features concerning for UTI and patient's metabolic panel shows acute renal failure.    Subjective   This morning patient feels better, denies shortness of breath or chest pain.   Assessment/Plan:     1. Upper respiratory infection/positive influenza A- patient currently on Tamiflu, continue supportive care 2. UTI-patient had abnormal UA, started on ceftriaxone. Urine culture growing E coli, > 100,000 colonies. Final sensitivities are pending. 3. Acute kidney injury- resolved with IV fluids, today creatinine 0.80. ARB is on hold. 4. Hypertension- blood pressure stable, continue metoprolol 25 mg by mouth twice a day.ARB on hold due to above. 5. Hypothyroidism-continue Synthroid 6. Hyperlipidemia-continue statins 7. History of PE and factor V Leiden mutation- continue coumadin per pharmacy consultation. INR is 2.63. 8. Thrombocytopenia- unclear cause, today platelets 95,000.     DVT prophylaxis: Warfarin  Code Status: Full code  Family Communication: Discussed with patient's daughter on phone   Disposition Plan: Home once urine culture is resulted   Consultants:  None  Procedures:  None    Antibiotics:   Anti-infectives    Start     Dose/Rate Route Frequency Ordered Stop   03/24/16 1800  cefTRIAXone (ROCEPHIN) 1 g in  dextrose 5 % 50 mL IVPB     1 g 100 mL/hr over 30 Minutes Intravenous Every 24 hours 03/24/16 0404     03/24/16 1000  oseltamivir (TAMIFLU) capsule 30 mg  Status:  Discontinued     30 mg Oral 2 times daily 03/23/16 2109 03/23/16 2249   03/24/16 1000  oseltamivir (TAMIFLU) capsule 30 mg     30 mg Oral 2 times daily 03/24/16 0405 03/28/16 2159   03/23/16 1815  oseltamivir (TAMIFLU) capsule 75 mg  Status:  Discontinued     75 mg Oral 2 times daily 03/23/16 1803 03/23/16 2108   03/23/16 1745  cefTRIAXone (ROCEPHIN) 1 g in dextrose 5 % 50 mL IVPB     1 g 100 mL/hr over 30 Minutes Intravenous  Once 03/23/16 1736 03/23/16 1848       Objective   Vitals:   03/24/16 1500 03/24/16 2154 03/25/16 0643 03/25/16 1534  BP: 118/62 134/63 135/61 (!) 106/55  Pulse: 82 76 71 63  Resp: 18 18 18 19   Temp: 98.6 F (37 C) 98.6 F (37 C) 97.8 F (36.6 C) 98.2 F (36.8 C)  TempSrc: Oral Oral Oral Oral  SpO2: 97% 96% 100% 97%  Weight:      Height:        Intake/Output Summary (Last 24 hours) at 03/25/16 1703 Last data filed at 03/25/16 1550  Gross per 24 hour  Intake              650 ml  Output              750 ml  Net             -100 ml   Filed Weights   03/23/16 1816 03/24/16 0613  Weight: 61.2 kg (135 lb) 61.1 kg (134 lb 11.2 oz)     Physical Examination:  General exam: Appears calm and comfortable. Respiratory system: Clear to auscultation bilaterally Cardiovascular system:  RRR. No  murmurs, rubs, gallops. No pedal edema. GI system: Abdomen is nondistended, soft and nontender. No organomegaly.  Central nervous system. No focal neurological deficits. 5 x 5 power in all extremities. Skin: No rashes, lesions or ulcers. Psychiatry: Alert, oriented x 3.Judgement and insight appear normal. Affect normal.    Data Reviewed: I have personally reviewed following labs and imaging studies  CBG:  Recent Labs Lab 03/23/16 1651  GLUCAP 100*    CBC:  Recent Labs Lab 03/23/16 1651  03/24/16 0454 03/25/16 0439  WBC 4.0 3.0* 2.7*  NEUTROABS 2.4  --   --   HGB 13.4 11.1* 10.4*  HCT 39.3 32.5* 30.5*  MCV 89.9 90.3 91.0  PLT 97* 92* 95*    Basic Metabolic Panel:  Recent Labs Lab 03/23/16 1651 03/24/16 0454 03/25/16 0439  NA 135 138 137  K 3.0* 4.3 3.9  CL 99* 110 107  CO2 23 21* 23  GLUCOSE 107* 88 99  BUN 24* 19 14  CREATININE 1.31* 0.80 0.91  CALCIUM 8.6* 7.7* 8.1*    Recent Results (from the past 240 hour(s))  Urine culture     Status: Abnormal (Preliminary result)   Collection Time: 03/23/16  4:50 PM  Result Value Ref Range Status   Specimen Description URINE, CATHETERIZED  Final   Special Requests NONE  Final   Culture (A)  Final    >=100,000 COLONIES/mL ESCHERICHIA COLI SUSCEPTIBILITIES TO FOLLOW Performed at [facility], [place] [address] [place], [place] [address]    Report Status PENDING  Incomplete     Liver Function Tests:  Recent Labs Lab 03/23/16 1651  AST 61*  ALT 30  ALKPHOS 64  BILITOT 0.9  PROT 7.3  ALBUMIN 4.0   No results for input(s): LIPASE, AMYLASE in the last 168 hours. No results for input(s): AMMONIA in the last 168 hours.  Cardiac Enzymes: No results for input(s): CKTOTAL, CKMB, CKMBINDEX, TROPONINI in the last 168 hours. BNP (last 3 results)  Recent Labs  03/23/16 1651  BNP 46.9    ProBNP (last 3 results) No results for input(s): PROBNP in the last 8760 hours.    Studies: Dg Chest 2 View  Result Date: 03/23/2016 CLINICAL DATA:  Fever, aching and coughing. EXAM: CHEST  2 VIEW COMPARISON:  11/16/2010 FINDINGS: Heart size is normal. Mediastinal shadows are normal except for aortic calcification. There is central bronchial thickening but there is no infiltrate, collapse or effusion. No acute bone finding. IMPRESSION: Bronchitis pattern.  No consolidation or collapse. Electronically Signed   By: Nelson Chimes M.D.   On: 03/23/2016 18:58    Scheduled Meds: . atorvastatin  10 mg Oral q1800  .  brimonidine  1 drop Both Eyes Q12H  . cefTRIAXone (ROCEPHIN)  IV  1 g Intravenous Q24H  . feeding supplement (ENSURE ENLIVE)  237 mL Oral BID BM  . latanoprost  1 drop Both Eyes QHS  . levothyroxine  25 mcg Oral QAC breakfast  . loratadine  10 mg Oral Daily  . metoprolol tartrate  25 mg Oral BID  . oseltamivir  30 mg Oral BID  . pantoprazole  40 mg  Oral Daily  . warfarin  2.5 mg Oral ONCE-1800  . Warfarin - Pharmacist Dosing Inpatient   Does not apply q1800      Time spent: 25 min  [facility] Pager [phone]. If 7PM-7AM, please contact night-coverage at [email or web address], Office  [phone]  password [email or web address] 03/25/2016, 5:03 PM  LOS: 1 day

## 2016-03-25 NOTE — Progress Notes (Signed)
Robin Harrell for Warfarin Indication: hx fo DVT, factor V Leiden   Allergies  Allergen Reactions  . Codeine Nausea Only  . Erythromycin Nausea And Vomiting    Patient Measurements: Height: 5' 3.5" (161.3 cm) Weight: 134 lb 11.2 oz (61.1 kg) IBW/kg (Calculated) : 53.55 Heparin Dosing Weight:   Vital Signs: Temp: 97.8 F (36.6 C) (02/02 0643) Temp Source: Oral (02/02 0643) BP: 135/61 (02/02 0643) Pulse Rate: 71 (02/02 0643)  Labs:  Recent Labs  03/23/16 1651 03/24/16 0454 03/25/16 0439  HGB 13.4 11.1* 10.4*  HCT 39.3 32.5* 30.5*  PLT 97* 92* 95*  LABPROT 21.5* 25.4* 28.6*  INR 1.84 2.27 2.63  CREATININE 1.31* 0.80 0.91    Estimated Creatinine Clearance: 41.7 mL/min (by C-G formula based on SCr of 0.91 mg/dL).   Medical History: Past Medical History:  Diagnosis Date  . Allergic rhinitis   . Cough   . Difficulty in swallowing    w/ occasional aspiration  . DVT (deep venous thrombosis) ([place])   . Dyslipidemia   . Factor V Leiden deficiency    lifelong coumadin  . GERD (gastroesophageal reflux disease)   . History of nuclear stress test 12/28/2007   lexiscan; low risk   . HTN (hypertension)   . PND (post-nasal drip)   . Pulmonary embolism ([place])   . PVC's (premature ventricular contractions)     Medications:  Prescriptions Prior to Admission  Medication Sig Dispense Refill Last Dose  . atorvastatin (LIPITOR) 10 MG tablet Take 10 mg by mouth daily at 6 PM.   03/22/2016 at Unknown time  . brimonidine (ALPHAGAN P) 0.1 % SOLN Place 1 drop into both eyes every 12 (twelve) hours.     03/23/2016 at Unknown time  . latanoprost (XALATAN) 0.005 % ophthalmic solution Place 1 drop into both eyes at bedtime.    03/22/2016 at Unknown time  . levothyroxine (SYNTHROID, LEVOTHROID) 25 MCG tablet Take 25 mcg by mouth daily before breakfast.    03/22/2016 at Unknown time  . losartan (COZAAR) 50 MG tablet TAKE 1 TABLET BY MOUTH ONCE DAILY    03/22/2016 at Unknown time  . metoprolol tartrate (LOPRESSOR) 25 MG tablet TAKE 1 TABLET BY MOUTH TWICE DAILY 60 tablet 11 03/22/2016 at 2345  . multivitamin-iron-minerals-folic acid (CENTRUM) chewable tablet Chew 1 tablet by mouth daily.   03/22/2016 at Unknown time  . omeprazole (PRILOSEC) 20 MG capsule TAKE 1 CAPSULE(20 MG) BY MOUTH TWICE DAILY BEFORE A MEAL 60 capsule 2 03/23/2016 at Unknown time  . warfarin (COUMADIN) 7.5 MG tablet Take 1/2 to 1 tablet by mouth daily as directed by coumadin clinic (Patient taking differently: Take 3.75-7.5 mg by mouth daily. Take 0.5 tablet (3.75 mg) on MWF and Take 1 tablet (7.5 mg) on Sun, Tues, Thurs & Sat.) 90 tablet 0 03/22/2016 at 2345  . cetirizine (ZYRTEC) 10 MG tablet Take 1 tablet (10 mg total) by mouth daily. 30 tablet 5 unknown  . chlorpheniramine (CHLOR-TRIMETON) 4 MG tablet Take 4 mg by mouth 2 (two) times daily as needed for allergies.   unknown  . ipratropium (ATROVENT) 0.03 % nasal spray Place 2 sprays into both nostrils 3 (three) times daily as needed for rhinitis. 30 mL 12 unknown   Scheduled:  . atorvastatin  10 mg Oral q1800  . brimonidine  1 drop Both Eyes Q12H  . cefTRIAXone (ROCEPHIN)  IV  1 g Intravenous Q24H  . feeding supplement (ENSURE ENLIVE)  237 mL Oral BID BM  .  latanoprost  1 drop Both Eyes QHS  . levothyroxine  25 mcg Oral QAC breakfast  . loratadine  10 mg Oral Daily  . metoprolol tartrate  25 mg Oral BID  . oseltamivir  30 mg Oral BID  . pantoprazole  40 mg Oral Daily  . Warfarin - Pharmacist Dosing Inpatient   Does not apply q1800    Assessment: Patient with hx of DVT and factor V Leiden.  INR on admit < 2.  Pt states over the last 3-4 days has been sick and not eating well.   Home dose warfarin 3.75mg  on MWF and 7.5mg  on Sun, Tues, Thurs and Sat  03/25/2016  INR 2.63, therapeutic and rising  Hgb 10.4, decreasing  Plts low but stable  Diet: pt reports poor appetite  DI: none  Goal of Therapy:  INR 2-3    Plan:   Decrease Warfarin 2.5mg  po x1 at 1800 since INR trending up and PO intake remains poor  Daily PT/INR  Peggyann Juba, PharmD, BCPS Pager: [phone] 03/25/2016, 8:26 AM

## 2016-03-26 LAB — URINE CULTURE: Culture: >=100000 — AB

## 2016-03-26 LAB — PROTIME-INR
INR: 2.57
Prothrombin Time: 28.1 seconds — ABNORMAL HIGH (ref 11.4–15.2)

## 2016-03-26 MED ORDER — CEPHALEXIN 500 MG PO CAPS: 500.0000 mg | ORAL_CAPSULE | Freq: Two times a day (BID) | ORAL | 0 refills | Status: DC

## 2016-03-26 MED ORDER — OSELTAMIVIR PHOSPHATE 30 MG PO CAPS: 30.0000 mg | ORAL_CAPSULE | Freq: Two times a day (BID) | ORAL | 0 refills | Status: DC

## 2016-03-26 NOTE — Discharge Summary (Signed)
Physician Discharge Summary  Robin Harrell [id number] DOB: 07/07/1933 DOA: 03/23/2016  PCP: [name], MD  Admit date: 03/23/2016 Discharge date: 03/26/2016  Time spent: 25* minutes  Recommendations for Outpatient Follow-up:  1. Follow up PCP in 2 weeks 2. Outpatient work up for thrombocytopenia  Discharge Diagnoses:  Principal Problem:   Generalized weakness Active Problems:   Essential hypertension   Factor 5 Leiden mutation, heterozygous ([place])   UTI (urinary tract infection)   ARF (acute renal failure) ([facility])   Weakness   AKI (acute kidney injury) ([place])   Dehydration   Pressure injury of skin   Influenza due to identified novel influenza A virus with other respiratory manifestations   Discharge Condition: Stable  Diet recommendation: Low salt diet  Filed Weights   03/23/16 1816 03/24/16 0613 03/26/16 0526  Weight: 61.2 kg (135 lb) 61.1 kg (134 lb 11.2 oz) 60.6 kg (133 lb 8 oz)    History of present illness:  83 y.o. female with history of factor V Leiden mutation with pulmonary embolism, hypertension, hypothyroidism had come to the ER to drop her husband of as her husband was sick. While waiting in the ER patient was found to be very weak and was taken for further exam. Patient was found to be febrile and chest x-ray was showing bronchitis pattern. Patient states over the last 3-4 days patient has been sick and not eating well and weak to walk. Denies any fall or loss of consciousness. Denies any chest pain. UA shows features concerning for UTI and patient's metabolic panel shows acute renal failure.  Hospital Course:  1. Upper respiratory infection/positive influenza A- patient currently on Tamiflu, will discharge home on Tamiflu for 4 more doses. 2. UTI-patient had abnormal UA, started on ceftriaxone. Urine culture growing E coli, > 100,000 colonies. Final sensitivities showed E coli sensitive to cefazolin, will discharge home on Keflex for 5 more  days. 3. Acute kidney injury- resolved with IV fluids, today creatinine 0.80.  4. Hypertension- blood pressure stable, continue metoprolol 25 mg by mouth twice a day, will restart Cozaar at home Hypothyroidism-continue Synthroid 5. Hyperlipidemia-continue statins 6. History of PE and factor V Leiden mutation- continue coumadin per pharmacy consultation. INR is 2.63. 7. Thrombocytopenia- unclear cause, today platelets 95,000.  Follow up PCP as outpatient  Procedures:  None  Consultations:  None   Discharge Exam: Vitals:   03/25/16 2119 03/26/16 0526  BP: 126/89 133/62  Pulse: 84 64  Resp: 18 20  Temp: 98.4 F (36.9 C) 97.7 F (36.5 C)    General: Appears in no acute distress Cardiovascular: RRR, S1S2 Respiratory: Clear bilaterally  Discharge Instructions   Discharge Instructions    Diet - low sodium heart healthy    Complete by:  As directed    Increase activity slowly    Complete by:  As directed      Current Discharge Medication List    START taking these medications   Details  cephALEXin (KEFLEX) 500 MG capsule Take 1 capsule (500 mg total) by mouth 2 (two) times daily. Qty: 10 capsule, Refills: 0    oseltamivir (TAMIFLU) 30 MG capsule Take 1 capsule (30 mg total) by mouth 2 (two) times daily. Qty: 4 capsule, Refills: 0      CONTINUE these medications which have NOT CHANGED   Details  atorvastatin (LIPITOR) 10 MG tablet Take 10 mg by mouth daily at 6 PM.    brimonidine (ALPHAGAN P) 0.1 % SOLN Place 1 drop into both eyes  every 12 (twelve) hours.      latanoprost (XALATAN) 0.005 % ophthalmic solution Place 1 drop into both eyes at bedtime.     levothyroxine (SYNTHROID, LEVOTHROID) 25 MCG tablet Take 25 mcg by mouth daily before breakfast.     losartan (COZAAR) 50 MG tablet TAKE 1 TABLET BY MOUTH ONCE DAILY    metoprolol tartrate (LOPRESSOR) 25 MG tablet TAKE 1 TABLET BY MOUTH TWICE DAILY Qty: 60 tablet, Refills: 11    multivitamin-iron-minerals-folic  acid (CENTRUM) chewable tablet Chew 1 tablet by mouth daily.    omeprazole (PRILOSEC) 20 MG capsule TAKE 1 CAPSULE(20 MG) BY MOUTH TWICE DAILY BEFORE A MEAL Qty: 60 capsule, Refills: 2    warfarin (COUMADIN) 7.5 MG tablet Take 1/2 to 1 tablet by mouth daily as directed by coumadin clinic Qty: 90 tablet, Refills: 0    cetirizine (ZYRTEC) 10 MG tablet Take 1 tablet (10 mg total) by mouth daily. Qty: 30 tablet, Refills: 5    chlorpheniramine (CHLOR-TRIMETON) 4 MG tablet Take 4 mg by mouth 2 (two) times daily as needed for allergies.    ipratropium (ATROVENT) 0.03 % nasal spray Place 2 sprays into both nostrils 3 (three) times daily as needed for rhinitis. Qty: 30 mL, Refills: 12       Allergies  Allergen Reactions  . Codeine Nausea Only  . Erythromycin Nausea And Vomiting      The results of significant diagnostics from this hospitalization (including imaging, microbiology, ancillary and laboratory) are listed below for reference.    Significant Diagnostic Studies: Dg Chest 2 View  Result Date: 03/23/2016 CLINICAL DATA:  Fever, aching and coughing. EXAM: CHEST  2 VIEW COMPARISON:  11/16/2010 FINDINGS: Heart size is normal. Mediastinal shadows are normal except for aortic calcification. There is central bronchial thickening but there is no infiltrate, collapse or effusion. No acute bone finding. IMPRESSION: Bronchitis pattern.  No consolidation or collapse. Electronically Signed   By: Nelson Chimes M.D.   On: 03/23/2016 18:58    Microbiology: Recent Results (from the past 240 hour(s))  Urine culture     Status: Abnormal   Collection Time: 03/23/16  4:50 PM  Result Value Ref Range Status   Specimen Description URINE, CATHETERIZED  Final   Special Requests NONE  Final   Culture >=100,000 COLONIES/mL ESCHERICHIA COLI (A)  Final   Report Status 03/26/2016 FINAL  Final   Organism ID, Bacteria ESCHERICHIA COLI (A)  Final      Susceptibility   Escherichia coli - MIC*    AMPICILLIN  >=32 RESISTANT Resistant     CEFAZOLIN <=4 SENSITIVE Sensitive     CEFTRIAXONE <=1 SENSITIVE Sensitive     CIPROFLOXACIN <=0.25 SENSITIVE Sensitive     GENTAMICIN 8 INTERMEDIATE Intermediate     IMIPENEM <=0.25 SENSITIVE Sensitive     NITROFURANTOIN 64 INTERMEDIATE Intermediate     TRIMETH/SULFA <=20 SENSITIVE Sensitive     AMPICILLIN/SULBACTAM >=32 RESISTANT Resistant     PIP/TAZO <=4 SENSITIVE Sensitive     Extended ESBL NEGATIVE Sensitive     * >=100,000 COLONIES/mL ESCHERICHIA COLI     Labs: Basic Metabolic Panel:  Recent Labs Lab 03/23/16 1651 03/24/16 0454 03/25/16 0439  NA 135 138 137  K 3.0* 4.3 3.9  CL 99* 110 107  CO2 23 21* 23  GLUCOSE 107* 88 99  BUN 24* 19 14  CREATININE 1.31* 0.80 0.91  CALCIUM 8.6* 7.7* 8.1*   Liver Function Tests:  Recent Labs Lab 03/23/16 1651  AST 61*  ALT  30  ALKPHOS 64  BILITOT 0.9  PROT 7.3  ALBUMIN 4.0   No results for input(s): LIPASE, AMYLASE in the last 168 hours. No results for input(s): AMMONIA in the last 168 hours. CBC:  Recent Labs Lab 03/23/16 1651 03/24/16 0454 03/25/16 0439  WBC 4.0 3.0* 2.7*  NEUTROABS 2.4  --   --   HGB 13.4 11.1* 10.4*  HCT 39.3 32.5* 30.5*  MCV 89.9 90.3 91.0  PLT 97* 92* 95*   Cardiac Enzymes: No results for input(s): CKTOTAL, CKMB, CKMBINDEX, TROPONINI in the last 168 hours. BNP: BNP (last 3 results)  Recent Labs  03/23/16 1651  BNP 46.9    ProBNP (last 3 results) No results for input(s): PROBNP in the last 8760 hours.  CBG:  Recent Labs Lab 03/23/16 1651  GLUCAP 100*       Signed:  Oswald Hillock MD.  [facility] 03/26/2016, 9:21 AM

## 2016-03-31 ENCOUNTER — Telehealth: Payer: Self-pay | Admitting: Cardiovascular Disease

## 2016-03-31 ENCOUNTER — Telehealth: Payer: Self-pay | Admitting: Emergency Medicine

## 2016-03-31 MED ORDER — IPRATROPIUM BROMIDE 0.03 % NA SOLN: 2.0000 | Freq: Three times a day (TID) | NASAL | 12 refills | Status: DC | PRN

## 2016-03-31 MED ORDER — BENZONATATE 200 MG PO CAPS: 200.0000 mg | ORAL_CAPSULE | Freq: Three times a day (TID) | ORAL | 0 refills | Status: DC | PRN

## 2016-03-31 NOTE — Telephone Encounter (Signed)
Spoke with the pt and notified of recs per CDY  Pt verbalized understanding  Rx was sent

## 2016-03-31 NOTE — Telephone Encounter (Signed)
Routed to anticoagulation/clinical pharmacy pool

## 2016-03-31 NOTE — Telephone Encounter (Signed)
Spoke with pt. States she is not feeling well. Reports cough and SOB. Cough is producing clear mucus. Denies chest tightness, wheezing or fever. Was recently treated for the flu at the hospital. Would like recommendations on what to do.  CY - please advise. Thanks.  Allergies  Allergen Reactions  . Codeine Nausea Only  . Erythromycin Nausea And Vomiting   Current Outpatient Prescriptions on File Prior to Visit  Medication Sig Dispense Refill  . atorvastatin (LIPITOR) 10 MG tablet Take 10 mg by mouth daily at 6 PM.    . brimonidine (ALPHAGAN P) 0.1 % SOLN Place 1 drop into both eyes every 12 (twelve) hours.      . cephALEXin (KEFLEX) 500 MG capsule Take 1 capsule (500 mg total) by mouth 2 (two) times daily. 10 capsule 0  . cetirizine (ZYRTEC) 10 MG tablet Take 1 tablet (10 mg total) by mouth daily. 30 tablet 5  . chlorpheniramine (CHLOR-TRIMETON) 4 MG tablet Take 4 mg by mouth 2 (two) times daily as needed for allergies.    Marland Kitchen ipratropium (ATROVENT) 0.03 % nasal spray Place 2 sprays into both nostrils 3 (three) times daily as needed for rhinitis. 30 mL 12  . latanoprost (XALATAN) 0.005 % ophthalmic solution Place 1 drop into both eyes at bedtime.     Marland Kitchen levothyroxine (SYNTHROID, LEVOTHROID) 25 MCG tablet Take 25 mcg by mouth daily before breakfast.     . losartan (COZAAR) 50 MG tablet TAKE 1 TABLET BY MOUTH ONCE DAILY    . metoprolol tartrate (LOPRESSOR) 25 MG tablet TAKE 1 TABLET BY MOUTH TWICE DAILY 60 tablet 11  . multivitamin-iron-minerals-folic acid (CENTRUM) chewable tablet Chew 1 tablet by mouth daily.    Marland Kitchen omeprazole (PRILOSEC) 20 MG capsule TAKE 1 CAPSULE(20 MG) BY MOUTH TWICE DAILY BEFORE A MEAL 60 capsule 2  . oseltamivir (TAMIFLU) 30 MG capsule Take 1 capsule (30 mg total) by mouth 2 (two) times daily. 4 capsule 0  . warfarin (COUMADIN) 7.5 MG tablet Take 1/2 to 1 tablet by mouth daily as directed by coumadin clinic (Patient taking differently: Take 3.75-7.5 mg by mouth daily.  Take 0.5 tablet (3.75 mg) on MWF and Take 1 tablet (7.5 mg) on Sun, Tues, Thurs & Sat.) 90 tablet 0   No current facility-administered medications on file prior to visit.

## 2016-03-31 NOTE — Telephone Encounter (Signed)
Spoke to patient and she reports that she was recently hospitalized with the flu and she would like to wait until next week to be seen so that hopefully she will be stronger and feel better. Advised that would be ok as they checked her INR on 03/26/16 and it was therapeutic. She states they changed her dose while inpatient, but she has been taking the same dose as previous since discharge. Advised to continue that dose. She also states that she mixed Monday and Tuesdays doses. Advised ok to continue on as scheduled and we will adjust based on INR next week. Appt scheduled. Pt stated understanding and appreciation.

## 2016-03-31 NOTE — Telephone Encounter (Signed)
Offer tessalon perles 200 mg, # 30, 1 every 8 hours if needed for cough  If no fever or infected looking mucus, then don't expect antibiotic to help.  Ok to take an otc med for symptoms, like Tylenol Cold And Flu if those help   Stay well hydrated, rest a lot. Let us know if not better by first of week. ER this weekend if gets worse.

## 2016-03-31 NOTE — Telephone Encounter (Signed)
Patient calling to see if it would be okay to have her coumadin checked "once she gets stronger." Patient was hospitalized with the flu and stated that she lost a good amount of blood. Please call, thanks.

## 2016-04-02 DIAGNOSIS — Z86711 Personal history of pulmonary embolism: Secondary | ICD-10-CM | POA: Diagnosis not present

## 2016-04-02 DIAGNOSIS — D6851 Activated protein C resistance: Secondary | ICD-10-CM | POA: Diagnosis not present

## 2016-04-02 DIAGNOSIS — Z7901 Long term (current) use of anticoagulants: Secondary | ICD-10-CM | POA: Diagnosis not present

## 2016-04-02 DIAGNOSIS — I10 Essential (primary) hypertension: Secondary | ICD-10-CM | POA: Diagnosis not present

## 2016-04-02 DIAGNOSIS — E039 Hypothyroidism, unspecified: Secondary | ICD-10-CM | POA: Diagnosis not present

## 2016-04-02 DIAGNOSIS — J09X2 Influenza due to identified novel influenza A virus with other respiratory manifestations: Secondary | ICD-10-CM | POA: Diagnosis not present

## 2016-04-02 DIAGNOSIS — Z87440 Personal history of urinary (tract) infections: Secondary | ICD-10-CM | POA: Diagnosis not present

## 2016-04-02 DIAGNOSIS — D696 Thrombocytopenia, unspecified: Secondary | ICD-10-CM | POA: Diagnosis not present

## 2016-04-02 DIAGNOSIS — Z5181 Encounter for therapeutic drug level monitoring: Secondary | ICD-10-CM | POA: Diagnosis not present

## 2016-04-02 LAB — POCT INR: INR: 2.2

## 2016-04-04 DIAGNOSIS — D6851 Activated protein C resistance: Secondary | ICD-10-CM | POA: Diagnosis not present

## 2016-04-04 DIAGNOSIS — D696 Thrombocytopenia, unspecified: Secondary | ICD-10-CM | POA: Diagnosis not present

## 2016-04-04 DIAGNOSIS — E039 Hypothyroidism, unspecified: Secondary | ICD-10-CM | POA: Diagnosis not present

## 2016-04-04 DIAGNOSIS — J09X2 Influenza due to identified novel influenza A virus with other respiratory manifestations: Secondary | ICD-10-CM | POA: Diagnosis not present

## 2016-04-04 DIAGNOSIS — I10 Essential (primary) hypertension: Secondary | ICD-10-CM | POA: Diagnosis not present

## 2016-04-04 DIAGNOSIS — Z5181 Encounter for therapeutic drug level monitoring: Secondary | ICD-10-CM | POA: Diagnosis not present

## 2016-04-05 ENCOUNTER — Ambulatory Visit (INDEPENDENT_AMBULATORY_CARE_PROVIDER_SITE_OTHER): Payer: Medicare Other | Admitting: Pharmacist Clinician (PhC)/ Clinical Pharmacy Specialist

## 2016-04-05 DIAGNOSIS — D6851 Activated protein C resistance: Secondary | ICD-10-CM

## 2016-04-06 ENCOUNTER — Telehealth: Payer: Self-pay | Admitting: Emergency Medicine

## 2016-04-06 ENCOUNTER — Ambulatory Visit (INDEPENDENT_AMBULATORY_CARE_PROVIDER_SITE_OTHER): Payer: Medicare Other | Admitting: Pharmacist

## 2016-04-06 DIAGNOSIS — D696 Thrombocytopenia, unspecified: Secondary | ICD-10-CM | POA: Diagnosis not present

## 2016-04-06 DIAGNOSIS — J09X2 Influenza due to identified novel influenza A virus with other respiratory manifestations: Secondary | ICD-10-CM | POA: Diagnosis not present

## 2016-04-06 DIAGNOSIS — D6851 Activated protein C resistance: Secondary | ICD-10-CM | POA: Diagnosis not present

## 2016-04-06 DIAGNOSIS — I10 Essential (primary) hypertension: Secondary | ICD-10-CM | POA: Diagnosis not present

## 2016-04-06 DIAGNOSIS — E039 Hypothyroidism, unspecified: Secondary | ICD-10-CM | POA: Diagnosis not present

## 2016-04-06 DIAGNOSIS — Z5181 Encounter for therapeutic drug level monitoring: Secondary | ICD-10-CM | POA: Diagnosis not present

## 2016-04-06 LAB — PROTIME-INR: INR: 2.9 — AB (ref <=1.1)

## 2016-04-06 NOTE — Telephone Encounter (Signed)
Yes this is OK 

## 2016-04-06 NOTE — Telephone Encounter (Signed)
RB  Please Advise-  Pt. Called in and stated she is still coughing even using the tessalon perles that she was placed on, and she wanted to know if you had any concerns of her taking robitussin as well with it. Also she wanted to know would you recommend robitussin cf or dm.

## 2016-04-06 NOTE — Telephone Encounter (Signed)
Spoke with pt,. And made her aware of RB recc. Pt. Understood and had no further questions, explained to her the difference if cf and dm and informed her she choose which which one fits her symptoms and that she is more than happy to speak with the pharmacist. Nothing further is needed

## 2016-04-08 DIAGNOSIS — E039 Hypothyroidism, unspecified: Secondary | ICD-10-CM | POA: Diagnosis not present

## 2016-04-08 DIAGNOSIS — J09X2 Influenza due to identified novel influenza A virus with other respiratory manifestations: Secondary | ICD-10-CM | POA: Diagnosis not present

## 2016-04-08 DIAGNOSIS — Z5181 Encounter for therapeutic drug level monitoring: Secondary | ICD-10-CM | POA: Diagnosis not present

## 2016-04-08 DIAGNOSIS — D6851 Activated protein C resistance: Secondary | ICD-10-CM | POA: Diagnosis not present

## 2016-04-08 DIAGNOSIS — D696 Thrombocytopenia, unspecified: Secondary | ICD-10-CM | POA: Diagnosis not present

## 2016-04-08 DIAGNOSIS — I10 Essential (primary) hypertension: Secondary | ICD-10-CM | POA: Diagnosis not present

## 2016-04-11 DIAGNOSIS — D6851 Activated protein C resistance: Secondary | ICD-10-CM | POA: Diagnosis not present

## 2016-04-11 DIAGNOSIS — J09X2 Influenza due to identified novel influenza A virus with other respiratory manifestations: Secondary | ICD-10-CM | POA: Diagnosis not present

## 2016-04-11 DIAGNOSIS — I10 Essential (primary) hypertension: Secondary | ICD-10-CM | POA: Diagnosis not present

## 2016-04-11 DIAGNOSIS — E039 Hypothyroidism, unspecified: Secondary | ICD-10-CM | POA: Diagnosis not present

## 2016-04-11 DIAGNOSIS — D696 Thrombocytopenia, unspecified: Secondary | ICD-10-CM | POA: Diagnosis not present

## 2016-04-11 DIAGNOSIS — Z5181 Encounter for therapeutic drug level monitoring: Secondary | ICD-10-CM | POA: Diagnosis not present

## 2016-04-12 DIAGNOSIS — I10 Essential (primary) hypertension: Secondary | ICD-10-CM | POA: Diagnosis not present

## 2016-04-12 DIAGNOSIS — D6851 Activated protein C resistance: Secondary | ICD-10-CM | POA: Diagnosis not present

## 2016-04-12 DIAGNOSIS — D696 Thrombocytopenia, unspecified: Secondary | ICD-10-CM | POA: Diagnosis not present

## 2016-04-12 DIAGNOSIS — E039 Hypothyroidism, unspecified: Secondary | ICD-10-CM | POA: Diagnosis not present

## 2016-04-12 DIAGNOSIS — J09X2 Influenza due to identified novel influenza A virus with other respiratory manifestations: Secondary | ICD-10-CM | POA: Diagnosis not present

## 2016-04-12 DIAGNOSIS — Z5181 Encounter for therapeutic drug level monitoring: Secondary | ICD-10-CM | POA: Diagnosis not present

## 2016-04-13 DIAGNOSIS — D6851 Activated protein C resistance: Secondary | ICD-10-CM | POA: Diagnosis not present

## 2016-04-13 DIAGNOSIS — I10 Essential (primary) hypertension: Secondary | ICD-10-CM | POA: Diagnosis not present

## 2016-04-13 DIAGNOSIS — Z5181 Encounter for therapeutic drug level monitoring: Secondary | ICD-10-CM | POA: Diagnosis not present

## 2016-04-13 DIAGNOSIS — D696 Thrombocytopenia, unspecified: Secondary | ICD-10-CM | POA: Diagnosis not present

## 2016-04-13 DIAGNOSIS — E039 Hypothyroidism, unspecified: Secondary | ICD-10-CM | POA: Diagnosis not present

## 2016-04-13 DIAGNOSIS — J09X2 Influenza due to identified novel influenza A virus with other respiratory manifestations: Secondary | ICD-10-CM | POA: Diagnosis not present

## 2016-04-14 DIAGNOSIS — I10 Essential (primary) hypertension: Secondary | ICD-10-CM | POA: Diagnosis not present

## 2016-04-14 DIAGNOSIS — D693 Immune thrombocytopenic purpura: Secondary | ICD-10-CM | POA: Insufficient documentation

## 2016-04-14 DIAGNOSIS — D682 Hereditary deficiency of other clotting factors: Secondary | ICD-10-CM | POA: Diagnosis not present

## 2016-04-14 DIAGNOSIS — N179 Acute kidney failure, unspecified: Secondary | ICD-10-CM | POA: Diagnosis not present

## 2016-04-14 DIAGNOSIS — D696 Thrombocytopenia, unspecified: Secondary | ICD-10-CM | POA: Diagnosis not present

## 2016-04-14 DIAGNOSIS — Z5181 Encounter for therapeutic drug level monitoring: Secondary | ICD-10-CM | POA: Diagnosis not present

## 2016-04-14 DIAGNOSIS — D6851 Activated protein C resistance: Secondary | ICD-10-CM | POA: Diagnosis not present

## 2016-04-14 DIAGNOSIS — J09X2 Influenza due to identified novel influenza A virus with other respiratory manifestations: Secondary | ICD-10-CM | POA: Diagnosis not present

## 2016-04-14 DIAGNOSIS — E039 Hypothyroidism, unspecified: Secondary | ICD-10-CM | POA: Diagnosis not present

## 2016-04-18 DIAGNOSIS — J09X2 Influenza due to identified novel influenza A virus with other respiratory manifestations: Secondary | ICD-10-CM | POA: Diagnosis not present

## 2016-04-18 DIAGNOSIS — D696 Thrombocytopenia, unspecified: Secondary | ICD-10-CM | POA: Diagnosis not present

## 2016-04-18 DIAGNOSIS — Z5181 Encounter for therapeutic drug level monitoring: Secondary | ICD-10-CM | POA: Diagnosis not present

## 2016-04-18 DIAGNOSIS — E039 Hypothyroidism, unspecified: Secondary | ICD-10-CM | POA: Diagnosis not present

## 2016-04-18 DIAGNOSIS — D6851 Activated protein C resistance: Secondary | ICD-10-CM | POA: Diagnosis not present

## 2016-04-18 DIAGNOSIS — I10 Essential (primary) hypertension: Secondary | ICD-10-CM | POA: Diagnosis not present

## 2016-04-19 DIAGNOSIS — Z5181 Encounter for therapeutic drug level monitoring: Secondary | ICD-10-CM | POA: Diagnosis not present

## 2016-04-19 DIAGNOSIS — D696 Thrombocytopenia, unspecified: Secondary | ICD-10-CM | POA: Diagnosis not present

## 2016-04-19 DIAGNOSIS — I10 Essential (primary) hypertension: Secondary | ICD-10-CM | POA: Diagnosis not present

## 2016-04-19 DIAGNOSIS — E039 Hypothyroidism, unspecified: Secondary | ICD-10-CM | POA: Diagnosis not present

## 2016-04-19 DIAGNOSIS — D6851 Activated protein C resistance: Secondary | ICD-10-CM | POA: Diagnosis not present

## 2016-04-19 DIAGNOSIS — J09X2 Influenza due to identified novel influenza A virus with other respiratory manifestations: Secondary | ICD-10-CM | POA: Diagnosis not present

## 2016-04-20 ENCOUNTER — Ambulatory Visit (INDEPENDENT_AMBULATORY_CARE_PROVIDER_SITE_OTHER): Payer: Medicare Other | Admitting: Pharmacist

## 2016-04-20 DIAGNOSIS — D6851 Activated protein C resistance: Secondary | ICD-10-CM | POA: Diagnosis not present

## 2016-04-20 DIAGNOSIS — D696 Thrombocytopenia, unspecified: Secondary | ICD-10-CM | POA: Diagnosis not present

## 2016-04-20 DIAGNOSIS — E039 Hypothyroidism, unspecified: Secondary | ICD-10-CM | POA: Diagnosis not present

## 2016-04-20 DIAGNOSIS — Z5181 Encounter for therapeutic drug level monitoring: Secondary | ICD-10-CM | POA: Diagnosis not present

## 2016-04-20 DIAGNOSIS — I10 Essential (primary) hypertension: Secondary | ICD-10-CM | POA: Diagnosis not present

## 2016-04-20 DIAGNOSIS — J09X2 Influenza due to identified novel influenza A virus with other respiratory manifestations: Secondary | ICD-10-CM | POA: Diagnosis not present

## 2016-04-20 LAB — PROTIME-INR: INR: 3.2 — AB (ref <=1.1)

## 2016-04-21 DIAGNOSIS — D696 Thrombocytopenia, unspecified: Secondary | ICD-10-CM | POA: Diagnosis not present

## 2016-04-21 DIAGNOSIS — D6851 Activated protein C resistance: Secondary | ICD-10-CM | POA: Diagnosis not present

## 2016-04-21 DIAGNOSIS — Z5181 Encounter for therapeutic drug level monitoring: Secondary | ICD-10-CM | POA: Diagnosis not present

## 2016-04-21 DIAGNOSIS — I10 Essential (primary) hypertension: Secondary | ICD-10-CM | POA: Diagnosis not present

## 2016-04-21 DIAGNOSIS — E039 Hypothyroidism, unspecified: Secondary | ICD-10-CM | POA: Diagnosis not present

## 2016-04-21 DIAGNOSIS — J09X2 Influenza due to identified novel influenza A virus with other respiratory manifestations: Secondary | ICD-10-CM | POA: Diagnosis not present

## 2016-04-22 ENCOUNTER — Telehealth: Payer: Self-pay | Admitting: Emergency Medicine

## 2016-04-22 DIAGNOSIS — J09X2 Influenza due to identified novel influenza A virus with other respiratory manifestations: Secondary | ICD-10-CM | POA: Diagnosis not present

## 2016-04-22 DIAGNOSIS — Z5181 Encounter for therapeutic drug level monitoring: Secondary | ICD-10-CM | POA: Diagnosis not present

## 2016-04-22 DIAGNOSIS — I10 Essential (primary) hypertension: Secondary | ICD-10-CM | POA: Diagnosis not present

## 2016-04-22 DIAGNOSIS — E039 Hypothyroidism, unspecified: Secondary | ICD-10-CM | POA: Diagnosis not present

## 2016-04-22 DIAGNOSIS — D6851 Activated protein C resistance: Secondary | ICD-10-CM | POA: Diagnosis not present

## 2016-04-22 DIAGNOSIS — D696 Thrombocytopenia, unspecified: Secondary | ICD-10-CM | POA: Diagnosis not present

## 2016-04-22 MED ORDER — OMEPRAZOLE 40 MG PO CPDR: 40.0000 mg | DELAYED_RELEASE_CAPSULE | Freq: Every day | ORAL | 5 refills | Status: DC

## 2016-04-22 NOTE — Telephone Encounter (Signed)
Yes change to omeprazole 40mg  qd, see if they will cover that

## 2016-04-22 NOTE — Telephone Encounter (Signed)
Received a fax from pt's pharmacy. Her insurance will no longer cover Omeprazole 20mg  BID. They will only cover it for once daily.  RB - please advise if you want to change instructions or change pt's medication >> Omeprazole 40mg  daily? Thanks.

## 2016-04-22 NOTE — Telephone Encounter (Signed)
Rx has been sent in per RB. Nothing further was needed. 

## 2016-04-25 DIAGNOSIS — J09X2 Influenza due to identified novel influenza A virus with other respiratory manifestations: Secondary | ICD-10-CM | POA: Diagnosis not present

## 2016-04-25 DIAGNOSIS — Z5181 Encounter for therapeutic drug level monitoring: Secondary | ICD-10-CM | POA: Diagnosis not present

## 2016-04-25 DIAGNOSIS — I10 Essential (primary) hypertension: Secondary | ICD-10-CM | POA: Diagnosis not present

## 2016-04-25 DIAGNOSIS — E039 Hypothyroidism, unspecified: Secondary | ICD-10-CM | POA: Diagnosis not present

## 2016-04-25 DIAGNOSIS — D6851 Activated protein C resistance: Secondary | ICD-10-CM | POA: Diagnosis not present

## 2016-04-25 DIAGNOSIS — D696 Thrombocytopenia, unspecified: Secondary | ICD-10-CM | POA: Diagnosis not present

## 2016-04-26 ENCOUNTER — Encounter: Payer: Self-pay | Admitting: Podiatry

## 2016-04-26 ENCOUNTER — Ambulatory Visit (INDEPENDENT_AMBULATORY_CARE_PROVIDER_SITE_OTHER): Payer: Medicare Other | Admitting: Podiatry

## 2016-04-26 DIAGNOSIS — B351 Tinea unguium: Secondary | ICD-10-CM

## 2016-04-26 DIAGNOSIS — M79676 Pain in unspecified toe(s): Secondary | ICD-10-CM

## 2016-04-26 DIAGNOSIS — D6851 Activated protein C resistance: Secondary | ICD-10-CM | POA: Diagnosis not present

## 2016-04-26 DIAGNOSIS — Z5181 Encounter for therapeutic drug level monitoring: Secondary | ICD-10-CM | POA: Diagnosis not present

## 2016-04-26 DIAGNOSIS — I10 Essential (primary) hypertension: Secondary | ICD-10-CM | POA: Diagnosis not present

## 2016-04-26 DIAGNOSIS — E039 Hypothyroidism, unspecified: Secondary | ICD-10-CM | POA: Diagnosis not present

## 2016-04-26 DIAGNOSIS — D696 Thrombocytopenia, unspecified: Secondary | ICD-10-CM | POA: Diagnosis not present

## 2016-04-26 DIAGNOSIS — L84 Corns and callosities: Secondary | ICD-10-CM

## 2016-04-26 DIAGNOSIS — J09X2 Influenza due to identified novel influenza A virus with other respiratory manifestations: Secondary | ICD-10-CM | POA: Diagnosis not present

## 2016-04-26 NOTE — Progress Notes (Signed)
Complaint:  Visit Type: Patient returns to my office for  preventative foot care services. Complaint: Patient states" my nails have grown long and thick and become painful to walk and wear shoes" . The patient presents for preventative foot care services. No changes to ROS  Podiatric Exam: Vascular: dorsalis pedis and posterior tibial pulses are palpable bilateral. Capillary return is immediate. Temperature gradient is WNL. Skin turgor WNL  Sensorium: Normal Semmes Weinstein monofilament test. Normal tactile sensation bilaterally. Nail Exam: Pt has thick disfigured discolored nails with subungual debris noted bilateral entire nail hallux . Ulcer Exam: There is no evidence of ulcer or pre-ulcerative changes or infection. Orthopedic Exam: Muscle tone and strength are WNL. No limitations in general ROM. No crepitus or effusions noted. Foot type and digits show no abnormalities. Bony prominences are unremarkable. Skin: No Porokeratosis. No infection or ulcers.  Digital clavi fourth toe right foot.  Diagnosis:  Onychomycosis, , Pain in right toe, pain in left toes, Debride clavi  Treatment & Plan Procedures and Treatment: Consent by patient was obtained for treatment procedures. The patient understood the discussion of treatment and procedures well. All questions were answered thoroughly reviewed. Debridement of mycotic and hypertrophic toenails, 1 through 5 bilateral and clearing of subungual debris. No ulceration, no infection noted. Debride clavi Return Visit-Office Procedure: Patient instructed to return to the office for a follow up visit 3 months for continued evaluation and treatment.    Gardiner Barefoot DPM

## 2016-04-27 ENCOUNTER — Telehealth: Payer: Self-pay | Admitting: Cardiovascular Disease

## 2016-04-27 NOTE — Telephone Encounter (Signed)
Thank you  We will f/u tomorrow

## 2016-04-27 NOTE — Telephone Encounter (Signed)
Fax received from [facility] and signed by Dr. Gwenlyn Found on 04/26/16 and faxed back to number provided.  For Order SN [facility] Dr. Rhona Leavens  --pt is not to take Coumadin on Wednesday 04/20/16 and resume normal previous dosing after Wednesday 04/20/16. INR to be rechecked on Thursday, 04/28/16 via Co[facility] machine.

## 2016-04-28 DIAGNOSIS — Z5181 Encounter for therapeutic drug level monitoring: Secondary | ICD-10-CM | POA: Diagnosis not present

## 2016-04-28 DIAGNOSIS — D6851 Activated protein C resistance: Secondary | ICD-10-CM | POA: Diagnosis not present

## 2016-04-28 DIAGNOSIS — I10 Essential (primary) hypertension: Secondary | ICD-10-CM | POA: Diagnosis not present

## 2016-04-28 DIAGNOSIS — J09X2 Influenza due to identified novel influenza A virus with other respiratory manifestations: Secondary | ICD-10-CM | POA: Diagnosis not present

## 2016-04-28 DIAGNOSIS — D696 Thrombocytopenia, unspecified: Secondary | ICD-10-CM | POA: Diagnosis not present

## 2016-04-28 DIAGNOSIS — E039 Hypothyroidism, unspecified: Secondary | ICD-10-CM | POA: Diagnosis not present

## 2016-04-28 LAB — POCT INR: INR: 2.8

## 2016-04-29 ENCOUNTER — Ambulatory Visit (INDEPENDENT_AMBULATORY_CARE_PROVIDER_SITE_OTHER): Payer: Medicare Other | Admitting: Pharmacist Clinician (PhC)/ Clinical Pharmacy Specialist

## 2016-04-29 DIAGNOSIS — D696 Thrombocytopenia, unspecified: Secondary | ICD-10-CM | POA: Diagnosis not present

## 2016-04-29 DIAGNOSIS — D6851 Activated protein C resistance: Secondary | ICD-10-CM

## 2016-04-29 DIAGNOSIS — J09X2 Influenza due to identified novel influenza A virus with other respiratory manifestations: Secondary | ICD-10-CM | POA: Diagnosis not present

## 2016-04-29 DIAGNOSIS — Z5181 Encounter for therapeutic drug level monitoring: Secondary | ICD-10-CM | POA: Diagnosis not present

## 2016-04-29 DIAGNOSIS — E039 Hypothyroidism, unspecified: Secondary | ICD-10-CM | POA: Diagnosis not present

## 2016-04-29 DIAGNOSIS — I10 Essential (primary) hypertension: Secondary | ICD-10-CM | POA: Diagnosis not present

## 2016-05-03 DIAGNOSIS — J09X2 Influenza due to identified novel influenza A virus with other respiratory manifestations: Secondary | ICD-10-CM | POA: Diagnosis not present

## 2016-05-03 DIAGNOSIS — D6851 Activated protein C resistance: Secondary | ICD-10-CM | POA: Diagnosis not present

## 2016-05-03 DIAGNOSIS — E039 Hypothyroidism, unspecified: Secondary | ICD-10-CM | POA: Diagnosis not present

## 2016-05-03 DIAGNOSIS — Z5181 Encounter for therapeutic drug level monitoring: Secondary | ICD-10-CM | POA: Diagnosis not present

## 2016-05-03 DIAGNOSIS — I10 Essential (primary) hypertension: Secondary | ICD-10-CM | POA: Diagnosis not present

## 2016-05-03 DIAGNOSIS — D696 Thrombocytopenia, unspecified: Secondary | ICD-10-CM | POA: Diagnosis not present

## 2016-05-04 DIAGNOSIS — Z5181 Encounter for therapeutic drug level monitoring: Secondary | ICD-10-CM | POA: Diagnosis not present

## 2016-05-04 DIAGNOSIS — E039 Hypothyroidism, unspecified: Secondary | ICD-10-CM | POA: Diagnosis not present

## 2016-05-04 DIAGNOSIS — D6851 Activated protein C resistance: Secondary | ICD-10-CM | POA: Diagnosis not present

## 2016-05-04 DIAGNOSIS — I10 Essential (primary) hypertension: Secondary | ICD-10-CM | POA: Diagnosis not present

## 2016-05-04 DIAGNOSIS — J09X2 Influenza due to identified novel influenza A virus with other respiratory manifestations: Secondary | ICD-10-CM | POA: Diagnosis not present

## 2016-05-04 DIAGNOSIS — D696 Thrombocytopenia, unspecified: Secondary | ICD-10-CM | POA: Diagnosis not present

## 2016-05-05 ENCOUNTER — Ambulatory Visit (INDEPENDENT_AMBULATORY_CARE_PROVIDER_SITE_OTHER): Payer: Medicare Other | Admitting: Pharmacist Clinician (PhC)/ Clinical Pharmacy Specialist

## 2016-05-05 DIAGNOSIS — D6851 Activated protein C resistance: Secondary | ICD-10-CM | POA: Diagnosis not present

## 2016-05-05 DIAGNOSIS — Z5181 Encounter for therapeutic drug level monitoring: Secondary | ICD-10-CM | POA: Diagnosis not present

## 2016-05-05 DIAGNOSIS — I10 Essential (primary) hypertension: Secondary | ICD-10-CM | POA: Diagnosis not present

## 2016-05-05 DIAGNOSIS — D696 Thrombocytopenia, unspecified: Secondary | ICD-10-CM | POA: Diagnosis not present

## 2016-05-05 DIAGNOSIS — E039 Hypothyroidism, unspecified: Secondary | ICD-10-CM | POA: Diagnosis not present

## 2016-05-05 DIAGNOSIS — J09X2 Influenza due to identified novel influenza A virus with other respiratory manifestations: Secondary | ICD-10-CM | POA: Diagnosis not present

## 2016-05-05 LAB — POCT INR: INR: 2.8

## 2016-05-06 DIAGNOSIS — J09X2 Influenza due to identified novel influenza A virus with other respiratory manifestations: Secondary | ICD-10-CM | POA: Diagnosis not present

## 2016-05-06 DIAGNOSIS — I10 Essential (primary) hypertension: Secondary | ICD-10-CM | POA: Diagnosis not present

## 2016-05-06 DIAGNOSIS — Z5181 Encounter for therapeutic drug level monitoring: Secondary | ICD-10-CM | POA: Diagnosis not present

## 2016-05-06 DIAGNOSIS — E039 Hypothyroidism, unspecified: Secondary | ICD-10-CM | POA: Diagnosis not present

## 2016-05-06 DIAGNOSIS — D6851 Activated protein C resistance: Secondary | ICD-10-CM | POA: Diagnosis not present

## 2016-05-06 DIAGNOSIS — D696 Thrombocytopenia, unspecified: Secondary | ICD-10-CM | POA: Diagnosis not present

## 2016-05-10 ENCOUNTER — Other Ambulatory Visit: Payer: Self-pay | Admitting: Pharmacist

## 2016-05-10 DIAGNOSIS — J09X2 Influenza due to identified novel influenza A virus with other respiratory manifestations: Secondary | ICD-10-CM | POA: Diagnosis not present

## 2016-05-10 DIAGNOSIS — I10 Essential (primary) hypertension: Secondary | ICD-10-CM | POA: Diagnosis not present

## 2016-05-10 DIAGNOSIS — E039 Hypothyroidism, unspecified: Secondary | ICD-10-CM | POA: Diagnosis not present

## 2016-05-10 DIAGNOSIS — D6851 Activated protein C resistance: Secondary | ICD-10-CM | POA: Diagnosis not present

## 2016-05-10 DIAGNOSIS — Z5181 Encounter for therapeutic drug level monitoring: Secondary | ICD-10-CM | POA: Diagnosis not present

## 2016-05-10 DIAGNOSIS — D696 Thrombocytopenia, unspecified: Secondary | ICD-10-CM | POA: Diagnosis not present

## 2016-05-10 MED ORDER — WARFARIN SODIUM 7.5 MG PO TABS: ORAL_TABLET | ORAL | 0 refills | Status: DC

## 2016-05-11 ENCOUNTER — Telehealth: Payer: Self-pay | Admitting: Cardiovascular Disease

## 2016-05-11 NOTE — Telephone Encounter (Signed)
Fax Orders received from [facility] stating no changes to Coumadin dosing on 3/15. Will be rechecked on 05/12/16.  Signed by Dr. Gwenlyn Found on 05/10/16 and faxed back to number provided on 05/11/16.

## 2016-05-12 ENCOUNTER — Ambulatory Visit (INDEPENDENT_AMBULATORY_CARE_PROVIDER_SITE_OTHER): Payer: Medicare Other | Admitting: Pharmacist Clinician (PhC)/ Clinical Pharmacy Specialist

## 2016-05-12 DIAGNOSIS — Z5181 Encounter for therapeutic drug level monitoring: Secondary | ICD-10-CM | POA: Diagnosis not present

## 2016-05-12 DIAGNOSIS — D6851 Activated protein C resistance: Secondary | ICD-10-CM | POA: Diagnosis not present

## 2016-05-12 DIAGNOSIS — E039 Hypothyroidism, unspecified: Secondary | ICD-10-CM | POA: Diagnosis not present

## 2016-05-12 DIAGNOSIS — D696 Thrombocytopenia, unspecified: Secondary | ICD-10-CM | POA: Diagnosis not present

## 2016-05-12 DIAGNOSIS — I10 Essential (primary) hypertension: Secondary | ICD-10-CM | POA: Diagnosis not present

## 2016-05-12 DIAGNOSIS — J09X2 Influenza due to identified novel influenza A virus with other respiratory manifestations: Secondary | ICD-10-CM | POA: Diagnosis not present

## 2016-05-12 LAB — PROTIME-INR: INR: 2.7 — AB (ref <=1.1)

## 2016-05-16 DIAGNOSIS — J09X2 Influenza due to identified novel influenza A virus with other respiratory manifestations: Secondary | ICD-10-CM | POA: Diagnosis not present

## 2016-05-16 DIAGNOSIS — D696 Thrombocytopenia, unspecified: Secondary | ICD-10-CM | POA: Diagnosis not present

## 2016-05-16 DIAGNOSIS — I10 Essential (primary) hypertension: Secondary | ICD-10-CM | POA: Diagnosis not present

## 2016-05-16 DIAGNOSIS — D6851 Activated protein C resistance: Secondary | ICD-10-CM | POA: Diagnosis not present

## 2016-05-16 DIAGNOSIS — E039 Hypothyroidism, unspecified: Secondary | ICD-10-CM | POA: Diagnosis not present

## 2016-05-16 DIAGNOSIS — Z5181 Encounter for therapeutic drug level monitoring: Secondary | ICD-10-CM | POA: Diagnosis not present

## 2016-05-17 DIAGNOSIS — D6851 Activated protein C resistance: Secondary | ICD-10-CM | POA: Diagnosis not present

## 2016-05-17 DIAGNOSIS — J09X2 Influenza due to identified novel influenza A virus with other respiratory manifestations: Secondary | ICD-10-CM | POA: Diagnosis not present

## 2016-05-17 DIAGNOSIS — D696 Thrombocytopenia, unspecified: Secondary | ICD-10-CM | POA: Diagnosis not present

## 2016-05-17 DIAGNOSIS — Z5181 Encounter for therapeutic drug level monitoring: Secondary | ICD-10-CM | POA: Diagnosis not present

## 2016-05-17 DIAGNOSIS — E039 Hypothyroidism, unspecified: Secondary | ICD-10-CM | POA: Diagnosis not present

## 2016-05-17 DIAGNOSIS — I10 Essential (primary) hypertension: Secondary | ICD-10-CM | POA: Diagnosis not present

## 2016-05-26 DIAGNOSIS — H401131 Primary open-angle glaucoma, bilateral, mild stage: Secondary | ICD-10-CM | POA: Diagnosis not present

## 2016-05-26 DIAGNOSIS — H26491 Other secondary cataract, right eye: Secondary | ICD-10-CM | POA: Diagnosis not present

## 2016-05-26 DIAGNOSIS — Z961 Presence of intraocular lens: Secondary | ICD-10-CM | POA: Diagnosis not present

## 2016-06-07 DIAGNOSIS — D693 Immune thrombocytopenic purpura: Secondary | ICD-10-CM | POA: Diagnosis not present

## 2016-06-07 DIAGNOSIS — E039 Hypothyroidism, unspecified: Secondary | ICD-10-CM | POA: Diagnosis not present

## 2016-06-07 DIAGNOSIS — I10 Essential (primary) hypertension: Secondary | ICD-10-CM | POA: Diagnosis not present

## 2016-06-07 DIAGNOSIS — Z0000 Encounter for general adult medical examination without abnormal findings: Secondary | ICD-10-CM | POA: Diagnosis not present

## 2016-06-07 DIAGNOSIS — E782 Mixed hyperlipidemia: Secondary | ICD-10-CM | POA: Diagnosis not present

## 2016-06-07 DIAGNOSIS — Z9181 History of falling: Secondary | ICD-10-CM | POA: Diagnosis not present

## 2016-06-09 ENCOUNTER — Ambulatory Visit (INDEPENDENT_AMBULATORY_CARE_PROVIDER_SITE_OTHER): Payer: Medicare Other | Admitting: Pharmacist Clinician (PhC)/ Clinical Pharmacy Specialist

## 2016-06-09 DIAGNOSIS — Z5181 Encounter for therapeutic drug level monitoring: Secondary | ICD-10-CM

## 2016-06-09 DIAGNOSIS — D6851 Activated protein C resistance: Secondary | ICD-10-CM

## 2016-06-09 DIAGNOSIS — Z7901 Long term (current) use of anticoagulants: Secondary | ICD-10-CM

## 2016-06-09 LAB — POCT INR: INR: 2.9

## 2016-06-10 ENCOUNTER — Encounter: Payer: Medicare Other | Admitting: Podiatry

## 2016-06-13 ENCOUNTER — Encounter: Payer: Self-pay | Admitting: Podiatry

## 2016-06-13 ENCOUNTER — Ambulatory Visit (INDEPENDENT_AMBULATORY_CARE_PROVIDER_SITE_OTHER): Payer: Medicare Other

## 2016-06-13 ENCOUNTER — Ambulatory Visit (INDEPENDENT_AMBULATORY_CARE_PROVIDER_SITE_OTHER): Payer: Medicare Other | Admitting: Podiatry

## 2016-06-13 DIAGNOSIS — M2042 Other hammer toe(s) (acquired), left foot: Secondary | ICD-10-CM

## 2016-06-13 DIAGNOSIS — M2041 Other hammer toe(s) (acquired), right foot: Secondary | ICD-10-CM | POA: Diagnosis not present

## 2016-06-13 DIAGNOSIS — L97511 Non-pressure chronic ulcer of other part of right foot limited to breakdown of skin: Secondary | ICD-10-CM | POA: Diagnosis not present

## 2016-06-13 DIAGNOSIS — R52 Pain, unspecified: Secondary | ICD-10-CM

## 2016-06-13 NOTE — Progress Notes (Signed)
Rescheduled

## 2016-06-15 DIAGNOSIS — L97511 Non-pressure chronic ulcer of other part of right foot limited to breakdown of skin: Secondary | ICD-10-CM | POA: Insufficient documentation

## 2016-06-15 NOTE — Progress Notes (Signed)
Subjective: 83 year old female presents the office if her concerns of a painful area to the right fourth toe. She states this been ongoing since her last pointment. She denies he swelling or drainage or any pus. She's had no recent treatment since last appointment. Denies any systemic complaints such as fevers, chills, nausea, vomiting. No acute changes since last appointment, and no other complaints at this time.   Objective: AAO x3, NAD DP/PT pulses palpable bilaterally, CRT less than 3 seconds She is doing well from the previous bunion on the right and is well healed without any pain. Rigid hammertoes are present of the lesser digits. On the distal aspect of the right 4th digit is a thick hyperkeratotic lesion. There is dried blood under the callus. After debridement, there is a superficial abrasion type lesion to the distal portion of the digit. There is no edema, erythema, drainage, pus or any clinical signs of infection.  No open lesions or pre-ulcerative lesions.  No pain with calf compression, swelling, warmth, erythema  Assessment: Superficial wound right fourth toe without signs of infection  Plan: -All treatment options discussed with the patient including all alternatives, risks, complications.  -I debrided the hyperkeratotic lesion to reveal underlying superficial wound to the toe. Recommended a small amount of antibiotic ointment dressing changes daily. Offloading pads dispensed.  -Monitor for any clinical signs or symptoms of infection and directed to call the office immediately should any occur or go to the ER. -Patient encouraged to call the office with any questions, concerns, change in symptoms.   Celesta Gentile, DPM

## 2016-06-23 ENCOUNTER — Ambulatory Visit (INDEPENDENT_AMBULATORY_CARE_PROVIDER_SITE_OTHER): Payer: Medicare Other | Admitting: Podiatry

## 2016-06-23 DIAGNOSIS — L97511 Non-pressure chronic ulcer of other part of right foot limited to breakdown of skin: Secondary | ICD-10-CM

## 2016-06-27 NOTE — Progress Notes (Signed)
Subjective: 83 year old female presents the office today for follow-up evaluation of right fourth toe wound. She states the area has gotten somewhat better but is still painful when she puts pressure. Denies any swelling or redness or any drainage or pus. She has continue with antibiotic ointment dressing changes daily. Denies any systemic complaints such as fevers, chills, nausea, vomiting. No acute changes since last appointment, and no other complaints at this time.   Objective: AAO x3, NAD DP/PT pulses palpable bilaterally, CRT less than 3 seconds Adductovarus/hammertoe deformities are present lesser digits on the right foot. The distal aspect of the right fourth toe is a hyperkeratotic lesion with central granular wound. Upon debridement the wound does continued however does appear to be somewhat smaller. It measures 0.4 x 0.3 cm today and this is superficial any probing, undermining or tunneling. There is no swelling erythema, ascending cellulitis. There is no fluctuance, crepitus, malodor. No open lesions or pre-ulcerative lesions.  No pain with calf compression, swelling, warmth, erythema  Assessment: Superficial wound right fourth toe without signs of infection, some improvement  Plan: -All treatment options discussed with the patient including all alternatives, risks, complications.  I debrided the hyperkeratotic tissue and wound is granular healthy base. Continue with antibiotic ointment dressing changes daily. This time there is no signs of infection her continue to monitor. Continue offloading pads. -Patient encouraged to call the office with any questions, concerns, change in symptoms.   Celesta Gentile, DPM

## 2016-06-29 DIAGNOSIS — R300 Dysuria: Secondary | ICD-10-CM | POA: Diagnosis not present

## 2016-06-29 DIAGNOSIS — N3000 Acute cystitis without hematuria: Secondary | ICD-10-CM | POA: Diagnosis not present

## 2016-06-29 DIAGNOSIS — M810 Age-related osteoporosis without current pathological fracture: Secondary | ICD-10-CM | POA: Diagnosis not present

## 2016-06-29 DIAGNOSIS — I10 Essential (primary) hypertension: Secondary | ICD-10-CM | POA: Diagnosis not present

## 2016-07-05 ENCOUNTER — Ambulatory Visit (INDEPENDENT_AMBULATORY_CARE_PROVIDER_SITE_OTHER): Payer: Medicare Other | Admitting: Pharmacist Clinician (PhC)/ Clinical Pharmacy Specialist

## 2016-07-05 DIAGNOSIS — Z7901 Long term (current) use of anticoagulants: Secondary | ICD-10-CM

## 2016-07-05 DIAGNOSIS — D6851 Activated protein C resistance: Secondary | ICD-10-CM

## 2016-07-05 DIAGNOSIS — Z5181 Encounter for therapeutic drug level monitoring: Secondary | ICD-10-CM | POA: Diagnosis not present

## 2016-07-05 LAB — POCT INR: INR: 2.6

## 2016-07-13 DIAGNOSIS — L905 Scar conditions and fibrosis of skin: Secondary | ICD-10-CM | POA: Diagnosis not present

## 2016-07-13 DIAGNOSIS — Z85828 Personal history of other malignant neoplasm of skin: Secondary | ICD-10-CM | POA: Diagnosis not present

## 2016-07-13 DIAGNOSIS — L570 Actinic keratosis: Secondary | ICD-10-CM | POA: Diagnosis not present

## 2016-07-14 ENCOUNTER — Ambulatory Visit: Payer: Medicare Other | Admitting: Podiatry

## 2016-07-16 DIAGNOSIS — N390 Urinary tract infection, site not specified: Secondary | ICD-10-CM | POA: Diagnosis not present

## 2016-07-19 DIAGNOSIS — R197 Diarrhea, unspecified: Secondary | ICD-10-CM | POA: Diagnosis not present

## 2016-07-19 DIAGNOSIS — I10 Essential (primary) hypertension: Secondary | ICD-10-CM | POA: Diagnosis not present

## 2016-07-19 DIAGNOSIS — N390 Urinary tract infection, site not specified: Secondary | ICD-10-CM | POA: Diagnosis not present

## 2016-07-19 DIAGNOSIS — K219 Gastro-esophageal reflux disease without esophagitis: Secondary | ICD-10-CM | POA: Diagnosis not present

## 2016-07-25 ENCOUNTER — Ambulatory Visit: Payer: Medicare Other | Admitting: Podiatry

## 2016-07-26 ENCOUNTER — Ambulatory Visit: Payer: Medicare Other | Admitting: Podiatry

## 2016-07-26 ENCOUNTER — Ambulatory Visit: Payer: Medicare Other | Admitting: Emergency Medicine

## 2016-08-02 ENCOUNTER — Ambulatory Visit (INDEPENDENT_AMBULATORY_CARE_PROVIDER_SITE_OTHER): Payer: Medicare Other | Admitting: Pharmacist Clinician (PhC)/ Clinical Pharmacy Specialist

## 2016-08-02 DIAGNOSIS — Z5181 Encounter for therapeutic drug level monitoring: Secondary | ICD-10-CM | POA: Diagnosis not present

## 2016-08-02 DIAGNOSIS — R300 Dysuria: Secondary | ICD-10-CM | POA: Diagnosis not present

## 2016-08-02 DIAGNOSIS — I10 Essential (primary) hypertension: Secondary | ICD-10-CM | POA: Diagnosis not present

## 2016-08-02 DIAGNOSIS — Z7901 Long term (current) use of anticoagulants: Secondary | ICD-10-CM

## 2016-08-02 DIAGNOSIS — D6851 Activated protein C resistance: Secondary | ICD-10-CM | POA: Diagnosis not present

## 2016-08-02 LAB — POCT INR: INR: 2.8

## 2016-08-06 ENCOUNTER — Other Ambulatory Visit: Payer: Self-pay | Admitting: Cardiovascular Disease

## 2016-08-08 ENCOUNTER — Encounter: Payer: Self-pay | Admitting: Podiatry

## 2016-08-08 ENCOUNTER — Ambulatory Visit (INDEPENDENT_AMBULATORY_CARE_PROVIDER_SITE_OTHER): Payer: Medicare Other | Admitting: Podiatry

## 2016-08-08 DIAGNOSIS — L84 Corns and callosities: Secondary | ICD-10-CM

## 2016-08-08 DIAGNOSIS — M79676 Pain in unspecified toe(s): Secondary | ICD-10-CM

## 2016-08-08 DIAGNOSIS — M79671 Pain in right foot: Secondary | ICD-10-CM | POA: Diagnosis not present

## 2016-08-08 DIAGNOSIS — B351 Tinea unguium: Secondary | ICD-10-CM | POA: Diagnosis not present

## 2016-08-10 NOTE — Progress Notes (Signed)
Subjective: 83 y.o. returns the office today for painful, elongated, thickened toenails which she cannot trim herself. Denies any redness or drainage around the nails. She continues to get pain to the right fourth toe on the area of the callus. She denies he swelling oess or any drainage or pus. She also does have some concerns of her orthotics and pain to the top of her feet. She states that she feels the orthotics are helping but she still been some pain to the top of her feet which she does a lot of walking or standing. This is been ongoing for someand is been gradually worsening. She denies recent injury or trauma and she denies any increase in swelling ore denies any open sores. Denies any acute changes since last appointment and no new complaints today. Denies any systemic complaints such as fevers, chills, nausea, vomiting.   Objective: AAO 3, NAD DP/PT pulses palpable, CRT less than 3 seconds Nails hypertrophic, dystrophic, elongated, brittle, discolored 10. There is tenderness overlying the nails 1-5 bilaterally. There is no surrounding erythema or drainage along the nail sites. To the distal aspect of the right fourth digit there is a hyperkeratotic lesion with evidence of dried blood under the callus. Upon debridement there was no open sore identified at this time and there is no drainage or pus. There is no edema to the toe. No open lesions or pre-ulcerative lesions are identified. Decrease in medial arch height upon weightbearing. Mild discomfort bilaterally alon the mid tarsal joint. This is likely due to biomechanical in nature and is no specific area pinpoint bony tenderness or pain the vibratory sensation. There is no overlying edema, erythema, increase in warmth. Hammertoes are present No other areas of tenderness bilateral lower extremities. No overlying edema, erythema, increased warmth. No pain with calf compression, swelling, warmth, erythema.  Assessment: Patient presents with  symptomatic onychomycosis, pre-ulcerative callus, midfoot pain  Plan: -Treatment options including alternatives, risks, complications were discussed -Nails sharply debrided 10 without complication/bleeding. -Hyperkeratotic lesion was sharply debrided 1 without complications or bleeding -I had Rick evaluate the patient for her orthotics. We are redness in the orthotics back to add more cushion as well as a different top cover.  -Discussed daily foot inspection. If there are any changes, to call the office immediately.  -Follow-up in 3 months or sooner if any problems are to arise. In the meantime, encouraged to call the office with any questions, concerns, changes symptoms.  Celesta Gentile, DPM

## 2016-08-23 ENCOUNTER — Ambulatory Visit: Payer: Medicare Other | Admitting: Orthotics

## 2016-08-23 DIAGNOSIS — M2042 Other hammer toe(s) (acquired), left foot: Principal | ICD-10-CM

## 2016-08-23 DIAGNOSIS — M2041 Other hammer toe(s) (acquired), right foot: Secondary | ICD-10-CM

## 2016-08-29 ENCOUNTER — Other Ambulatory Visit: Payer: Self-pay | Admitting: Family Medicine

## 2016-08-29 DIAGNOSIS — Z1231 Encounter for screening mammogram for malignant neoplasm of breast: Secondary | ICD-10-CM

## 2016-08-30 ENCOUNTER — Ambulatory Visit (INDEPENDENT_AMBULATORY_CARE_PROVIDER_SITE_OTHER): Payer: Medicare Other | Admitting: Pharmacist

## 2016-08-30 DIAGNOSIS — Z7901 Long term (current) use of anticoagulants: Secondary | ICD-10-CM | POA: Diagnosis not present

## 2016-08-30 DIAGNOSIS — Z5181 Encounter for therapeutic drug level monitoring: Secondary | ICD-10-CM

## 2016-08-30 DIAGNOSIS — D6851 Activated protein C resistance: Secondary | ICD-10-CM | POA: Diagnosis not present

## 2016-08-30 LAB — POCT INR: INR: 2.9

## 2016-08-31 ENCOUNTER — Ambulatory Visit: Payer: Medicare Other | Admitting: *Deleted

## 2016-08-31 DIAGNOSIS — M79676 Pain in unspecified toe(s): Secondary | ICD-10-CM

## 2016-08-31 DIAGNOSIS — M722 Plantar fascial fibromatosis: Secondary | ICD-10-CM

## 2016-08-31 NOTE — Progress Notes (Signed)
Patient ID: Robin Harrell, female   DOB: 03-23-1933, 84 y.o.   MRN: [id number]   Patient presents for orthotic pick up.  Verbal and written break in and wear instructions given.  Patient will follow up in 4 weeks if symptoms worsen or fail to improve.

## 2016-08-31 NOTE — Patient Instructions (Signed)

## 2016-09-03 ENCOUNTER — Other Ambulatory Visit: Payer: Self-pay | Admitting: Emergency Medicine

## 2016-09-05 DIAGNOSIS — I10 Essential (primary) hypertension: Secondary | ICD-10-CM | POA: Diagnosis not present

## 2016-09-14 ENCOUNTER — Other Ambulatory Visit: Payer: Self-pay | Admitting: Emergency Medicine

## 2016-09-15 ENCOUNTER — Telehealth: Payer: Self-pay | Admitting: Cardiovascular Disease

## 2016-09-15 NOTE — Telephone Encounter (Signed)
Requesting surgical clearance:  1. Type of surgery: Colonoscopy--screening  2. Surgeon: Dr. Watt Climes  3.Surgical Date:11-15-16  4. Medications that need to be held: none   5. CAD: No  6. I will defer to:  Dr. Pearla Dubonnet Information:  [facility] Phone: [phone] Fax:  [phone]

## 2016-09-18 NOTE — Telephone Encounter (Signed)
Given her Factor 5 Leiden deficiency and H/O PE, will prob require Lovenox bridging

## 2016-09-19 NOTE — Telephone Encounter (Signed)
Pt to be bridged per protocol, previous PE with hx of Factor V Leiden.  Note sent to [facility] and will discuss with patient at her August INR check

## 2016-09-20 ENCOUNTER — Ambulatory Visit
Admission: RE | Admit: 2016-09-20 | Discharge: 2016-09-20 | Disposition: A | Discharge status: Home / Self Care | Payer: Medicare Other | Source: Ambulatory Visit | Attending: Family Medicine | Admitting: Family Medicine

## 2016-09-20 DIAGNOSIS — Z1231 Encounter for screening mammogram for malignant neoplasm of breast: Secondary | ICD-10-CM | POA: Diagnosis not present

## 2016-09-27 DIAGNOSIS — Z86010 Personal history of colonic polyps: Secondary | ICD-10-CM | POA: Diagnosis not present

## 2016-10-10 ENCOUNTER — Encounter: Payer: Self-pay | Admitting: Podiatry

## 2016-10-10 ENCOUNTER — Ambulatory Visit (INDEPENDENT_AMBULATORY_CARE_PROVIDER_SITE_OTHER): Payer: Medicare Other | Admitting: Podiatry

## 2016-10-10 DIAGNOSIS — M79676 Pain in unspecified toe(s): Secondary | ICD-10-CM

## 2016-10-10 DIAGNOSIS — L84 Corns and callosities: Secondary | ICD-10-CM | POA: Diagnosis not present

## 2016-10-10 DIAGNOSIS — B351 Tinea unguium: Secondary | ICD-10-CM

## 2016-10-10 DIAGNOSIS — Z86010 Personal history of colonic polyps: Secondary | ICD-10-CM | POA: Diagnosis not present

## 2016-10-11 ENCOUNTER — Ambulatory Visit (INDEPENDENT_AMBULATORY_CARE_PROVIDER_SITE_OTHER): Payer: Medicare Other | Admitting: Pharmacist

## 2016-10-11 DIAGNOSIS — Z7901 Long term (current) use of anticoagulants: Secondary | ICD-10-CM | POA: Diagnosis not present

## 2016-10-11 DIAGNOSIS — Z5181 Encounter for therapeutic drug level monitoring: Secondary | ICD-10-CM | POA: Diagnosis not present

## 2016-10-11 DIAGNOSIS — D6851 Activated protein C resistance: Secondary | ICD-10-CM | POA: Diagnosis not present

## 2016-10-11 LAB — POCT INR: INR: 3

## 2016-10-12 NOTE — Progress Notes (Signed)
Subjective: 83 y.o. returns the office today for painful, elongated, thickened toenails which she cannot trim herself. Denies any redness or drainage around the nails. She also gets a callus to the tip of the right 4th toe. She states she has been getting use to the inserts and they are more comfortable now. Denies any acute changes since last appointment and no new complaints today. Denies any systemic complaints such as fevers, chills, nausea, vomiting.   Objective: AAO 3, NAD DP/PT pulses palpable, CRT less than 3 seconds Nails hypertrophic, dystrophic, elongated, brittle, discolored 10. There is tenderness overlying the nails 1-5 bilaterally. There is no surrounding erythema or drainage along the nail sites. Hyperkeratotic lesion distal right 4th toe which is pre-ulcerative. No underlying ulceration, drainage, or signs of infection.  No open lesions or pre-ulcerative lesions are identified. No other areas of tenderness bilateral lower extremities. No overlying edema, erythema, increased warmth. No pain with calf compression, swelling, warmth, erythema.  Assessment: Patient presents with symptomatic onychomycosis; pre-ulcerative callus  Plan: -Treatment options including alternatives, risks, complications were discussed -Nails sharply debrided 10 without complication/bleeding. -Pre-ulcerative callus sharply debrided x 1 without any complications or bleeding.  -Discussed daily foot inspection. If there are any changes, to call the office immediately.  -Follow-up in 3 months or sooner if any problems are to arise. In the meantime, encouraged to call the office with any questions, concerns, changes symptoms.  Celesta Gentile, DPM

## 2016-10-21 ENCOUNTER — Ambulatory Visit (INDEPENDENT_AMBULATORY_CARE_PROVIDER_SITE_OTHER): Payer: Medicare Other | Admitting: Emergency Medicine

## 2016-10-21 ENCOUNTER — Encounter: Payer: Self-pay | Admitting: Emergency Medicine

## 2016-10-21 DIAGNOSIS — D6851 Activated protein C resistance: Secondary | ICD-10-CM

## 2016-10-21 DIAGNOSIS — Z23 Encounter for immunization: Secondary | ICD-10-CM | POA: Diagnosis not present

## 2016-10-21 DIAGNOSIS — R059 Cough, unspecified: Secondary | ICD-10-CM

## 2016-10-21 DIAGNOSIS — R05 Cough: Secondary | ICD-10-CM

## 2016-10-21 NOTE — Progress Notes (Signed)
Subjective:    Patient ID: Robin Harrell, female    DOB: [birth year], 83 y.o.   MRN: [id number]  Cough    83 yo female with COPD, Chronic rhinitis, chronic cough, and GERD, hiatal hernia. 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   ROV 06/16/15 -- patient with a history of allergic rhinitis, GERD, and associated chronic cough. She reports that her allergies have been bothersome, but that her cough has been better. She started atrovent nasal spray last time. Also using chlorpheniramine bid, zyrtec. She has more drainage in the middle of the day.  Has not required pred since last time. She will get choked on saliva or food some times. Overall she feels better than last time and her cough is better controlled.  ROV 02/05/16 -- this is a follow-up visit for chronic cough in the setting of allergic rhinitis and GERD. Also with a history of hiatal hernia, factor V Leiden deficiency with chronic PE. She has been doing fairly well. No GERD sx on omeprazole. She is on ipratropium nasal spray, zyrtec, chlorpheniramine twice a day. Minimal cough right now. Some hoarse voice.   ROV 10/21/16 -- Patient has a history of allergic rhinitis, GERD and associated chronic cough. Also chronic anticoagulation for pulmonary embolism and factor V Leiden deficiency. She returns today reporting that her cough has been much better. She is on zyrtec, chlorpheniramine bid. Has been using atrovent NS bid. She has rhinorrhea. No reflux sx, has remained on PPI    Review of Systems  Respiratory: Positive for cough.    As per HPI     Objective:   Physical Exam Vitals:   10/21/16 0854  BP: 120/70  Pulse: 71  SpO2: 100%  Weight: 134 lb (60.8 kg)  Height: 5\' 2"  (1.575 m)    Gen: Pleasant, well-nourished, in no distress,  normal affect  ENT: No lesions,  mouth clear,  oropharynx clear,   Neck: No JVD,   Lungs: No use of accessory muscles, clear without rales or rhonchi  Cardiovascular: RRR, heart sounds  normal, no murmur or gallops, no peripheral edema  Musculoskeletal: No deformities, no cyanosis or clubbing  Neuro: alert, non focal  Skin: Warm, no lesions or rashes   Current Outpatient Prescriptions:  .  atorvastatin (LIPITOR) 10 MG tablet, Take 10 mg by mouth daily at 6 PM., Disp: , Rfl:  .  brimonidine (ALPHAGAN P) 0.1 % SOLN, Place 1 drop into both eyes every 12 (twelve) hours.  , Disp: , Rfl:  .  brimonidine (ALPHAGAN) 0.15 % ophthalmic solution, , Disp: , Rfl:  .  calcium carbonate (TUMS - DOSED IN MG ELEMENTAL CALCIUM) 500 MG chewable tablet, Chew by mouth., Disp: , Rfl:  .  cetirizine (ZYRTEC) 10 MG tablet, TAKE 1 TABLET BY MOUTH EVERY DAY, Disp: 30 tablet, Rfl: 1 .  chlorpheniramine (CHLOR-TRIMETON) 4 MG tablet, Take 4 mg by mouth 2 (two) times daily as needed for allergies., Disp: , Rfl:  .  ipratropium (ATROVENT) 0.03 % nasal spray, Place 2 sprays into both nostrils 3 (three) times daily as needed for rhinitis., Disp: 30 mL, Rfl: 12 .  latanoprost (XALATAN) 0.005 % ophthalmic solution, Place 1 drop into both eyes at bedtime. , Disp: , Rfl:  .  levothyroxine (SYNTHROID, LEVOTHROID) 25 MCG tablet, Take 25 mcg by mouth daily before breakfast. , Disp: , Rfl:  .  losartan (COZAAR) 50 MG tablet, TAKE 1 TABLET BY MOUTH ONCE DAILY, Disp: , Rfl:  .  metoprolol tartrate (LOPRESSOR) 25 MG tablet, TAKE 1 TABLET BY MOUTH TWICE DAILY, Disp: 60 tablet, Rfl: 11 .  multivitamin-iron-minerals-folic acid (CENTRUM) chewable tablet, Chew 1 tablet by mouth daily., Disp: , Rfl:  .  omeprazole (PRILOSEC) 40 MG capsule, TAKE 1 CAPSULE (40 MG TOTAL) BY MOUTH DAILY., Disp: 30 capsule, Rfl: 1 .  warfarin (COUMADIN) 7.5 MG tablet, TAKE 1/2 TO 1 TABLET BY MOUTH DAILY AS DIRECTED BY COUMADIN CLINIC, Disp: 90 tablet, Rfl: 1      Assessment & Plan:   Factor 5 Leiden mutation, heterozygous ([place]) With history of DVT/PE. Planning for anticoagulation for life as tolerated  COUGH, CHRONIC Well-controlled at  this time on rhinitis and GERD regimens.  Please continue Zyrtec and Atrovent (ipratropium) nasal spray as you have been using them Continue chlorpheniramine for now Stop omeprazole. If you do not have any recurrence of your cough after 1 month then you can try stopping the chlorpheniramine. If your cough returns please call our office. We can then discuss restarting one or both of these medications. Flu shot today. Follow with Dr Lamonte Sakai in 6 months or sooner if you have any problems  Baltazar Apo, MD, PhD 10/21/2016, 9:13 AM [facility] [phone] or if no answer [phone]

## 2016-10-21 NOTE — Assessment & Plan Note (Signed)
Well-controlled at this time on rhinitis and GERD regimens.  Please continue Zyrtec and Atrovent (ipratropium) nasal spray as you have been using them Continue chlorpheniramine for now Stop omeprazole. If you do not have any recurrence of your cough after 1 month then you can try stopping the chlorpheniramine. If your cough returns please call our office. We can then discuss restarting one or both of these medications. Flu shot today. Follow with Dr Lamonte Sakai in 6 months or sooner if you have any problems

## 2016-10-21 NOTE — Assessment & Plan Note (Signed)
With history of DVT/PE. Planning for anticoagulation for life as tolerated

## 2016-10-21 NOTE — Patient Instructions (Signed)
Please continue Zyrtec and Atrovent (ipratropium) nasal spray as you have been using them Continue chlorpheniramine for now Stop omeprazole. If you do not have any recurrence of your cough after 1 month then you can try stopping the chlorpheniramine. If your cough returns please call our office. We can then discuss restarting one or both of these medications. Flu shot today. Follow with Dr Lamonte Sakai in 6 months or sooner if you have any problems

## 2016-11-06 ENCOUNTER — Other Ambulatory Visit: Payer: Self-pay | Admitting: Emergency Medicine

## 2016-11-22 ENCOUNTER — Ambulatory Visit (INDEPENDENT_AMBULATORY_CARE_PROVIDER_SITE_OTHER): Payer: Medicare Other | Admitting: Pharmacist Clinician (PhC)/ Clinical Pharmacy Specialist

## 2016-11-22 DIAGNOSIS — D6851 Activated protein C resistance: Secondary | ICD-10-CM

## 2016-11-22 DIAGNOSIS — Z5181 Encounter for therapeutic drug level monitoring: Secondary | ICD-10-CM

## 2016-11-22 DIAGNOSIS — Z7901 Long term (current) use of anticoagulants: Secondary | ICD-10-CM

## 2016-11-22 LAB — POCT INR: INR: 2.5

## 2016-12-05 DIAGNOSIS — H401131 Primary open-angle glaucoma, bilateral, mild stage: Secondary | ICD-10-CM | POA: Diagnosis not present

## 2016-12-05 DIAGNOSIS — H26492 Other secondary cataract, left eye: Secondary | ICD-10-CM | POA: Diagnosis not present

## 2016-12-30 ENCOUNTER — Ambulatory Visit (INDEPENDENT_AMBULATORY_CARE_PROVIDER_SITE_OTHER): Payer: Medicare Other | Admitting: Pharmacist

## 2016-12-30 DIAGNOSIS — Z5181 Encounter for therapeutic drug level monitoring: Secondary | ICD-10-CM

## 2016-12-30 DIAGNOSIS — Z7901 Long term (current) use of anticoagulants: Secondary | ICD-10-CM | POA: Diagnosis not present

## 2016-12-30 DIAGNOSIS — D6851 Activated protein C resistance: Secondary | ICD-10-CM | POA: Diagnosis not present

## 2017-01-05 ENCOUNTER — Other Ambulatory Visit: Payer: Self-pay | Admitting: Cardiovascular Disease

## 2017-01-16 ENCOUNTER — Ambulatory Visit (INDEPENDENT_AMBULATORY_CARE_PROVIDER_SITE_OTHER): Payer: Medicare Other | Admitting: Podiatry

## 2017-01-16 ENCOUNTER — Encounter: Payer: Self-pay | Admitting: Podiatry

## 2017-01-16 DIAGNOSIS — L84 Corns and callosities: Secondary | ICD-10-CM

## 2017-01-16 DIAGNOSIS — M79676 Pain in unspecified toe(s): Secondary | ICD-10-CM | POA: Diagnosis not present

## 2017-01-16 DIAGNOSIS — B351 Tinea unguium: Secondary | ICD-10-CM

## 2017-01-16 NOTE — Progress Notes (Signed)
Subjective: 83 y.o. returns the office today for painful, elongated, thickened toenails which she cannot trim herself. Denies any redness or drainage around the nails. She also gets a callus to the tip of the right 4th toe.  She is starting to develop pain to the balls of both of her feet which she does a lot of walking or standing.  She denies any recent injury or trauma.  No pain at rest.  Denies any acute changes since last appointment and no new complaints today. Denies any systemic complaints such as fevers, chills, nausea, vomiting.   Objective: AAO 3, NAD DP/PT pulses palpable, CRT less than 3 seconds Nails hypertrophic, dystrophic, elongated, brittle, discolored 10. There is tenderness overlying the nails 1-5 bilaterally. There is no surrounding erythema or drainage along the nail sites. Hyperkeratotic lesion distal right 4th toe which is pre-ulcerative. Hyperkeratotic lesion left foot submetatarsal 1.  No underlying ulceration, drainage, or signs of infection.  No open lesions or pre-ulcerative lesions are identified. No other areas of tenderness bilateral lower extremities. No overlying edema, erythema, increased warmth. Hammertoe contractures are present. No pain with calf compression, swelling, warmth, erythema.  Assessment: Patient presents with symptomatic onychomycosis; pre-ulcerative callus  Plan: -Treatment options including alternatives, risks, complications were discussed -Nails sharply debrided 10 without complication/bleeding. -Pre-ulcerative callus sharply debrided x 2 without any complications or bleeding.  -I added a metatarsal pad to the inserts to help take pressure off the metatarsals.  Discussed that if symptoms continue we can do a softer top cover for the insert and incorporate a metatarsal pad within the orthotic.  -Discussed daily foot inspection. If there are any changes, to call the office immediately.  -Follow-up in 3 months or sooner if any problems are  to arise. In the meantime, encouraged to call the office with any questions, concerns, changes symptoms.  Robin Harrell, DPM

## 2017-01-25 ENCOUNTER — Other Ambulatory Visit: Payer: Self-pay | Admitting: *Deleted

## 2017-01-25 ENCOUNTER — Encounter: Payer: Self-pay | Admitting: *Deleted

## 2017-01-26 ENCOUNTER — Ambulatory Visit (INDEPENDENT_AMBULATORY_CARE_PROVIDER_SITE_OTHER): Payer: Medicare Other | Admitting: Family Medicine

## 2017-01-26 ENCOUNTER — Encounter: Payer: Self-pay | Admitting: Family Medicine

## 2017-01-26 VITALS — BP 140/80 | HR 86 | Temp 98.1°F | Ht 62.0 in | Wt 136.2 lb

## 2017-01-26 DIAGNOSIS — R42 Dizziness and giddiness: Secondary | ICD-10-CM | POA: Diagnosis not present

## 2017-01-26 DIAGNOSIS — E039 Hypothyroidism, unspecified: Secondary | ICD-10-CM

## 2017-01-26 DIAGNOSIS — J302 Other seasonal allergic rhinitis: Secondary | ICD-10-CM

## 2017-01-26 DIAGNOSIS — I10 Essential (primary) hypertension: Secondary | ICD-10-CM | POA: Diagnosis not present

## 2017-01-26 DIAGNOSIS — R011 Cardiac murmur, unspecified: Secondary | ICD-10-CM

## 2017-01-26 DIAGNOSIS — J3089 Other allergic rhinitis: Secondary | ICD-10-CM | POA: Diagnosis not present

## 2017-01-26 DIAGNOSIS — Z87440 Personal history of urinary (tract) infections: Secondary | ICD-10-CM | POA: Insufficient documentation

## 2017-01-26 DIAGNOSIS — M810 Age-related osteoporosis without current pathological fracture: Secondary | ICD-10-CM

## 2017-01-26 DIAGNOSIS — R05 Cough: Secondary | ICD-10-CM

## 2017-01-26 DIAGNOSIS — R059 Cough, unspecified: Secondary | ICD-10-CM

## 2017-01-26 DIAGNOSIS — Z7901 Long term (current) use of anticoagulants: Secondary | ICD-10-CM | POA: Diagnosis not present

## 2017-01-26 LAB — COMPREHENSIVE METABOLIC PANEL
ALBUMIN: 4.4 g/dL (ref 3.5–5.2)
ALK PHOS: 63 U/L (ref 39–117)
ALT: 15 U/L (ref 0–35)
AST: 21 U/L (ref 0–37)
BUN: 24 mg/dL — AB (ref 6–23)
CHLORIDE: 103 meq/L (ref 96–112)
CO2: 30 mEq/L (ref 19–32)
CREATININE: 0.91 mg/dL (ref 0.40–1.20)
Calcium: 9.3 mg/dL (ref 8.4–10.5)
GFR: 63.06 mL/min (ref >60.00)
GLUCOSE: 94 mg/dL (ref 70–99)
POTASSIUM: 4 meq/L (ref 3.5–5.1)
SODIUM: 140 meq/L (ref 135–145)
TOTAL PROTEIN: 6.8 g/dL (ref 6.0–8.3)
Total Bilirubin: 0.9 mg/dL (ref 0.2–1.2)

## 2017-01-26 LAB — CBC WITH DIFFERENTIAL/PLATELET
BASOS ABS: 0 10*3/uL (ref 0.0–0.1)
Basophils Relative: 1 % (ref 0.0–3.0)
EOS ABS: 0.1 10*3/uL (ref 0.0–0.7)
Eosinophils Relative: 2.1 % (ref 0.0–5.0)
HEMATOCRIT: 39.5 % (ref 36.0–46.0)
HEMOGLOBIN: 12.9 g/dL (ref 12.0–15.0)
LYMPHS PCT: 27.8 % (ref 12.0–46.0)
Lymphs Abs: 1.4 10*3/uL (ref 0.7–4.0)
MCHC: 32.7 g/dL (ref 30.0–36.0)
MCV: 95.6 fl (ref 78.0–100.0)
MONO ABS: 0.6 10*3/uL (ref 0.1–1.0)
Monocytes Relative: 12.5 % — ABNORMAL HIGH (ref 3.0–12.0)
Neutro Abs: 2.8 10*3/uL (ref 1.4–7.7)
Neutrophils Relative %: 56.6 % (ref 43.0–77.0)
Platelets: 146 10*3/uL — ABNORMAL LOW (ref 150.0–400.0)
RBC: 4.13 Mil/uL (ref 3.87–5.11)
RDW: 13.6 % (ref 11.5–15.5)
WBC: 5 10*3/uL (ref 4.0–10.5)

## 2017-01-26 LAB — TSH: TSH: 2 u[IU]/mL (ref 0.35–4.50)

## 2017-01-26 MED ORDER — FLUTICASONE PROPIONATE 50 MCG/ACT NA SUSP: 2.0000 | Freq: Every day | NASAL | 6 refills | Status: DC

## 2017-01-26 NOTE — Progress Notes (Signed)
Please call patient: I have reviewed his/her lab results. All labs are stable

## 2017-01-26 NOTE — Progress Notes (Signed)
Subjective  CC:  Chief Complaint  Patient presents with  . Hypertension  . Hypothyroidism    HPI: Robin Harrell is a 83 y.o. female who presents to the office today to address the problems listed above in the chief complaint. Former [facility] [facility] patient; also here for c/o presyncope.  Hypertension f/u: Control is good . Pt reports she is doing well. taking medications as instructed, no medication side effects noted, patient does not perform home BP monitoring, no TIAs, no chest pain on exertion, no dyspnea on exertion, no swelling of ankles, no palpitations. However with 4-5 presyncopal episodes. See below. She denies adverse effects from his BP medications. Compliance with medication is good. Labs 05/2016 with well controlled lipids, nl renal and electrolytes  Presyncope: Patient reports 4-5 episodes of the acute feeling of warmth, lightheadedness, weakness and feeling like she would pass out.  This happened several times after hot shower.  Most recently it happened while sitting at a funeral.  She denies palpitations or chest pain.  No shortness of breath.  After 10-15 minutes of sitting or resting, she feels better.  She does have history of heart murmurs, history of silent MI with normal stress testing in 2009.  She is on anticoagulants but denies melena or obvious bleeding.  Her energy level has been good.  Hypothyroidism follow-up: Last TSH was at goal.  Takes medicines daily.  Chronic cough and allergic rhinitis followed by pulmonary: Chronic cough is much improved.  She is no longer on PPI and is not having GERD symptoms or cough.  Continues on antihistamines but complains of worsening rhinorrhea and postnasal drainage.  Osteoporosis follow-up: She is due for her Prolia injection.  Last given by her report in June.  Last documented November 2017.  Health maintenance: She does need her second shingles vaccination.  BP Readings from Last 3 Encounters:  01/26/17 140/80  10/21/16 120/70    03/26/16 133/62   Wt Readings from Last 3 Encounters:  01/26/17 136 lb 4 oz (61.8 kg)  10/21/16 134 lb (60.8 kg)  03/26/16 133 lb 8 oz (60.6 kg)  I reviewed the patients updated PMH, FH, and SocHx.    Patient Active Problem List   Diagnosis Date Noted  . Hypothyroidism (acquired) 09/02/2014    Priority: High  . Hyperlipidemia 11/19/2012    Priority: High  . Long term (current) use of anticoagulants 05/10/2012    Priority: High  . Diastolic dysfunction 50/27/7412    Priority: High  . Factor V deficiency ([place]) 02/09/2010    Priority: High  . Osteoporosis 02/09/2010    Priority: High  . Essential hypertension 11/15/2007    Priority: High  . Chronic idiopathic thrombocytopenia ([facility]) 04/14/2016    Priority: Medium  . Essential tremor 09/03/2015    Priority: Medium  . Edema of right lower extremity 02/01/2013    Priority: Medium  . Colon polyp 10/15/2012    Priority: Medium  . Insomnia 10/11/2011    Priority: Medium  . Osteoarthritis, multiple sites 06/30/2011    Priority: Medium  . DJD (degenerative joint disease), lumbar 10/14/2009    Priority: Medium  . Glaucoma 01/29/2008    Priority: Medium  . History of DVT (deep vein thrombosis) 11/15/2007    Priority: Medium  . GERD 11/15/2007    Priority: Medium  . COUGH, CHRONIC 11/15/2007    Priority: Medium  . History of recurrent UTIs 01/26/2017    Priority: Low  . History of respiratory failure 11/11/2014    Priority:  Low  . Seasonal and perennial allergic rhinitis 11/15/2007    Priority: Low    Allergies: Codeine and Erythromycin  Social History: Patient  reports that she quit smoking about 58 years ago. Her smoking use included cigarettes. She quit after 1.00 year of use. she has never used smokeless tobacco. She reports that she drinks alcohol. She reports that she does not use drugs.  Current Meds  Medication Sig  . ipratropium (ATROVENT) 0.03 % nasal spray Place 2 sprays into both nostrils 3 (three) times  daily as needed for rhinitis.    Review of Systems: Cardiovascular: negative for chest pain, palpitations, leg swelling, orthopnea Respiratory: negative for SOB, wheezing or persistent cough Gastrointestinal: negative for abdominal pain Genitourinary: negative for dysuria or gross hematuria  Objective  Vitals: BP 140/80 (BP Location: Left Arm, Patient Position: Sitting, Cuff Size: Normal)   Pulse 86   Temp 98.1 F (36.7 C) (Oral)   Ht 5\' 2"  (1.575 m)   Wt 136 lb 4 oz (61.8 kg)   LMP  (LMP Unknown)   SpO2 100%   BMI 24.92 kg/m  General: no acute distress . kyphotic Psych:  Alert and oriented, normal mood and affect HEENT:  Normocephalic, atraumatic, supple neck , nasal mucosa and tms are normal Cardiovascular:  RRR with 2/6 systolic murmur at LUSB and blowing murmur at apex, no edema Respiratory:  Good breath sounds bilaterally, CTAB with normal respiratory effort Skin:  Warm, no rashes Neurologic:   Mental status is normal, essential tremor present Orthostatic bps are negative with stable hr  Assessment  1. Essential hypertension   2. Episodic lightheadedness   3. Hypothyroidism (acquired)   4. Long term (current) use of anticoagulants   5. Age-related osteoporosis without current pathological fracture   6. COUGH, CHRONIC   7. Seasonal and perennial allergic rhinitis   8. Systolic murmur      Plan    Hypertension f/u: BP control is fairly well controlled. In setting of pre-syncope, am concerned about low bps. Pt to restart home monitoring. Check cmp. Discussed fall prevention and good hydration.  Presyncope: DDX includes AS, orthostatic hypotension, autonomic dysfunction or other.  Check TSH CMP CBC to rule out anemia.  Check echocardiogram to evaluate valvular disease.  Education given.  Follow-up pending test results.  Hyperlipidemia f/u: Stable on statin.  Hypothyroidism follow-up: Recheck TSH.  Osteoporosis: Will preauthorize for Prolia injection.  Call for next  appointment.  Bone density up-to-date.  Recheck in 2019  Chronic cough and allergies: Mostly improved.  Add back Flonase due to increased rhinorrhea.  Health maintenance follow-up: Due for annual wellness visit in April 2019. Education regarding management of these chronic disease states was given. Management strategies discussed on successive visits include dietary and exercise recommendations, goals of achieving and maintaining IBW, and lifestyle modifications aiming for adequate sleep and minimizing stressors.   Follow up: pending workup; AWV in April 2019, cpe in 6 months, prolia when available   Commons side effects, risks, benefits, and alternatives for medications and treatment plan prescribed today were discussed, and the patient expressed understanding of the given instructions. Patient is instructed to call or message via MyChart if he/she has any questions or concerns regarding our treatment plan. No barriers to understanding were identified. We discussed Red Flag symptoms and signs in detail. Patient expressed understanding regarding what to do in case of urgent or emergency type symptoms.   Medication list was reconciled, printed and provided to the patient in AVS. Patient instructions  and summary information was reviewed with the patient as documented in the AVS. This note was prepared with assistance of Dragon voice recognition software. Occasional wrong-word or sound-a-like substitutions may have occurred due to the inherent limitations of voice recognition software  Orders Placed This Encounter  Procedures  . CBC with Differential/Platelet  . TSH  . Comprehensive metabolic panel  . ECHOCARDIOGRAM COMPLETE   Meds ordered this encounter  Medications  . fluticasone (FLONASE) 50 MCG/ACT nasal spray    Sig: Place 2 sprays into both nostrils daily.    Dispense:  16 g    Refill:  6

## 2017-01-26 NOTE — Patient Instructions (Addendum)
It was so good seeing you again! Thank you for establishing with my new practice and allowing me to continue caring for you. It means a lot to me.  I will let you know your blood test results. I'd like you to go get an echocardiogram to evaluate your valves due to your symptoms of lightheadedness. We will call you with an appointment.  Dizziness Dizziness is a common problem. It makes you feel unsteady or light-headed. You may feel like you are about to pass out (faint). Dizziness can lead to getting hurt if you stumble or fall. Dizziness can be caused by many things, including:  Medicines.  Not having enough water in your body (dehydration).  Illness.  Follow these instructions at home: Eating and drinking  Drink enough fluid to keep your pee (urine) clear or pale yellow. This helps to keep you from getting dehydrated. Try to drink more clear fluids, such as water.  Do not drink alcohol.  Limit how much caffeine you drink or eat, if your doctor tells you to do that.  Limit how much salt (sodium) you drink or eat, if your doctor tells you to do that. Activity  Avoid making quick movements. ? When you stand up from sitting in a chair, steady yourself until you feel okay. ? In the morning, first sit up on the side of the bed. When you feel okay, stand slowly while you hold onto something. Do this until you know that your balance is fine.  If you need to stand in one place for a long time, move your legs often. Tighten and relax the muscles in your legs while you are standing.  Do not drive or use heavy machinery if you feel dizzy.  Avoid bending down if you feel dizzy. Place items in your home so you can reach them easily without leaning over. Lifestyle  Do not use any products that contain nicotine or tobacco, such as cigarettes and e-cigarettes. If you need help quitting, ask your doctor.  Try to lower your stress level. You can do this by using methods such as yoga or meditation.  Talk with your doctor if you need help. General instructions  Watch your dizziness for any changes.  Take over-the-counter and prescription medicines only as told by your doctor. Talk with your doctor if you think that you are dizzy because of a medicine that you are taking.  Tell a friend or a family member that you are feeling dizzy. If he or she notices any changes in your behavior, have this person call your doctor.  Keep all follow-up visits as told by your doctor. This is important. Contact a doctor if:  Your dizziness does not go away.  Your dizziness or light-headedness gets worse.  You feel sick to your stomach (nauseous).  You have trouble hearing.  You have new symptoms.  You are unsteady on your feet.  You feel like the room is spinning. Get help right away if:  You throw up (vomit) or have watery poop (diarrhea), and you cannot eat or drink anything.  You have trouble: ? Talking. ? Walking. ? Swallowing. ? Using your arms, hands, or legs.  You feel generally weak.  You are not thinking clearly, or you have trouble forming sentences. A friend or family member may notice this.  You have: ? Chest pain. ? Pain in your belly (abdomen). ? Shortness of breath. ? Sweating.  Your vision changes.  You are bleeding.  You have a very  bad headache.  You have neck pain or a stiff neck.  You have a fever. These symptoms may be an emergency. Do not wait to see if the symptoms will go away. Get medical help right away. Call your local emergency services (911 in the [place] Do not drive yourself to the hospital. Summary  Dizziness makes you feel unsteady or light-headed. You may feel like you are about to pass out (faint).  Drink enough fluid to keep your pee (urine) clear or pale yellow. Do not drink alcohol.  Avoid making quick movements if you feel dizzy.  Watch your dizziness for any changes. This information is not intended to replace advice given to you  by your health care provider. Make sure you discuss any questions you have with your health care provider. Document Released: 01/27/2011 Document Revised: 02/25/2016 Document Reviewed: 02/25/2016 Elsevier Interactive Patient Education  2017 [place].   Please schedule a follow up appointment with me in 6 months for your complete physical with lab.   Medicare recommends an Annual Wellness Visit for all patients. Please schedule this to be done with our Nurse Educator, Maudie Mercury for April 2019. This is an informative "talk" visit; it's goals are to ensure that your health care needs are being met and to give you education regarding avoiding falls, ensuring you are not suffering from depression or problems with memory or thinking, and to educate you on Advance Care Planning. It helps me take good care of you!

## 2017-02-02 ENCOUNTER — Other Ambulatory Visit (HOSPITAL_COMMUNITY): Payer: Medicare Other

## 2017-02-03 ENCOUNTER — Other Ambulatory Visit: Payer: Self-pay | Admitting: *Deleted

## 2017-02-03 MED ORDER — METOPROLOL TARTRATE 25 MG PO TABS: 25.0000 mg | ORAL_TABLET | Freq: Two times a day (BID) | ORAL | 0 refills | Status: DC

## 2017-02-07 ENCOUNTER — Other Ambulatory Visit: Payer: Self-pay

## 2017-02-07 ENCOUNTER — Ambulatory Visit (HOSPITAL_COMMUNITY): Payer: Medicare Other | Attending: Cardiology

## 2017-02-07 ENCOUNTER — Encounter: Payer: Self-pay | Admitting: Family Medicine

## 2017-02-07 DIAGNOSIS — R011 Cardiac murmur, unspecified: Secondary | ICD-10-CM | POA: Diagnosis not present

## 2017-02-07 DIAGNOSIS — Z86711 Personal history of pulmonary embolism: Secondary | ICD-10-CM | POA: Insufficient documentation

## 2017-02-07 DIAGNOSIS — Z86718 Personal history of other venous thrombosis and embolism: Secondary | ICD-10-CM | POA: Insufficient documentation

## 2017-02-07 DIAGNOSIS — I350 Nonrheumatic aortic (valve) stenosis: Secondary | ICD-10-CM | POA: Insufficient documentation

## 2017-02-07 DIAGNOSIS — I119 Hypertensive heart disease without heart failure: Secondary | ICD-10-CM | POA: Diagnosis not present

## 2017-02-07 DIAGNOSIS — I493 Ventricular premature depolarization: Secondary | ICD-10-CM | POA: Diagnosis not present

## 2017-02-07 DIAGNOSIS — E785 Hyperlipidemia, unspecified: Secondary | ICD-10-CM | POA: Insufficient documentation

## 2017-02-07 DIAGNOSIS — Z87891 Personal history of nicotine dependence: Secondary | ICD-10-CM | POA: Insufficient documentation

## 2017-02-07 DIAGNOSIS — R42 Dizziness and giddiness: Secondary | ICD-10-CM

## 2017-02-07 DIAGNOSIS — I080 Rheumatic disorders of both mitral and aortic valves: Secondary | ICD-10-CM | POA: Insufficient documentation

## 2017-02-07 DIAGNOSIS — Z8249 Family history of ischemic heart disease and other diseases of the circulatory system: Secondary | ICD-10-CM | POA: Diagnosis not present

## 2017-02-07 NOTE — Progress Notes (Signed)
Please call patient: I have reviewed his/her echocardiogram results. Good news: her heart is functioning well w/o new changes - she does have some narrowing of the aortic valve but it is mild and should not be causing her symptoms. There is no need for further cardiac evaluation at this time. F/u with me IF her sxs recur or worsen.   Echo: nl wall motion, EF 60%, mild AS, mild MR, moderate diastolic dysfunction

## 2017-02-08 ENCOUNTER — Ambulatory Visit (INDEPENDENT_AMBULATORY_CARE_PROVIDER_SITE_OTHER): Payer: Medicare Other | Admitting: Pharmacist

## 2017-02-08 DIAGNOSIS — Z5181 Encounter for therapeutic drug level monitoring: Secondary | ICD-10-CM | POA: Diagnosis not present

## 2017-02-08 DIAGNOSIS — D6851 Activated protein C resistance: Secondary | ICD-10-CM | POA: Diagnosis not present

## 2017-02-08 DIAGNOSIS — Z7901 Long term (current) use of anticoagulants: Secondary | ICD-10-CM

## 2017-02-08 LAB — POCT INR: INR: 2.7

## 2017-02-17 ENCOUNTER — Telehealth: Payer: Self-pay | Admitting: Podiatry

## 2017-02-17 NOTE — Telephone Encounter (Signed)
Dr. Berton Lan pt for 6 month handicap sticker.

## 2017-02-27 ENCOUNTER — Encounter: Payer: Self-pay | Admitting: Emergency Medicine

## 2017-02-27 ENCOUNTER — Ambulatory Visit (INDEPENDENT_AMBULATORY_CARE_PROVIDER_SITE_OTHER): Payer: Medicare Other | Admitting: Emergency Medicine

## 2017-02-27 DIAGNOSIS — J3089 Other allergic rhinitis: Secondary | ICD-10-CM

## 2017-02-27 DIAGNOSIS — K219 Gastro-esophageal reflux disease without esophagitis: Secondary | ICD-10-CM | POA: Diagnosis not present

## 2017-02-27 DIAGNOSIS — J302 Other seasonal allergic rhinitis: Secondary | ICD-10-CM | POA: Diagnosis not present

## 2017-02-27 DIAGNOSIS — R05 Cough: Secondary | ICD-10-CM

## 2017-02-27 DIAGNOSIS — R059 Cough, unspecified: Secondary | ICD-10-CM

## 2017-02-27 MED ORDER — OMEPRAZOLE 20 MG PO CPDR: 20.0000 mg | DELAYED_RELEASE_CAPSULE | Freq: Every day | ORAL | 0 refills | Status: DC

## 2017-02-27 NOTE — Patient Instructions (Addendum)
We will restart omeprazole 20mg  daily for the next 3 weeks. It your cough improves then you can stop it at that time.  Try starting mucinex 600 mg twice a day for the next 2 weeks to see if this helps with your  Please be better about practicing your swallowing precautions as directed by Speech Pathology Please continue your chlorpheniramine, Zyrtec, nasal saline washes, fluticasone nasal spray as you have been taking them Follow-up in February as already planned

## 2017-02-27 NOTE — Assessment & Plan Note (Signed)
We will restart omeprazole 20mg  daily for the next 3 weeks. It your cough improves then you can stop it at that time.

## 2017-02-27 NOTE — Assessment & Plan Note (Signed)
Seem to flare when she ran out of her ipratropium nasal spray.  She has not fully resolved the flare yet.  I like to temporarily add back omeprazole.  Hopefully we can stop it again once she is stable.  Also add Mucinex given the thick secretions that she is trying to clear.  Continue her other maintenance therapy including adding back the ipratropium.  She has known aspiration and has breakthrough symptoms when she is not good about doing her swallowing precautions  We will restart omeprazole 20mg  daily for the next 3 weeks. It your cough improves then you can stop it at that time.  Try starting mucinex 600 mg twice a day for the next 2 weeks to see if this helps with your  Please be better about practicing your swallowing precautions as directed by Speech Pathology Please continue your chlorpheniramine, Zyrtec, nasal saline washes, fluticasone nasal spray as you have been taking them Follow-up in February as already planned

## 2017-02-27 NOTE — Progress Notes (Signed)
Subjective:    Patient ID: Robin Harrell, female    DOB: April 13, 1933, 84 y.o.   MRN: [id number]  Cough    84 yo female with COPD, Chronic rhinitis, chronic cough, and GERD, hiatal hernia. 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   ROV 06/16/15 -- patient with a history of allergic rhinitis, GERD, and associated chronic cough. She reports that her allergies have been bothersome, but that her cough has been better. She started atrovent nasal spray last time. Also using chlorpheniramine bid, zyrtec. She has more drainage in the middle of the day.  Has not required pred since last time. She will get choked on saliva or food some times. Overall she feels better than last time and her cough is better controlled.  ROV 02/05/16 -- this is a follow-up visit for chronic cough in the setting of allergic rhinitis and GERD. Also with a history of hiatal hernia, factor V Leiden deficiency with chronic PE. She has been doing fairly well. No GERD sx on omeprazole. She is on ipratropium nasal spray, zyrtec, chlorpheniramine twice a day. Minimal cough right now. Some hoarse voice.   ROV 10/21/16 -- Patient has a history of allergic rhinitis, GERD and associated chronic cough. Also chronic anticoagulation for pulmonary embolism and factor V Leiden deficiency. She returns today reporting that her cough has been much better. She is on zyrtec, chlorpheniramine bid. Has been using atrovent NS bid. She has rhinorrhea. No reflux sx, has remained on PPI  ROV 02/27/17 --84 year old woman with chronic allergic rhinitis, GERD and associated chronic cough.  Normal spirometry in 2010.  She also has a history of hiatal hernia, chronic pulmonary embolism and factor V Leiden deficiency.  She has been managed on zyrtec, chlorpheniramine bid, flonase, ipratropium NS. She had been well controlled lately until she lost her ipratropium NS. She has restarted it. She is off omeprazole.  She uses [place] prn, frequently lately. She is  having persistent cough, thick mucous in the am. She notes some choking on her meds. She has some aspiration sx that are happening frequently - doesn't always do her swallowing precautions.    Review of Systems  Respiratory: Positive for cough.    As per HPI     Objective:   Physical Exam Vitals:   02/27/17 1013  BP: 134/60  Pulse: 95  SpO2: 95%  Weight: 136 lb 9.6 oz (62 kg)  Height: 5\' 2"  (1.575 m)    Gen: Pleasant, well-nourished, in no distress,  normal affect  ENT: No lesions,  mouth clear,  oropharynx clear,   Neck: No JVD,   Lungs: No use of accessory muscles, clear without rales or rhonchi  Cardiovascular: RRR, heart sounds normal, no murmur or gallops, no peripheral edema  Musculoskeletal: No deformities, no cyanosis or clubbing  Neuro: alert, non focal  Skin: Warm, no lesions or rashes   Current Outpatient Medications:  .  atorvastatin (LIPITOR) 10 MG tablet, Take 10 mg by mouth daily at 6 PM., Disp: , Rfl:  .  brimonidine (ALPHAGAN P) 0.1 % SOLN, Place 1 drop into both eyes every 12 (twelve) hours.  , Disp: , Rfl:  .  calcium carbonate (TUMS - DOSED IN MG ELEMENTAL CALCIUM) 500 MG chewable tablet, Chew by mouth., Disp: , Rfl:  .  cetirizine (ZYRTEC) 10 MG tablet, TAKE 1 TABLET BY MOUTH EVERY DAY, Disp: 30 tablet, Rfl: 5 .  chlorpheniramine (CHLOR-TRIMETON) 4 MG tablet, Take by mouth., Disp: , Rfl:  .  Cholecalciferol (VITAMIN D3) 2000 units TABS, Take 1 tablet by mouth daily. , Disp: , Rfl:  .  fluticasone (FLONASE) 50 MCG/ACT nasal spray, Place 2 sprays into both nostrils daily., Disp: 16 g, Rfl: 6 .  ipratropium (ATROVENT) 0.03 % nasal spray, Place 2 sprays into both nostrils 3 (three) times daily as needed for rhinitis., Disp: 30 mL, Rfl: 12 .  latanoprost (XALATAN) 0.005 % ophthalmic solution, Place 1 drop into both eyes at bedtime. , Disp: , Rfl:  .  levothyroxine (SYNTHROID, LEVOTHROID) 25 MCG tablet, Take 25 mcg by mouth daily before breakfast. , Disp:  , Rfl:  .  losartan (COZAAR) 50 MG tablet, TAKE 1 TABLET BY MOUTH ONCE DAILY, Disp: , Rfl:  .  metoprolol tartrate (LOPRESSOR) 25 MG tablet, Take 1 tablet (25 mg total) by mouth 2 (two) times daily. KEEP OV., Disp: 180 tablet, Rfl: 0 .  Multiple Vitamin (MULTIVITAMIN) tablet, Take by mouth., Disp: , Rfl:  .  multivitamin-iron-minerals-folic acid (CENTRUM) chewable tablet, Chew 1 tablet by mouth daily., Disp: , Rfl:  .  warfarin (COUMADIN) 7.5 MG tablet, TAKE 1/2 TO 1 TABLET BY MOUTH DAILY AS DIRECTED BY COUMADIN CLINIC (Patient taking differently: TAKE 1/2 Mon,Weds,Fri,and whole pill Sun,Tues,Thurs,Sat), Disp: 90 tablet, Rfl: 1      Assessment & Plan:   COUGH, CHRONIC Seem to flare when she ran out of her ipratropium nasal spray.  She has not fully resolved the flare yet.  I like to temporarily add back omeprazole.  Hopefully we can stop it again once she is stable.  Also add Mucinex given the thick secretions that she is trying to clear.  Continue her other maintenance therapy including adding back the ipratropium.  She has known aspiration and has breakthrough symptoms when she is not good about doing her swallowing precautions  We will restart omeprazole 20mg  daily for the next 3 weeks. It your cough improves then you can stop it at that time.  Try starting mucinex 600 mg twice a day for the next 2 weeks to see if this helps with your  Please be better about practicing your swallowing precautions as directed by Speech Pathology Please continue your chlorpheniramine, Zyrtec, nasal saline washes, fluticasone nasal spray as you have been taking them Follow-up in February as already planned  Seasonal and perennial allergic rhinitis Try starting mucinex 600 mg twice a day for the next 2 weeks to see if this helps with your  Please continue your chlorpheniramine, Zyrtec, nasal saline washes, fluticasone nasal spray as you have been taking them Follow-up in February as already planned  GERD We  will restart omeprazole 20mg  daily for the next 3 weeks. It your cough improves then you can stop it at that time.   Baltazar Apo, MD, PhD 02/27/2017, 10:43 AM [facility] [phone] or if no answer [phone]

## 2017-02-27 NOTE — Assessment & Plan Note (Signed)
Try starting mucinex 600 mg twice a day for the next 2 weeks to see if this helps with your  Please continue your chlorpheniramine, Zyrtec, nasal saline washes, fluticasone nasal spray as you have been taking them Follow-up in February as already planned

## 2017-02-28 ENCOUNTER — Telehealth: Payer: Self-pay | Admitting: Podiatry

## 2017-02-28 NOTE — Telephone Encounter (Signed)
Please give her 6 months.

## 2017-02-28 NOTE — Telephone Encounter (Signed)
I'm calling because I left some paperwork at the office on 17 February 2017 for Dr. Jacqualyn Posey to fill out for me to get a permanent handicap placard and I have not heard anything back. If his nurse Lattie Haw would please call me back at [phone].

## 2017-03-01 NOTE — Telephone Encounter (Signed)
Routed message to [facility] receptionist, to complete and call pt.

## 2017-03-16 ENCOUNTER — Other Ambulatory Visit: Payer: Self-pay | Admitting: Pharmacist

## 2017-03-16 MED ORDER — WARFARIN SODIUM 7.5 MG PO TABS: ORAL_TABLET | ORAL | 0 refills | Status: DC

## 2017-03-17 ENCOUNTER — Telehealth: Payer: Self-pay | Admitting: *Deleted

## 2017-03-17 NOTE — Telephone Encounter (Signed)
Error

## 2017-03-20 ENCOUNTER — Ambulatory Visit (INDEPENDENT_AMBULATORY_CARE_PROVIDER_SITE_OTHER): Payer: Medicare Other | Admitting: Podiatry

## 2017-03-20 ENCOUNTER — Encounter: Payer: Self-pay | Admitting: Podiatry

## 2017-03-20 DIAGNOSIS — L84 Corns and callosities: Secondary | ICD-10-CM

## 2017-03-20 DIAGNOSIS — B351 Tinea unguium: Secondary | ICD-10-CM

## 2017-03-20 DIAGNOSIS — M79676 Pain in unspecified toe(s): Secondary | ICD-10-CM

## 2017-03-20 DIAGNOSIS — Z9181 History of falling: Secondary | ICD-10-CM

## 2017-03-20 DIAGNOSIS — D689 Coagulation defect, unspecified: Secondary | ICD-10-CM

## 2017-03-21 ENCOUNTER — Ambulatory Visit (INDEPENDENT_AMBULATORY_CARE_PROVIDER_SITE_OTHER): Payer: Medicare Other | Admitting: *Deleted

## 2017-03-21 DIAGNOSIS — M810 Age-related osteoporosis without current pathological fracture: Secondary | ICD-10-CM

## 2017-03-21 DIAGNOSIS — M159 Polyosteoarthritis, unspecified: Secondary | ICD-10-CM

## 2017-03-21 MED ORDER — DENOSUMAB 60 MG/ML ~~LOC~~ SOLN
60.0000 mg | Freq: Once | SUBCUTANEOUS | Status: AC
  Administered 2017-03-21: 60 mg via SUBCUTANEOUS

## 2017-03-21 NOTE — Progress Notes (Addendum)
Reviewed and agree/ cla. Patient in for prolia injection.   Injection given, patient tolerated well.  Aware she will be due for another shot around  09/18/17

## 2017-03-22 ENCOUNTER — Ambulatory Visit (INDEPENDENT_AMBULATORY_CARE_PROVIDER_SITE_OTHER): Payer: Medicare Other | Admitting: Pharmacist Clinician (PhC)/ Clinical Pharmacy Specialist

## 2017-03-22 DIAGNOSIS — Z5181 Encounter for therapeutic drug level monitoring: Secondary | ICD-10-CM

## 2017-03-22 DIAGNOSIS — Z7901 Long term (current) use of anticoagulants: Secondary | ICD-10-CM

## 2017-03-22 DIAGNOSIS — D6851 Activated protein C resistance: Secondary | ICD-10-CM

## 2017-03-22 LAB — POCT INR: INR: 2.7

## 2017-03-22 NOTE — Patient Instructions (Signed)
Description   Continue taking 1 tablet daily except for 1/2 tablet each Mon,We & Fr. Repeat INR in 6 weeks.

## 2017-03-22 NOTE — Progress Notes (Signed)
Subjective: 84 y.o. returns the office today for painful, elongated, thickened toenails which she cannot trim herself.  She also has a painful corn to the right fourth toe.  Denies any redness or drainage from the nails or callus sites.  No recent injury or any increase in swelling.  She has no other concerns today.  She also states that she has a fear of falling.  No recent falls or injury.  She feels unsteady.  Denies any acute changes since last appointment and no new complaints today. Denies any systemic complaints such as fevers, chills, nausea, vomiting.   She is on Coumadin  Objective: AAO 3, NAD DP/PT pulses palpable, CRT less than 3 seconds Nails hypertrophic, dystrophic, elongated, brittle, discolored 10. There is tenderness overlying the nails 1-5 bilaterally. There is no surrounding erythema or drainage along the nail sites. Hyperkeratotic lesion distal right 4th toe which is pre-ulcerative. No open lesions or pre-ulcerative lesions are identified. No other areas of tenderness bilateral lower extremities. No overlying edema, erythema, increased warmth. Hammertoe contractures are present. No pain with calf compression, swelling, warmth, erythema.  Assessment: Patient presents with symptomatic onychomycosis; pre-ulcerative callus; gait instability  Plan: -Treatment options including alternatives, risks, complications were discussed -Nails sharply debrided 10 without complication/bleeding. -Pre-ulcerative callus sharply debrided x 1 without any complications or bleeding.  -Prescription for physical therapy for fall assessment was provided. -Discussed daily foot inspection. If there are any changes, to call the office immediately.  -Follow-up in 3 months or sooner if any problems are to arise. In the meantime, encouraged to call the office with any questions, concerns, changes symptoms.  Celesta Gentile, DPM

## 2017-03-23 ENCOUNTER — Other Ambulatory Visit: Payer: Self-pay | Admitting: Internal Medicine

## 2017-03-23 NOTE — Telephone Encounter (Signed)
RB pt 

## 2017-03-24 DIAGNOSIS — R293 Abnormal posture: Secondary | ICD-10-CM | POA: Diagnosis not present

## 2017-03-24 DIAGNOSIS — R269 Unspecified abnormalities of gait and mobility: Secondary | ICD-10-CM | POA: Diagnosis not present

## 2017-03-24 DIAGNOSIS — R296 Repeated falls: Secondary | ICD-10-CM | POA: Diagnosis not present

## 2017-03-24 DIAGNOSIS — M6281 Muscle weakness (generalized): Secondary | ICD-10-CM | POA: Diagnosis not present

## 2017-03-26 ENCOUNTER — Other Ambulatory Visit: Payer: Self-pay | Admitting: Emergency Medicine

## 2017-03-27 ENCOUNTER — Telehealth: Payer: Self-pay | Admitting: Podiatry

## 2017-03-27 NOTE — Telephone Encounter (Signed)
This message is for Lattie Haw, Dr. Leigh Aurora nurse. I'm calling to remind her to mail me a thing for my toe that Dr. Jacqualyn Posey suggested. She said to ask for it on Friday which I did but she was not in the office. My number is [phone]. Thank you and good day.

## 2017-03-28 DIAGNOSIS — M6281 Muscle weakness (generalized): Secondary | ICD-10-CM | POA: Diagnosis not present

## 2017-03-28 DIAGNOSIS — R269 Unspecified abnormalities of gait and mobility: Secondary | ICD-10-CM | POA: Diagnosis not present

## 2017-03-28 DIAGNOSIS — R293 Abnormal posture: Secondary | ICD-10-CM | POA: Diagnosis not present

## 2017-03-28 DIAGNOSIS — R296 Repeated falls: Secondary | ICD-10-CM | POA: Diagnosis not present

## 2017-03-29 NOTE — Telephone Encounter (Signed)
Patient came by yesterday to pick up the pad that Dr Jacqualyn Posey had suggested. Robin Harrell

## 2017-03-30 ENCOUNTER — Telehealth: Payer: Self-pay | Admitting: Cardiology

## 2017-03-30 ENCOUNTER — Telehealth: Payer: Self-pay | Admitting: Emergency Medicine

## 2017-03-30 DIAGNOSIS — R293 Abnormal posture: Secondary | ICD-10-CM | POA: Diagnosis not present

## 2017-03-30 DIAGNOSIS — R269 Unspecified abnormalities of gait and mobility: Secondary | ICD-10-CM | POA: Diagnosis not present

## 2017-03-30 DIAGNOSIS — M6281 Muscle weakness (generalized): Secondary | ICD-10-CM | POA: Diagnosis not present

## 2017-03-30 DIAGNOSIS — R296 Repeated falls: Secondary | ICD-10-CM | POA: Diagnosis not present

## 2017-03-30 MED ORDER — DOXYCYCLINE HYCLATE 100 MG PO TABS: 100.0000 mg | ORAL_TABLET | Freq: Two times a day (BID) | ORAL | 0 refills | Status: DC

## 2017-03-30 NOTE — Telephone Encounter (Signed)
Called and spoke with pt.  Pt reports of nasal drainage yellow in color & prod cough with yellow mucus x2w Pt denies additional resp symptoms. Pt states yesterday she felt that she were going to faint and developed stomach pain. Pt states stomach pain subsided after 2 hours.  Pt is concerned that this may had been caused from the prilosec. Pt states she has been taking prilosec since last OV 02-27-17 with prior side affects.  RB please advise. Thanks.

## 2017-03-30 NOTE — Telephone Encounter (Signed)
Called and spoke with patient, advised her with recommendations. Drug to drug interaction with coumdain. Advised patient to call cardiologist to let them know about the interaction. Will also call pharmacy.

## 2017-03-30 NOTE — Telephone Encounter (Signed)
Okay with me to try stopping the omeprazole.  Have her continue her nasal washes and her other allergy meds  Would not start amoxicillin.  Would recommend doxycycline 100 mg twice daily for 7 days.

## 2017-03-30 NOTE — Telephone Encounter (Signed)
Ms. Duwaine Maxin called the answering service regarding a medication interaction. She was prescribed Doxycycline by her pulmonologist and he wanted her to check with the cardiologist prior to starting it due to a possible interaction with coumadin. Doxycycline can potentiate anticoagulation and increase the risk of bleeding. The patient says that it is not urgent that she start the antibiotic and can wait until tomorrow to discuss with our Pharmacist, Erasmo Downer, who manages her coumadin. I have sent and message to Erasmo Downer and [place] (in case Erasmo Downer is not there) to call pt in the morning regarding this issue. Also routed this message to the coumadin clininc. The patient is fine with that.   Daune Perch, AGNP-C [facility] 03/30/2017  5:38 PM

## 2017-03-31 ENCOUNTER — Telehealth: Payer: Self-pay | Admitting: Emergency Medicine

## 2017-03-31 NOTE — Telephone Encounter (Signed)
Pt states that coumadin clinic gave her the ok to take doxy as prescribed yesterday.  I advised that rx was called in to pharmacy yesterday afternoon.  Pt expressed understanding.  Nothing further needed.

## 2017-04-04 DIAGNOSIS — R296 Repeated falls: Secondary | ICD-10-CM | POA: Diagnosis not present

## 2017-04-04 DIAGNOSIS — M6281 Muscle weakness (generalized): Secondary | ICD-10-CM | POA: Diagnosis not present

## 2017-04-04 DIAGNOSIS — R293 Abnormal posture: Secondary | ICD-10-CM | POA: Diagnosis not present

## 2017-04-04 DIAGNOSIS — R269 Unspecified abnormalities of gait and mobility: Secondary | ICD-10-CM | POA: Diagnosis not present

## 2017-04-05 ENCOUNTER — Ambulatory Visit (INDEPENDENT_AMBULATORY_CARE_PROVIDER_SITE_OTHER): Payer: Medicare Other | Admitting: Pharmacist Clinician (PhC)/ Clinical Pharmacy Specialist

## 2017-04-05 DIAGNOSIS — Z7901 Long term (current) use of anticoagulants: Secondary | ICD-10-CM | POA: Diagnosis not present

## 2017-04-05 DIAGNOSIS — Z5181 Encounter for therapeutic drug level monitoring: Secondary | ICD-10-CM

## 2017-04-05 DIAGNOSIS — D6851 Activated protein C resistance: Secondary | ICD-10-CM | POA: Diagnosis not present

## 2017-04-05 DIAGNOSIS — Z86718 Personal history of other venous thrombosis and embolism: Secondary | ICD-10-CM | POA: Diagnosis not present

## 2017-04-05 LAB — POCT INR: INR: 3.2

## 2017-04-05 NOTE — Patient Instructions (Signed)
Description   Don't take your warfarin today Wednesday Feb 13, then continue taking 1 tablet daily except for 1/2 tablet each Mon,We & Fr. Repeat INR in 6 weeks.

## 2017-04-06 DIAGNOSIS — R296 Repeated falls: Secondary | ICD-10-CM | POA: Diagnosis not present

## 2017-04-06 DIAGNOSIS — M6281 Muscle weakness (generalized): Secondary | ICD-10-CM | POA: Diagnosis not present

## 2017-04-06 DIAGNOSIS — R293 Abnormal posture: Secondary | ICD-10-CM | POA: Diagnosis not present

## 2017-04-06 DIAGNOSIS — R269 Unspecified abnormalities of gait and mobility: Secondary | ICD-10-CM | POA: Diagnosis not present

## 2017-04-11 DIAGNOSIS — R296 Repeated falls: Secondary | ICD-10-CM | POA: Diagnosis not present

## 2017-04-11 DIAGNOSIS — R269 Unspecified abnormalities of gait and mobility: Secondary | ICD-10-CM | POA: Diagnosis not present

## 2017-04-11 DIAGNOSIS — R293 Abnormal posture: Secondary | ICD-10-CM | POA: Diagnosis not present

## 2017-04-11 DIAGNOSIS — M6281 Muscle weakness (generalized): Secondary | ICD-10-CM | POA: Diagnosis not present

## 2017-04-13 DIAGNOSIS — R296 Repeated falls: Secondary | ICD-10-CM | POA: Diagnosis not present

## 2017-04-13 DIAGNOSIS — R269 Unspecified abnormalities of gait and mobility: Secondary | ICD-10-CM | POA: Diagnosis not present

## 2017-04-13 DIAGNOSIS — R293 Abnormal posture: Secondary | ICD-10-CM | POA: Diagnosis not present

## 2017-04-13 DIAGNOSIS — M6281 Muscle weakness (generalized): Secondary | ICD-10-CM | POA: Diagnosis not present

## 2017-04-14 ENCOUNTER — Other Ambulatory Visit: Payer: Self-pay | Admitting: *Deleted

## 2017-04-14 MED ORDER — LOSARTAN POTASSIUM 50 MG PO TABS: 50.0000 mg | ORAL_TABLET | Freq: Every day | ORAL | 0 refills | Status: DC

## 2017-04-18 DIAGNOSIS — R293 Abnormal posture: Secondary | ICD-10-CM | POA: Diagnosis not present

## 2017-04-18 DIAGNOSIS — M6281 Muscle weakness (generalized): Secondary | ICD-10-CM | POA: Diagnosis not present

## 2017-04-18 DIAGNOSIS — R296 Repeated falls: Secondary | ICD-10-CM | POA: Diagnosis not present

## 2017-04-18 DIAGNOSIS — R269 Unspecified abnormalities of gait and mobility: Secondary | ICD-10-CM | POA: Diagnosis not present

## 2017-04-19 ENCOUNTER — Ambulatory Visit (INDEPENDENT_AMBULATORY_CARE_PROVIDER_SITE_OTHER): Payer: Medicare Other | Admitting: Emergency Medicine

## 2017-04-19 ENCOUNTER — Encounter: Payer: Self-pay | Admitting: Emergency Medicine

## 2017-04-19 DIAGNOSIS — R05 Cough: Secondary | ICD-10-CM | POA: Diagnosis not present

## 2017-04-19 DIAGNOSIS — R059 Cough, unspecified: Secondary | ICD-10-CM

## 2017-04-19 MED ORDER — GUAIFENESIN 100 MG/5ML PO SOLN: 30.0000 mL | Freq: Two times a day (BID) | ORAL | 2 refills | Status: DC

## 2017-04-19 NOTE — Progress Notes (Signed)
Subjective:    Patient ID: Robin Harrell, female    DOB: [birth year], 84 y.o.   MRN: [id number]  Cough    84 yo female with COPD, Chronic rhinitis, chronic cough, and GERD, hiatal hernia. 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   ROV 06/16/15 -- patient with a history of allergic rhinitis, GERD, and associated chronic cough. She reports that her allergies have been bothersome, but that her cough has been better. She started atrovent nasal spray last time. Also using chlorpheniramine bid, zyrtec. She has more drainage in the middle of the day.  Has not required pred since last time. She will get choked on saliva or food some times. Overall she feels better than last time and her cough is better controlled.  ROV 02/05/16 -- this is a follow-up visit for chronic cough in the setting of allergic rhinitis and GERD. Also with a history of hiatal hernia, factor V Leiden deficiency with chronic PE. She has been doing fairly well. No GERD sx on omeprazole. She is on ipratropium nasal spray, zyrtec, chlorpheniramine twice a day. Minimal cough right now. Some hoarse voice.   ROV 10/21/16 -- Patient has a history of allergic rhinitis, GERD and associated chronic cough. Also chronic anticoagulation for pulmonary embolism and factor V Leiden deficiency. She returns today reporting that her cough has been much better. She is on zyrtec, chlorpheniramine bid. Has been using atrovent NS bid. She has rhinorrhea. No reflux sx, has remained on PPI  ROV 02/27/17 --84 year old woman with chronic allergic rhinitis, GERD and associated chronic cough.  Normal spirometry in 2010.  She also has a history of hiatal hernia, chronic pulmonary embolism and factor V Leiden deficiency.  She has been managed on zyrtec, chlorpheniramine bid, flonase, ipratropium NS. She had been well controlled lately until she lost her ipratropium NS. She has restarted it. She is off omeprazole.  She uses [place] prn, frequently lately. She is  having persistent cough, thick mucous in the am. She notes some choking on her meds. She has some aspiration sx that are happening frequently - doesn't always do her swallowing precautions.   ROV 04/19/17 --84 year old woman with a history of chronic pulmonary embolism and factor V Leiden deficiency.  This is a follow-up visit for patient with a history of chronic cough.  She has chronic allergic rhinitis, GERD with a hiatal hernia, that all contribute to the cough.  Also some aspiration symptoms.  She was treated for a possible acute sinusitis/bronchitis 2/7 with doxycycline. She didn't take all of the doxy because it got stuck in her throat.  She also reported that she tried stopping omeprazole to see if she would tolerate this. She seems to have tolerated. She is on atrovent NS, was restarted on her flonase by PCP. On chlorpheniramine. She believes that nasal drainage is driving her cough. She had a choking episode today - has led to some cough today.    Review of Systems  Respiratory: Positive for cough.    As per HPI     Objective:   Physical Exam Vitals:   04/19/17 1440  BP: 122/70  Pulse: 91  SpO2: 95%  Weight: 137 lb (62.1 kg)    Gen: Pleasant, well-nourished, in no distress,  normal affect  ENT: No lesions,  mouth clear,  oropharynx clear,   Neck: No JVD,   Lungs: No use of accessory muscles, clear without rales or rhonchi  Cardiovascular: RRR, heart sounds normal, no murmur or  gallops, no peripheral edema  Musculoskeletal: No deformities, no cyanosis or clubbing  Neuro: alert, non focal  Skin: Warm, no lesions or rashes   Current Outpatient Medications:  .  atorvastatin (LIPITOR) 10 MG tablet, Take 10 mg by mouth daily at 6 PM., Disp: , Rfl:  .  brimonidine (ALPHAGAN P) 0.1 % SOLN, Place 1 drop into both eyes every 12 (twelve) hours.  , Disp: , Rfl:  .  calcium carbonate (TUMS - DOSED IN MG ELEMENTAL CALCIUM) 500 MG chewable tablet, Chew by mouth., Disp: , Rfl:  .   cetirizine (ZYRTEC) 10 MG tablet, TAKE 1 TABLET BY MOUTH EVERY DAY, Disp: 30 tablet, Rfl: 5 .  chlorpheniramine (CHLOR-TRIMETON) 4 MG tablet, Take by mouth., Disp: , Rfl:  .  Cholecalciferol (VITAMIN D3) 2000 units TABS, Take 1 tablet by mouth daily. , Disp: , Rfl:  .  fluticasone (FLONASE) 50 MCG/ACT nasal spray, Place 2 sprays into both nostrils daily., Disp: 16 g, Rfl: 6 .  ipratropium (ATROVENT) 0.03 % nasal spray, PLACE 2 SPRAYS INTO BOTH NOSTRILS 3 (THREE) TIMES DAILY AS NEEDED FOR RHINITIS., Disp: 30 mL, Rfl: 5 .  latanoprost (XALATAN) 0.005 % ophthalmic solution, Place 1 drop into both eyes at bedtime. , Disp: , Rfl:  .  levothyroxine (SYNTHROID, LEVOTHROID) 25 MCG tablet, Take 25 mcg by mouth daily before breakfast. , Disp: , Rfl:  .  losartan (COZAAR) 50 MG tablet, Take 1 tablet (50 mg total) by mouth daily. NEED OV., Disp: 30 tablet, Rfl: 0 .  metoprolol tartrate (LOPRESSOR) 25 MG tablet, Take 1 tablet (25 mg total) by mouth 2 (two) times daily. KEEP OV., Disp: 180 tablet, Rfl: 0 .  Multiple Vitamin (MULTIVITAMIN) tablet, Take by mouth., Disp: , Rfl:  .  multivitamin-iron-minerals-folic acid (CENTRUM) chewable tablet, Chew 1 tablet by mouth daily., Disp: , Rfl:  .  warfarin (COUMADIN) 7.5 MG tablet, TAKE 1/2 TO 1 TABLET BY MOUTH DAILY AS DIRECTED BY COUMADIN CLINIC, Disp: 90 tablet, Rfl: 0 .  guaiFENesin (ROBITUSSIN) 100 MG/5ML SOLN, Take 30 mLs (600 mg total) by mouth 2 (two) times daily., Disp: 1800 mL, Rfl: 2      Assessment & Plan:   COUGH, CHRONIC Multifactorial chronic cough.  Her GERD is currently untreated, may need to restart the omeprazole since she has a hiatal hernia.  We will leave it off for now but reconsider if the cough progresses, persists.  Continue allergy regimen, swallowing precautions as below.  She needs a mucolytic and cannot swallow Mucinex pills.  We will try liquid guaifenesin.  Please continue fluticasone nasal spray, Atrovent nasal spray, Zyrtec,  chlorpheniramine Start using liquid formulation of guaifenesin 600 mg twice a day Depending on how your cough does we may have to consider restarting your omeprazole at some point in the future. Continue to practice her swallowing precautions.  Tuck her chin with swallowing.  Only swallow small will chewed bites.  Follow each bite with a small amount of drink. Follow with Dr Lamonte Sakai in 6 months or sooner if you have any problems  Baltazar Apo, MD, PhD 04/19/2017, 3:07 PM [facility] [phone] or if no answer [phone]

## 2017-04-19 NOTE — Patient Instructions (Addendum)
Please continue fluticasone nasal spray, Atrovent nasal spray, Zyrtec, chlorpheniramine Start using liquid formulation of guaifenesin 600 mg twice a day Depending on how your cough does we may have to consider restarting your omeprazole at some point in the future. Continue to practice her swallowing precautions.  Tuck her chin with swallowing.  Only swallow small will chewed bites.  Follow each bite with a small amount of drink. Follow with Dr Lamonte Sakai in 6 months or sooner if you have any problems

## 2017-04-19 NOTE — Assessment & Plan Note (Signed)
Multifactorial chronic cough.  Her GERD is currently untreated, may need to restart the omeprazole since she has a hiatal hernia.  We will leave it off for now but reconsider if the cough progresses, persists.  Continue allergy regimen, swallowing precautions as below.  She needs a mucolytic and cannot swallow Mucinex pills.  We will try liquid guaifenesin.  Please continue fluticasone nasal spray, Atrovent nasal spray, Zyrtec, chlorpheniramine Start using liquid formulation of guaifenesin 600 mg twice a day Depending on how your cough does we may have to consider restarting your omeprazole at some point in the future. Continue to practice her swallowing precautions.  Tuck her chin with swallowing.  Only swallow small will chewed bites.  Follow each bite with a small amount of drink. Follow with Dr Lamonte Sakai in 6 months or sooner if you have any problems

## 2017-04-20 DIAGNOSIS — R293 Abnormal posture: Secondary | ICD-10-CM | POA: Diagnosis not present

## 2017-04-20 DIAGNOSIS — R296 Repeated falls: Secondary | ICD-10-CM | POA: Diagnosis not present

## 2017-04-20 DIAGNOSIS — R269 Unspecified abnormalities of gait and mobility: Secondary | ICD-10-CM | POA: Diagnosis not present

## 2017-04-20 DIAGNOSIS — M6281 Muscle weakness (generalized): Secondary | ICD-10-CM | POA: Diagnosis not present

## 2017-04-24 ENCOUNTER — Other Ambulatory Visit: Payer: Self-pay | Admitting: Emergency Medicine

## 2017-04-25 DIAGNOSIS — R293 Abnormal posture: Secondary | ICD-10-CM | POA: Diagnosis not present

## 2017-04-25 DIAGNOSIS — M6281 Muscle weakness (generalized): Secondary | ICD-10-CM | POA: Diagnosis not present

## 2017-04-25 DIAGNOSIS — R269 Unspecified abnormalities of gait and mobility: Secondary | ICD-10-CM | POA: Diagnosis not present

## 2017-04-25 DIAGNOSIS — R296 Repeated falls: Secondary | ICD-10-CM | POA: Diagnosis not present

## 2017-04-28 DIAGNOSIS — R296 Repeated falls: Secondary | ICD-10-CM | POA: Diagnosis not present

## 2017-04-28 DIAGNOSIS — R269 Unspecified abnormalities of gait and mobility: Secondary | ICD-10-CM | POA: Diagnosis not present

## 2017-04-28 DIAGNOSIS — R293 Abnormal posture: Secondary | ICD-10-CM | POA: Diagnosis not present

## 2017-04-28 DIAGNOSIS — M6281 Muscle weakness (generalized): Secondary | ICD-10-CM | POA: Diagnosis not present

## 2017-05-01 ENCOUNTER — Encounter: Payer: Self-pay | Admitting: Podiatry

## 2017-05-01 ENCOUNTER — Ambulatory Visit (INDEPENDENT_AMBULATORY_CARE_PROVIDER_SITE_OTHER): Payer: Medicare Other | Admitting: Podiatry

## 2017-05-01 DIAGNOSIS — D689 Coagulation defect, unspecified: Secondary | ICD-10-CM | POA: Diagnosis not present

## 2017-05-01 DIAGNOSIS — B351 Tinea unguium: Secondary | ICD-10-CM

## 2017-05-01 DIAGNOSIS — L84 Corns and callosities: Secondary | ICD-10-CM

## 2017-05-01 DIAGNOSIS — M79676 Pain in unspecified toe(s): Secondary | ICD-10-CM | POA: Diagnosis not present

## 2017-05-01 NOTE — Progress Notes (Signed)
Subjective: 84 y.o. returns the office today for painful, elongated, thickened toenails which she cannot trim herself and for a painful callus to the right 4th toe.  She states the callus in the right fourth toe is very painful.  Offloading pad that I dispensed last appointment has been helping quite a bit.  She denies any bleeding or any swelling or redness.  She did go to physical therapy and she is to continue with therapy to work on her balance and gait training.  She has no recent falls that she reports as I last saw her she has no other concerns today. Denies any acute changes since last appointment and no new complaints today. Denies any systemic complaints such as fevers, chills, nausea, vomiting.   She is on Coumadin  Objective: AAO 3, NAD DP/PT pulses palpable, CRT less than 3 seconds Nails hypertrophic, dystrophic, elongated, brittle, discolored 10. There is tenderness overlying the nails 1-5 bilaterally. There is no surrounding erythema or drainage along the nail sites. Hyperkeratotic lesion distal right 4th toe which is pre-ulcerative.  No underlying ulceration, drainage or any signs of infection noted today.  Hammertoe contractures present. No open lesions or pre-ulcerative lesions are identified. No other areas of tenderness bilateral lower extremities. No overlying edema, erythema, increased warmth. No pain with calf compression, swelling, warmth, erythema.  Assessment: Patient presents with symptomatic onychomycosis; pre-ulcerative callus; gait instability  Plan: -Treatment options including alternatives, risks, complications were discussed -Nails sharply debrided 10 without complication/bleeding. -Pre-ulcerative callus sharply debrided x 1 without any complications or bleeding. Continue offloading. Discussed surgery but she declines this.  -Continue with physical therapy.  -Discussed daily foot inspection. If there are any changes, to call the office immediately.   -Follow-up in 9 weeks or sooner if any problems are to arise. In the meantime, encouraged to call the office with any questions, concerns, changes symptoms.  Celesta Gentile, DPM

## 2017-05-02 DIAGNOSIS — R269 Unspecified abnormalities of gait and mobility: Secondary | ICD-10-CM | POA: Diagnosis not present

## 2017-05-02 DIAGNOSIS — M6281 Muscle weakness (generalized): Secondary | ICD-10-CM | POA: Diagnosis not present

## 2017-05-02 DIAGNOSIS — R296 Repeated falls: Secondary | ICD-10-CM | POA: Diagnosis not present

## 2017-05-02 DIAGNOSIS — R293 Abnormal posture: Secondary | ICD-10-CM | POA: Diagnosis not present

## 2017-05-04 DIAGNOSIS — R293 Abnormal posture: Secondary | ICD-10-CM | POA: Diagnosis not present

## 2017-05-04 DIAGNOSIS — R269 Unspecified abnormalities of gait and mobility: Secondary | ICD-10-CM | POA: Diagnosis not present

## 2017-05-04 DIAGNOSIS — R296 Repeated falls: Secondary | ICD-10-CM | POA: Diagnosis not present

## 2017-05-04 DIAGNOSIS — M6281 Muscle weakness (generalized): Secondary | ICD-10-CM | POA: Diagnosis not present

## 2017-05-05 ENCOUNTER — Other Ambulatory Visit: Payer: Self-pay | Admitting: Cardiovascular Disease

## 2017-05-05 NOTE — Telephone Encounter (Signed)
REFILL 

## 2017-05-08 ENCOUNTER — Other Ambulatory Visit: Payer: Self-pay | Admitting: *Deleted

## 2017-05-08 MED ORDER — LOSARTAN POTASSIUM 50 MG PO TABS: 50.0000 mg | ORAL_TABLET | Freq: Every day | ORAL | 0 refills | Status: DC

## 2017-05-08 NOTE — Telephone Encounter (Signed)
Rx has been sent to the pharmacy electronically. ° °

## 2017-05-09 DIAGNOSIS — R293 Abnormal posture: Secondary | ICD-10-CM | POA: Diagnosis not present

## 2017-05-09 DIAGNOSIS — R296 Repeated falls: Secondary | ICD-10-CM | POA: Diagnosis not present

## 2017-05-09 DIAGNOSIS — R269 Unspecified abnormalities of gait and mobility: Secondary | ICD-10-CM | POA: Diagnosis not present

## 2017-05-09 DIAGNOSIS — M6281 Muscle weakness (generalized): Secondary | ICD-10-CM | POA: Diagnosis not present

## 2017-05-11 ENCOUNTER — Other Ambulatory Visit: Payer: Self-pay | Admitting: Family Medicine

## 2017-05-11 DIAGNOSIS — R269 Unspecified abnormalities of gait and mobility: Secondary | ICD-10-CM | POA: Diagnosis not present

## 2017-05-11 DIAGNOSIS — R296 Repeated falls: Secondary | ICD-10-CM | POA: Diagnosis not present

## 2017-05-11 DIAGNOSIS — R293 Abnormal posture: Secondary | ICD-10-CM | POA: Diagnosis not present

## 2017-05-11 DIAGNOSIS — M6281 Muscle weakness (generalized): Secondary | ICD-10-CM | POA: Diagnosis not present

## 2017-05-18 ENCOUNTER — Other Ambulatory Visit: Payer: Self-pay | Admitting: Cardiovascular Disease

## 2017-05-18 DIAGNOSIS — R269 Unspecified abnormalities of gait and mobility: Secondary | ICD-10-CM | POA: Diagnosis not present

## 2017-05-18 DIAGNOSIS — R293 Abnormal posture: Secondary | ICD-10-CM | POA: Diagnosis not present

## 2017-05-18 DIAGNOSIS — R296 Repeated falls: Secondary | ICD-10-CM | POA: Diagnosis not present

## 2017-05-18 DIAGNOSIS — M6281 Muscle weakness (generalized): Secondary | ICD-10-CM | POA: Diagnosis not present

## 2017-05-19 ENCOUNTER — Ambulatory Visit (INDEPENDENT_AMBULATORY_CARE_PROVIDER_SITE_OTHER): Payer: Medicare Other | Admitting: Pharmacist

## 2017-05-19 DIAGNOSIS — D6851 Activated protein C resistance: Secondary | ICD-10-CM

## 2017-05-19 DIAGNOSIS — Z5181 Encounter for therapeutic drug level monitoring: Secondary | ICD-10-CM

## 2017-05-19 DIAGNOSIS — Z7901 Long term (current) use of anticoagulants: Secondary | ICD-10-CM

## 2017-05-19 LAB — POCT INR: INR: 2.9

## 2017-05-19 NOTE — Telephone Encounter (Signed)
REFILL 

## 2017-05-19 NOTE — Patient Instructions (Signed)
Continue taking 1 tablet daily except for 1/2 tablet each Mon,We & Fr. Repeat INR in 6 weeks.

## 2017-05-23 DIAGNOSIS — M6281 Muscle weakness (generalized): Secondary | ICD-10-CM | POA: Diagnosis not present

## 2017-05-23 DIAGNOSIS — R269 Unspecified abnormalities of gait and mobility: Secondary | ICD-10-CM | POA: Diagnosis not present

## 2017-05-23 DIAGNOSIS — R296 Repeated falls: Secondary | ICD-10-CM | POA: Diagnosis not present

## 2017-05-23 DIAGNOSIS — R293 Abnormal posture: Secondary | ICD-10-CM | POA: Diagnosis not present

## 2017-05-25 DIAGNOSIS — R269 Unspecified abnormalities of gait and mobility: Secondary | ICD-10-CM | POA: Diagnosis not present

## 2017-05-25 DIAGNOSIS — R293 Abnormal posture: Secondary | ICD-10-CM | POA: Diagnosis not present

## 2017-05-25 DIAGNOSIS — R296 Repeated falls: Secondary | ICD-10-CM | POA: Diagnosis not present

## 2017-05-25 DIAGNOSIS — M6281 Muscle weakness (generalized): Secondary | ICD-10-CM | POA: Diagnosis not present

## 2017-06-07 ENCOUNTER — Telehealth: Payer: Self-pay

## 2017-06-07 ENCOUNTER — Other Ambulatory Visit: Payer: Self-pay | Admitting: Cardiovascular Disease

## 2017-06-07 NOTE — Telephone Encounter (Signed)
Called patient to schedule a yearly visit with Dr Gwenlyn Found; patient declined and stated she was told by Dr Gwenlyn Found that she did not need to come back. I told her that she has to be seen once a year to receive medication refills. She insisted that I ask Dr Gwenlyn Found if she should come back before she makes an appointment.

## 2017-06-12 ENCOUNTER — Ambulatory Visit (INDEPENDENT_AMBULATORY_CARE_PROVIDER_SITE_OTHER): Payer: Medicare Other | Admitting: Family Medicine

## 2017-06-12 ENCOUNTER — Encounter: Payer: Self-pay | Admitting: Family Medicine

## 2017-06-12 VITALS — BP 118/70 | HR 71 | Temp 97.5°F | Ht 60.0 in | Wt 131.4 lb

## 2017-06-12 DIAGNOSIS — B9689 Other specified bacterial agents as the cause of diseases classified elsewhere: Secondary | ICD-10-CM | POA: Diagnosis not present

## 2017-06-12 DIAGNOSIS — Z7901 Long term (current) use of anticoagulants: Secondary | ICD-10-CM | POA: Diagnosis not present

## 2017-06-12 DIAGNOSIS — J208 Acute bronchitis due to other specified organisms: Secondary | ICD-10-CM

## 2017-06-12 MED ORDER — BENZONATATE 100 MG PO CAPS: 100.0000 mg | ORAL_CAPSULE | Freq: Two times a day (BID) | ORAL | 0 refills | Status: DC | PRN

## 2017-06-12 MED ORDER — HYDROCODONE-HOMATROPINE 5-1.5 MG/5ML PO SYRP: 5.0000 mL | ORAL_SOLUTION | Freq: Three times a day (TID) | ORAL | 0 refills | Status: DC | PRN

## 2017-06-12 MED ORDER — AZITHROMYCIN 250 MG PO TABS: ORAL_TABLET | ORAL | 0 refills | Status: DC

## 2017-06-12 NOTE — Patient Instructions (Addendum)
Please follow up if symptoms do not improve or as needed.   Please monitor yourself for signs of bruising or bleeding because the Zpak can affect your coumadin levels.   The cough syrup can be used at night.   Drink plenty of fluids and get some rest.

## 2017-06-12 NOTE — Progress Notes (Signed)
Subjective  CC:  Chief Complaint  Patient presents with  . Cough    productive cough x 7 days with white discharge.     HPI: SUBJECTIVE:  Robin Harrell is a 84 y.o. female who complains of congestion, nasal blockage, post nasal drip, cough described as productive and denies sinus, high fevers, SOB, chest pain or significant GI symptoms. Symptoms have been present for over a week. Husband has similar illness and gave it to her. He is now abx for bronchitis. Cough interferes with sleep. . She denies a history of anorexia, dizziness, vomiting and wheezing. She denies a history of asthma or COPD. Patient does not smoke cigarettes.  I reviewed the patients updated PMH, FH, and SocHx.  Social History: Patient  reports that she quit smoking about 59 years ago. Her smoking use included cigarettes. She quit after 1.00 year of use. She has never used smokeless tobacco. She reports that she drinks alcohol. She reports that she does not use drugs.  Patient Active Problem List   Diagnosis Date Noted  . Hypothyroidism (acquired) 09/02/2014    Priority: High  . Hyperlipidemia 11/19/2012    Priority: High  . Long term (current) use of anticoagulants 05/10/2012    Priority: High  . Diastolic dysfunction 77/41/2878    Priority: High  . Factor V deficiency ([place]) 02/09/2010    Priority: High  . Osteoporosis 02/09/2010    Priority: High  . Essential hypertension 11/15/2007    Priority: High  . Chronic idiopathic thrombocytopenia ([facility]) 04/14/2016    Priority: Medium  . Essential tremor 09/03/2015    Priority: Medium  . Edema of right lower extremity 02/01/2013    Priority: Medium  . Colon polyp 10/15/2012    Priority: Medium  . Insomnia 10/11/2011    Priority: Medium  . Osteoarthritis, multiple sites 06/30/2011    Priority: Medium  . DJD (degenerative joint disease), lumbar 10/14/2009    Priority: Medium  . Glaucoma 01/29/2008    Priority: Medium  . History of DVT (deep vein  thrombosis) 11/15/2007    Priority: Medium  . GERD 11/15/2007    Priority: Medium  . COUGH, CHRONIC 11/15/2007    Priority: Medium  . History of recurrent UTIs 01/26/2017    Priority: Low  . History of respiratory failure 11/11/2014    Priority: Low  . Seasonal and perennial allergic rhinitis 11/15/2007    Priority: Low  . Aortic stenosis, mild 02/07/2017    Review of Systems: + malaise Cardiovascular: negative for chest pain Respiratory: negative for SOB or hemoptysis Gastrointestinal: negative for abdominal pain Genitourinary: negative for dysuria or gross hematuria Current Meds  Medication Sig  . atorvastatin (LIPITOR) 10 MG tablet Take 10 mg by mouth daily at 6 PM.  . brimonidine (ALPHAGAN P) 0.1 % SOLN Place 1 drop into both eyes every 12 (twelve) hours.    . calcium carbonate (TUMS - DOSED IN MG ELEMENTAL CALCIUM) 500 MG chewable tablet Chew by mouth.  . cetirizine (ZYRTEC) 10 MG tablet TAKE 1 TABLET BY MOUTH EVERY DAY  . chlorpheniramine (CHLOR-TRIMETON) 4 MG tablet Take by mouth.  . fluticasone (FLONASE) 50 MCG/ACT nasal spray Place 2 sprays into both nostrils daily.  Marland Kitchen ipratropium (ATROVENT) 0.03 % nasal spray PLACE 2 SPRAYS INTO BOTH NOSTRILS 3 (THREE) TIMES DAILY AS NEEDED FOR RHINITIS.  Marland Kitchen latanoprost (XALATAN) 0.005 % ophthalmic solution Place 1 drop into both eyes at bedtime.   Marland Kitchen levothyroxine (SYNTHROID, LEVOTHROID) 25 MCG tablet TAKE 1 TABLET BY  MOUTH EVERY DAY  . losartan (COZAAR) 50 MG tablet Take 1 tablet (50 mg total) by mouth daily. NEED OV.  . metoprolol tartrate (LOPRESSOR) 25 MG tablet TAKE 1 TABLET (25 MG TOTAL) BY MOUTH 2 (TWO) TIMES DAILY. KEEP OV.  . Multiple Vitamin (MULTIVITAMIN) tablet Take by mouth.  . multivitamin-iron-minerals-folic acid (CENTRUM) chewable tablet Chew 1 tablet by mouth daily.  Marland Kitchen warfarin (COUMADIN) 7.5 MG tablet TAKE 1/2 TO 1 TABLET BY MOUTH DAILY AS DIRECTED BY COUMADIN CLINIC    Objective  Vitals: BP 118/70   Pulse 71   Temp  (!) 97.5 F (36.4 C)   Ht 5' (1.524 m)   Wt 131 lb 6.4 oz (59.6 kg)   LMP  (LMP Unknown)   BMI 25.66 kg/m  General: no acute distress  Psych:  Alert and oriented, normal mood and affect HEENT:  Normocephalic, atraumatic, supple neck, moist mucous membranes, mildly erythematous pharynx without exudate, mild lymphadenopathy, supple neck Cardiovascular:  RRR without murmur. no edema Respiratory:  Good breath sounds bilaterally, CTAB with normal respiratory effort without rales or rhonchi Skin:  Warm, no rashes   Assessment  1. Acute bacterial bronchitis   2. Long term (current) use of anticoagulants      Plan  Discussion:  Treat for bacterial bronchitis due to prolonged course and worsening symptoms. Education regarding differences between viral and bacterial infections and treatment options are discussed.  Supportive care measures are recommended.  We discussed the use of mucolytic's, decongestants, antihistamines and antitussives as needed.  Tylenol or Advil are recommended if needed. Caution advised regarding zpak and coumadin. Pt has used in past; can't swallow large pills which limits abx choices. Cough meds for nighttime and daytime ordered.   Follow up: Return if symptoms worsen or fail to improve.   Commons side effects, risks, benefits, and alternatives for medications and treatment plan prescribed today were discussed, and the patient expressed understanding of the given instructions. Patient is instructed to call or message via MyChart if he/she has any questions or concerns regarding our treatment plan. No barriers to understanding were identified. We discussed Red Flag symptoms and signs in detail. Patient expressed understanding regarding what to do in case of urgent or emergency type symptoms.  Medication list was reconciled, printed and provided to the patient in AVS. Patient instructions and summary information was reviewed with the patient as documented in the AVS. This  note was prepared with assistance of Dragon voice recognition software. Occasional wrong-word or sound-a-like substitutions may have occurred due to the inherent limitations of voice recognition software  No orders of the defined types were placed in this encounter.  Meds ordered this encounter  Medications  . benzonatate (TESSALON) 100 MG capsule    Sig: Take 1 capsule (100 mg total) by mouth 2 (two) times daily as needed for cough.    Dispense:  20 capsule    Refill:  0  . HYDROcodone-homatropine (HYCODAN) 5-1.5 MG/5ML syrup    Sig: Take 5 mLs by mouth every 8 (eight) hours as needed for cough.    Dispense:  120 mL    Refill:  0  . azithromycin (ZITHROMAX) 250 MG tablet    Sig: Take 2 tabs today, then 1 tab daily for 4 days    Dispense:  1 each    Refill:  0

## 2017-06-13 ENCOUNTER — Other Ambulatory Visit: Payer: Self-pay | Admitting: Cardiovascular Disease

## 2017-06-14 ENCOUNTER — Other Ambulatory Visit: Payer: Self-pay | Admitting: *Deleted

## 2017-06-14 ENCOUNTER — Other Ambulatory Visit: Payer: Self-pay | Admitting: Cardiovascular Disease

## 2017-06-14 MED ORDER — LOSARTAN POTASSIUM 50 MG PO TABS: 50.0000 mg | ORAL_TABLET | Freq: Every day | ORAL | 0 refills | Status: DC

## 2017-06-14 NOTE — Telephone Encounter (Signed)
°*  STAT* If patient is at the pharmacy, call can be transferred to refill team.   1. Which medications need to be refilled? (please list name of each medication and dose if known) Losartan  2. Which pharmacy/location (including street and city if local pharmacy) is medication to be sent to?[phone]  3. Do they need a 30 day or 90 day supply?[place]

## 2017-06-15 NOTE — Telephone Encounter (Signed)
Robin Harrell will you ask Dr Gwenlyn Found if she needs to follow up for med refills I tols her she does but she wants Dr Gwenlyn Found to say if for her to schedule an appointment.

## 2017-06-23 DIAGNOSIS — H401131 Primary open-angle glaucoma, bilateral, mild stage: Secondary | ICD-10-CM | POA: Diagnosis not present

## 2017-06-25 ENCOUNTER — Other Ambulatory Visit: Payer: Self-pay | Admitting: Cardiovascular Disease

## 2017-06-26 NOTE — Telephone Encounter (Signed)
REFILL 

## 2017-06-30 ENCOUNTER — Encounter: Payer: Self-pay | Admitting: Cardiovascular Disease

## 2017-06-30 ENCOUNTER — Ambulatory Visit (INDEPENDENT_AMBULATORY_CARE_PROVIDER_SITE_OTHER): Payer: Medicare Other | Admitting: Pharmacist

## 2017-06-30 ENCOUNTER — Ambulatory Visit (INDEPENDENT_AMBULATORY_CARE_PROVIDER_SITE_OTHER): Payer: Medicare Other | Admitting: Cardiovascular Disease

## 2017-06-30 DIAGNOSIS — I350 Nonrheumatic aortic (valve) stenosis: Secondary | ICD-10-CM | POA: Diagnosis not present

## 2017-06-30 DIAGNOSIS — D6851 Activated protein C resistance: Secondary | ICD-10-CM | POA: Diagnosis not present

## 2017-06-30 DIAGNOSIS — I10 Essential (primary) hypertension: Secondary | ICD-10-CM

## 2017-06-30 DIAGNOSIS — I5189 Other ill-defined heart diseases: Secondary | ICD-10-CM

## 2017-06-30 DIAGNOSIS — E7800 Pure hypercholesterolemia, unspecified: Secondary | ICD-10-CM

## 2017-06-30 DIAGNOSIS — Z5181 Encounter for therapeutic drug level monitoring: Secondary | ICD-10-CM

## 2017-06-30 DIAGNOSIS — Z7901 Long term (current) use of anticoagulants: Secondary | ICD-10-CM

## 2017-06-30 LAB — POCT INR: INR: 2.5

## 2017-06-30 NOTE — Progress Notes (Signed)
06/30/2017 Robin Harrell [facility]   [birth year]  [id number]  Primary Physician [name], MD Primary Cardiologist: Lorretta Harp MD FACP, [place], [place], [place]  HPI:  Robin Harrell is a 84 y.o.  thin-appearing, married Caucasian female, mother of 63, grandmother to 4 grandchildren who I last saw in the office on  01/27/2016.Marland Kitchen She has a history of treated hypertension, hyperlipidemia, and GERD. She does have a history of pulmonary embolism in the past with factor V Leiden deficiency on life-long Coumadin anticoagulation. She has had a normal 2D echo and Myoview back in 2009. She was recently admitted to [facility] for 2 weeks with acute respiratory insufficiency requiring intubation related to pneumonia and RSV. There was a thought that she may have had congestive heart failure as well, though a 2D echo was normal. She did have a Holter monitor that showed a large number of bigeminal PVCs which she is asymptomatic from and currently is on low-dose beta blocker. She currently denies chest pain or shortness of breath.     Current Meds  Medication Sig  . atorvastatin (LIPITOR) 10 MG tablet Take 1 tablet (10 mg total) by mouth daily at 6 PM. KEEP OV.  Marland Kitchen azithromycin (ZITHROMAX) 250 MG tablet Take 2 tabs today, then 1 tab daily for 4 days  . benzonatate (TESSALON) 100 MG capsule Take 1 capsule (100 mg total) by mouth 2 (two) times daily as needed for cough.  . brimonidine (ALPHAGAN) 0.15 % ophthalmic solution Place 1 drop into both eyes 2 (two) times daily.  . calcium carbonate (TUMS - DOSED IN MG ELEMENTAL CALCIUM) 500 MG chewable tablet Chew by mouth.  . cetirizine (ZYRTEC) 10 MG tablet TAKE 1 TABLET BY MOUTH EVERY DAY  . chlorpheniramine (CHLOR-TRIMETON) 4 MG tablet Take by mouth.  . [facility] TUSSIN ADULT CHEST CONGEST 100 MG/5ML liquid TAKE 30 MLS (600 MG TOTAL) BY MOUTH 2 (TWO) TIMES DAILY.  . fluticasone (FLONASE) 50 MCG/ACT nasal spray Place 2 sprays into both nostrils daily.  Marland Kitchen  HYDROcodone-homatropine (HYCODAN) 5-1.5 MG/5ML syrup Take 5 mLs by mouth every 8 (eight) hours as needed for cough.  Marland Kitchen ipratropium (ATROVENT) 0.03 % nasal spray PLACE 2 SPRAYS INTO BOTH NOSTRILS 3 (THREE) TIMES DAILY AS NEEDED FOR RHINITIS.  Marland Kitchen latanoprost (XALATAN) 0.005 % ophthalmic solution Place 1 drop into both eyes at bedtime.   Marland Kitchen levothyroxine (SYNTHROID, LEVOTHROID) 25 MCG tablet TAKE 1 TABLET BY MOUTH EVERY DAY  . losartan (COZAAR) 50 MG tablet Take 1 tablet (50 mg total) by mouth daily. NEED OV.  . metoprolol tartrate (LOPRESSOR) 25 MG tablet TAKE 1 TABLET (25 MG TOTAL) BY MOUTH 2 (TWO) TIMES DAILY. KEEP OV.  . Multiple Vitamin (MULTIVITAMIN) tablet Take by mouth.  . multivitamin-iron-minerals-folic acid (CENTRUM) chewable tablet Chew 1 tablet by mouth daily.  Marland Kitchen warfarin (COUMADIN) 7.5 MG tablet TAKE 1/2 TO 1 TABLET BY MOUTH DAILY AS DIRECTED BY COUMADIN CLINIC     Allergies  Allergen Reactions  . Codeine Nausea Only  . Erythromycin Nausea And Vomiting    Social History   Socioeconomic History  . Marital status: Married    Spouse name: Not on file  . Number of children: 4  . Years of education: 13.5  . Highest education level: Not on file  Occupational History  . 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 Financial resource strain: Not on file  . Food insecurity:    Worry: Not on file  Inability: Not on file  . Transportation needs:    Medical: Not on file    Non-medical: Not on file  Tobacco Use  . Smoking status: Former Smoker    Years: 1.00    Types: Cigarettes    Last attempt to quit: 02/21/1958    Years since quitting: 59.3  . Smokeless tobacco: Never Used  Substance and Sexual Activity  . Alcohol use: Yes    Comment: rareley one glass of wine  . Drug use: No  . Sexual activity: Never  Lifestyle  . Physical activity:    Days per week: Not on file    Minutes per session: Not on file  . Stress: Not on file  Relationships  . Social connections:     Talks on phone: Not on file    Gets together: Not on file    Attends religious service: Not on file    Active member of club or organization: Not on file    Attends meetings of clubs or organizations: Not on file    Relationship status: Not on file  . Intimate partner violence:    Fear of current or ex partner: Not on file    Emotionally abused: Not on file    Physically abused: Not on file    Forced sexual activity: Not on file  Other Topics Concern  . Not on file  Social History Narrative  . Not on file     Review of Systems: General: negative for chills, fever, night sweats or weight changes.  Cardiovascular: negative for chest pain, dyspnea on exertion, edema, orthopnea, palpitations, paroxysmal nocturnal dyspnea or shortness of breath Dermatological: negative for rash Respiratory: negative for cough or wheezing Urologic: negative for hematuria Abdominal: negative for nausea, vomiting, diarrhea, bright red blood per rectum, melena, or hematemesis Neurologic: negative for visual changes, syncope, or dizziness All other systems reviewed and are otherwise negative except as noted above.    Blood pressure 130/86, pulse 65, height 5\' 2"  (1.575 m), weight 133 lb (60.3 kg).  General appearance: alert and no distress Neck: no adenopathy, no carotid bruit, no JVD, supple, symmetrical, trachea midline and thyroid not enlarged, symmetric, no tenderness/mass/nodules Lungs: clear to auscultation bilaterally Heart: regular rate and rhythm, S1, S2 normal, no murmur, click, rub or gallop Extremities: extremities normal, atraumatic, no cyanosis or edema Pulses: 2+ and symmetric Skin: Skin color, texture, turgor normal. No rashes or lesions Neurologic: Alert and oriented X 3, normal strength and tone. Normal symmetric reflexes. Normal coordination and gait  EKG not performed today  ASSESSMENT AND PLAN:   Essential hypertension History of essential hypertension with blood pressure  measured at 130/86.  Is metoprolol and losartan.  Continue current meds at current dosing  Hyperlipidemia History of hyperlipidemia on statin therapy  Diastolic dysfunction History of diastolic dysfunction with echo performed 02/07/2017 revealing normal LV systolic function with grade 3 diastolic dysfunction.  Aortic stenosis, mild History of mild aortic stenosis with valve area of 1.15 cm with a peak gradient of 25 mmHg.        Lorretta Harp MD FACP,FACC,FAHA, [facility] 06/30/2017 11:42 AM

## 2017-06-30 NOTE — Assessment & Plan Note (Signed)
History of essential hypertension with blood pressure measured at 130/86.  Is metoprolol and losartan.  Continue current meds at current dosing

## 2017-06-30 NOTE — Patient Instructions (Signed)

## 2017-06-30 NOTE — Assessment & Plan Note (Signed)
History of hyperlipidemia on statin therapy. 

## 2017-06-30 NOTE — Assessment & Plan Note (Signed)
History of diastolic dysfunction with echo performed 02/07/2017 revealing normal LV systolic function with grade 3 diastolic dysfunction.

## 2017-06-30 NOTE — Assessment & Plan Note (Signed)
History of mild aortic stenosis with valve area of 1.15 cm with a peak gradient of 25 mmHg.

## 2017-07-04 ENCOUNTER — Ambulatory Visit (INDEPENDENT_AMBULATORY_CARE_PROVIDER_SITE_OTHER): Payer: Medicare Other | Admitting: Podiatry

## 2017-07-04 DIAGNOSIS — B351 Tinea unguium: Secondary | ICD-10-CM | POA: Diagnosis not present

## 2017-07-04 DIAGNOSIS — M79676 Pain in unspecified toe(s): Secondary | ICD-10-CM | POA: Diagnosis not present

## 2017-07-04 DIAGNOSIS — Q828 Other specified congenital malformations of skin: Secondary | ICD-10-CM

## 2017-07-04 DIAGNOSIS — D689 Coagulation defect, unspecified: Secondary | ICD-10-CM

## 2017-07-04 NOTE — Telephone Encounter (Signed)
Patient has been seen by dr berry.

## 2017-07-05 NOTE — Progress Notes (Signed)
Subjective: 84 y.o. returns the office today for painful, elongated, thickened toenails which she cannot trim herself and for a painful callus to the right 4th toe.  She states that she has not noticed any redness or drainage from the toenail callus sites.  The right fourth toe has started to hurt again from the callus.  No recent injury that she recalls.  She did use offloading pads which helps.   She is on Coumadin  Objective: AAO 3, NAD DP/PT pulses palpable, CRT less than 3 seconds Nails hypertrophic, dystrophic, elongated, brittle, discolored 10. There is tenderness overlying the nails 1-5 bilaterally. There is no surrounding erythema or drainage along the nail sites. Hyperkeratotic lesion distal right 4th toe which is pre-ulcerative and there is also started a pre-ulcerative callus to the left fourth toe.  No underlying ulceration, drainage or any signs of infection noted today.  Hammertoe contractures present to either digit. No open lesions or pre-ulcerative lesions are identified. No other areas of tenderness bilateral lower extremities. No overlying edema, erythema, increased warmth. No pain with calf compression, swelling, warmth, erythema.  Assessment: Patient presents with symptomatic onychomycosis; pre-ulcerative callus; gait instability  Plan: -Treatment options including alternatives, risks, complications were discussed -Nails sharply debrided 10 without complication/bleeding. -Pre-ulcerative callus sharply debrided x 1 without any complications or bleeding. Continue offloading.  -Discussed daily foot inspection. If there are any changes, to call the office immediately.  -Follow-up in 9 weeks or sooner if any problems are to arise. In the meantime, encouraged to call the office with any questions, concerns, changes symptoms.  Celesta Gentile, DPM

## 2017-07-09 ENCOUNTER — Other Ambulatory Visit: Payer: Self-pay | Admitting: Family Medicine

## 2017-07-10 NOTE — Telephone Encounter (Signed)
According to chart, Hypertension is managed by Dr. Gwenlyn Found. Ok to Refill?   Please advise.   Doloris Hall,  LPN

## 2017-07-13 ENCOUNTER — Other Ambulatory Visit: Payer: Self-pay | Admitting: Family Medicine

## 2017-07-13 NOTE — Telephone Encounter (Signed)
Patient sees Dr. Gwenlyn Found and he manages her Hypertension.

## 2017-07-19 DIAGNOSIS — D229 Melanocytic nevi, unspecified: Secondary | ICD-10-CM | POA: Diagnosis not present

## 2017-07-19 DIAGNOSIS — L570 Actinic keratosis: Secondary | ICD-10-CM | POA: Diagnosis not present

## 2017-07-19 DIAGNOSIS — D1801 Hemangioma of skin and subcutaneous tissue: Secondary | ICD-10-CM | POA: Diagnosis not present

## 2017-07-19 DIAGNOSIS — I8311 Varicose veins of right lower extremity with inflammation: Secondary | ICD-10-CM | POA: Diagnosis not present

## 2017-07-19 DIAGNOSIS — I8312 Varicose veins of left lower extremity with inflammation: Secondary | ICD-10-CM | POA: Diagnosis not present

## 2017-07-19 DIAGNOSIS — Z85828 Personal history of other malignant neoplasm of skin: Secondary | ICD-10-CM | POA: Diagnosis not present

## 2017-07-19 DIAGNOSIS — L821 Other seborrheic keratosis: Secondary | ICD-10-CM | POA: Diagnosis not present

## 2017-08-07 ENCOUNTER — Telehealth: Payer: Self-pay | Admitting: Emergency Medicine

## 2017-08-07 NOTE — Telephone Encounter (Signed)
Left Message to Return Call to go over Prolia Benefits.

## 2017-08-07 NOTE — Telephone Encounter (Signed)
Copied from [place] [id number]. Topic: General - Other >> Aug 07, 2017 10:56 AM Keene Breath wrote: Reason for CRM: Patient is returning call from nurse who left a message stating that she wanted to go over her Prolia benefits.  CB# [phone].  Spoke with patient and went over Prolia Benefits. States that she has an appt for July already scheduled.   Doloris Hall,  LPN

## 2017-08-08 ENCOUNTER — Ambulatory Visit (INDEPENDENT_AMBULATORY_CARE_PROVIDER_SITE_OTHER): Payer: Medicare Other | Admitting: Pharmacist Clinician (PhC)/ Clinical Pharmacy Specialist

## 2017-08-08 DIAGNOSIS — Z7901 Long term (current) use of anticoagulants: Secondary | ICD-10-CM

## 2017-08-08 DIAGNOSIS — Z5181 Encounter for therapeutic drug level monitoring: Secondary | ICD-10-CM

## 2017-08-08 DIAGNOSIS — D6851 Activated protein C resistance: Secondary | ICD-10-CM | POA: Diagnosis not present

## 2017-08-08 LAB — POCT INR: INR: 3.1 — AB (ref 2.0–3.0)

## 2017-08-08 NOTE — Patient Instructions (Signed)
Description   Continue taking 1 tablet daily except for 1/2 tablet each Monday,Wednesday & Friday. Repeat INR in 6 weeks.

## 2017-08-09 ENCOUNTER — Other Ambulatory Visit: Payer: Self-pay | Admitting: Family Medicine

## 2017-08-09 NOTE — Telephone Encounter (Signed)
Called pharmacy and let the lady I spoke with know that we sent in a 30 day refill with 0 refills on the Rx and to please inform the patient to make an appointment for further refills.

## 2017-08-14 ENCOUNTER — Other Ambulatory Visit: Payer: Self-pay | Admitting: Family Medicine

## 2017-08-14 DIAGNOSIS — Z1231 Encounter for screening mammogram for malignant neoplasm of breast: Secondary | ICD-10-CM

## 2017-08-21 DIAGNOSIS — L570 Actinic keratosis: Secondary | ICD-10-CM | POA: Diagnosis not present

## 2017-08-21 DIAGNOSIS — L819 Disorder of pigmentation, unspecified: Secondary | ICD-10-CM | POA: Diagnosis not present

## 2017-09-04 ENCOUNTER — Other Ambulatory Visit: Payer: Self-pay | Admitting: Cardiovascular Disease

## 2017-09-05 ENCOUNTER — Ambulatory Visit: Payer: Medicare Other | Admitting: Podiatry

## 2017-09-07 ENCOUNTER — Other Ambulatory Visit: Payer: Self-pay | Admitting: Family Medicine

## 2017-09-18 ENCOUNTER — Ambulatory Visit (INDEPENDENT_AMBULATORY_CARE_PROVIDER_SITE_OTHER): Payer: Medicare Other | Admitting: Emergency Medicine

## 2017-09-18 DIAGNOSIS — M810 Age-related osteoporosis without current pathological fracture: Secondary | ICD-10-CM

## 2017-09-18 MED ORDER — DENOSUMAB 60 MG/ML ~~LOC~~ SOSY
60.0000 mg | PREFILLED_SYRINGE | Freq: Once | SUBCUTANEOUS | Status: AC
Start: 2017-09-18 — End: 2017-09-18
  Administered 2017-09-18: 60 mg via SUBCUTANEOUS

## 2017-09-18 NOTE — Progress Notes (Signed)
Robin Harrell is a 84 y.o. female presents to the office today for Prolia injection, per physician's orders. Prolia(med), 60mg /ml(dose),  SQ (route) was administered Left arm (location) today. Patient tolerated injection. Patient next injection due: 03/21/2018.  Leonidas Romberg, CMA

## 2017-09-19 ENCOUNTER — Encounter: Payer: Self-pay | Admitting: Family Medicine

## 2017-09-19 ENCOUNTER — Other Ambulatory Visit: Payer: Self-pay

## 2017-09-19 ENCOUNTER — Ambulatory Visit (INDEPENDENT_AMBULATORY_CARE_PROVIDER_SITE_OTHER): Payer: Medicare Other | Admitting: Pharmacist

## 2017-09-19 ENCOUNTER — Ambulatory Visit (INDEPENDENT_AMBULATORY_CARE_PROVIDER_SITE_OTHER): Payer: Medicare Other | Admitting: Family Medicine

## 2017-09-19 VITALS — BP 120/68 | HR 88 | Temp 98.5°F | Ht 62.0 in | Wt 134.4 lb

## 2017-09-19 DIAGNOSIS — D682 Hereditary deficiency of other clotting factors: Secondary | ICD-10-CM | POA: Diagnosis not present

## 2017-09-19 DIAGNOSIS — I10 Essential (primary) hypertension: Secondary | ICD-10-CM | POA: Diagnosis not present

## 2017-09-19 DIAGNOSIS — Z7901 Long term (current) use of anticoagulants: Secondary | ICD-10-CM | POA: Diagnosis not present

## 2017-09-19 DIAGNOSIS — Z5181 Encounter for therapeutic drug level monitoring: Secondary | ICD-10-CM

## 2017-09-19 DIAGNOSIS — M810 Age-related osteoporosis without current pathological fracture: Secondary | ICD-10-CM | POA: Diagnosis not present

## 2017-09-19 DIAGNOSIS — D6851 Activated protein C resistance: Secondary | ICD-10-CM

## 2017-09-19 DIAGNOSIS — L858 Other specified epidermal thickening: Secondary | ICD-10-CM

## 2017-09-19 LAB — POCT INR: INR: 2.8 (ref 2.0–3.0)

## 2017-09-19 NOTE — Progress Notes (Signed)
Subjective  CC:  Chief Complaint  Patient presents with  . Skin Problem    Bump on Back of neck x 2 months, red and scaly     HPI: Robin Harrell is a 84 y.o. female who presents to the office today to address the problems listed above in the chief complaint. And f/u chronic medical problems, 6 mo fu.   2 months with nodule on upper left back - tender and growing.   HTN: has been doing well.  On losartan and metoprolol. No cp. Diastolic dysfunction w/o heart failure. No palpitations. hld is well controlled on statin.  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   Osteoporosis: last dex 06/2015; tolerating proliz x 2 years.   Assessment  1. Keratoacanthoma   2. Long term (current) use of anticoagulants   3. Age-related osteoporosis without current pathological fracture   4. Essential hypertension   5. Factor V deficiency ([place])      Plan   keratoacanthoma:  Due to coumadin, refer to derm for excision. Education given. Likely benign  dexa for osteoporosis f/u on prolia. Last injection yesterday  HTN is well controlled. Reviewed bmp and cbc from this year.   HLD well controlled 01/2017  Continue coumadin and coumadin clinic.  Follow up: Return in about 5 months (around 02/19/2018) for complete physical.   Orders Placed This Encounter  Procedures  . DG Bone Density  . Ambulatory referral to Dermatology   No orders of the defined types were placed in this encounter.     I reviewed the patients updated PMH, FH, and SocHx.    Patient Active Problem List   Diagnosis Date Noted  . Hypothyroidism (acquired) 09/02/2014    Priority: High  . Hyperlipidemia 11/19/2012    Priority: High  . Long term (current) use of anticoagulants 05/10/2012    Priority: High  . Diastolic dysfunction 24/26/8341    Priority: High  . Factor V deficiency ([place]) 02/09/2010    Priority: High  . Osteoporosis 02/09/2010    Priority: High  . Essential hypertension  11/15/2007    Priority: High  . Chronic idiopathic thrombocytopenia (HCC) 04/14/2016    Priority: Medium  . Essential tremor 09/03/2015    Priority: Medium  . Edema of right lower extremity 02/01/2013    Priority: Medium  . Colon polyp 10/15/2012    Priority: Medium  . Insomnia 10/11/2011    Priority: Medium  . Osteoarthritis, multiple sites 06/30/2011    Priority: Medium  . DJD (degenerative joint disease), lumbar 10/14/2009    Priority: Medium  . Glaucoma 01/29/2008    Priority: Medium  . History of DVT (deep vein thrombosis) 11/15/2007    Priority: Medium  . GERD 11/15/2007    Priority: Medium  . COUGH, CHRONIC 11/15/2007    Priority: Medium  . History of recurrent UTIs 01/26/2017    Priority: Low  . History of respiratory failure 11/11/2014    Priority: Low  . Seasonal and perennial allergic rhinitis 11/15/2007    Priority: Low  . Aortic stenosis, mild 02/07/2017   Current Meds  Medication Sig  . brimonidine (ALPHAGAN) 0.15 % ophthalmic solution Place 1 drop into both eyes 2 (two) times daily.  . cetirizine (ZYRTEC) 10 MG tablet TAKE 1 TABLET BY MOUTH EVERY DAY  . chlorpheniramine (CHLOR-TRIMETON) 4 MG tablet Take by mouth.  . fluticasone (FLONASE) 50 MCG/ACT nasal spray Place 2 sprays into both nostrils daily.  Marland Kitchen ipratropium (ATROVENT) 0.03 %  nasal spray PLACE 2 SPRAYS INTO BOTH NOSTRILS 3 (THREE) TIMES DAILY AS NEEDED FOR RHINITIS.  Marland Kitchen latanoprost (XALATAN) 0.005 % ophthalmic solution Place 1 drop into both eyes at bedtime.   Marland Kitchen levothyroxine (SYNTHROID, LEVOTHROID) 25 MCG tablet TAKE 1 TABLET BY MOUTH EVERY DAY  . losartan (COZAAR) 50 MG tablet TAKE 1 TABLET (50 MG TOTAL) BY MOUTH DAILY. NEED OV.  . metoprolol tartrate (LOPRESSOR) 25 MG tablet TAKE 1 TABLET (25 MG TOTAL) BY MOUTH 2 (TWO) TIMES DAILY. KEEP OV.  . Multiple Vitamin (MULTIVITAMIN) tablet Take by mouth.  . warfarin (COUMADIN) 7.5 MG tablet TAKE 1/2 TO 1 TABLET BY MOUTH DAILY AS DIRECTED BY COUMADIN CLINIC    . [DISCONTINUED] losartan (COZAAR) 50 MG tablet TAKE 1 TABLET (50 MG TOTAL) BY MOUTH DAILY. NEED OV.    Allergies: Patient is allergic to codeine and erythromycin. Family History: Patient family history includes Allergic rhinitis in her sister; Breast cancer in her daughter; Cirrhosis in her father; Coronary artery disease in her maternal grandfather and paternal grandfather; Heart disease in her paternal grandmother; Lung cancer in her father; Skin cancer in her brother; Throat cancer in her maternal grandfather and paternal grandfather. Social History:  Patient  reports that she quit smoking about 59 years ago. Her smoking use included cigarettes. She quit after 1.00 year of use. She has never used smokeless tobacco. She reports that she drinks alcohol. She reports that she does not use drugs.  Review of Systems: Constitutional: Negative for fever malaise or anorexia Cardiovascular: negative for chest pain Respiratory: negative for SOB or persistent cough Gastrointestinal: negative for abdominal pain  Objective  Vitals: BP 120/68   Pulse 88   Temp 98.5 F (36.9 C)   Ht 5\' 2"  (1.575 m)   Wt 134 lb 6.4 oz (61 kg)   LMP  (LMP Unknown)   SpO2 99%   BMI 24.58 kg/m  General: no acute distress , A&Ox3 HEENT: PEERL, conjunctiva normal, Oropharynx moist,neck is supple Cardiovascular:  RRR with murmur no gallop.  Respiratory:  Good breath sounds bilaterally, CTAB with normal respiratory effort Skin:  Warm, no rashes. Dome shaped nodule with erythema and central pit/crust on left upper back     Commons side effects, risks, benefits, and alternatives for medications and treatment plan prescribed today were discussed, and the patient expressed understanding of the given instructions. Patient is instructed to call or message via MyChart if he/she has any questions or concerns regarding our treatment plan. No barriers to understanding were identified. We discussed Red Flag symptoms and signs  in detail. Patient expressed understanding regarding what to do in case of urgent or emergency type symptoms.   Medication list was reconciled, printed and provided to the patient in AVS. Patient instructions and summary information was reviewed with the patient as documented in the AVS. This note was prepared with assistance of Dragon voice recognition software. Occasional wrong-word or sound-a-like substitutions may have occurred due to the inherent limitations of voice recognition software

## 2017-09-19 NOTE — Patient Instructions (Addendum)
Please return in December for your physical with labs. Come fasting.  Also due for AWV at patient's convenience. Please schedule.   If you have any questions or concerns, please don't hesitate to send me a message via MyChart or call the office at [phone]. Thank you for visiting with Robin Harrell today! It's our pleasure caring for you.  We will call you with information regarding your referral appointment. Dermatology: Dr. Elvera Lennox and for bone density scan at the breast center.  If you do not hear from Robin Harrell within the next 2 weeks, please let me know. It can take 1-2 weeks to get appointments set up with the specialists.

## 2017-09-21 ENCOUNTER — Ambulatory Visit
Admission: RE | Admit: 2017-09-21 | Discharge: 2017-09-21 | Disposition: A | Discharge status: Home / Self Care | Payer: Medicare Other | Source: Ambulatory Visit | Attending: Family Medicine | Admitting: Family Medicine

## 2017-09-21 DIAGNOSIS — Z1231 Encounter for screening mammogram for malignant neoplasm of breast: Secondary | ICD-10-CM | POA: Diagnosis not present

## 2017-09-22 ENCOUNTER — Other Ambulatory Visit: Payer: Self-pay | Admitting: Family Medicine

## 2017-09-22 DIAGNOSIS — R928 Other abnormal and inconclusive findings on diagnostic imaging of breast: Secondary | ICD-10-CM

## 2017-09-25 ENCOUNTER — Encounter: Payer: Self-pay | Admitting: Podiatry

## 2017-09-25 ENCOUNTER — Ambulatory Visit (INDEPENDENT_AMBULATORY_CARE_PROVIDER_SITE_OTHER): Payer: Medicare Other | Admitting: Podiatry

## 2017-09-25 DIAGNOSIS — D689 Coagulation defect, unspecified: Secondary | ICD-10-CM | POA: Diagnosis not present

## 2017-09-25 DIAGNOSIS — Q828 Other specified congenital malformations of skin: Secondary | ICD-10-CM

## 2017-09-25 DIAGNOSIS — M79676 Pain in unspecified toe(s): Secondary | ICD-10-CM | POA: Diagnosis not present

## 2017-09-25 DIAGNOSIS — B351 Tinea unguium: Secondary | ICD-10-CM | POA: Diagnosis not present

## 2017-09-27 ENCOUNTER — Other Ambulatory Visit: Payer: Self-pay | Admitting: Family Medicine

## 2017-09-27 NOTE — Progress Notes (Signed)
Subjective: 84 y.o. returns the office today for painful, elongated, thickened toenails which she cannot trim herself and for a painful callus to the right 4th toe.  She states that she has not noticed any redness or drainage from the toenail callus sites. No recent injury that she recalls.  She does use offloading pads intermittently which helps.   She is on Coumadin  Objective: AAO 3, NAD DP/PT pulses palpable, CRT less than 3 seconds Nails hypertrophic, dystrophic, elongated, brittle, discolored 10. There is tenderness overlying the nails 1-5 bilaterally. There is no surrounding erythema or drainage along the nail sites. Hyperkeratotic lesion distal left and right fourth toe with the right side worse than left.  Upon debridement there is no underlying ulceration, drainage or any signs of infection.  No underlying ulceration, drainage or any signs of infection noted today.  Hammertoe contractures present to either digit. No open lesions or pre-ulcerative lesions are identified. No pain with calf compression, swelling, warmth, erythema.  Assessment: Patient presents with symptomatic onychomycosis; pre-ulcerative callus  Plan: -Treatment options including alternatives, risks, complications were discussed -Nails sharply debrided 10 without complication/bleeding. -Pre-ulcerative callus sharply debrided x 2 without any complications or bleeding. Continue offloading.  -Discussed daily foot inspection. If there are any changes, to call the office immediately.  -Follow-up in 9 weeks or sooner if any problems are to arise. In the meantime, encouraged to call the office with any questions, concerns, changes symptoms.  Celesta Gentile, DPM

## 2017-09-29 ENCOUNTER — Ambulatory Visit
Admission: RE | Admit: 2017-09-29 | Discharge: 2017-09-29 | Disposition: A | Discharge status: Home / Self Care | Payer: Medicare Other | Source: Ambulatory Visit | Attending: Family Medicine | Admitting: Family Medicine

## 2017-09-29 DIAGNOSIS — M810 Age-related osteoporosis without current pathological fracture: Secondary | ICD-10-CM | POA: Diagnosis not present

## 2017-09-29 DIAGNOSIS — R928 Other abnormal and inconclusive findings on diagnostic imaging of breast: Secondary | ICD-10-CM

## 2017-09-29 DIAGNOSIS — N6489 Other specified disorders of breast: Secondary | ICD-10-CM | POA: Diagnosis not present

## 2017-09-29 DIAGNOSIS — R922 Inconclusive mammogram: Secondary | ICD-10-CM | POA: Diagnosis not present

## 2017-09-29 DIAGNOSIS — Z780 Asymptomatic menopausal state: Secondary | ICD-10-CM | POA: Diagnosis not present

## 2017-09-30 NOTE — Progress Notes (Signed)
Please call patient: I have reviewed his/her lab results. Bone density test results show that osteoporosis is stable over the last 2 years on Prolia. There has not been any significant further bone density lost. I recommend continuation of the prolia injections.

## 2017-10-02 ENCOUNTER — Telehealth: Payer: Self-pay | Admitting: *Deleted

## 2017-10-02 NOTE — Telephone Encounter (Signed)
Pt called for results. After reviewing results pt requested Dr. Jonni Sanger be made aware dermatologist can not see her until [name]. 2020.  Pt is questioning if Dr. Jonni Sanger can remove the keratocanthoma in office once Dr. Henrene Pastor takes her off Coumadin.

## 2017-10-03 ENCOUNTER — Other Ambulatory Visit: Payer: Self-pay

## 2017-10-03 DIAGNOSIS — L858 Other specified epidermal thickening: Secondary | ICD-10-CM

## 2017-10-03 NOTE — Telephone Encounter (Signed)
Please see if you can get her an appt sooner. Maybe [facility] with dr. Denna Haggard or kelly phillips?

## 2017-10-03 NOTE — Telephone Encounter (Signed)
Referral has been placed. 

## 2017-10-03 NOTE — Telephone Encounter (Signed)
Last OV 09/19/17, Next OV 01/30/18

## 2017-10-05 ENCOUNTER — Telehealth: Payer: Self-pay | Admitting: Podiatry

## 2017-10-05 NOTE — Telephone Encounter (Signed)
I'm a pt of Dr. Leigh Aurora and I would like to speak to his nurse Lattie Haw. It is in regards to a piece I put on my right foot 4th toe. I wanted to ask her if I could come get another one as this one split on me. Thank you. Have a good day. Bye.

## 2017-10-05 NOTE — Telephone Encounter (Signed)
This is [name] calling Lattie Haw back. She called me and I was unable to answer the phone. If she could call me back please at [phone]. Thank you.

## 2017-10-05 NOTE — Telephone Encounter (Signed)
Called and left a message for the patient to come by and get a pad for the 4th toe. Robin Harrell

## 2017-10-06 NOTE — Telephone Encounter (Signed)
Patient came by yesterday and got the pad. Lattie Haw

## 2017-10-11 NOTE — Telephone Encounter (Signed)
[facility] for Referral?  Robin Hall,  LPN

## 2017-10-11 NOTE — Telephone Encounter (Addendum)
Requesting status of referral to Dr Denna Haggard, please advise. Would still like to know if Dr Jonni Sanger if would be willing to remove? CB# [phone]

## 2017-10-12 ENCOUNTER — Other Ambulatory Visit: Payer: Self-pay | Admitting: Emergency Medicine

## 2017-10-13 NOTE — Telephone Encounter (Signed)
LM to [facility]  

## 2017-10-13 NOTE — Telephone Encounter (Signed)
Please ask angela to get pt into derm ASAP. Needs derm to remove lesion.

## 2017-10-17 NOTE — Telephone Encounter (Signed)
Can you follow-up with this referral

## 2017-10-18 ENCOUNTER — Other Ambulatory Visit: Payer: Self-pay | Admitting: Family Medicine

## 2017-10-18 NOTE — Telephone Encounter (Signed)
Pt has been scheduled at [facility] w/Jennifer Foley for 10/17 @ 10:30. Pt is aware of this appt.  Pt asking if it is ok to take one Vit E 1000 IU a day. Pt states that she has put herself on this. Please advise.

## 2017-10-22 ENCOUNTER — Other Ambulatory Visit: Payer: Self-pay | Admitting: Family Medicine

## 2017-10-25 ENCOUNTER — Other Ambulatory Visit: Payer: Self-pay | Admitting: Emergency Medicine

## 2017-10-25 NOTE — Telephone Encounter (Signed)
error 

## 2017-10-31 ENCOUNTER — Other Ambulatory Visit: Payer: Self-pay | Admitting: Emergency Medicine

## 2017-11-01 ENCOUNTER — Ambulatory Visit (INDEPENDENT_AMBULATORY_CARE_PROVIDER_SITE_OTHER): Payer: Medicare Other | Admitting: Pharmacist Clinician (PhC)/ Clinical Pharmacy Specialist

## 2017-11-01 DIAGNOSIS — Z7901 Long term (current) use of anticoagulants: Secondary | ICD-10-CM

## 2017-11-01 DIAGNOSIS — Z5181 Encounter for therapeutic drug level monitoring: Secondary | ICD-10-CM

## 2017-11-01 DIAGNOSIS — Z86718 Personal history of other venous thrombosis and embolism: Secondary | ICD-10-CM | POA: Diagnosis not present

## 2017-11-01 DIAGNOSIS — D6851 Activated protein C resistance: Secondary | ICD-10-CM

## 2017-11-01 LAB — POCT INR: INR: 3.2 — AB (ref 2.0–3.0)

## 2017-11-08 NOTE — Progress Notes (Signed)
Subjective:   Robin Harrell is a 84 y.o. female who presents for Medicare Annual (Subsequent) preventive examination.  Review of Systems:  No ROS.  Medicare Wellness Visit. Additional risk factors are reflected in the social history.  Cardiac Risk Factors include: hypertension;advanced age (>43men, >46 women);dyslipidemia;family history of premature cardiovascular disease;sedentary lifestyle   Sleep patterns: Sleeps 6-7 hours, up to void x 2.  Home Safety/Smoke Alarms: Feels safe in home. Smoke alarms in place.  Living environment; residence and Firearm Safety: Lives with husband in 1 story home. Rails at door.  Seat Belt Safety/Bike Helmet: Wears seat belt.   Female:   Pap-N/A      Mammo-09/29/2017, BI-RADS CATEGORY  1: Negative.       Dexa scan-09/29/2017, Osteoporosis.        CCS-Pt reports > 10 years ago. Polyps.       Objective:     Vitals: BP (!) 100/54 (BP Location: Left Arm, Patient Position: Sitting, Cuff Size: Normal)   Pulse 76   Ht 5\' 2"  (1.575 m)   Wt 135 lb 8 oz (61.5 kg)   LMP  (LMP Unknown)   SpO2 96%   BMI 24.78 kg/m   Body mass index is 24.78 kg/m.  Advanced Directives 11/09/2017 03/23/2016  Does Patient Have a Medical Advance Directive? Yes No  Type of Paramedic of [place];Living will -  Copy of [place] in Chart? Yes -  Would patient like information on creating a medical advance directive? - No - Patient declined    Tobacco Social History   Tobacco Use  Smoking Status Former Smoker  . Years: 1.00  . Types: Cigarettes  . Last attempt to quit: 02/21/1958  . Years since quitting: 59.7  Smokeless Tobacco Never Used     Counseling given: Not Answered    Past Medical History:  Diagnosis Date  . Allergic rhinitis   . Cough   . Difficulty in swallowing    w/ occasional aspiration  . DVT (deep venous thrombosis) ([place])   . Dyslipidemia   . Factor V Leiden deficiency    lifelong coumadin  .  GERD (gastroesophageal reflux disease)   . History of nuclear stress test 12/28/2007   lexiscan; low risk   . HTN (hypertension)   . PND (post-nasal drip)   . Pulmonary embolism ([place])   . PVC's (premature ventricular contractions)   . Thyroid disease    Past Surgical History:  Procedure Laterality Date  . APPENDECTOMY    . BUNIONECTOMY    . CATARACT EXTRACTION W/ INTRAOCULAR LENS IMPLANT Right 11/02/2013  . CHOLECYSTECTOMY  1973  . ESOPHAGEAL DILATION    . HAMMER TOE SURGERY    . history of sleep study  12/27/2007   AHI during total sleep 0.9/hr and REM 1.5/hr; RDI during total sleep6.0/hr and REM 6.1/hr  . 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 TRANSTHORACIC ECHOCARDIOGRAM  12/28/2007   borderline conc LVH with normal systolic function; MV mod thickened with mild MVP & mild MR   Family History  Problem Relation Age of Onset  . Lung cancer Father   . Cirrhosis Father   . Coronary artery disease Maternal Grandfather        MI  . Throat cancer Maternal Grandfather   . Heart disease Paternal Grandmother   . Coronary artery disease Paternal Grandfather        MI  . Throat cancer Paternal Grandfather   . Allergic rhinitis  Sister   . Skin cancer Brother   . Breast cancer Daughter 3   Social History   Socioeconomic History  . Marital status: Married    Spouse name: Not on file  . Number of children: 4  . Years of education: 13.5  . Highest education level: Not on file  Occupational History  . 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 Financial resource strain: Not on file  . Food insecurity:    Worry: Not on file    Inability: Not on file  . Transportation needs:    Medical: Not on file    Non-medical: Not on file  Tobacco Use  . Smoking status: Former Smoker    Years: 1.00    Types: Cigarettes    Last attempt to quit: 02/21/1958    Years since quitting: 59.7  . Smokeless tobacco: Never Used  Substance and Sexual Activity  . Alcohol use: Yes     Comment: rareley one glass of wine  . Drug use: No  . Sexual activity: Never  Lifestyle  . Physical activity:    Days per week: Not on file    Minutes per session: Not on file  . Stress: Not on file  Relationships  . Social connections:    Talks on phone: Not on file    Gets together: Not on file    Attends religious service: Not on file    Active member of club or organization: Not on file    Attends meetings of clubs or organizations: Not on file    Relationship status: Not on file  Other Topics Concern  . Not on file  Social History Narrative  . Not on file    Outpatient Encounter Medications as of 11/09/2017  Medication Sig  . atorvastatin (LIPITOR) 10 MG tablet Take 1 tablet (10 mg total) by mouth daily at 6 PM. KEEP OV.  . brimonidine (ALPHAGAN) 0.15 % ophthalmic solution Place 1 drop into both eyes 2 (two) times daily.  . cetirizine (ZYRTEC) 10 MG tablet TAKE 1 TABLET BY MOUTH EVERY DAY  . chlorpheniramine (CHLOR-TRIMETON) 4 MG tablet Take by mouth.  Marland Kitchen ipratropium (ATROVENT) 0.03 % nasal spray PLACE 2 SPRAYS INTO BOTH NOSTRILS 3 (THREE) TIMES DAILY AS NEEDED FOR RHINITIS.  Marland Kitchen latanoprost (XALATAN) 0.005 % ophthalmic solution Place 1 drop into both eyes at bedtime.   Marland Kitchen levothyroxine (SYNTHROID, LEVOTHROID) 25 MCG tablet TAKE 1 TABLET BY MOUTH EVERY DAY  . losartan (COZAAR) 50 MG tablet Take 1 tablet (50 mg total) by mouth daily.  . metoprolol tartrate (LOPRESSOR) 25 MG tablet TAKE 1 TABLET (25 MG TOTAL) BY MOUTH 2 (TWO) TIMES DAILY. KEEP OV.  . multivitamin-iron-minerals-folic acid (CENTRUM) chewable tablet Chew 1 tablet by mouth daily.  . vitamin E (VITAMIN E) 1000 UNIT capsule Take 1,000 Units by mouth daily.  Marland Kitchen warfarin (COUMADIN) 7.5 MG tablet TAKE 1/2 TO 1 TABLET BY MOUTH DAILY AS DIRECTED BY COUMADIN CLINIC  . [DISCONTINUED] Multiple Vitamin (MULTIVITAMIN) tablet Take by mouth.  . fluticasone (FLONASE) 50 MCG/ACT nasal spray SPRAY 2 SPRAYS INTO EACH NOSTRIL EVERY DAY  (Patient not taking: Reported on 11/09/2017)  . [DISCONTINUED] metoprolol tartrate (LOPRESSOR) 25 MG tablet TAKE 1 TABLET (25 MG TOTAL) BY MOUTH 2 (TWO) TIMES DAILY. KEEP OV.   No facility-administered encounter medications on file as of 11/09/2017.     Activities of Daily Living In your present state of health, do you have any difficulty performing the following activities: 11/09/2017  Hearing? N  Vision? N  Difficulty concentrating or making decisions? N  Walking or climbing stairs? Y  Dressing or bathing? N  Doing errands, shopping? N  Preparing Food and eating ? N  Using the Toilet? N  In the past six months, have you accidently leaked urine? Y  Do you have problems with loss of bowel control? N  Managing your Medications? N  Managing your Finances? N  Housekeeping or managing your Housekeeping? N  Some recent data might be hidden    Patient Care Team: [name], MD as PCP - General (Family Medicine) Trula Slade, DPM as Consulting Physician (Podiatry) Lamonte Sakai Rose Fillers, MD as Consulting Physician (Pulmonary Disease) Lorretta Harp, MD as Consulting Physician (Cardiology)    Assessment:   This is a routine wellness examination for Erricka.  Exercise Activities and Dietary recommendations Current Exercise Habits: Home exercise routine, Type of exercise: walking(laundry; walks to corner), Exercise limited by: orthopedic condition(s)   Diet (meal preparation, eat out, water intake, caffeinated beverages, dairy products, fruits and vegetables): Drinks Cran-Apple juice, apple juice, hot tea and water.   Breakfast: muffins; waffles Lunch: light lunch Dinner: protein and veggies; tacos  Goals    . Patient Stated     Start [place] chi again, improve balance.        Fall Risk Fall Risk  11/09/2017 01/26/2017  Falls in the past year? Yes Yes  Comment fell over sidewalk -  Number falls in past yr: 2 or more 2 or more  Injury with Fall? No Yes  Risk Factor Category   - High Fall Risk  Risk for fall due to : - Impaired balance/gait  Follow up Falls prevention discussed Falls prevention discussed    Depression Screen PHQ 2/9 Scores 11/09/2017 01/26/2017  PHQ - 2 Score 0 0     Cognitive Function MMSE - Mini Mental State Exam 11/09/2017  Orientation to time 5  Orientation to Place 5  Registration 3  Attention/ Calculation 5  Recall 2  Language- name 2 objects 2  Language- repeat 1  Language- follow 3 step command 3  Language- read & follow direction 1  Write a sentence 1  Copy design 1  Total score 29        Immunization History  Administered Date(s) Administered  . Influenza Split 11/21/2008, 02/09/2010, 10/24/2011, 11/21/2012  . Influenza Whole 11/27/2008  . Influenza, High Dose Seasonal PF 11/12/2011, 11/29/2012, 11/05/2013, 01/05/2015, 11/16/2015, 10/21/2016, 11/09/2017  . Influenza,inj,Quad PF,6+ Mos 11/23/2015  . Influenza-Unspecified 02/02/2011  . Pneumococcal Conjugate-13 04/23/2013  . Pneumococcal Polysaccharide-23 02/21/2005  . Tdap 09/22/2007  . Zoster 04/22/2010  . Zoster Recombinat (Shingrix) 07/11/2016, 01/27/2017    Screening Tests Health Maintenance  Topic Date Due  . INFLUENZA VACCINE  09/21/2017  . TETANUS/TDAP  11/10/2018 (Originally 09/21/2017)  . DEXA SCAN  09/30/2019  . PNA vac Low Risk Adult  Completed        Plan:    Bring a copy of your living will and/or healthcare power of attorney to your next office visit.  Continue doing brain stimulating activities (puzzles, reading, adult coloring books, staying active) to keep memory sharp.   I have personally reviewed and noted the following in the patient's chart:   . Medical and social history . Use of alcohol, tobacco or illicit drugs  . Current medications and supplements . Functional ability and status . Nutritional status . Physical activity . Advanced directives . List of other physicians . Hospitalizations, surgeries,  and ER visits in previous  12 months . Vitals . Screenings to include cognitive, depression, and falls . Referrals and appointments  In addition, I have reviewed and discussed with patient certain preventive protocols, quality metrics, and best practice recommendations. A written personalized care plan for preventive services as well as general preventive health recommendations were provided to patient.     Gerilyn Nestle, RN  11/09/2017  PCP Notes: -Pt started taking Vitamin E 1000 U approximately 1 month ago -F/U with PCP 01/30/18

## 2017-11-09 ENCOUNTER — Other Ambulatory Visit: Payer: Self-pay | Admitting: Cardiovascular Disease

## 2017-11-09 ENCOUNTER — Other Ambulatory Visit: Payer: Self-pay

## 2017-11-09 ENCOUNTER — Ambulatory Visit (INDEPENDENT_AMBULATORY_CARE_PROVIDER_SITE_OTHER): Payer: Medicare Other

## 2017-11-09 DIAGNOSIS — Z23 Encounter for immunization: Secondary | ICD-10-CM

## 2017-11-09 DIAGNOSIS — Z0000 Encounter for general adult medical examination without abnormal findings: Secondary | ICD-10-CM | POA: Diagnosis not present

## 2017-11-09 NOTE — Patient Instructions (Addendum)

## 2017-11-09 NOTE — Progress Notes (Signed)
I have reviewed the documentation from the recent AWV done by Kim Broome; I agree with the documentation and will follow up on any recommendations or abnormal findings as suggested.  

## 2017-11-11 ENCOUNTER — Other Ambulatory Visit: Payer: Self-pay | Admitting: Family Medicine

## 2017-11-22 DIAGNOSIS — L929 Granulomatous disorder of the skin and subcutaneous tissue, unspecified: Secondary | ICD-10-CM | POA: Diagnosis not present

## 2017-11-22 DIAGNOSIS — L570 Actinic keratosis: Secondary | ICD-10-CM | POA: Diagnosis not present

## 2017-11-22 DIAGNOSIS — L821 Other seborrheic keratosis: Secondary | ICD-10-CM | POA: Diagnosis not present

## 2017-11-22 DIAGNOSIS — C44622 Squamous cell carcinoma of skin of right upper limb, including shoulder: Secondary | ICD-10-CM | POA: Diagnosis not present

## 2017-11-27 ENCOUNTER — Ambulatory Visit (INDEPENDENT_AMBULATORY_CARE_PROVIDER_SITE_OTHER): Payer: Medicare Other | Admitting: Podiatry

## 2017-11-27 ENCOUNTER — Encounter: Payer: Self-pay | Admitting: Podiatry

## 2017-11-27 DIAGNOSIS — M79676 Pain in unspecified toe(s): Secondary | ICD-10-CM | POA: Diagnosis not present

## 2017-11-27 DIAGNOSIS — D689 Coagulation defect, unspecified: Secondary | ICD-10-CM

## 2017-11-27 DIAGNOSIS — Q828 Other specified congenital malformations of skin: Secondary | ICD-10-CM

## 2017-11-27 DIAGNOSIS — B351 Tinea unguium: Secondary | ICD-10-CM | POA: Diagnosis not present

## 2017-11-28 NOTE — Progress Notes (Signed)
Subjective: 84 y.o. returns the office today for painful, elongated, thickened toenails which she cannot trim herself and for a painful callus to the right 4th toe.  She states that she has not noticed any redness or drainage from the toenail callus sites. No recent injury that she recalls.  She does use offloading pads intermittently which helps.   She is on Coumadin  Is any changes and she is stable otherwise.  Objective: AAO 3, NAD DP/PT pulses palpable, CRT less than 3 seconds Nails hypertrophic, dystrophic, elongated, brittle, discolored 10. There is tenderness overlying the nails 1-5 bilaterally. There is no surrounding erythema or drainage along the nail sites. Hyperkeratotic lesion distal left and right fourth toe with the right side worse than left.  Upon debridement there is no underlying ulceration, drainage or any signs of infection.  No underlying ulceration, drainage or any signs of infection noted today.  Hammertoe contractures present to either digit. No open lesions or pre-ulcerative lesions are identified. No pain with calf compression, swelling, warmth, erythema.  Assessment: Patient presents with symptomatic onychomycosis; pre-ulcerative callus  Plan: -Treatment options including alternatives, risks, complications were discussed -Nails sharply debrided 10 without complication/bleeding. -Pre-ulcerative callus sharply debrided x 2 without any complications or bleeding. Continue offloading.  -Discussed daily foot inspection. If there are any changes, to call the office immediately.  -Follow-up in 9 weeks or sooner if any problems are to arise. In the meantime, encouraged to call the office with any questions, concerns, changes symptoms.  Celesta Gentile, DPM

## 2017-12-02 ENCOUNTER — Other Ambulatory Visit: Payer: Self-pay | Admitting: Cardiovascular Disease

## 2017-12-07 ENCOUNTER — Other Ambulatory Visit: Payer: Self-pay | Admitting: Family Medicine

## 2017-12-13 ENCOUNTER — Ambulatory Visit (INDEPENDENT_AMBULATORY_CARE_PROVIDER_SITE_OTHER): Payer: Medicare Other | Admitting: Pharmacist Clinician (PhC)/ Clinical Pharmacy Specialist

## 2017-12-13 DIAGNOSIS — Z7901 Long term (current) use of anticoagulants: Secondary | ICD-10-CM | POA: Diagnosis not present

## 2017-12-13 DIAGNOSIS — Z86718 Personal history of other venous thrombosis and embolism: Secondary | ICD-10-CM

## 2017-12-13 DIAGNOSIS — D6851 Activated protein C resistance: Secondary | ICD-10-CM

## 2017-12-13 DIAGNOSIS — Z5181 Encounter for therapeutic drug level monitoring: Secondary | ICD-10-CM

## 2017-12-13 LAB — POCT INR: INR: 3 (ref 2.0–3.0)

## 2017-12-22 DIAGNOSIS — H26491 Other secondary cataract, right eye: Secondary | ICD-10-CM | POA: Diagnosis not present

## 2017-12-22 DIAGNOSIS — Z961 Presence of intraocular lens: Secondary | ICD-10-CM | POA: Diagnosis not present

## 2017-12-22 DIAGNOSIS — H401131 Primary open-angle glaucoma, bilateral, mild stage: Secondary | ICD-10-CM | POA: Diagnosis not present

## 2017-12-23 ENCOUNTER — Other Ambulatory Visit: Payer: Self-pay | Admitting: Cardiovascular Disease

## 2017-12-25 NOTE — Telephone Encounter (Signed)
Rx(s) sent to pharmacy electronically.  

## 2017-12-31 ENCOUNTER — Other Ambulatory Visit: Payer: Self-pay | Admitting: Family Medicine

## 2018-01-09 DIAGNOSIS — H26491 Other secondary cataract, right eye: Secondary | ICD-10-CM | POA: Diagnosis not present

## 2018-01-24 ENCOUNTER — Ambulatory Visit (INDEPENDENT_AMBULATORY_CARE_PROVIDER_SITE_OTHER): Payer: Medicare Other | Admitting: Pharmacist

## 2018-01-24 DIAGNOSIS — D6851 Activated protein C resistance: Secondary | ICD-10-CM | POA: Diagnosis not present

## 2018-01-24 DIAGNOSIS — Z5181 Encounter for therapeutic drug level monitoring: Secondary | ICD-10-CM

## 2018-01-24 DIAGNOSIS — Z7901 Long term (current) use of anticoagulants: Secondary | ICD-10-CM | POA: Diagnosis not present

## 2018-01-24 LAB — POCT INR: INR: 3.9 — AB (ref 2.0–3.0)

## 2018-01-29 ENCOUNTER — Ambulatory Visit (INDEPENDENT_AMBULATORY_CARE_PROVIDER_SITE_OTHER): Payer: Medicare Other | Admitting: Podiatry

## 2018-01-29 ENCOUNTER — Encounter: Payer: Self-pay | Admitting: Podiatry

## 2018-01-29 DIAGNOSIS — B351 Tinea unguium: Secondary | ICD-10-CM | POA: Diagnosis not present

## 2018-01-29 DIAGNOSIS — Q828 Other specified congenital malformations of skin: Secondary | ICD-10-CM | POA: Diagnosis not present

## 2018-01-29 DIAGNOSIS — M79676 Pain in unspecified toe(s): Secondary | ICD-10-CM

## 2018-01-29 DIAGNOSIS — D689 Coagulation defect, unspecified: Secondary | ICD-10-CM

## 2018-01-29 NOTE — Progress Notes (Signed)
Subjective: 84 y.o. returns the office today for painful, elongated, thickened toenails which she cannot trim herself and for a painful callus to the right 3rd and 4th toe.  She states that she has not noticed any redness or drainage from the toenail callus sites. No recent injury that she recalls.  She does use offloading pads intermittently which helps.   She is on Coumadin  Objective: AAO 3, NAD DP/PT pulses palpable, CRT less than 3 seconds Nails hypertrophic, dystrophic, elongated, brittle, discolored 10. There is tenderness overlying the nails 1-5 bilaterally. There is no surrounding erythema or drainage along the nail sites. Hyperkeratotic lesion distal left and right third and  fourth toe with the right side worse than left.  Upon debridement there is no underlying ulceration, drainage or any signs of infection.  No underlying ulceration, drainage or any signs of infection noted today.  Hammertoe contractures present to either digit. No open lesions or pre-ulcerative lesions are identified. No pain with calf compression, swelling, warmth, erythema.  Assessment: Patient presents with symptomatic onychomycosis; pre-ulcerative callus  Plan: -Treatment options including alternatives, risks, complications were discussed -Nails sharply debrided 10 without complication/bleeding. -Pre-ulcerative callus sharply debrided x 2 without any complications or bleeding. Continue offloading. We made her cultures offloading pads. -Discussed daily foot inspection. If there are any changes, to call the office immediately.  -Follow-up in 9 weeks or sooner if any problems are to arise. In the meantime, encouraged to call the office with any questions, concerns, changes symptoms.  Celesta Gentile, DPM

## 2018-01-30 ENCOUNTER — Encounter: Payer: Self-pay | Admitting: Family Medicine

## 2018-01-30 ENCOUNTER — Ambulatory Visit (INDEPENDENT_AMBULATORY_CARE_PROVIDER_SITE_OTHER): Payer: Medicare Other | Admitting: Family Medicine

## 2018-01-30 ENCOUNTER — Other Ambulatory Visit: Payer: Self-pay

## 2018-01-30 VITALS — BP 132/80 | HR 84 | Temp 98.1°F | Wt 134.8 lb

## 2018-01-30 DIAGNOSIS — M810 Age-related osteoporosis without current pathological fracture: Secondary | ICD-10-CM

## 2018-01-30 DIAGNOSIS — I10 Essential (primary) hypertension: Secondary | ICD-10-CM | POA: Diagnosis not present

## 2018-01-30 DIAGNOSIS — D693 Immune thrombocytopenic purpura: Secondary | ICD-10-CM

## 2018-01-30 DIAGNOSIS — E7800 Pure hypercholesterolemia, unspecified: Secondary | ICD-10-CM

## 2018-01-30 DIAGNOSIS — Z7901 Long term (current) use of anticoagulants: Secondary | ICD-10-CM

## 2018-01-30 DIAGNOSIS — E039 Hypothyroidism, unspecified: Secondary | ICD-10-CM

## 2018-01-30 LAB — LIPID PANEL
CHOLESTEROL: 159 mg/dL (ref 0–200)
HDL: 69.9 mg/dL (ref >39.00)
LDL CALC: 76 mg/dL (ref 0–99)
NONHDL: 89.37
Total CHOL/HDL Ratio: 2
Triglycerides: 68 mg/dL (ref 0.0–149.0)
VLDL: 13.6 mg/dL (ref 0.0–40.0)

## 2018-01-30 LAB — CBC WITH DIFFERENTIAL/PLATELET
BASOS PCT: 1.1 % (ref 0.0–3.0)
Basophils Absolute: 0 10*3/uL (ref 0.0–0.1)
EOS PCT: 5.6 % — AB (ref 0.0–5.0)
Eosinophils Absolute: 0.2 10*3/uL (ref 0.0–0.7)
HCT: 36.9 % (ref 36.0–46.0)
Hemoglobin: 12.2 g/dL (ref 12.0–15.0)
LYMPHS ABS: 0.9 10*3/uL (ref 0.7–4.0)
Lymphocytes Relative: 20.4 % (ref 12.0–46.0)
MCHC: 33.2 g/dL (ref 30.0–36.0)
MCV: 92.6 fl (ref 78.0–100.0)
MONO ABS: 0.5 10*3/uL (ref 0.1–1.0)
Monocytes Relative: 11.9 % (ref 3.0–12.0)
NEUTROS PCT: 61 % (ref 43.0–77.0)
Neutro Abs: 2.5 10*3/uL (ref 1.4–7.7)
PLATELETS: 136 10*3/uL — AB (ref 150.0–400.0)
RBC: 3.98 Mil/uL (ref 3.87–5.11)
RDW: 13.4 % (ref 11.5–15.5)
WBC: 4.2 10*3/uL (ref 4.0–10.5)

## 2018-01-30 LAB — COMPREHENSIVE METABOLIC PANEL
ALT: 14 U/L (ref 0–35)
AST: 20 U/L (ref 0–37)
Albumin: 4 g/dL (ref 3.5–5.2)
Alkaline Phosphatase: 59 U/L (ref 39–117)
BUN: 19 mg/dL (ref 6–23)
CHLORIDE: 105 meq/L (ref 96–112)
CO2: 29 meq/L (ref 19–32)
Calcium: 9.2 mg/dL (ref 8.4–10.5)
Creatinine, Ser: 0.81 mg/dL (ref 0.40–1.20)
GFR: 71.94 mL/min (ref >60.00)
GLUCOSE: 92 mg/dL (ref 70–99)
POTASSIUM: 3.9 meq/L (ref 3.5–5.1)
Sodium: 141 mEq/L (ref 135–145)
Total Bilirubin: 0.9 mg/dL (ref 0.2–1.2)
Total Protein: 6.4 g/dL (ref 6.0–8.3)

## 2018-01-30 LAB — TSH: TSH: 2.11 u[IU]/mL (ref 0.35–4.50)

## 2018-01-30 LAB — VITAMIN D 25 HYDROXY (VIT D DEFICIENCY, FRACTURES): VITD: 39.98 ng/mL (ref 30.00–100.00)

## 2018-01-30 NOTE — Progress Notes (Signed)
I have reviewed results. Normal. Patient notified by letter. Please see letter for details. 

## 2018-01-30 NOTE — Progress Notes (Signed)
Lab results mailed to patient in letter. Normal results. No action / follow up needed on these results.

## 2018-01-30 NOTE — Progress Notes (Signed)
Subjective  Chief Complaint  Patient presents with  . Annual Exam    doing well, HM UTD, no complaints today, patient is fasting    HPI: Robin Harrell is a 84 y.o. female who presents to [place] at [facility] today for a Female Wellness Visit. Medicare cpe with labs  Wellness Visit: annual visit with health maintenance review and exam without Pap   84 year old female with hypertension, mild diastolic dysfunction with mild aortic stenosis, chronic anticoagulation due to factor V Leiden deficiency and history of DVT, osteoporosis on Prolia for the last 2 years, significant kyphosis and osteoarthritis, hyperlipidemia and hypothyroidism here for follow-up.  Overall continues to do well.  Lives independently.  Using a cane for long walks at this time.  Denies falls.  Denies chest pain, shortness of breath.  Reviewed recent annual wellness visit.  Hyperlipidemia and hypertension: Both been well controlled.  Consistently takes her medications without adverse effects.  Fasting today for blood work.  Mild hypothyroidism: On medications consistently.  Energy level is good.  No signs or symptoms of low or high thyroid.  Sleep is fair.  Mild essential tremor on beta-blocker.  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  Getting recurrent cutaneous horns.  Foot pain, followed by podiatry.  Assessment  1. Essential hypertension   2. Pure hypercholesterolemia   3. Long term (current) use of anticoagulants   4. Chronic idiopathic thrombocytopenia (HCC)   5. Hypothyroidism (acquired)   6. Age-related osteoporosis without current pathological fracture      Plan  Female Wellness Visit:  Age appropriate Health Maintenance and Prevention measures were discussed with patient. Included topics are cancer screening recommendations, ways to keep healthy (see AVS) including dietary and exercise recommendations, regular eye and dental care, use  of seat belts, and avoidance of moderate alcohol use and tobacco use.  Cancer screenings are up-to-date.  BMI: discussed patient's BMI and encouraged positive lifestyle modifications to help get to or maintain a target BMI.  HM needs and immunizations were addressed and ordered. See below for orders. See HM and immunization section for updates.  Routine labs and screening tests ordered including cmp, cbc and lipids where appropriate.  Discussed recommendations regarding Vit D and calcium supplementation (see AVS)  Osteoporosis: Stable by recent bone density.  Continue Prolia injections.  Check vitamin D levels and calcium today.  Hypertension hyperlipidemia: Well controlled.  Check labs today.  Hypothyroidism, euthyroid clinically.  History of idiopathic thrombocytopenia: Check CBC today.  Anticoagulated.  Follow up: Return in about 6 months (around 08/01/2018) for follow up Hypertension, recheck.   Orders Placed This Encounter  Procedures  . CBC with Differential/Platelet  . Comprehensive metabolic panel  . Lipid panel  . TSH  . VITAMIN D 25 Hydroxy (Vit-D Deficiency, Fractures)   No orders of the defined types were placed in this encounter.    Lifestyle: Body mass index is 24.66 kg/m. Wt Readings from Last 3 Encounters:  01/30/18 134 lb 12.8 oz (61.1 kg)  11/09/17 135 lb 8 oz (61.5 kg)  09/19/17 134 lb 6.4 oz (61 kg)   Diet: general, appetite remains good    Patient Active Problem List   Diagnosis Date Noted  . Hypothyroidism (acquired) 09/02/2014    Priority: High    Overview:  Started low dose replacemtn 05/2014   . Hyperlipidemia 11/19/2012    Priority: High    hyperlipidemia   . Long term (current) use of anticoagulants 05/10/2012  Priority: High  . Diastolic dysfunction 02/72/5366    Priority: High    Overview:  On beta-blocker, followed by Dr. Ancil Linsey; no h/o CHF   . Factor V deficiency ([place]) 02/09/2010    Priority: High    Overview:    Factor V Deficiency, lifelong coumadin, followed by coumadin clinic, Dr. Gwenlyn Found   . Osteoporosis 02/09/2010    Priority: High    Overview:  Dexa 08/2017: T = - 3.9 wrist; no significant decrease frome 2017, on Prolia x 2 years. Dexa 07/21/2015: T = - 3.4 left hip ( 33% decrease from -2.4 in 2015); [facility]: T = - 3.4 wrist, T = - 2.4 at hip; prolia, failed fosamax   . Essential hypertension 11/15/2007    Priority: High    Qualifier: Diagnosis of  By: Ronnald Ramp CNA/MA, Janett Billow     . Aortic stenosis, mild 02/07/2017    Priority: Medium    Echo 01/2017: nl wall motion, EF 60%, mild AS (tricuspid), mild MR, moderate diastolic dysfunction   . Chronic idiopathic thrombocytopenia (HCC) 04/14/2016    Priority: Medium  . Essential tremor 09/03/2015    Priority: Medium  . Edema of right lower extremity 02/01/2013    Priority: Medium    Overview:  Due to prior right TKR: Wears 15-9mmhg thigh high compression stockings.   . Colon polyp 10/15/2012    Priority: Medium    Overview:  Dr. Watt Climes, q 10 year colonoscopy   . Insomnia 10/11/2011    Priority: Medium  . Osteoarthritis, multiple sites 06/30/2011    Priority: Medium    Overview:  Hand and knees.  S/p right knee replacement, Dr. Hillery Aldo   . DJD (degenerative joint disease), lumbar 10/14/2009    Priority: Medium    Overview:  PT; declines surgery. Has been evaluated by Dr. Nelva Bush   . Glaucoma 01/29/2008    Priority: Medium  . History of DVT (deep vein thrombosis) 11/15/2007    Priority: Medium    Qualifier: Diagnosis of  By: Ronnald Ramp CNA/MA, Janett Billow     . GERD 11/15/2007    Priority: Medium    Qualifier: Diagnosis of  By: Ronnald Ramp CNA/MA, Janett Billow     . COUGH, CHRONIC 11/15/2007    Priority: Medium  . History of recurrent UTIs 01/26/2017    Priority: Low  . History of respiratory failure 11/11/2014    Priority: Low    Overview:  Due to RSV PNA 10/2014. Nl Echo.   . Seasonal and perennial allergic  rhinitis 11/15/2007    Priority: Low    Qualifier: Diagnosis of  By: Ronnald Ramp CNA/MA, [facility] Maintenance  Topic Date Due  . TETANUS/TDAP  11/10/2018 (Originally 09/21/2017)  . DEXA SCAN  09/30/2019  . INFLUENZA VACCINE  Completed  . PNA vac Low Risk Adult  Completed   Immunization History  Administered Date(s) Administered  . Influenza Split 11/21/2008, 02/09/2010, 10/24/2011, 11/21/2012  . Influenza Whole 11/27/2008  . Influenza, High Dose Seasonal PF 11/12/2011, 11/29/2012, 11/05/2013, 01/05/2015, 11/16/2015, 10/21/2016, 11/09/2017  . Influenza,inj,Quad PF,6+ Mos 11/23/2015  . Influenza-Unspecified 02/02/2011  . Pneumococcal Conjugate-13 04/23/2013  . Pneumococcal Polysaccharide-23 02/21/2005  . Tdap 09/22/2007  . Zoster 04/22/2010  . Zoster Recombinat (Shingrix) 07/11/2016, 01/27/2017   We updated and reviewed the patient's past history in detail and it is documented below. Allergies: Patient is allergic to codeine and erythromycin. Past Medical History Patient  has a past medical history of Allergic rhinitis, Cough, Difficulty in swallowing,  DVT (deep venous thrombosis) ([place]), Dyslipidemia, Factor V Leiden deficiency, GERD (gastroesophageal reflux disease), History of nuclear stress test (12/28/2007), HTN (hypertension), PND (post-nasal drip), Pulmonary embolism ([place]), PVC's (premature ventricular contractions), and Thyroid disease. Past Surgical History Patient  has a past surgical history that includes Cholecystectomy (1973); history of sleep study (12/27/2007); Bunionectomy; Hammer toe surgery; Replacement total knee (01/11/2002); transthoracic echocardiogram (12/28/2007); Appendectomy; Esophageal dilation; and Cataract extraction w/ intraocular lens implant (Right, 11/02/2013). Family History: Patient family history includes Allergic rhinitis in her sister; Breast cancer (age of onset: 50) in her daughter; Cirrhosis in her father; Coronary artery disease in her  maternal grandfather and paternal grandfather; Heart disease in her paternal grandmother; Lung cancer in her father; Skin cancer in her brother; Throat cancer in her maternal grandfather and paternal grandfather. Social History:  Patient  reports that she quit smoking about 59 years ago. Her smoking use included cigarettes. She quit after 1.00 year of use. She has never used smokeless tobacco. She reports that she drinks alcohol. She reports that she does not use drugs.  Review of Systems: Constitutional: negative for fever or malaise Ophthalmic: negative for photophobia, double vision or loss of vision Cardiovascular: negative for chest pain, dyspnea on exertion, or new LE swelling Respiratory: negative for SOB or persistent cough Gastrointestinal: negative for abdominal pain, change in bowel habits or melena Genitourinary: negative for dysuria or gross hematuria, no abnormal uterine bleeding or disharge Musculoskeletal: negative for new gait disturbance or muscular weakness Integumentary: negative for new or persistent rashes, no breast lumps Neurological: negative for TIA or stroke symptoms Psychiatric: negative for SI or delusions Allergic/Immunologic: negative for hives  Patient Care Team    Relationship Specialty Notifications Start End  [name], MD PCP - General Family Medicine  07/05/12   Trula Slade, DPM Consulting Physician Podiatry  11/09/17   Collene Gobble, MD Consulting Physician Pulmonary Disease  11/09/17   Lorretta Harp, MD Consulting Physician Cardiology  11/09/17   Harriett Sine, MD Consulting Physician Dermatology  01/30/18     Objective  Vitals: BP 132/80   Pulse 84   Temp 98.1 F (36.7 C)   Wt 134 lb 12.8 oz (61.1 kg)   LMP  (LMP Unknown)   SpO2 98%   BMI 24.66 kg/m  General:  Well developed, well nourished, no acute distress  Psych:  Alert and orientedx3,normal mood and affect HEENT:  Normocephalic, atraumatic, non-icteric sclera,  PERRL, oropharynx is clear without mass or exudate, supple neck without adenopathy, mass or thyromegaly Cardiovascular:  Normal S1, S2, RRR without gallop,+ 2/6 systolic murmur, Respiratory:  Good breath sounds bilaterally, CTAB with normal respiratory effort Gastrointestinal: normal bowel sounds, soft, non-tender, no noted masses. No HSM MSK: kyphotic,  Spine and CVA region are nontender Skin:  Warm, no rashes or suspicious lesions noted, cutaneous horn on right arm Neurologic:    Mental status is normal. CN 2-11 are normal. Gross motor and sensory exams are normal.small stepped gait. Neg romberg, uses arm to stand from seated position  Commons side effects, risks, benefits, and alternatives for medications and treatment plan prescribed today were discussed, and the patient expressed understanding of the given instructions. Patient is instructed to call or message via MyChart if he/she has any questions or concerns regarding our treatment plan. No barriers to understanding were identified. We discussed Red Flag symptoms and signs in detail. Patient expressed understanding regarding what to do in case of urgent or emergency type symptoms.   Medication  list was reconciled, printed and provided to the patient in AVS. Patient instructions and summary information was reviewed with the patient as documented in the AVS. This note was prepared with assistance of Dragon voice recognition software. Occasional wrong-word or sound-a-like substitutions may have occurred due to the inherent limitations of voice recognition software

## 2018-01-30 NOTE — Patient Instructions (Addendum)
Please return in 6 months for follow up of your hypertension and recheck.  You will return in January to get your Prolia injection.  If you have any questions or concerns, please don't hesitate to send me a message via MyChart or call the office at [phone]. Thank you for visiting with [place] today! It's our pleasure caring for you.  Happy holidays. Call me if you need anything.

## 2018-02-19 ENCOUNTER — Ambulatory Visit (INDEPENDENT_AMBULATORY_CARE_PROVIDER_SITE_OTHER): Payer: Medicare Other | Admitting: Pharmacist

## 2018-02-19 DIAGNOSIS — Z5181 Encounter for therapeutic drug level monitoring: Secondary | ICD-10-CM | POA: Diagnosis not present

## 2018-02-19 DIAGNOSIS — D6851 Activated protein C resistance: Secondary | ICD-10-CM

## 2018-02-19 DIAGNOSIS — Z7901 Long term (current) use of anticoagulants: Secondary | ICD-10-CM

## 2018-02-19 LAB — POCT INR: INR: 5.1 — AB (ref 2.0–3.0)

## 2018-02-19 NOTE — Patient Instructions (Signed)
HOLD warfarin dose today 12/30 and tomorrow 12/31, then decrease dose to 1/2 tablet daily except for 1 tablets on Tuesday and Thursday.  Repeat INR in 2 weeks.

## 2018-02-27 ENCOUNTER — Ambulatory Visit (INDEPENDENT_AMBULATORY_CARE_PROVIDER_SITE_OTHER): Payer: Medicare Other

## 2018-02-27 DIAGNOSIS — M159 Polyosteoarthritis, unspecified: Secondary | ICD-10-CM | POA: Diagnosis not present

## 2018-02-27 MED ORDER — DENOSUMAB 60 MG/ML ~~LOC~~ SOSY
60.0000 mg | PREFILLED_SYRINGE | Freq: Once | SUBCUTANEOUS | Status: AC
  Administered 2018-02-27: 60 mg via SUBCUTANEOUS

## 2018-02-27 NOTE — Progress Notes (Signed)
Administered Prolia subq left arm. Pt tolerated well.

## 2018-03-06 ENCOUNTER — Encounter (INDEPENDENT_AMBULATORY_CARE_PROVIDER_SITE_OTHER): Payer: Self-pay

## 2018-03-06 ENCOUNTER — Ambulatory Visit (INDEPENDENT_AMBULATORY_CARE_PROVIDER_SITE_OTHER): Payer: Medicare Other | Admitting: Pharmacist

## 2018-03-06 DIAGNOSIS — Z5181 Encounter for therapeutic drug level monitoring: Secondary | ICD-10-CM

## 2018-03-06 DIAGNOSIS — Z85828 Personal history of other malignant neoplasm of skin: Secondary | ICD-10-CM | POA: Diagnosis not present

## 2018-03-06 DIAGNOSIS — D6851 Activated protein C resistance: Secondary | ICD-10-CM | POA: Diagnosis not present

## 2018-03-06 DIAGNOSIS — Z7901 Long term (current) use of anticoagulants: Secondary | ICD-10-CM

## 2018-03-06 DIAGNOSIS — L814 Other melanin hyperpigmentation: Secondary | ICD-10-CM | POA: Diagnosis not present

## 2018-03-06 DIAGNOSIS — C44729 Squamous cell carcinoma of skin of left lower limb, including hip: Secondary | ICD-10-CM | POA: Diagnosis not present

## 2018-03-06 DIAGNOSIS — L821 Other seborrheic keratosis: Secondary | ICD-10-CM | POA: Diagnosis not present

## 2018-03-06 DIAGNOSIS — D0461 Carcinoma in situ of skin of right upper limb, including shoulder: Secondary | ICD-10-CM | POA: Diagnosis not present

## 2018-03-06 DIAGNOSIS — D1801 Hemangioma of skin and subcutaneous tissue: Secondary | ICD-10-CM | POA: Diagnosis not present

## 2018-03-06 DIAGNOSIS — L570 Actinic keratosis: Secondary | ICD-10-CM | POA: Diagnosis not present

## 2018-03-06 LAB — POCT INR: INR: 1.8 — AB (ref 2.0–3.0)

## 2018-03-08 ENCOUNTER — Other Ambulatory Visit: Payer: Self-pay | Admitting: Emergency Medicine

## 2018-03-12 ENCOUNTER — Telehealth: Payer: Self-pay | Admitting: Emergency Medicine

## 2018-03-12 MED ORDER — IPRATROPIUM BROMIDE 0.03 % NA SOLN: 2.0000 | Freq: Three times a day (TID) | NASAL | 0 refills | Status: DC | PRN

## 2018-03-12 NOTE — Telephone Encounter (Signed)
Patient is returning phone call.  Phone number is [phone].

## 2018-03-12 NOTE — Telephone Encounter (Signed)
Called and spoke with patient.  OV with Dr. Lamonte Sakai, 03/14/18, at 2:45pm.  Patinet aware office office new location. Refill for ipratropium nasal spray sent to preferred pharmacy.  Nothing further at this time.

## 2018-03-12 NOTE — Telephone Encounter (Signed)
Ok but due for appt  [facility]

## 2018-03-14 ENCOUNTER — Ambulatory Visit: Payer: Medicare Other | Admitting: Emergency Medicine

## 2018-03-15 ENCOUNTER — Encounter: Payer: Self-pay | Admitting: Emergency Medicine

## 2018-03-15 ENCOUNTER — Ambulatory Visit (INDEPENDENT_AMBULATORY_CARE_PROVIDER_SITE_OTHER): Payer: Medicare Other | Admitting: Emergency Medicine

## 2018-03-15 DIAGNOSIS — R05 Cough: Secondary | ICD-10-CM | POA: Diagnosis not present

## 2018-03-15 DIAGNOSIS — J3089 Other allergic rhinitis: Secondary | ICD-10-CM

## 2018-03-15 DIAGNOSIS — R059 Cough, unspecified: Secondary | ICD-10-CM

## 2018-03-15 DIAGNOSIS — J302 Other seasonal allergic rhinitis: Secondary | ICD-10-CM

## 2018-03-15 MED ORDER — IPRATROPIUM BROMIDE 0.03 % NA SOLN: 2.0000 | Freq: Three times a day (TID) | NASAL | 11 refills | Status: DC | PRN

## 2018-03-15 NOTE — Patient Instructions (Addendum)
We will refill your ipratropium NS, use 2 sprays each nostril 2 times a day Please continue your chlorpheniramine and Zyrtec as you have been taking them. Use caution when swallowing to avoid getting choked.  Follow with Dr. Lamonte Sakai in 12 months or sooner if you have any problems.

## 2018-03-15 NOTE — Assessment & Plan Note (Signed)
Has been fairly well-controlled as had her cough but flaring now because she ran out of ipratropium nasal spray.  We will restart this.  She uses it twice a day.  Continue her chlorpheniramine and Zyrtec as she is using them.

## 2018-03-15 NOTE — Addendum Note (Signed)
Addended by: Desmond Dike C on: 03/15/2018 02:18 PM   Modules accepted: Orders

## 2018-03-15 NOTE — Progress Notes (Signed)
Subjective:    Patient ID: Robin Harrell, female    DOB: 08-May-1933, 85 y.o.   MRN: [id number]  Cough    ROV 03/15/18 -- Robin Harrell is 84, minimal smoker, with a history of chronic thromboembolic disease and PE due to factor V Leiden deficiency.  Robin Harrell has chronic rhinitis, GERD with a hiatal hernia and associated chronic cough.  There is been some question of possible intermittent aspiration, choking contributing to her cough as well.  We have been treating her with ipratropium nasal spray.  Robin Harrell has been on Flonase in the past. Currently on chlorpheniramine and zyrtec. Robin Harrell ran out of atrovent NS last week - hoarseness, drainage, cough have flared. Robin Harrell remains on coumadin.    Review of Systems  Respiratory: Positive for cough.    As per HPI     Objective:   Physical Exam Vitals:   03/15/18 1350  BP: 110/62  Pulse: 68  SpO2: 96%  Weight: 138 lb (62.6 kg)  Height: 5\' 2"  (1.575 m)    Gen: Pleasant, well-nourished, in no distress,  normal affect  ENT: No lesions,  mouth clear,  oropharynx clear, some hoarse voice  Neck: No JVD,   Lungs: No use of accessory muscles, clear without rales or rhonchi  Cardiovascular: RRR, heart sounds normal, no murmur or gallops, no peripheral edema  Musculoskeletal: No deformities, no cyanosis or clubbing  Neuro: alert, non focal  Skin: Warm, no lesions or rashes   Current Outpatient Medications:  .  atorvastatin (LIPITOR) 10 MG tablet, Take 1 tablet (10 mg total) by mouth daily at 6 PM., Disp: 90 tablet, Rfl: 2 .  brimonidine (ALPHAGAN) 0.15 % ophthalmic solution, Place 1 drop into both eyes 2 (two) times daily., Disp: , Rfl: 3 .  cetirizine (ZYRTEC) 10 MG tablet, TAKE 1 TABLET BY MOUTH EVERY DAY, Disp: 90 tablet, Rfl: 1 .  chlorpheniramine (CHLOR-TRIMETON) 4 MG tablet, Take by mouth., Disp: , Rfl:  .  fluticasone (FLONASE) 50 MCG/ACT nasal spray, SPRAY 2 SPRAYS INTO EACH NOSTRIL EVERY DAY, Disp: 48 g, Rfl: 11 .  ipratropium (ATROVENT)  0.03 % nasal spray, Place 2 sprays into both nostrils 3 (three) times daily as needed for rhinitis., Disp: 30 mL, Rfl: 0 .  latanoprost (XALATAN) 0.005 % ophthalmic solution, Place 1 drop into both eyes at bedtime. , Disp: , Rfl:  .  levothyroxine (SYNTHROID, LEVOTHROID) 25 MCG tablet, TAKE 1 TABLET BY MOUTH EVERY DAY, Disp: 90 tablet, Rfl: 3 .  losartan (COZAAR) 50 MG tablet, Take 1 tablet (50 mg total) by mouth daily., Disp: 30 tablet, Rfl: 1 .  metoprolol tartrate (LOPRESSOR) 25 MG tablet, TAKE 1 TABLET (25 MG TOTAL) BY MOUTH 2 (TWO) TIMES DAILY. KEEP OV., Disp: 180 tablet, Rfl: 3 .  multivitamin-iron-minerals-folic acid (CENTRUM) chewable tablet, Chew 1 tablet by mouth daily., Disp: , Rfl:  .  vitamin E (VITAMIN E) 1000 UNIT capsule, Take 1,000 Units by mouth daily., Disp: , Rfl:  .  warfarin (COUMADIN) 7.5 MG tablet, TAKE 1/2 TO 1 TABLET BY MOUTH DAILY AS DIRECTED BY COUMADIN CLINIC, Disp: 90 tablet, Rfl: 0      Assessment & Plan:   Seasonal and perennial allergic rhinitis Has been fairly well-controlled as had her cough but flaring now because Robin Harrell ran out of ipratropium nasal spray.  We will restart this.  Robin Harrell uses it twice a day.  Continue her chlorpheniramine and Zyrtec as Robin Harrell is using them.  COUGH, CHRONIC Impacted by chronic rhinitis, occasionally some  dysphasia or near aspiration.  Robin Harrell denies much in the way of GERD and is not on reflux medication.  We will refill your ipratropium NS, use 2 sprays each nostril 2 times a day Please continue your chlorpheniramine and Zyrtec as you have been taking them. Use caution when swallowing to avoid getting choked.  Follow with Dr. Lamonte Sakai in 12 months or sooner if you have any problems.  Baltazar Apo, MD, PhD 03/15/2018, 2:14 PM [facility] [phone] or if no answer [phone]

## 2018-03-15 NOTE — Assessment & Plan Note (Signed)
Impacted by chronic rhinitis, occasionally some dysphasia or near aspiration.  She denies much in the way of GERD and is not on reflux medication.  We will refill your ipratropium NS, use 2 sprays each nostril 2 times a day Please continue your chlorpheniramine and Zyrtec as you have been taking them. Use caution when swallowing to avoid getting choked.  Follow with Dr. Lamonte Sakai in 12 months or sooner if you have any problems.

## 2018-03-24 ENCOUNTER — Other Ambulatory Visit: Payer: Self-pay | Admitting: Family Medicine

## 2018-04-02 ENCOUNTER — Encounter: Payer: Self-pay | Admitting: Podiatry

## 2018-04-02 ENCOUNTER — Ambulatory Visit (INDEPENDENT_AMBULATORY_CARE_PROVIDER_SITE_OTHER): Payer: Medicare Other | Admitting: Podiatry

## 2018-04-02 DIAGNOSIS — L84 Corns and callosities: Secondary | ICD-10-CM | POA: Diagnosis not present

## 2018-04-02 DIAGNOSIS — L97511 Non-pressure chronic ulcer of other part of right foot limited to breakdown of skin: Secondary | ICD-10-CM | POA: Diagnosis not present

## 2018-04-02 DIAGNOSIS — M79676 Pain in unspecified toe(s): Secondary | ICD-10-CM

## 2018-04-02 DIAGNOSIS — B351 Tinea unguium: Secondary | ICD-10-CM | POA: Diagnosis not present

## 2018-04-02 DIAGNOSIS — D689 Coagulation defect, unspecified: Secondary | ICD-10-CM | POA: Diagnosis not present

## 2018-04-02 NOTE — Progress Notes (Signed)
Subjective: Robin Harrell is a 85 y.o. y.o. female who presents today with painful, discolored, thick toenails and painful callus/corn which interfere with daily activities. Pain is aggravated when wearing enclosed shoe gear. Pain is relieved with periodic professional debridement.  Pt also presents with painful corn formation right 3rd and 4th digits and left 4th digit. Most painful today is her right 4th digit.  [name], MD is her PCP.    Current Outpatient Medications:  .  atorvastatin (LIPITOR) 10 MG tablet, Take 1 tablet (10 mg total) by mouth daily at 6 PM., Disp: 90 tablet, Rfl: 2 .  brimonidine (ALPHAGAN) 0.15 % ophthalmic solution, Place 1 drop into both eyes 2 (two) times daily., Disp: , Rfl: 3 .  cetirizine (ZYRTEC) 10 MG tablet, TAKE 1 TABLET BY MOUTH EVERY DAY, Disp: 90 tablet, Rfl: 1 .  chlorpheniramine (CHLOR-TRIMETON) 4 MG tablet, Take by mouth., Disp: , Rfl:  .  fluticasone (FLONASE) 50 MCG/ACT nasal spray, SPRAY 2 SPRAYS INTO EACH NOSTRIL EVERY DAY, Disp: 48 g, Rfl: 11 .  ipratropium (ATROVENT) 0.03 % nasal spray, Place 2 sprays into both nostrils 3 (three) times daily as needed for rhinitis., Disp: 30 mL, Rfl: 11 .  latanoprost (XALATAN) 0.005 % ophthalmic solution, Place 1 drop into both eyes at bedtime. , Disp: , Rfl:  .  levothyroxine (SYNTHROID, LEVOTHROID) 25 MCG tablet, TAKE 1 TABLET BY MOUTH EVERY DAY, Disp: 90 tablet, Rfl: 3 .  losartan (COZAAR) 50 MG tablet, TAKE 1 TABLET BY MOUTH EVERY DAY, Disp: 90 tablet, Rfl: 1 .  metoprolol tartrate (LOPRESSOR) 25 MG tablet, TAKE 1 TABLET (25 MG TOTAL) BY MOUTH 2 (TWO) TIMES DAILY. KEEP OV., Disp: 180 tablet, Rfl: 3 .  multivitamin-iron-minerals-folic acid (CENTRUM) chewable tablet, Chew 1 tablet by mouth daily., Disp: , Rfl:  .  vitamin E (VITAMIN E) 1000 UNIT capsule, Take 1,000 Units by mouth daily., Disp: , Rfl:  .  warfarin (COUMADIN) 7.5 MG tablet, TAKE 1/2 TO 1 TABLET BY MOUTH DAILY AS DIRECTED BY COUMADIN  CLINIC, Disp: 90 tablet, Rfl: 0   Allergies  Allergen Reactions  . Codeine Nausea Only  . Erythromycin Nausea And Vomiting     Objective: Vascular Examination: Capillary refill time less than 3 seconds x 10 digits.  Dorsalis pedis pulses and Posterior tibial pulses palpable b/l   No digital hair x 10 digits  Skin temperature gradient WNL b/l  Dermatological Examination: Skin with normal turgor, texture and tone b/l  Toenails 1-5 b/l discolored, thick, dystrophic with subungual debris and pain with palpation to nailbeds due to thickness of nails.  Hyperkeratotic lesions noted distal tip right 3rd and b/l 4th digits.  Right 3rd and 4th digits are noted to possess subdermal hemorrhage. No erythema, no edema, no drainage, no flocculence, no warmth.   Right 4th digit 0.5cm annular lesion with partial thickness ulceration noted with light bleeding.   Musculoskeletal: Muscle strength 5/5 to all LE muscle groups  Hammertoes 2-5 b/l  Neurological: Sensation intact with 10 gram monofilament.  Vibratory sensation intact.  Assessment: 1. Painful onychomycosis toenails 1-5 b/l in patient on blood thinner.  2. Partial thickness ulceration distal tip right 4th digit 3. Distal clavi right 3rd digit, left 4th digit 4. Coagulation defect  Plan: 1. Toenails 1-5 b/l were debrided in length and girth without iatrogenic bleeding. 2. Hyperkeratotic lesion debrided utilizing sterile chisel blade right 3rd digit, left 4th digit. 3. Partial thickness ulcer pared distal tip right 4th toe. Light bleeding  addressed with Lumicain Hemostatic solution. Cleansed with Wound Cleanser followed by alcohol. Light dressing applied which she may remove on tomorrow and start applying Neosporin once daily.  She was given a toe crest to assist digits of the right foot in achieving rectus position, thus avoiding pressure on distal tips of digits. 4. Patient to continue soft, supportive shoe gear 5. Patient to  report any pedal injuries to medical professional immediately. 6. Avoid self trimming due to use of blood thinner. 7. Follow up 2 weeks for check of right 4th toe partial thickness ulcer. 8.  Patient/POA to call should there be a concern in the interim.

## 2018-04-03 ENCOUNTER — Ambulatory Visit (INDEPENDENT_AMBULATORY_CARE_PROVIDER_SITE_OTHER): Payer: Medicare Other | Admitting: Pharmacist Clinician (PhC)/ Clinical Pharmacy Specialist

## 2018-04-03 DIAGNOSIS — Z7901 Long term (current) use of anticoagulants: Secondary | ICD-10-CM | POA: Diagnosis not present

## 2018-04-03 DIAGNOSIS — Z5181 Encounter for therapeutic drug level monitoring: Secondary | ICD-10-CM

## 2018-04-03 DIAGNOSIS — D6851 Activated protein C resistance: Secondary | ICD-10-CM

## 2018-04-03 LAB — POCT INR: INR: 1.9 — AB (ref 2.0–3.0)

## 2018-04-03 MED ORDER — WARFARIN SODIUM 7.5 MG PO TABS: ORAL_TABLET | ORAL | 1 refills | Status: DC

## 2018-04-03 NOTE — Patient Instructions (Signed)
Take 1.5 tablets today Tuesday Feb 11, then continue with 1/2 tablet daily except for 1 tablet on Tuesday ,Thursday and Saturday.  Repeat INR in 4 weeks.

## 2018-04-08 ENCOUNTER — Other Ambulatory Visit: Payer: Self-pay | Admitting: Emergency Medicine

## 2018-04-17 ENCOUNTER — Ambulatory Visit: Payer: Medicare Other | Admitting: Podiatry

## 2018-04-26 ENCOUNTER — Ambulatory Visit (INDEPENDENT_AMBULATORY_CARE_PROVIDER_SITE_OTHER): Payer: Medicare Other | Admitting: Podiatry

## 2018-04-26 DIAGNOSIS — L97511 Non-pressure chronic ulcer of other part of right foot limited to breakdown of skin: Secondary | ICD-10-CM

## 2018-04-26 NOTE — Patient Instructions (Signed)
Hammer Toe  Hammer toe is a change in the shape (a deformity) of your toe. The deformity causes the middle joint of your toe to stay bent. This causes pain, especially when you are wearing shoes. Hammer toe starts gradually. At first, the toe can be straightened. Gradually over time, the deformity becomes stiff and permanent. Early treatments to keep the toe straight may relieve pain. As the deformity becomes stiff and permanent, surgery may be needed to straighten the toe. What are the causes? Hammer toe is caused by abnormal bending of the toe joint that is closest to your foot. It happens gradually over time. This pulls on the muscles and connections (tendons) of the toe joint, making them weak and stiff. It is often related to wearing shoes that are too short or narrow and do not let your toes straighten. What increases the risk? You may be at greater risk for hammer toe if you: Are female. Are older. Wear shoes that are too small. Wear high-heeled shoes that pinch your toes. Are a Engineer, mining. Have a second toe that is longer than your big toe (first toe). Injure your foot or toe. Have arthritis. Have a family history of hammer toe. Have a nerve or muscle disorder. What are the signs or symptoms? The main symptoms of this condition are pain and deformity of the toe. The pain is worse when wearing shoes, walking, or running. Other symptoms may include: Corns or calluses over the bent part of the toe or between the toes. Redness and a burning feeling on the toe. An open sore that forms on the top of the toe. Not being able to straighten the toe. How is this diagnosed? This condition is diagnosed based on your symptoms and a physical exam. During the exam, your health care provider will try to straighten your toe to see how stiff the deformity is. You may also have tests, such as: A blood test to check for rheumatoid arthritis. An X-ray to show how severe the deformity is. How is this  treated? Treatment for this condition will depend on how stiff the deformity is. Surgery is often needed. However, sometimes a hammer toe can be straightened without surgery. Treatments that do not involve surgery include: Taping the toe into a straightened position. Using pads and cushions to protect the toe (orthotics). Wearing shoes that provide enough room for the toes. Doing toe-stretching exercises at home. Taking an NSAID to reduce pain and swelling. If these treatments do not help or the toe cannot be straightened, surgery is the next option. The most common surgeries used to straighten a hammer toe include: Arthroplasty. In this procedure, part of the joint is removed, and that allows the toe to straighten. Fusion. In this procedure, cartilage between the two bones of the joint is taken out and the bones are fused together into one longer bone. Implantation. In this procedure, part of the bone is removed and replaced with an implant to let the toe move again. Flexor tendon transfer. In this procedure, the tendons that curl the toes down (flexor tendons) are repositioned. Follow these instructions at home: Take over-the-counter and prescription medicines only as told by your health care provider. Do toe straightening and stretching exercises as told by your health care provider. Keep all follow-up visits as told by your health care provider. This is important. How is this prevented? Wear shoes that give your toes enough room and do not cause pain. Do not wear high-heeled shoes. Contact a  health care provider if: Your pain gets worse. Your toe becomes red or swollen. You develop an open sore on your toe. This information is not intended to replace advice given to you by your health care provider. Make sure you discuss any questions you have with your health care provider. Document Released: 02/05/2000 Document Revised: 09/05/2016 Document Reviewed: 06/03/2015 Elsevier Interactive  Patient Education  2019 [employer]. 1.

## 2018-05-01 ENCOUNTER — Ambulatory Visit (INDEPENDENT_AMBULATORY_CARE_PROVIDER_SITE_OTHER): Payer: Medicare Other | Admitting: Pharmacist Clinician (PhC)/ Clinical Pharmacy Specialist

## 2018-05-01 DIAGNOSIS — Z5181 Encounter for therapeutic drug level monitoring: Secondary | ICD-10-CM

## 2018-05-01 DIAGNOSIS — D6851 Activated protein C resistance: Secondary | ICD-10-CM

## 2018-05-01 DIAGNOSIS — Z7901 Long term (current) use of anticoagulants: Secondary | ICD-10-CM | POA: Diagnosis not present

## 2018-05-01 LAB — POCT INR: INR: 1.8 — AB (ref 2.0–3.0)

## 2018-05-03 ENCOUNTER — Other Ambulatory Visit: Payer: Self-pay

## 2018-05-03 ENCOUNTER — Other Ambulatory Visit: Payer: Self-pay | Admitting: Family Medicine

## 2018-05-03 ENCOUNTER — Ambulatory Visit (INDEPENDENT_AMBULATORY_CARE_PROVIDER_SITE_OTHER): Payer: Medicare Other | Admitting: Podiatry

## 2018-05-03 DIAGNOSIS — D6851 Activated protein C resistance: Secondary | ICD-10-CM | POA: Diagnosis not present

## 2018-05-03 DIAGNOSIS — L97511 Non-pressure chronic ulcer of other part of right foot limited to breakdown of skin: Secondary | ICD-10-CM

## 2018-05-03 MED ORDER — AMOXICILLIN 500 MG PO CAPS: 500.0000 mg | ORAL_CAPSULE | Freq: Three times a day (TID) | ORAL | 0 refills | Status: AC

## 2018-05-03 MED ORDER — MUPIROCIN 2 % EX OINT: TOPICAL_OINTMENT | CUTANEOUS | 0 refills | Status: AC

## 2018-05-04 ENCOUNTER — Other Ambulatory Visit: Payer: Medicare Other | Admitting: Orthotics

## 2018-05-06 ENCOUNTER — Encounter: Payer: Self-pay | Admitting: Podiatry

## 2018-05-06 NOTE — Progress Notes (Addendum)
Subjective:   [name] presents for continued care of partial thickness ulceration of 4th digit right foot.  Patient has been performing daily dressing changes to right 4th toe daily utilizing Polysporin Ointment. She relates she has had a friend help her perform dressing changes.  She continues to use the toe crest.   She also has gone to her Tai Chi class.  Pt. Relates toe is still sore, but not as sore as last visit.  Patient denies any fever, chills, nightsweats, nausea or vomiting.  Allergies  Allergen Reactions  . Codeine Nausea Only  . Erythromycin Nausea And Vomiting     Objective:   Ulceration located distal tip right 4th digit.  Predebridement measurements carried out today of 0.3 x 0.3 cm.   No redness, streaking or lymphangitis noted.  No probing to bone, no undermining, no active pus or purulence noted.  No deep abscess, no erythema, no edema, no cellulitis, no odor was encountered. Tender to palpation.  Postdebridement measurements today are 0.3 x 0.3 x 0.1 cm to level of dermis.  Pinpoint amount of serous drainage.  No probing to bone, no undermining, no active pus or purulence noted.  No deep abscess, no erythema, no edema, no cellulitis, no odor was encountered.    Assessment:   1.  Ulceration distal tip right 4th digit 2.    Factor V Leiden (coagulopathy)  Plan: 1. Ulcer was debrided and reactive hyperkeratoses and necrotic tissue was resected to the level of bleeding or viable tissue. Ulcer was cleansed with wound cleanser. Polysporin Ointment was applied to base of wound with light dressing. 2. Patient was given instructions on offloading with toe crest and dressing change with Polysporin and bandaid. She was instructed to call immediately if any signs or symptoms of infection or increased pain, redness or swelling arise.  3. Patient is to follow up one week. 4. Patient to call should there be a concern in the interim.

## 2018-05-07 ENCOUNTER — Telehealth: Payer: Self-pay | Admitting: Podiatry

## 2018-05-07 NOTE — Telephone Encounter (Signed)
Pt is scheduled for an appt on 3/19 for a weekly check on her ulcer. Pt is concerned about the Corona Virus and would like to know what she should do about the appt, should she come in or cancel. Pt requesting to speak with the nurse.

## 2018-05-08 NOTE — Telephone Encounter (Signed)
I told pt I would not be able to make the decision for her but I could tell her that we were cleaning and disinfecting rooms and equipment and she could ask for a mask at the door. I told pt she may risk the ulcer worsening if not managed on schedule. Pt states understanding and I transferred to pt to see if she could get an more convenient appt.

## 2018-05-09 ENCOUNTER — Ambulatory Visit (INDEPENDENT_AMBULATORY_CARE_PROVIDER_SITE_OTHER): Payer: Medicare Other | Admitting: Podiatry

## 2018-05-09 ENCOUNTER — Other Ambulatory Visit: Payer: Self-pay

## 2018-05-09 DIAGNOSIS — L84 Corns and callosities: Secondary | ICD-10-CM | POA: Diagnosis not present

## 2018-05-09 DIAGNOSIS — D6851 Activated protein C resistance: Secondary | ICD-10-CM

## 2018-05-10 ENCOUNTER — Ambulatory Visit: Payer: Medicare Other | Admitting: Podiatry

## 2018-05-13 ENCOUNTER — Encounter: Payer: Self-pay | Admitting: Podiatry

## 2018-05-13 NOTE — Progress Notes (Signed)
Subjective:   [name] presents for follow up of partial thickness ulceration of 4th digit right foot.  Patient has been performing daily dressing changes to right 4th toe daily utilizing Polysporin Ointment.  Patient states she continues to have pain when weightbearing.  She continues to use her toe crest.   Patient denies any fever, chills, nightsweats, nausea or vomiting.  Allergies  Allergen Reactions  . Codeine Nausea Only  . Erythromycin Nausea And Vomiting     Objective:   Ulceration located distal tip right 4th digit.  Predebridement measurements carried out today of 0.2 x 0.3 cm with subdermal hemorrhage.   No redness, streaking or lymphangitis noted.  No probing to bone, no undermining, no active pus or purulence noted.  No deep abscess, no erythema, no edema, no cellulitis, no odor was encountered. Tender to palpation.  Postdebridement measurements today are 0.2 x 0.3 x 0.1 cm to level of dermis.  Pinpoint amount of serous drainage.  No probing to bone, no undermining, no active pus or purulence noted.  No deep abscess, no erythema, no edema, no cellulitis, no odor was encountered.    Assessment:   1.  Partial thickness ulceration distal tip right 4th digit 2.    Factor V Leiden (coagulopathy)  Plan: 1. Ulcer was debrided and reactive hyperkeratoses and necrotic tissue was resected to the level of bleeding or viable tissue. Ulcer was cleansed with wound cleanser. Polysporin Ointment was applied to base of wound with light dressing. 2. Patient was given instructions on offloading with toe crest and dressing change with new prescription of Murpiriocin Ointment and bandaid. She was instructed to call immediately if any signs or symptoms of infection or increased pain, redness or swelling arise.  3. Start Amoxicillin 500 mg po tid x 10 days. 4. Patient is to follow up one week. 5. Patient to call should there be a concern in the interim.

## 2018-05-13 NOTE — Progress Notes (Signed)
Subjective:   [name] presents for follow up of partial thickness ulceration of 4th digit right foot.  Patient has been performing daily dressing changes to right 4th toe daily utilizing Mupirocin Ointment.   Patient states she continues to have pain when weightbearing. She does not feet the toe crest is helping her.  Patient denies any fever, chills, nightsweats, nausea or vomiting.  Allergies  Allergen Reactions  . Codeine Nausea Only  . Erythromycin Nausea And Vomiting     Objective:   Neurovascular status intact b/l.  Ulceration located distal tip right 4th digit.  Predebridement measurements carried out today of 0.3 x 0.3 cm with subdermal hemorrhage.   No redness, streaking or lymphangitis noted.  No probing to bone, no undermining, no active pus or purulence noted.  No deep abscess, no erythema, no edema, no cellulitis, no odor was encountered. Tender to palpation.  Postdebridement measurements today are 0.3 x 0.3 cm.  No drainage. No probing to bone, no undermining, no active pus or purulence noted.  No deep abscess, no erythema, no edema, no cellulitis, no odor was encountered.    Hammertoe deformity b/l 4th digit.  Stance evaluation relates compression of toe crest right 4th toe.  Assessment:   1.  Preulcerative corn distal tip right 4th digit 2.    Factor V Leiden (coagulopathy)  Plan: 1. Preulcerative corn  reactive hyperkeratoses was resected to the level of bleeding or viable tissue. Ulcer was cleansed with wound cleanser. Polysporin Ointment was applied to base of wound with light dressing. It has improved, but she still has problems when weightbearing. 2. Consult Pedorthist today for orthotic modification to offload 4th digit right foot. She is also interested in purchasing diabetic shoes. 3. Robin Harrell is to continue applying Murpiriocin Ointment and bandaid. Continue amoxicillin until all gone. 4. She was instructed to call immediately if any signs  or symptoms of infection or increased pain, redness or swelling arise.  5. Patient is to follow up 2 weeks. 6. Patient/POA to call should there be a concern in the interim.

## 2018-05-21 ENCOUNTER — Telehealth: Payer: Self-pay | Admitting: Pharmacist Clinician (PhC)/ Clinical Pharmacy Specialist

## 2018-05-21 NOTE — Telephone Encounter (Signed)
1. Do you currently have a fever? NO (yes = cancel and refer to pcp for e-visit) 2. Have you recently travelled on a cruise, internationally, or to [place], [place], [place], [place], [place], or [place], [place] [employer]) ?  3. NO (yes = cancel, stay home, monitor symptoms, and contact pcp or initiate e-visit if symptoms develop) 4. Have you been in contact with someone that is currently pending confirmation of Covid19 testing or has been confirmed to have the [place] virus?  NO (yes = cancel, stay home, away from tested individual, monitor symptoms, and contact pcp or initiate e-visit if symptoms develop) 5. Are you currently experiencing fatigue or cough? NO (yes = pt should be prepared to have a mask placed at the time of their visit).  Pt. Advised that we are restricting visitors at this time and anyone present in the vehicle should meet the above criteria as well. Advised that visit will be at curbside for finger stick ONLY and will receive call with instructions. Pt also advised to please bring own pen for signature of arrival document.

## 2018-05-22 ENCOUNTER — Ambulatory Visit (INDEPENDENT_AMBULATORY_CARE_PROVIDER_SITE_OTHER): Payer: Medicare Other | Admitting: Pharmacist

## 2018-05-22 DIAGNOSIS — Z5181 Encounter for therapeutic drug level monitoring: Secondary | ICD-10-CM

## 2018-05-22 DIAGNOSIS — D6851 Activated protein C resistance: Secondary | ICD-10-CM

## 2018-05-22 DIAGNOSIS — Z7901 Long term (current) use of anticoagulants: Secondary | ICD-10-CM

## 2018-05-22 LAB — POCT INR: INR: 2.9 (ref 2.0–3.0)

## 2018-05-23 ENCOUNTER — Ambulatory Visit: Payer: Medicare Other | Admitting: Podiatry

## 2018-05-23 ENCOUNTER — Telehealth: Payer: Self-pay | Admitting: Podiatry

## 2018-05-23 ENCOUNTER — Telehealth: Payer: Self-pay | Admitting: *Deleted

## 2018-05-23 NOTE — Telephone Encounter (Signed)
Patient called stating she had missed her appt this morning for an ulcer check and missed a call from Dr.Galaway this afternoon. Patient calling to return her call. Please give patient a call back.

## 2018-05-23 NOTE — Telephone Encounter (Signed)
Left message for pt to call with status of ulcer and modification of her shoe.

## 2018-05-23 NOTE — Telephone Encounter (Signed)
-----   Message from Marzetta Board, DPM sent at 05/23/2018  3:28 PM EDT ----- Regarding: Please call and check on patient Hi [name]!  Please tell Robin Harrell I was just calling to check on her. She missed her appt today. Just wanted an update from her----if her toe is feeling better with the modification Liliane Channel put in her shoe.  Thanks!

## 2018-05-28 ENCOUNTER — Other Ambulatory Visit: Payer: Medicare Other | Admitting: Orthotics

## 2018-05-29 ENCOUNTER — Encounter: Payer: Self-pay | Admitting: Podiatry

## 2018-05-29 ENCOUNTER — Other Ambulatory Visit: Payer: Medicare Other | Admitting: Orthotics

## 2018-05-29 ENCOUNTER — Other Ambulatory Visit: Payer: Self-pay

## 2018-05-29 ENCOUNTER — Ambulatory Visit (INDEPENDENT_AMBULATORY_CARE_PROVIDER_SITE_OTHER): Payer: Medicare Other | Admitting: Podiatry

## 2018-05-29 VITALS — Temp 97.5°F

## 2018-05-29 DIAGNOSIS — L97511 Non-pressure chronic ulcer of other part of right foot limited to breakdown of skin: Secondary | ICD-10-CM | POA: Diagnosis not present

## 2018-05-29 DIAGNOSIS — M2031 Hallux varus (acquired), right foot: Secondary | ICD-10-CM

## 2018-05-29 NOTE — Progress Notes (Addendum)
Subjective:   Robin.  Robin Harrell presents for follow for chronic ulceration of 4th digit right foot. She states she has been applying Mupirocin Ointment  to right 4th toe daily.  Patient states her pain hasn't gotten any better.  She states the "ramp" modification applied to her orthotic by the Pedorthist made her toe feel worse.  She is also here to pick up shoes from Pedorthist on today.  Patient denies any fever, chills, nightsweats, nausea or vomiting.  Allergies  Allergen Reactions  . Codeine Nausea Only  . Erythromycin Nausea And Vomiting     Objective:   Neurovascular status intact b/l.  Ulceration located distal tip right 4th digit.  Predebridement measurements carried out today of 0.5 x 0.5 cm with subdermal hemorrhage.   No redness, streaking or lymphangitis noted.  No probing to bone, no undermining, no active pus or purulence noted.  No deep abscess, no erythema, no edema, no cellulitis, no odor was encountered. Tender to palpation.  Postdebridement to level of partial thickness ulceration and measurements are 0.3 x 0.3 cm.  No drainage. No probing to bone, no undermining, no active pus or purulence noted.  No deep abscess, no erythema, no edema, no cellulitis, no odor was encountered.    New formation of lesion distal tip right 3rd digit.  Hammertoe deformity b/l 4th digit.  Assessment:   1.  Partial thickness ulcer distal tip right 4th digit 2.    Factor V Leiden (coagulopathy)  Plan: 1. Partial thickness ulcer was debrided.  Ulcer was cleansed with wound cleanser. TAO was applied to digit with light dressing. It has waxed and waned with conservative treatment. We have tried padding, orthotic modifications and shoe gear change with no success. I referred her to Dr. Jacqualyn Posey for consideration of flexor tenotomy to assist in relieving pressure on distal aspect of the right 4th toe. 2. Courtesy debridement of right 3rd digit with iatrogenic laceration treated with  Lumicain Hemostatic Solution. Cleansed with alcohol. Triple antibiotic ointment and bandaid applied. She is to apply Mupirocin Ointment to digit once daily. 3. She also saw Pedorthist today for second insert orthotic modification to offload 4th digit right foot and to pick up shoes she has ordered.  4. Robin Harrell is to continue applying Murpiriocin Ointment and bandaid. 5. She was instructed to call immediately if any signs or symptoms of infection or increased pain, redness or swelling arise.  6. Patient is to follow up 2 weeks with Dr. Jacqualyn Posey. 7. Patient/POA to call should there be a concern in the interim.  ----- Addedum:   Saw the patient after Dr. Elisha Ponder-   Due to the chronic callus on the right 4th toe we discussed a flexor tenotomy. Understanding risks and complications she wants to proceed. Surgical consent signed. Toe was anesthestized with 3cc of lidocaine and marcaine plain. Toe was prepped with betadine. An 18 gauge needle to inserted along the plantar aspect of the IPJ and this was utilized to cut the flexor tendon. The toe was then sitting in a rectus position. Betadine was applied followed by a light compression wrap. There was an immediate CRT after the procedure. She tolerated well. Post procedure instructions discussed. Monitor for any signs or symptoms of infection.    Celesta Gentile, DPM

## 2018-06-05 ENCOUNTER — Telehealth: Payer: Self-pay | Admitting: Podiatry

## 2018-06-05 NOTE — Telephone Encounter (Signed)
Dr. Jacqualyn Posey did a procedure last week, she has continued to wrap it since then. She was wondering how long does she have to do this process.

## 2018-06-05 NOTE — Telephone Encounter (Signed)
I called pt and instructed to continue the dressing change daily until seen in office by Dr. Jacqualyn Posey.

## 2018-06-12 ENCOUNTER — Ambulatory Visit (INDEPENDENT_AMBULATORY_CARE_PROVIDER_SITE_OTHER): Payer: Medicare Other | Admitting: Podiatry

## 2018-06-12 ENCOUNTER — Ambulatory Visit: Payer: Medicare Other | Admitting: Orthotics

## 2018-06-12 ENCOUNTER — Encounter: Payer: Self-pay | Admitting: Podiatry

## 2018-06-12 ENCOUNTER — Other Ambulatory Visit: Payer: Self-pay

## 2018-06-12 VITALS — Temp 97.5°F

## 2018-06-12 DIAGNOSIS — M2041 Other hammer toe(s) (acquired), right foot: Secondary | ICD-10-CM

## 2018-06-12 DIAGNOSIS — M2042 Other hammer toe(s) (acquired), left foot: Secondary | ICD-10-CM

## 2018-06-12 DIAGNOSIS — L97511 Non-pressure chronic ulcer of other part of right foot limited to breakdown of skin: Secondary | ICD-10-CM

## 2018-06-12 DIAGNOSIS — M2031 Hallux varus (acquired), right foot: Secondary | ICD-10-CM

## 2018-06-12 NOTE — Progress Notes (Signed)
Patient recv today pair of apex [id number] shoes; she seemed pleased.  Cash pay

## 2018-06-12 NOTE — Progress Notes (Signed)
Subjective: 85 year old female presents the office today for evaluation after undergoing flexor tenotomy of the right fourth toe.  She states he has been doing well and the pain she is having is pretty much resolved.  Her main concern she is used to be pain at the tip of the third toe.  She denies any redness or drainage or any signs of infection.  Denies any systemic complaints such as fevers, chills, nausea, vomiting. No acute changes since last appointment, and no other complaints at this time.   Objective: AAO x3, NAD DP/PT pulses palpable bilaterally, CRT less than 3 seconds Mild hyperkeratotic tissue present to the distal aspect of the fourth toe but there is no pain.  Upon debridement of ulceration representing infection.  There is mild callus formation present to the distal aspect of the third toe.  No ongoing ulceration drainage or any signs of infection. No edema, erythema, increase in warmth to bilateral lower extremities.  No open lesions or pre-ulcerative lesions.  No pain with calf compression, swelling, warmth, erythema  Assessment: Hammertoe to the right 3rd toe with symptomatic hyperkeratotic lesion.  Healed procedure site right fourth toe with significantly improved pain  Plan: -All treatment options discussed with the patient including all alternatives, risks, complications.  -Overall reports he is doing well and her symptoms are greatly improved.  Main concern now the third toe.  I debrided the hyperkeratotic tissue and complications or bleeding.  No signs of infection.  We discussed flexor tenotomy but she wants to hold off on this toe.  We will have her do this next 4 weeks at her convenience if she wishes to do so.  For now continue offloading. Liliane Channel saw her today for diabetic shoes -Patient encouraged to call the office with any questions, concerns, change in symptoms.   Trula Slade DPM

## 2018-06-18 ENCOUNTER — Telehealth: Payer: Self-pay

## 2018-06-18 NOTE — Telephone Encounter (Signed)

## 2018-06-19 ENCOUNTER — Ambulatory Visit (INDEPENDENT_AMBULATORY_CARE_PROVIDER_SITE_OTHER): Payer: Medicare Other | Admitting: Pharmacist

## 2018-06-19 ENCOUNTER — Ambulatory Visit: Payer: Medicare Other | Admitting: Orthotics

## 2018-06-19 ENCOUNTER — Other Ambulatory Visit: Payer: Self-pay

## 2018-06-19 DIAGNOSIS — D6851 Activated protein C resistance: Secondary | ICD-10-CM

## 2018-06-19 DIAGNOSIS — Z5181 Encounter for therapeutic drug level monitoring: Secondary | ICD-10-CM | POA: Diagnosis not present

## 2018-06-19 DIAGNOSIS — Z7901 Long term (current) use of anticoagulants: Secondary | ICD-10-CM

## 2018-06-19 DIAGNOSIS — M2031 Hallux varus (acquired), right foot: Secondary | ICD-10-CM

## 2018-06-19 DIAGNOSIS — M2042 Other hammer toe(s) (acquired), left foot: Principal | ICD-10-CM

## 2018-06-19 DIAGNOSIS — M2041 Other hammer toe(s) (acquired), right foot: Secondary | ICD-10-CM

## 2018-06-19 DIAGNOSIS — L97511 Non-pressure chronic ulcer of other part of right foot limited to breakdown of skin: Secondary | ICD-10-CM

## 2018-06-19 LAB — POCT INR: INR: 3 (ref 2.0–3.0)

## 2018-06-19 NOTE — Progress Notes (Signed)
Switched inserts from other shoes to the new ones and she is going to try this "experiment" at home to see if the issue  Is the orthothic or the shoes.

## 2018-07-13 ENCOUNTER — Telehealth: Payer: Self-pay

## 2018-07-13 NOTE — Telephone Encounter (Signed)

## 2018-07-17 ENCOUNTER — Ambulatory Visit (INDEPENDENT_AMBULATORY_CARE_PROVIDER_SITE_OTHER): Payer: Medicare Other | Admitting: *Deleted

## 2018-07-17 ENCOUNTER — Other Ambulatory Visit: Payer: Self-pay

## 2018-07-17 DIAGNOSIS — D6851 Activated protein C resistance: Secondary | ICD-10-CM

## 2018-07-17 DIAGNOSIS — Z7901 Long term (current) use of anticoagulants: Secondary | ICD-10-CM | POA: Diagnosis not present

## 2018-07-17 DIAGNOSIS — Z5181 Encounter for therapeutic drug level monitoring: Secondary | ICD-10-CM

## 2018-07-17 LAB — POCT INR: INR: 3 (ref 2.0–3.0)

## 2018-07-17 NOTE — Patient Instructions (Signed)
Description   Spoke with pt and instructed pt to continue 1 tablet daily except for 1/2 tablet each Monday, Wednesday and Friday.  Repeat INR in 6 weeks.

## 2018-07-18 ENCOUNTER — Telehealth: Payer: Self-pay | Admitting: *Deleted

## 2018-07-18 NOTE — Telephone Encounter (Signed)
I spoke with Mrs. Robin Harrell, Robin Harrell stated she didn't need to come every year,he will keep me as his patient. She's prn.

## 2018-07-27 DIAGNOSIS — H401132 Primary open-angle glaucoma, bilateral, moderate stage: Secondary | ICD-10-CM | POA: Diagnosis not present

## 2018-07-31 ENCOUNTER — Encounter: Payer: Medicare Other | Admitting: Family Medicine

## 2018-08-01 NOTE — Progress Notes (Signed)
This encounter was created in error - please disregard.

## 2018-08-02 ENCOUNTER — Encounter: Payer: Self-pay | Admitting: Family Medicine

## 2018-08-02 ENCOUNTER — Other Ambulatory Visit: Payer: Self-pay

## 2018-08-02 ENCOUNTER — Ambulatory Visit (INDEPENDENT_AMBULATORY_CARE_PROVIDER_SITE_OTHER): Payer: Medicare Other | Admitting: Family Medicine

## 2018-08-02 VITALS — Wt 130.0 lb

## 2018-08-02 DIAGNOSIS — Z7901 Long term (current) use of anticoagulants: Secondary | ICD-10-CM

## 2018-08-02 DIAGNOSIS — I10 Essential (primary) hypertension: Secondary | ICD-10-CM | POA: Diagnosis not present

## 2018-08-02 DIAGNOSIS — E039 Hypothyroidism, unspecified: Secondary | ICD-10-CM

## 2018-08-02 DIAGNOSIS — M810 Age-related osteoporosis without current pathological fracture: Secondary | ICD-10-CM

## 2018-08-02 DIAGNOSIS — I350 Nonrheumatic aortic (valve) stenosis: Secondary | ICD-10-CM | POA: Diagnosis not present

## 2018-08-02 DIAGNOSIS — G250 Essential tremor: Secondary | ICD-10-CM | POA: Diagnosis not present

## 2018-08-02 NOTE — Progress Notes (Signed)
TELEPHONE ENCOUNTER   Patient verbally agreed to telephone visit and is aware that copayment and coinsurance may apply. Patient was treated using telemedicine according to accepted telemedicine protocols.  Location of the patient: home Location of provider: [place], [place] office Names of all persons participating in the telemedicine service and role in the encounter: [name], MD Lilli Light, CMA   Subjective  CC:  Chief Complaint  Patient presents with  . Hypertension    Has not checked her BP, states that she has broken her home BP machine    HPI: Robin Harrell is a 85 y.o. female who was telephoned today to address the problems listed above in the chief complaint.  Telephone call due to pandemic restrictions for this high risk patient.   Doing well. Last cpe 01/2018 with well controlled chronic medical problems listed below. Fortunately she continues to do well. Feels well w/o sxs of CAD, Arrhythmia or  Worsening chf. Compliant with meds. No doe or sob. No lightheadedness. Energy level is good. Follows with cards and coumadin clinic.   Last prolia injection was jan. Due in July for next injection. Next dexa due in 2021.   Sees podiatry for foot deformities and pain. She says this is her largest problem.   Lives independently.   ASSESSMENT: 1. Essential hypertension   2. Hypothyroidism (acquired)   3. Long term (current) use of anticoagulants   4. Aortic stenosis, mild   5. Essential tremor   6. Age-related osteoporosis without current pathological fracture      HTN, low thyroid and HLD :  All have been well controlled. No change in meds at this time.  AS, mild, asymptomatic  Diastolic dysfunction, compensated.   To schedule next prolia injectoin  Time spent with the patient (non face-to-face time during this virtual encounter): 25 minutes, spent in obtaining information about her symptoms, reviewing her previous labs, evaluations,  and treatments, counseling her about her condition (please see the discussed topics above), and developing a plan to further investigate it; the patient was provided an opportunity to ask questions and all were answered. The patient agreed with the plan and demonstrated an understanding of the instructions.   The patient was advised to call back or seek an in-person evaluation if the symptoms worsen or if the condition fails to improve as anticipated.  Follow up: Return in about 6 months (around 02/01/2019) for complete physical, follow up Hypertension, follow up hypercholesterolemia.  Visit date not found  No orders of the defined types were placed in this encounter.  No orders of the defined types were placed in this encounter.    I reviewed the patients updated PMH, FH, and SocHx.    Patient Active Problem List   Diagnosis Date Noted  . Hypothyroidism (acquired) 09/02/2014    Priority: High  . Hyperlipidemia 11/19/2012    Priority: High  . Long term (current) use of anticoagulants 05/10/2012    Priority: High  . Diastolic dysfunction 30/08/6224    Priority: High  . Factor V deficiency ([place]) 02/09/2010    Priority: High  . Osteoporosis 02/09/2010    Priority: High  . Essential hypertension 11/15/2007    Priority: High  . Aortic stenosis, mild 02/07/2017    Priority: Medium  . Chronic idiopathic thrombocytopenia ([facility]) 04/14/2016    Priority: Medium  . Essential tremor 09/03/2015    Priority: Medium  . Edema of right lower extremity 02/01/2013    Priority: Medium  . Colon  polyp 10/15/2012    Priority: Medium  . Insomnia 10/11/2011    Priority: Medium  . Osteoarthritis, multiple sites 06/30/2011    Priority: Medium  . DJD (degenerative joint disease), lumbar 10/14/2009    Priority: Medium  . Glaucoma 01/29/2008    Priority: Medium  . History of DVT (deep vein thrombosis) 11/15/2007    Priority: Medium  . GERD 11/15/2007    Priority: Medium  . COUGH, CHRONIC  11/15/2007    Priority: Medium  . History of recurrent UTIs 01/26/2017    Priority: Low  . History of respiratory failure 11/11/2014    Priority: Low  . Seasonal and perennial allergic rhinitis 11/15/2007    Priority: Low   Current Meds  Medication Sig  . atorvastatin (LIPITOR) 10 MG tablet Take 1 tablet (10 mg total) by mouth daily at 6 PM.  . brimonidine (ALPHAGAN) 0.15 % ophthalmic solution Place 1 drop into both eyes 2 (two) times daily.  . cetirizine (ZYRTEC) 10 MG tablet TAKE 1 TABLET BY MOUTH EVERY DAY  . chlorpheniramine (CHLOR-TRIMETON) 4 MG tablet Take by mouth.  Marland Kitchen ipratropium (ATROVENT) 0.03 % nasal spray Place 2 sprays into both nostrils 3 (three) times daily as needed for rhinitis.  Marland Kitchen latanoprost (XALATAN) 0.005 % ophthalmic solution Place 1 drop into both eyes at bedtime.   Marland Kitchen levothyroxine (SYNTHROID, LEVOTHROID) 25 MCG tablet TAKE 1 TABLET BY MOUTH EVERY DAY  . losartan (COZAAR) 50 MG tablet TAKE 1 TABLET BY MOUTH EVERY DAY  . metoprolol tartrate (LOPRESSOR) 25 MG tablet TAKE 1 TABLET (25 MG TOTAL) BY MOUTH 2 (TWO) TIMES DAILY. KEEP OV.  . multivitamin-iron-minerals-folic acid (CENTRUM) chewable tablet Chew 1 tablet by mouth daily.  . vitamin E (VITAMIN E) 1000 UNIT capsule Take 1,000 Units by mouth daily.  Marland Kitchen warfarin (COUMADIN) 7.5 MG tablet Take 1/2 to 1 tablet by mouth daily as directed    Allergies: Patient is allergic to codeine and erythromycin. Family History: Patient family history includes Allergic rhinitis in her sister; Breast cancer (age of onset: 47) in her daughter; Cirrhosis in her father; Coronary artery disease in her maternal grandfather and paternal grandfather; Heart disease in her paternal grandmother; Lung cancer in her father; Skin cancer in her brother; Throat cancer in her maternal grandfather and paternal grandfather. Social History:  Patient  reports that she quit smoking about 60 years ago. Her smoking use included cigarettes. She quit after  1.00 year of use. She has never used smokeless tobacco. She reports current alcohol use. She reports that she does not use drugs.  Review of Systems: Constitutional: Negative for fever malaise or anorexia Cardiovascular: negative for chest pain Respiratory: negative for SOB or persistent cough Gastrointestinal: negative for abdominal pain

## 2018-08-10 ENCOUNTER — Emergency Department (HOSPITAL_COMMUNITY): Payer: Medicare Other

## 2018-08-10 ENCOUNTER — Inpatient Hospital Stay (HOSPITAL_COMMUNITY)
Admission: EM | Admit: 2018-08-10 | Discharge: 2018-08-18 | DRG: 184 | Disposition: A | Discharge status: Home Health | Payer: Medicare Other | Attending: Internal Medicine | Admitting: Internal Medicine

## 2018-08-10 ENCOUNTER — Other Ambulatory Visit: Payer: Self-pay

## 2018-08-10 ENCOUNTER — Encounter (HOSPITAL_COMMUNITY): Payer: Self-pay

## 2018-08-10 DIAGNOSIS — Z87440 Personal history of urinary (tract) infections: Secondary | ICD-10-CM | POA: Diagnosis present

## 2018-08-10 DIAGNOSIS — Z87891 Personal history of nicotine dependence: Secondary | ICD-10-CM

## 2018-08-10 DIAGNOSIS — D6851 Activated protein C resistance: Secondary | ICD-10-CM | POA: Diagnosis present

## 2018-08-10 DIAGNOSIS — Y9222 Religious institution as the place of occurrence of the external cause: Secondary | ICD-10-CM

## 2018-08-10 DIAGNOSIS — R079 Chest pain, unspecified: Secondary | ICD-10-CM | POA: Diagnosis not present

## 2018-08-10 DIAGNOSIS — E039 Hypothyroidism, unspecified: Secondary | ICD-10-CM | POA: Diagnosis present

## 2018-08-10 DIAGNOSIS — D693 Immune thrombocytopenic purpura: Secondary | ICD-10-CM | POA: Diagnosis present

## 2018-08-10 DIAGNOSIS — I5032 Chronic diastolic (congestive) heart failure: Secondary | ICD-10-CM | POA: Diagnosis not present

## 2018-08-10 DIAGNOSIS — D62 Acute posthemorrhagic anemia: Secondary | ICD-10-CM | POA: Diagnosis not present

## 2018-08-10 DIAGNOSIS — Z86718 Personal history of other venous thrombosis and embolism: Secondary | ICD-10-CM

## 2018-08-10 DIAGNOSIS — R310 Gross hematuria: Secondary | ICD-10-CM | POA: Diagnosis present

## 2018-08-10 DIAGNOSIS — S2241XA Multiple fractures of ribs, right side, initial encounter for closed fracture: Principal | ICD-10-CM | POA: Diagnosis present

## 2018-08-10 DIAGNOSIS — I5189 Other ill-defined heart diseases: Secondary | ICD-10-CM

## 2018-08-10 DIAGNOSIS — S301XXA Contusion of abdominal wall, initial encounter: Secondary | ICD-10-CM | POA: Diagnosis not present

## 2018-08-10 DIAGNOSIS — S32018A Other fracture of first lumbar vertebra, initial encounter for closed fracture: Secondary | ICD-10-CM | POA: Diagnosis present

## 2018-08-10 DIAGNOSIS — W19XXXA Unspecified fall, initial encounter: Secondary | ICD-10-CM | POA: Diagnosis not present

## 2018-08-10 DIAGNOSIS — Z7901 Long term (current) use of anticoagulants: Secondary | ICD-10-CM

## 2018-08-10 DIAGNOSIS — R52 Pain, unspecified: Secondary | ICD-10-CM | POA: Diagnosis not present

## 2018-08-10 DIAGNOSIS — S199XXA Unspecified injury of neck, initial encounter: Secondary | ICD-10-CM | POA: Diagnosis not present

## 2018-08-10 DIAGNOSIS — M810 Age-related osteoporosis without current pathological fracture: Secondary | ICD-10-CM | POA: Diagnosis present

## 2018-08-10 DIAGNOSIS — D682 Hereditary deficiency of other clotting factors: Secondary | ICD-10-CM | POA: Diagnosis not present

## 2018-08-10 DIAGNOSIS — Z79899 Other long term (current) drug therapy: Secondary | ICD-10-CM

## 2018-08-10 DIAGNOSIS — I10 Essential (primary) hypertension: Secondary | ICD-10-CM | POA: Diagnosis present

## 2018-08-10 DIAGNOSIS — Z20828 Contact with and (suspected) exposure to other viral communicable diseases: Secondary | ICD-10-CM | POA: Diagnosis present

## 2018-08-10 DIAGNOSIS — K219 Gastro-esophageal reflux disease without esophagitis: Secondary | ICD-10-CM | POA: Diagnosis present

## 2018-08-10 DIAGNOSIS — S3993XA Unspecified injury of pelvis, initial encounter: Secondary | ICD-10-CM | POA: Diagnosis not present

## 2018-08-10 DIAGNOSIS — S2249XA Multiple fractures of ribs, unspecified side, initial encounter for closed fracture: Secondary | ICD-10-CM | POA: Diagnosis present

## 2018-08-10 DIAGNOSIS — W01119A Fall on same level from slipping, tripping and stumbling with subsequent striking against unspecified sharp object, initial encounter: Secondary | ICD-10-CM | POA: Diagnosis present

## 2018-08-10 DIAGNOSIS — Z885 Allergy status to narcotic agent status: Secondary | ICD-10-CM

## 2018-08-10 DIAGNOSIS — M159 Polyosteoarthritis, unspecified: Secondary | ICD-10-CM | POA: Diagnosis present

## 2018-08-10 DIAGNOSIS — N390 Urinary tract infection, site not specified: Secondary | ICD-10-CM | POA: Diagnosis not present

## 2018-08-10 DIAGNOSIS — Z8709 Personal history of other diseases of the respiratory system: Secondary | ICD-10-CM

## 2018-08-10 DIAGNOSIS — S37011A Minor contusion of right kidney, initial encounter: Secondary | ICD-10-CM | POA: Diagnosis present

## 2018-08-10 DIAGNOSIS — R319 Hematuria, unspecified: Secondary | ICD-10-CM

## 2018-08-10 DIAGNOSIS — S3013XA Contusion of flank (latus) region, initial encounter: Secondary | ICD-10-CM | POA: Diagnosis present

## 2018-08-10 DIAGNOSIS — E782 Mixed hyperlipidemia: Secondary | ICD-10-CM | POA: Diagnosis present

## 2018-08-10 DIAGNOSIS — K5900 Constipation, unspecified: Secondary | ICD-10-CM | POA: Diagnosis not present

## 2018-08-10 DIAGNOSIS — W01190A Fall on same level from slipping, tripping and stumbling with subsequent striking against furniture, initial encounter: Secondary | ICD-10-CM | POA: Diagnosis present

## 2018-08-10 DIAGNOSIS — I110 Hypertensive heart disease with heart failure: Secondary | ICD-10-CM | POA: Diagnosis present

## 2018-08-10 DIAGNOSIS — G4700 Insomnia, unspecified: Secondary | ICD-10-CM | POA: Diagnosis present

## 2018-08-10 DIAGNOSIS — S3991XA Unspecified injury of abdomen, initial encounter: Secondary | ICD-10-CM | POA: Diagnosis not present

## 2018-08-10 DIAGNOSIS — R109 Unspecified abdominal pain: Secondary | ICD-10-CM | POA: Diagnosis not present

## 2018-08-10 DIAGNOSIS — J939 Pneumothorax, unspecified: Secondary | ICD-10-CM

## 2018-08-10 DIAGNOSIS — S0990XA Unspecified injury of head, initial encounter: Secondary | ICD-10-CM | POA: Diagnosis not present

## 2018-08-10 DIAGNOSIS — R0789 Other chest pain: Secondary | ICD-10-CM | POA: Diagnosis not present

## 2018-08-10 DIAGNOSIS — M47816 Spondylosis without myelopathy or radiculopathy, lumbar region: Secondary | ICD-10-CM | POA: Diagnosis present

## 2018-08-10 DIAGNOSIS — J302 Other seasonal allergic rhinitis: Secondary | ICD-10-CM | POA: Diagnosis present

## 2018-08-10 DIAGNOSIS — H409 Unspecified glaucoma: Secondary | ICD-10-CM | POA: Diagnosis present

## 2018-08-10 DIAGNOSIS — I350 Nonrheumatic aortic (valve) stenosis: Secondary | ICD-10-CM

## 2018-08-10 DIAGNOSIS — Z79890 Hormone replacement therapy (postmenopausal): Secondary | ICD-10-CM

## 2018-08-10 DIAGNOSIS — E785 Hyperlipidemia, unspecified: Secondary | ICD-10-CM | POA: Diagnosis present

## 2018-08-10 DIAGNOSIS — Z881 Allergy status to other antibiotic agents status: Secondary | ICD-10-CM

## 2018-08-10 DIAGNOSIS — Z86711 Personal history of pulmonary embolism: Secondary | ICD-10-CM

## 2018-08-10 LAB — COMPREHENSIVE METABOLIC PANEL
ALT: 21 U/L (ref 0–44)
AST: 32 U/L (ref 15–41)
Albumin: 4.3 g/dL (ref 3.5–5.0)
Alkaline Phosphatase: 62 U/L (ref 38–126)
Anion gap: 11 (ref 5–15)
BUN: 21 mg/dL (ref 8–23)
CO2: 25 mmol/L (ref 22–32)
Calcium: 9.9 mg/dL (ref 8.9–10.3)
Chloride: 103 mmol/L (ref 98–111)
Creatinine, Ser: 0.92 mg/dL (ref 0.44–1.00)
GFR calc Af Amer: >60 mL/min (ref >60)
GFR calc non Af Amer: 58 mL/min — ABNORMAL LOW (ref >60)
Glucose, Bld: 116 mg/dL — ABNORMAL HIGH (ref 70–99)
Potassium: 4.3 mmol/L (ref 3.5–5.1)
Sodium: 139 mmol/L (ref 135–145)
Total Bilirubin: 0.7 mg/dL (ref 0.3–1.2)
Total Protein: 7.5 g/dL (ref 6.5–8.1)

## 2018-08-10 LAB — CBC WITH DIFFERENTIAL/PLATELET
Abs Immature Granulocytes: 0.02 10*3/uL (ref 0.00–0.07)
Basophils Absolute: 0 10*3/uL (ref 0.0–0.1)
Basophils Relative: 1 %
Eosinophils Absolute: 0.1 10*3/uL (ref 0.0–0.5)
Eosinophils Relative: 1 %
HCT: 39.3 % (ref 36.0–46.0)
Hemoglobin: 12.6 g/dL (ref 12.0–15.0)
Immature Granulocytes: 0 %
Lymphocytes Relative: 21 %
Lymphs Abs: 1.1 10*3/uL (ref 0.7–4.0)
MCH: 31 pg (ref 26.0–34.0)
MCHC: 32.1 g/dL (ref 30.0–36.0)
MCV: 96.6 fL (ref 80.0–100.0)
Monocytes Absolute: 0.5 10*3/uL (ref 0.1–1.0)
Monocytes Relative: 10 %
Neutro Abs: 3.4 10*3/uL (ref 1.7–7.7)
Neutrophils Relative %: 67 %
Platelets: 119 10*3/uL — ABNORMAL LOW (ref 150–400)
RBC: 4.07 MIL/uL (ref 3.87–5.11)
RDW: 13.2 % (ref 11.5–15.5)
WBC: 5.1 10*3/uL (ref 4.0–10.5)
nRBC: 0 % (ref 0.0–0.2)

## 2018-08-10 LAB — URINALYSIS, ROUTINE W REFLEX MICROSCOPIC
Bacteria, UA: NONE SEEN
Bilirubin Urine: NEGATIVE
Glucose, UA: NEGATIVE mg/dL
Ketones, ur: NEGATIVE mg/dL
Leukocytes,Ua: NEGATIVE
Nitrite: NEGATIVE
Protein, ur: 100 mg/dL — AB
RBC / HPF: >50 RBC/hpf — ABNORMAL HIGH (ref 0–5)
Specific Gravity, Urine: 1.012 (ref 1.005–1.030)
pH: 6 (ref 5.0–8.0)

## 2018-08-10 LAB — TYPE AND SCREEN
ABO/RH(D): B POS
Antibody Screen: NEGATIVE

## 2018-08-10 LAB — APTT: aPTT: 47 seconds — ABNORMAL HIGH (ref 24–36)

## 2018-08-10 LAB — PROTIME-INR
INR: 3 — ABNORMAL HIGH (ref 0.8–1.2)
Prothrombin Time: 30.8 seconds — ABNORMAL HIGH (ref 11.4–15.2)

## 2018-08-10 LAB — LIPASE, BLOOD: Lipase: 33 U/L (ref 11–51)

## 2018-08-10 MED ORDER — SODIUM CHLORIDE 0.9 % IV BOLUS
1000.0000 mL | Freq: Once | INTRAVENOUS | Status: AC
  Administered 2018-08-11: 1000 mL via INTRAVENOUS

## 2018-08-10 MED ORDER — IOHEXOL 300 MG/ML  SOLN
100.0000 mL | Freq: Once | INTRAMUSCULAR | Status: AC | PRN
  Administered 2018-08-10: 100 mL via INTRAVENOUS

## 2018-08-10 MED ORDER — TRAMADOL HCL 50 MG PO TABS
50.0000 mg | ORAL_TABLET | Freq: Four times a day (QID) | ORAL | Status: DC | PRN
  Administered 2018-08-10 – 2018-08-13 (×2): 50 mg via ORAL
  Administered 2018-08-15: 100 mg via ORAL
  Administered 2018-08-16 (×2): 50 mg via ORAL
  Administered 2018-08-18: 06:00:00 100 mg via ORAL
  Filled 2018-08-10: qty 2
  Filled 2018-08-10 (×2): qty 1
  Filled 2018-08-10: qty 2
  Filled 2018-08-10 (×2): qty 1

## 2018-08-10 MED ORDER — ACETAMINOPHEN 500 MG PO TABS
1000.0000 mg | ORAL_TABLET | Freq: Three times a day (TID) | ORAL | Status: DC
  Administered 2018-08-11 – 2018-08-17 (×20): 1000 mg via ORAL
  Filled 2018-08-10 (×22): qty 2

## 2018-08-10 MED ORDER — FENTANYL CITRATE (PF) 100 MCG/2ML IJ SOLN: 25.0000 ug | Freq: Once | INTRAMUSCULAR | Status: DC

## 2018-08-10 MED ORDER — SODIUM CHLORIDE (PF) 0.9 % IJ SOLN
INTRAMUSCULAR | Status: AC
  Filled 2018-08-10: qty 50

## 2018-08-10 MED ORDER — GABAPENTIN 300 MG PO CAPS
300.0000 mg | ORAL_CAPSULE | Freq: Every day | ORAL | Status: AC
  Administered 2018-08-11 – 2018-08-12 (×3): 300 mg via ORAL
  Filled 2018-08-10 (×3): qty 1

## 2018-08-10 MED ORDER — FENTANYL CITRATE (PF) 100 MCG/2ML IJ SOLN: 25.0000 ug | INTRAMUSCULAR | Status: DC | PRN

## 2018-08-10 MED ORDER — ONDANSETRON HCL 4 MG/2ML IJ SOLN
4.0000 mg | Freq: Once | INTRAMUSCULAR | Status: AC
  Administered 2018-08-10: 4 mg via INTRAVENOUS
  Filled 2018-08-10: qty 2

## 2018-08-10 MED ORDER — FENTANYL CITRATE (PF) 100 MCG/2ML IJ SOLN
25.0000 ug | Freq: Once | INTRAMUSCULAR | Status: AC
  Administered 2018-08-10: 25 ug via INTRAVENOUS
  Filled 2018-08-10: qty 2

## 2018-08-10 NOTE — ED Notes (Signed)
Bed: NA35 Expected date:  Expected time:  Means of arrival:  Comments: EMS-fall/right side pain

## 2018-08-10 NOTE — Consult Note (Signed)
Robin Harrell [facility]  Nov 04, 1933 [id number]  CARE TEAM:  PCP: [name], MD  Outpatient Care Team: Patient Care Team: [name], MD as PCP - General (Family Medicine) Trula Slade, DPM as Consulting Physician (Podiatry) Lamonte Sakai, Rose Fillers, MD as Consulting Physician (Pulmonary Disease) Lorretta Harp, MD as Consulting Physician (Cardiology) Harriett Sine, MD as Consulting Physician (Dermatology)  Inpatient Treatment Team: Treatment Team: Attending Provider: Hayden Rasmussen, MD; Registered Nurse: Kathlen Mody, RN; Technician: Cephas Darby, NT; Physician Assistant: Janet Berlin; Consulting Physician: Md, Trauma, MD   This patient is a 85 y.o.female who presents today for surgical evaluation at the request of Benedetto Goad, [place].   Chief complaint / Reason for evaluation: Fall on side with bruising and rib fractures.  Chronically anticoagulated  Elderly woman with many medical issues.  History of DVT and factor V Leiden deficiency on chronic anticoagulation.  Hypertension.  Respiratory failure.  She tripped while she was at church.  Person tried to help catch her but she ended up falling up against a church pew and had sharp pain on her rib and side.  She denies any lightheadedness or dizziness.  No aura.  No loss of consciousness.  She is chronically anticoagulated on warfarin.  Had worsening bruising and pain.  Worsening aching.  Hematuria.  Brought to the emergency room this evening.  Work-up reveals rib fractures.  No evidence of any solid organ injury or intra-abdominal perforation.  No renal injury or bladder injury.  However given the rib fractures with advanced age and discomfort in her being chronically anticoagulated, trauma consultation requested.  Patient not tachycardic nor hypoxic.  Sat'ing normally on room air.  While of advanced age, she is relatively active.  He walks with a cane denies any prior episodes of falls.  She usually drives  and is somewhat independent.    Assessment  Robin Harrell  85 y.o. female       Problem List:  Principal Problem:   Fall against sharp object Active Problems:   Long term (current) use of anticoagulants   Essential hypertension   History of DVT (deep vein thrombosis)   GERD   Hyperlipidemia   Factor V deficiency (HCC)   History of respiratory failure   Insomnia   Osteoarthritis, multiple sites   Osteoporosis   History of recurrent UTIs   Multiple rib fractures, right 10-12   Robin Harrell hematuria   Traumatic hematoma of flank   Fall with 3 rib fractures and abdominal flank hematoma and hematuria.  Plan:  Medical admission.  Work-up for fall per medicine.  Neurochecks.  Perhaps consider physical/occupational therapy evaluation.  Hold anticoagulation.  Pain control and mobilization with rib fractures.  While at increased risk of contusion and respiratory failure, there is no strong evidence of decline at this moment.  Most likely ribs will heal nonoperatively but will defer to trauma service reevaluation.  No evidence of any renal injury or bladder perforation or injury with hematuria.  Hopefully should resolve.  Otherwise may have to consult urology if persistent.  VTE prophylaxis- SCDs, etc  Mobilize as tolerated to help recovery.  Perhaps consider physical/occupational therapy evaluation.   45 minutes spent in review, evaluation, examination, counseling, and coordination of care.  More than 50% of that time was spent in counseling.  Adin Hector, MD, FACS, MASCRS Gastrointestinal and Minimally Invasive Surgery    [address], [place], [place] [address] [phone]  Main / Paging [phone] Fax   08/10/2018      Past Medical History:  Diagnosis Date   Allergic rhinitis    Cough    Difficulty in swallowing    w/ occasional aspiration   DVT (deep venous thrombosis) (HCC)    Dyslipidemia    Factor V Leiden deficiency     lifelong coumadin   GERD (gastroesophageal reflux disease)    History of nuclear stress test 12/28/2007   lexiscan; low risk    HTN (hypertension)    PND (post-nasal drip)    Pulmonary embolism (HCC)    PVC's (premature ventricular contractions)    Thyroid disease     Past Surgical History:  Procedure Laterality Date   APPENDECTOMY     BUNIONECTOMY     CATARACT EXTRACTION W/ INTRAOCULAR LENS IMPLANT Right 11/02/2013   CHOLECYSTECTOMY  1973   ESOPHAGEAL DILATION     HAMMER TOE SURGERY     history of sleep study  12/27/2007   AHI during total sleep 0.9/hr and REM 1.5/hr; RDI during total sleep6.0/hr and REM 6.1/hr   REPLACEMENT TOTAL KNEE  01/11/2002   right   TRANSTHORACIC ECHOCARDIOGRAM  12/28/2007   borderline conc LVH with normal systolic function; MV mod thickened with mild MVP & mild MR    Social History   Socioeconomic History   Marital status: Married    Spouse name: Not on file   Number of children: 4   Years of education: 13.5   Highest education level: Not on file  Occupational History   Occupation: Network engineer - retired  Scientist, product/process development strain: Not on file   Food insecurity    Worry: Not on file    Inability: Not on Lexicographer needs    Medical: Not on file    Non-medical: Not on file  Tobacco Use   Smoking status: Former Smoker    Years: 1.00    Types: Cigarettes    Quit date: 02/21/1958    Years since quitting: 60.5   Smokeless tobacco: Never Used  Substance and Sexual Activity   Alcohol use: Yes    Comment: rareley one glass of wine   Drug use: No   Sexual activity: Never  Lifestyle   Physical activity    Days per week: Not on file    Minutes per session: Not on file   Stress: Not on file  Relationships   Social connections    Talks on phone: Not on file    Gets together: Not on file    Attends religious service: Not on file    Active member of club or organization: Not on file     Attends meetings of clubs or organizations: Not on file    Relationship status: Not on file   Intimate partner violence    Fear of current or ex partner: Not on file    Emotionally abused: Not on file    Physically abused: Not on file    Forced sexual activity: Not on file  Other Topics Concern   Not on file  Social History Narrative   Not on file    Family History  Problem Relation Age of Onset   Lung cancer Father    Cirrhosis Father    Coronary artery disease Maternal Grandfather        MI   Throat cancer Maternal Grandfather    Heart disease Paternal Grandmother    Coronary artery disease Paternal Grandfather  MI   Throat cancer Paternal Grandfather    Allergic rhinitis Sister    Skin cancer Brother    Breast cancer Daughter 62    Current Facility-Administered Medications  Medication Dose Route Frequency Provider Last Rate Last Dose   acetaminophen (TYLENOL) tablet 1,000 mg  1,000 mg Oral TID Michael Boston, MD       fentaNYL (SUBLIMAZE) injection 25-50 mcg  25-50 mcg Intravenous Q1H PRN Michael Boston, MD       gabapentin (NEURONTIN) capsule 300 mg  300 mg Oral QHS Michael Boston, MD       sodium chloride (PF) 0.9 % injection            traMADol (ULTRAM) tablet 50-100 mg  50-100 mg Oral Q6H PRN Michael Boston, MD       Current Outpatient Medications  Medication Sig Dispense Refill   acetaminophen (TYLENOL) 500 MG tablet Take 1,000 mg by mouth every 6 (six) hours as needed for moderate pain.     atorvastatin (LIPITOR) 10 MG tablet Take 1 tablet (10 mg total) by mouth daily at 6 PM. 90 tablet 2   brimonidine (ALPHAGAN) 0.15 % ophthalmic solution Place 1 drop into both eyes 2 (two) times daily.  3   Calcium-Phosphorus-Vitamin D (CALCIUM/D3 ADULT GUMMIES) 200-96.6-200 MG-MG-UNIT CHEW Chew 2 tablets by mouth daily.     chlorpheniramine (CHLOR-TRIMETON) 4 MG tablet Take 4 mg by mouth every morning.      ipratropium (ATROVENT) 0.03 % nasal spray  Place 2 sprays into both nostrils 3 (three) times daily as needed for rhinitis. 30 mL 11   latanoprost (XALATAN) 0.005 % ophthalmic solution Place 1 drop into both eyes at bedtime.      levothyroxine (SYNTHROID, LEVOTHROID) 25 MCG tablet TAKE 1 TABLET BY MOUTH EVERY DAY (Patient taking differently: Take 25 mcg by mouth daily before breakfast. ) 90 tablet 3   losartan (COZAAR) 50 MG tablet TAKE 1 TABLET BY MOUTH EVERY DAY (Patient taking differently: Take 50 mg by mouth daily. ) 90 tablet 1   metoprolol tartrate (LOPRESSOR) 25 MG tablet TAKE 1 TABLET (25 MG TOTAL) BY MOUTH 2 (TWO) TIMES DAILY. KEEP OV. (Patient taking differently: Take 25 mg by mouth 2 (two) times daily. ) 180 tablet 3   multivitamin-iron-minerals-folic acid (CENTRUM) chewable tablet Chew 1 tablet by mouth daily.     vitamin E (VITAMIN E) 1000 UNIT capsule Take 1,000 Units by mouth daily.     warfarin (COUMADIN) 7.5 MG tablet Take 1/2 to 1 tablet by mouth daily as directed (Patient taking differently: Take 3.75-7.5 mg by mouth See admin instructions. 7.5mg  Sunday, Tuesday, Thursday and Saturday. 3.75 mg Monday, Wednesday and Friday) 90 tablet 1   cetirizine (ZYRTEC) 10 MG tablet TAKE 1 TABLET BY MOUTH EVERY DAY (Patient not taking: Reported on 08/10/2018) 90 tablet 3     Allergies  Allergen Reactions   Codeine Nausea Only   Erythromycin Nausea And Vomiting    ROS:   All other systems reviewed & are negative except per HPI or as noted below: Constitutional:  No fevers, chills, sweats.  Weight stable Eyes:  No vision changes, No discharge HENT:  No sore throats, nasal drainage Lymph: No neck swelling, No bruising easily Pulmonary:  No cough, productive sputum.  + right chest wall pain CV: No orthopnea, PND  Patient walks 15 minutes with a cane without difficulty.  No exertional neck/shoulder/arm pain. GI:  No personal nor family history of GI/colon cancer, inflammatory bowel disease, irritable bowel  syndrome, allergy  such as Celiac Sprue, dietary/dairy problems, colitis, ulcers nor gastritis.  No recent sick contacts/gastroenteritis.  No travel outside the country.  No changes in diet. Renal: No UTIs, ++ hematuria Genital:  No drainage, bleeding, masses Musculoskeletal: No severe joint pain.  Good ROM major joints Skin:  No sores or lesions.  No rashes Heme/Lymph:  No easy bleeding.  No swollen lymph nodes Neuro: No focal weakness/numbness.  No seizures Psych: No suicidal ideation.  No hallucinations  BP (!) 149/101    Pulse 86    Temp 98.1 F (36.7 C) (Oral)    Resp 18    LMP  (LMP Unknown)    SpO2 96%   Physical Exam: General: Pt awake/alert/oriented x4 in mild major acute distress Eyes: PERRL, normal EOM. Sclera nonicteric Neuro: CN II-XII intact w/o focal sensory/motor deficits. Lymph: No head/neck/groin lymphadenopathy Psych:  No delerium/psychosis/paranoia HENT: Normocephalic, Mucus membranes moist.  No thrush Neck: Supple, No tracheal deviation Chest: Sharp pain on right lower rib cage along anterior axillary line.  No pain on left side.  Fair respiratory excursion. CV:  Pulses intact.  Regular rhythm Abdomen: Soft, Nondistended.  Tenderness along right lateral and posterior flank with some mild ecchymosis.  Central abdomen and left abdomen nontender.  No incarcerated hernias. Gen:  No inguinal hernias.  No inguinal lymphadenopathy.   Ext:  SCDs BLE.  No significant edema.  No cyanosis Skin: No petechiae / purpurea.  No major sores Musculoskeletal: No severe joint pain.  Good ROM major joints   Results:   Labs: Results for orders placed or performed during the hospital encounter of 08/10/18 (from the past 48 hour(s))  Comprehensive metabolic panel     Status: Abnormal   Collection Time: 08/10/18  8:06 PM  Result Value Ref Range   Sodium 139 135 - 145 mmol/L   Potassium 4.3 3.5 - 5.1 mmol/L   Chloride 103 98 - 111 mmol/L   CO2 25 22 - 32 mmol/L   Glucose, Bld 116 (H) 70 - 99 mg/dL    BUN 21 8 - 23 mg/dL   Creatinine, Ser 0.92 0.44 - 1.00 mg/dL   Calcium 9.9 8.9 - 10.3 mg/dL   Total Protein 7.5 6.5 - 8.1 g/dL   Albumin 4.3 3.5 - 5.0 g/dL   AST 32 15 - 41 U/L   ALT 21 0 - 44 U/L   Alkaline Phosphatase 62 38 - 126 U/L   Total Bilirubin 0.7 0.3 - 1.2 mg/dL   GFR calc non Af Amer 58 (L) >60 mL/min   GFR calc Af Amer >60 >60 mL/min   Anion gap 11 5 - 15    Comment: Performed at [facility], [place] [address] [place], [place] [address]  Lipase, blood     Status: None   Collection Time: 08/10/18  8:06 PM  Result Value Ref Range   Lipase 33 11 - 51 U/L    Comment: Performed at [facility], [place] [address] [place], [place] [address]  CBC with Differential     Status: Abnormal   Collection Time: 08/10/18  8:06 PM  Result Value Ref Range   WBC 5.1 4.0 - 10.5 K/uL   RBC 4.07 3.87 - 5.11 MIL/uL   Hemoglobin 12.6 12.0 - 15.0 g/dL   HCT 39.3 36.0 - 46.0 %   MCV 96.6 80.0 - 100.0 fL   MCH 31.0 26.0 - 34.0 pg   MCHC 32.1 30.0 - 36.0 g/dL  RDW 13.2 11.5 - 15.5 %   Platelets 119 (L) 150 - 400 K/uL   nRBC 0.0 0.0 - 0.2 %   Neutrophils Relative % 67 %   Neutro Abs 3.4 1.7 - 7.7 K/uL   Lymphocytes Relative 21 %   Lymphs Abs 1.1 0.7 - 4.0 K/uL   Monocytes Relative 10 %   Monocytes Absolute 0.5 0.1 - 1.0 K/uL   Eosinophils Relative 1 %   Eosinophils Absolute 0.1 0.0 - 0.5 K/uL   Basophils Relative 1 %   Basophils Absolute 0.0 0.0 - 0.1 K/uL   Immature Granulocytes 0 %   Abs Immature Granulocytes 0.02 0.00 - 0.07 K/uL    Comment: Performed at [facility], [place] [address], [place], [place] [address]  Protime-INR     Status: Abnormal   Collection Time: 08/10/18  8:06 PM  Result Value Ref Range   Prothrombin Time 30.8 (H) 11.4 - 15.2 seconds   INR 3.0 (H) 0.8 - 1.2    Comment: (NOTE) INR goal varies based on device and disease states. Performed at [facility], [place] [address], [place], [place]  [address]   APTT     Status: Abnormal   Collection Time: 08/10/18  8:06 PM  Result Value Ref Range   aPTT 47 (H) 24 - 36 seconds    Comment:        IF BASELINE aPTT IS ELEVATED, SUGGEST PATIENT RISK ASSESSMENT BE USED TO DETERMINE APPROPRIATE ANTICOAGULANT THERAPY. Performed at [facility], [place] [address], [place], [place] [address]   Urinalysis, Routine w reflex microscopic     Status: Abnormal   Collection Time: 08/10/18  8:24 PM  Result Value Ref Range   Color, Urine YELLOW YELLOW   APPearance CLOUDY (A) CLEAR   Specific Gravity, Urine 1.012 1.005 - 1.030   pH 6.0 5.0 - 8.0   Glucose, UA NEGATIVE NEGATIVE mg/dL   Hgb urine dipstick LARGE (A) NEGATIVE   Bilirubin Urine NEGATIVE NEGATIVE   Ketones, ur NEGATIVE NEGATIVE mg/dL   Protein, ur 100 (A) NEGATIVE mg/dL   Nitrite NEGATIVE NEGATIVE   Leukocytes,Ua NEGATIVE NEGATIVE   RBC / HPF >50 (H) 0 - 5 RBC/hpf   WBC, UA 0-5 0 - 5 WBC/hpf   Bacteria, UA NONE SEEN NONE SEEN    Comment: Performed at [facility], [place] [address] [place], [place] [address]    Imaging / Studies: Ct Head Wo Contrast  Result Date: 08/10/2018 CLINICAL DATA:  Tripped and hit pew at church EXAM: CT HEAD WITHOUT CONTRAST CT CERVICAL SPINE WITHOUT CONTRAST TECHNIQUE: Multidetector CT imaging of the head and cervical spine was performed following the standard protocol without intravenous contrast. Multiplanar CT image reconstructions of the cervical spine were also generated. COMPARISON:  None. FINDINGS: CT HEAD FINDINGS Brain: No acute territorial infarction, hemorrhage or intracranial mass. Mild to moderate atrophy. Moderate small vessel ischemic changes of the white matter. Prominent ventricles felt secondary to atrophy. Vascular: No hyperdense vessels.  Carotid vascular calcification. Skull: Normal. Negative for fracture or focal lesion. Sinuses/Orbits: No acute finding. Other: None CT CERVICAL SPINE FINDINGS Alignment: No  subluxation.  Facet alignment is within normal limits. Skull base and vertebrae: No acute fracture. No primary bone lesion or focal pathologic process. Soft tissues and spinal canal: No prevertebral fluid or swelling. No visible canal hematoma. Disc levels: Moderate-to-marked diffuse degenerative changes C3 through C7 with mild degenerative changes at C2-C3. Multiple level bilateral facet degenerative change  with multiple level bilateral foraminal stenosis. Upper chest: Apical scarring.  Carotid vascular calcification. Other: None IMPRESSION: 1. No CT evidence for acute intracranial abnormality. Atrophy with small vessel ischemic changes of the white matter 2. Straightening of the cervical spine with degenerative changes. No acute osseous abnormality. Electronically Signed   By: Donavan Foil M.D.   On: 08/10/2018 22:12   Ct Chest W Contrast  Result Date: 08/10/2018 CLINICAL DATA:  Fall at church with right rib pain. Abd trauma, blunt, stable; Chest trauma, blunt, low energy, neg xray, normal exam and mental status EXAM: CT CHEST, ABDOMEN, AND PELVIS WITH CONTRAST TECHNIQUE: Multidetector CT imaging of the chest, abdomen and pelvis was performed following the standard protocol during bolus administration of intravenous contrast. CONTRAST:  124mL OMNIPAQUE IOHEXOL 300 MG/ML  SOLN COMPARISON:  Chest and pelvic radiographs earlier this day FINDINGS: CT CHEST FINDINGS Cardiovascular: No acute vascular injury. Aortic atherosclerosis and tortuosity. Coronary artery calcifications. No pericardial fluid. Mediastinum/Nodes: No mediastinal hematoma or hemorrhage. Patulous esophagus. Normal thyroid gland. Small mediastinal nodes not enlarged by size criteria. Lungs/Pleura: No pneumothorax. No evidence of pulmonary contusion. Streaky opacities in the dependent lower lobes, favoring atelectasis. Biapical pleuroparenchymal scarring. No significant pleural fluid. Musculoskeletal: Right rib fractures of ribs 10 through 12. The  tenth and eleventh rib fractures are segmental and fractured both anterior and posteriorly. Twelfth rib fracture is displaced. Associated subcutaneous soft tissue contusion in the posterior right flank. No fracture of the sternum, left ribs, included clavicles or shoulder girdles. Multilevel degenerative change in the thoracic spine. No fracture of the thoracic spine. CT ABDOMEN PELVIS FINDINGS Hepatobiliary: No hepatic injury or perihepatic hematoma. Punctate hepatic granuloma. Gallbladder is absent. Pancreas: No ductal dilatation or inflammation. Spleen: No splenic injury or perisplenic hematoma. 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 Adrenals/Urinary Tract: No adrenal hemorrhage or renal injury identified. Homogeneous renal enhancement with symmetric excretion on delayed phase imaging. Bladder is unremarkable. Stomach/Bowel: Bowel evaluation limited by a lack of contrast, mild motion artifact, and paucity of intra-abdominal fat. No evidence of bowel injury. No bowel wall thickening or inflammation. No mesenteric hematoma. Colonic diverticulosis without diverticulitis. Prior appendectomy per history. No free air. Vascular/Lymphatic: No vascular injury. Aortic atherosclerosis without aneurysm. No retroperitoneal fluid. Slight prominence of the left ovarian vein. 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 No bulky abdominopelvic adenopathy. Reproductive: Uterus and bilateral adnexa are unremarkable. Other: No free air or free fluid. Musculoskeletal: Subcutaneous soft tissue edema in the posterior right flank soft tissues. Slight large mint of right paraspinal musculature likely intramuscular edema. Nondisplaced right transverse process fracture of L1. No additional fracture of the lumbar spine. No pelvic fracture. Multilevel lumbar degenerative change in scoliosis. Pubic symphyseal  degenerative change. IMPRESSION: 1. Right rib fractures 10 through 12. The tenth and eleventh rib fractures are segmental. Twelfth rib fracture is displaced. Associated subcutaneous contusion in the posterior right flank. No pneumothorax. 2. Nondisplaced right L1 transverse process fracture. 3. No additional acute traumatic injury to the chest, abdomen, or pelvis. 4. Aortic Atherosclerosis (ICD10-I70.0). Coronary artery calcifications. Electronically Signed   By: Keith Rake M.D.   On: 08/10/2018 22:14   Ct Cervical Spine Wo Contrast  Result Date: 08/10/2018 CLINICAL DATA:  Tripped and hit pew at church EXAM: CT HEAD WITHOUT CONTRAST CT CERVICAL SPINE WITHOUT CONTRAST TECHNIQUE: Multidetector CT imaging of the head and cervical spine was performed following the standard protocol  without intravenous contrast. Multiplanar CT image reconstructions of the cervical spine were also generated. COMPARISON:  None. FINDINGS: CT HEAD FINDINGS Brain: No acute territorial infarction, hemorrhage or intracranial mass. Mild to moderate atrophy. Moderate small vessel ischemic changes of the white matter. Prominent ventricles felt secondary to atrophy. Vascular: No hyperdense vessels.  Carotid vascular calcification. Skull: Normal. Negative for fracture or focal lesion. Sinuses/Orbits: No acute finding. Other: None CT CERVICAL SPINE FINDINGS Alignment: No subluxation.  Facet alignment is within normal limits. Skull base and vertebrae: No acute fracture. No primary bone lesion or focal pathologic process. Soft tissues and spinal canal: No prevertebral fluid or swelling. No visible canal hematoma. Disc levels: Moderate-to-marked diffuse degenerative changes C3 through C7 with mild degenerative changes at C2-C3. Multiple level bilateral facet degenerative change with multiple level bilateral foraminal stenosis. Upper chest: Apical scarring.  Carotid vascular calcification. Other: None IMPRESSION: 1. No CT evidence for acute  intracranial abnormality. Atrophy with small vessel ischemic changes of the white matter 2. Straightening of the cervical spine with degenerative changes. No acute osseous abnormality. Electronically Signed   By: Donavan Foil M.D.   On: 08/10/2018 22:12   Ct Abdomen Pelvis W Contrast  Result Date: 08/10/2018 CLINICAL DATA:  Fall at church with right rib pain. Abd trauma, blunt, stable; Chest trauma, blunt, low energy, neg xray, normal exam and mental status EXAM: CT CHEST, ABDOMEN, AND PELVIS WITH CONTRAST TECHNIQUE: Multidetector CT imaging of the chest, abdomen and pelvis was performed following the standard protocol during bolus administration of intravenous contrast. CONTRAST:  189mL OMNIPAQUE IOHEXOL 300 MG/ML  SOLN COMPARISON:  Chest and pelvic radiographs earlier this day FINDINGS: CT CHEST FINDINGS Cardiovascular: No acute vascular injury. Aortic atherosclerosis and tortuosity. Coronary artery calcifications. No pericardial fluid. Mediastinum/Nodes: No mediastinal hematoma or hemorrhage. Patulous esophagus. Normal thyroid gland. Small mediastinal nodes not enlarged by size criteria. Lungs/Pleura: No pneumothorax. No evidence of pulmonary contusion. Streaky opacities in the dependent lower lobes, favoring atelectasis. Biapical pleuroparenchymal scarring. No significant pleural fluid. Musculoskeletal: Right rib fractures of ribs 10 through 12. The tenth and eleventh rib fractures are segmental and fractured both anterior and posteriorly. Twelfth rib fracture is displaced. Associated subcutaneous soft tissue contusion in the posterior right flank. No fracture of the sternum, left ribs, included clavicles or shoulder girdles. Multilevel degenerative change in the thoracic spine. No fracture of the thoracic spine. CT ABDOMEN PELVIS FINDINGS Hepatobiliary: No hepatic injury or perihepatic hematoma. Punctate hepatic granuloma. Gallbladder is absent. Pancreas: No ductal dilatation or inflammation. Spleen: No  splenic injury or perisplenic hematoma. 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 Adrenals/Urinary Tract: No adrenal hemorrhage or renal injury identified. Homogeneous renal enhancement with symmetric excretion on delayed phase imaging. Bladder is unremarkable. Stomach/Bowel: Bowel evaluation limited by a lack of contrast, mild motion artifact, and paucity of intra-abdominal fat. No evidence of bowel injury. No bowel wall thickening or inflammation. No mesenteric hematoma. Colonic diverticulosis without diverticulitis. Prior appendectomy per history. No free air. Vascular/Lymphatic: No vascular injury. Aortic atherosclerosis without aneurysm. No retroperitoneal fluid. Slight prominence of the left ovarian vein. 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 No bulky abdominopelvic adenopathy. Reproductive: Uterus and bilateral adnexa are unremarkable. Other: No free air or free fluid. Musculoskeletal: Subcutaneous soft tissue edema in the posterior right flank soft tissues. Slight large mint of right paraspinal musculature likely intramuscular edema. Nondisplaced right transverse process fracture of L1. No  additional fracture of the lumbar spine. No pelvic fracture. Multilevel lumbar degenerative change in scoliosis. Pubic symphyseal degenerative change. IMPRESSION: 1. Right rib fractures 10 through 12. The tenth and eleventh rib fractures are segmental. Twelfth rib fracture is displaced. Associated subcutaneous contusion in the posterior right flank. No pneumothorax. 2. Nondisplaced right L1 transverse process fracture. 3. No additional acute traumatic injury to the chest, abdomen, or pelvis. 4. Aortic Atherosclerosis (ICD10-I70.0). Coronary artery calcifications. Electronically Signed   By: Keith Rake M.D.   On: 08/10/2018 22:14   Dg Pelvis Portable  Result Date: 08/10/2018 CLINICAL  DATA:  Post fall with right back pain. Tripped at church striking a pew. EXAM: PORTABLE PELVIS 1-2 VIEWS COMPARISON:  None. FINDINGS: The cortical margins of the bony pelvis are intact. No fracture. Chronic changes at the pubic symphysis without evidence of widening. The sacroiliac joints are congruent. Age related acetabular spurring. Both femoral heads are well-seated in the respective acetabula. IMPRESSION: No pelvic fracture. Electronically Signed   By: Keith Rake M.D.   On: 08/10/2018 20:24   Dg Chest Port 1 View  Result Date: 08/10/2018 CLINICAL DATA:  Right lower chest pain after fall. EXAM: PORTABLE CHEST 1 VIEW COMPARISON:  03/23/2016 FINDINGS: Cardiomediastinal silhouette is mildly enlarged. Calcific atherosclerotic disease of the aorta. There is no evidence of focal airspace consolidation, pleural effusion or pneumothorax. Displaced right-sided rib fracture, likely lateral tenth rib. Soft tissues are grossly normal. IMPRESSION: 1. Displaced right-sided rib fracture, likely lateral tenth rib. 2. No evidence of pneumothorax. Electronically Signed   By: Fidela Salisbury M.D.   On: 08/10/2018 20:24    Medications / Allergies: per chart  Antibiotics: Anti-infectives (From admission, onward)   None        Note: Portions of this report may have been transcribed using voice recognition software. Every effort was made to ensure accuracy; however, inadvertent computerized transcription errors may be present.   Any transcriptional errors that result from this process are unintentional.    Adin Hector, MD, FACS, MASCRS [facility] In[facility]    [address] [place], [place] [address] [phone] Main / Paging [phone] Fax   08/10/2018

## 2018-08-10 NOTE — H&P (Addendum)
History and Physical    Robin Harrell QVZ:[id number] DOB: August 23, 1933 DOA: 08/10/2018  PCP: [name], MD   Patient coming from: Home   Chief Complaint: Fall with right flank pain   HPI: Robin Harrell is a 85 y.o. 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  Patient reports that she been in her usual state of health and was having an uneventful day when she was at church this evening and tripped, striking her right side against the corner of a pew.  She did not hit her head or lose consciousness.  She typically ambulates with a cane, denies any recent lightheadedness or presyncope, denies any recent worsening in her coordination.  She has been experiencing severe pain at the right flank which prompts her presentation and she also noted some gross hematuria while in the ED, but without any difficulty voiding.  ED Course: Upon arrival to the ED, patient is found to be afebrile, saturating low 90s on room air, and with remaining vitals stable.  Chest x-ray demonstrates displaced right rib fracture.  Radiographs of the pelvis are negative.  CT chest/abdomen/pelvis is notable for right rib fractures 10 through 12 and associated subcutaneous contusion, as well as nondisplaced right L1 transverse process fracture.  No injury to the kidneys, ureters, or bladder identified on CT.  Patient was given IV fluids and analgesia in the emergency department and surgery was consulted by the ED physician, recommending a medical admission to [facility].  Review of Systems:  All other systems reviewed and apart from HPI, are negative.  Past Medical History:  Diagnosis Date   Allergic rhinitis    Cough    Difficulty in swallowing    w/ occasional aspiration   DVT (deep venous thrombosis) (HCC)     Dyslipidemia    Factor V Leiden deficiency    lifelong coumadin   GERD (gastroesophageal reflux disease)    History of nuclear stress test 12/28/2007   lexiscan; low risk    HTN (hypertension)    PND (post-nasal drip)    Pulmonary embolism (HCC)    PVC's (premature ventricular contractions)    Thyroid disease     Past Surgical History:  Procedure Laterality Date   APPENDECTOMY     BUNIONECTOMY     CATARACT EXTRACTION W/ INTRAOCULAR LENS IMPLANT Right 11/02/2013   CHOLECYSTECTOMY  1973   ESOPHAGEAL DILATION     HAMMER TOE SURGERY     history of sleep study  12/27/2007   AHI during total sleep 0.9/hr and REM 1.5/hr; RDI during total sleep6.0/hr and REM 6.1/hr   REPLACEMENT TOTAL KNEE  01/11/2002   right   TRANSTHORACIC ECHOCARDIOGRAM  12/28/2007   borderline conc LVH with normal systolic function; MV mod thickened with mild MVP & mild MR     reports that she quit smoking about 60 years ago. Her smoking use included cigarettes. She quit after 1.00 year of use. She has never used smokeless tobacco. She reports current alcohol use. She reports that she does not use drugs.  Allergies  Allergen Reactions   Codeine Nausea Only   Erythromycin Nausea And Vomiting    Family History  Problem Relation Age of Onset   Lung cancer Father    Cirrhosis Father    Coronary artery disease Maternal Grandfather  MI   Throat cancer Maternal Grandfather    Heart disease Paternal Grandmother    Coronary artery disease Paternal Grandfather        MI   Throat cancer Paternal Grandfather    Allergic rhinitis Sister    Skin cancer Brother    Breast cancer Daughter 77     Prior to Admission medications   Medication Sig Start Date End Date Taking? Authorizing Provider  acetaminophen (TYLENOL) 500 MG tablet Take 1,000 mg by mouth every 6 (six) hours as needed for moderate pain.   Yes [provider]  atorvastatin (LIPITOR) 10 MG tablet Take 1 tablet  (10 mg total) by mouth daily at 6 PM. 12/25/17  Yes Lorretta Harp, MD  brimonidine (ALPHAGAN) 0.15 % ophthalmic solution Place 1 drop into both eyes 2 (two) times daily. 05/28/17  Yes [provider]  Calcium-Phosphorus-Vitamin D (CALCIUM/D3 ADULT GUMMIES) 200-96.6-200 MG-MG-UNIT CHEW Chew 2 tablets by mouth daily.   Yes [provider]  chlorpheniramine (CHLOR-TRIMETON) 4 MG tablet Take 4 mg by mouth every morning.    Yes [provider]  ipratropium (ATROVENT) 0.03 % nasal spray Place 2 sprays into both nostrils 3 (three) times daily as needed for rhinitis. 03/15/18  Yes Collene Gobble, MD  latanoprost (XALATAN) 0.005 % ophthalmic solution Place 1 drop into both eyes at bedtime.  10/08/10  Yes [provider]  levothyroxine (SYNTHROID, LEVOTHROID) 25 MCG tablet TAKE 1 TABLET BY MOUTH EVERY DAY Patient taking differently: Take 25 mcg by mouth daily before breakfast.  05/03/18  Yes [name], MD  losartan (COZAAR) 50 MG tablet TAKE 1 TABLET BY MOUTH EVERY DAY Patient taking differently: Take 50 mg by mouth daily.  03/26/18  Yes [name], MD  metoprolol tartrate (LOPRESSOR) 25 MG tablet TAKE 1 TABLET (25 MG TOTAL) BY MOUTH 2 (TWO) TIMES DAILY. KEEP OV. Patient taking differently: Take 25 mg by mouth 2 (two) times daily.  11/09/17  Yes Lorretta Harp, MD  multivitamin-iron-minerals-folic acid (CENTRUM) chewable tablet Chew 1 tablet by mouth daily.   Yes [provider]  vitamin E (VITAMIN E) 1000 UNIT capsule Take 1,000 Units by mouth daily.   Yes [provider]  warfarin (COUMADIN) 7.5 MG tablet Take 1/2 to 1 tablet by mouth daily as directed Patient taking differently: Take 3.75-7.5 mg by mouth See admin instructions. 7.5mg  Sunday, Tuesday, Thursday and Saturday. 3.75 mg Monday, Wednesday and Friday 04/03/18  Yes Lorretta Harp, MD  cetirizine (ZYRTEC) 10 MG tablet TAKE 1 TABLET BY MOUTH EVERY DAY Patient not taking: Reported on  08/10/2018 04/09/18   Collene Gobble, MD    Physical Exam: Vitals:   08/10/18 2156 08/10/18 2200 08/10/18 2230 08/10/18 2241  BP: (!) 170/70 (!) 149/101 (!) 159/77   Pulse: 66 86  96  Resp: 18 18    Temp:      TempSrc:      SpO2: 95% 96%  92%    Constitutional: NAD, appears uncomfortable  Eyes: PERTLA, lids and conjunctivae normal ENMT: Mucous membranes are moist. Posterior pharynx clear of any exudate or lesions.   Neck: normal, supple, no masses, no thyromegaly Respiratory:  no wheezing, no crackles. No accessory muscle use.  Cardiovascular: S1 & S2 heard, regular rate and rhythm. Trace pitting edema to bilateral LE's. Abdomen: No distension, no tenderness, soft. Bowel sounds active.  Musculoskeletal: Tender at right lateral chest wall. No joint deformity upper and lower extremities.   Skin: no  significant rashes, lesions, ulcers. Warm, dry, well-perfused. Neurologic: CN 2-12 grossly intact. Sensation intact. Strength 5/5 in all 4 limbs.  Psychiatric: Alert and oriented to person, place, and situation. Calm, cooperative.    Labs on Admission: I have personally reviewed following labs and imaging studies  CBC: Recent Labs  Lab 08/10/18 2006  WBC 5.1  NEUTROABS 3.4  HGB 12.6  HCT 39.3  MCV 96.6  PLT 947*   Basic Metabolic Panel: Recent Labs  Lab 08/10/18 2006  NA 139  K 4.3  CL 103  CO2 25  GLUCOSE 116*  BUN 21  CREATININE 0.92  CALCIUM 9.9   GFR: Estimated Creatinine Clearance: 37.3 mL/min (by C-G formula based on SCr of 0.92 mg/dL). Liver Function Tests: Recent Labs  Lab 08/10/18 2006  AST 32  ALT 21  ALKPHOS 62  BILITOT 0.7  PROT 7.5  ALBUMIN 4.3   Recent Labs  Lab 08/10/18 2006  LIPASE 33   No results for input(s): AMMONIA in the last 168 hours. Coagulation Profile: Recent Labs  Lab 08/10/18 2006  INR 3.0*   Cardiac Enzymes: No results for input(s): CKTOTAL, CKMB, CKMBINDEX, TROPONINI in the last 168 hours. BNP (last 3 results) No  results for input(s): PROBNP in the last 8760 hours. HbA1C: No results for input(s): HGBA1C in the last 72 hours. CBG: No results for input(s): GLUCAP in the last 168 hours. Lipid Profile: No results for input(s): CHOL, HDL, LDLCALC, TRIG, CHOLHDL, LDLDIRECT in the last 72 hours. Thyroid Function Tests: No results for input(s): TSH, T4TOTAL, FREET4, T3FREE, THYROIDAB in the last 72 hours. Anemia Panel: No results for input(s): VITAMINB12, FOLATE, FERRITIN, TIBC, IRON, RETICCTPCT in the last 72 hours. Urine analysis:    Component Value Date/Time   COLORURINE YELLOW 08/10/2018 2024   APPEARANCEUR CLOUDY (A) 08/10/2018 2024   LABSPEC 1.012 08/10/2018 2024   PHURINE 6.0 08/10/2018 2024   GLUCOSEU NEGATIVE 08/10/2018 2024   HGBUR LARGE (A) 08/10/2018 2024   BILIRUBINUR NEGATIVE 08/10/2018 2024   KETONESUR NEGATIVE 08/10/2018 2024   PROTEINUR 100 (A) 08/10/2018 2024   NITRITE NEGATIVE 08/10/2018 2024   LEUKOCYTESUR NEGATIVE 08/10/2018 2024   Sepsis Labs: @LABRCNTIP (procalcitonin:4,lacticidven:4) )No results found for this or any previous visit (from the past 240 hour(s)).   Radiological Exams on Admission: Ct Head Wo Contrast  Result Date: 08/10/2018 CLINICAL DATA:  Tripped and hit pew at church EXAM: CT HEAD WITHOUT CONTRAST CT CERVICAL SPINE WITHOUT CONTRAST TECHNIQUE: Multidetector CT imaging of the head and cervical spine was performed following the standard protocol without intravenous contrast. Multiplanar CT image reconstructions of the cervical spine were also generated. COMPARISON:  None. FINDINGS: CT HEAD FINDINGS Brain: No acute territorial infarction, hemorrhage or intracranial mass. Mild to moderate atrophy. Moderate small vessel ischemic changes of the white matter. Prominent ventricles felt secondary to atrophy. Vascular: No hyperdense vessels.  Carotid vascular calcification. Skull: Normal. Negative for fracture or focal lesion. Sinuses/Orbits: No acute finding. Other: None  CT CERVICAL SPINE FINDINGS Alignment: No subluxation.  Facet alignment is within normal limits. Skull base and vertebrae: No acute fracture. No primary bone lesion or focal pathologic process. Soft tissues and spinal canal: No prevertebral fluid or swelling. No visible canal hematoma. Disc levels: Moderate-to-marked diffuse degenerative changes C3 through C7 with mild degenerative changes at C2-C3. Multiple level bilateral facet degenerative change with multiple level bilateral foraminal stenosis. Upper chest: Apical scarring.  Carotid vascular calcification. Other: None IMPRESSION: 1. No CT evidence for acute intracranial abnormality. Atrophy with small  vessel ischemic changes of the white matter 2. Straightening of the cervical spine with degenerative changes. No acute osseous abnormality. Electronically Signed   By: Donavan Foil M.D.   On: 08/10/2018 22:12   Ct Chest W Contrast  Result Date: 08/10/2018 CLINICAL DATA:  Fall at church with right rib pain. Abd trauma, blunt, stable; Chest trauma, blunt, low energy, neg xray, normal exam and mental status EXAM: CT CHEST, ABDOMEN, AND PELVIS WITH CONTRAST TECHNIQUE: Multidetector CT imaging of the chest, abdomen and pelvis was performed following the standard protocol during bolus administration of intravenous contrast. CONTRAST:  133mL OMNIPAQUE IOHEXOL 300 MG/ML  SOLN COMPARISON:  Chest and pelvic radiographs earlier this day FINDINGS: CT CHEST FINDINGS Cardiovascular: No acute vascular injury. Aortic atherosclerosis and tortuosity. Coronary artery calcifications. No pericardial fluid. Mediastinum/Nodes: No mediastinal hematoma or hemorrhage. Patulous esophagus. Normal thyroid gland. Small mediastinal nodes not enlarged by size criteria. Lungs/Pleura: No pneumothorax. No evidence of pulmonary contusion. Streaky opacities in the dependent lower lobes, favoring atelectasis. Biapical pleuroparenchymal scarring. No significant pleural fluid. Musculoskeletal: Right  rib fractures of ribs 10 through 12. The tenth and eleventh rib fractures are segmental and fractured both anterior and posteriorly. Twelfth rib fracture is displaced. Associated subcutaneous soft tissue contusion in the posterior right flank. No fracture of the sternum, left ribs, included clavicles or shoulder girdles. Multilevel degenerative change in the thoracic spine. No fracture of the thoracic spine. CT ABDOMEN PELVIS FINDINGS Hepatobiliary: No hepatic injury or perihepatic hematoma. Punctate hepatic granuloma. Gallbladder is absent. Pancreas: No ductal dilatation or inflammation. Spleen: No splenic injury or perisplenic hematoma. 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 Adrenals/Urinary Tract: No adrenal hemorrhage or renal injury identified. Homogeneous renal enhancement with symmetric excretion on delayed phase imaging. Bladder is unremarkable. Stomach/Bowel: Bowel evaluation limited by a lack of contrast, mild motion artifact, and paucity of intra-abdominal fat. No evidence of bowel injury. No bowel wall thickening or inflammation. No mesenteric hematoma. Colonic diverticulosis without diverticulitis. Prior appendectomy per history. No free air. Vascular/Lymphatic: No vascular injury. Aortic atherosclerosis without aneurysm. No retroperitoneal fluid. Slight prominence of the left ovarian vein. 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 No bulky abdominopelvic adenopathy. Reproductive: Uterus and bilateral adnexa are unremarkable. Other: No free air or free fluid. Musculoskeletal: Subcutaneous soft tissue edema in the posterior right flank soft tissues. Slight large mint of right paraspinal musculature likely intramuscular edema. Nondisplaced right transverse process fracture of L1. No additional fracture of the lumbar spine. No pelvic fracture. Multilevel lumbar degenerative  change in scoliosis. Pubic symphyseal degenerative change. IMPRESSION: 1. Right rib fractures 10 through 12. The tenth and eleventh rib fractures are segmental. Twelfth rib fracture is displaced. Associated subcutaneous contusion in the posterior right flank. No pneumothorax. 2. Nondisplaced right L1 transverse process fracture. 3. No additional acute traumatic injury to the chest, abdomen, or pelvis. 4. Aortic Atherosclerosis (ICD10-I70.0). Coronary artery calcifications. Electronically Signed   By: Keith Rake M.D.   On: 08/10/2018 22:14   Ct Cervical Spine Wo Contrast  Result Date: 08/10/2018 CLINICAL DATA:  Tripped and hit pew at church EXAM: CT HEAD WITHOUT CONTRAST CT CERVICAL SPINE WITHOUT CONTRAST TECHNIQUE: Multidetector CT imaging of the head and cervical spine was performed following the standard protocol without intravenous contrast. Multiplanar CT image reconstructions of the cervical spine were also generated. COMPARISON:  None. FINDINGS: CT HEAD FINDINGS Brain: No acute territorial infarction, hemorrhage or  intracranial mass. Mild to moderate atrophy. Moderate small vessel ischemic changes of the white matter. Prominent ventricles felt secondary to atrophy. Vascular: No hyperdense vessels.  Carotid vascular calcification. Skull: Normal. Negative for fracture or focal lesion. Sinuses/Orbits: No acute finding. Other: None CT CERVICAL SPINE FINDINGS Alignment: No subluxation.  Facet alignment is within normal limits. Skull base and vertebrae: No acute fracture. No primary bone lesion or focal pathologic process. Soft tissues and spinal canal: No prevertebral fluid or swelling. No visible canal hematoma. Disc levels: Moderate-to-marked diffuse degenerative changes C3 through C7 with mild degenerative changes at C2-C3. Multiple level bilateral facet degenerative change with multiple level bilateral foraminal stenosis. Upper chest: Apical scarring.  Carotid vascular calcification. Other: None  IMPRESSION: 1. No CT evidence for acute intracranial abnormality. Atrophy with small vessel ischemic changes of the white matter 2. Straightening of the cervical spine with degenerative changes. No acute osseous abnormality. Electronically Signed   By: Donavan Foil M.D.   On: 08/10/2018 22:12   Ct Abdomen Pelvis W Contrast  Result Date: 08/10/2018 CLINICAL DATA:  Fall at church with right rib pain. Abd trauma, blunt, stable; Chest trauma, blunt, low energy, neg xray, normal exam and mental status EXAM: CT CHEST, ABDOMEN, AND PELVIS WITH CONTRAST TECHNIQUE: Multidetector CT imaging of the chest, abdomen and pelvis was performed following the standard protocol during bolus administration of intravenous contrast. CONTRAST:  161mL OMNIPAQUE IOHEXOL 300 MG/ML  SOLN COMPARISON:  Chest and pelvic radiographs earlier this day FINDINGS: CT CHEST FINDINGS Cardiovascular: No acute vascular injury. Aortic atherosclerosis and tortuosity. Coronary artery calcifications. No pericardial fluid. Mediastinum/Nodes: No mediastinal hematoma or hemorrhage. Patulous esophagus. Normal thyroid gland. Small mediastinal nodes not enlarged by size criteria. Lungs/Pleura: No pneumothorax. No evidence of pulmonary contusion. Streaky opacities in the dependent lower lobes, favoring atelectasis. Biapical pleuroparenchymal scarring. No significant pleural fluid. Musculoskeletal: Right rib fractures of ribs 10 through 12. The tenth and eleventh rib fractures are segmental and fractured both anterior and posteriorly. Twelfth rib fracture is displaced. Associated subcutaneous soft tissue contusion in the posterior right flank. No fracture of the sternum, left ribs, included clavicles or shoulder girdles. Multilevel degenerative change in the thoracic spine. No fracture of the thoracic spine. CT ABDOMEN PELVIS FINDINGS Hepatobiliary: No hepatic injury or perihepatic hematoma. Punctate hepatic granuloma. Gallbladder is absent. Pancreas: No ductal  dilatation or inflammation. Spleen: No splenic injury or perisplenic hematoma. 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 Adrenals/Urinary Tract: No adrenal hemorrhage or renal injury identified. Homogeneous renal enhancement with symmetric excretion on delayed phase imaging. Bladder is unremarkable. Stomach/Bowel: Bowel evaluation limited by a lack of contrast, mild motion artifact, and paucity of intra-abdominal fat. No evidence of bowel injury. No bowel wall thickening or inflammation. No mesenteric hematoma. Colonic diverticulosis without diverticulitis. Prior appendectomy per history. No free air. Vascular/Lymphatic: No vascular injury. Aortic atherosclerosis without aneurysm. No retroperitoneal fluid. Slight prominence of the left ovarian vein. 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 No bulky abdominopelvic adenopathy. Reproductive: Uterus and bilateral adnexa are unremarkable. Other: No free air or free fluid. Musculoskeletal: Subcutaneous soft tissue edema in the posterior right flank soft tissues. Slight large mint of right paraspinal musculature likely intramuscular edema. Nondisplaced right transverse process fracture of L1. No additional fracture of the lumbar spine. No pelvic fracture. Multilevel lumbar degenerative change in scoliosis. Pubic symphyseal degenerative change. IMPRESSION: 1. Right rib fractures 10 through 12. The  tenth and eleventh rib fractures are segmental. Twelfth rib fracture is displaced. Associated subcutaneous contusion in the posterior right flank. No pneumothorax. 2. Nondisplaced right L1 transverse process fracture. 3. No additional acute traumatic injury to the chest, abdomen, or pelvis. 4. Aortic Atherosclerosis (ICD10-I70.0). Coronary artery calcifications. Electronically Signed   By: Keith Rake M.D.   On: 08/10/2018 22:14   Dg Pelvis  Portable  Result Date: 08/10/2018 CLINICAL DATA:  Post fall with right back pain. Tripped at church striking a pew. EXAM: PORTABLE PELVIS 1-2 VIEWS COMPARISON:  None. FINDINGS: The cortical margins of the bony pelvis are intact. No fracture. Chronic changes at the pubic symphysis without evidence of widening. The sacroiliac joints are congruent. Age related acetabular spurring. Both femoral heads are well-seated in the respective acetabula. IMPRESSION: No pelvic fracture. Electronically Signed   By: Keith Rake M.D.   On: 08/10/2018 20:24   Dg Chest Port 1 View  Result Date: 08/10/2018 CLINICAL DATA:  Right lower chest pain after fall. EXAM: PORTABLE CHEST 1 VIEW COMPARISON:  03/23/2016 FINDINGS: Cardiomediastinal silhouette is mildly enlarged. Calcific atherosclerotic disease of the aorta. There is no evidence of focal airspace consolidation, pleural effusion or pneumothorax. Displaced right-sided rib fracture, likely lateral tenth rib. Soft tissues are grossly normal. IMPRESSION: 1. Displaced right-sided rib fracture, likely lateral tenth rib. 2. No evidence of pneumothorax. Electronically Signed   By: Fidela Salisbury M.D.   On: 08/10/2018 20:24    EKG: Not performed.   Assessment/Plan   1. Fall with multiple rib fractures; L1 transverse process fracture   - Presents with right flank pain after a mechanical fall without hitting head or losing consciousness  - She is found to have fractures of ribs 10-12 on the right, as well as L1 transverse process fracture  - Surgery is consulting and much appreciated  - Continue pain-control, incentive spirometry, PT and OT consultation   2. Gross hematuria  - Patient developed gross hematuria after the fall  - There is no injury to the kidneys, ureter, or bladder identified on CT  - Patient denies difficulty voiding  - Monitor, bladder scan as needed, consult with urology if fails to resolve  3. Factor V deficiency; history DVT: chronic  anticoagulation  - No evidence for acute VTE, INR is 3.0 on admission  - Hold anticoagulation, monitor INR   4. Thrombocytopenia  - Platelets chronically low, 119,000 on admission  - She reports some gross hematuria after the fall, no other bleeding identified  - Type and screen, monitor H&H   5. Hypertension  - BP elevated in ED in setting of pain  - Continue pain-control, losartan, and Lopressor   6. Hypothyroidism  - Continue Synthroid    7. Chronic diastolic CHF  - Appears compensated  - Gentle IVF hydration while NPO, follow daily wt and I/O's     PPE: Mask, face shield. Patient wearing mask.  DVT prophylaxis: SCD's, warfarin pta  Code Status: Full  Family Communication: Discussed with patient  Consults called: Surgery  Admission status: Observation     Vianne Bulls, MD [facility] Pager [phone]  If 7PM-7AM, please contact night-coverage [email or web address] Password [facility]  08/10/2018, 10:55 PM

## 2018-08-10 NOTE — ED Notes (Signed)
Pt ambulated to BR with a steady gait and minimal assist.

## 2018-08-10 NOTE — ED Triage Notes (Signed)
Pt BIB EMS from church. Pt lives with husband. Pt tripped and hit pew, pt did not fall down. Pt denies LOC. Pt denies hitting head. Pt states pain on R side and back.   173/103 BP  98% RA

## 2018-08-10 NOTE — ED Provider Notes (Signed)
[facility] DEPT Provider Note   CSN: [id number] Arrival date & time: 08/10/18  1844    History   Chief Complaint Chief Complaint  Patient presents with   Flank Pain    HPI Robin Harrell is a 85 y.o. female.     Robin Harrell is a 85 y.o. female with a history of hypertension, PE and DVT currently on Coumadin, factor V Leiden, hyperlipidemia, who presents to the emergency department via EMS for evaluation after fall.  Patient reports she got tripped up at church and started to fall, person next to her caught her on her left side but she hit her right flank and back on the church pew, she reports severe pain in this area.  She did not fall to the ground, denies any head trauma or loss of consciousness.  Reports pain over the right flank and low back is a constant dull ache.  She denies chest pain or shortness of breath.  She has not noticed any bruising, bleeding or abrasions.  She denies pain in her hips.  Able to move her arms and legs without difficulty.  She has not had anything for pain prior to arrival.  Patient is currently on Coumadin which she takes regularly due to clotting disorder.  No other aggravating or alleviating factors.     Past Medical History:  Diagnosis Date   Allergic rhinitis    Cough    Difficulty in swallowing    w/ occasional aspiration   DVT (deep venous thrombosis) (HCC)    Dyslipidemia    Factor V Leiden deficiency    lifelong coumadin   GERD (gastroesophageal reflux disease)    History of nuclear stress test 12/28/2007   lexiscan; low risk    HTN (hypertension)    PND (post-nasal drip)    Pulmonary embolism (HCC)    PVC's (premature ventricular contractions)    Thyroid disease     Patient Active Problem List   Diagnosis Date Noted   Aortic stenosis, mild 02/07/2017   History of recurrent UTIs 01/26/2017   Chronic idiopathic thrombocytopenia ([place]) 04/14/2016   Essential tremor  09/03/2015   History of respiratory failure 11/11/2014   Hypothyroidism (acquired) 09/02/2014   Edema of right lower extremity 02/01/2013   Hyperlipidemia 11/19/2012   Colon polyp 10/15/2012   Long term (current) use of anticoagulants 05/10/2012   Insomnia 10/11/2011   Osteoarthritis, multiple sites 65/99/3570   Diastolic dysfunction 17/79/3903   Factor V deficiency ([place]) 02/09/2010   Osteoporosis 02/09/2010   DJD (degenerative joint disease), lumbar 10/14/2009   Glaucoma 01/29/2008   Essential hypertension 11/15/2007   History of DVT (deep vein thrombosis) 11/15/2007   Seasonal and perennial allergic rhinitis 11/15/2007   GERD 11/15/2007   COUGH, CHRONIC 11/15/2007    Past Surgical History:  Procedure Laterality Date   APPENDECTOMY     BUNIONECTOMY     CATARACT EXTRACTION W/ INTRAOCULAR LENS IMPLANT Right 11/02/2013   CHOLECYSTECTOMY  1973   ESOPHAGEAL DILATION     HAMMER TOE SURGERY     history of sleep study  12/27/2007   AHI during total sleep 0.9/hr and REM 1.5/hr; RDI during total sleep6.0/hr and REM 6.1/hr   REPLACEMENT TOTAL KNEE  01/11/2002   right   TRANSTHORACIC ECHOCARDIOGRAM  12/28/2007   borderline conc LVH with normal systolic function; MV mod thickened with mild MVP & mild MR     OB History   No obstetric history on file.  Home Medications    Prior to Admission medications   Medication Sig Start Date End Date Taking? Authorizing Provider  atorvastatin (LIPITOR) 10 MG tablet Take 1 tablet (10 mg total) by mouth daily at 6 PM. 12/25/17   Lorretta Harp, MD  brimonidine (ALPHAGAN) 0.15 % ophthalmic solution Place 1 drop into both eyes 2 (two) times daily. 05/28/17   [provider]  cetirizine (ZYRTEC) 10 MG tablet TAKE 1 TABLET BY MOUTH EVERY DAY 04/09/18   Collene Gobble, MD  chlorpheniramine (CHLOR-TRIMETON) 4 MG tablet Take by mouth.    [provider]  ipratropium (ATROVENT) 0.03 % nasal spray Place 2  sprays into both nostrils 3 (three) times daily as needed for rhinitis. 03/15/18   Collene Gobble, MD  latanoprost (XALATAN) 0.005 % ophthalmic solution Place 1 drop into both eyes at bedtime.  10/08/10   [provider]  levothyroxine (SYNTHROID, LEVOTHROID) 25 MCG tablet TAKE 1 TABLET BY MOUTH EVERY DAY 05/03/18   [name], MD  losartan (COZAAR) 50 MG tablet TAKE 1 TABLET BY MOUTH EVERY DAY 03/26/18   [name], MD  metoprolol tartrate (LOPRESSOR) 25 MG tablet TAKE 1 TABLET (25 MG TOTAL) BY MOUTH 2 (TWO) TIMES DAILY. KEEP OV. 11/09/17   Lorretta Harp, MD  multivitamin-iron-minerals-folic acid (CENTRUM) chewable tablet Chew 1 tablet by mouth daily.    [provider]  vitamin E (VITAMIN E) 1000 UNIT capsule Take 1,000 Units by mouth daily.    [provider]  warfarin (COUMADIN) 7.5 MG tablet Take 1/2 to 1 tablet by mouth daily as directed 04/03/18   Lorretta Harp, MD    Family History Family History  Problem Relation Age of Onset   Lung cancer Father    Cirrhosis Father    Coronary artery disease Maternal Grandfather        MI   Throat cancer Maternal Grandfather    Heart disease Paternal Grandmother    Coronary artery disease Paternal Grandfather        MI   Throat cancer Paternal Grandfather    Allergic rhinitis Sister    Skin cancer Brother    Breast cancer Daughter 38    Social History Social History   Tobacco Use   Smoking status: Former Smoker    Years: 1.00    Types: Cigarettes    Quit date: 02/21/1958    Years since quitting: 60.5   Smokeless tobacco: Never Used  Substance Use Topics   Alcohol use: Yes    Comment: rareley one glass of wine   Drug use: No     Allergies   Codeine and Erythromycin   Review of Systems Review of Systems  Constitutional: Negative for chills and fever.  HENT: Negative.   Eyes: Negative for visual disturbance.  Respiratory: Negative for cough and shortness of breath.     Cardiovascular: Positive for chest pain (R chest wall). Negative for palpitations and leg swelling.  Gastrointestinal: Positive for abdominal pain (R sided). Negative for diarrhea, nausea and vomiting.  Genitourinary: Positive for flank pain and hematuria. Negative for dysuria.  Musculoskeletal: Positive for back pain.  Skin: Negative for color change, rash and wound.  Neurological: Negative for dizziness, syncope, weakness, light-headedness, numbness and headaches.     Physical Exam Updated Vital Signs BP (!) 181/89    Pulse 94    Temp 98.1 F (36.7 C) (Oral)    Resp 14    LMP  (LMP Unknown)  SpO2 96%   Physical Exam Vitals signs and nursing note reviewed.  Constitutional:      General: She is not in acute distress.    Appearance: Normal appearance. She is well-developed and normal weight. She is not ill-appearing or diaphoretic.  HENT:     Head: Normocephalic and atraumatic.     Comments: Scalp without signs of trauma, no palpable hematoma, no step-off, negative battle sign, no evidence of hemotympanum or CSF otorrhea     Nose: Nose normal.     Mouth/Throat:     Mouth: Mucous membranes are moist.     Pharynx: Oropharynx is clear.  Eyes:     General:        Right eye: No discharge.        Left eye: No discharge.     Extraocular Movements: Extraocular movements intact.     Conjunctiva/sclera: Conjunctivae normal.     Pupils: Pupils are equal, round, and reactive to light.  Neck:     Musculoskeletal: Neck supple.     Comments: C-spine nontender to palpation at midline or paraspinally, normal range of motion in all directions.  No seatbelt sign, no palpable deformity or crepitus Cardiovascular:     Rate and Rhythm: Normal rate and regular rhythm.     Heart sounds: Normal heart sounds. No murmur. No friction rub. No gallop.   Pulmonary:     Effort: Pulmonary effort is normal. No respiratory distress.     Breath sounds: Normal breath sounds. No wheezing or rales.      Comments: Patient breathing comfortably on room air, lung sounds are clear throughout and equal bilaterally.  There is tenderness over the right lower ribs with ecchymosis concerning for rib fracture. Chest:     Chest wall: Tenderness present.  Abdominal:     General: Bowel sounds are normal. There is no distension.     Palpations: Abdomen is soft. There is no mass.     Tenderness: There is abdominal tenderness. There is no guarding.     Comments: Right abdomen and flank tender to palpation with ecchymosis and hematoma noted, abdomen is soft without peritoneal signs.  Bowel sounds present throughout.  Musculoskeletal:        General: No deformity.     Comments: T-spine and L-spine nontender to palpation at midline. Patient moves all extremities without difficulty. All joints supple and easily movable, no erythema, swelling or palpable deformity, all compartments soft.  Skin:    General: Skin is warm and dry.     Capillary Refill: Capillary refill takes less than 2 seconds.     Findings: Bruising present.       Neurological:     Mental Status: She is alert and oriented to person, place, and time.     Coordination: Coordination normal.     Comments: Speech is clear, able to follow commands CN III-XII intact Normal strength in upper and lower extremities bilaterally including dorsiflexion and plantar flexion, strong and equal grip strength Sensation normal to light and sharp touch Moves extremities without ataxia, coordination intact   Psychiatric:        Mood and Affect: Mood normal.        Behavior: Behavior normal.      ED Treatments / Results  Labs (all labs ordered are listed, but only abnormal results are displayed) Labs Reviewed  COMPREHENSIVE METABOLIC PANEL - Abnormal; Notable for the following components:      Result Value   Glucose, Bld 116 (*)  GFR calc non Af Amer 58 (*)    All other components within normal limits  CBC WITH DIFFERENTIAL/PLATELET - Abnormal;  Notable for the following components:   Platelets 119 (*)    All other components within normal limits  URINALYSIS, ROUTINE W REFLEX MICROSCOPIC - Abnormal; Notable for the following components:   APPearance CLOUDY (*)    Hgb urine dipstick LARGE (*)    Protein, ur 100 (*)    RBC / HPF >50 (*)    All other components within normal limits  PROTIME-INR - Abnormal; Notable for the following components:   Prothrombin Time 30.8 (*)    INR 3.0 (*)    All other components within normal limits  APTT - Abnormal; Notable for the following components:   aPTT 47 (*)    All other components within normal limits  SARS CORONAVIRUS 2 (HOSPITAL ORDER, [facility])  LIPASE, BLOOD  CBC  TYPE AND SCREEN  ABO/RH    EKG None  Radiology Ct Head Wo Contrast  Result Date: 08/10/2018 CLINICAL DATA:  Tripped and hit pew at church EXAM: CT HEAD WITHOUT CONTRAST CT CERVICAL SPINE WITHOUT CONTRAST TECHNIQUE: Multidetector CT imaging of the head and cervical spine was performed following the standard protocol without intravenous contrast. Multiplanar CT image reconstructions of the cervical spine were also generated. COMPARISON:  None. FINDINGS: CT HEAD FINDINGS Brain: No acute territorial infarction, hemorrhage or intracranial mass. Mild to moderate atrophy. Moderate small vessel ischemic changes of the white matter. Prominent ventricles felt secondary to atrophy. Vascular: No hyperdense vessels.  Carotid vascular calcification. Skull: Normal. Negative for fracture or focal lesion. Sinuses/Orbits: No acute finding. Other: None CT CERVICAL SPINE FINDINGS Alignment: No subluxation.  Facet alignment is within normal limits. Skull base and vertebrae: No acute fracture. No primary bone lesion or focal pathologic process. Soft tissues and spinal canal: No prevertebral fluid or swelling. No visible canal hematoma. Disc levels: Moderate-to-marked diffuse degenerative changes C3 through C7 with mild  degenerative changes at C2-C3. Multiple level bilateral facet degenerative change with multiple level bilateral foraminal stenosis. Upper chest: Apical scarring.  Carotid vascular calcification. Other: None IMPRESSION: 1. No CT evidence for acute intracranial abnormality. Atrophy with small vessel ischemic changes of the white matter 2. Straightening of the cervical spine with degenerative changes. No acute osseous abnormality. Electronically Signed   By: Donavan Foil M.D.   On: 08/10/2018 22:12   Ct Chest W Contrast  Result Date: 08/10/2018 CLINICAL DATA:  Fall at church with right rib pain. Abd trauma, blunt, stable; Chest trauma, blunt, low energy, neg xray, normal exam and mental status EXAM: CT CHEST, ABDOMEN, AND PELVIS WITH CONTRAST TECHNIQUE: Multidetector CT imaging of the chest, abdomen and pelvis was performed following the standard protocol during bolus administration of intravenous contrast. CONTRAST:  179mL OMNIPAQUE IOHEXOL 300 MG/ML  SOLN COMPARISON:  Chest and pelvic radiographs earlier this day FINDINGS: CT CHEST FINDINGS Cardiovascular: No acute vascular injury. Aortic atherosclerosis and tortuosity. Coronary artery calcifications. No pericardial fluid. Mediastinum/Nodes: No mediastinal hematoma or hemorrhage. Patulous esophagus. Normal thyroid gland. Small mediastinal nodes not enlarged by size criteria. Lungs/Pleura: No pneumothorax. No evidence of pulmonary contusion. Streaky opacities in the dependent lower lobes, favoring atelectasis. Biapical pleuroparenchymal scarring. No significant pleural fluid. Musculoskeletal: Right rib fractures of ribs 10 through 12. The tenth and eleventh rib fractures are segmental and fractured both anterior and posteriorly. Twelfth rib fracture is displaced. Associated subcutaneous soft tissue contusion in the posterior right flank.  No fracture of the sternum, left ribs, included clavicles or shoulder girdles. Multilevel degenerative change in the thoracic  spine. No fracture of the thoracic spine. CT ABDOMEN PELVIS FINDINGS Hepatobiliary: No hepatic injury or perihepatic hematoma. Punctate hepatic granuloma. Gallbladder is absent. Pancreas: No ductal dilatation or inflammation. Spleen: No splenic injury or perisplenic hematoma. 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 Adrenals/Urinary Tract: No adrenal hemorrhage or renal injury identified. Homogeneous renal enhancement with symmetric excretion on delayed phase imaging. Bladder is unremarkable. Stomach/Bowel: Bowel evaluation limited by a lack of contrast, mild motion artifact, and paucity of intra-abdominal fat. No evidence of bowel injury. No bowel wall thickening or inflammation. No mesenteric hematoma. Colonic diverticulosis without diverticulitis. Prior appendectomy per history. No free air. Vascular/Lymphatic: No vascular injury. Aortic atherosclerosis without aneurysm. No retroperitoneal fluid. Slight prominence of the left ovarian vein. 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 No bulky abdominopelvic adenopathy. Reproductive: Uterus and bilateral adnexa are unremarkable. Other: No free air or free fluid. Musculoskeletal: Subcutaneous soft tissue edema in the posterior right flank soft tissues. Slight large mint of right paraspinal musculature likely intramuscular edema. Nondisplaced right transverse process fracture of L1. No additional fracture of the lumbar spine. No pelvic fracture. Multilevel lumbar degenerative change in scoliosis. Pubic symphyseal degenerative change. IMPRESSION: 1. Right rib fractures 10 through 12. The tenth and eleventh rib fractures are segmental. Twelfth rib fracture is displaced. Associated subcutaneous contusion in the posterior right flank. No pneumothorax. 2. Nondisplaced right L1 transverse process fracture. 3. No additional acute traumatic injury to  the chest, abdomen, or pelvis. 4. Aortic Atherosclerosis (ICD10-I70.0). Coronary artery calcifications. Electronically Signed   By: Keith Rake M.D.   On: 08/10/2018 22:14   Ct Cervical Spine Wo Contrast  Result Date: 08/10/2018 CLINICAL DATA:  Tripped and hit pew at church EXAM: CT HEAD WITHOUT CONTRAST CT CERVICAL SPINE WITHOUT CONTRAST TECHNIQUE: Multidetector CT imaging of the head and cervical spine was performed following the standard protocol without intravenous contrast. Multiplanar CT image reconstructions of the cervical spine were also generated. COMPARISON:  None. FINDINGS: CT HEAD FINDINGS Brain: No acute territorial infarction, hemorrhage or intracranial mass. Mild to moderate atrophy. Moderate small vessel ischemic changes of the white matter. Prominent ventricles felt secondary to atrophy. Vascular: No hyperdense vessels.  Carotid vascular calcification. Skull: Normal. Negative for fracture or focal lesion. Sinuses/Orbits: No acute finding. Other: None CT CERVICAL SPINE FINDINGS Alignment: No subluxation.  Facet alignment is within normal limits. Skull base and vertebrae: No acute fracture. No primary bone lesion or focal pathologic process. Soft tissues and spinal canal: No prevertebral fluid or swelling. No visible canal hematoma. Disc levels: Moderate-to-marked diffuse degenerative changes C3 through C7 with mild degenerative changes at C2-C3. Multiple level bilateral facet degenerative change with multiple level bilateral foraminal stenosis. Upper chest: Apical scarring.  Carotid vascular calcification. Other: None IMPRESSION: 1. No CT evidence for acute intracranial abnormality. Atrophy with small vessel ischemic changes of the white matter 2. Straightening of the cervical spine with degenerative changes. No acute osseous abnormality. Electronically Signed   By: Donavan Foil M.D.   On: 08/10/2018 22:12   Ct Abdomen Pelvis W Contrast  Result Date: 08/10/2018 CLINICAL DATA:  Fall at  church with right rib pain. Abd trauma, blunt, stable; Chest trauma, blunt, low energy, neg xray, normal exam and mental status EXAM: CT CHEST, ABDOMEN, AND PELVIS WITH CONTRAST TECHNIQUE: Multidetector CT  imaging of the chest, abdomen and pelvis was performed following the standard protocol during bolus administration of intravenous contrast. CONTRAST:  144mL OMNIPAQUE IOHEXOL 300 MG/ML  SOLN COMPARISON:  Chest and pelvic radiographs earlier this day FINDINGS: CT CHEST FINDINGS Cardiovascular: No acute vascular injury. Aortic atherosclerosis and tortuosity. Coronary artery calcifications. No pericardial fluid. Mediastinum/Nodes: No mediastinal hematoma or hemorrhage. Patulous esophagus. Normal thyroid gland. Small mediastinal nodes not enlarged by size criteria. Lungs/Pleura: No pneumothorax. No evidence of pulmonary contusion. Streaky opacities in the dependent lower lobes, favoring atelectasis. Biapical pleuroparenchymal scarring. No significant pleural fluid. Musculoskeletal: Right rib fractures of ribs 10 through 12. The tenth and eleventh rib fractures are segmental and fractured both anterior and posteriorly. Twelfth rib fracture is displaced. Associated subcutaneous soft tissue contusion in the posterior right flank. No fracture of the sternum, left ribs, included clavicles or shoulder girdles. Multilevel degenerative change in the thoracic spine. No fracture of the thoracic spine. CT ABDOMEN PELVIS FINDINGS Hepatobiliary: No hepatic injury or perihepatic hematoma. Punctate hepatic granuloma. Gallbladder is absent. Pancreas: No ductal dilatation or inflammation. Spleen: No splenic injury or perisplenic hematoma. 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 Adrenals/Urinary Tract: No adrenal hemorrhage or renal injury identified. Homogeneous renal enhancement with symmetric excretion on delayed phase imaging. Bladder is unremarkable. Stomach/Bowel: Bowel  evaluation limited by a lack of contrast, mild motion artifact, and paucity of intra-abdominal fat. No evidence of bowel injury. No bowel wall thickening or inflammation. No mesenteric hematoma. Colonic diverticulosis without diverticulitis. Prior appendectomy per history. No free air. Vascular/Lymphatic: No vascular injury. Aortic atherosclerosis without aneurysm. No retroperitoneal fluid. Slight prominence of the left ovarian vein. 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 No bulky abdominopelvic adenopathy. Reproductive: Uterus and bilateral adnexa are unremarkable. Other: No free air or free fluid. Musculoskeletal: Subcutaneous soft tissue edema in the posterior right flank soft tissues. Slight large mint of right paraspinal musculature likely intramuscular edema. Nondisplaced right transverse process fracture of L1. No additional fracture of the lumbar spine. No pelvic fracture. Multilevel lumbar degenerative change in scoliosis. Pubic symphyseal degenerative change. IMPRESSION: 1. Right rib fractures 10 through 12. The tenth and eleventh rib fractures are segmental. Twelfth rib fracture is displaced. Associated subcutaneous contusion in the posterior right flank. No pneumothorax. 2. Nondisplaced right L1 transverse process fracture. 3. No additional acute traumatic injury to the chest, abdomen, or pelvis. 4. Aortic Atherosclerosis (ICD10-I70.0). Coronary artery calcifications. Electronically Signed   By: Keith Rake M.D.   On: 08/10/2018 22:14   Dg Pelvis Portable  Result Date: 08/10/2018 CLINICAL DATA:  Post fall with right back pain. Tripped at church striking a pew. EXAM: PORTABLE PELVIS 1-2 VIEWS COMPARISON:  None. FINDINGS: The cortical margins of the bony pelvis are intact. No fracture. Chronic changes at the pubic symphysis without evidence of widening. The sacroiliac joints are congruent. Age related acetabular spurring.  Both femoral heads are well-seated in the respective acetabula. IMPRESSION: No pelvic fracture. Electronically Signed   By: Keith Rake M.D.   On: 08/10/2018 20:24   Dg Chest Port 1 View  Result Date: 08/10/2018 CLINICAL DATA:  Right lower chest pain after fall. EXAM: PORTABLE CHEST 1 VIEW COMPARISON:  03/23/2016 FINDINGS: Cardiomediastinal silhouette is mildly enlarged. Calcific atherosclerotic disease of the aorta. There is no evidence of focal airspace consolidation, pleural effusion or pneumothorax. Displaced right-sided rib fracture, likely lateral tenth rib. Soft tissues are grossly normal.  IMPRESSION: 1. Displaced right-sided rib fracture, likely lateral tenth rib. 2. No evidence of pneumothorax. Electronically Signed   By: Fidela Salisbury M.D.   On: 08/10/2018 20:24    Procedures .Critical Care Performed by: Jacqlyn Larsen, [name] Authorized by: Jacqlyn Larsen, [name]   Critical care provider statement:    Critical care time (minutes):  45   Critical care was necessary to treat or prevent imminent or life-threatening deterioration of the following conditions:  Trauma   Critical care was time spent personally by me on the following activities:  Discussions with consultants, evaluation of patient's response to treatment, examination of patient, ordering and performing treatments and interventions, ordering and review of laboratory studies, ordering and review of radiographic studies, pulse oximetry, re-evaluation of patient's condition, obtaining history from patient or surrogate and review of old charts   (including critical care time)  Medications Ordered in ED Medications  sodium chloride (PF) 0.9 % injection (has no administration in time range)  acetaminophen (TYLENOL) tablet 1,000 mg (has no administration in time range)  gabapentin (NEURONTIN) capsule 300 mg (has no administration in time range)  fentaNYL (SUBLIMAZE) injection 25-50 mcg (has no administration in time range)    traMADol (ULTRAM) tablet 50-100 mg (has no administration in time range)  sodium chloride 0.9 % bolus 1,000 mL (has no administration in time range)  fentaNYL (SUBLIMAZE) injection 25 mcg (has no administration in time range)  fentaNYL (SUBLIMAZE) injection 25 mcg (25 mcg Intravenous Given 08/10/18 2013)  iohexol (OMNIPAQUE) 300 MG/ML solution 100 mL (100 mLs Intravenous Contrast Given 08/10/18 2134)  ondansetron (ZOFRAN) injection 4 mg (4 mg Intravenous Given 08/10/18 2216)     Initial Impression / Assessment and Plan / ED Course  I have reviewed the triage vital signs and the nursing notes.  Pertinent labs & imaging results that were available during my care of the patient were reviewed by me and considered in my medical decision making (see chart for details).  85 year old female presents after fall with right flank landing on church pew, did not fall to the ground, did not hit her head.  On arrival patient is hypertensive, all other vitals normal.  I was notified by nursing staff that patient was taken to the bathroom and had gross hematuria which is reportedly new since fall.  On exam she has ecchymosis with palpable hematoma over the right flank and right lower ribs.  Breath sounds are present and she is not hypoxic or tachypneic, no signs of pneumothorax, but I am very concerned for rib fractures and possible renal injury given hematuria.  Patient is on Coumadin.  Will get basic labs, coags, urinalysis, type and screen, portable chest and pelvis x-ray and CTs of the abdomen pelvis.  Discussed with Dr. Melina Copa who recommends adding on CT head and C-spine.  Fentanyl given for pain.  Chest x-ray shows probable displaced 10th rib fracture with no evidence of pneumothorax or pleural effusion.  No evidence of pelvic fracture.  Labs show hemoglobin of 12.6, no leukocytosis, no acute electrolyte derangements, normal renal function.  INR is elevated at 3, APTT slightly elevated as well.  Urinalysis  shows greater than 50 RBCs, but no signs of infection.  CT scan shows fractures of the right 10th, 11th and 12th ribs.  10th and 11th ribs are segmented with both anterior and posterior fractures.  There is no evidence of pneumothorax but there is a subcutaneous right flank hematoma.  CT abdomen and pelvis is overall reassuring with  no evidence of renal injury, liver or spleen injury.  There is an acute transverse process fracture at L1 but no other acute findings.  On reassessment patient reports pain is returning.  When I entered the room she was satting in the low 80s and appears to be splinting due to pain, patient placed on 2 L nasal cannula and additional pain medication given.  Case was discussed with Dr. Johney Maine for general surgery who recommends that the patient be admitted to medicine service with trauma service consulting at [facility].  Patient will need to be transferred.  He will contact on-call trauma surgeon at [facility].  Case discussed with Dr. Myna Hidalgo with [facility] hospitalist who will admit to medicine service.  Final Clinical Impressions(s) / ED Diagnoses   Final diagnoses:  Closed fracture of multiple ribs of right side, initial encounter  Contusion of flank, initial encounter  Gross hematuria  Fall, initial encounter  Anticoagulated on warfarin    ED Discharge Orders    None       Janet Berlin 08/11/18 0113    Hayden Rasmussen, MD 08/11/18 0930

## 2018-08-10 NOTE — ED Notes (Signed)
Pt found after using BR with blood and clots in urine, and states this is new. Pt assisted back to bed and [place], PA at bedside with pt.

## 2018-08-11 ENCOUNTER — Observation Stay (HOSPITAL_COMMUNITY): Payer: Medicare Other

## 2018-08-11 DIAGNOSIS — R310 Gross hematuria: Secondary | ICD-10-CM | POA: Diagnosis present

## 2018-08-11 DIAGNOSIS — I10 Essential (primary) hypertension: Secondary | ICD-10-CM | POA: Diagnosis not present

## 2018-08-11 DIAGNOSIS — E785 Hyperlipidemia, unspecified: Secondary | ICD-10-CM | POA: Diagnosis present

## 2018-08-11 DIAGNOSIS — D693 Immune thrombocytopenic purpura: Secondary | ICD-10-CM | POA: Diagnosis present

## 2018-08-11 DIAGNOSIS — D62 Acute posthemorrhagic anemia: Secondary | ICD-10-CM | POA: Diagnosis not present

## 2018-08-11 DIAGNOSIS — Z20828 Contact with and (suspected) exposure to other viral communicable diseases: Secondary | ICD-10-CM | POA: Diagnosis present

## 2018-08-11 DIAGNOSIS — M47816 Spondylosis without myelopathy or radiculopathy, lumbar region: Secondary | ICD-10-CM | POA: Diagnosis present

## 2018-08-11 DIAGNOSIS — K219 Gastro-esophageal reflux disease without esophagitis: Secondary | ICD-10-CM | POA: Diagnosis not present

## 2018-08-11 DIAGNOSIS — S32018A Other fracture of first lumbar vertebra, initial encounter for closed fracture: Secondary | ICD-10-CM | POA: Diagnosis present

## 2018-08-11 DIAGNOSIS — E039 Hypothyroidism, unspecified: Secondary | ICD-10-CM | POA: Diagnosis present

## 2018-08-11 DIAGNOSIS — M159 Polyosteoarthritis, unspecified: Secondary | ICD-10-CM | POA: Diagnosis not present

## 2018-08-11 DIAGNOSIS — N390 Urinary tract infection, site not specified: Secondary | ICD-10-CM | POA: Diagnosis not present

## 2018-08-11 DIAGNOSIS — I5189 Other ill-defined heart diseases: Secondary | ICD-10-CM | POA: Diagnosis not present

## 2018-08-11 DIAGNOSIS — Z87440 Personal history of urinary (tract) infections: Secondary | ICD-10-CM | POA: Diagnosis not present

## 2018-08-11 DIAGNOSIS — I110 Hypertensive heart disease with heart failure: Secondary | ICD-10-CM | POA: Diagnosis present

## 2018-08-11 DIAGNOSIS — D682 Hereditary deficiency of other clotting factors: Secondary | ICD-10-CM | POA: Diagnosis not present

## 2018-08-11 DIAGNOSIS — Z86711 Personal history of pulmonary embolism: Secondary | ICD-10-CM | POA: Diagnosis not present

## 2018-08-11 DIAGNOSIS — J302 Other seasonal allergic rhinitis: Secondary | ICD-10-CM | POA: Diagnosis present

## 2018-08-11 DIAGNOSIS — S2241XA Multiple fractures of ribs, right side, initial encounter for closed fracture: Secondary | ICD-10-CM | POA: Diagnosis present

## 2018-08-11 DIAGNOSIS — I5032 Chronic diastolic (congestive) heart failure: Secondary | ICD-10-CM | POA: Diagnosis present

## 2018-08-11 DIAGNOSIS — K5900 Constipation, unspecified: Secondary | ICD-10-CM | POA: Diagnosis not present

## 2018-08-11 DIAGNOSIS — Z86718 Personal history of other venous thrombosis and embolism: Secondary | ICD-10-CM | POA: Diagnosis not present

## 2018-08-11 DIAGNOSIS — I350 Nonrheumatic aortic (valve) stenosis: Secondary | ICD-10-CM | POA: Diagnosis present

## 2018-08-11 DIAGNOSIS — S37011A Minor contusion of right kidney, initial encounter: Secondary | ICD-10-CM | POA: Diagnosis present

## 2018-08-11 DIAGNOSIS — W01190A Fall on same level from slipping, tripping and stumbling with subsequent striking against furniture, initial encounter: Secondary | ICD-10-CM | POA: Diagnosis present

## 2018-08-11 DIAGNOSIS — D6851 Activated protein C resistance: Secondary | ICD-10-CM | POA: Diagnosis present

## 2018-08-11 DIAGNOSIS — S2241XD Multiple fractures of ribs, right side, subsequent encounter for fracture with routine healing: Secondary | ICD-10-CM | POA: Diagnosis not present

## 2018-08-11 DIAGNOSIS — Z79890 Hormone replacement therapy (postmenopausal): Secondary | ICD-10-CM | POA: Diagnosis not present

## 2018-08-11 DIAGNOSIS — H409 Unspecified glaucoma: Secondary | ICD-10-CM | POA: Diagnosis present

## 2018-08-11 DIAGNOSIS — R109 Unspecified abdominal pain: Secondary | ICD-10-CM | POA: Diagnosis not present

## 2018-08-11 DIAGNOSIS — Z7901 Long term (current) use of anticoagulants: Secondary | ICD-10-CM | POA: Diagnosis not present

## 2018-08-11 DIAGNOSIS — Z87891 Personal history of nicotine dependence: Secondary | ICD-10-CM | POA: Diagnosis not present

## 2018-08-11 DIAGNOSIS — R319 Hematuria, unspecified: Secondary | ICD-10-CM | POA: Diagnosis not present

## 2018-08-11 DIAGNOSIS — E7800 Pure hypercholesterolemia, unspecified: Secondary | ICD-10-CM | POA: Diagnosis not present

## 2018-08-11 DIAGNOSIS — Y9222 Religious institution as the place of occurrence of the external cause: Secondary | ICD-10-CM | POA: Diagnosis not present

## 2018-08-11 DIAGNOSIS — Z8709 Personal history of other diseases of the respiratory system: Secondary | ICD-10-CM | POA: Diagnosis not present

## 2018-08-11 DIAGNOSIS — M810 Age-related osteoporosis without current pathological fracture: Secondary | ICD-10-CM | POA: Diagnosis present

## 2018-08-11 DIAGNOSIS — R918 Other nonspecific abnormal finding of lung field: Secondary | ICD-10-CM | POA: Diagnosis not present

## 2018-08-11 DIAGNOSIS — W01119D Fall on same level from slipping, tripping and stumbling with subsequent striking against unspecified sharp object, subsequent encounter: Secondary | ICD-10-CM | POA: Diagnosis not present

## 2018-08-11 DIAGNOSIS — S301XXA Contusion of abdominal wall, initial encounter: Secondary | ICD-10-CM | POA: Diagnosis not present

## 2018-08-11 LAB — TYPE AND SCREEN
ABO/RH(D): B POS
Antibody Screen: NEGATIVE

## 2018-08-11 LAB — BASIC METABOLIC PANEL
Anion gap: 7 (ref 5–15)
BUN: 15 mg/dL (ref 8–23)
CO2: 24 mmol/L (ref 22–32)
Calcium: 8.8 mg/dL — ABNORMAL LOW (ref 8.9–10.3)
Chloride: 105 mmol/L (ref 98–111)
Creatinine, Ser: 0.85 mg/dL (ref 0.44–1.00)
GFR calc Af Amer: >60 mL/min (ref >60)
GFR calc non Af Amer: >60 mL/min (ref >60)
Glucose, Bld: 111 mg/dL — ABNORMAL HIGH (ref 70–99)
Potassium: 4.1 mmol/L (ref 3.5–5.1)
Sodium: 136 mmol/L (ref 135–145)

## 2018-08-11 LAB — CBC
HCT: 36.9 % (ref 36.0–46.0)
HCT: 34.8 % — ABNORMAL LOW (ref 36.0–46.0)
Hemoglobin: 11.8 g/dL — ABNORMAL LOW (ref 12.0–15.0)
Hemoglobin: 11.3 g/dL — ABNORMAL LOW (ref 12.0–15.0)
MCH: 30.7 pg (ref 26.0–34.0)
MCH: 30.5 pg (ref 26.0–34.0)
MCHC: 32 g/dL (ref 30.0–36.0)
MCHC: 32.5 g/dL (ref 30.0–36.0)
MCV: 96.1 fL (ref 80.0–100.0)
MCV: 94.1 fL (ref 80.0–100.0)
Platelets: 114 10*3/uL — ABNORMAL LOW (ref 150–400)
Platelets: 131 10*3/uL — ABNORMAL LOW (ref 150–400)
RBC: 3.84 MIL/uL — ABNORMAL LOW (ref 3.87–5.11)
RBC: 3.7 MIL/uL — ABNORMAL LOW (ref 3.87–5.11)
RDW: 13.2 % (ref 11.5–15.5)
RDW: 13.2 % (ref 11.5–15.5)
WBC: 9.5 10*3/uL (ref 4.0–10.5)
WBC: 8.1 10*3/uL (ref 4.0–10.5)
nRBC: 0 % (ref 0.0–0.2)
nRBC: 0 % (ref 0.0–0.2)

## 2018-08-11 LAB — ABO/RH
ABO/RH(D): B POS
ABO/RH(D): B POS

## 2018-08-11 LAB — GLUCOSE, CAPILLARY: Glucose-Capillary: 101 mg/dL — ABNORMAL HIGH (ref 70–99)

## 2018-08-11 LAB — SARS CORONAVIRUS 2 BY RT PCR (HOSPITAL ORDER, PERFORMED IN ~~LOC~~ HOSPITAL LAB): SARS Coronavirus 2: NEGATIVE

## 2018-08-11 LAB — PROTIME-INR
INR: 3 — ABNORMAL HIGH (ref 0.8–1.2)
Prothrombin Time: 31 seconds — ABNORMAL HIGH (ref 11.4–15.2)

## 2018-08-11 MED ORDER — ONDANSETRON HCL 4 MG/2ML IJ SOLN
4.0000 mg | Freq: Four times a day (QID) | INTRAMUSCULAR | Status: DC | PRN
  Administered 2018-08-13 – 2018-08-16 (×3): 4 mg via INTRAVENOUS
  Filled 2018-08-11 (×3): qty 2

## 2018-08-11 MED ORDER — ONDANSETRON HCL 4 MG PO TABS
4.0000 mg | ORAL_TABLET | Freq: Four times a day (QID) | ORAL | Status: DC | PRN
  Administered 2018-08-18: 4 mg via ORAL
  Filled 2018-08-11 (×2): qty 1

## 2018-08-11 MED ORDER — LATANOPROST 0.005 % OP SOLN
1.0000 [drp] | Freq: Every day | OPHTHALMIC | Status: DC
  Administered 2018-08-11 – 2018-08-17 (×7): 1 [drp] via OPHTHALMIC
  Filled 2018-08-11 (×2): qty 2.5

## 2018-08-11 MED ORDER — SODIUM CHLORIDE 0.45 % IV SOLN
INTRAVENOUS | Status: DC
  Administered 2018-08-11: 04:00:00 via INTRAVENOUS

## 2018-08-11 MED ORDER — LEVOTHYROXINE SODIUM 25 MCG PO TABS
25.0000 ug | ORAL_TABLET | Freq: Every day | ORAL | Status: DC
  Administered 2018-08-11 – 2018-08-18 (×8): 25 ug via ORAL
  Filled 2018-08-11 (×8): qty 1

## 2018-08-11 MED ORDER — ATORVASTATIN CALCIUM 10 MG PO TABS
10.0000 mg | ORAL_TABLET | Freq: Every day | ORAL | Status: DC
  Administered 2018-08-11 – 2018-08-17 (×7): 10 mg via ORAL
  Filled 2018-08-11 (×7): qty 1

## 2018-08-11 MED ORDER — METOPROLOL TARTRATE 25 MG PO TABS
25.0000 mg | ORAL_TABLET | Freq: Two times a day (BID) | ORAL | Status: DC
  Administered 2018-08-11 – 2018-08-18 (×16): 25 mg via ORAL
  Filled 2018-08-11 (×16): qty 1

## 2018-08-11 MED ORDER — POLYETHYLENE GLYCOL 3350 17 G PO PACK: 17.0000 g | PACK | Freq: Every day | ORAL | Status: DC | PRN

## 2018-08-11 MED ORDER — BRIMONIDINE TARTRATE 0.15 % OP SOLN
1.0000 [drp] | Freq: Two times a day (BID) | OPHTHALMIC | Status: DC
  Administered 2018-08-11 – 2018-08-18 (×15): 1 [drp] via OPHTHALMIC
  Filled 2018-08-11: qty 5

## 2018-08-11 MED ORDER — LOSARTAN POTASSIUM 50 MG PO TABS
50.0000 mg | ORAL_TABLET | Freq: Every day | ORAL | Status: DC
  Administered 2018-08-11 – 2018-08-18 (×8): 50 mg via ORAL
  Filled 2018-08-11 (×8): qty 1

## 2018-08-11 MED ORDER — BISACODYL 5 MG PO TBEC: 5.0000 mg | DELAYED_RELEASE_TABLET | Freq: Every day | ORAL | Status: DC | PRN

## 2018-08-11 NOTE — TOC Initial Note (Signed)
Transition of Care [facility]) - Initial/Assessment Note    Patient Details  Name: Robin Harrell MRN: [id number] Date of Birth: 1933/12/03  Transition of Care [facility]) CM/SW Contact:    Alexander Mt, [place] Phone Number: 08/11/2018, 12:36 PM  Clinical Narrative:                 Spoke with pt on phone, introduced self, role, reason for call. Pt amenable to speaking with CSW. Pt from home, has a spouse and adult daughter that lives in [place], near [place]. Pt confirmed demographics and PCP. Pt fell at church, SBIRT complete, denies any ETOH or other substance use.   Pt feels she has support at home and she currently is declining SNF, RNCM aware and will f/u with choice.   Expected Discharge Plan: [place] Barriers to Discharge: Continued Medical Work up   Patient Goals and CMS Choice Patient states their goals for this hospitalization and ongoing recovery are:: to go home   Choice offered to / list presented to : Patient  Expected Discharge Plan and Services Expected Discharge Plan: [place] In-house Referral: Clinical Social Work Discharge Planning Services: CM Consult Post Acute Care Choice: Home Health, Durable Medical Equipment Living arrangements for the past 2 months: Single Family Home                                      Prior Living Arrangements/Services Living arrangements for the past 2 months: Single Family Home Lives with:: Spouse Patient language and need for interpreter reviewed:: Yes(no needs) Do you feel safe going back to the place where you live?: Yes      Need for Family Participation in Patient Care: Yes (Comment)(assistance with ADLs and IADLS) Care giver support system in place?: Yes (comment)(pt spouse; children; neighbor) Current home services: DME Criminal Activity/Legal Involvement Pertinent to Current Situation/Hospitalization: No - Comment as needed  Activities of Daily Living      Permission  Sought/Granted Permission sought to share information with : Family Supports Permission granted to share information with : Yes, Verbal Permission Granted  Share Information with NAME: Robin Harrell  Permission granted to share info w AGENCY: [place]  Permission granted to share info w Relationship: spouse  Permission granted to share info w Contact Information: [phone]  Emotional Assessment Appearance:: Other (Comment Required(spoke with pt on phone) Attitude/Demeanor/Rapport: (spoke with pt on phone) Affect (typically observed): (spoke with pt on phone) Orientation: : Oriented to Self, Oriented to Place, Oriented to  Time, Oriented to Situation Alcohol / Substance Use: Not Applicable Psych Involvement: No (comment)  Admission diagnosis:  Gross hematuria [R31.0] Anticoagulated on warfarin [Z79.01] Fall, initial encounter [W19.XXXA] Contusion of flank, initial encounter [S30.1XXA] Closed fracture of multiple ribs of right side, initial encounter [S22.41XA] Patient Active Problem List   Diagnosis Date Noted  . Multiple rib fractures, right 10-12 08/10/2018  . Gross hematuria 08/10/2018  . Traumatic hematoma of flank 08/10/2018  . Fall against sharp object 08/10/2018  . Anticoagulated on warfarin   . Chronic diastolic CHF (congestive heart failure) ([place])   . Aortic stenosis, mild 02/07/2017  . History of recurrent UTIs 01/26/2017  . Chronic idiopathic thrombocytopenia ([place]) 04/14/2016  . Essential tremor 09/03/2015  . History of respiratory failure 11/11/2014  . Hypothyroidism (acquired) 09/02/2014  . Edema of right lower extremity 02/01/2013  . Hyperlipidemia 11/19/2012  . Colon polyp  10/15/2012  . Long term (current) use of anticoagulants 05/10/2012  . Insomnia 10/11/2011  . Osteoarthritis, multiple sites 06/30/2011  . Diastolic dysfunction 23/95/3202  . Factor V deficiency ([place]) 02/09/2010  . Osteoporosis 02/09/2010  . DJD (degenerative joint disease), lumbar  10/14/2009  . Glaucoma 01/29/2008  . Essential hypertension 11/15/2007  . History of DVT (deep vein thrombosis) 11/15/2007  . Seasonal and perennial allergic rhinitis 11/15/2007  . GERD 11/15/2007  . COUGH, CHRONIC 11/15/2007   PCP:  [name], MD Pharmacy:   [place], [place] - [address] [place] [address] Phone: [phone] Fax: [phone]  [id number] - [place], [place] - [address] [place] [address] Phone: [phone] Fax: [phone]  [id number] - [place], [place] [address] [place] [address] Phone: [phone] Fax: [phone]     Social Determinants of Health (SDOH) Interventions    Readmission Risk Interventions No flowsheet data found.

## 2018-08-11 NOTE — ED Notes (Addendum)
[facility] called for transport. 

## 2018-08-11 NOTE — Progress Notes (Signed)
Urine coming out from purewick to canister is still red.  Pt AOx4, VSS and pain at 2/10 when not moving.  Will endorse to day shift RN accordingly.

## 2018-08-11 NOTE — Evaluation (Signed)
Physical Therapy Evaluation Patient Details Name: Robin Harrell MRN: [id number] DOB: 05-21-1933 Today's Date: 08/11/2018   History of Present Illness  Pt is a 85 y/o female presenting to ED after fall and R flank pain. Imaging reveals: right rib fractures 10 through 12 and associated subcutaneous contusion, as well as nondisplaced right L1 transverse process fracture.  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     Clinical Impression  Pt admitted with above diagnosis. Pt currently with functional limitations due to the deficits listed below (see PT Problem List). PTA pt lived at home with her husband, independent. On eval, pt required min assist bed mobility, min assist +2 safety transfers, and min assist +2 safety ambulation 35 feet with RW. Pt will benefit from skilled PT to increase their independence and safety with mobility to allow discharge to the venue listed below.  Pt would benefit from additional night stay in acute care prior to d/c home.     Follow Up Recommendations Home health PT;Supervision/Assistance - 24 hour    Equipment Recommendations  Rolling walker with 5" wheels    Recommendations for Other Services       Precautions / Restrictions Precautions Precautions: Fall Restrictions Weight Bearing Restrictions: No      Mobility  Bed Mobility Overal bed mobility: Needs Assistance Bed Mobility: Supine to Sit     Supine to sit: Min assist;HOB elevated     General bed mobility comments: +rail, assist to elevate trunk, increased time and effort  Transfers Overall transfer level: Needs assistance Equipment used: Rolling walker (2 wheeled) Transfers: Sit to/from Stand Sit to Stand: Min assist;+2 safety/equipment         General transfer comment: cues for hand placement, assist to power up  Ambulation/Gait Ambulation/Gait assistance: +2 safety/equipment;Min assist Gait Distance  (Feet): 35 Feet Assistive device: Rolling walker (2 wheeled) Gait Pattern/deviations: Trunk flexed;Decreased stride length;Step-through pattern Gait velocity: decreased Gait velocity interpretation: <1.31 ft/sec, indicative of household ambulator General Gait Details: Pt ambulated on RA with SpO2 95%. Pt with c/o feeling hot, nauseous and dizziness after entering hallway requiring retrieval of recliner. RN notified. VSS  Stairs            Wheelchair Mobility    Modified Rankin (Stroke Patients Only)       Balance Overall balance assessment: Needs assistance Sitting-balance support: No upper extremity supported;Feet supported Sitting balance-Leahy Scale: Fair     Standing balance support: Bilateral upper extremity supported;During functional activity Standing balance-Leahy Scale: Poor Standing balance comment: relaint on B UE support                             Pertinent Vitals/Pain Pain Assessment: 0-10 Pain Score: 8  Pain Location: R ribs Pain Descriptors / Indicators: Discomfort;Grimacing;Guarding Pain Intervention(s): Limited activity within patient's tolerance;Monitored during session;Repositioned    Home Living Family/patient expects to be discharged to:: Private residence Living Arrangements: Spouse/significant other Available Help at Discharge: Family;Available 24 hours/day;Friend(s) Type of Home: House Home Access: Stairs to enter Entrance Stairs-Rails: Left Entrance Stairs-Number of Steps: 1 Home Layout: One level Home Equipment: Walker - 2 wheels;Wheelchair - manual Additional Comments: Her husband is currently using the RW.    Prior Function Level of Independence: Independent         Comments: driving, IADLs      Hand Dominance   Dominant Hand: Right  Extremity/Trunk Assessment   Upper Extremity Assessment Upper Extremity Assessment: Generalized weakness    Lower Extremity Assessment Lower Extremity Assessment:  Generalized weakness    Cervical / Trunk Assessment Cervical / Trunk Assessment: Other exceptions;Kyphotic Cervical / Trunk Exceptions: R rib fxs, and R L1 transverse process fx   Communication   Communication: No difficulties  Cognition Arousal/Alertness: Awake/alert Behavior During Therapy: WFL for tasks assessed/performed Overall Cognitive Status: Within Functional Limits for tasks assessed                                 General Comments: appears [facility]       General Comments General comments (skin integrity, edema, etc.): SpO2 90-95% on RA, HR 76 during amb    Exercises     Assessment/Plan    PT Assessment Patient needs continued PT services  PT Problem List Decreased strength;Decreased mobility;Decreased knowledge of precautions;Decreased activity tolerance;Decreased balance;Decreased knowledge of use of DME;Pain       PT Treatment Interventions DME instruction;Therapeutic activities;Gait training;Therapeutic exercise;Stair training;Balance training;Functional mobility training;Patient/family education    PT Goals (Current goals can be found in the Care Plan section)  Acute Rehab PT Goals Patient Stated Goal: to get back home  PT Goal Formulation: With patient Time For Goal Achievement: 08/25/18 Potential to Achieve Goals: Good    Frequency Min 3X/week   Barriers to discharge        Co-evaluation PT/OT/SLP Co-Evaluation/Treatment: Yes Reason for Co-Treatment: For patient/therapist safety;To address functional/ADL transfers PT goals addressed during session: Mobility/safety with mobility;Balance;Proper use of DME OT goals addressed during session: ADL's and self-care       AM-PAC PT "6 Clicks" Mobility  Outcome Measure Help needed turning from your back to your side while in a flat bed without using bedrails?: A Little Help needed moving from lying on your back to sitting on the side of a flat bed without using bedrails?: A Little Help needed  moving to and from a bed to a chair (including a wheelchair)?: A Little Help needed standing up from a chair using your arms (e.g., wheelchair or bedside chair)?: A Little Help needed to walk in hospital room?: A Little Help needed climbing 3-5 steps with a railing? : A Lot 6 Click Score: 17    End of Session Equipment Utilized During Treatment: Gait belt;Oxygen Activity Tolerance: Patient limited by pain;Patient tolerated treatment well Patient left: in chair;with call bell/phone within reach Nurse Communication: Mobility status PT Visit Diagnosis: Other abnormalities of gait and mobility (R26.89);Unsteadiness on feet (R26.81);Pain;History of falling (Z91.81) Pain - Right/Left: Right    Time: 0762-2633 PT Time Calculation (min) (ACUTE ONLY): 41 min   Charges:   PT Evaluation $PT Eval Moderate Complexity: 1 Mod PT Treatments $Gait Training: 8-22 mins        Lorrin Goodell, PT  Office # [phone] Pager [phone]   Lorriane Shire 08/11/2018, 11:39 AM

## 2018-08-11 NOTE — Progress Notes (Signed)
Progress Note    Robin Harrell  FOY:[id number] DOB: 1934-02-22  DOA: 08/10/2018 PCP: [name], MD    Brief Narrative:     Medical records reviewed and are as summarized below:  Robin Harrell is an 85 y.o. 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  Patient reports that she been in her usual state of health and was having an uneventful day when she was at church this evening and tripped, striking her right side against the corner of a pew.  She did not hit her head or lose consciousness.  Assessment/Plan:   Principal Problem:   Multiple rib fractures, right 10-12 Active Problems:   Essential hypertension   History of DVT (deep vein thrombosis)   GERD   Long term (current) use of anticoagulants   Hyperlipidemia   Chronic idiopathic thrombocytopenia (HCC)   Diastolic dysfunction   Factor V deficiency (HCC)   History of respiratory failure   Hypothyroidism (acquired)   Insomnia   Osteoarthritis, multiple sites   Osteoporosis   History of recurrent UTIs   Aortic stenosis, mild   Gross hematuria   Traumatic hematoma of flank   Fall against sharp object  1. Fall with multiple rib fractures; L1 transverse process fracture   - Presents with right flank pain after a mechanical fall without hitting head or losing consciousness  - She is found to have fractures of ribs 10-12 on the right, as well as L1 transverse process fracture  - Continue pain-control, incentive spirometry -PT: home health with 24/7 supervision -trauma surgery consulted  2. Gross hematuria  - Patient developed gross hematuria after the fall  - There is no injury to the kidneys, ureter, or bladder identified on CT  - Patient denies difficulty voiding  -monitor closely for resolution  3. Factor V  deficiency; history DVT: chronic anticoagulation  - No evidence for acute VTE, INR is 3.0 on admission  - Hold anticoagulation, monitor INR   4. Thrombocytopenia  - Platelets chronically low -trend  5. Hypertension  - BP elevated in ED in setting of pain  - Continue pain-control, losartan, and Lopressor   6. Hypothyroidism  - Continue Synthroid    7. Chronic diastolic CHF  - Appears compensated     Family Communication/Anticipated D/C date and plan/Code Status   DVT prophylaxis: coumadin Code Status: Full Code.  Family Communication: Disposition Plan: needs to be weaned off O2, repeat x ray in AM   Medical Consultants:    trauma  Subjective:   sleepy  Objective:    Vitals:   08/11/18 0030 08/11/18 0157 08/11/18 0500 08/11/18 0558  BP: (!) 144/80 134/82  120/76  Pulse: 94 100  70  Resp: 18 16  15   Temp:  98.1 F (36.7 C)  97.7 F (36.5 C)  TempSrc:  Oral  Oral  SpO2: 98% 97%  97%  Weight:   63.5 kg     Intake/Output Summary (Last 24 hours) at 08/11/2018 1255 Last data filed at 08/11/2018 7672 Gross per 24 hour  Intake 1283 ml  Output 925 ml  Net 358 ml   Filed Weights   08/11/18 0500  Weight: 63.5 kg    Exam: Resting comfortably rrr NAD No increased work of breathing, on 2L  Data Reviewed:   I have personally reviewed following labs  and imaging studies:  Labs: Labs show the following:   Basic Metabolic Panel: Recent Labs  Lab 08/10/18 2006 08/11/18 0356  NA 139 136  K 4.3 4.1  CL 103 105  CO2 25 24  GLUCOSE 116* 111*  BUN 21 15  CREATININE 0.92 0.85  CALCIUM 9.9 8.8*   GFR Estimated Creatinine Clearance: 44.7 mL/min (by C-G formula based on SCr of 0.85 mg/dL). Liver Function Tests: Recent Labs  Lab 08/10/18 2006  AST 32  ALT 21  ALKPHOS 62  BILITOT 0.7  PROT 7.5  ALBUMIN 4.3   Recent Labs  Lab 08/10/18 2006  LIPASE 33   No results for input(s): AMMONIA in the last 168 hours. Coagulation profile Recent  Labs  Lab 08/10/18 2006 08/11/18 0356  INR 3.0* 3.0*    CBC: Recent Labs  Lab 08/10/18 2006 08/11/18 0047 08/11/18 0356  WBC 5.1 9.5 8.1  NEUTROABS 3.4  --   --   HGB 12.6 11.8* 11.3*  HCT 39.3 36.9 34.8*  MCV 96.6 96.1 94.1  PLT 119* 114* 131*   Cardiac Enzymes: No results for input(s): CKTOTAL, CKMB, CKMBINDEX, TROPONINI in the last 168 hours. BNP (last 3 results) No results for input(s): PROBNP in the last 8760 hours. CBG: Recent Labs  Lab 08/11/18 0748  GLUCAP 101*   D-Dimer: No results for input(s): DDIMER in the last 72 hours. Hgb A1c: No results for input(s): HGBA1C in the last 72 hours. Lipid Profile: No results for input(s): CHOL, HDL, LDLCALC, TRIG, CHOLHDL, LDLDIRECT in the last 72 hours. Thyroid function studies: No results for input(s): TSH, T4TOTAL, T3FREE, THYROIDAB in the last 72 hours.  Invalid input(s): FREET3 Anemia work up: No results for input(s): VITAMINB12, FOLATE, FERRITIN, TIBC, IRON, RETICCTPCT in the last 72 hours. Sepsis Labs: Recent Labs  Lab 08/10/18 2006 08/11/18 0047 08/11/18 0356  WBC 5.1 9.5 8.1    Microbiology Recent Results (from the past 240 hour(s))  SARS Coronavirus 2 (CEPHEID - Performed in [facility] hospital lab), Hosp Order     Status: None   Collection Time: 08/10/18 10:33 PM   Specimen: Nasopharyngeal Swab  Result Value Ref Range Status   SARS Coronavirus 2 NEGATIVE NEGATIVE Final    Comment: (NOTE) If result is NEGATIVE SARS-CoV-2 target nucleic acids are NOT DETECTED. The SARS-CoV-2 RNA is generally detectable in upper and lower  respiratory specimens during the acute phase of infection. The lowest  concentration of SARS-CoV-2 viral copies this assay can detect is 250  copies / mL. A negative result does not preclude SARS-CoV-2 infection  and should not be used as the sole basis for treatment or other  patient management decisions.  A negative result may occur with  improper specimen collection /  handling, submission of specimen other  than nasopharyngeal swab, presence of viral mutation(s) within the  areas targeted by this assay, and inadequate number of viral copies  (<250 copies / mL). A negative result must be combined with clinical  observations, patient history, and epidemiological information. If result is POSITIVE SARS-CoV-2 target nucleic acids are DETECTED. The SARS-CoV-2 RNA is generally detectable in upper and lower  respiratory specimens dur ing the acute phase of infection.  Positive  results are indicative of active infection with SARS-CoV-2.  Clinical  correlation with patient history and other diagnostic information is  necessary to determine patient infection status.  Positive results do  not rule out bacterial infection or co-infection with other viruses. If result is PRESUMPTIVE POSTIVE SARS-CoV-2 nucleic  acids MAY BE PRESENT.   A presumptive positive result was obtained on the submitted specimen  and confirmed on repeat testing.  While 2019 novel coronavirus  (SARS-CoV-2) nucleic acids may be present in the submitted sample  additional confirmatory testing may be necessary for epidemiological  and / or clinical management purposes  to differentiate between  SARS-CoV-2 and other Sarbecovirus currently known to infect humans.  If clinically indicated additional testing with an alternate test  methodology [phone]) is advised. The SARS-CoV-2 RNA is generally  detectable in upper and lower respiratory sp ecimens during the acute  phase of infection. The expected result is Negative. Fact Sheet for Patients:  StrictlyIdeas.no Fact Sheet for Healthcare Providers: BankingDealers.co.za This test is not yet approved or cleared by the [place] FDA and has been authorized for detection and/or diagnosis of SARS-CoV-2 by FDA under an Emergency Use Authorization (EUA).  This EUA will remain in effect (meaning this  test can be used) for the duration of the COVID-19 declaration under Section 564(b)(1) of the Act, 21 U.S.C. section 360bbb-3(b)(1), unless the authorization is terminated or revoked sooner. Performed at [facility], [place] [address], [place], [place] [address]     Procedures and diagnostic studies:  Ct Head Wo Contrast  Result Date: 08/10/2018 CLINICAL DATA:  Tripped and hit pew at church EXAM: CT HEAD WITHOUT CONTRAST CT CERVICAL SPINE WITHOUT CONTRAST TECHNIQUE: Multidetector CT imaging of the head and cervical spine was performed following the standard protocol without intravenous contrast. Multiplanar CT image reconstructions of the cervical spine were also generated. COMPARISON:  None. FINDINGS: CT HEAD FINDINGS Brain: No acute territorial infarction, hemorrhage or intracranial mass. Mild to moderate atrophy. Moderate small vessel ischemic changes of the white matter. Prominent ventricles felt secondary to atrophy. Vascular: No hyperdense vessels.  Carotid vascular calcification. Skull: Normal. Negative for fracture or focal lesion. Sinuses/Orbits: No acute finding. Other: None CT CERVICAL SPINE FINDINGS Alignment: No subluxation.  Facet alignment is within normal limits. Skull base and vertebrae: No acute fracture. No primary bone lesion or focal pathologic process. Soft tissues and spinal canal: No prevertebral fluid or swelling. No visible canal hematoma. Disc levels: Moderate-to-marked diffuse degenerative changes C3 through C7 with mild degenerative changes at C2-C3. Multiple level bilateral facet degenerative change with multiple level bilateral foraminal stenosis. Upper chest: Apical scarring.  Carotid vascular calcification. Other: None IMPRESSION: 1. No CT evidence for acute intracranial abnormality. Atrophy with small vessel ischemic changes of the white matter 2. Straightening of the cervical spine with degenerative changes. No acute osseous abnormality. Electronically  Signed   By: Donavan Foil M.D.   On: 08/10/2018 22:12   Ct Chest W Contrast  Result Date: 08/10/2018 CLINICAL DATA:  Fall at church with right rib pain. Abd trauma, blunt, stable; Chest trauma, blunt, low energy, neg xray, normal exam and mental status EXAM: CT CHEST, ABDOMEN, AND PELVIS WITH CONTRAST TECHNIQUE: Multidetector CT imaging of the chest, abdomen and pelvis was performed following the standard protocol during bolus administration of intravenous contrast. CONTRAST:  166mL OMNIPAQUE IOHEXOL 300 MG/ML  SOLN COMPARISON:  Chest and pelvic radiographs earlier this day FINDINGS: CT CHEST FINDINGS Cardiovascular: No acute vascular injury. Aortic atherosclerosis and tortuosity. Coronary artery calcifications. No pericardial fluid. Mediastinum/Nodes: No mediastinal hematoma or hemorrhage. Patulous esophagus. Normal thyroid gland. Small mediastinal nodes not enlarged by size criteria. Lungs/Pleura: No pneumothorax. No evidence of pulmonary contusion. Streaky opacities in the dependent lower lobes, favoring atelectasis. Biapical pleuroparenchymal scarring. No significant pleural fluid.  Musculoskeletal: Right rib fractures of ribs 10 through 12. The tenth and eleventh rib fractures are segmental and fractured both anterior and posteriorly. Twelfth rib fracture is displaced. Associated subcutaneous soft tissue contusion in the posterior right flank. No fracture of the sternum, left ribs, included clavicles or shoulder girdles. Multilevel degenerative change in the thoracic spine. No fracture of the thoracic spine. CT ABDOMEN PELVIS FINDINGS Hepatobiliary: No hepatic injury or perihepatic hematoma. Punctate hepatic granuloma. Gallbladder is absent. Pancreas: No ductal dilatation or inflammation. Spleen: No splenic injury or perisplenic hematoma. 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 Adrenals/Urinary Tract: No adrenal hemorrhage or renal injury identified.  Homogeneous renal enhancement with symmetric excretion on delayed phase imaging. Bladder is unremarkable. Stomach/Bowel: Bowel evaluation limited by a lack of contrast, mild motion artifact, and paucity of intra-abdominal fat. No evidence of bowel injury. No bowel wall thickening or inflammation. No mesenteric hematoma. Colonic diverticulosis without diverticulitis. Prior appendectomy per history. No free air. Vascular/Lymphatic: No vascular injury. Aortic atherosclerosis without aneurysm. No retroperitoneal fluid. Slight prominence of the left ovarian vein. 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 No bulky abdominopelvic adenopathy. Reproductive: Uterus and bilateral adnexa are unremarkable. Other: No free air or free fluid. Musculoskeletal: Subcutaneous soft tissue edema in the posterior right flank soft tissues. Slight large mint of right paraspinal musculature likely intramuscular edema. Nondisplaced right transverse process fracture of L1. No additional fracture of the lumbar spine. No pelvic fracture. Multilevel lumbar degenerative change in scoliosis. Pubic symphyseal degenerative change. IMPRESSION: 1. Right rib fractures 10 through 12. The tenth and eleventh rib fractures are segmental. Twelfth rib fracture is displaced. Associated subcutaneous contusion in the posterior right flank. No pneumothorax. 2. Nondisplaced right L1 transverse process fracture. 3. No additional acute traumatic injury to the chest, abdomen, or pelvis. 4. Aortic Atherosclerosis (ICD10-I70.0). Coronary artery calcifications. Electronically Signed   By: Keith Rake M.D.   On: 08/10/2018 22:14   Ct Cervical Spine Wo Contrast  Result Date: 08/10/2018 CLINICAL DATA:  Tripped and hit pew at church EXAM: CT HEAD WITHOUT CONTRAST CT CERVICAL SPINE WITHOUT CONTRAST TECHNIQUE: Multidetector CT imaging of the head and cervical spine was performed following the  standard protocol without intravenous contrast. Multiplanar CT image reconstructions of the cervical spine were also generated. COMPARISON:  None. FINDINGS: CT HEAD FINDINGS Brain: No acute territorial infarction, hemorrhage or intracranial mass. Mild to moderate atrophy. Moderate small vessel ischemic changes of the white matter. Prominent ventricles felt secondary to atrophy. Vascular: No hyperdense vessels.  Carotid vascular calcification. Skull: Normal. Negative for fracture or focal lesion. Sinuses/Orbits: No acute finding. Other: None CT CERVICAL SPINE FINDINGS Alignment: No subluxation.  Facet alignment is within normal limits. Skull base and vertebrae: No acute fracture. No primary bone lesion or focal pathologic process. Soft tissues and spinal canal: No prevertebral fluid or swelling. No visible canal hematoma. Disc levels: Moderate-to-marked diffuse degenerative changes C3 through C7 with mild degenerative changes at C2-C3. Multiple level bilateral facet degenerative change with multiple level bilateral foraminal stenosis. Upper chest: Apical scarring.  Carotid vascular calcification. Other: None IMPRESSION: 1. No CT evidence for acute intracranial abnormality. Atrophy with small vessel ischemic changes of the white matter 2. Straightening of the cervical spine with degenerative changes. No acute osseous abnormality. Electronically Signed   By: Donavan Foil M.D.   On: 08/10/2018 22:12   Ct Abdomen Pelvis W Contrast  Result Date: 08/10/2018  CLINICAL DATA:  Fall at church with right rib pain. Abd trauma, blunt, stable; Chest trauma, blunt, low energy, neg xray, normal exam and mental status EXAM: CT CHEST, ABDOMEN, AND PELVIS WITH CONTRAST TECHNIQUE: Multidetector CT imaging of the chest, abdomen and pelvis was performed following the standard protocol during bolus administration of intravenous contrast. CONTRAST:  143mL OMNIPAQUE IOHEXOL 300 MG/ML  SOLN COMPARISON:  Chest and pelvic radiographs  earlier this day FINDINGS: CT CHEST FINDINGS Cardiovascular: No acute vascular injury. Aortic atherosclerosis and tortuosity. Coronary artery calcifications. No pericardial fluid. Mediastinum/Nodes: No mediastinal hematoma or hemorrhage. Patulous esophagus. Normal thyroid gland. Small mediastinal nodes not enlarged by size criteria. Lungs/Pleura: No pneumothorax. No evidence of pulmonary contusion. Streaky opacities in the dependent lower lobes, favoring atelectasis. Biapical pleuroparenchymal scarring. No significant pleural fluid. Musculoskeletal: Right rib fractures of ribs 10 through 12. The tenth and eleventh rib fractures are segmental and fractured both anterior and posteriorly. Twelfth rib fracture is displaced. Associated subcutaneous soft tissue contusion in the posterior right flank. No fracture of the sternum, left ribs, included clavicles or shoulder girdles. Multilevel degenerative change in the thoracic spine. No fracture of the thoracic spine. CT ABDOMEN PELVIS FINDINGS Hepatobiliary: No hepatic injury or perihepatic hematoma. Punctate hepatic granuloma. Gallbladder is absent. Pancreas: No ductal dilatation or inflammation. Spleen: No splenic injury or perisplenic hematoma. 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 Adrenals/Urinary Tract: No adrenal hemorrhage or renal injury identified. Homogeneous renal enhancement with symmetric excretion on delayed phase imaging. Bladder is unremarkable. Stomach/Bowel: Bowel evaluation limited by a lack of contrast, mild motion artifact, and paucity of intra-abdominal fat. No evidence of bowel injury. No bowel wall thickening or inflammation. No mesenteric hematoma. Colonic diverticulosis without diverticulitis. Prior appendectomy per history. No free air. Vascular/Lymphatic: No vascular injury. Aortic atherosclerosis without aneurysm. No retroperitoneal fluid. Slight prominence of the left ovarian vein. 11 mm  curvilinear calcification in the region of the splenic hilum is likely an incidental splenic artery aneurysm. No bulky abdominopelvic adenopathy. Reproductive: Uterus and bilateral adnexa are unremarkable. Other: No free air or free fluid. Musculoskeletal: Subcutaneous soft tissue edema in the posterior right flank soft tissues. Slight large mint of right paraspinal musculature likely intramuscular edema. Nondisplaced right transverse process fracture of L1. No additional fracture of the lumbar spine. No pelvic fracture. Multilevel lumbar degenerative change in scoliosis. Pubic symphyseal degenerative change. IMPRESSION: 1. Right rib fractures 10 through 12. The tenth and eleventh rib fractures are segmental. Twelfth rib fracture is displaced. Associated subcutaneous contusion in the posterior right flank. No pneumothorax. 2. Nondisplaced right L1 transverse process fracture. 3. No additional acute traumatic injury to the chest, abdomen, or pelvis. 4. Aortic Atherosclerosis (ICD10-I70.0). Coronary artery calcifications. Electronically Signed   By: Keith Rake M.D.   On: 08/10/2018 22:14   Dg Pelvis Portable  Result Date: 08/10/2018 CLINICAL DATA:  Post fall with right back pain. Tripped at church striking a pew. EXAM: PORTABLE PELVIS 1-2 VIEWS COMPARISON:  None. FINDINGS: The cortical margins of the bony pelvis are intact. No fracture. Chronic changes at the pubic symphysis without evidence of widening. The sacroiliac joints are congruent. Age related acetabular spurring. Both femoral heads are well-seated in the respective acetabula. IMPRESSION: No pelvic fracture. Electronically Signed   By: Keith Rake M.D.   On: 08/10/2018 20:24   Dg Chest Port 1 View  Result Date: 08/11/2018 CLINICAL DATA:  Pneumothorax. EXAM: PORTABLE CHEST 1 VIEW COMPARISON:  08/10/2018 and chest CT 08/10/2018 FINDINGS:  Lungs are adequately inflated with subtle blunting of the left costophrenic angle likely small amount left  pleural fluid. Subtle hazy density over the left mid to lower lung likely atelectasis. Right lung is clear. No pneumothorax. Cardiomediastinal silhouette and remainder of the exam is unchanged. IMPRESSION: Subtle hazy density over the left mid to lower lung likely atelectasis. Suggestion of small amount left pleural fluid. No pneumothorax. Electronically Signed   By: Marin Olp M.D.   On: 08/11/2018 09:16   Dg Chest Port 1 View  Result Date: 08/10/2018 CLINICAL DATA:  Right lower chest pain after fall. EXAM: PORTABLE CHEST 1 VIEW COMPARISON:  03/23/2016 FINDINGS: Cardiomediastinal silhouette is mildly enlarged. Calcific atherosclerotic disease of the aorta. There is no evidence of focal airspace consolidation, pleural effusion or pneumothorax. Displaced right-sided rib fracture, likely lateral tenth rib. Soft tissues are grossly normal. IMPRESSION: 1. Displaced right-sided rib fracture, likely lateral tenth rib. 2. No evidence of pneumothorax. Electronically Signed   By: Fidela Salisbury M.D.   On: 08/10/2018 20:24    Medications:    acetaminophen  1,000 mg Oral TID   atorvastatin  10 mg Oral q1800   brimonidine  1 drop Both Eyes BID   fentaNYL (SUBLIMAZE) injection  25 mcg Intravenous Once   gabapentin  300 mg Oral QHS   latanoprost  1 drop Both Eyes QHS   levothyroxine  25 mcg Oral QAC breakfast   losartan  50 mg Oral Daily   metoprolol tartrate  25 mg Oral BID   Continuous Infusions:   LOS: 0 days   Geradine Girt  [facility]   How to contact the [facility] Attending or Consulting provider [place] or covering provider during after hours [place], for this patient?  1. Check the care team in [facility] and look for a) attending/consulting [facility] provider listed and b) the [facility] team listed 2. Log into [email or web address] and use [place]'s universal password to access. If you do not have the password, please contact the hospital operator. 3. Locate the [facility] provider you are looking for  under [facility] and page to a number that you can be directly reached. 4. If you still have difficulty reaching the provider, please page the [facility] (Director on Call) for the Hospitalists listed on amion for assistance.  08/11/2018, 12:55 PM

## 2018-08-11 NOTE — ED Notes (Signed)
Report given to RN on 6N at [facility]

## 2018-08-11 NOTE — Progress Notes (Signed)
Patient was taught how to use the house phone since she will be calling her daughter between 7:30 am - 8:00 am, has a cellphone but battery went dead.

## 2018-08-11 NOTE — Evaluation (Signed)
Occupational Therapy Evaluation Patient Details Name: Robin Harrell MRN: [id number] DOB: 1933-10-07 Today's Date: 08/11/2018    History of Present Illness Pt is a 85 y/o female presenting to ED after fall and R flank pain. Imaging reveals: right rib fractures 10 through 12 and associated subcutaneous contusion, as well as nondisplaced right L1 transverse process fracture.  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    Clinical Impression   PTA patient independent and driving. Admitted for above and limited by problem list below, including pain, impaired balance, decreased activity tolerance and generalized weakness. Noted dizziness and nausea limiting mobility during session today.  Able to complete bed mobility with min assist, transfers with min assist +2 safety, UB ADLS with min assist, LB ADLs with min assist, toileting with min- min guard assist.  Patient reports spouse uses RW, but she can have her neighbor stay and assist as needed for ADLs/mobility. Patient will benefit from continued OT services while admitted and after dc at [facility] level in order to maximize independence and safety with ADLs/mobility.     Follow Up Recommendations  Home health OT;Supervision/Assistance - 24 hour    Equipment Recommendations  3 in 1 bedside commode    Recommendations for Other Services       Precautions / Restrictions Precautions Precautions: Fall Restrictions Weight Bearing Restrictions: No      Mobility Bed Mobility Overal bed mobility: Needs Assistance Bed Mobility: Supine to Sit     Supine to sit: Min assist     General bed mobility comments: min assist for trunk support to ascend to EOB, increased time and effort  Transfers Overall transfer level: Needs assistance Equipment used: Rolling walker (2 wheeled) Transfers: Sit to/from Stand Sit to Stand: Min assist;+2 safety/equipment         General  transfer comment: min assist to power up, for safety and balance     Balance Overall balance assessment: Needs assistance Sitting-balance support: No upper extremity supported;Feet supported Sitting balance-Leahy Scale: Fair     Standing balance support: Bilateral upper extremity supported;During functional activity Standing balance-Leahy Scale: Poor Standing balance comment: relaint on B UE support                           ADL either performed or assessed with clinical judgement   ADL Overall ADL's : Needs assistance/impaired     Grooming: Min guard;Standing;Wash/dry hands   Upper Body Bathing: Set up;Supervision/ safety;Sitting   Lower Body Bathing: Minimal assistance;Sit to/from stand   Upper Body Dressing : Minimal assistance;Sitting   Lower Body Dressing: Minimal assistance;Sit to/from stand   Toilet Transfer: Minimal assistance;+2 for safety/equipment;Ambulation;Comfort height toilet;Grab bars;RW   Toileting- Water quality scientist and Hygiene: Min guard;Sit to/from stand       Functional mobility during ADLs: Min guard;Minimal assistance;Rolling walker;Cueing for safety General ADL Comments: pt limited by pain, generalized weakness and decreased act tolerance     Vision         Perception     Praxis      Pertinent Vitals/Pain Pain Assessment: 0-10 Pain Score: 8  Pain Location: R ribs Pain Descriptors / Indicators: Discomfort;Grimacing;Guarding Pain Intervention(s): Limited activity within patient's tolerance;Monitored during session;Repositioned     Hand Dominance Right   Extremity/Trunk Assessment Upper Extremity Assessment Upper Extremity Assessment: Generalized weakness   Lower Extremity Assessment Lower Extremity Assessment: Defer to PT evaluation   Cervical /  Trunk Assessment Cervical / Trunk Assessment: Other exceptions Cervical / Trunk Exceptions: R rib fxs, and R L1 transverse process fx    Communication  Communication Communication: No difficulties   Cognition Arousal/Alertness: Awake/alert Behavior During Therapy: WFL for tasks assessed/performed Overall Cognitive Status: Within Functional Limits for tasks assessed                                 General Comments: appears [facility]    General Comments  noted dizziness and nausea at end of session; removed oxgyen during session with saturations at 90-95% on RA, HR 76    Exercises     Shoulder Instructions      Home Living Family/patient expects to be discharged to:: Private residence Living Arrangements: Spouse/significant other Available Help at Discharge: Family;Available 24 hours/day;Friend(s) Type of Home: House Home Access: Stairs to enter [employer] of Steps: 1 Entrance Stairs-Rails: Left Home Layout: One level     Bathroom Shower/Tub: Occupational psychologist: Standard     Home Equipment: Environmental consultant - 2 wheels;Wheelchair - manual          Prior Functioning/Environment Level of Independence: Independent        Comments: driving, IADLs         OT Problem List: Decreased strength;Decreased activity tolerance;Impaired balance (sitting and/or standing);Decreased safety awareness;Decreased knowledge of use of DME or AE;Decreased knowledge of precautions;Pain      OT Treatment/Interventions: Self-care/ADL training;DME and/or AE instruction;Therapeutic activities;Patient/family education;Balance training    OT Goals(Current goals can be found in the care plan section) Acute Rehab OT Goals Patient Stated Goal: to get back home  OT Goal Formulation: With patient Time For Goal Achievement: 08/25/18 Potential to Achieve Goals: Good  OT Frequency: Min 2X/week   Barriers to D/C:            Co-evaluation PT/OT/SLP Co-Evaluation/Treatment: Yes     OT goals addressed during session: ADL's and self-care      AM-PAC OT "6 Clicks" Daily Activity     Outcome Measure Help from another  person eating meals?: None Help from another person taking care of personal grooming?: A Little Help from another person toileting, which includes using toliet, bedpan, or urinal?: A Little Help from another person bathing (including washing, rinsing, drying)?: A Little Help from another person to put on and taking off regular upper body clothing?: A Little Help from another person to put on and taking off regular lower body clothing?: A Little 6 Click Score: 19   End of Session Equipment Utilized During Treatment: Rolling walker;Gait belt Nurse Communication: Mobility status;Other (comment)(nausea)  Activity Tolerance: Patient tolerated treatment well Patient left: in chair;with call bell/phone within reach  OT Visit Diagnosis: Other abnormalities of gait and mobility (R26.89);Muscle weakness (generalized) (M62.81);Pain Pain - Right/Left: Right Pain - part of body: (ribs)                Time: 7939-0300 OT Time Calculation (min): 39 min Charges:  OT General Charges $OT Visit: 1 Visit OT Evaluation $OT Eval Moderate Complexity: 1 Mod OT Treatments $Self Care/Home Management : 8-22 mins  Delight Stare, OT Acute Rehabilitation Services Pager [phone] Office [phone]   Delight Stare 08/11/2018, 11:04 AM

## 2018-08-11 NOTE — Progress Notes (Signed)
Received pt transfer from [facility] via [place].  Pt AOx4, VSS, O2Sat at 97% on 2L/min via , pain at 6/10 at right rib cage after bed transfer, oriented to room, call light, bed controls and plan of care, now resting on bed comfortably with both eyes closed.  Will monitor.

## 2018-08-11 NOTE — Progress Notes (Signed)
Assessment & Plan: Fall with multiple right rib fractures Hematuria  Allow clear liquid diet this AM, advance as tolerated per medical service  Encouraged pulmonary toilet, ambulation  Repeat CXR in AM 6/21  Pain control        Armandina Gemma, MD       [facility]       Office: [phone]   Chief Complaint: Fall, multiple rib fractures, hematuria  Subjective: Patient up in chair, comfortable.  Has not eaten.  Denies nausea or emesis.  Objective: Vital signs in last 24 hours: Temp:  [97.7 F (36.5 C)-98.1 F (36.7 C)] 97.7 F (36.5 C) (06/20 0558) Pulse Rate:  [50-100] 70 (06/20 0558) Resp:  [14-25] 15 (06/20 0558) BP: (120-188)/(70-101) 120/76 (06/20 0558) SpO2:  [90 %-98 %] 97 % (06/20 0558) Weight:  [63.5 kg] 63.5 kg (06/20 0500) Last BM Date: 08/10/18  Intake/Output from previous day: 06/19 0701 - 06/20 0700 In: 1283 [P.O.:90; I.V.:191.8; IV Piggyback:1001.3] Out: 925 [Urine:925] Intake/Output this shift: No intake/output data recorded.  Physical Exam: HEENT - sclerae clear, mucous membranes moist Neck - soft Chest - clear bilaterally Cor - RRR Abdomen - protuberant, non-tender Ext - no edema, non-tender Neuro - alert & oriented, no focal deficits  Lab Results:  Recent Labs    08/11/18 0047 08/11/18 0356  WBC 9.5 8.1  HGB 11.8* 11.3*  HCT 36.9 34.8*  PLT 114* 131*   BMET Recent Labs    08/10/18 2006 08/11/18 0356  NA 139 136  K 4.3 4.1  CL 103 105  CO2 25 24  GLUCOSE 116* 111*  BUN 21 15  CREATININE 0.92 0.85  CALCIUM 9.9 8.8*   PT/INR Recent Labs    08/10/18 2006 08/11/18 0356  LABPROT 30.8* 31.0*  INR 3.0* 3.0*   Comprehensive Metabolic Panel:    Component Value Date/Time   NA 136 08/11/2018 0356   NA 139 08/10/2018 2006   K 4.1 08/11/2018 0356   K 4.3 08/10/2018 2006   CL 105 08/11/2018 0356   CL 103 08/10/2018 2006   CO2 24 08/11/2018 0356   CO2 25 08/10/2018 2006   BUN 15 08/11/2018 0356   BUN 21 08/10/2018 2006   CREATININE 0.85 08/11/2018 0356   CREATININE 0.92 08/10/2018 2006   GLUCOSE 111 (H) 08/11/2018 0356   GLUCOSE 116 (H) 08/10/2018 2006   CALCIUM 8.8 (L) 08/11/2018 0356   CALCIUM 9.9 08/10/2018 2006   AST 32 08/10/2018 2006   AST 20 01/30/2018 0930   ALT 21 08/10/2018 2006   ALT 14 01/30/2018 0930   ALKPHOS 62 08/10/2018 2006   ALKPHOS 59 01/30/2018 0930   BILITOT 0.7 08/10/2018 2006   BILITOT 0.9 01/30/2018 0930   PROT 7.5 08/10/2018 2006   PROT 6.4 01/30/2018 0930   ALBUMIN 4.3 08/10/2018 2006   ALBUMIN 4.0 01/30/2018 0930    Studies/Results: Ct Head Wo Contrast  Result Date: 08/10/2018 CLINICAL DATA:  Tripped and hit pew at church EXAM: CT HEAD WITHOUT CONTRAST CT CERVICAL SPINE WITHOUT CONTRAST TECHNIQUE: Multidetector CT imaging of the head and cervical spine was performed following the standard protocol without intravenous contrast. Multiplanar CT image reconstructions of the cervical spine were also generated. COMPARISON:  None. FINDINGS: CT HEAD FINDINGS Brain: No acute territorial infarction, hemorrhage or intracranial mass. Mild to moderate atrophy. Moderate small vessel ischemic changes of the white matter. Prominent ventricles felt secondary to atrophy. Vascular: No hyperdense vessels.  Carotid vascular calcification. Skull: Normal. Negative  for fracture or focal lesion. Sinuses/Orbits: No acute finding. Other: None CT CERVICAL SPINE FINDINGS Alignment: No subluxation.  Facet alignment is within normal limits. Skull base and vertebrae: No acute fracture. No primary bone lesion or focal pathologic process. Soft tissues and spinal canal: No prevertebral fluid or swelling. No visible canal hematoma. Disc levels: Moderate-to-marked diffuse degenerative changes C3 through C7 with mild degenerative changes at C2-C3. Multiple level bilateral facet degenerative change with multiple level bilateral foraminal stenosis. Upper chest: Apical scarring.  Carotid vascular  calcification. Other: None IMPRESSION: 1. No CT evidence for acute intracranial abnormality. Atrophy with small vessel ischemic changes of the white matter 2. Straightening of the cervical spine with degenerative changes. No acute osseous abnormality. Electronically Signed   By: Donavan Foil M.D.   On: 08/10/2018 22:12   Ct Chest W Contrast  Result Date: 08/10/2018 CLINICAL DATA:  Fall at church with right rib pain. Abd trauma, blunt, stable; Chest trauma, blunt, low energy, neg xray, normal exam and mental status EXAM: CT CHEST, ABDOMEN, AND PELVIS WITH CONTRAST TECHNIQUE: Multidetector CT imaging of the chest, abdomen and pelvis was performed following the standard protocol during bolus administration of intravenous contrast. CONTRAST:  158mL OMNIPAQUE IOHEXOL 300 MG/ML  SOLN COMPARISON:  Chest and pelvic radiographs earlier this day FINDINGS: CT CHEST FINDINGS Cardiovascular: No acute vascular injury. Aortic atherosclerosis and tortuosity. Coronary artery calcifications. No pericardial fluid. Mediastinum/Nodes: No mediastinal hematoma or hemorrhage. Patulous esophagus. Normal thyroid gland. Small mediastinal nodes not enlarged by size criteria. Lungs/Pleura: No pneumothorax. No evidence of pulmonary contusion. Streaky opacities in the dependent lower lobes, favoring atelectasis. Biapical pleuroparenchymal scarring. No significant pleural fluid. Musculoskeletal: Right rib fractures of ribs 10 through 12. The tenth and eleventh rib fractures are segmental and fractured both anterior and posteriorly. Twelfth rib fracture is displaced. Associated subcutaneous soft tissue contusion in the posterior right flank. No fracture of the sternum, left ribs, included clavicles or shoulder girdles. Multilevel degenerative change in the thoracic spine. No fracture of the thoracic spine. CT ABDOMEN PELVIS FINDINGS Hepatobiliary: No hepatic injury or perihepatic hematoma. Punctate hepatic granuloma. Gallbladder is absent.  Pancreas: No ductal dilatation or inflammation. Spleen: No splenic injury or perisplenic hematoma. 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 Adrenals/Urinary Tract: No adrenal hemorrhage or renal injury identified. Homogeneous renal enhancement with symmetric excretion on delayed phase imaging. Bladder is unremarkable. Stomach/Bowel: Bowel evaluation limited by a lack of contrast, mild motion artifact, and paucity of intra-abdominal fat. No evidence of bowel injury. No bowel wall thickening or inflammation. No mesenteric hematoma. Colonic diverticulosis without diverticulitis. Prior appendectomy per history. No free air. Vascular/Lymphatic: No vascular injury. Aortic atherosclerosis without aneurysm. No retroperitoneal fluid. Slight prominence of the left ovarian vein. 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 No bulky abdominopelvic adenopathy. Reproductive: Uterus and bilateral adnexa are unremarkable. Other: No free air or free fluid. Musculoskeletal: Subcutaneous soft tissue edema in the posterior right flank soft tissues. Slight large mint of right paraspinal musculature likely intramuscular edema. Nondisplaced right transverse process fracture of L1. No additional fracture of the lumbar spine. No pelvic fracture. Multilevel lumbar degenerative change in scoliosis. Pubic symphyseal degenerative change. IMPRESSION: 1. Right rib fractures 10 through 12. The tenth and eleventh rib fractures are segmental. Twelfth rib fracture is displaced. Associated subcutaneous contusion in the posterior right flank. No pneumothorax. 2. Nondisplaced right L1 transverse process fracture. 3. No additional acute  traumatic injury to the chest, abdomen, or pelvis. 4. Aortic Atherosclerosis (ICD10-I70.0). Coronary artery calcifications. Electronically Signed   By: Keith Rake M.D.   On: 08/10/2018 22:14     Ct Cervical Spine Wo Contrast  Result Date: 08/10/2018 CLINICAL DATA:  Tripped and hit pew at church EXAM: CT HEAD WITHOUT CONTRAST CT CERVICAL SPINE WITHOUT CONTRAST TECHNIQUE: Multidetector CT imaging of the head and cervical spine was performed following the standard protocol without intravenous contrast. Multiplanar CT image reconstructions of the cervical spine were also generated. COMPARISON:  None. FINDINGS: CT HEAD FINDINGS Brain: No acute territorial infarction, hemorrhage or intracranial mass. Mild to moderate atrophy. Moderate small vessel ischemic changes of the white matter. Prominent ventricles felt secondary to atrophy. Vascular: No hyperdense vessels.  Carotid vascular calcification. Skull: Normal. Negative for fracture or focal lesion. Sinuses/Orbits: No acute finding. Other: None CT CERVICAL SPINE FINDINGS Alignment: No subluxation.  Facet alignment is within normal limits. Skull base and vertebrae: No acute fracture. No primary bone lesion or focal pathologic process. Soft tissues and spinal canal: No prevertebral fluid or swelling. No visible canal hematoma. Disc levels: Moderate-to-marked diffuse degenerative changes C3 through C7 with mild degenerative changes at C2-C3. Multiple level bilateral facet degenerative change with multiple level bilateral foraminal stenosis. Upper chest: Apical scarring.  Carotid vascular calcification. Other: None IMPRESSION: 1. No CT evidence for acute intracranial abnormality. Atrophy with small vessel ischemic changes of the white matter 2. Straightening of the cervical spine with degenerative changes. No acute osseous abnormality. Electronically Signed   By: Donavan Foil M.D.   On: 08/10/2018 22:12   Ct Abdomen Pelvis W Contrast  Result Date: 08/10/2018 CLINICAL DATA:  Fall at church with right rib pain. Abd trauma, blunt, stable; Chest trauma, blunt, low energy, neg xray, normal exam and mental status EXAM: CT CHEST, ABDOMEN, AND PELVIS WITH CONTRAST  TECHNIQUE: Multidetector CT imaging of the chest, abdomen and pelvis was performed following the standard protocol during bolus administration of intravenous contrast. CONTRAST:  154mL OMNIPAQUE IOHEXOL 300 MG/ML  SOLN COMPARISON:  Chest and pelvic radiographs earlier this day FINDINGS: CT CHEST FINDINGS Cardiovascular: No acute vascular injury. Aortic atherosclerosis and tortuosity. Coronary artery calcifications. No pericardial fluid. Mediastinum/Nodes: No mediastinal hematoma or hemorrhage. Patulous esophagus. Normal thyroid gland. Small mediastinal nodes not enlarged by size criteria. Lungs/Pleura: No pneumothorax. No evidence of pulmonary contusion. Streaky opacities in the dependent lower lobes, favoring atelectasis. Biapical pleuroparenchymal scarring. No significant pleural fluid. Musculoskeletal: Right rib fractures of ribs 10 through 12. The tenth and eleventh rib fractures are segmental and fractured both anterior and posteriorly. Twelfth rib fracture is displaced. Associated subcutaneous soft tissue contusion in the posterior right flank. No fracture of the sternum, left ribs, included clavicles or shoulder girdles. Multilevel degenerative change in the thoracic spine. No fracture of the thoracic spine. CT ABDOMEN PELVIS FINDINGS Hepatobiliary: No hepatic injury or perihepatic hematoma. Punctate hepatic granuloma. Gallbladder is absent. Pancreas: No ductal dilatation or inflammation. Spleen: No splenic injury or perisplenic hematoma. 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 Adrenals/Urinary Tract: No adrenal hemorrhage or renal injury identified. Homogeneous renal enhancement with symmetric excretion on delayed phase imaging. Bladder is unremarkable. Stomach/Bowel: Bowel evaluation limited by a lack of contrast, mild motion artifact, and paucity of intra-abdominal fat. No evidence of bowel injury. No bowel wall thickening or inflammation. No  mesenteric hematoma. Colonic diverticulosis without diverticulitis. Prior appendectomy per history. No free air. Vascular/Lymphatic: No vascular injury. Aortic atherosclerosis without  aneurysm. No retroperitoneal fluid. Slight prominence of the left ovarian vein. 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 No bulky abdominopelvic adenopathy. Reproductive: Uterus and bilateral adnexa are unremarkable. Other: No free air or free fluid. Musculoskeletal: Subcutaneous soft tissue edema in the posterior right flank soft tissues. Slight large mint of right paraspinal musculature likely intramuscular edema. Nondisplaced right transverse process fracture of L1. No additional fracture of the lumbar spine. No pelvic fracture. Multilevel lumbar degenerative change in scoliosis. Pubic symphyseal degenerative change. IMPRESSION: 1. Right rib fractures 10 through 12. The tenth and eleventh rib fractures are segmental. Twelfth rib fracture is displaced. Associated subcutaneous contusion in the posterior right flank. No pneumothorax. 2. Nondisplaced right L1 transverse process fracture. 3. No additional acute traumatic injury to the chest, abdomen, or pelvis. 4. Aortic Atherosclerosis (ICD10-I70.0). Coronary artery calcifications. Electronically Signed   By: Keith Rake M.D.   On: 08/10/2018 22:14   Dg Pelvis Portable  Result Date: 08/10/2018 CLINICAL DATA:  Post fall with right back pain. Tripped at church striking a pew. EXAM: PORTABLE PELVIS 1-2 VIEWS COMPARISON:  None. FINDINGS: The cortical margins of the bony pelvis are intact. No fracture. Chronic changes at the pubic symphysis without evidence of widening. The sacroiliac joints are congruent. Age related acetabular spurring. Both femoral heads are well-seated in the respective acetabula. IMPRESSION: No pelvic fracture. Electronically Signed   By: Keith Rake M.D.   On: 08/10/2018 20:24    Dg Chest Port 1 View  Result Date: 08/11/2018 CLINICAL DATA:  Pneumothorax. EXAM: PORTABLE CHEST 1 VIEW COMPARISON:  08/10/2018 and chest CT 08/10/2018 FINDINGS: Lungs are adequately inflated with subtle blunting of the left costophrenic angle likely small amount left pleural fluid. Subtle hazy density over the left mid to lower lung likely atelectasis. Right lung is clear. No pneumothorax. Cardiomediastinal silhouette and remainder of the exam is unchanged. IMPRESSION: Subtle hazy density over the left mid to lower lung likely atelectasis. Suggestion of small amount left pleural fluid. No pneumothorax. Electronically Signed   By: Marin Olp M.D.   On: 08/11/2018 09:16   Dg Chest Port 1 View  Result Date: 08/10/2018 CLINICAL DATA:  Right lower chest pain after fall. EXAM: PORTABLE CHEST 1 VIEW COMPARISON:  03/23/2016 FINDINGS: Cardiomediastinal silhouette is mildly enlarged. Calcific atherosclerotic disease of the aorta. There is no evidence of focal airspace consolidation, pleural effusion or pneumothorax. Displaced right-sided rib fracture, likely lateral tenth rib. Soft tissues are grossly normal. IMPRESSION: 1. Displaced right-sided rib fracture, likely lateral tenth rib. 2. No evidence of pneumothorax. Electronically Signed   By: Fidela Salisbury M.D.   On: 08/10/2018 20:24      Armandina Gemma 08/11/2018  Patient ID: Robin Harrell, female   DOB: February 08, 1934, 85 y.o.   MRN: [id number]

## 2018-08-11 NOTE — ED Notes (Signed)
Pt cleaned up after using bedpan. Urine has blood/clots as before.

## 2018-08-12 ENCOUNTER — Inpatient Hospital Stay (HOSPITAL_COMMUNITY): Payer: Medicare Other

## 2018-08-12 LAB — BASIC METABOLIC PANEL
Anion gap: 6 (ref 5–15)
BUN: 15 mg/dL (ref 8–23)
CO2: 26 mmol/L (ref 22–32)
Calcium: 8.4 mg/dL — ABNORMAL LOW (ref 8.9–10.3)
Chloride: 105 mmol/L (ref 98–111)
Creatinine, Ser: 0.84 mg/dL (ref 0.44–1.00)
GFR calc Af Amer: >60 mL/min (ref >60)
GFR calc non Af Amer: >60 mL/min (ref >60)
Glucose, Bld: 85 mg/dL (ref 70–99)
Potassium: 4 mmol/L (ref 3.5–5.1)
Sodium: 137 mmol/L (ref 135–145)

## 2018-08-12 LAB — CBC
HCT: 29.7 % — ABNORMAL LOW (ref 36.0–46.0)
Hemoglobin: 9.6 g/dL — ABNORMAL LOW (ref 12.0–15.0)
MCH: 30.5 pg (ref 26.0–34.0)
MCHC: 32.3 g/dL (ref 30.0–36.0)
MCV: 94.3 fL (ref 80.0–100.0)
Platelets: 104 10*3/uL — ABNORMAL LOW (ref 150–400)
RBC: 3.15 MIL/uL — ABNORMAL LOW (ref 3.87–5.11)
RDW: 13.2 % (ref 11.5–15.5)
WBC: 5.7 10*3/uL (ref 4.0–10.5)
nRBC: 0 % (ref 0.0–0.2)

## 2018-08-12 LAB — PROTIME-INR
INR: 4.3 (ref 0.8–1.2)
Prothrombin Time: 40.7 seconds — ABNORMAL HIGH (ref 11.4–15.2)

## 2018-08-12 LAB — GLUCOSE, CAPILLARY: Glucose-Capillary: 104 mg/dL — ABNORMAL HIGH (ref 70–99)

## 2018-08-12 MED ORDER — PHYTONADIONE 5 MG PO TABS
2.5000 mg | ORAL_TABLET | Freq: Once | ORAL | Status: AC
  Administered 2018-08-12: 2.5 mg via ORAL
  Filled 2018-08-12: qty 1

## 2018-08-12 NOTE — Progress Notes (Signed)
Occupational Therapy Treatment Patient Details Name: Robin Harrell MRN: [id number] DOB: 04/19/1933 Today's Date: 08/12/2018    History of present illness Pt is a 85 y/o female presenting to ED after fall and R flank pain. Imaging reveals: right rib fractures 10 through 12 and associated subcutaneous contusion, as well as nondisplaced right L1 transverse process fracture.  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    OT comments  Patient seated in recliner upon entry, eager to participate in OT.  Patient completing transfers using RW with min assist, mobility with min guard and cueing for walker mgmt/posture.  She is able to complete toileting with min guard, LB dressing with min assist, and grooming with min guard.  Reports her daughters will be assisting at discharge to provide 24/7 support. Will follow acutely.    Follow Up Recommendations  Home health OT;Supervision/Assistance - 24 hour    Equipment Recommendations  3 in 1 bedside commode    Recommendations for Other Services      Precautions / Restrictions Precautions Precautions: Fall Restrictions Weight Bearing Restrictions: No       Mobility Bed Mobility               General bed mobility comments: OOB upon entry  Transfers Overall transfer level: Needs assistance Equipment used: Rolling walker (2 wheeled) Transfers: Sit to/from Stand Sit to Stand: Min assist         General transfer comment: cueing for hand placement, safety and min power up to stand     Balance Overall balance assessment: Needs assistance Sitting-balance support: No upper extremity supported;Feet supported Sitting balance-Leahy Scale: Fair     Standing balance support: Bilateral upper extremity supported;During functional activity Standing balance-Leahy Scale: Poor Standing balance comment: able to engage in static standing ADls within BOS with min guard,  preference to B UE support during mobility                           ADL either performed or assessed with clinical judgement   ADL Overall ADL's : Needs assistance/impaired     Grooming: Min guard;Standing;Wash/dry hands               Lower Body Dressing: Minimal assistance;Sit to/from stand Lower Body Dressing Details (indicate cue type and reason): min assist to thread mesh underwear on, min assist sit<>Stand  Toilet Transfer: Minimal assistance;Ambulation;RW;Grab bars;Comfort height toilet   Toileting- Clothing Manipulation and Hygiene: Min guard;Sit to/from stand       Functional mobility during ADLs: Min guard;Minimal assistance;Rolling walker General ADL Comments: pt limited by pain, increased time and effort      Vision       Perception     Praxis      Cognition Arousal/Alertness: Awake/alert Behavior During Therapy: WFL for tasks assessed/performed Overall Cognitive Status: Within Functional Limits for tasks assessed                                 General Comments: appears [facility]         Exercises     Shoulder Instructions       General Comments VSS, cueing for walker mgmt and posture throughout session (foward head)    Pertinent Vitals/ Pain       Pain Assessment: Faces Faces Pain Scale: Hurts even more Pain  Location: R ribs Pain Descriptors / Indicators: Discomfort;Grimacing;Guarding Pain Intervention(s): Monitored during session;Repositioned;Premedicated before session  Home Living                                          Prior Functioning/Environment              Frequency  Min 2X/week        Progress Toward Goals  OT Goals(current goals can now be found in the care plan section)  Progress towards OT goals: Progressing toward goals  Acute Rehab OT Goals Patient Stated Goal: to get back home  OT Goal Formulation: With patient  Plan Discharge plan remains appropriate;Frequency  remains appropriate    Co-evaluation                 AM-PAC OT "6 Clicks" Daily Activity     Outcome Measure   Help from another person eating meals?: None Help from another person taking care of personal grooming?: A Little Help from another person toileting, which includes using toliet, bedpan, or urinal?: A Little Help from another person bathing (including washing, rinsing, drying)?: A Little Help from another person to put on and taking off regular upper body clothing?: A Little Help from another person to put on and taking off regular lower body clothing?: A Little 6 Click Score: 19    End of Session Equipment Utilized During Treatment: Rolling walker  OT Visit Diagnosis: Other abnormalities of gait and mobility (R26.89);Muscle weakness (generalized) (M62.81);Pain Pain - Right/Left: Right Pain - part of body: (ribs)   Activity Tolerance Patient tolerated treatment well   Patient Left in chair;with call bell/phone within reach   Nurse Communication Mobility status        Time: 4196-2229 OT Time Calculation (min): 26 min  Charges: OT General Charges $OT Visit: 1 Visit OT Treatments $Self Care/Home Management : 23-37 mins  Delight Stare, [place] Pager [phone] Office [phone]    Delight Stare 08/12/2018, 3:49 PM

## 2018-08-12 NOTE — Progress Notes (Signed)
Progress Note    Robin Harrell  LDJ:[id number] DOB: [birth year]  DOA: 08/10/2018 PCP: [name], MD    Brief Narrative:     Medical records reviewed and are as summarized below:  Robin Harrell is an 85 y.o. 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  Patient reports that she been in her usual state of health and was having an uneventful day when she was at church this evening and tripped, striking her right side against the corner of a pew.  She did not hit her head or lose consciousness.  Assessment/Plan:   Principal Problem:   Multiple rib fractures, right 10-12 Active Problems:   Essential hypertension   History of DVT (deep vein thrombosis)   GERD   Long term (current) use of anticoagulants   Hyperlipidemia   Chronic idiopathic thrombocytopenia (HCC)   Diastolic dysfunction   Factor V deficiency (HCC)   History of respiratory failure   Hypothyroidism (acquired)   Insomnia   Osteoarthritis, multiple sites   Osteoporosis   History of recurrent UTIs   Aortic stenosis, mild   Gross hematuria   Traumatic hematoma of flank   Fall against sharp object  1. Fall with multiple rib fractures; L1 transverse process fracture   - Presents with right flank pain after a mechanical fall without hitting head or losing consciousness  - She is found to have fractures of ribs 10-12 on the right, as well as L1 transverse process fracture  - Continue pain-control, incentive spirometry -PT: home health with 24/7 supervision -trauma surgery consult appreciated  2. Gross hematuria  - Patient developed gross hematuria after the fall  - There is no injury to the kidneys, ureter, or bladder identified on CT  - Patient denies difficulty voiding  -monitor closely for resolution-- still  dark but not grossly bloody  3. Factor V deficiency; history DVT: chronic anticoagulation  - No evidence for acute VTE, INR is 3.0 on admission  - Hold anticoagulation-- inr trending up--- small dose of vit K in light of hematuria and dropping Hgb  4. Thrombocytopenia  - Platelets chronically low -trend  5. Hypertension  - Continue pain-control, losartan, and Lopressor   6. Hypothyroidism  - Continue Synthroid    7. Chronic diastolic CHF  - Appears compensated   8.  Anemia -concern for ABLA (hematuria/bruising) -H/H in AM  Family Communication/Anticipated D/C date and plan/Code Status   DVT prophylaxis: coumadin held (INR therapeutic) Code Status: Full Code.  Family Communication: Disposition Plan: home once INR stable, Hgb stable and d/c planning complete for PT vs SNF   Medical Consultants:    trauma  Subjective:   No back pain different from normal  Objective:    Vitals:   08/11/18 1512 08/11/18 2037 08/12/18 0439 08/12/18 0500  BP: 128/69 110/60 118/66   Pulse: 66 85 84   Resp:  17 17   Temp: 97.6 F (36.4 C) 97.9 F (36.6 C) 97.8 F (36.6 C)   TempSrc: Oral Oral Oral   SpO2: 96% 97% 97%   Weight:    63.8 kg    Intake/Output Summary (Last 24 hours) at 08/12/2018 1206 Last data filed at 08/12/2018 1100 Gross per 24 hour  Intake 780 ml  Output 3300 ml  Net -2520 ml   Filed Weights   08/11/18  0500 08/12/18 0500  Weight: 63.5 kg 63.8 kg    Exam: In chair purewick on-- dark frothy urine-- no clots +bruise on side No LE edmea A+Ox3 Pleasant and cooperative  Data Reviewed:   I have personally reviewed following labs and imaging studies:  Labs: Labs show the following:   Basic Metabolic Panel: Recent Labs  Lab 08/10/18 2006 08/11/18 0356 08/12/18 0306  NA 139 136 137  K 4.3 4.1 4.0  CL 103 105 105  CO2 25 24 26   GLUCOSE 116* 111* 85  BUN 21 15 15   CREATININE 0.92 0.85 0.84  CALCIUM 9.9 8.8* 8.4*   GFR Estimated  Creatinine Clearance: 45.3 mL/min (by C-G formula based on SCr of 0.84 mg/dL). Liver Function Tests: Recent Labs  Lab 08/10/18 2006  AST 32  ALT 21  ALKPHOS 62  BILITOT 0.7  PROT 7.5  ALBUMIN 4.3   Recent Labs  Lab 08/10/18 2006  LIPASE 33   No results for input(s): AMMONIA in the last 168 hours. Coagulation profile Recent Labs  Lab 08/10/18 2006 08/11/18 0356 08/12/18 0306  INR 3.0* 3.0* 4.3*    CBC: Recent Labs  Lab 08/10/18 2006 08/11/18 0047 08/11/18 0356 08/12/18 0306  WBC 5.1 9.5 8.1 5.7  NEUTROABS 3.4  --   --   --   HGB 12.6 11.8* 11.3* 9.6*  HCT 39.3 36.9 34.8* 29.7*  MCV 96.6 96.1 94.1 94.3  PLT 119* 114* 131* 104*   Cardiac Enzymes: No results for input(s): CKTOTAL, CKMB, CKMBINDEX, TROPONINI in the last 168 hours. BNP (last 3 results) No results for input(s): PROBNP in the last 8760 hours. CBG: Recent Labs  Lab 08/11/18 0748 08/12/18 0745  GLUCAP 101* 104*   D-Dimer: No results for input(s): DDIMER in the last 72 hours. Hgb A1c: No results for input(s): HGBA1C in the last 72 hours. Lipid Profile: No results for input(s): CHOL, HDL, LDLCALC, TRIG, CHOLHDL, LDLDIRECT in the last 72 hours. Thyroid function studies: No results for input(s): TSH, T4TOTAL, T3FREE, THYROIDAB in the last 72 hours.  Invalid input(s): FREET3 Anemia work up: No results for input(s): VITAMINB12, FOLATE, FERRITIN, TIBC, IRON, RETICCTPCT in the last 72 hours. Sepsis Labs: Recent Labs  Lab 08/10/18 2006 08/11/18 0047 08/11/18 0356 08/12/18 0306  WBC 5.1 9.5 8.1 5.7    Microbiology Recent Results (from the past 240 hour(s))  SARS Coronavirus 2 (CEPHEID - Performed in [place] hospital lab), Hosp Order     Status: None   Collection Time: 08/10/18 10:33 PM   Specimen: Nasopharyngeal Swab  Result Value Ref Range Status   SARS Coronavirus 2 NEGATIVE NEGATIVE Final    Comment: (NOTE) If result is NEGATIVE SARS-CoV-2 target nucleic acids are NOT  DETECTED. The SARS-CoV-2 RNA is generally detectable in upper and lower  respiratory specimens during the acute phase of infection. The lowest  concentration of SARS-CoV-2 viral copies this assay can detect is 250  copies / mL. A negative result does not preclude SARS-CoV-2 infection  and should not be used as the sole basis for treatment or other  patient management decisions.  A negative result may occur with  improper specimen collection / handling, submission of specimen other  than nasopharyngeal swab, presence of viral mutation(s) within the  areas targeted by this assay, and inadequate number of viral copies  (<250 copies / mL). A negative result must be combined with clinical  observations, patient history, and epidemiological information. If result is POSITIVE SARS-CoV-2 target nucleic acids are DETECTED.  The SARS-CoV-2 RNA is generally detectable in upper and lower  respiratory specimens dur ing the acute phase of infection.  Positive  results are indicative of active infection with SARS-CoV-2.  Clinical  correlation with patient history and other diagnostic information is  necessary to determine patient infection status.  Positive results do  not rule out bacterial infection or co-infection with other viruses. If result is PRESUMPTIVE POSTIVE SARS-CoV-2 nucleic acids MAY BE PRESENT.   A presumptive positive result was obtained on the submitted specimen  and confirmed on repeat testing.  While 2019 novel coronavirus  (SARS-CoV-2) nucleic acids may be present in the submitted sample  additional confirmatory testing may be necessary for epidemiological  and / or clinical management purposes  to differentiate between  SARS-CoV-2 and other Sarbecovirus currently known to infect humans.  If clinically indicated additional testing with an alternate test  methodology [phone]) is advised. The SARS-CoV-2 RNA is generally  detectable in upper and lower respiratory sp ecimens during  the acute  phase of infection. The expected result is Negative. Fact Sheet for Patients:  StrictlyIdeas.no Fact Sheet for Healthcare Providers: BankingDealers.co.za This test is not yet approved or cleared by the [place] FDA and has been authorized for detection and/or diagnosis of SARS-CoV-2 by FDA under an Emergency Use Authorization (EUA).  This EUA will remain in effect (meaning this test can be used) for the duration of the COVID-19 declaration under Section 564(b)(1) of the Act, 21 U.S.C. section 360bbb-3(b)(1), unless the authorization is terminated or revoked sooner. Performed at [facility], [place] [address], [place],  [address]     Procedures and diagnostic studies:  Dg Chest 2 View  Result Date: 08/12/2018 CLINICAL DATA:  85 y/o  F; multiple rib fractures. EXAM: CHEST - 2 VIEW COMPARISON:  08/11/2018 chest radiograph. 08/10/2018 chest CT. FINDINGS: Stable cardiac silhouette within normal limits given projection and technique. Aortic calcific atherosclerosis. Right 10-12 rib fractures better characterized on prior CT. No new acute fracture identified. Blunting of costal diaphragmatic angles, probably small effusions. Ill-defined bibasilar opacities. No pneumothorax. IMPRESSION: 1. Small bilateral effusions and ill-defined bibasilar opacities probably represent associated atelectasis. 2. Right 10-12 rib fractures better characterized on prior CT. No new acute fracture identified. Electronically Signed   By: Kristine Garbe M.D.   On: 08/12/2018 06:48   Ct Head Wo Contrast  Result Date: 08/10/2018 CLINICAL DATA:  Tripped and hit pew at church EXAM: CT HEAD WITHOUT CONTRAST CT CERVICAL SPINE WITHOUT CONTRAST TECHNIQUE: Multidetector CT imaging of the head and cervical spine was performed following the standard protocol without intravenous contrast. Multiplanar CT image reconstructions of the  cervical spine were also generated. COMPARISON:  None. FINDINGS: CT HEAD FINDINGS Brain: No acute territorial infarction, hemorrhage or intracranial mass. Mild to moderate atrophy. Moderate small vessel ischemic changes of the white matter. Prominent ventricles felt secondary to atrophy. Vascular: No hyperdense vessels.  Carotid vascular calcification. Skull: Normal. Negative for fracture or focal lesion. Sinuses/Orbits: No acute finding. Other: None CT CERVICAL SPINE FINDINGS Alignment: No subluxation.  Facet alignment is within normal limits. Skull base and vertebrae: No acute fracture. No primary bone lesion or focal pathologic process. Soft tissues and spinal canal: No prevertebral fluid or swelling. No visible canal hematoma. Disc levels: Moderate-to-marked diffuse degenerative changes C3 through C7 with mild degenerative changes at C2-C3. Multiple level bilateral facet degenerative change with multiple level bilateral foraminal stenosis. Upper chest: Apical scarring.  Carotid vascular calcification. Other: None IMPRESSION: 1. No CT  evidence for acute intracranial abnormality. Atrophy with small vessel ischemic changes of the white matter 2. Straightening of the cervical spine with degenerative changes. No acute osseous abnormality. Electronically Signed   By: Donavan Foil M.D.   On: 08/10/2018 22:12   Ct Chest W Contrast  Result Date: 08/10/2018 CLINICAL DATA:  Fall at church with right rib pain. Abd trauma, blunt, stable; Chest trauma, blunt, low energy, neg xray, normal exam and mental status EXAM: CT CHEST, ABDOMEN, AND PELVIS WITH CONTRAST TECHNIQUE: Multidetector CT imaging of the chest, abdomen and pelvis was performed following the standard protocol during bolus administration of intravenous contrast. CONTRAST:  179mL OMNIPAQUE IOHEXOL 300 MG/ML  SOLN COMPARISON:  Chest and pelvic radiographs earlier this day FINDINGS: CT CHEST FINDINGS Cardiovascular: No acute vascular injury. Aortic  atherosclerosis and tortuosity. Coronary artery calcifications. No pericardial fluid. Mediastinum/Nodes: No mediastinal hematoma or hemorrhage. Patulous esophagus. Normal thyroid gland. Small mediastinal nodes not enlarged by size criteria. Lungs/Pleura: No pneumothorax. No evidence of pulmonary contusion. Streaky opacities in the dependent lower lobes, favoring atelectasis. Biapical pleuroparenchymal scarring. No significant pleural fluid. Musculoskeletal: Right rib fractures of ribs 10 through 12. The tenth and eleventh rib fractures are segmental and fractured both anterior and posteriorly. Twelfth rib fracture is displaced. Associated subcutaneous soft tissue contusion in the posterior right flank. No fracture of the sternum, left ribs, included clavicles or shoulder girdles. Multilevel degenerative change in the thoracic spine. No fracture of the thoracic spine. CT ABDOMEN PELVIS FINDINGS Hepatobiliary: No hepatic injury or perihepatic hematoma. Punctate hepatic granuloma. Gallbladder is absent. Pancreas: No ductal dilatation or inflammation. Spleen: No splenic injury or perisplenic hematoma. 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 Adrenals/Urinary Tract: No adrenal hemorrhage or renal injury identified. Homogeneous renal enhancement with symmetric excretion on delayed phase imaging. Bladder is unremarkable. Stomach/Bowel: Bowel evaluation limited by a lack of contrast, mild motion artifact, and paucity of intra-abdominal fat. No evidence of bowel injury. No bowel wall thickening or inflammation. No mesenteric hematoma. Colonic diverticulosis without diverticulitis. Prior appendectomy per history. No free air. Vascular/Lymphatic: No vascular injury. Aortic atherosclerosis without aneurysm. No retroperitoneal fluid. Slight prominence of the left ovarian vein. 11 mm curvilinear calcification in the region of the splenic hilum is likely an incidental splenic  artery aneurysm. No bulky abdominopelvic adenopathy. Reproductive: Uterus and bilateral adnexa are unremarkable. Other: No free air or free fluid. Musculoskeletal: Subcutaneous soft tissue edema in the posterior right flank soft tissues. Slight large mint of right paraspinal musculature likely intramuscular edema. Nondisplaced right transverse process fracture of L1. No additional fracture of the lumbar spine. No pelvic fracture. Multilevel lumbar degenerative change in scoliosis. Pubic symphyseal degenerative change. IMPRESSION: 1. Right rib fractures 10 through 12. The tenth and eleventh rib fractures are segmental. Twelfth rib fracture is displaced. Associated subcutaneous contusion in the posterior right flank. No pneumothorax. 2. Nondisplaced right L1 transverse process fracture. 3. No additional acute traumatic injury to the chest, abdomen, or pelvis. 4. Aortic Atherosclerosis (ICD10-I70.0). Coronary artery calcifications. Electronically Signed   By: Keith Rake M.D.   On: 08/10/2018 22:14   Ct Cervical Spine Wo Contrast  Result Date: 08/10/2018 CLINICAL DATA:  Tripped and hit pew at church EXAM: CT HEAD WITHOUT CONTRAST CT CERVICAL SPINE WITHOUT CONTRAST TECHNIQUE: Multidetector CT imaging of the head and cervical spine was performed following the standard protocol without intravenous contrast. Multiplanar CT image reconstructions of the cervical spine were also generated. COMPARISON:  None. FINDINGS: CT HEAD  FINDINGS Brain: No acute territorial infarction, hemorrhage or intracranial mass. Mild to moderate atrophy. Moderate small vessel ischemic changes of the white matter. Prominent ventricles felt secondary to atrophy. Vascular: No hyperdense vessels.  Carotid vascular calcification. Skull: Normal. Negative for fracture or focal lesion. Sinuses/Orbits: No acute finding. Other: None CT CERVICAL SPINE FINDINGS Alignment: No subluxation.  Facet alignment is within normal limits. Skull base and  vertebrae: No acute fracture. No primary bone lesion or focal pathologic process. Soft tissues and spinal canal: No prevertebral fluid or swelling. No visible canal hematoma. Disc levels: Moderate-to-marked diffuse degenerative changes C3 through C7 with mild degenerative changes at C2-C3. Multiple level bilateral facet degenerative change with multiple level bilateral foraminal stenosis. Upper chest: Apical scarring.  Carotid vascular calcification. Other: None IMPRESSION: 1. No CT evidence for acute intracranial abnormality. Atrophy with small vessel ischemic changes of the white matter 2. Straightening of the cervical spine with degenerative changes. No acute osseous abnormality. Electronically Signed   By: Donavan Foil M.D.   On: 08/10/2018 22:12   Ct Abdomen Pelvis W Contrast  Result Date: 08/10/2018 CLINICAL DATA:  Fall at church with right rib pain. Abd trauma, blunt, stable; Chest trauma, blunt, low energy, neg xray, normal exam and mental status EXAM: CT CHEST, ABDOMEN, AND PELVIS WITH CONTRAST TECHNIQUE: Multidetector CT imaging of the chest, abdomen and pelvis was performed following the standard protocol during bolus administration of intravenous contrast. CONTRAST:  166mL OMNIPAQUE IOHEXOL 300 MG/ML  SOLN COMPARISON:  Chest and pelvic radiographs earlier this day FINDINGS: CT CHEST FINDINGS Cardiovascular: No acute vascular injury. Aortic atherosclerosis and tortuosity. Coronary artery calcifications. No pericardial fluid. Mediastinum/Nodes: No mediastinal hematoma or hemorrhage. Patulous esophagus. Normal thyroid gland. Small mediastinal nodes not enlarged by size criteria. Lungs/Pleura: No pneumothorax. No evidence of pulmonary contusion. Streaky opacities in the dependent lower lobes, favoring atelectasis. Biapical pleuroparenchymal scarring. No significant pleural fluid. Musculoskeletal: Right rib fractures of ribs 10 through 12. The tenth and eleventh rib fractures are segmental and fractured  both anterior and posteriorly. Twelfth rib fracture is displaced. Associated subcutaneous soft tissue contusion in the posterior right flank. No fracture of the sternum, left ribs, included clavicles or shoulder girdles. Multilevel degenerative change in the thoracic spine. No fracture of the thoracic spine. CT ABDOMEN PELVIS FINDINGS Hepatobiliary: No hepatic injury or perihepatic hematoma. Punctate hepatic granuloma. Gallbladder is absent. Pancreas: No ductal dilatation or inflammation. Spleen: No splenic injury or perisplenic hematoma. 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 Adrenals/Urinary Tract: No adrenal hemorrhage or renal injury identified. Homogeneous renal enhancement with symmetric excretion on delayed phase imaging. Bladder is unremarkable. Stomach/Bowel: Bowel evaluation limited by a lack of contrast, mild motion artifact, and paucity of intra-abdominal fat. No evidence of bowel injury. No bowel wall thickening or inflammation. No mesenteric hematoma. Colonic diverticulosis without diverticulitis. Prior appendectomy per history. No free air. Vascular/Lymphatic: No vascular injury. Aortic atherosclerosis without aneurysm. No retroperitoneal fluid. Slight prominence of the left ovarian vein. 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 No bulky abdominopelvic adenopathy. Reproductive: Uterus and bilateral adnexa are unremarkable. Other: No free air or free fluid. Musculoskeletal: Subcutaneous soft tissue edema in the posterior right flank soft tissues. Slight large mint of right paraspinal musculature likely intramuscular edema. Nondisplaced right transverse process fracture of L1. No additional fracture of the lumbar spine. No pelvic fracture. Multilevel lumbar degenerative change in scoliosis. Pubic symphyseal degenerative change. IMPRESSION:  1. Right rib fractures 10 through  12. The tenth and eleventh rib fractures are segmental. Twelfth rib fracture is displaced. Associated subcutaneous contusion in the posterior right flank. No pneumothorax. 2. Nondisplaced right L1 transverse process fracture. 3. No additional acute traumatic injury to the chest, abdomen, or pelvis. 4. Aortic Atherosclerosis (ICD10-I70.0). Coronary artery calcifications. Electronically Signed   By: Keith Rake M.D.   On: 08/10/2018 22:14   Dg Pelvis Portable  Result Date: 08/10/2018 CLINICAL DATA:  Post fall with right back pain. Tripped at church striking a pew. EXAM: PORTABLE PELVIS 1-2 VIEWS COMPARISON:  None. FINDINGS: The cortical margins of the bony pelvis are intact. No fracture. Chronic changes at the pubic symphysis without evidence of widening. The sacroiliac joints are congruent. Age related acetabular spurring. Both femoral heads are well-seated in the respective acetabula. IMPRESSION: No pelvic fracture. Electronically Signed   By: Keith Rake M.D.   On: 08/10/2018 20:24   Dg Chest Port 1 View  Result Date: 08/11/2018 CLINICAL DATA:  Pneumothorax. EXAM: PORTABLE CHEST 1 VIEW COMPARISON:  08/10/2018 and chest CT 08/10/2018 FINDINGS: Lungs are adequately inflated with subtle blunting of the left costophrenic angle likely small amount left pleural fluid. Subtle hazy density over the left mid to lower lung likely atelectasis. Right lung is clear. No pneumothorax. Cardiomediastinal silhouette and remainder of the exam is unchanged. IMPRESSION: Subtle hazy density over the left mid to lower lung likely atelectasis. Suggestion of small amount left pleural fluid. No pneumothorax. Electronically Signed   By: Marin Olp M.D.   On: 08/11/2018 09:16   Dg Chest Port 1 View  Result Date: 08/10/2018 CLINICAL DATA:  Right lower chest pain after fall. EXAM: PORTABLE CHEST 1 VIEW COMPARISON:  03/23/2016 FINDINGS: Cardiomediastinal silhouette is mildly enlarged. Calcific atherosclerotic disease of  the aorta. There is no evidence of focal airspace consolidation, pleural effusion or pneumothorax. Displaced right-sided rib fracture, likely lateral tenth rib. Soft tissues are grossly normal. IMPRESSION: 1. Displaced right-sided rib fracture, likely lateral tenth rib. 2. No evidence of pneumothorax. Electronically Signed   By: Fidela Salisbury M.D.   On: 08/10/2018 20:24    Medications:    acetaminophen  1,000 mg Oral TID   atorvastatin  10 mg Oral q1800   brimonidine  1 drop Both Eyes BID   fentaNYL (SUBLIMAZE) injection  25 mcg Intravenous Once   gabapentin  300 mg Oral QHS   latanoprost  1 drop Both Eyes QHS   levothyroxine  25 mcg Oral QAC breakfast   losartan  50 mg Oral Daily   metoprolol tartrate  25 mg Oral BID   phytonadione  2.5 mg Oral Once   Continuous Infusions:   LOS: 1 day   Geradine Girt  [facility]   How to contact the [facility] Attending or Consulting provider [place] or covering provider during after hours [place], for this patient?  1. Check the care team in [facility] and look for a) attending/consulting [facility] provider listed and b) the [facility] team listed 2. Log into [email or web address] and use [place]'s universal password to access. If you do not have the password, please contact the hospital operator. 3. Locate the [facility] provider you are looking for under [facility] and page to a number that you can be directly reached. 4. If you still have difficulty reaching the provider, please page the [facility] (Director on Call) for the Hospitalists listed on amion for assistance.  08/12/2018, 12:06 PM

## 2018-08-12 NOTE — Progress Notes (Addendum)
CRITICAL VALUE ALERT  Critical Value:  Prothrombin 40.7, INR 4.3                         Urine output tea-colored, cloudy   Patient not in distress.                         Date & Time Notied:  08/12/18; 8102  Provider Notified: Dr. Silas [place]  Orders Received/Actions taken: Awaiting response  No response from [facility] on-call, will endorse to day shift RN appropriately.

## 2018-08-12 NOTE — Progress Notes (Signed)
Assessment & Plan: Fall with multiple right rib fractures Hematuria             Advance to regular diet as tolerated             Encouraged pulmonary toilet, ambulation             CXR 6/21 - small bilat effusion, no PTX             Pain control - taking Tylenol currently  Home per medical service.  Will follow.        Armandina Gemma, MD       [facility]       Office: [phone]   Chief Complaint: Fall, rib fractures  Subjective: Patient in bed, comfortable, taking Tylenol for pain.  Doesn't like liquid diet - will advance.  Objective: Vital signs in last 24 hours: Temp:  [97.6 F (36.4 C)-97.9 F (36.6 C)] 97.8 F (36.6 C) (06/21 0439) Pulse Rate:  [66-85] 84 (06/21 0439) Resp:  [17] 17 (06/21 0439) BP: (110-128)/(60-69) 118/66 (06/21 0439) SpO2:  [96 %-97 %] 97 % (06/21 0439) Weight:  [63.8 kg] 63.8 kg (06/21 0500) Last BM Date: 08/10/18  Intake/Output from previous day: 06/20 0701 - 06/21 0700 In: 620 [P.O.:620] Out: 2500 [Urine:2500] Intake/Output this shift: No intake/output data recorded.  Physical Exam: HEENT - sclerae clear, mucous membranes moist Neck - soft Chest - good BS bilaterally, no wheeze, no crepitance Cor - RRR Ext - no edema, non-tender Neuro - alert & oriented, no focal deficits  Lab Results:  Recent Labs    08/11/18 0356 08/12/18 0306  WBC 8.1 5.7  HGB 11.3* 9.6*  HCT 34.8* 29.7*  PLT 131* 104*   BMET Recent Labs    08/11/18 0356 08/12/18 0306  NA 136 137  K 4.1 4.0  CL 105 105  CO2 24 26  GLUCOSE 111* 85  BUN 15 15  CREATININE 0.85 0.84  CALCIUM 8.8* 8.4*   PT/INR Recent Labs    08/11/18 0356 08/12/18 0306  LABPROT 31.0* 40.7*  INR 3.0* 4.3*   Comprehensive Metabolic Panel:    Component Value Date/Time   NA 137 08/12/2018 0306   NA 136 08/11/2018 0356   K 4.0 08/12/2018 0306   K 4.1 08/11/2018 0356   CL 105 08/12/2018 0306   CL 105 08/11/2018 0356   CO2 26 08/12/2018 0306   CO2  24 08/11/2018 0356   BUN 15 08/12/2018 0306   BUN 15 08/11/2018 0356   CREATININE 0.84 08/12/2018 0306   CREATININE 0.85 08/11/2018 0356   GLUCOSE 85 08/12/2018 0306   GLUCOSE 111 (H) 08/11/2018 0356   CALCIUM 8.4 (L) 08/12/2018 0306   CALCIUM 8.8 (L) 08/11/2018 0356   AST 32 08/10/2018 2006   AST 20 01/30/2018 0930   ALT 21 08/10/2018 2006   ALT 14 01/30/2018 0930   ALKPHOS 62 08/10/2018 2006   ALKPHOS 59 01/30/2018 0930   BILITOT 0.7 08/10/2018 2006   BILITOT 0.9 01/30/2018 0930   PROT 7.5 08/10/2018 2006   PROT 6.4 01/30/2018 0930   ALBUMIN 4.3 08/10/2018 2006   ALBUMIN 4.0 01/30/2018 0930    Studies/Results: Dg Chest 2 View  Result Date: 08/12/2018 CLINICAL DATA:  85 y/o  F; multiple rib fractures. EXAM: CHEST - 2 VIEW COMPARISON:  08/11/2018 chest radiograph. 08/10/2018 chest CT. FINDINGS: Stable cardiac silhouette within normal limits given projection and technique. Aortic calcific atherosclerosis. Right 10-12 rib fractures better characterized  on prior CT. No new acute fracture identified. Blunting of costal diaphragmatic angles, probably small effusions. Ill-defined bibasilar opacities. No pneumothorax. IMPRESSION: 1. Small bilateral effusions and ill-defined bibasilar opacities probably represent associated atelectasis. 2. Right 10-12 rib fractures better characterized on prior CT. No new acute fracture identified. Electronically Signed   By: Kristine Garbe M.D.   On: 08/12/2018 06:48   Ct Head Wo Contrast  Result Date: 08/10/2018 CLINICAL DATA:  Tripped and hit pew at church EXAM: CT HEAD WITHOUT CONTRAST CT CERVICAL SPINE WITHOUT CONTRAST TECHNIQUE: Multidetector CT imaging of the head and cervical spine was performed following the standard protocol without intravenous contrast. Multiplanar CT image reconstructions of the cervical spine were also generated. COMPARISON:  None. FINDINGS: CT HEAD FINDINGS Brain: No acute territorial infarction, hemorrhage or  intracranial mass. Mild to moderate atrophy. Moderate small vessel ischemic changes of the white matter. Prominent ventricles felt secondary to atrophy. Vascular: No hyperdense vessels.  Carotid vascular calcification. Skull: Normal. Negative for fracture or focal lesion. Sinuses/Orbits: No acute finding. Other: None CT CERVICAL SPINE FINDINGS Alignment: No subluxation.  Facet alignment is within normal limits. Skull base and vertebrae: No acute fracture. No primary bone lesion or focal pathologic process. Soft tissues and spinal canal: No prevertebral fluid or swelling. No visible canal hematoma. Disc levels: Moderate-to-marked diffuse degenerative changes C3 through C7 with mild degenerative changes at C2-C3. Multiple level bilateral facet degenerative change with multiple level bilateral foraminal stenosis. Upper chest: Apical scarring.  Carotid vascular calcification. Other: None IMPRESSION: 1. No CT evidence for acute intracranial abnormality. Atrophy with small vessel ischemic changes of the white matter 2. Straightening of the cervical spine with degenerative changes. No acute osseous abnormality. Electronically Signed   By: Donavan Foil M.D.   On: 08/10/2018 22:12   Ct Chest W Contrast  Result Date: 08/10/2018 CLINICAL DATA:  Fall at church with right rib pain. Abd trauma, blunt, stable; Chest trauma, blunt, low energy, neg xray, normal exam and mental status EXAM: CT CHEST, ABDOMEN, AND PELVIS WITH CONTRAST TECHNIQUE: Multidetector CT imaging of the chest, abdomen and pelvis was performed following the standard protocol during bolus administration of intravenous contrast. CONTRAST:  132mL OMNIPAQUE IOHEXOL 300 MG/ML  SOLN COMPARISON:  Chest and pelvic radiographs earlier this day FINDINGS: CT CHEST FINDINGS Cardiovascular: No acute vascular injury. Aortic atherosclerosis and tortuosity. Coronary artery calcifications. No pericardial fluid. Mediastinum/Nodes: No mediastinal hematoma or hemorrhage.  Patulous esophagus. Normal thyroid gland. Small mediastinal nodes not enlarged by size criteria. Lungs/Pleura: No pneumothorax. No evidence of pulmonary contusion. Streaky opacities in the dependent lower lobes, favoring atelectasis. Biapical pleuroparenchymal scarring. No significant pleural fluid. Musculoskeletal: Right rib fractures of ribs 10 through 12. The tenth and eleventh rib fractures are segmental and fractured both anterior and posteriorly. Twelfth rib fracture is displaced. Associated subcutaneous soft tissue contusion in the posterior right flank. No fracture of the sternum, left ribs, included clavicles or shoulder girdles. Multilevel degenerative change in the thoracic spine. No fracture of the thoracic spine. CT ABDOMEN PELVIS FINDINGS Hepatobiliary: No hepatic injury or perihepatic hematoma. Punctate hepatic granuloma. Gallbladder is absent. Pancreas: No ductal dilatation or inflammation. Spleen: No splenic injury or perisplenic hematoma. 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 Adrenals/Urinary Tract: No adrenal hemorrhage or renal injury identified. Homogeneous renal enhancement with symmetric excretion on delayed phase imaging. Bladder is unremarkable. Stomach/Bowel: Bowel evaluation limited by a lack of contrast, mild motion artifact, and paucity of intra-abdominal fat. No evidence  of bowel injury. No bowel wall thickening or inflammation. No mesenteric hematoma. Colonic diverticulosis without diverticulitis. Prior appendectomy per history. No free air. Vascular/Lymphatic: No vascular injury. Aortic atherosclerosis without aneurysm. No retroperitoneal fluid. Slight prominence of the left ovarian vein. 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 No bulky abdominopelvic adenopathy. Reproductive: Uterus and bilateral adnexa are unremarkable. Other: No free air or free  fluid. Musculoskeletal: Subcutaneous soft tissue edema in the posterior right flank soft tissues. Slight large mint of right paraspinal musculature likely intramuscular edema. Nondisplaced right transverse process fracture of L1. No additional fracture of the lumbar spine. No pelvic fracture. Multilevel lumbar degenerative change in scoliosis. Pubic symphyseal degenerative change. IMPRESSION: 1. Right rib fractures 10 through 12. The tenth and eleventh rib fractures are segmental. Twelfth rib fracture is displaced. Associated subcutaneous contusion in the posterior right flank. No pneumothorax. 2. Nondisplaced right L1 transverse process fracture. 3. No additional acute traumatic injury to the chest, abdomen, or pelvis. 4. Aortic Atherosclerosis (ICD10-I70.0). Coronary artery calcifications. Electronically Signed   By: Keith Rake M.D.   On: 08/10/2018 22:14   Ct Cervical Spine Wo Contrast  Result Date: 08/10/2018 CLINICAL DATA:  Tripped and hit pew at church EXAM: CT HEAD WITHOUT CONTRAST CT CERVICAL SPINE WITHOUT CONTRAST TECHNIQUE: Multidetector CT imaging of the head and cervical spine was performed following the standard protocol without intravenous contrast. Multiplanar CT image reconstructions of the cervical spine were also generated. COMPARISON:  None. FINDINGS: CT HEAD FINDINGS Brain: No acute territorial infarction, hemorrhage or intracranial mass. Mild to moderate atrophy. Moderate small vessel ischemic changes of the white matter. Prominent ventricles felt secondary to atrophy. Vascular: No hyperdense vessels.  Carotid vascular calcification. Skull: Normal. Negative for fracture or focal lesion. Sinuses/Orbits: No acute finding. Other: None CT CERVICAL SPINE FINDINGS Alignment: No subluxation.  Facet alignment is within normal limits. Skull base and vertebrae: No acute fracture. No primary bone lesion or focal pathologic process. Soft tissues and spinal canal: No prevertebral fluid or swelling.  No visible canal hematoma. Disc levels: Moderate-to-marked diffuse degenerative changes C3 through C7 with mild degenerative changes at C2-C3. Multiple level bilateral facet degenerative change with multiple level bilateral foraminal stenosis. Upper chest: Apical scarring.  Carotid vascular calcification. Other: None IMPRESSION: 1. No CT evidence for acute intracranial abnormality. Atrophy with small vessel ischemic changes of the white matter 2. Straightening of the cervical spine with degenerative changes. No acute osseous abnormality. Electronically Signed   By: Donavan Foil M.D.   On: 08/10/2018 22:12   Ct Abdomen Pelvis W Contrast  Result Date: 08/10/2018 CLINICAL DATA:  Fall at church with right rib pain. Abd trauma, blunt, stable; Chest trauma, blunt, low energy, neg xray, normal exam and mental status EXAM: CT CHEST, ABDOMEN, AND PELVIS WITH CONTRAST TECHNIQUE: Multidetector CT imaging of the chest, abdomen and pelvis was performed following the standard protocol during bolus administration of intravenous contrast. CONTRAST:  118mL OMNIPAQUE IOHEXOL 300 MG/ML  SOLN COMPARISON:  Chest and pelvic radiographs earlier this day FINDINGS: CT CHEST FINDINGS Cardiovascular: No acute vascular injury. Aortic atherosclerosis and tortuosity. Coronary artery calcifications. No pericardial fluid. Mediastinum/Nodes: No mediastinal hematoma or hemorrhage. Patulous esophagus. Normal thyroid gland. Small mediastinal nodes not enlarged by size criteria. Lungs/Pleura: No pneumothorax. No evidence of pulmonary contusion. Streaky opacities in the dependent lower lobes, favoring atelectasis. Biapical pleuroparenchymal scarring. No significant pleural fluid. Musculoskeletal: Right rib fractures of ribs 10 through 12. The tenth  and eleventh rib fractures are segmental and fractured both anterior and posteriorly. Twelfth rib fracture is displaced. Associated subcutaneous soft tissue contusion in the posterior right flank. No  fracture of the sternum, left ribs, included clavicles or shoulder girdles. Multilevel degenerative change in the thoracic spine. No fracture of the thoracic spine. CT ABDOMEN PELVIS FINDINGS Hepatobiliary: No hepatic injury or perihepatic hematoma. Punctate hepatic granuloma. Gallbladder is absent. Pancreas: No ductal dilatation or inflammation. Spleen: No splenic injury or perisplenic hematoma. 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 Adrenals/Urinary Tract: No adrenal hemorrhage or renal injury identified. Homogeneous renal enhancement with symmetric excretion on delayed phase imaging. Bladder is unremarkable. Stomach/Bowel: Bowel evaluation limited by a lack of contrast, mild motion artifact, and paucity of intra-abdominal fat. No evidence of bowel injury. No bowel wall thickening or inflammation. No mesenteric hematoma. Colonic diverticulosis without diverticulitis. Prior appendectomy per history. No free air. Vascular/Lymphatic: No vascular injury. Aortic atherosclerosis without aneurysm. No retroperitoneal fluid. Slight prominence of the left ovarian vein. 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 No bulky abdominopelvic adenopathy. Reproductive: Uterus and bilateral adnexa are unremarkable. Other: No free air or free fluid. Musculoskeletal: Subcutaneous soft tissue edema in the posterior right flank soft tissues. Slight large mint of right paraspinal musculature likely intramuscular edema. Nondisplaced right transverse process fracture of L1. No additional fracture of the lumbar spine. No pelvic fracture. Multilevel lumbar degenerative change in scoliosis. Pubic symphyseal degenerative change. IMPRESSION: 1. Right rib fractures 10 through 12. The tenth and eleventh rib fractures are segmental. Twelfth rib fracture is displaced. Associated subcutaneous contusion in the posterior  right flank. No pneumothorax. 2. Nondisplaced right L1 transverse process fracture. 3. No additional acute traumatic injury to the chest, abdomen, or pelvis. 4. Aortic Atherosclerosis (ICD10-I70.0). Coronary artery calcifications. Electronically Signed   By: Keith Rake M.D.   On: 08/10/2018 22:14   Dg Pelvis Portable  Result Date: 08/10/2018 CLINICAL DATA:  Post fall with right back pain. Tripped at church striking a pew. EXAM: PORTABLE PELVIS 1-2 VIEWS COMPARISON:  None. FINDINGS: The cortical margins of the bony pelvis are intact. No fracture. Chronic changes at the pubic symphysis without evidence of widening. The sacroiliac joints are congruent. Age related acetabular spurring. Both femoral heads are well-seated in the respective acetabula. IMPRESSION: No pelvic fracture. Electronically Signed   By: Keith Rake M.D.   On: 08/10/2018 20:24   Dg Chest Port 1 View  Result Date: 08/11/2018 CLINICAL DATA:  Pneumothorax. EXAM: PORTABLE CHEST 1 VIEW COMPARISON:  08/10/2018 and chest CT 08/10/2018 FINDINGS: Lungs are adequately inflated with subtle blunting of the left costophrenic angle likely small amount left pleural fluid. Subtle hazy density over the left mid to lower lung likely atelectasis. Right lung is clear. No pneumothorax. Cardiomediastinal silhouette and remainder of the exam is unchanged. IMPRESSION: Subtle hazy density over the left mid to lower lung likely atelectasis. Suggestion of small amount left pleural fluid. No pneumothorax. Electronically Signed   By: Marin Olp M.D.   On: 08/11/2018 09:16   Dg Chest Port 1 View  Result Date: 08/10/2018 CLINICAL DATA:  Right lower chest pain after fall. EXAM: PORTABLE CHEST 1 VIEW COMPARISON:  03/23/2016 FINDINGS: Cardiomediastinal silhouette is mildly enlarged. Calcific atherosclerotic disease of the aorta. There is no evidence of focal airspace consolidation, pleural effusion or pneumothorax. Displaced right-sided rib fracture, likely  lateral tenth rib. Soft tissues are grossly normal.  IMPRESSION: 1. Displaced right-sided rib fracture, likely lateral tenth rib. 2. No evidence of pneumothorax. Electronically Signed   By: Fidela Salisbury M.D.   On: 08/10/2018 20:24      Armandina Gemma 08/12/2018  Patient ID: Robin Harrell, female   DOB: December 07, 1933, 85 y.o.   MRN: [id number]

## 2018-08-13 DIAGNOSIS — S301XXA Contusion of abdominal wall, initial encounter: Secondary | ICD-10-CM

## 2018-08-13 LAB — URINALYSIS, ROUTINE W REFLEX MICROSCOPIC
Bilirubin Urine: NEGATIVE
Glucose, UA: NEGATIVE mg/dL
Ketones, ur: NEGATIVE mg/dL
Nitrite: NEGATIVE
Protein, ur: 100 mg/dL — AB
RBC / HPF: >50 RBC/hpf — ABNORMAL HIGH (ref 0–5)
Specific Gravity, Urine: 1.014 (ref 1.005–1.030)
pH: 7 (ref 5.0–8.0)

## 2018-08-13 LAB — BASIC METABOLIC PANEL
Anion gap: 7 (ref 5–15)
BUN: 15 mg/dL (ref 8–23)
CO2: 25 mmol/L (ref 22–32)
Calcium: 8.7 mg/dL — ABNORMAL LOW (ref 8.9–10.3)
Chloride: 106 mmol/L (ref 98–111)
Creatinine, Ser: 0.89 mg/dL (ref 0.44–1.00)
GFR calc Af Amer: >60 mL/min (ref >60)
GFR calc non Af Amer: >60 mL/min (ref >60)
Glucose, Bld: 108 mg/dL — ABNORMAL HIGH (ref 70–99)
Potassium: 4.3 mmol/L (ref 3.5–5.1)
Sodium: 138 mmol/L (ref 135–145)

## 2018-08-13 LAB — CBC
HCT: 32.4 % — ABNORMAL LOW (ref 36.0–46.0)
Hemoglobin: 10.5 g/dL — ABNORMAL LOW (ref 12.0–15.0)
MCH: 30.5 pg (ref 26.0–34.0)
MCHC: 32.4 g/dL (ref 30.0–36.0)
MCV: 94.2 fL (ref 80.0–100.0)
Platelets: 114 10*3/uL — ABNORMAL LOW (ref 150–400)
RBC: 3.44 MIL/uL — ABNORMAL LOW (ref 3.87–5.11)
RDW: 13.1 % (ref 11.5–15.5)
WBC: 7 10*3/uL (ref 4.0–10.5)
nRBC: 0 % (ref 0.0–0.2)

## 2018-08-13 LAB — PROTIME-INR
INR: 1.9 — ABNORMAL HIGH (ref 0.8–1.2)
Prothrombin Time: 21.8 seconds — ABNORMAL HIGH (ref 11.4–15.2)

## 2018-08-13 LAB — GLUCOSE, CAPILLARY: Glucose-Capillary: 99 mg/dL (ref 70–99)

## 2018-08-13 MED ORDER — DOCUSATE SODIUM 100 MG PO CAPS
100.0000 mg | ORAL_CAPSULE | Freq: Two times a day (BID) | ORAL | Status: DC
  Administered 2018-08-13 – 2018-08-17 (×9): 100 mg via ORAL
  Filled 2018-08-13 (×9): qty 1

## 2018-08-13 MED ORDER — SODIUM CHLORIDE 0.9 % IV SOLN
1.0000 g | INTRAVENOUS | Status: DC
  Administered 2018-08-13 – 2018-08-17 (×5): 1 g via INTRAVENOUS
  Filled 2018-08-13: qty 10
  Filled 2018-08-13: qty 1
  Filled 2018-08-13: qty 10
  Filled 2018-08-13 (×3): qty 1

## 2018-08-13 MED ORDER — WARFARIN - PHARMACIST DOSING INPATIENT
Freq: Every day | Status: DC
  Administered 2018-08-14 – 2018-08-17 (×3)

## 2018-08-13 MED ORDER — WARFARIN SODIUM 2.5 MG PO TABS
2.5000 mg | ORAL_TABLET | Freq: Once | ORAL | Status: AC
  Administered 2018-08-13: 17:00:00 2.5 mg via ORAL
  Filled 2018-08-13: qty 1

## 2018-08-13 MED ORDER — POLYETHYLENE GLYCOL 3350 17 G PO PACK
17.0000 g | PACK | Freq: Every day | ORAL | Status: DC
  Administered 2018-08-13 – 2018-08-17 (×5): 17 g via ORAL
  Filled 2018-08-13 (×5): qty 1

## 2018-08-13 NOTE — Progress Notes (Signed)
[place] for Warfarin Indication: DVT, hx Factor V Leiden deficiency  Allergies  Allergen Reactions  . Codeine Nausea Only  . Erythromycin Nausea And Vomiting   Patient Measurements: Weight: 137 lb 12.6 oz (62.5 kg)  Vital Signs: Temp: 98 F (36.7 C) (06/22 0539) Temp Source: Oral (06/22 0539) BP: 126/68 (06/22 0539) Pulse Rate: 95 (06/22 0539)  Labs: Recent Labs    08/10/18 2006  08/11/18 0356 08/12/18 0306 08/13/18 0247  HGB 12.6   < > 11.3* 9.6* 10.5*  HCT 39.3   < > 34.8* 29.7* 32.4*  PLT 119*   < > 131* 104* 114*  APTT 47*  --   --   --   --   LABPROT 30.8*  --  31.0* 40.7* 21.8*  INR 3.0*  --  3.0* 4.3* 1.9*  CREATININE 0.92  --  0.85 0.84 0.89   < > = values in this interval not displayed.   Estimated Creatinine Clearance: 42.4 mL/min (by C-G formula based on SCr of 0.89 mg/dL).  Medical History: Past Medical History:  Diagnosis Date  . Allergic rhinitis   . Cough   . Difficulty in swallowing    w/ occasional aspiration  . DVT (deep venous thrombosis) ([place])   . Dyslipidemia   . Factor V Leiden deficiency    lifelong coumadin  . GERD (gastroesophageal reflux disease)   . History of nuclear stress test 12/28/2007   lexiscan; low risk   . HTN (hypertension)   . PND (post-nasal drip)   . Pulmonary embolism ([place])   . PVC's (premature ventricular contractions)   . Thyroid disease    Medications:  Scheduled:  . acetaminophen  1,000 mg Oral TID  . atorvastatin  10 mg Oral q1800  . brimonidine  1 drop Both Eyes BID  . fentaNYL (SUBLIMAZE) injection  25 mcg Intravenous Once  . latanoprost  1 drop Both Eyes QHS  . levothyroxine  25 mcg Oral QAC breakfast  . losartan  50 mg Oral Daily  . metoprolol tartrate  25 mg Oral BID    Assessment:  84 yoF admit 6/19 after fall at church, R flank pain, rib fx > hematuria - but no renal injury per CT  PMH: hx DVT/PE, Factor V Leiden on Warf 3.75mg  MWF, 7.5mg  other days, LD  6/18, dCHF, HTN, hypoThyroid, HPL, chronic thrombocytopenia  Goal of Therapy:  INR 2-3 Monitor platelets by anticoagulation protocol: Yes   Today, 08/13/2018 INR this am = 1.9, decreased from 4.3 on 6/21, VitK 2.5mg  po given. Hgb 10.5, Plt 114 (chronically low) Po intake decreased from admit   Plan:  Warfarin 2.5mg  today at 1800 Daily INR (ordered x 4, last 6/23) Monitor CBC, s/s bleed  Minda Ditto PharmD [phone] 08/13/2018,7:48 AM

## 2018-08-13 NOTE — Progress Notes (Signed)
Progress Note    Robin Harrell  YIR:[id number] DOB: Jul 12, 1933  DOA: 08/10/2018 PCP: [name], MD    Brief Narrative:     Medical records reviewed and are as summarized below:  Robin Harrell is an 85 y.o. 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  Patient reports that she been in her usual state of health and was having an uneventful day when she was at church this evening and tripped, striking her right side against the corner of a pew.  She did not hit her head or lose consciousness.  Assessment/Plan:   Principal Problem:   Multiple rib fractures, right 10-12 Active Problems:   Essential hypertension   History of DVT (deep vein thrombosis)   GERD   Long term (current) use of anticoagulants   Hyperlipidemia   Chronic idiopathic thrombocytopenia (HCC)   Diastolic dysfunction   Factor V deficiency (HCC)   History of respiratory failure   Hypothyroidism (acquired)   Insomnia   Osteoarthritis, multiple sites   Osteoporosis   History of recurrent UTIs   Aortic stenosis, mild   Gross hematuria   Traumatic hematoma of flank   Fall against sharp object  1. Fall with multiple rib fractures; L1 transverse process fracture   - Presents with right flank pain after a mechanical fall without hitting head or losing consciousness  - She is found to have fractures of ribs 10-12 on the right, as well as L1 transverse process fracture  - Continue pain-control, incentive spirometry -PT re-eval to decide either SNF vs home health -trauma surgery consult appreciated  2. Gross hematuria  - Patient developed gross hematuria after the fall  - There is no injury to the kidneys, ureter, or bladder identified on CT  - urine appears to be lightening up  3. Factor V deficiency; history  DVT: chronic anticoagulation  - No evidence for acute VTE, INR is 3.0 on admission  - INR 1.9 after small dose of vit K-- resume coumadin  4. Thrombocytopenia  - Platelets chronically low -trend  5. Hypertension  - losartan, and Lopressor   6. Hypothyroidism  - Continue Synthroid    7. Chronic diastolic CHF  - Appears compensated   8.  Anemia -concern for ABLA (hematuria/bruising) -H/H in AM  Family Communication/Anticipated D/C date and plan/Code Status   DVT prophylaxis: coumadin held (INR therapeutic) Code Status: Full Code.  Family Communication: Disposition Plan: home once INR stable, Hgb stable and d/c planning complete for PT vs SNF   Medical Consultants:    trauma  Subjective:   Says she does not feel well  Objective:    Vitals:   08/13/18 0500 08/13/18 0539 08/13/18 0915 08/13/18 1337  BP:  126/68 125/68 (!) 131/93  Pulse:  95 78 74  Resp:  17 18 16   Temp:  98 F (36.7 C) 97.9 F (36.6 C) 97.7 F (36.5 C)  TempSrc:  Oral Oral Oral  SpO2:  98% 94% 97%  Weight: 62.5 kg       Intake/Output Summary (Last 24 hours) at 08/13/2018 1437 Last data filed at 08/13/2018 1300 Gross per 24 hour  Intake 680 ml  Output 2000 ml  Net -1320 ml   Filed Weights   08/11/18 0500 08/12/18 0500 08/13/18 0500  Weight: 63.5 kg 63.8 kg 62.5 kg  Exam: In bed, wash cloth over face rrr +BS ,soft No increased work of breathing Flank ecchymosis  Data Reviewed:   I have personally reviewed following labs and imaging studies:  Labs: Labs show the following:   Basic Metabolic Panel: Recent Labs  Lab 08/10/18 2006 08/11/18 0356 08/12/18 0306 08/13/18 0247  NA 139 136 137 138  K 4.3 4.1 4.0 4.3  CL 103 105 105 106  CO2 25 24 26 25   GLUCOSE 116* 111* 85 108*  BUN 21 15 15 15   CREATININE 0.92 0.85 0.84 0.89  CALCIUM 9.9 8.8* 8.4* 8.7*   GFR Estimated Creatinine Clearance: 42.4 mL/min (by C-G formula based on SCr of 0.89 mg/dL). Liver Function  Tests: Recent Labs  Lab 08/10/18 2006  AST 32  ALT 21  ALKPHOS 62  BILITOT 0.7  PROT 7.5  ALBUMIN 4.3   Recent Labs  Lab 08/10/18 2006  LIPASE 33   No results for input(s): AMMONIA in the last 168 hours. Coagulation profile Recent Labs  Lab 08/10/18 2006 08/11/18 0356 08/12/18 0306 08/13/18 0247  INR 3.0* 3.0* 4.3* 1.9*    CBC: Recent Labs  Lab 08/10/18 2006 08/11/18 0047 08/11/18 0356 08/12/18 0306 08/13/18 0247  WBC 5.1 9.5 8.1 5.7 7.0  NEUTROABS 3.4  --   --   --   --   HGB 12.6 11.8* 11.3* 9.6* 10.5*  HCT 39.3 36.9 34.8* 29.7* 32.4*  MCV 96.6 96.1 94.1 94.3 94.2  PLT 119* 114* 131* 104* 114*   Cardiac Enzymes: No results for input(s): CKTOTAL, CKMB, CKMBINDEX, TROPONINI in the last 168 hours. BNP (last 3 results) No results for input(s): PROBNP in the last 8760 hours. CBG: Recent Labs  Lab 08/11/18 0748 08/12/18 0745 08/13/18 0835  GLUCAP 101* 104* 99   D-Dimer: No results for input(s): DDIMER in the last 72 hours. Hgb A1c: No results for input(s): HGBA1C in the last 72 hours. Lipid Profile: No results for input(s): CHOL, HDL, LDLCALC, TRIG, CHOLHDL, LDLDIRECT in the last 72 hours. Thyroid function studies: No results for input(s): TSH, T4TOTAL, T3FREE, THYROIDAB in the last 72 hours.  Invalid input(s): FREET3 Anemia work up: No results for input(s): VITAMINB12, FOLATE, FERRITIN, TIBC, IRON, RETICCTPCT in the last 72 hours. Sepsis Labs: Recent Labs  Lab 08/11/18 0047 08/11/18 0356 08/12/18 0306 08/13/18 0247  WBC 9.5 8.1 5.7 7.0    Microbiology Recent Results (from the past 240 hour(s))  SARS Coronavirus 2 (CEPHEID - Performed in [place] hospital lab), Hosp Order     Status: None   Collection Time: 08/10/18 10:33 PM   Specimen: Nasopharyngeal Swab  Result Value Ref Range Status   SARS Coronavirus 2 NEGATIVE NEGATIVE Final    Comment: (NOTE) If result is NEGATIVE SARS-CoV-2 target nucleic acids are NOT DETECTED. The  SARS-CoV-2 RNA is generally detectable in upper and lower  respiratory specimens during the acute phase of infection. The lowest  concentration of SARS-CoV-2 viral copies this assay can detect is 250  copies / mL. A negative result does not preclude SARS-CoV-2 infection  and should not be used as the sole basis for treatment or other  patient management decisions.  A negative result may occur with  improper specimen collection / handling, submission of specimen other  than nasopharyngeal swab, presence of viral mutation(s) within the  areas targeted by this assay, and inadequate number of viral copies  (<250 copies / mL). A negative result must be combined with clinical  observations, patient history, and epidemiological  information. If result is POSITIVE SARS-CoV-2 target nucleic acids are DETECTED. The SARS-CoV-2 RNA is generally detectable in upper and lower  respiratory specimens dur ing the acute phase of infection.  Positive  results are indicative of active infection with SARS-CoV-2.  Clinical  correlation with patient history and other diagnostic information is  necessary to determine patient infection status.  Positive results do  not rule out bacterial infection or co-infection with other viruses. If result is PRESUMPTIVE POSTIVE SARS-CoV-2 nucleic acids MAY BE PRESENT.   A presumptive positive result was obtained on the submitted specimen  and confirmed on repeat testing.  While 2019 novel coronavirus  (SARS-CoV-2) nucleic acids may be present in the submitted sample  additional confirmatory testing may be necessary for epidemiological  and / or clinical management purposes  to differentiate between  SARS-CoV-2 and other Sarbecovirus currently known to infect humans.  If clinically indicated additional testing with an alternate test  methodology [phone]) is advised. The SARS-CoV-2 RNA is generally  detectable in upper and lower respiratory sp ecimens during the acute   phase of infection. The expected result is Negative. Fact Sheet for Patients:  StrictlyIdeas.no Fact Sheet for Healthcare Providers: BankingDealers.co.za This test is not yet approved or cleared by the [place] FDA and has been authorized for detection and/or diagnosis of SARS-CoV-2 by FDA under an Emergency Use Authorization (EUA).  This EUA will remain in effect (meaning this test can be used) for the duration of the COVID-19 declaration under Section 564(b)(1) of the Act, 21 U.S.C. section 360bbb-3(b)(1), unless the authorization is terminated or revoked sooner. Performed at [facility], [place] [address] [place], [place] [address]     Procedures and diagnostic studies:  Dg Chest 2 View  Result Date: 08/12/2018 CLINICAL DATA:  85 y/o  F; multiple rib fractures. EXAM: CHEST - 2 VIEW COMPARISON:  08/11/2018 chest radiograph. 08/10/2018 chest CT. FINDINGS: Stable cardiac silhouette within normal limits given projection and technique. Aortic calcific atherosclerosis. Right 10-12 rib fractures better characterized on prior CT. No new acute fracture identified. Blunting of costal diaphragmatic angles, probably small effusions. Ill-defined bibasilar opacities. No pneumothorax. IMPRESSION: 1. Small bilateral effusions and ill-defined bibasilar opacities probably represent associated atelectasis. 2. Right 10-12 rib fractures better characterized on prior CT. No new acute fracture identified. Electronically Signed   By: Kristine Garbe M.D.   On: 08/12/2018 06:48    Medications:   . acetaminophen  1,000 mg Oral TID  . atorvastatin  10 mg Oral q1800  . brimonidine  1 drop Both Eyes BID  . docusate sodium  100 mg Oral BID  . latanoprost  1 drop Both Eyes QHS  . levothyroxine  25 mcg Oral QAC breakfast  . losartan  50 mg Oral Daily  . metoprolol tartrate  25 mg Oral BID  . polyethylene glycol  17 g Oral Daily  .  warfarin  2.5 mg Oral ONCE-1800  . Warfarin - Pharmacist Dosing Inpatient   Does not apply q1800   Continuous Infusions:   LOS: 2 days   Geradine Girt  [facility]   How to contact the [facility] Attending or Consulting provider [place] or covering provider during after hours [place], for this patient?  1. Check the care team in [facility] and look for a) attending/consulting TRH provider listed and b) the [facility] team listed 2. Log into [email or web address] and use [place]'s universal password to access. If you do not have the password, please contact  the hospital operator. 3. Locate the [facility] provider you are looking for under [facility] and page to a number that you can be directly reached. 4. If you still have difficulty reaching the provider, please page the [facility] (Director on Call) for the Hospitalists listed on amion for assistance.  08/13/2018, 2:37 PM

## 2018-08-13 NOTE — TOC Progression Note (Signed)
Transition of Care [facility]) - Progression Note    Patient Details  Name: Robin Harrell MRN: [id number] Date of Birth: 02/04/1934  Transition of [facility]) CM/SW Contact  Jacalyn Lefevre Edson Snowball, RN Phone Number: 08/13/2018, 12:01 PM  Clinical Narrative:     Damaris Schooner to daughter [name]. Patient has four daughters, at present they would like to take their mother mother with home health and family will arrange 24 hour care through [facility] or [facility] [employer]. However, if their mother needs 24 hour care and 2 person assist , they would be interested in SNF. Rod Holler with PT will call daughter after PT session today.   [name] [phone]  Expected Discharge Plan: [place] Barriers to Discharge: Continued Medical Work up  Expected Discharge Plan and Services Expected Discharge Plan: [place] In-house Referral: Clinical Social Work Discharge Planning Services: CM Consult Post Acute Care Choice: Home Health, Durable Medical Equipment Living arrangements for the past 2 months: Single Family Home Expected Discharge Date: 08/15/18                                     Social Determinants of Health (SDOH) Interventions    Readmission Risk Interventions No flowsheet data found.

## 2018-08-13 NOTE — Progress Notes (Signed)
PT Cancellation Note  Patient Details Name: Robin Harrell MRN: [id number] DOB: 12-Feb-1934   Cancelled Treatment:    Reason Eval/Treat Not Completed: Other (comment).  Pt had just gotten up with restorative nursing to walk and was not able to work with PT for feeling painful, tired and nauseated.  Will try again at another time.   Ramond Dial 08/13/2018, 1:03 PM  Mee Hives, PT MS Acute Rehab Dept. Number: [place]

## 2018-08-13 NOTE — Progress Notes (Signed)
[facility] Progress Note     Subjective: CC-  States that she is not feeling quite as well as she was yesterday. No appetite this morning and she felt a little nauseated, no emesis. Some bloating. Passing flatus but no BM since admission. Typically has a bowel movement at least once daily.  Continues to have some right sided abdominal/chest pain. Tylenol and tramadol help make the pain manageable.  Denies SOB or cough. Pulling [phone] on IS. Urine still dark. Mild dysuria.  Therapies recommending HH PT/OT at discharge.  Patient lives with her husband but reports that her daughters will be assisting after discharge.  Objective: Vital signs in last 24 hours: Temp:  [97.7 F (36.5 C)-98.4 F (36.9 C)] 97.9 F (36.6 C) (06/22 0915) Pulse Rate:  [75-96] 78 (06/22 0915) Resp:  [16-18] 18 (06/22 0915) BP: (124-133)/(59-68) 125/68 (06/22 0915) SpO2:  [94 %-99 %] 94 % (06/22 0915) Weight:  [62.5 kg] 62.5 kg (06/22 0500) Last BM Date: 08/10/18  Intake/Output from previous day: 06/21 0701 - 06/22 0700 In: 562 [P.O.:562] Out: 2275 [Urine:2275] Intake/Output this shift: Total I/O In: 200 [P.O.:200] Out: 800 [Urine:800]  PE: Gen:  Alert, NAD, pleasant HEENT: EOM's intact, pupils equal and round Card:  RRR Pulm:  CTAB, no W/R/R, effort normal Abd: Soft, mild distension, no abdominal TTP, +BS, no HSM, diffuse R flank ecchymosis Ext: No BLE edema Psych: A&O Neuro: moving all 4 extremities  Skin: no rashes noted, warm and dry  Lab Results:  Recent Labs    08/12/18 0306 08/13/18 0247  WBC 5.7 7.0  HGB 9.6* 10.5*  HCT 29.7* 32.4*  PLT 104* 114*   BMET Recent Labs    08/12/18 0306 08/13/18 0247  NA 137 138  K 4.0 4.3  CL 105 106  CO2 26 25  GLUCOSE 85 108*  BUN 15 15  CREATININE 0.84 0.89  CALCIUM 8.4* 8.7*   PT/INR Recent Labs    08/12/18 0306 08/13/18 0247  LABPROT 40.7* 21.8*  INR 4.3* 1.9*   CMP     Component Value Date/Time   NA 138  08/13/2018 0247   K 4.3 08/13/2018 0247   CL 106 08/13/2018 0247   CO2 25 08/13/2018 0247   GLUCOSE 108 (H) 08/13/2018 0247   BUN 15 08/13/2018 0247   CREATININE 0.89 08/13/2018 0247   CALCIUM 8.7 (L) 08/13/2018 0247   PROT 7.5 08/10/2018 2006   ALBUMIN 4.3 08/10/2018 2006   AST 32 08/10/2018 2006   ALT 21 08/10/2018 2006   ALKPHOS 62 08/10/2018 2006   BILITOT 0.7 08/10/2018 2006   GFRNONAA >60 08/13/2018 0247   GFRAA >60 08/13/2018 0247   Lipase     Component Value Date/Time   LIPASE 33 08/10/2018 2006       Studies/Results: Dg Chest 2 View  Result Date: 08/12/2018 CLINICAL DATA:  85 y/o  F; multiple rib fractures. EXAM: CHEST - 2 VIEW COMPARISON:  08/11/2018 chest radiograph. 08/10/2018 chest CT. FINDINGS: Stable cardiac silhouette within normal limits given projection and technique. Aortic calcific atherosclerosis. Right 10-12 rib fractures better characterized on prior CT. No new acute fracture identified. Blunting of costal diaphragmatic angles, probably small effusions. Ill-defined bibasilar opacities. No pneumothorax. IMPRESSION: 1. Small bilateral effusions and ill-defined bibasilar opacities probably represent associated atelectasis. 2. Right 10-12 rib fractures better characterized on prior CT. No new acute fracture identified. Electronically Signed   By: Kristine Garbe M.D.   On: 08/12/2018 06:48    Anti-infectives: Anti-infectives (From  admission, onward)   None       Assessment/Plan Fall  R rib fxs 10-12 - f/u CXR w/o PNX. pain control and pulmonary toilet L1 TVP fx - pain control R flank contusion - alternate ice and heat Anemia - Hg 10.5 from 9.6, stable Hematuria - no injury to the kidneys, ureter, or bladder identified on CT. primary checking u/a Factor V deficiency with h/o DVT on warfarin - INR 1.9 Chronic diastolic CHF HTN Hypothyroidism  ID - none FEN - regular diet VTE - SCDs Foley - none Follow up - PCP  Plan - Add  miralax/colace for constipation. Pain controlled on tylenol, tramadol, ice/heat. Home per primary service. Trauma will sign off, please call with any concerns. Follow up with PCP.   LOS: 2 days    Wellington [place] , [facility] 08/13/2018, 10:55 AM Pager: [phone] Mon-Thurs 7:00 am-4:30 pm Fri 7:00 am -11:30 AM Sat-Sun 7:00 am-11:30 am

## 2018-08-13 NOTE — Evaluation (Signed)
Physical Therapy Evaluation Patient Details Name: Robin Harrell MRN: [id number] DOB: Jun 27, 1933 Today's Date: 08/13/2018   History of Present Illness  85 y/o female presenting to ED after fall and R flank pain. Imaging reveals: right rib fractures 10 through 12 and associated subcutaneous contusion, as well as nondisplaced right L1 transverse process fracture.  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   Clinical Impression  Pt is up to walk with PT and note her incontinence with WB movement.  Her plan is to progress through balance and strength treatments, to increase standing endurance and control of transition to sit and stand to avoid overstraining her rib pain and avoid fall risk.  Follow acutely for same.    Follow Up Recommendations Home health PT;Supervision for mobility/OOB    Equipment Recommendations  Rolling walker with 5" wheels    Recommendations for Other Services       Precautions / Restrictions Precautions Precautions: Fall Precaution Comments: hematuria Required Braces or Orthoses: (on purwick) Restrictions Weight Bearing Restrictions: No      Mobility  Bed Mobility               General bed mobility comments: up in chair when PT arrived  Transfers Overall transfer level: Needs assistance Equipment used: Rolling walker (2 wheeled) Transfers: Sit to/from Stand Sit to Stand: Min assist         General transfer comment: cueing for hand placement, safety and min power up to stand   Ambulation/Gait Ambulation/Gait assistance: Min assist Gait Distance (Feet): 30 Feet Assistive device: Rolling walker (2 wheeled) Gait Pattern/deviations: Step-through pattern;Wide base of support;Trunk flexed;Shuffle;Decreased stride length     General Gait Details: on room air but is able to maintain O2 sats with all effort  Stairs            Wheelchair Mobility    Modified  [place] (Stroke Patients Only)       Balance Overall balance assessment: Needs assistance Sitting-balance support: Feet supported Sitting balance-Leahy Scale: Good     Standing balance support: Bilateral upper extremity supported;During functional activity Standing balance-Leahy Scale: Fair Standing balance comment: used walker to control balace with listing forward, standing with min assist to min guard for balance                             Pertinent Vitals/Pain Pain Assessment: Faces Faces Pain Scale: Hurts even more Pain Location: R ribs Pain Descriptors / Indicators: Grimacing Pain Intervention(s): Limited activity within patient's tolerance;Monitored during session;Premedicated before session;Repositioned    Home Living                        Prior Function                 Hand Dominance        Extremity/Trunk Assessment                Communication      Cognition Arousal/Alertness: Awake/alert Behavior During Therapy: WFL for tasks assessed/performed Overall Cognitive Status: Within Functional Limits for tasks assessed                                        General Comments      Exercises  Assessment/Plan    PT Assessment    PT Problem List         PT Treatment Interventions      PT Goals (Current goals can be found in the Care Plan section)  Acute Rehab PT Goals Patient Stated Goal: to get back home     Frequency Min 3X/week   Barriers to discharge        Co-evaluation               AM-PAC PT "6 Clicks" Mobility  Outcome Measure Help needed turning from your back to your side while in a flat bed without using bedrails?: A Little Help needed moving from lying on your back to sitting on the side of a flat bed without using bedrails?: A Little Help needed moving to and from a bed to a chair (including a wheelchair)?: A Little Help needed standing up from a chair using your arms  (e.g., wheelchair or bedside chair)?: A Little Help needed to walk in hospital room?: A Little Help needed climbing 3-5 steps with a railing? : A Lot 6 Click Score: 17    End of Session Equipment Utilized During Treatment: Gait belt Activity Tolerance: Patient limited by fatigue Patient left: in chair;with call bell/phone within reach;with chair alarm set;with nursing/sitter in room Nurse Communication: Mobility status PT Visit Diagnosis: Other abnormalities of gait and mobility (R26.89);Unsteadiness on feet (R26.81);Pain;History of falling (Z91.81) Pain - Right/Left: Right Pain - part of body: (ribs)    Time: 5615-3794 PT Time Calculation (min) (ACUTE ONLY): 21 min   Charges:     PT Treatments $Gait Training: 8-22 mins       Ramond Dial 08/13/2018, 2:54 PM  Mee Hives, PT MS Acute Rehab Dept. Number: [place]

## 2018-08-13 NOTE — Progress Notes (Signed)
Pt c/o being nauseous and vomited x 1 small amount of food emesis. Zofran 4mg  IVP given. Will continue to monitor.

## 2018-08-13 NOTE — Care Management Important Message (Signed)
Important Message  Patient Details  Name: Robin Harrell MRN: [id number] Date of Birth: 03-13-1933   Medicare Important Message Given:  Yes     Memory Argue 08/13/2018, 4:06 PM

## 2018-08-13 NOTE — Plan of Care (Signed)
  Problem: Clinical Measurements: Goal: Ability to maintain clinical measurements within normal limits will improve Outcome: Progressing   Problem: Activity: Goal: Risk for activity intolerance will decrease Outcome: Progressing   Problem: Pain Managment: Goal: General experience of comfort will improve Outcome: Progressing   

## 2018-08-14 ENCOUNTER — Ambulatory Visit: Payer: Medicare Other | Admitting: Podiatry

## 2018-08-14 DIAGNOSIS — R319 Hematuria, unspecified: Secondary | ICD-10-CM

## 2018-08-14 LAB — PROTIME-INR
INR: 1.2 (ref 0.8–1.2)
Prothrombin Time: 15.1 seconds (ref 11.4–15.2)

## 2018-08-14 LAB — CBC
HCT: 31.5 % — ABNORMAL LOW (ref 36.0–46.0)
Hemoglobin: 10.5 g/dL — ABNORMAL LOW (ref 12.0–15.0)
MCH: 31.1 pg (ref 26.0–34.0)
MCHC: 33.3 g/dL (ref 30.0–36.0)
MCV: 93.2 fL (ref 80.0–100.0)
Platelets: 119 10*3/uL — ABNORMAL LOW (ref 150–400)
RBC: 3.38 MIL/uL — ABNORMAL LOW (ref 3.87–5.11)
RDW: 12.9 % (ref 11.5–15.5)
WBC: 6.4 10*3/uL (ref 4.0–10.5)
nRBC: 0 % (ref 0.0–0.2)

## 2018-08-14 LAB — GLUCOSE, CAPILLARY: Glucose-Capillary: 84 mg/dL (ref 70–99)

## 2018-08-14 MED ORDER — HEPARIN (PORCINE) 25000 UT/250ML-% IV SOLN
900.0000 [IU]/h | INTRAVENOUS | Status: DC
  Filled 2018-08-14: qty 250

## 2018-08-14 MED ORDER — WARFARIN SODIUM 5 MG PO TABS: 5.0000 mg | ORAL_TABLET | Freq: Once | ORAL | Status: DC

## 2018-08-14 MED ORDER — BISACODYL 10 MG RE SUPP
10.0000 mg | Freq: Once | RECTAL | Status: AC
  Administered 2018-08-14: 10 mg via RECTAL
  Filled 2018-08-14: qty 1

## 2018-08-14 MED ORDER — WARFARIN SODIUM 5 MG PO TABS
5.0000 mg | ORAL_TABLET | Freq: Once | ORAL | Status: AC
  Administered 2018-08-14: 5 mg via ORAL
  Filled 2018-08-14: qty 1

## 2018-08-14 NOTE — Progress Notes (Signed)
Occupational Therapy Treatment Patient Details Name: Robin Harrell MRN: [id number] DOB: Nov 22, 1933 Today's Date: 08/14/2018    History of present illness 85 y/o female presenting to ED after fall and R flank pain. Imaging reveals: right rib fractures 10 through 12 and associated subcutaneous contusion, as well as nondisplaced right L1 transverse process fracture.  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    OT comments  Pt up in recliner and agreeable to participate in OT treatment session. Patient completed BUE strengthening with red theraband to increase functional transfers and functional performance during ADL tasks. Patient was provided with band to use on her own. Education provided verbally to BUE strengthening exercises to complete. Education given to hug her pillow when coughing to decrease rib pain. Pt requested to walk in hallway using RW. No LOB noted with supervision provided. VC for standing posture during ambulation. Pt left in recliner with call light within reach. OT will continue to follow patient acutely.   Follow Up Recommendations  Home health OT;Supervision/Assistance - 24 hour    Equipment Recommendations  3 in 1 bedside commode       Precautions / Restrictions Precautions Precautions: Fall Precaution Comments: hematuria Required Braces or Orthoses: (purwick) Restrictions Weight Bearing Restrictions: No       Mobility    Transfers Overall transfer level: Needs assistance Equipment used: Rolling walker (2 wheeled) Transfers: Sit to/from [facility] Sit to Stand: Min assist Stand pivot transfers: Supervision       General transfer comment: Min assist to power up only. Supervision for ambulation in room and in hallway using RW.    Balance Overall balance assessment: Needs assistance Sitting-balance support: Feet supported Sitting balance-Leahy Scale: Good     Standing balance support: Bilateral upper extremity supported;During functional activity Standing balance-Leahy Scale: Fair         ADL either performed or assessed with clinical judgement        Vision Baseline Vision/History: No visual deficits Patient Visual Report: No change from baseline            Cognition Arousal/Alertness: Awake/alert Behavior During Therapy: WFL for tasks assessed/performed Overall Cognitive Status: Within Functional Limits for tasks assessed            Exercises Exercises: General Lower Extremity General Exercises - Lower Extremity Ankle Circles/Pumps: AROM;AAROM;Both;5 reps Quad Sets: AROM;Both;10 reps Heel Slides: AROM;Both;10 reps Hip ABduction/ADduction: AROM;Both;10 reps Straight Leg Raises: AROM;Both;10 reps Shoulder Exercises Shoulder Flexion: Strengthening;Both;10 reps;Theraband;Seated Theraband Level (Shoulder Flexion): Level 2 (Red) Shoulder External Rotation: Strengthening;Both;10 reps;Theraband;Seated Theraband Level (Shoulder External Rotation): Level 2 (Red) Elbow Flexion: Strengthening;Both;10 reps;Theraband;Seated Theraband Level (Elbow Flexion): Level 2 (Red) Elbow Extension: Strengthening;Both;10 reps;Seated;Theraband Theraband Level (Elbow Extension): Level 2 (Red) Other Exercises Other Exercises: shoulder horizontal abduction; red band, 10X, Bilateral UE, seated Other Exercises: shoulder IR/er, red band, 10X, Bilateral UE, seated Other Exercises: Education provided to use pillow and hug when coughing to decrease rib pain. patient was able to demonstrate successfully with report of less pain.      General Comments pt is getting up to walk on RW with PT for BR, transfers, to maneuver in room but have to cue turns on walker every trial to sit    Pertinent Vitals/ Pain       Pain Assessment: Faces Faces Pain Scale: Hurts a little bit Pain Location: right ribs Pain Descriptors / Indicators: Grimacing Pain  Intervention(s): Monitored during session;Limited activity  within patient's tolerance         Frequency  Min 2X/week        Progress Toward Goals  OT Goals(current goals can now be found in the care plan section)  Progress towards OT goals: Progressing toward goals  Acute Rehab OT Goals Patient Stated Goal: to get back home   Plan Discharge plan remains appropriate;Frequency remains appropriate       AM-PAC OT "6 Clicks" Daily Activity     Outcome Measure   Help from another person eating meals?: None Help from another person taking care of personal grooming?: A Little Help from another person toileting, which includes using toliet, bedpan, or urinal?: A Little Help from another person bathing (including washing, rinsing, drying)?: A Little Help from another person to put on and taking off regular upper body clothing?: A Little Help from another person to put on and taking off regular lower body clothing?: A Little 6 Click Score: 19    End of Session Equipment Utilized During Treatment: Rolling walker;Gait belt  OT Visit Diagnosis: Other abnormalities of gait and mobility (R26.89);Muscle weakness (generalized) (M62.81);Pain   Activity Tolerance Patient tolerated treatment well   Patient Left in chair;with call bell/phone within reach   Nurse Communication Mobility status        Time: 3524-8185 OT Time Calculation (min): 30 min  Charges: OT General Charges $OT Visit: 1 Visit OT Treatments $Therapeutic Exercise: 23-37 mins  Ailene Ravel, OTR/L,CBIS  [phone]    Essenmacher, Clarene Duke 08/14/2018, 5:04 PM

## 2018-08-14 NOTE — Progress Notes (Signed)
[facility] for Warfarin & Heparin bridge Indication: DVT, hx Factor V Leiden deficiency  Allergies  Allergen Reactions  . Codeine Nausea Only  . Erythromycin Nausea And Vomiting   Patient Measurements: Weight: 131 lb 9.8 oz (59.7 kg)  Vital Signs: Temp: 98.4 F (36.9 C) (06/23 0519) Temp Source: Oral (06/23 0519) BP: 129/67 (06/23 0519) Pulse Rate: 83 (06/23 0519)  Labs: Recent Labs    08/12/18 0306 08/13/18 0247 08/14/18 0329  HGB 9.6* 10.5* 10.5*  HCT 29.7* 32.4* 31.5*  PLT 104* 114* 119*  LABPROT 40.7* 21.8* 15.1  INR 4.3* 1.9* 1.2  CREATININE 0.84 0.89  --    Estimated Creatinine Clearance: 38.5 mL/min (by C-G formula based on SCr of 0.89 mg/dL).  Medical History: Past Medical History:  Diagnosis Date  . Allergic rhinitis   . Cough   . Difficulty in swallowing    w/ occasional aspiration  . DVT (deep venous thrombosis) ([place])   . Dyslipidemia   . Factor V Leiden deficiency    lifelong coumadin  . GERD (gastroesophageal reflux disease)   . History of nuclear stress test 12/28/2007   lexiscan; low risk   . HTN (hypertension)   . PND (post-nasal drip)   . Pulmonary embolism ([employer])   . PVC's (premature ventricular contractions)   . Thyroid disease    Medications:  Scheduled:  . acetaminophen  1,000 mg Oral TID  . atorvastatin  10 mg Oral q1800  . brimonidine  1 drop Both Eyes BID  . docusate sodium  100 mg Oral BID  . latanoprost  1 drop Both Eyes QHS  . levothyroxine  25 mcg Oral QAC breakfast  . losartan  50 mg Oral Daily  . metoprolol tartrate  25 mg Oral BID  . polyethylene glycol  17 g Oral Daily  . Warfarin - Pharmacist Dosing Inpatient   Does not apply q1800   Assessment:  69 yoF admit 6/19 after fall at church, R flank pain, rib fx > hematuria - but no renal injury per CT  PMH: hx DVT/PE, Factor V Leiden on Warf 3.75mg  MWF, 7.5mg  other days, LD 6/18, dCHF, HTN, hypoThyroid, HPL, chronic  thrombocytopenia  Goal of Therapy:  INR 2-3 Monitor platelets by anticoagulation protocol: Yes   Today, 08/14/2018 INR this am = 1.2 6/21 INR = 4.3, VitK 2.5mg  po given >> 1.9 (6/22) Hgb 10.5, Plt 119 (chronically low) Po intake decreased from admit Add IV Heparin for bridging to therapeutic INR   Plan:  Heparin infusion, begin at 900 units/hr, no loading dose 1st Heparin level, check in 8 hr Warfarin 5mg  today at 1800 Daily INR (ordered x 4, last 6/23) Monitor CBC, s/s bleed  Minda Ditto PharmD [phone] 08/14/2018,8:06 AM

## 2018-08-14 NOTE — Progress Notes (Addendum)
Progress Note    Robin Harrell  RDE:[id number] DOB: [birth year]  DOA: 08/10/2018 PCP: [name], MD    Brief Narrative:     Medical records reviewed and are as summarized below:  Robin Harrell is an 85 y.o. 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  Found to have rib fractures and a contusion to her kidney causing hematuria.    Assessment/Plan:   Principal Problem:   Multiple rib fractures, right 10-12 Active Problems:   Essential hypertension   History of DVT (deep vein thrombosis)   GERD   Long term (current) use of anticoagulants   Hyperlipidemia   Chronic idiopathic thrombocytopenia (HCC)   Diastolic dysfunction   Factor V deficiency (HCC)   History of respiratory failure   Hypothyroidism (acquired)   Insomnia   Osteoarthritis, multiple sites   Osteoporosis   History of recurrent UTIs   Aortic stenosis, mild   Gross hematuria   Traumatic hematoma of flank   Fall against sharp object  1. Fall with multiple rib fractures; L1 transverse process fracture   - Presents with right flank pain after a mechanical fall without hitting head or losing consciousness  - She is found to have fractures of ribs 10-12 on the right, as well as L1 transverse process fracture  - Continue pain-control, incentive spirometry -PT: home health -trauma surgery consult appreciated  2. Gross hematuria due to kidney contusion - Patient developed gross hematuria after the fall  - There is no injury to the kidneys, ureter, or bladder identified on CT- but kidney appears to have a contusion  Discussed with on-call urology-- needs to be off anticoagulation until hematuria resolves-- hopefully 24-48 hours-- can resume heparin/coumadin once this happens.-- if further assistance is needed, they  will be happy to consult. -difficult situation in light of factor V leiden  3. Factor V deficiency; history DVT: chronic anticoagulation  - No evidence for acute VTE, INR is 3.0 on admission  - INR decreased after small dose of vit K -resume coumadin/heparin once hematuria resolved-- hopefully in 24-48 hours -SCDs/ambulate frequently  4. Thrombocytopenia  - Platelets chronically low -trend  5. Hypertension  - losartan, and Lopressor   6. Hypothyroidism  - Continue Synthroid    7. Chronic diastolic CHF  - Appears compensated   8.  Anemia -concern for ABLA (hematuria/bruising) -daily cbc  9. UTI -culture pending -U/A with bacteria -patient symptomatic -IV Rocephin  Family Communication/Anticipated D/C date and plan/Code Status   DVT prophylaxis: coumadin held (INR therapeutic) Code Status: Full Code.  Family Communication: called daughter Disposition Plan:   Medical Consultants:    Trauma  Urology (phone only-- call back if needed)  Subjective:   Some nausea last PM Feeling better today  Objective:    Vitals:   08/13/18 0915 08/13/18 1337 08/13/18 2051 08/14/18 0519  BP: 125/68 (!) 131/93 139/72 129/67  Pulse: 78 74 88 83  Resp: 18 16 16 18   Temp: 97.9 F (36.6 C) 97.7 F (36.5 C) 98.2 F (36.8 C) 98.4 F (36.9 C)  TempSrc: Oral Oral Oral Oral  SpO2: 94% 97% 98% 96%  Weight:    59.7 kg    Intake/Output Summary (Last 24 hours) at 08/14/2018 1108 Last data filed at 08/14/2018 0802 Gross per 24 hour  Intake 360.49 ml  Output 1000  ml  Net -639.51 ml   Filed Weights   08/12/18 0500 08/13/18 0500 08/14/18 0519  Weight: 63.8 kg 62.5 kg 59.7 kg    Exam: In bed, NAD Bloody urine in canister- seems lighter than prior rrr No increased work of breathing Large discoloration on right flank No LE edema  Data Reviewed:   I have personally reviewed following labs and imaging studies:  Labs: Labs show the following:   Basic Metabolic  Panel: Recent Labs  Lab 08/10/18 2006 08/11/18 0356 08/12/18 0306 08/13/18 0247  NA 139 136 137 138  K 4.3 4.1 4.0 4.3  CL 103 105 105 106  CO2 25 24 26 25   GLUCOSE 116* 111* 85 108*  BUN 21 15 15 15   CREATININE 0.92 0.85 0.84 0.89  CALCIUM 9.9 8.8* 8.4* 8.7*   GFR Estimated Creatinine Clearance: 38.5 mL/min (by C-G formula based on SCr of 0.89 mg/dL). Liver Function Tests: Recent Labs  Lab 08/10/18 2006  AST 32  ALT 21  ALKPHOS 62  BILITOT 0.7  PROT 7.5  ALBUMIN 4.3   Recent Labs  Lab 08/10/18 2006  LIPASE 33   No results for input(s): AMMONIA in the last 168 hours. Coagulation profile Recent Labs  Lab 08/10/18 2006 08/11/18 0356 08/12/18 0306 08/13/18 0247 08/14/18 0329  INR 3.0* 3.0* 4.3* 1.9* 1.2    CBC: Recent Labs  Lab 08/10/18 2006 08/11/18 0047 08/11/18 0356 08/12/18 0306 08/13/18 0247 08/14/18 0329  WBC 5.1 9.5 8.1 5.7 7.0 6.4  NEUTROABS 3.4  --   --   --   --   --   HGB 12.6 11.8* 11.3* 9.6* 10.5* 10.5*  HCT 39.3 36.9 34.8* 29.7* 32.4* 31.5*  MCV 96.6 96.1 94.1 94.3 94.2 93.2  PLT 119* 114* 131* 104* 114* 119*   Cardiac Enzymes: No results for input(s): CKTOTAL, CKMB, CKMBINDEX, TROPONINI in the last 168 hours. BNP (last 3 results) No results for input(s): PROBNP in the last 8760 hours. CBG: Recent Labs  Lab 08/11/18 0748 08/12/18 0745 08/13/18 0835 08/14/18 0802  GLUCAP 101* 104* 99 84   D-Dimer: No results for input(s): DDIMER in the last 72 hours. Hgb A1c: No results for input(s): HGBA1C in the last 72 hours. Lipid Profile: No results for input(s): CHOL, HDL, LDLCALC, TRIG, CHOLHDL, LDLDIRECT in the last 72 hours. Thyroid function studies: No results for input(s): TSH, T4TOTAL, T3FREE, THYROIDAB in the last 72 hours.  Invalid input(s): FREET3 Anemia work up: No results for input(s): VITAMINB12, FOLATE, FERRITIN, TIBC, IRON, RETICCTPCT in the last 72 hours. Sepsis Labs: Recent Labs  Lab 08/11/18 0356 08/12/18 0306  08/13/18 0247 08/14/18 0329  WBC 8.1 5.7 7.0 6.4    Microbiology Recent Results (from the past 240 hour(s))  SARS Coronavirus 2 (CEPHEID - Performed in [place] hospital lab), Hosp Order     Status: None   Collection Time: 08/10/18 10:33 PM   Specimen: Nasopharyngeal Swab  Result Value Ref Range Status   SARS Coronavirus 2 NEGATIVE NEGATIVE Final    Comment: (NOTE) If result is NEGATIVE SARS-CoV-2 target nucleic acids are NOT DETECTED. The SARS-CoV-2 RNA is generally detectable in upper and lower  respiratory specimens during the acute phase of infection. The lowest  concentration of SARS-CoV-2 viral copies this assay can detect is 250  copies / mL. A negative result does not preclude SARS-CoV-2 infection  and should not be used as the sole basis for treatment or other  patient management decisions.  A negative result may  occur with  improper specimen collection / handling, submission of specimen other  than nasopharyngeal swab, presence of viral mutation(s) within the  areas targeted by this assay, and inadequate number of viral copies  (<250 copies / mL). A negative result must be combined with clinical  observations, patient history, and epidemiological information. If result is POSITIVE SARS-CoV-2 target nucleic acids are DETECTED. The SARS-CoV-2 RNA is generally detectable in upper and lower  respiratory specimens dur ing the acute phase of infection.  Positive  results are indicative of active infection with SARS-CoV-2.  Clinical  correlation with patient history and other diagnostic information is  necessary to determine patient infection status.  Positive results do  not rule out bacterial infection or co-infection with other viruses. If result is PRESUMPTIVE POSTIVE SARS-CoV-2 nucleic acids MAY BE PRESENT.   A presumptive positive result was obtained on the submitted specimen  and confirmed on repeat testing.  While 2019 novel coronavirus  (SARS-CoV-2) nucleic  acids may be present in the submitted sample  additional confirmatory testing may be necessary for epidemiological  and / or clinical management purposes  to differentiate between  SARS-CoV-2 and other Sarbecovirus currently known to infect humans.  If clinically indicated additional testing with an alternate test  methodology [phone]) is advised. The SARS-CoV-2 RNA is generally  detectable in upper and lower respiratory sp ecimens during the acute  phase of infection. The expected result is Negative. Fact Sheet for Patients:  StrictlyIdeas.no Fact Sheet for Healthcare Providers: BankingDealers.co.za This test is not yet approved or cleared by the [place] FDA and has been authorized for detection and/or diagnosis of SARS-CoV-2 by FDA under an Emergency Use Authorization (EUA).  This EUA will remain in effect (meaning this test can be used) for the duration of the COVID-19 declaration under Section 564(b)(1) of the Act, 21 U.S.C. section 360bbb-3(b)(1), unless the authorization is terminated or revoked sooner. Performed at [facility], [place] [address], [place], [place] [address]     Procedures and diagnostic studies:  No results found.  Medications:   . acetaminophen  1,000 mg Oral TID  . atorvastatin  10 mg Oral q1800  . bisacodyl  10 mg Rectal Once  . brimonidine  1 drop Both Eyes BID  . docusate sodium  100 mg Oral BID  . latanoprost  1 drop Both Eyes QHS  . levothyroxine  25 mcg Oral QAC breakfast  . losartan  50 mg Oral Daily  . metoprolol tartrate  25 mg Oral BID  . polyethylene glycol  17 g Oral Daily  . warfarin  5 mg Oral ONCE-1800  . Warfarin - Pharmacist Dosing Inpatient   Does not apply q1800   Continuous Infusions: . cefTRIAXone (ROCEPHIN)  IV 1 g (08/13/18 1813)  . heparin       LOS: 3 days   Geradine Girt  [facility]   How to contact the [facility] Attending or Consulting  provider [place] or covering provider during after hours [place], for this patient?  1. Check the care team in [facility] and look for a) attending/consulting [facility] provider listed and b) the [facility] team listed 2. Log into [email or web address] and use 's universal password to access. If you do not have the password, please contact the hospital operator. 3. Locate the [facility] provider you are looking for under [facility] and page to a number that you can be directly reached. 4. If you still have difficulty reaching the provider, please  page the [facility] (Director on Call) for the Hospitalists listed on amion for assistance.  08/14/2018, 11:08 AM

## 2018-08-14 NOTE — Progress Notes (Signed)
Physical Therapy Treatment Patient Details Name: Robin Harrell MRN: [id number] DOB: 04-12-1933 Today's Date: 08/14/2018    History of Present Illness 85 y/o female presenting to ED after fall and R flank pain. Imaging reveals: right rib fractures 10 through 12 and associated subcutaneous contusion, as well as nondisplaced right L1 transverse process fracture.  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     PT Comments    Pt is up to walk with PT x 2 and note her continued quality of gait with wider base, shorter steps and poor postural control with flexed spine.  Pt is requiring help to control descent but is definitely a single person assist for all movement.  Follow up acutely with her for strength, balance and endurance of gait as her pain and frequent incontinence allow.  Pt is still expecting to transition home and have a daughter care for her.  Follow Up Recommendations  Home health PT;Supervision for mobility/OOB     Equipment Recommendations  Rolling walker with 5" wheels    Recommendations for Other Services       Precautions / Restrictions Precautions Precautions: Fall Precaution Comments: hematuria Required Braces or Orthoses: (purwick) Restrictions Weight Bearing Restrictions: No    Mobility  Bed Mobility               General bed mobility comments: up in chair when PT saw her x 2 today  Transfers Overall transfer level: Needs assistance Equipment used: Rolling walker (2 wheeled);1 person hand held assist Transfers: Sit to/from Stand Sit to Stand: Min guard;Min assist         General transfer comment: min assist to power up and min guard to initially steady  Ambulation/Gait Ambulation/Gait assistance: Min guard Gait Distance (Feet): 45 [id number]) Assistive device: Rolling walker (2 wheeled);1 person hand held assist Gait Pattern/deviations: Step-through pattern;Decreased  stride length;Wide base of support;Trunk flexed Gait velocity: decreased Gait velocity interpretation: <1.31 ft/sec, indicative of household ambulator General Gait Details: controlling her balance with RW, has no LOB to go to [employer]             Wheelchair Mobility    Modified Rankin (Stroke Patients Only)       Balance   Sitting-balance support: Feet supported Sitting balance-Leahy Scale: Good     Standing balance support: Bilateral upper extremity supported;During functional activity Standing balance-Leahy Scale: Fair Standing balance comment: RW is keeping her balance in standing to steady during clothing management to use commode                            Cognition Arousal/Alertness: Awake/alert Behavior During Therapy: WFL for tasks assessed/performed Overall Cognitive Status: Within Functional Limits for tasks assessed                                        Exercises General Exercises - Lower Extremity Ankle Circles/Pumps: AROM;AAROM;Both;5 reps Quad Sets: AROM;Both;10 reps Heel Slides: AROM;Both;10 reps Hip ABduction/ADduction: AROM;Both;10 reps Straight Leg Raises: AROM;Both;10 reps    General Comments General comments (skin integrity, edema, etc.): pt is getting up to walk on RW with PT for BR, transfers, to maneuver in room but have to cue turns on walker every trial to sit      Pertinent Vitals/Pain Pain  Assessment: Faces Faces Pain Scale: Hurts little more Pain Location: R ribs Pain Descriptors / Indicators: Aching Pain Intervention(s): Limited activity within patient's tolerance;Monitored during session;Premedicated before session;Repositioned    Home Living                      Prior Function            PT Goals (current goals can now be found in the care plan section) Acute Rehab PT Goals Patient Stated Goal: to get back home  Progress towards PT goals: Progressing toward goals     Frequency    Min 3X/week      PT Plan Current plan remains appropriate    Co-evaluation              AM-PAC PT "6 Clicks" Mobility   Outcome Measure  Help needed turning from your back to your side while in a flat bed without using bedrails?: A Little Help needed moving from lying on your back to sitting on the side of a flat bed without using bedrails?: A Little Help needed moving to and from a bed to a chair (including a wheelchair)?: A Little Help needed standing up from a chair using your arms (e.g., wheelchair or bedside chair)?: A Little Help needed to walk in hospital room?: A Little Help needed climbing 3-5 steps with a railing? : A Lot 6 Click Score: 17    End of Session Equipment Utilized During Treatment: Gait belt Activity Tolerance: Patient limited by fatigue Patient left: in chair;with call bell/phone within reach;with chair alarm set;with nursing/sitter in room Nurse Communication: Mobility status PT Visit Diagnosis: Other abnormalities of gait and mobility (R26.89);Unsteadiness on feet (R26.81);Pain;History of falling (Z91.81) Pain - Right/Left: Right Pain - part of body: (ribs)     Time: 1446-1513(+1345 to 1357) PT Time Calculation (min) (ACUTE ONLY): 27 min  Charges:  $Gait Training: 23-37 mins $Therapeutic Exercise: 8-22 mins                   Ramond Dial 08/14/2018, 4:31 PM  Mee Hives, PT MS Acute Rehab Dept. Number: [place] [facility] [place]

## 2018-08-14 NOTE — Progress Notes (Signed)
Patient updated her daughters of her status

## 2018-08-15 DIAGNOSIS — I350 Nonrheumatic aortic (valve) stenosis: Secondary | ICD-10-CM

## 2018-08-15 LAB — BASIC METABOLIC PANEL
Anion gap: 8 (ref 5–15)
BUN: 17 mg/dL (ref 8–23)
CO2: 25 mmol/L (ref 22–32)
Calcium: 8.3 mg/dL — ABNORMAL LOW (ref 8.9–10.3)
Chloride: 103 mmol/L (ref 98–111)
Creatinine, Ser: 0.99 mg/dL (ref 0.44–1.00)
GFR calc Af Amer: >60 mL/min (ref >60)
GFR calc non Af Amer: 53 mL/min — ABNORMAL LOW (ref >60)
Glucose, Bld: 107 mg/dL — ABNORMAL HIGH (ref 70–99)
Potassium: 4.3 mmol/L (ref 3.5–5.1)
Sodium: 136 mmol/L (ref 135–145)

## 2018-08-15 LAB — CBC
HCT: 32.1 % — ABNORMAL LOW (ref 36.0–46.0)
Hemoglobin: 10.3 g/dL — ABNORMAL LOW (ref 12.0–15.0)
MCH: 30.4 pg (ref 26.0–34.0)
MCHC: 32.1 g/dL (ref 30.0–36.0)
MCV: 94.7 fL (ref 80.0–100.0)
Platelets: 134 10*3/uL — ABNORMAL LOW (ref 150–400)
RBC: 3.39 MIL/uL — ABNORMAL LOW (ref 3.87–5.11)
RDW: 13.3 % (ref 11.5–15.5)
WBC: 6.3 10*3/uL (ref 4.0–10.5)
nRBC: 0 % (ref 0.0–0.2)

## 2018-08-15 LAB — PROTIME-INR
INR: 1.2 (ref 0.8–1.2)
Prothrombin Time: 15.2 seconds (ref 11.4–15.2)

## 2018-08-15 LAB — GLUCOSE, CAPILLARY: Glucose-Capillary: 99 mg/dL (ref 70–99)

## 2018-08-15 MED ORDER — WARFARIN SODIUM 5 MG PO TABS
5.0000 mg | ORAL_TABLET | Freq: Once | ORAL | Status: AC
  Administered 2018-08-15: 17:00:00 5 mg via ORAL
  Filled 2018-08-15: qty 1

## 2018-08-15 NOTE — Progress Notes (Signed)
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  On initial imaging she was found to have significant rib fractures on the right side as well as concussion on the right flank presumably involving her right kidney given onset of hematuria after the fall  Subjective: Continues to complain of pain on the right side, no chest pain, no shortness of breath, no nausea or vomiting  Assessment & Plan: Principal Problem:   Multiple rib fractures, right 10-12 Active Problems:   Essential hypertension   History of DVT (deep vein thrombosis)   GERD   Long term (current) use of anticoagulants   Hyperlipidemia   Chronic idiopathic thrombocytopenia (HCC)   Diastolic dysfunction   Factor V deficiency (HCC)   History of respiratory failure   Hypothyroidism (acquired)   Insomnia   Osteoarthritis, multiple sites   Osteoporosis   History of recurrent UTIs   Aortic stenosis, mild   Gross hematuria   Traumatic hematoma of flank   Fall against sharp object   Hematuria   Principal Problem Fall with multiple rib fractures, L1 transverse process fracture -This is after mechanical fall without hitting head or losing consciousness -CT scan showed fractures of the ribs 10-12 on the right as well as L1 transverse process fracture -Continue pain control, incentive spirometry -PT recommends home health -Trauma surgery consulted, signed off yesterday  Active Problems Gross hematuria due to kidney concussion -This developed after the fall -CT scan does not show any injury to the kidneys, ureter, bladder.  Discussed with Dr. Junious Silk with urology, she had her Coumadin held for 48 hours and  received vitamin K, currently INR is 1.2 and will resume Coumadin -Urine appears pinkish however hemoglobin is stable, per urology she can have pink-tinged urine for couple of weeks  Factor V deficiency, history of DVT/PE -Difficult situation, INR is 1.2, have resumed Coumadin without heparin bridging, will closely monitor hemoglobin as well as hematuria  Thrombocytopenia -Chronically low, trend  Hypertension -Continue metoprolol and losartan  Hypothyroidism -Continue Synthroid  Chronic diastolic CHF -Appears compensated  Anemia -Concern for ABLA, daily CBC  UTI -cultures pending, she was symptomatic   Scheduled Meds: . acetaminophen  1,000 mg Oral TID  . atorvastatin  10 mg Oral q1800  . brimonidine  1 drop Both Eyes BID  . docusate sodium  100 mg Oral BID  . latanoprost  1 drop Both Eyes QHS  . levothyroxine  25 mcg Oral QAC breakfast  . losartan  50 mg Oral Daily  . metoprolol tartrate  25 mg Oral BID  . polyethylene glycol  17 g Oral Daily  . warfarin  5 mg Oral ONCE-1800  . Warfarin - Pharmacist Dosing Inpatient   Does not apply q1800   Continuous Infusions: . cefTRIAXone (ROCEPHIN)  IV 1 g (08/14/18 1610)   PRN Meds:.bisacodyl, ondansetron **OR** ondansetron (ZOFRAN) IV, traMADol  DVT prophylaxis: Coumadin Code Status: Full code Family Communication: called daughter  Disposition Plan: home when ready   Consultants:   Trauma surgery   Procedures:   None   Antimicrobials:  Ceftiraxone   Objective: Vitals:   08/14/18 2029 08/15/18 0521 08/15/18 0600 08/15/18 1338  BP: (!) 123/57 139/62  122/67  Pulse: 98 83  72  Resp: 18   20  Temp: 98.9 F (37.2 C) 98.4 F (36.9 C)  (!) 97.4 F (36.3 C)  TempSrc: Oral Oral  Oral  SpO2: 98% 98%  95%  Weight:   60.2 kg     Intake/Output Summary (Last 24 hours) at 08/15/2018 1408 Last data filed at 08/15/2018 0900 Gross per 24 hour  Intake 340 ml  Output 600 ml  Net -260 ml   Filed Weights   08/13/18  0500 08/14/18 0519 08/15/18 0600  Weight: 62.5 kg 59.7 kg 60.2 kg    Examination:  Constitutional: NAD Eyes: PERRL, lids and conjunctivae normal ENMT: Mucous membranes are moist.  Respiratory: clear to auscultation bilaterally, no wheezing, no crackles. Normal respiratory effort.  Cardiovascular: Regular rate and rhythm, no murmurs / rubs / gallops. No LE edema.  Abdomen: no tenderness. Bowel sounds positive.  Musculoskeletal: no clubbing / cyanosis. Skin: no rashes Neurologic: CN 2-12 grossly intact. Strength 5/5 in all 4.  Psychiatric: Normal judgment and insight. Alert and oriented x 3. Normal mood.    Data Reviewed: I have independently reviewed following labs and imaging studies  CBC: Recent Labs  Lab 08/10/18 2006  08/11/18 0356 08/12/18 0306 08/13/18 0247 08/14/18 0329 08/15/18 0241  WBC 5.1   < > 8.1 5.7 7.0 6.4 6.3  NEUTROABS 3.4  --   --   --   --   --   --   HGB 12.6   < > 11.3* 9.6* 10.5* 10.5* 10.3*  HCT 39.3   < > 34.8* 29.7* 32.4* 31.5* 32.1*  MCV 96.6   < > 94.1 94.3 94.2 93.2 94.7  PLT 119*   < > 131* 104* 114* 119* 134*   < > = values in this interval not displayed.   Basic Metabolic Panel: Recent Labs  Lab 08/10/18 2006 08/11/18 0356 08/12/18 0306 08/13/18 0247 08/15/18 0241  NA 139 136 137 138 136  K 4.3 4.1 4.0 4.3 4.3  CL 103 105 105 106 103  CO2 25 24 26 25 25   GLUCOSE 116* 111* 85 108* 107*  BUN 21 15 15 15 17   CREATININE 0.92 0.85 0.84 0.89 0.99  CALCIUM 9.9 8.8* 8.4* 8.7* 8.3*   GFR: Estimated Creatinine Clearance: 37.4 mL/min (by C-G formula based on SCr of 0.99 mg/dL). Liver Function Tests: Recent Labs  Lab 08/10/18 2006  AST 32  ALT 21  ALKPHOS 62  BILITOT 0.7  PROT 7.5  ALBUMIN 4.3   Recent Labs  Lab 08/10/18 2006  LIPASE 33   No results for input(s): AMMONIA in the last 168 hours. Coagulation Profile: Recent Labs  Lab 08/11/18 0356 08/12/18 0306 08/13/18 0247 08/14/18 0329 08/15/18 0241  INR 3.0* 4.3* 1.9*  1.2 1.2   Cardiac Enzymes: No results for input(s): CKTOTAL, CKMB, CKMBINDEX, TROPONINI in the last 168 hours. BNP (last 3 results) No results for input(s): PROBNP in the last 8760 hours. HbA1C: No results for input(s): HGBA1C in the last 72 hours. CBG: Recent Labs  Lab 08/11/18 0748 08/12/18 0745 08/13/18 0835 08/14/18 0802 08/15/18 0802  GLUCAP 101* 104* 99 84 99   Lipid Profile: No results for input(s): CHOL, HDL, LDLCALC, TRIG, CHOLHDL, LDLDIRECT in the last 72 hours. Thyroid Function Tests: No results for input(s): TSH, T4TOTAL, FREET4, T3FREE, THYROIDAB in the last 72 hours. Anemia Panel: No results for input(s): VITAMINB12, FOLATE, FERRITIN, TIBC, IRON, RETICCTPCT in the last 72 hours. Urine analysis:    Component Value  Date/Time   COLORURINE AMBER (A) 08/13/2018 1559   APPEARANCEUR CLOUDY (A) 08/13/2018 1559   LABSPEC 1.014 08/13/2018 1559   PHURINE 7.0 08/13/2018 1559   GLUCOSEU NEGATIVE 08/13/2018 1559   HGBUR LARGE (A) 08/13/2018 1559   BILIRUBINUR NEGATIVE 08/13/2018 1559   KETONESUR NEGATIVE 08/13/2018 1559   PROTEINUR 100 (A) 08/13/2018 1559   NITRITE NEGATIVE 08/13/2018 1559   LEUKOCYTESUR MODERATE (A) 08/13/2018 1559   Sepsis Labs: Invalid input(s): PROCALCITONIN, LACTICIDVEN  Recent Results (from the past 240 hour(s))  SARS Coronavirus 2 (CEPHEID - Performed in [place] hospital lab), Hosp Order     Status: None   Collection Time: 08/10/18 10:33 PM   Specimen: Nasopharyngeal Swab  Result Value Ref Range Status   SARS Coronavirus 2 NEGATIVE NEGATIVE Final    Comment: (NOTE) If result is NEGATIVE SARS-CoV-2 target nucleic acids are NOT DETECTED. The SARS-CoV-2 RNA is generally detectable in upper and lower  respiratory specimens during the acute phase of infection. The lowest  concentration of SARS-CoV-2 viral copies this assay can detect is 250  copies / mL. A negative result does not preclude SARS-CoV-2 infection  and should not be used as  the sole basis for treatment or other  patient management decisions.  A negative result may occur with  improper specimen collection / handling, submission of specimen other  than nasopharyngeal swab, presence of viral mutation(s) within the  areas targeted by this assay, and inadequate number of viral copies  (<250 copies / mL). A negative result must be combined with clinical  observations, patient history, and epidemiological information. If result is POSITIVE SARS-CoV-2 target nucleic acids are DETECTED. The SARS-CoV-2 RNA is generally detectable in upper and lower  respiratory specimens dur ing the acute phase of infection.  Positive  results are indicative of active infection with SARS-CoV-2.  Clinical  correlation with patient history and other diagnostic information is  necessary to determine patient infection status.  Positive results do  not rule out bacterial infection or co-infection with other viruses. If result is PRESUMPTIVE POSTIVE SARS-CoV-2 nucleic acids MAY BE PRESENT.   A presumptive positive result was obtained on the submitted specimen  and confirmed on repeat testing.  While 2019 novel coronavirus  (SARS-CoV-2) nucleic acids may be present in the submitted sample  additional confirmatory testing may be necessary for epidemiological  and / or clinical management purposes  to differentiate between  SARS-CoV-2 and other Sarbecovirus currently known to infect humans.  If clinically indicated additional testing with an alternate test  methodology [phone]) is advised. The SARS-CoV-2 RNA is generally  detectable in upper and lower respiratory sp ecimens during the acute  phase of infection. The expected result is Negative. Fact Sheet for Patients:  StrictlyIdeas.no Fact Sheet for Healthcare Providers: BankingDealers.co.za This test is not yet approved or cleared by the [place] FDA and has been authorized for  detection and/or diagnosis of SARS-CoV-2 by FDA under an Emergency Use Authorization (EUA).  This EUA will remain in effect (meaning this test can be used) for the duration of the COVID-19 declaration under Section 564(b)(1) of the Act, 21 U.S.C. section 360bbb-3(b)(1), unless the authorization is terminated or revoked sooner. Performed at [facility], [place] [address], [place], [place] [address]       Radiology Studies: No results found.  Marzetta Board, MD, PhD [facility]  Contact via  [email or web address]  [place] P: [phone]  F: [phone]

## 2018-08-15 NOTE — TOC Progression Note (Addendum)
Transition of Care [facility]) - Progression Note    Patient Details  Name: Robin Harrell MRN: [id number] Date of Birth: January 25, 1934  Transition of [facility]) CM/SW Contact  Corwin Kuiken, Edson Snowball, RN Phone Number: 08/15/2018, 10:39 AM  Clinical Narrative:   Patient's daughter [name] returned call and confirmed family plans on taking patient home at discharge with [place] for [place]. Patient has walker and 3 in 1 . Family will provide 24 hour supervision for both parents in the home.   See note from Monday. PT recommendation HHPT one person assist.   Called daughter [name] at number given Monday left voicemail with my direct call back number.  Patient offered choice, she would like [place]. Will need orders and face to face.  Referral given to [facility] with [place].  Patient already has walker and 3 in 1 at home.   Expected Discharge Plan: [place] Barriers to Discharge: Continued Medical Work up  Expected Discharge Plan and Services Expected Discharge Plan: [place] In-house Referral: Clinical Social Work Discharge Planning Services: CM Consult Post Acute Care Choice: [place] arrangements for the past 2 months: Single Family Home Expected Discharge Date: 08/15/18               DME Arranged: N/A         HH Arranged: PT, OT HH Agency: [place] ([facility]) Date [place]: 08/15/18 Time [place]: 1038 Representative spoke with at [place]: Linna Hoff   Social Determinants of Health ([place]) Interventions    Readmission Risk Interventions No flowsheet data found.

## 2018-08-15 NOTE — Progress Notes (Signed)
[place] for Warfarin & Heparin bridge Indication: DVT, hx Factor V Leiden deficiency  Allergies  Allergen Reactions  . Codeine Nausea Only  . Erythromycin Nausea And Vomiting   Patient Measurements: Weight: 132 lb 11.5 oz (60.2 kg)  Vital Signs: Temp: 98.4 F (36.9 C) (06/24 0521) Temp Source: Oral (06/24 0521) BP: 139/62 (06/24 0521) Pulse Rate: 83 (06/24 0521)  Labs: Recent Labs    08/13/18 0247 08/14/18 0329 08/15/18 0241  HGB 10.5* 10.5* 10.3*  HCT 32.4* 31.5* 32.1*  PLT 114* 119* 134*  LABPROT 21.8* 15.1 15.2  INR 1.9* 1.2 1.2  CREATININE 0.89  --  0.99   Estimated Creatinine Clearance: 37.4 mL/min (by C-G formula based on SCr of 0.99 mg/dL).  Medical History: Past Medical History:  Diagnosis Date  . Allergic rhinitis   . Cough   . Difficulty in swallowing    w/ occasional aspiration  . DVT (deep venous thrombosis) ([place])   . Dyslipidemia   . Factor V Leiden deficiency    lifelong coumadin  . GERD (gastroesophageal reflux disease)   . History of nuclear stress test 12/28/2007   lexiscan; low risk   . HTN (hypertension)   . PND (post-nasal drip)   . Pulmonary embolism ([place])   . PVC's (premature ventricular contractions)   . Thyroid disease    Medications:  Scheduled:  . acetaminophen  1,000 mg Oral TID  . atorvastatin  10 mg Oral q1800  . brimonidine  1 drop Both Eyes BID  . docusate sodium  100 mg Oral BID  . latanoprost  1 drop Both Eyes QHS  . levothyroxine  25 mcg Oral QAC breakfast  . losartan  50 mg Oral Daily  . metoprolol tartrate  25 mg Oral BID  . polyethylene glycol  17 g Oral Daily  . Warfarin - Pharmacist Dosing Inpatient   Does not apply q1800   Assessment:  66 yoF admit 6/19 after fall at church, R flank pain, rib fx > hematuria - but no renal injury per CT  PMH: hx DVT/PE, Factor V Leiden on Warf 3.75mg  MWF, 7.5mg  other days, LD 6/18, dCHF, HTN, hypoThyroid, HPL, chronic  thrombocytopenia  Goal of Therapy:  INR 2-3 Monitor platelets by anticoagulation protocol: Yes   Today, 08/15/2018 -INR this am remains 1.2 after 2.5, 5mg  Warfarin -Po intake continues decreased  -Hgb 10.3, Plt 134 (chronically low)  -6/21 INR = 4.3, VitK 2.5mg  po given 6/21 >> 1.9 (6/22) -Added IV Heparin for bridging to therapeutic INR 6/23, but then discontinued for hematuria, Warfarin was given 6/23   Plan:  Warfarin 5mg  today at 1800 Daily INR  Monitor CBC, s/s bleed  Minda Ditto PharmD [phone] 08/15/2018,8:12 AM

## 2018-08-16 LAB — CBC
HCT: 31.2 % — ABNORMAL LOW (ref 36.0–46.0)
Hemoglobin: 10.3 g/dL — ABNORMAL LOW (ref 12.0–15.0)
MCH: 31 pg (ref 26.0–34.0)
MCHC: 33 g/dL (ref 30.0–36.0)
MCV: 94 fL (ref 80.0–100.0)
Platelets: 149 10*3/uL — ABNORMAL LOW (ref 150–400)
RBC: 3.32 MIL/uL — ABNORMAL LOW (ref 3.87–5.11)
RDW: 13.2 % (ref 11.5–15.5)
WBC: 5.5 10*3/uL (ref 4.0–10.5)
nRBC: 0 % (ref 0.0–0.2)

## 2018-08-16 LAB — BASIC METABOLIC PANEL
Anion gap: 8 (ref 5–15)
BUN: 13 mg/dL (ref 8–23)
CO2: 25 mmol/L (ref 22–32)
Calcium: 8.5 mg/dL — ABNORMAL LOW (ref 8.9–10.3)
Chloride: 103 mmol/L (ref 98–111)
Creatinine, Ser: 0.77 mg/dL (ref 0.44–1.00)
GFR calc Af Amer: >60 mL/min (ref >60)
GFR calc non Af Amer: >60 mL/min (ref >60)
Glucose, Bld: 96 mg/dL (ref 70–99)
Potassium: 4.3 mmol/L (ref 3.5–5.1)
Sodium: 136 mmol/L (ref 135–145)

## 2018-08-16 LAB — GLUCOSE, CAPILLARY: Glucose-Capillary: 95 mg/dL (ref 70–99)

## 2018-08-16 LAB — PROTIME-INR
INR: 1.4 — ABNORMAL HIGH (ref 0.8–1.2)
Prothrombin Time: 16.8 seconds — ABNORMAL HIGH (ref 11.4–15.2)

## 2018-08-16 MED ORDER — TRAMADOL HCL 50 MG PO TABS: 50.0000 mg | ORAL_TABLET | Freq: Four times a day (QID) | ORAL | 0 refills | Status: DC | PRN

## 2018-08-16 MED ORDER — WARFARIN SODIUM 6 MG PO TABS
6.0000 mg | ORAL_TABLET | Freq: Once | ORAL | Status: AC
  Administered 2018-08-16: 6 mg via ORAL
  Filled 2018-08-16: qty 1

## 2018-08-16 MED ORDER — MILK AND MOLASSES ENEMA
1.0000 | Freq: Once | RECTAL | Status: AC
  Administered 2018-08-16: 240 mL via RECTAL
  Filled 2018-08-16: qty 240

## 2018-08-16 MED ORDER — SENNOSIDES-DOCUSATE SODIUM 8.6-50 MG PO TABS
2.0000 | ORAL_TABLET | Freq: Two times a day (BID) | ORAL | Status: DC
  Administered 2018-08-16 – 2018-08-18 (×3): 2 via ORAL
  Filled 2018-08-16 (×3): qty 2

## 2018-08-16 MED ORDER — SODIUM CHLORIDE 0.9% FLUSH
10.0000 mL | Freq: Two times a day (BID) | INTRAVENOUS | Status: DC
  Administered 2018-08-18: 10 mL

## 2018-08-16 MED ORDER — SODIUM CHLORIDE 0.9% FLUSH: 10.0000 mL | INTRAVENOUS | Status: DC | PRN

## 2018-08-16 MED ORDER — CEFPODOXIME PROXETIL 200 MG PO TABS: 200.0000 mg | ORAL_TABLET | Freq: Two times a day (BID) | ORAL | 0 refills | Status: DC

## 2018-08-16 MED ORDER — GLYCERIN (LAXATIVE) 2.1 G RE SUPP
1.0000 | Freq: Once | RECTAL | Status: AC
  Administered 2018-08-16: 1 via RECTAL
  Filled 2018-08-16: qty 1

## 2018-08-16 NOTE — Progress Notes (Signed)
[place] for Warfarin & Heparin bridge Indication: DVT, hx Factor V Leiden deficiency  Allergies  Allergen Reactions  . Codeine Nausea Only  . Erythromycin Nausea And Vomiting   Patient Measurements: Weight: 133 lb 2.5 oz (60.4 kg)  Vital Signs: Temp: 98.2 F (36.8 C) (06/25 0533) Temp Source: Oral (06/25 0533) BP: 123/55 (06/25 0533) Pulse Rate: 72 (06/25 0533)  Labs: Recent Labs    08/14/18 0329 08/15/18 0241 08/16/18 0250  HGB 10.5* 10.3* 10.3*  HCT 31.5* 32.1* 31.2*  PLT 119* 134* 149*  LABPROT 15.1 15.2 16.8*  INR 1.2 1.2 1.4*  CREATININE  --  0.99 0.77   Estimated Creatinine Clearance: 46.4 mL/min (by C-G formula based on SCr of 0.77 mg/dL).  Medical History: Past Medical History:  Diagnosis Date  . Allergic rhinitis   . Cough   . Difficulty in swallowing    w/ occasional aspiration  . DVT (deep venous thrombosis) ([place])   . Dyslipidemia   . Factor V Leiden deficiency    lifelong coumadin  . GERD (gastroesophageal reflux disease)   . History of nuclear stress test 12/28/2007   lexiscan; low risk   . HTN (hypertension)   . PND (post-nasal drip)   . Pulmonary embolism ([place])   . PVC's (premature ventricular contractions)   . Thyroid disease    Medications:  Scheduled:  . acetaminophen  1,000 mg Oral TID  . atorvastatin  10 mg Oral q1800  . brimonidine  1 drop Both Eyes BID  . docusate sodium  100 mg Oral BID  . latanoprost  1 drop Both Eyes QHS  . levothyroxine  25 mcg Oral QAC breakfast  . losartan  50 mg Oral Daily  . metoprolol tartrate  25 mg Oral BID  . polyethylene glycol  17 g Oral Daily  . Warfarin - Pharmacist Dosing Inpatient   Does not apply q1800   Assessment:  49 yoF admit 6/19 after fall at church, R flank pain, rib fx > hematuria - but no renal injury per CT  PMH: hx DVT/PE, Factor V Leiden on Warf 3.75mg  MWF, 7.5mg  other days, LD 6/18, dCHF, HTN, hypoThyroid, HPL, chronic  thrombocytopenia  Goal of Therapy:  INR 2-3 Monitor platelets by anticoagulation protocol: Yes   Today, 08/16/2018 -INR this am 1.4 after 2.5, 5, 5 mg Warfarin -Po intake somewhat improved  -Hgb 10.3, (unchanged) Plt 149 (chronically low) - trending up  -6/21 INR = 4.3, VitK 2.5mg  po given 6/21 >> 1.9 (6/22) -Added IV Heparin for bridging to therapeutic INR 6/23, but then discontinued for hematuria, Warfarin was given 6/23, ok to continue 6/24 per MD   Plan:  Warfarin 6mg  today at 1800 Daily INR  Monitor CBC, s/s bleed  Minda Ditto PharmD [phone] 08/16/2018,7:35 AM

## 2018-08-16 NOTE — TOC Transition Note (Signed)
Transition of Care [facility]) - CM/SW Discharge Note   Patient Details  Name: Robin Harrell MRN: [id number] Date of Birth: 1933/06/04  Transition of [facility]) CM/SW Contact:  Marilu Favre, RN Phone Number: 08/16/2018, 10:30 AM   Clinical Narrative:     See previous note. Dan with [facility] accepted referral  Final next level of care: [place] Barriers to Discharge: No Barriers Identified   Patient Goals and CMS Choice Patient states their goals for this hospitalization and ongoing recovery are:: to return to home CMS Medicare.gov Compare Post Acute Care list provided to:: Patient Choice offered to / list presented to : Patient  Discharge Placement                       Discharge Plan and Services In-house Referral: Clinical Social Work Discharge Planning Services: CM Consult Post Acute Care Choice: Home Health          DME Arranged: N/A         HH Arranged: PT, OT [place] Agency: [place] ([facility]) Date HH Agency Contacted: 08/16/18 Time [place]: 1030 Representative spoke with at [place]: Linna Hoff  Social Determinants of Health ([place]) Interventions     Readmission Risk Interventions No flowsheet data found.

## 2018-08-16 NOTE — Progress Notes (Signed)
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  On initial imaging she was found to have significant rib fractures on the right side as well as concussion on the right flank presumably involving her right kidney given onset of hematuria after the fall  Subjective: Complaints of abdominal discomfort and constipation.  Has not had a bowel movement for several days now and is not feeling too good.  Assessment & Plan: Principal Problem:   Multiple rib fractures, right 10-12 Active Problems:   Essential hypertension   History of DVT (deep vein thrombosis)   GERD   Long term (current) use of anticoagulants   Hyperlipidemia   Chronic idiopathic thrombocytopenia (HCC)   Diastolic dysfunction   Factor V deficiency (HCC)   History of respiratory failure   Hypothyroidism (acquired)   Insomnia   Osteoarthritis, multiple sites   Osteoporosis   History of recurrent UTIs   Aortic stenosis, mild   Gross hematuria   Traumatic hematoma of flank   Fall against sharp object   Hematuria   Principal Problem Fall with multiple rib fractures, L1 transverse process fracture -This is after mechanical fall without hitting head or losing consciousness -CT scan showed fractures of the ribs 10-12 on the right as well as L1 transverse process fracture -Continue pain control, incentive spirometry -PT recommends home health -Trauma surgery consulted, signed off yesterday  Active Problems Gross hematuria due to kidney concussion -This developed after the fall -CT scan does not show any injury to the kidneys, ureter, bladder.  Discussed with Dr. Junious Silk with urology, she had her  Coumadin held for 48 hours and received vitamin K, currently INR is 1.2 and will resume Coumadin -Urine appears pinkish however hemoglobin is stable, per urology she can have pink-tinged urine for couple of weeks -Remains with mild pink urine, about the same as yesterday, continue to monitor  Constipation -Initially patient felt ready to go home however has not had any success with suppositories and is feeling quite uncomfortable and bloated. -We will provide an enema  Factor V deficiency, history of DVT/PE -Difficult situation, INR is 1.4 this morning, have resumed Coumadin without heparin bridging, will closely monitor hemoglobin as well as hematuria  Thrombocytopenia -Chronically low, trend  Hypertension -Continue metoprolol and losartan, blood pressure is stable  Hypothyroidism -Continue Synthroid  Chronic diastolic CHF -Appears compensated  Anemia -Concern for ABLA, daily CBC, hemoglobin stable  UTI -cultures pending, she was symptomatic   Scheduled Meds: . acetaminophen  1,000 mg Oral TID  . atorvastatin  10 mg Oral q1800  . brimonidine  1 drop Both Eyes BID  . docusate sodium  100 mg Oral BID  . latanoprost  1 drop Both Eyes QHS  . levothyroxine  25 mcg Oral QAC breakfast  . losartan  50 mg Oral Daily  . metoprolol tartrate  25 mg Oral BID  . polyethylene glycol  17 g Oral Daily  . senna-docusate  2 tablet Oral BID  . warfarin  6 mg Oral ONCE-1800  . Warfarin - Pharmacist Dosing Inpatient   Does not apply q1800   Continuous Infusions: . cefTRIAXone (ROCEPHIN)  IV 1 g (08/15/18 1718)   PRN Meds:.bisacodyl, ondansetron **OR**  ondansetron (ZOFRAN) IV, traMADol  DVT prophylaxis: Coumadin Code Status: Full code Family Communication: called daughter  Disposition Plan: Home tomorrow once constipation improves  Consultants:   Trauma surgery   Procedures:   None   Antimicrobials:  Ceftiraxone   Objective: Vitals:   08/15/18 2029 08/16/18 0500 08/16/18  0533 08/16/18 1353  BP: 125/78  (!) 123/55 127/72  Pulse: 80  72 74  Resp: 17  17 17   Temp: 98.3 F (36.8 C)  98.2 F (36.8 C) 98.4 F (36.9 C)  TempSrc: Oral  Oral Oral  SpO2: 98%  98% 96%  Weight:  60.4 kg      Intake/Output Summary (Last 24 hours) at 08/16/2018 1530 Last data filed at 08/16/2018 0932 Gross per 24 hour  Intake 780 ml  Output 500 ml  Net 280 ml   Filed Weights   08/14/18 0519 08/15/18 0600 08/16/18 0500  Weight: 59.7 kg 60.2 kg 60.4 kg    Examination:  Constitutional: She is in no distress Eyes: No scleral icterus ENMT: Moist mucous membranes Respiratory: Clear bilaterally without wheezing or crackles, normal respiratory effort Cardiovascular: Regular rate and rhythm, no murmurs.  No edema Abdomen: Mildly distended, somewhat uncomfortable to palpation, no guarding or rebound, positive but diminished bowel sounds Musculoskeletal: no clubbing / cyanosis. Skin: No rashes seen Neurologic: No focal deficits, equal strength Psychiatric: Normal judgment and insight. Alert and oriented x 3. Normal mood.    Data Reviewed: I have independently reviewed following labs and imaging studies  CBC: Recent Labs  Lab 08/10/18 2006  08/12/18 0306 08/13/18 0247 08/14/18 0329 08/15/18 0241 08/16/18 0250  WBC 5.1   < > 5.7 7.0 6.4 6.3 5.5  NEUTROABS 3.4  --   --   --   --   --   --   HGB 12.6   < > 9.6* 10.5* 10.5* 10.3* 10.3*  HCT 39.3   < > 29.7* 32.4* 31.5* 32.1* 31.2*  MCV 96.6   < > 94.3 94.2 93.2 94.7 94.0  PLT 119*   < > 104* 114* 119* 134* 149*   < > = values in this interval not displayed.   Basic Metabolic Panel: Recent Labs  Lab 08/11/18 0356 08/12/18 0306 08/13/18 0247 08/15/18 0241 08/16/18 0250  NA 136 137 138 136 136  K 4.1 4.0 4.3 4.3 4.3  CL 105 105 106 103 103  CO2 24 26 25 25 25   GLUCOSE 111* 85 108* 107* 96  BUN 15 15 15 17 13   CREATININE 0.85 0.84 0.89 0.99 0.77  CALCIUM 8.8* 8.4* 8.7* 8.3* 8.5*   GFR: Estimated Creatinine  Clearance: 46.4 mL/min (by C-G formula based on SCr of 0.77 mg/dL). Liver Function Tests: Recent Labs  Lab 08/10/18 2006  AST 32  ALT 21  ALKPHOS 62  BILITOT 0.7  PROT 7.5  ALBUMIN 4.3   Recent Labs  Lab 08/10/18 2006  LIPASE 33   No results for input(s): AMMONIA in the last 168 hours. Coagulation Profile: Recent Labs  Lab 08/12/18 0306 08/13/18 0247 08/14/18 0329 08/15/18 0241 08/16/18 0250  INR 4.3* 1.9* 1.2 1.2 1.4*   Cardiac Enzymes: No results for input(s): CKTOTAL, CKMB, CKMBINDEX, TROPONINI in the last 168 hours. BNP (last 3 results) No results for input(s): PROBNP in the last 8760 hours. HbA1C: No results for input(s): HGBA1C in the last 72 hours. CBG: Recent Labs  Lab 08/12/18 0745 08/13/18 0835 08/14/18 0802 08/15/18 0802 08/16/18 0813  GLUCAP 104* 99 84 99 95  Lipid Profile: No results for input(s): CHOL, HDL, LDLCALC, TRIG, CHOLHDL, LDLDIRECT in the last 72 hours. Thyroid Function Tests: No results for input(s): TSH, T4TOTAL, FREET4, T3FREE, THYROIDAB in the last 72 hours. Anemia Panel: No results for input(s): VITAMINB12, FOLATE, FERRITIN, TIBC, IRON, RETICCTPCT in the last 72 hours. Urine analysis:    Component Value Date/Time   COLORURINE AMBER (A) 08/13/2018 1559   APPEARANCEUR CLOUDY (A) 08/13/2018 1559   LABSPEC 1.014 08/13/2018 1559   PHURINE 7.0 08/13/2018 1559   GLUCOSEU NEGATIVE 08/13/2018 1559   HGBUR LARGE (A) 08/13/2018 1559   BILIRUBINUR NEGATIVE 08/13/2018 1559   KETONESUR NEGATIVE 08/13/2018 1559   PROTEINUR 100 (A) 08/13/2018 1559   NITRITE NEGATIVE 08/13/2018 1559   LEUKOCYTESUR MODERATE (A) 08/13/2018 1559   Sepsis Labs: Invalid input(s): PROCALCITONIN, LACTICIDVEN  Recent Results (from the past 240 hour(s))  SARS Coronavirus 2 (CEPHEID - Performed in [place] hospital lab), Hosp Order     Status: None   Collection Time: 08/10/18 10:33 PM   Specimen: Nasopharyngeal Swab  Result Value Ref Range Status   SARS  Coronavirus 2 NEGATIVE NEGATIVE Final    Comment: (NOTE) If result is NEGATIVE SARS-CoV-2 target nucleic acids are NOT DETECTED. The SARS-CoV-2 RNA is generally detectable in upper and lower  respiratory specimens during the acute phase of infection. The lowest  concentration of SARS-CoV-2 viral copies this assay can detect is 250  copies / mL. A negative result does not preclude SARS-CoV-2 infection  and should not be used as the sole basis for treatment or other  patient management decisions.  A negative result may occur with  improper specimen collection / handling, submission of specimen other  than nasopharyngeal swab, presence of viral mutation(s) within the  areas targeted by this assay, and inadequate number of viral copies  (<250 copies / mL). A negative result must be combined with clinical  observations, patient history, and epidemiological information. If result is POSITIVE SARS-CoV-2 target nucleic acids are DETECTED. The SARS-CoV-2 RNA is generally detectable in upper and lower  respiratory specimens dur ing the acute phase of infection.  Positive  results are indicative of active infection with SARS-CoV-2.  Clinical  correlation with patient history and other diagnostic information is  necessary to determine patient infection status.  Positive results do  not rule out bacterial infection or co-infection with other viruses. If result is PRESUMPTIVE POSTIVE SARS-CoV-2 nucleic acids MAY BE PRESENT.   A presumptive positive result was obtained on the submitted specimen  and confirmed on repeat testing.  While 2019 novel coronavirus  (SARS-CoV-2) nucleic acids may be present in the submitted sample  additional confirmatory testing may be necessary for epidemiological  and / or clinical management purposes  to differentiate between  SARS-CoV-2 and other Sarbecovirus currently known to infect humans.  If clinically indicated additional testing with an alternate test   methodology [phone]) is advised. The SARS-CoV-2 RNA is generally  detectable in upper and lower respiratory sp ecimens during the acute  phase of infection. The expected result is Negative. Fact Sheet for Patients:  StrictlyIdeas.no Fact Sheet for Healthcare Providers: BankingDealers.co.za This test is not yet approved or cleared by the [place] FDA and has been authorized for detection and/or diagnosis of SARS-CoV-2 by FDA under an Emergency Use Authorization (EUA).  This EUA will remain in effect (meaning this test can be used) for the duration of the COVID-19 declaration under Section 564(b)(1) of the Act, 21 U.S.C. section 360bbb-3(b)(1), unless the authorization is  terminated or revoked sooner. Performed at [facility], [place] [address], [place], [place] [address]       Radiology Studies: No results found.  Marzetta Board, MD, PhD [facility]  Contact via  [email or web address]  [place] P: [phone]  F: [phone]

## 2018-08-16 NOTE — Progress Notes (Signed)
Physical Therapy Treatment Patient Details Name: Robin Harrell MRN: [id number] DOB: [birth year] Today's Date: 08/16/2018    History of Present Illness 85 y/o female presenting to ED after fall and R flank pain. Imaging reveals: right rib fractures 10 through 12 and associated subcutaneous contusion, as well as nondisplaced right L1 transverse process fracture.  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     PT Comments    Patient progressing with ambulation tolerance and able to recall cues for using walker, but still needs then due to head tipping down with postural abnormality.  Continues to be appropriate for HHPT with family assist.  Reports daughters trying to work out care at home.  PT to follow acutely.   Follow Up Recommendations  Home health PT;Supervision for mobility/OOB     Equipment Recommendations  Rolling walker with 5" wheels    Recommendations for Other Services       Precautions / Restrictions Precautions Precautions: Fall    Mobility  Bed Mobility               General bed mobility comments: up in chair  Transfers   Equipment used: Rolling walker (2 wheeled) Transfers: Sit to/from Stand Sit to Stand: Supervision            Ambulation/Gait Ambulation/Gait assistance: Min guard Gait Distance (Feet): 250 Feet Assistive device: Rolling walker (2 wheeled) Gait Pattern/deviations: Step-through pattern;Decreased stride length;Trunk flexed     General Gait Details: cues for posture, walker proximity   Stairs             Wheelchair Mobility    Modified Rankin (Stroke Patients Only)       Balance Overall balance assessment: Needs assistance   Sitting balance-Leahy Scale: Good       Standing balance-Leahy Scale: Fair Standing balance comment: using UE support for dynamic activities in standing                            Cognition  Arousal/Alertness: Awake/alert Behavior During Therapy: WFL for tasks assessed/performed Overall Cognitive Status: Within Functional Limits for tasks assessed                                        Exercises      General Comments General comments (skin integrity, edema, etc.): Demonstrated horizontal abduction with scap squeeze and shoulder IR with red t-band per pt request (could not remember what OT told her to do)      Pertinent Vitals/Pain Faces Pain Scale: Hurts a little bit Pain Location: right ribs Pain Descriptors / Indicators: Discomfort Pain Intervention(s): Ice applied;Repositioned    Home Living                      Prior Function            PT Goals (current goals can now be found in the care plan section) Progress towards PT goals: Progressing toward goals    Frequency    Min 3X/week      PT Plan Current plan remains appropriate    Co-evaluation              AM-PAC PT "6 Clicks" Mobility   Outcome Measure  Help needed turning from your back to your side while  in a flat bed without using bedrails?: A Little Help needed moving from lying on your back to sitting on the side of a flat bed without using bedrails?: A Little Help needed moving to and from a bed to a chair (including a wheelchair)?: A Little Help needed standing up from a chair using your arms (e.g., wheelchair or bedside chair)?: None Help needed to walk in hospital room?: A Little Help needed climbing 3-5 steps with a railing? : A Little 6 Click Score: 19    End of Session Equipment Utilized During Treatment: Gait belt Activity Tolerance: Patient tolerated treatment well Patient left: in chair;with call bell/phone within reach   PT Visit Diagnosis: Other abnormalities of gait and mobility (R26.89);Difficulty in walking, not elsewhere classified (R26.2)     Time: 2876-8115 PT Time Calculation (min) (ACUTE ONLY): 19 min  Charges:  $Gait Training: 8-22  mins                     Magda Kiel, [place] [phone] 08/16/2018    Reginia Naas 08/16/2018, 4:07 PM

## 2018-08-16 NOTE — Plan of Care (Signed)

## 2018-08-16 NOTE — Discharge Instructions (Addendum)
Follow with [name], MD in 5-7 days  Please obtain a CBC and INR  Please get a complete blood count and chemistry panel checked by your Primary MD at your next visit, and again as instructed by your Primary MD. Please get your medications reviewed and adjusted by your Primary MD.  Please request your Primary MD to go over all Hospital Tests and Procedure/Radiological results at the follow up, please get all Hospital records sent to your Prim MD by signing hospital release before you go home.  In some cases, there will be blood work, cultures and biopsy results pending at the time of your discharge. Please request that your primary care M.D. goes through all the records of your hospital data and follows up on these results.  If you had Pneumonia of Lung problems at the Hospital: Please get a 2 view Chest X ray done in 6-8 weeks after hospital discharge or sooner if instructed by your Primary MD.  If you have Congestive Heart Failure: Please call your Cardiologist or Primary MD anytime you have any of the following symptoms:  1) 3 pound weight gain in 24 hours or 5 pounds in 1 week  2) shortness of breath, with or without a dry hacking cough  3) swelling in the hands, feet or stomach  4) if you have to sleep on extra pillows at night in order to breathe  Follow cardiac low salt diet and 1.5 lit/day fluid restriction.  If you have diabetes Accuchecks 4 times/day, Once in AM empty stomach and then before each meal. Log in all results and show them to your primary doctor at your next visit. If any glucose reading is under 80 or above 300 call your primary MD immediately.  If you have Seizure/Convulsions/Epilepsy: Please do not drive, operate heavy machinery, participate in activities at heights or participate in high speed sports until you have seen by Primary MD or a Neurologist and advised to do so again. Per [facility] statutes, patients with seizures are not allowed to  drive until they have been seizure-free for six months.  Use caution when using heavy equipment or power tools. Avoid working on ladders or at heights. Take showers instead of baths. Ensure the water temperature is not too high on the home water heater. Do not go swimming alone. Do not lock yourself in a room alone (i.e. bathroom). When caring for infants or small children, sit down when holding, feeding, or changing them to minimize risk of injury to the child in the event you have a seizure. Maintain good sleep hygiene. Avoid alcohol.   If you had Gastrointestinal Bleeding: Please ask your Primary MD to check a complete blood count within one week of discharge or at your next visit. Your endoscopic/colonoscopic biopsies that are pending at the time of discharge, will also need to followed by your Primary MD.  Get Medicines reviewed and adjusted. Please take all your medications with you for your next visit with your Primary MD  Please request your Primary MD to go over all hospital tests and procedure/radiological results at the follow up, please ask your Primary MD to get all Hospital records sent to his/her office.  If you experience worsening of your admission symptoms, develop shortness of breath, life threatening emergency, suicidal or homicidal thoughts you must seek medical attention immediately by calling 911 or calling your MD immediately  if symptoms less severe.  You must read complete instructions/literature along with all the possible adverse  reactions/side effects for all the Medicines you take and that have been prescribed to you. Take any new Medicines after you have completely understood and accpet all the possible adverse reactions/side effects.   Do not drive or operate heavy machinery when taking Pain medications.   Do not take more than prescribed Pain, Sleep and Anxiety Medications  Special Instructions: If you have smoked or chewed Tobacco  in the last 2 yrs please stop  smoking, stop any regular Alcohol  and or any Recreational drug use.  Wear Seat belts while driving.  Please note You were cared for by a hospitalist during your hospital stay. If you have any questions about your discharge medications or the care you received while you were in the hospital after you are discharged, you can call the unit and asked to speak with the hospitalist on call if the hospitalist that took care of you is not available. Once you are discharged, your primary care physician will handle any further medical issues. Please note that NO REFILLS for any discharge medications will be authorized once you are discharged, as it is imperative that you return to your primary care physician (or establish a relationship with a primary care physician if you do not have one) for your aftercare needs so that they can reassess your need for medications and monitor your lab values.  You can reach the hospitalist office at phone [phone] or fax [phone]   If you do not have a primary care physician, you can call [phone] for a physician referral.  Activity: As tolerated with Full fall precautions use walker/cane & assistance as needed    Diet: regular  Disposition Home

## 2018-08-17 DIAGNOSIS — S2241XD Multiple fractures of ribs, right side, subsequent encounter for fracture with routine healing: Secondary | ICD-10-CM

## 2018-08-17 DIAGNOSIS — M159 Polyosteoarthritis, unspecified: Secondary | ICD-10-CM

## 2018-08-17 DIAGNOSIS — Z87440 Personal history of urinary (tract) infections: Secondary | ICD-10-CM

## 2018-08-17 DIAGNOSIS — K5900 Constipation, unspecified: Secondary | ICD-10-CM

## 2018-08-17 DIAGNOSIS — K219 Gastro-esophageal reflux disease without esophagitis: Secondary | ICD-10-CM

## 2018-08-17 DIAGNOSIS — I5189 Other ill-defined heart diseases: Secondary | ICD-10-CM

## 2018-08-17 DIAGNOSIS — S301XXD Contusion of abdominal wall, subsequent encounter: Secondary | ICD-10-CM

## 2018-08-17 DIAGNOSIS — E7800 Pure hypercholesterolemia, unspecified: Secondary | ICD-10-CM

## 2018-08-17 LAB — BASIC METABOLIC PANEL
Anion gap: 7 (ref 5–15)
BUN: 11 mg/dL (ref 8–23)
CO2: 26 mmol/L (ref 22–32)
Calcium: 8.6 mg/dL — ABNORMAL LOW (ref 8.9–10.3)
Chloride: 103 mmol/L (ref 98–111)
Creatinine, Ser: 0.75 mg/dL (ref 0.44–1.00)
GFR calc Af Amer: >60 mL/min (ref >60)
GFR calc non Af Amer: >60 mL/min (ref >60)
Glucose, Bld: 100 mg/dL — ABNORMAL HIGH (ref 70–99)
Potassium: 4.1 mmol/L (ref 3.5–5.1)
Sodium: 136 mmol/L (ref 135–145)

## 2018-08-17 LAB — CBC
HCT: 31.9 % — ABNORMAL LOW (ref 36.0–46.0)
Hemoglobin: 10.5 g/dL — ABNORMAL LOW (ref 12.0–15.0)
MCH: 30.9 pg (ref 26.0–34.0)
MCHC: 32.9 g/dL (ref 30.0–36.0)
MCV: 93.8 fL (ref 80.0–100.0)
Platelets: 148 10*3/uL — ABNORMAL LOW (ref 150–400)
RBC: 3.4 MIL/uL — ABNORMAL LOW (ref 3.87–5.11)
RDW: 13.2 % (ref 11.5–15.5)
WBC: 5.6 10*3/uL (ref 4.0–10.5)
nRBC: 0 % (ref 0.0–0.2)

## 2018-08-17 LAB — PROTIME-INR
INR: 1.4 — ABNORMAL HIGH (ref 0.8–1.2)
Prothrombin Time: 16.9 seconds — ABNORMAL HIGH (ref 11.4–15.2)

## 2018-08-17 LAB — GLUCOSE, CAPILLARY: Glucose-Capillary: 107 mg/dL — ABNORMAL HIGH (ref 70–99)

## 2018-08-17 MED ORDER — DOCUSATE SODIUM 100 MG PO CAPS
200.0000 mg | ORAL_CAPSULE | Freq: Once | ORAL | Status: AC
  Administered 2018-08-17: 200 mg via ORAL
  Filled 2018-08-17: qty 2

## 2018-08-17 MED ORDER — LACTULOSE 10 GM/15ML PO SOLN
20.0000 g | Freq: Three times a day (TID) | ORAL | Status: DC
  Administered 2018-08-17: 20 g via ORAL
  Filled 2018-08-17 (×2): qty 30

## 2018-08-17 MED ORDER — BISACODYL 10 MG RE SUPP
10.0000 mg | Freq: Once | RECTAL | Status: AC
  Administered 2018-08-17: 10 mg via RECTAL
  Filled 2018-08-17: qty 1

## 2018-08-17 MED ORDER — WARFARIN SODIUM 7.5 MG PO TABS
7.5000 mg | ORAL_TABLET | Freq: Once | ORAL | Status: AC
  Administered 2018-08-17: 7.5 mg via ORAL
  Filled 2018-08-17: qty 1

## 2018-08-17 NOTE — Progress Notes (Signed)
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  On initial imaging she was found to have significant rib fractures on the right side as well as concussion on the right flank presumably involving her right kidney given onset of hematuria after the fall  The patient states that today her urine is clear. She has not had a BM in 3 days.  Consultants  . General Surgery  Procedures  . None  Antibiotics   Anti-infectives (From admission, onward)   Start     Dose/Rate Route Frequency Ordered Stop   08/16/18 0000  cefpodoxime (VANTIN) 200 MG tablet     200 mg Oral 2 times daily 08/16/18 1003     08/13/18 1715  cefTRIAXone (ROCEPHIN) 1 g in sodium chloride 0.9 % 100 mL IVPB     1 g 200 mL/hr over 30 Minutes Intravenous Every 24 hours 08/13/18 1709      .  Marland Kitchen   Subjective  The patient is siting up in a chair at bedside. She denies any new complaints. She states that her urine is now clear. She has not had a BM in 3 days.  Objective   Vitals:  Vitals:   08/17/18 0424 08/17/18 1350  BP: (!) 128/56 (!) 131/59  Pulse: 76 69  Resp: 18 18  Temp: 98.5 F (36.9 C) 98.2 F (36.8 C)  SpO2: 96% 98%    Exam:  Constitutional:  . The patient is awake, alert, and oriented x 3. No acute distress. Respiratory:  . No increased work of breathing. . No wheezes, rales, or rhonchi. . No tactile fremitus. Cardiovascular:  . Regular, rate, and rhythm. . No murmurs, ectopy, or gallups. . No lateral PMI. No thrills. Abdomen:  . Abdomen is soft, non-tender, non-distended. . No hernias, masses, or organomegaly. . No tactile fremitus. Musculoskeletal:  . No cyanosis, clubbing, or edema Skin:  . No rashes,  lesions, ulcers . palpation of skin: no induration or nodules Neurologic:  . CN 2-12 intact . Sensation all 4 extremities intact Psychiatric:  . Mental status o Mood, affect appropriate o Orientation to person, place, time  . judgment and insight appear intact    I have personally reviewed the following:   Today's Data  . Vitals, BMP, CBC   Scheduled Meds: . acetaminophen  1,000 mg Oral TID  . atorvastatin  10 mg Oral q1800  . brimonidine  1 drop Both Eyes BID  . docusate sodium  100 mg Oral BID  . lactulose  20 g Oral TID  . latanoprost  1 drop Both Eyes QHS  . levothyroxine  25 mcg Oral QAC breakfast  . losartan  50 mg Oral Daily  . metoprolol tartrate  25 mg Oral BID  . polyethylene glycol  17 g Oral Daily  . senna-docusate  2 tablet Oral BID  . sodium chloride flush  10-40 mL Intracatheter Q12H  . warfarin  7.5 mg Oral ONCE-1800  . Warfarin - Pharmacist Dosing Inpatient   Does not apply q1800   Continuous Infusions: . cefTRIAXone (ROCEPHIN)  IV 1 g (08/16/18 1731)    Principal Problem:   Multiple rib fractures, right 10-12 Active Problems:   Essential hypertension   History of DVT (deep  vein thrombosis)   GERD   Long term (current) use of anticoagulants   Hyperlipidemia   Chronic idiopathic thrombocytopenia (HCC)   Diastolic dysfunction   Factor V deficiency ([place])   History of respiratory failure   Hypothyroidism (acquired)   Insomnia   Osteoarthritis, multiple sites   Osteoporosis   History of recurrent UTIs   Aortic stenosis, mild   Gross hematuria   Traumatic hematoma of flank   Fall against sharp object   Hematuria   LOS: 6 days    A & P   Fall with multiple rib fractures, L1 transverse process fracture: This is after mechanical fall without hitting head or losing consciousness. CT scan showed fractures of the ribs 10-12 on the right as well as L1 transverse process fracture. Continue pain control, incentive spirometry. PT recommends home  health. Trauma surgery consulted, signed off yesterday.  Gross hematuria due to kidney concussion: Resolved. This developed after the fall. CT scan does not show any injury to the kidneys, ureter, bladder.  Discussed with Dr. Junious Silk with urology, she had her Coumadin held for 48 hours and received vitamin K, currently INR is 1.2 and will resume Coumadin.  Constipation: No BM for several days. Will continue lactulose, stool softeners, and repeat enema if necessary. Initially patient felt ready to go home however has not had any success with suppositories and is feeling quite uncomfortable and bloated.  Factor V deficiency, history of DVT/PE: Difficult situation, INR is 1.4 this morning, have resumed Coumadin without heparin bridging, will closely monitor hemoglobin as well as hematuria.  Thrombocytopenia: Platelets stable at 148.  Hypertension: Continue metoprolol and losartan, blood pressure is stable.  Hypothyroidism: Continue Synthroid.  Chronic diastolic CHF: Appears compensated  Anemia: Concern for ABLA, daily CBC, hemoglobin stable  UTI: cultures pending, she was symptomatic  DVT prophylaxis: Coumadin Code Status: Full Code Family Communication: None available. Disposition Plan: Home with home health.   Robin Digilio, DO [facility] Direct contact: see [email or web address]  7PM-7AM contact night coverage as above 08/17/2018, 3:39 PM  LOS: 6 days

## 2018-08-17 NOTE — Progress Notes (Signed)
[place] for Warfarin & Heparin bridge Indication: DVT, hx Factor V Leiden deficiency  Allergies  Allergen Reactions  . Codeine Nausea Only  . Erythromycin Nausea And Vomiting   Patient Measurements: Height: 5\' 2"  (157.5 cm) Weight: 128 lb 12 oz (58.4 kg) IBW/kg (Calculated) : 50.1  Vital Signs: Temp: 98.5 F (36.9 C) (06/26 0424) Temp Source: Oral (06/26 0424) BP: 128/56 (06/26 0424) Pulse Rate: 76 (06/26 0424)  Labs: Recent Labs    08/15/18 0241 08/16/18 0250 08/17/18 0258  HGB 10.3* 10.3* 10.5*  HCT 32.1* 31.2* 31.9*  PLT 134* 149* 148*  LABPROT 15.2 16.8* 16.9*  INR 1.2 1.4* 1.4*  CREATININE 0.99 0.77 0.75   Estimated Creatinine Clearance: 42.9 mL/min (by C-G formula based on SCr of 0.75 mg/dL).  Medical History: Past Medical History:  Diagnosis Date  . Allergic rhinitis   . Cough   . Difficulty in swallowing    w/ occasional aspiration  . DVT (deep venous thrombosis) ([place])   . Dyslipidemia   . Factor V Leiden deficiency    lifelong coumadin  . GERD (gastroesophageal reflux disease)   . History of nuclear stress test 12/28/2007   lexiscan; low risk   . HTN (hypertension)   . PND (post-nasal drip)   . Pulmonary embolism ([place])   . PVC's (premature ventricular contractions)   . Thyroid disease    Medications:  Scheduled:  . acetaminophen  1,000 mg Oral TID  . atorvastatin  10 mg Oral q1800  . brimonidine  1 drop Both Eyes BID  . docusate sodium  100 mg Oral BID  . latanoprost  1 drop Both Eyes QHS  . levothyroxine  25 mcg Oral QAC breakfast  . losartan  50 mg Oral Daily  . metoprolol tartrate  25 mg Oral BID  . polyethylene glycol  17 g Oral Daily  . senna-docusate  2 tablet Oral BID  . sodium chloride flush  10-40 mL Intracatheter Q12H  . Warfarin - Pharmacist Dosing Inpatient   Does not apply q1800   Assessment:  52 yoF admit 6/19 after fall at [employer], R flank pain, rib fx > hematuria - but no renal  injury per CT  PMH: hx DVT/PE, Factor V Leiden on Warf 3.75mg  MWF, 7.5mg  other days, LD 6/18, dCHF, HTN, hypoThyroid, HPL, chronic thrombocytopenia  Goal of Therapy:  INR 2-3 Monitor platelets by anticoagulation protocol: Yes   Today, 08/17/2018 -INR this am still at 1.4 after 2.5, 5, 5, 6 mg Warfarin -Po intake much improved  -Hgb 10.5, (unchanged) Plt 148 (chronically low) - trending up - no hematuria  -6/21 INR = 4.3, VitK 2.5mg  po given 6/21 >> 1.9 (6/22) -Added IV Heparin for bridging to therapeutic INR 6/23, but then discontinued for hematuria, Warfarin was given 6/23, ok to continue 6/24 per MD   Plan:  Warfarin 7.5mg  today at 1800 Daily INR  Monitor CBC, s/s bleed  Minda Ditto PharmD [phone] 08/17/2018,11:19 AM

## 2018-08-17 NOTE — Progress Notes (Signed)
Occupational Therapy Treatment Patient Details Name: Robin Harrell MRN: [id number] DOB: 10-04-1933 Today's Date: 08/17/2018    History of present illness 85 y/o female presenting to ED after fall and R flank pain. Imaging reveals: right rib fractures 10 through 12 and associated subcutaneous contusion, as well as nondisplaced right L1 transverse process fracture.  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    OT comments  This 85 yo female admitted with above presents to acute OT making progress towards toilet transfers, hygiene, and clothing. She will continue to benefit from acute OT with follow up HHOT and 24 hour S/prn A.  Follow Up Recommendations  Home health OT;Supervision/Assistance - 24 hour    Equipment Recommendations  3 in 1 bedside commode       Precautions / Restrictions Precautions Precautions: Fall Restrictions Weight Bearing Restrictions: No       Mobility Bed Mobility               General bed mobility comments: up in chair  Transfers Overall transfer level: Needs assistance Equipment used: None   Sit to Stand: Min guard         General transfer comment: min guard A to ambulate 10 feet without AD    Balance Overall balance assessment: Needs assistance Sitting-balance support: No upper extremity supported;Feet supported Sitting balance-Leahy Scale: Good     Standing balance support: No upper extremity supported;During functional activity Standing balance-Leahy Scale: Fair Standing balance comment: standing at sink to wash hands                           ADL either performed or assessed with clinical judgement   ADL Overall ADL's : Needs assistance/impaired     Grooming: Min guard;Standing;Wash/dry hands               Lower Body Dressing: Min guard;Sit to/from stand   Toilet Transfer: Min guard;Ambulation;Comfort height toilet;Grab  bars Toilet Transfer Details (indicate cue type and reason): no AD Toileting- Clothing Manipulation and Hygiene: Min guard;Sit to/from stand         General ADL Comments: Spoke to pt's dtr over phone about a shower seat for patient and grab bar in shower stall     Vision Baseline Vision/History: No visual deficits Patient Visual Report: No change from baseline            Cognition   Behavior During Therapy: WFL for tasks assessed/performed Overall Cognitive Status: Within Functional Limits for tasks assessed                                                     Pertinent Vitals/ Pain       Pain Assessment: No/denies pain         Frequency  Min 2X/week        Progress Toward Goals  OT Goals(current goals can now be found in the care plan section)  Progress towards OT goals: Progressing toward goals     Plan Discharge plan remains appropriate;Frequency remains appropriate       AM-PAC OT "6 Clicks" Daily Activity     Outcome Measure   Help from another person eating meals?: None Help from another person taking care of personal  grooming?: A Little Help from another person toileting, which includes using toliet, bedpan, or urinal?: A Little Help from another person bathing (including washing, rinsing, drying)?: A Little Help from another person to put on and taking off regular upper body clothing?: A Little Help from another person to put on and taking off regular lower body clothing?: A Little 6 Click Score: 19    End of Session Equipment Utilized During Treatment: Gait belt  OT Visit Diagnosis: Other abnormalities of gait and mobility (R26.89);Muscle weakness (generalized) (M62.81)   Activity Tolerance Patient tolerated treatment well   Patient Left in chair;with call bell/phone within reach(nursing had pt up in recliner upon my arrival, no alarm on chair)   Nurse Communication          Time: 8177-1165 OT Time Calculation (min):  57 min  Charges: OT General Charges $OT Visit: 1 Visit OT Treatments $Self Care/Home Management : 53-67 mins  [place], OTR/L Acute Rehab Services Pager [phone] Office [phone]      Robin Harrell 08/17/2018, 5:28 PM

## 2018-08-18 DIAGNOSIS — Z8709 Personal history of other diseases of the respiratory system: Secondary | ICD-10-CM

## 2018-08-18 DIAGNOSIS — M810 Age-related osteoporosis without current pathological fracture: Secondary | ICD-10-CM

## 2018-08-18 DIAGNOSIS — W01119D Fall on same level from slipping, tripping and stumbling with subsequent striking against unspecified sharp object, subsequent encounter: Secondary | ICD-10-CM

## 2018-08-18 DIAGNOSIS — G4700 Insomnia, unspecified: Secondary | ICD-10-CM

## 2018-08-18 LAB — CBC
HCT: 30.3 % — ABNORMAL LOW (ref 36.0–46.0)
Hemoglobin: 10 g/dL — ABNORMAL LOW (ref 12.0–15.0)
MCH: 31 pg (ref 26.0–34.0)
MCHC: 33 g/dL (ref 30.0–36.0)
MCV: 93.8 fL (ref 80.0–100.0)
Platelets: 152 10*3/uL (ref 150–400)
RBC: 3.23 MIL/uL — ABNORMAL LOW (ref 3.87–5.11)
RDW: 13.5 % (ref 11.5–15.5)
WBC: 6.8 10*3/uL (ref 4.0–10.5)
nRBC: 0 % (ref 0.0–0.2)

## 2018-08-18 LAB — PROTIME-INR
INR: 1.7 — ABNORMAL HIGH (ref 0.8–1.2)
Prothrombin Time: 19.7 seconds — ABNORMAL HIGH (ref 11.4–15.2)

## 2018-08-18 LAB — GLUCOSE, CAPILLARY: Glucose-Capillary: 91 mg/dL (ref 70–99)

## 2018-08-18 MED ORDER — WARFARIN SODIUM 7.5 MG PO TABS
7.5000 mg | ORAL_TABLET | Freq: Once | ORAL | Status: DC
  Filled 2018-08-18: qty 1

## 2018-08-18 MED ORDER — CEFPODOXIME PROXETIL 200 MG PO TABS: 200.0000 mg | ORAL_TABLET | Freq: Two times a day (BID) | ORAL | 0 refills | Status: AC

## 2018-08-18 NOTE — Progress Notes (Signed)
[place] for Warfarin & Heparin bridge Indication: DVT, hx Factor V Leiden deficiency  Allergies  Allergen Reactions  . Codeine Nausea Only  . Erythromycin Nausea And Vomiting   Patient Measurements: Height: 5\' 2"  (157.5 cm) Weight: 127 lb 10.3 oz (57.9 kg) IBW/kg (Calculated) : 50.1  Vital Signs: Temp: 98.3 F (36.8 C) (06/27 0415) Temp Source: Oral (06/27 0415) BP: 109/60 (06/27 0415) Pulse Rate: 76 (06/27 0415)  Labs: Recent Labs    08/16/18 0250 08/17/18 0258 08/18/18 0318  HGB 10.3* 10.5* 10.0*  HCT 31.2* 31.9* 30.3*  PLT 149* 148* 152  LABPROT 16.8* 16.9* 19.7*  INR 1.4* 1.4* 1.7*  CREATININE 0.77 0.75  --    Estimated Creatinine Clearance: 42.9 mL/min (by C-G formula based on SCr of 0.75 mg/dL).  Medical History: Past Medical History:  Diagnosis Date  . Allergic rhinitis   . Cough   . Difficulty in swallowing    w/ occasional aspiration  . DVT (deep venous thrombosis) ([place])   . Dyslipidemia   . Factor V Leiden deficiency    lifelong coumadin  . GERD (gastroesophageal reflux disease)   . History of nuclear stress test 12/28/2007   lexiscan; low risk   . HTN (hypertension)   . PND (post-nasal drip)   . Pulmonary embolism ([place])   . PVC's (premature ventricular contractions)   . Thyroid disease    Medications:  Scheduled:  . acetaminophen  1,000 mg Oral TID  . atorvastatin  10 mg Oral q1800  . brimonidine  1 drop Both Eyes BID  . docusate sodium  100 mg Oral BID  . lactulose  20 g Oral TID  . latanoprost  1 drop Both Eyes QHS  . levothyroxine  25 mcg Oral QAC breakfast  . losartan  50 mg Oral Daily  . metoprolol tartrate  25 mg Oral BID  . polyethylene glycol  17 g Oral Daily  . senna-docusate  2 tablet Oral BID  . sodium chloride flush  10-40 mL Intracatheter Q12H  . Warfarin - Pharmacist Dosing Inpatient   Does not apply q1800   Assessment:  68 yoF admit 6/19 after fall at church, R flank pain, rib fx  > hematuria - but no renal injury per CT  PMH: hx DVT/PE, Factor V Leiden on Warf 3.75mg  MWF, 7.5mg  other days, LD 6/18, dCHF, HTN, hypoThyroid, HPL, chronic thrombocytopenia  Goal of Therapy:  INR 2-3 Monitor platelets by anticoagulation protocol: Yes   Today, 08/18/2018 -INR this am  incr to 1.7 after 2.5, 5, 5, 6, 7.5 mg Warfarin -Po intake much improved  -Hgb 10.0, low-stable,  Plt 152 (chronically low) - trending up - no hematuria  -6/21 INR = 4.3, VitK 2.5mg  po given 6/21 >> 1.9 (6/22) -Added IV Heparin for bridging to therapeutic INR 6/23, but then discontinued for hematuria, Warfarin was given 6/23, ok to continue 6/24 per MD   Plan:  Warfarin 7.5mg  today at 1800 Daily INR  Monitor CBC, s/s bleed  Minda Ditto PharmD [phone] 08/18/2018,9:29 AM

## 2018-08-18 NOTE — Progress Notes (Signed)
Robin Harrell to be D/C'd  per MD order. Discussed with the patient and all questions fully answered.  VSS, Skin clean, dry and intact without evidence of skin break down, no evidence of skin tears noted.  IV catheter discontinued intact. Site without signs and symptoms of complications. Dressing and pressure applied.  An After Visit Summary was printed and given to the patient. Patient received prescription.  D/c education completed with patient/family including follow up instructions, medication list, d/c activities limitations if indicated, with other d/c instructions as indicated by MD - patient able to verbalize understanding, all questions fully answered.   Patient instructed to return to ED, call 911, or call MD for any changes in condition.   Patient to be escorted via [place], and D/C home via private auto.

## 2018-08-18 NOTE — Discharge Summary (Signed)
Physician Discharge Summary  Robin Harrell LNL:[id number] DOB: 1933-09-16 DOA: 08/10/2018  PCP: [name], MD  Admit date: 08/10/2018 Discharge date: 08/18/2018  Recommendations for Outpatient Follow-up:  1. Patient is to follow up with PCP in 7-10 days 2. Check INR on Monday. Report results to PCP.  Follow-up Information    [facility].   Specialty: [place], [place]. Schedule an appointment as soon as possible for a visit in 4 day(s).   Specialty: Family Medicine Why: repeat blood work Sport and exercise psychologist information: [address] [place] [id number] [phone]           Discharge Diagnoses: Principal diagnosis is #1 1. Fall with multiple rib fractures 2. Gross hematuria 3. Constipation 4. Factor V deviciency 5. Thrombocytopenia 6. Hypertension 7. Hypothyroidism 8. Chronic diastolic CHF 9. Anemia  Discharge Condition: Fair  Disposition: Home  Diet recommendation: Heart Healthy  Filed Weights   08/16/18 1600 08/17/18 0424 08/18/18 0415  Weight: 60.4 kg 58.4 kg 57.9 kg    History of present illness:  Robin Harrell is a 85 y.o. 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  Patient reports that she been in her usual state of health and was having an uneventful day when she was at church this evening and tripped, striking her right side against the corner of a pew.  She did not hit her head or lose consciousness.  She typically ambulates with a cane, denies any recent lightheadedness or presyncope, denies any recent worsening in her coordination.  She has been experiencing severe pain at the right flank which prompts her presentation and she also noted some gross hematuria while in the ED, but without  any difficulty voiding.  ED Course: Upon arrival to the ED, patient is found to be afebrile, saturating low 90s on room air, and with remaining vitals stable.  Chest x-ray demonstrates displaced right rib fracture.  Radiographs of the pelvis are negative.  CT chest/abdomen/pelvis is notable for right rib fractures 10 through 12 and associated subcutaneous contusion, as well as nondisplaced right L1 transverse process fracture.  No injury to the kidneys, ureters, or bladder identified on CT.  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 On initial imaging she was found to have significant rib fractures on the right side as well as concussion on the right flank presumably involving her right kidney given onset of hematuria after the fall  The patient states that today her urine is clear. She had a BM yesterday.  Today's assessment: S: The patient is sitting up at bedside. No new complaints. O: Vitals:  Vitals:   08/18/18 0415 08/18/18 0415  BP: 109/60 109/60  Pulse: 76 76  Resp: 18 19  Temp: 98.3 F (36.8 C) 98.3 F (36.8 C)  SpO2: 96% 96%    Constitutional:   The patient is awake, alert, and oriented x 3. No acute distress. Respiratory:   No  increased work of breathing.  No wheezes, rales, or rhonchi.  No tactile fremitus. Cardiovascular:   Regular, rate, and rhythm.  No murmurs, ectopy, or gallups.  No lateral PMI. No thrills. Abdomen:   Abdomen is soft, non-tender, non-distended.  No hernias, masses, or organomegaly.  No tactile fremitus. Musculoskeletal:   No cyanosis, clubbing, or edema Skin:   No rashes,  lesions, ulcers  palpation of skin: no induration or nodules Neurologic:   CN 2-12 intact  Sensation all 4 extremities intact Psychiatric:   Mental status ? Mood, affect appropriate ? Orientation to person, place, time   judgment and insight appear intact    Discharge Instructions  Discharge Instructions    Activity as tolerated - No restrictions   Complete by: As directed    Call MD for:   Complete by: As directed    Bloody urine   Call MD for:  difficulty breathing, headache or visual disturbances   Complete by: As directed    Call MD for:  severe uncontrolled pain   Complete by: As directed    Diet - low sodium heart healthy   Complete by: As directed    Discharge instructions   Complete by: As directed    Follow up with PCP in 7-10 days. Have INR checked on 08/20/2018. Report results to PCP.   Increase activity slowly   Complete by: As directed      Allergies as of 08/18/2018      Reactions   Codeine Nausea Only   Erythromycin Nausea And Vomiting      Medication List    TAKE these medications   acetaminophen 500 MG tablet Commonly known as: TYLENOL Take 1,000 mg by mouth every 6 (six) hours as needed for moderate pain. Notes to patient: Last taken 6/25 at 9:30 am   atorvastatin 10 MG tablet Commonly known as: LIPITOR Take 1 tablet (10 mg total) by mouth daily at 6 PM. Notes to patient: 08/18/2018   brimonidine 0.15 % ophthalmic solution Commonly known as: ALPHAGAN Place 1 drop into both eyes 2 (two) times daily. Notes to patient: 08/22/7791-[id number] application   Calcium/D3 Adult Gummies 200-96.6-200 MG-MG-UNIT Chew Generic drug: Calcium-Phosphorus-Vitamin D Chew 2 tablets by mouth daily. Notes to patient: Per home use   cefpodoxime 200 MG tablet Commonly known as: VANTIN Take 1 tablet (200 mg total) by mouth 2 (two) times daily for 3 days. Notes to patient: 08/18/2018   cetirizine 10 MG tablet Commonly known as: ZYRTEC TAKE 1 TABLET BY MOUTH  EVERY DAY Notes to patient: Per home use   chlorpheniramine 4 MG tablet Commonly known as: CHLOR-TRIMETON Take 4 mg by mouth every morning. Notes to patient: Per home use   ipratropium 0.03 % nasal spray Commonly known as: ATROVENT Place 2 sprays into both nostrils 3 (three) times daily as needed for rhinitis. Notes to patient: Per home use   latanoprost 0.005 % ophthalmic solution Commonly known as: XALATAN Place 1 drop into both eyes at bedtime. Notes to patient: 08/18/2018   levothyroxine 25 MCG tablet Commonly known as: SYNTHROID TAKE 1 TABLET BY MOUTH EVERY DAY What changed: when to take this Notes to patient: 08/19/2018   losartan 50 MG tablet Commonly known as: COZAAR TAKE 1 TABLET BY MOUTH EVERY DAY Notes to patient: 08/19/2018   metoprolol tartrate 25 MG tablet Commonly known as: LOPRESSOR TAKE 1 TABLET (25 MG TOTAL) BY MOUTH 2 (TWO) TIMES DAILY. KEEP OV. What changed: additional instructions Notes to patient: 08/19/2018  multivitamin-iron-minerals-folic acid chewable tablet Chew 1 tablet by mouth daily. Notes to patient: Per home use   traMADol 50 MG tablet Commonly known as: ULTRAM Take 1 tablet (50 mg total) by mouth every 6 (six) hours as needed for moderate pain or severe pain. Notes to patient: 08/18/2018-after 12pm   vitamin E 1000 UNIT capsule Generic drug: vitamin E Take 1,000 Units by mouth daily. Notes to patient: Per home use   warfarin 7.5 MG tablet Commonly known as: COUMADIN Take as directed. If you are unsure how to take this medication, talk to your nurse or doctor. Original instructions: Take 1/2 to 1 tablet by mouth daily as directed What changed:   how much to take  how to take this  when to take this  additional instructions Notes to patient: 08/19/2018      Allergies  Allergen Reactions   Codeine Nausea Only   Erythromycin Nausea And Vomiting    The results of significant diagnostics from this hospitalization  (including imaging, microbiology, ancillary and laboratory) are listed below for reference.    Significant Diagnostic Studies: Dg Chest 2 View  Result Date: 08/12/2018 CLINICAL DATA:  85 y/o  F; multiple rib fractures. EXAM: CHEST - 2 VIEW COMPARISON:  08/11/2018 chest radiograph. 08/10/2018 chest CT. FINDINGS: Stable cardiac silhouette within normal limits given projection and technique. Aortic calcific atherosclerosis. Right 10-12 rib fractures better characterized on prior CT. No new acute fracture identified. Blunting of costal diaphragmatic angles, probably small effusions. Ill-defined bibasilar opacities. No pneumothorax. IMPRESSION: 1. Small bilateral effusions and ill-defined bibasilar opacities probably represent associated atelectasis. 2. Right 10-12 rib fractures better characterized on prior CT. No new acute fracture identified. Electronically Signed   By: Kristine Garbe M.D.   On: 08/12/2018 06:48   Ct Head Wo Contrast  Result Date: 08/10/2018 CLINICAL DATA:  Tripped and hit pew at church EXAM: CT HEAD WITHOUT CONTRAST CT CERVICAL SPINE WITHOUT CONTRAST TECHNIQUE: Multidetector CT imaging of the head and cervical spine was performed following the standard protocol without intravenous contrast. Multiplanar CT image reconstructions of the cervical spine were also generated. COMPARISON:  None. FINDINGS: CT HEAD FINDINGS Brain: No acute territorial infarction, hemorrhage or intracranial mass. Mild to moderate atrophy. Moderate small vessel ischemic changes of the white matter. Prominent ventricles felt secondary to atrophy. Vascular: No hyperdense vessels.  Carotid vascular calcification. Skull: Normal. Negative for fracture or focal lesion. Sinuses/Orbits: No acute finding. Other: None CT CERVICAL SPINE FINDINGS Alignment: No subluxation.  Facet alignment is within normal limits. Skull base and vertebrae: No acute fracture. No primary bone lesion or focal pathologic process. Soft tissues  and spinal canal: No prevertebral fluid or swelling. No visible canal hematoma. Disc levels: Moderate-to-marked diffuse degenerative changes C3 through C7 with mild degenerative changes at C2-C3. Multiple level bilateral facet degenerative change with multiple level bilateral foraminal stenosis. Upper chest: Apical scarring.  Carotid vascular calcification. Other: None IMPRESSION: 1. No CT evidence for acute intracranial abnormality. Atrophy with small vessel ischemic changes of the white matter 2. Straightening of the cervical spine with degenerative changes. No acute osseous abnormality. Electronically Signed   By: Donavan Foil M.D.   On: 08/10/2018 22:12   Ct Chest W Contrast  Result Date: 08/10/2018 CLINICAL DATA:  Fall at church with right rib pain. Abd trauma, blunt, stable; Chest trauma, blunt, low energy, neg xray, normal exam and mental status EXAM: CT CHEST, ABDOMEN, AND PELVIS WITH CONTRAST TECHNIQUE: Multidetector CT imaging of the chest, abdomen and pelvis was  performed following the standard protocol during bolus administration of intravenous contrast. CONTRAST:  170mL OMNIPAQUE IOHEXOL 300 MG/ML  SOLN COMPARISON:  Chest and pelvic radiographs earlier this day FINDINGS: CT CHEST FINDINGS Cardiovascular: No acute vascular injury. Aortic atherosclerosis and tortuosity. Coronary artery calcifications. No pericardial fluid. Mediastinum/Nodes: No mediastinal hematoma or hemorrhage. Patulous esophagus. Normal thyroid gland. Small mediastinal nodes not enlarged by size criteria. Lungs/Pleura: No pneumothorax. No evidence of pulmonary contusion. Streaky opacities in the dependent lower lobes, favoring atelectasis. Biapical pleuroparenchymal scarring. No significant pleural fluid. Musculoskeletal: Right rib fractures of ribs 10 through 12. The tenth and eleventh rib fractures are segmental and fractured both anterior and posteriorly. Twelfth rib fracture is displaced. Associated subcutaneous soft tissue  contusion in the posterior right flank. No fracture of the sternum, left ribs, included clavicles or shoulder girdles. Multilevel degenerative change in the thoracic spine. No fracture of the thoracic spine. CT ABDOMEN PELVIS FINDINGS Hepatobiliary: No hepatic injury or perihepatic hematoma. Punctate hepatic granuloma. Gallbladder is absent. Pancreas: No ductal dilatation or inflammation. Spleen: No splenic injury or perisplenic hematoma. 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 Adrenals/Urinary Tract: No adrenal hemorrhage or renal injury identified. Homogeneous renal enhancement with symmetric excretion on delayed phase imaging. Bladder is unremarkable. Stomach/Bowel: Bowel evaluation limited by a lack of contrast, mild motion artifact, and paucity of intra-abdominal fat. No evidence of bowel injury. No bowel wall thickening or inflammation. No mesenteric hematoma. Colonic diverticulosis without diverticulitis. Prior appendectomy per history. No free air. Vascular/Lymphatic: No vascular injury. Aortic atherosclerosis without aneurysm. No retroperitoneal fluid. Slight prominence of the left ovarian vein. 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 No bulky abdominopelvic adenopathy. Reproductive: Uterus and bilateral adnexa are unremarkable. Other: No free air or free fluid. Musculoskeletal: Subcutaneous soft tissue edema in the posterior right flank soft tissues. Slight large mint of right paraspinal musculature likely intramuscular edema. Nondisplaced right transverse process fracture of L1. No additional fracture of the lumbar spine. No pelvic fracture. Multilevel lumbar degenerative change in scoliosis. Pubic symphyseal degenerative change. IMPRESSION: 1. Right rib fractures 10 through 12. The tenth and eleventh rib fractures are segmental. Twelfth rib fracture is displaced. Associated  subcutaneous contusion in the posterior right flank. No pneumothorax. 2. Nondisplaced right L1 transverse process fracture. 3. No additional acute traumatic injury to the chest, abdomen, or pelvis. 4. Aortic Atherosclerosis (ICD10-I70.0). Coronary artery calcifications. Electronically Signed   By: Keith Rake M.D.   On: 08/10/2018 22:14   Ct Cervical Spine Wo Contrast  Result Date: 08/10/2018 CLINICAL DATA:  Tripped and hit pew at church EXAM: CT HEAD WITHOUT CONTRAST CT CERVICAL SPINE WITHOUT CONTRAST TECHNIQUE: Multidetector CT imaging of the head and cervical spine was performed following the standard protocol without intravenous contrast. Multiplanar CT image reconstructions of the cervical spine were also generated. COMPARISON:  None. FINDINGS: CT HEAD FINDINGS Brain: No acute territorial infarction, hemorrhage or intracranial mass. Mild to moderate atrophy. Moderate small vessel ischemic changes of the white matter. Prominent ventricles felt secondary to atrophy. Vascular: No hyperdense vessels.  Carotid vascular calcification. Skull: Normal. Negative for fracture or focal lesion. Sinuses/Orbits: No acute finding. Other: None CT CERVICAL SPINE FINDINGS Alignment: No subluxation.  Facet alignment is within normal limits. Skull base and vertebrae: No acute fracture. No primary bone lesion or focal pathologic process. Soft tissues and spinal canal: No prevertebral fluid or swelling. No visible canal hematoma. Disc levels: Moderate-to-marked  diffuse degenerative changes C3 through C7 with mild degenerative changes at C2-C3. Multiple level bilateral facet degenerative change with multiple level bilateral foraminal stenosis. Upper chest: Apical scarring.  Carotid vascular calcification. Other: None IMPRESSION: 1. No CT evidence for acute intracranial abnormality. Atrophy with small vessel ischemic changes of the white matter 2. Straightening of the cervical spine with degenerative changes. No acute osseous  abnormality. Electronically Signed   By: Donavan Foil M.D.   On: 08/10/2018 22:12   Ct Abdomen Pelvis W Contrast  Result Date: 08/10/2018 CLINICAL DATA:  Fall at church with right rib pain. Abd trauma, blunt, stable; Chest trauma, blunt, low energy, neg xray, normal exam and mental status EXAM: CT CHEST, ABDOMEN, AND PELVIS WITH CONTRAST TECHNIQUE: Multidetector CT imaging of the chest, abdomen and pelvis was performed following the standard protocol during bolus administration of intravenous contrast. CONTRAST:  156mL OMNIPAQUE IOHEXOL 300 MG/ML  SOLN COMPARISON:  Chest and pelvic radiographs earlier this day FINDINGS: CT CHEST FINDINGS Cardiovascular: No acute vascular injury. Aortic atherosclerosis and tortuosity. Coronary artery calcifications. No pericardial fluid. Mediastinum/Nodes: No mediastinal hematoma or hemorrhage. Patulous esophagus. Normal thyroid gland. Small mediastinal nodes not enlarged by size criteria. Lungs/Pleura: No pneumothorax. No evidence of pulmonary contusion. Streaky opacities in the dependent lower lobes, favoring atelectasis. Biapical pleuroparenchymal scarring. No significant pleural fluid. Musculoskeletal: Right rib fractures of ribs 10 through 12. The tenth and eleventh rib fractures are segmental and fractured both anterior and posteriorly. Twelfth rib fracture is displaced. Associated subcutaneous soft tissue contusion in the posterior right flank. No fracture of the sternum, left ribs, included clavicles or shoulder girdles. Multilevel degenerative change in the thoracic spine. No fracture of the thoracic spine. CT ABDOMEN PELVIS FINDINGS Hepatobiliary: No hepatic injury or perihepatic hematoma. Punctate hepatic granuloma. Gallbladder is absent. Pancreas: No ductal dilatation or inflammation. Spleen: No splenic injury or perisplenic hematoma. 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 Adrenals/Urinary Tract: No adrenal  hemorrhage or renal injury identified. Homogeneous renal enhancement with symmetric excretion on delayed phase imaging. Bladder is unremarkable. Stomach/Bowel: Bowel evaluation limited by a lack of contrast, mild motion artifact, and paucity of intra-abdominal fat. No evidence of bowel injury. No bowel wall thickening or inflammation. No mesenteric hematoma. Colonic diverticulosis without diverticulitis. Prior appendectomy per history. No free air. Vascular/Lymphatic: No vascular injury. Aortic atherosclerosis without aneurysm. No retroperitoneal fluid. Slight prominence of the left ovarian vein. 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 No bulky abdominopelvic adenopathy. Reproductive: Uterus and bilateral adnexa are unremarkable. Other: No free air or free fluid. Musculoskeletal: Subcutaneous soft tissue edema in the posterior right flank soft tissues. Slight large mint of right paraspinal musculature likely intramuscular edema. Nondisplaced right transverse process fracture of L1. No additional fracture of the lumbar spine. No pelvic fracture. Multilevel lumbar degenerative change in scoliosis. Pubic symphyseal degenerative change. IMPRESSION: 1. Right rib fractures 10 through 12. The tenth and eleventh rib fractures are segmental. Twelfth rib fracture is displaced. Associated subcutaneous contusion in the posterior right flank. No pneumothorax. 2. Nondisplaced right L1 transverse process fracture. 3. No additional acute traumatic injury to the chest, abdomen, or pelvis. 4. Aortic Atherosclerosis (ICD10-I70.0). Coronary artery calcifications. Electronically Signed   By: Keith Rake M.D.   On: 08/10/2018 22:14   Dg Pelvis Portable  Result Date: 08/10/2018 CLINICAL DATA:  Post fall with right back pain. Tripped at church striking a pew. EXAM: PORTABLE PELVIS  1-2 VIEWS COMPARISON:  None. FINDINGS: The cortical margins of the bony  pelvis are intact. No fracture. Chronic changes at the pubic symphysis without evidence of widening. The sacroiliac joints are congruent. Age related acetabular spurring. Both femoral heads are well-seated in the respective acetabula. IMPRESSION: No pelvic fracture. Electronically Signed   By: Keith Rake M.D.   On: 08/10/2018 20:24   Dg Chest Port 1 View  Result Date: 08/11/2018 CLINICAL DATA:  Pneumothorax. EXAM: PORTABLE CHEST 1 VIEW COMPARISON:  08/10/2018 and chest CT 08/10/2018 FINDINGS: Lungs are adequately inflated with subtle blunting of the left costophrenic angle likely small amount left pleural fluid. Subtle hazy density over the left mid to lower lung likely atelectasis. Right lung is clear. No pneumothorax. Cardiomediastinal silhouette and remainder of the exam is unchanged. IMPRESSION: Subtle hazy density over the left mid to lower lung likely atelectasis. Suggestion of small amount left pleural fluid. No pneumothorax. Electronically Signed   By: Marin Olp M.D.   On: 08/11/2018 09:16   Dg Chest Port 1 View  Result Date: 08/10/2018 CLINICAL DATA:  Right lower chest pain after fall. EXAM: PORTABLE CHEST 1 VIEW COMPARISON:  03/23/2016 FINDINGS: Cardiomediastinal silhouette is mildly enlarged. Calcific atherosclerotic disease of the aorta. There is no evidence of focal airspace consolidation, pleural effusion or pneumothorax. Displaced right-sided rib fracture, likely lateral tenth rib. Soft tissues are grossly normal. IMPRESSION: 1. Displaced right-sided rib fracture, likely lateral tenth rib. 2. No evidence of pneumothorax. Electronically Signed   By: Fidela Salisbury M.D.   On: 08/10/2018 20:24    Microbiology: Recent Results (from the past 240 hour(s))  SARS Coronavirus 2 (CEPHEID - Performed in [place] hospital lab), Hosp Order     Status: None   Collection Time: 08/10/18 10:33 PM   Specimen: Nasopharyngeal Swab  Result Value Ref Range Status   SARS Coronavirus 2  NEGATIVE NEGATIVE Final    Comment: (NOTE) If result is NEGATIVE SARS-CoV-2 target nucleic acids are NOT DETECTED. The SARS-CoV-2 RNA is generally detectable in upper and lower  respiratory specimens during the acute phase of infection. The lowest  concentration of SARS-CoV-2 viral copies this assay can detect is 250  copies / mL. A negative result does not preclude SARS-CoV-2 infection  and should not be used as the sole basis for treatment or other  patient management decisions.  A negative result may occur with  improper specimen collection / handling, submission of specimen other  than nasopharyngeal swab, presence of viral mutation(s) within the  areas targeted by this assay, and inadequate number of viral copies  (<250 copies / mL). A negative result must be combined with clinical  observations, patient history, and epidemiological information. If result is POSITIVE SARS-CoV-2 target nucleic acids are DETECTED. The SARS-CoV-2 RNA is generally detectable in upper and lower  respiratory specimens dur ing the acute phase of infection.  Positive  results are indicative of active infection with SARS-CoV-2.  Clinical  correlation with patient history and other diagnostic information is  necessary to determine patient infection status.  Positive results do  not rule out bacterial infection or co-infection with other viruses. If result is PRESUMPTIVE POSTIVE SARS-CoV-2 nucleic acids MAY BE PRESENT.   A presumptive positive result was obtained on the submitted specimen  and confirmed on repeat testing.  While 2019 novel coronavirus  (SARS-CoV-2) nucleic acids may be present in the submitted sample  additional confirmatory testing may be necessary for epidemiological  and / or clinical management purposes  to differentiate between  SARS-CoV-2 and other Sarbecovirus currently known to infect humans.  If clinically indicated additional testing with an alternate test  methodology [phone])  is advised. The SARS-CoV-2 RNA is generally  detectable in upper and lower respiratory sp ecimens during the acute  phase of infection. The expected result is Negative. Fact Sheet for Patients:  StrictlyIdeas.no Fact Sheet for Healthcare Providers: BankingDealers.co.za This test is not yet approved or cleared by the [place] FDA and has been authorized for detection and/or diagnosis of SARS-CoV-2 by FDA under an Emergency Use Authorization (EUA).  This EUA will remain in effect (meaning this test can be used) for the duration of the COVID-19 declaration under Section 564(b)(1) of the Act, 21 U.S.C. section 360bbb-3(b)(1), unless the authorization is terminated or revoked sooner. Performed at [facility], [address], [place], [place] [address]      Labs: Basic Metabolic Panel: Recent Labs  Lab 08/12/18 0306 08/13/18 0247 08/15/18 0241 08/16/18 0250 08/17/18 0258  NA 137 138 136 136 136  K 4.0 4.3 4.3 4.3 4.1  CL 105 106 103 103 103  CO2 26 25 25 25 26   GLUCOSE 85 108* 107* 96 100*  BUN 15 15 17 13 11   CREATININE 0.84 0.89 0.99 0.77 0.75  CALCIUM 8.4* 8.7* 8.3* 8.5* 8.6*   Liver Function Tests: No results for input(s): AST, ALT, ALKPHOS, BILITOT, PROT, ALBUMIN in the last 168 hours. No results for input(s): LIPASE, AMYLASE in the last 168 hours. No results for input(s): AMMONIA in the last 168 hours. CBC: Recent Labs  Lab 08/14/18 0329 08/15/18 0241 08/16/18 0250 08/17/18 0258 08/18/18 0318  WBC 6.4 6.3 5.5 5.6 6.8  HGB 10.5* 10.3* 10.3* 10.5* 10.0*  HCT 31.5* 32.1* 31.2* 31.9* 30.3*  MCV 93.2 94.7 94.0 93.8 93.8  PLT 119* 134* 149* 148* 152   Cardiac Enzymes: No results for input(s): CKTOTAL, CKMB, CKMBINDEX, TROPONINI in the last 168 hours. BNP: BNP (last 3 results) No results for input(s): BNP in the last 8760 hours.  ProBNP (last 3 results) No results for input(s): PROBNP in the  last 8760 hours.  CBG: Recent Labs  Lab 08/14/18 0802 08/15/18 0802 08/16/18 0813 08/17/18 0841 08/18/18 0748  GLUCAP 84 99 95 107* 91    Principal Problem:   Multiple rib fractures, right 10-12 Active Problems:   Essential hypertension   History of DVT (deep vein thrombosis)   GERD   Long term (current) use of anticoagulants   Hyperlipidemia   Chronic idiopathic thrombocytopenia (HCC)   Diastolic dysfunction   Factor V deficiency (HCC)   History of respiratory failure   Hypothyroidism (acquired)   Insomnia   Osteoarthritis, multiple sites   Osteoporosis   History of recurrent UTIs   Aortic stenosis, mild   Gross hematuria   Traumatic hematoma of flank   Fall against sharp object   Hematuria   Time coordinating discharge: 38 minutes.  Signed:        Treyce Spillers, DO [facility]  08/18/2018, 4:38 PM

## 2018-08-19 DIAGNOSIS — S2241XD Multiple fractures of ribs, right side, subsequent encounter for fracture with routine healing: Secondary | ICD-10-CM | POA: Diagnosis not present

## 2018-08-19 DIAGNOSIS — W19XXXD Unspecified fall, subsequent encounter: Secondary | ICD-10-CM | POA: Diagnosis not present

## 2018-08-19 DIAGNOSIS — S37011D Minor contusion of right kidney, subsequent encounter: Secondary | ICD-10-CM | POA: Diagnosis not present

## 2018-08-19 DIAGNOSIS — Z86718 Personal history of other venous thrombosis and embolism: Secondary | ICD-10-CM | POA: Diagnosis not present

## 2018-08-19 DIAGNOSIS — I5032 Chronic diastolic (congestive) heart failure: Secondary | ICD-10-CM | POA: Diagnosis not present

## 2018-08-19 DIAGNOSIS — Z5181 Encounter for therapeutic drug level monitoring: Secondary | ICD-10-CM | POA: Diagnosis not present

## 2018-08-19 DIAGNOSIS — D682 Hereditary deficiency of other clotting factors: Secondary | ICD-10-CM | POA: Diagnosis not present

## 2018-08-19 DIAGNOSIS — I110 Hypertensive heart disease with heart failure: Secondary | ICD-10-CM | POA: Diagnosis not present

## 2018-08-19 DIAGNOSIS — Z7901 Long term (current) use of anticoagulants: Secondary | ICD-10-CM | POA: Diagnosis not present

## 2018-08-19 DIAGNOSIS — M810 Age-related osteoporosis without current pathological fracture: Secondary | ICD-10-CM | POA: Diagnosis not present

## 2018-08-19 DIAGNOSIS — R319 Hematuria, unspecified: Secondary | ICD-10-CM | POA: Diagnosis not present

## 2018-08-20 ENCOUNTER — Telehealth: Payer: Self-pay | Admitting: Family Medicine

## 2018-08-20 ENCOUNTER — Other Ambulatory Visit: Payer: Self-pay | Admitting: *Deleted

## 2018-08-20 DIAGNOSIS — S2241XD Multiple fractures of ribs, right side, subsequent encounter for fracture with routine healing: Secondary | ICD-10-CM

## 2018-08-20 NOTE — Telephone Encounter (Signed)
Please advise 

## 2018-08-20 NOTE — Telephone Encounter (Signed)
LMOVM with details that home health has been placed

## 2018-08-20 NOTE — Telephone Encounter (Signed)
Pts daughter called requesting a home health aid. Pts daughter states she is needing help showering, and is requesting orders be put in. Please advise.

## 2018-08-20 NOTE — Telephone Encounter (Signed)
Please order. Pt had fall and multiple rib fractures.

## 2018-08-21 ENCOUNTER — Telehealth: Payer: Self-pay | Admitting: Physical Therapy

## 2018-08-21 ENCOUNTER — Telehealth: Payer: Self-pay

## 2018-08-21 DIAGNOSIS — S2241XD Multiple fractures of ribs, right side, subsequent encounter for fracture with routine healing: Secondary | ICD-10-CM | POA: Diagnosis not present

## 2018-08-21 DIAGNOSIS — D682 Hereditary deficiency of other clotting factors: Secondary | ICD-10-CM | POA: Diagnosis not present

## 2018-08-21 DIAGNOSIS — R319 Hematuria, unspecified: Secondary | ICD-10-CM | POA: Diagnosis not present

## 2018-08-21 DIAGNOSIS — W19XXXD Unspecified fall, subsequent encounter: Secondary | ICD-10-CM | POA: Diagnosis not present

## 2018-08-21 DIAGNOSIS — S37011D Minor contusion of right kidney, subsequent encounter: Secondary | ICD-10-CM | POA: Diagnosis not present

## 2018-08-21 DIAGNOSIS — I110 Hypertensive heart disease with heart failure: Secondary | ICD-10-CM | POA: Diagnosis not present

## 2018-08-21 NOTE — Telephone Encounter (Signed)
Copied from [place] [phone]. Topic: Quick Communication - Home Health Verbal Orders >> Aug 21, 2018  3:27 PM Virl Axe D wrote: Caller/Agency: [facility] Number: [phone] Requesting OT/PT/Skilled Nursing/Social Work/Speech Therapy: OT Frequency: 2 week 1 / 1 week 1

## 2018-08-21 NOTE — Telephone Encounter (Signed)
Per Chart Review:  PCP: [name], MD  Admit date: 08/10/2018 Discharge date: 08/18/2018  Recommendations for Outpatient Follow-up:  1. Patient is to follow up with PCP in 7-10 days 2. Check INR on Monday. Report results to PCP.     Follow-up Information    [facility].   Specialty: [place], [place]. Schedule an appointment as soon as possible for a visit in 4 day(s).   Specialty: Family Medicine Why: repeat blood work Sport and exercise psychologist information: [address] [place] [phone]           Discharge Diagnoses: Principal diagnosis is #1 1. Fall with multiple rib fractures 2. Gross hematuria 3. Constipation 4. Factor V deviciency 5. Thrombocytopenia 6. Hypertension 7. Hypothyroidism 8. Chronic diastolic CHF 9. Anemia  Discharge Condition: Fair  Disposition: Home  Diet recommendation: Heart Healthy       Filed Weights   08/16/18 1600 08/17/18 0424 08/18/18 0415  Weight: 60.4 kg 58.4 kg 57.9 kg    History of present illness:  Robin J Whichardis a 85 y.o.femalewith medical history significant forfactor V deficiency with history of DVT on warfarin, chronic diastolic CHF, hypertension, and hypothyroidism, now presenting to the emergency department with right flank pain after a fall.Patient reports that she been in her usual state of health and was having an uneventful day when she was at church this evening and tripped, striking her right side against the corner of a pew. She did not hit her head or lose consciousness. She typically ambulates with a cane, denies any recent lightheadedness or presyncope, denies any recent worsening in her coordination.She has been experiencing severe pain at the right flank which prompts her presentation and she also noted some gross hematuria while in the ED, but without any difficulty  voiding.  _______________________________________________________________  Per Telephone Call:  Transition Care Management Follow-up Telephone Call   Date discharged?  08/18/2018   How have you been since you were released from the hospital? Slow moving, painful, daughter is there helping her.   Do you understand why you were in the hospital? yes   Do you understand the discharge instructions? yes   Where were you discharged to? Home   Items Reviewed:  Medications reviewed: yes  Allergies reviewed: yes  Dietary changes reviewed: yes  Referrals reviewed: yes   Functional Questionnaire:   Activities of Daily Living (ADLs):   She states they are independent in the following: feeding, continence, grooming and toileting States they require assistance with the following: ambulation, bathing and hygiene and dressing (patient using a walker with ambulation).  Needs home health for assistance with bathing, etc.   Any transportation issues/concerns?: no   Any patient concerns? no   Confirmed importance and date/time of follow-up visits scheduled yes  Provider Appointment booked with Dr. Jonni Sanger on 08/22/2018 @ 8:20 am.  Confirmed with patient if condition begins to worsen call PCP or go to the ER.  Patient was given the office number and encouraged to call back with question or concerns.  : yes

## 2018-08-21 NOTE — Telephone Encounter (Signed)
Called Will and gave verbal order for requested

## 2018-08-21 NOTE — Telephone Encounter (Signed)

## 2018-08-22 ENCOUNTER — Other Ambulatory Visit: Payer: Self-pay

## 2018-08-22 ENCOUNTER — Ambulatory Visit (INDEPENDENT_AMBULATORY_CARE_PROVIDER_SITE_OTHER): Payer: Medicare Other | Admitting: Pharmacist

## 2018-08-22 ENCOUNTER — Encounter: Payer: Self-pay | Admitting: Family Medicine

## 2018-08-22 ENCOUNTER — Telehealth: Payer: Self-pay

## 2018-08-22 ENCOUNTER — Encounter: Payer: Self-pay | Admitting: Pharmacist

## 2018-08-22 ENCOUNTER — Ambulatory Visit (INDEPENDENT_AMBULATORY_CARE_PROVIDER_SITE_OTHER): Payer: Medicare Other | Admitting: Family Medicine

## 2018-08-22 VITALS — BP 126/72 | HR 84 | Temp 98.1°F | Resp 16 | Ht 62.0 in | Wt 130.0 lb

## 2018-08-22 DIAGNOSIS — D682 Hereditary deficiency of other clotting factors: Secondary | ICD-10-CM | POA: Diagnosis not present

## 2018-08-22 DIAGNOSIS — S32009D Unspecified fracture of unspecified lumbar vertebra, subsequent encounter for fracture with routine healing: Secondary | ICD-10-CM

## 2018-08-22 DIAGNOSIS — Z7901 Long term (current) use of anticoagulants: Secondary | ICD-10-CM | POA: Diagnosis not present

## 2018-08-22 DIAGNOSIS — S2241XD Multiple fractures of ribs, right side, subsequent encounter for fracture with routine healing: Secondary | ICD-10-CM

## 2018-08-22 DIAGNOSIS — D6851 Activated protein C resistance: Secondary | ICD-10-CM | POA: Diagnosis not present

## 2018-08-22 DIAGNOSIS — Z5181 Encounter for therapeutic drug level monitoring: Secondary | ICD-10-CM

## 2018-08-22 DIAGNOSIS — Z09 Encounter for follow-up examination after completed treatment for conditions other than malignant neoplasm: Secondary | ICD-10-CM

## 2018-08-22 DIAGNOSIS — M810 Age-related osteoporosis without current pathological fracture: Secondary | ICD-10-CM | POA: Diagnosis not present

## 2018-08-22 LAB — POCT INR: INR: 2.1 (ref 2.0–3.0)

## 2018-08-22 MED ORDER — DENOSUMAB 60 MG/ML ~~LOC~~ SOSY
60.0000 mg | PREFILLED_SYRINGE | Freq: Once | SUBCUTANEOUS | Status: AC
  Administered 2018-08-22: 60 mg via SUBCUTANEOUS

## 2018-08-22 NOTE — Telephone Encounter (Signed)
This encounter was created in error - please disregard.

## 2018-08-22 NOTE — Telephone Encounter (Signed)
Copied from [place] [phone]. Topic: General - Other >> Aug 22, 2018  4:43 PM Robin Harrell, [place] C wrote: Reason for CRM: pt was seen by PCP and says that she forgot to ask her a few questions. Pt would like to know if assistant can give her a call back?

## 2018-08-22 NOTE — Patient Instructions (Signed)
Please return in December for your physical with blood work and check up.   Call me if you need me.   If you have any questions or concerns, please don't hesitate to send me a message via MyChart or call the office at [phone]. Thank you for visiting with [place] today! It's our pleasure caring for you.

## 2018-08-22 NOTE — Progress Notes (Signed)
Robin Harrell is a 85 y.o. female presents to the office today for Prolia. Prolia (denosumab) injection, per physician's orders. Original order:Prolia (denosumab) (med), 60mg /mL (dose),  SubQ (route) was administered left arm (location) today. Patient tolerated injection well. Patient next injection due: [name] 2021  Layla Barter

## 2018-08-22 NOTE — Progress Notes (Signed)
Subjective  CC:  Chief Complaint  Patient presents with   Hospitalization Follow-up    Wsa in the hospital for 8 day, closed fracture of multiple ribs of right side.     HPI: Robin Harrell is a 85 y.o. female who presents to the office today to address the problems listed above in the chief complaint.  85 year old here for hospital follow-up after a fall, date of injury 08/10/2018, she sustained multiple rib fractures and a transverse process fracture of the spine.  She was hospitalized in rehab for over a week.  She was on long-term Coumadin, CT scans did not show internal injuries.  She is doing remarkably well. Pain is pretty well controlled with tylenol. Has ot/pt at home. No sob. No longer with hematuria. No f/c/s or sob. Using incentive spirometry. HH was ordered yesterday to help with bathing and dressing. Using a walker to help with ambulation.   Seeing coumadin clinic this afternoon for coumadin dose adjustments.   Osteoporosis on prolia: due now for injection,  Assessment  1. Hospital discharge follow-up   2. Closed fracture of multiple ribs of right side with routine healing, subsequent encounter   3. Long term (current) use of anticoagulants   4. Age-related osteoporosis without current pathological fracture   5. Factor V deficiency ([place])   6. Closed fracture of transverse process of lumbar vertebra with routine healing, subsequent encounter      Plan   hopsital f/u:  Doing very well. Counseling and education given on expectations over next several weeks. No changes made to meds today. Continue pt/ot and HH. Discussed fall prevention.  prolia injection given today. Repeat in 6 months.   Follow up: December for CPE, sooner if needed.   Visit date not found  No orders of the defined types were placed in this encounter.  No orders of the defined types were placed in this encounter.     I reviewed the patients updated PMH, FH, and SocHx.    Patient Active  Problem List   Diagnosis Date Noted   Hypothyroidism (acquired) 09/02/2014    Priority: High   Hyperlipidemia 11/19/2012    Priority: High   Long term (current) use of anticoagulants 05/10/2012    Priority: High   Diastolic dysfunction 78/29/5621    Priority: High   Factor V deficiency ([place]) 02/09/2010    Priority: High   Osteoporosis 02/09/2010    Priority: High   Essential hypertension 11/15/2007    Priority: High   Aortic stenosis, mild 02/07/2017    Priority: Medium   Chronic idiopathic thrombocytopenia ([place]) 04/14/2016    Priority: Medium   Essential tremor 09/03/2015    Priority: Medium   Edema of right lower extremity 02/01/2013    Priority: Medium   Colon polyp 10/15/2012    Priority: Medium   Insomnia 10/11/2011    Priority: Medium   Osteoarthritis, multiple sites 06/30/2011    Priority: Medium   DJD (degenerative joint disease), lumbar 10/14/2009    Priority: Medium   Glaucoma 01/29/2008    Priority: Medium   History of DVT (deep vein thrombosis) 11/15/2007    Priority: Medium   GERD 11/15/2007    Priority: Medium   COUGH, CHRONIC 11/15/2007    Priority: Medium   History of recurrent UTIs 01/26/2017    Priority: Low   History of respiratory failure 11/11/2014    Priority: Low   Seasonal and perennial allergic rhinitis 11/15/2007    Priority: Low   Hematuria 08/14/2018  Multiple rib fractures, right 10-12 08/10/2018   Gross hematuria 08/10/2018   Traumatic hematoma of flank 08/10/2018   Fall against sharp object 08/10/2018   Anticoagulated on warfarin    Chronic diastolic CHF (congestive heart failure) (HCC)    Current Meds  Medication Sig   acetaminophen (TYLENOL) 500 MG tablet Take 1,000 mg by mouth every 6 (six) hours as needed for moderate pain.   atorvastatin (LIPITOR) 10 MG tablet Take 1 tablet (10 mg total) by mouth daily at 6 PM.   brimonidine (ALPHAGAN) 0.15 % ophthalmic solution Place 1 drop into both eyes 2  (two) times daily.   Calcium-Phosphorus-Vitamin D (CALCIUM/D3 ADULT GUMMIES) 200-96.6-200 MG-MG-UNIT CHEW Chew 2 tablets by mouth daily.   cetirizine (ZYRTEC) 10 MG tablet TAKE 1 TABLET BY MOUTH EVERY DAY   chlorpheniramine (CHLOR-TRIMETON) 4 MG tablet Take 4 mg by mouth every morning.    ipratropium (ATROVENT) 0.03 % nasal spray Place 2 sprays into both nostrils 3 (three) times daily as needed for rhinitis.   latanoprost (XALATAN) 0.005 % ophthalmic solution Place 1 drop into both eyes at bedtime.    levothyroxine (SYNTHROID, LEVOTHROID) 25 MCG tablet TAKE 1 TABLET BY MOUTH EVERY DAY (Patient taking differently: Take 25 mcg by mouth daily before breakfast. )   losartan (COZAAR) 50 MG tablet TAKE 1 TABLET BY MOUTH EVERY DAY (Patient taking differently: Take 50 mg by mouth daily. )   metoprolol tartrate (LOPRESSOR) 25 MG tablet TAKE 1 TABLET (25 MG TOTAL) BY MOUTH 2 (TWO) TIMES DAILY. KEEP OV. (Patient taking differently: Take 25 mg by mouth 2 (two) times daily. )   multivitamin-iron-minerals-folic acid (CENTRUM) chewable tablet Chew 1 tablet by mouth daily.   vitamin E (VITAMIN E) 1000 UNIT capsule Take 1,000 Units by mouth daily.   warfarin (COUMADIN) 7.5 MG tablet Take 1/2 to 1 tablet by mouth daily as directed (Patient taking differently: Take 3.75-7.5 mg by mouth See admin instructions. 7.5mg  Sunday, Tuesday, Thursday and Saturday. 3.75 mg Monday, Wednesday and Friday)    Allergies: Patient is allergic to codeine and erythromycin. Family History: Patient family history includes Allergic rhinitis in her sister; Breast cancer (age of onset: 65) in her daughter; Cirrhosis in her father; Coronary artery disease in her maternal grandfather and paternal grandfather; Heart disease in her paternal grandmother; Lung cancer in her father; Skin cancer in her brother; Throat cancer in her maternal grandfather and paternal grandfather. Social History:  Patient  reports that she quit smoking  about 60 years ago. Her smoking use included cigarettes. She quit after 1.00 year of use. She has never used smokeless tobacco. She reports current alcohol use. She reports that she does not use drugs.  Review of Systems: Constitutional: Negative for fever malaise or anorexia Cardiovascular: negative for chest pain Respiratory: negative for SOB or persistent cough Gastrointestinal: negative for abdominal pain  Objective  Vitals: Resp 16    Ht 5\' 2"  (1.575 m)    Wt 130 lb (59 kg)    LMP  (LMP Unknown)    BMI 23.78 kg/m  General: no acute distress , A&Ox3, breathing comfortably HEENT: PEERL, conjunctiva normal, Oropharynx moist,neck is supple Cardiovascular:  RRR.  Respiratory:  Good breath sounds bilaterally, CTAB with normal respiratory effort, right chest wall with contusions/ecchymosis Gastrointestinal: soft, flat abdomen, normal active bowel sounds, no palpable masses, no hepatosplenomegaly, no appreciated hernias, no flank ttp Skin:  Warm, no rashes     Commons side effects, risks, benefits, and alternatives for medications and treatment  plan prescribed today were discussed, and the patient expressed understanding of the given instructions. Patient is instructed to call or message via MyChart if he/she has any questions or concerns regarding our treatment plan. No barriers to understanding were identified. We discussed Red Flag symptoms and signs in detail. Patient expressed understanding regarding what to do in case of urgent or emergency type symptoms.   Medication list was reconciled, printed and provided to the patient in AVS. Patient instructions and summary information was reviewed with the patient as documented in the AVS. This note was prepared with assistance of Dragon voice recognition software. Occasional wrong-word or sound-a-like substitutions may have occurred due to the inherent limitations of voice recognition software

## 2018-08-23 NOTE — Telephone Encounter (Signed)
Called pt she wanted to verify if she needed to continue getting mammograms (no per Dr. Jonni Sanger) and if she needs to continue Vitamin E while on Coumadin (no per Dr. Jonni Sanger). She is aware of Dr. Tamela Oddi recommendations and verbalized understanding.

## 2018-08-27 DIAGNOSIS — D682 Hereditary deficiency of other clotting factors: Secondary | ICD-10-CM | POA: Diagnosis not present

## 2018-08-27 DIAGNOSIS — S37011D Minor contusion of right kidney, subsequent encounter: Secondary | ICD-10-CM | POA: Diagnosis not present

## 2018-08-27 DIAGNOSIS — R319 Hematuria, unspecified: Secondary | ICD-10-CM | POA: Diagnosis not present

## 2018-08-27 DIAGNOSIS — I110 Hypertensive heart disease with heart failure: Secondary | ICD-10-CM | POA: Diagnosis not present

## 2018-08-27 DIAGNOSIS — W19XXXD Unspecified fall, subsequent encounter: Secondary | ICD-10-CM | POA: Diagnosis not present

## 2018-08-27 DIAGNOSIS — S2241XD Multiple fractures of ribs, right side, subsequent encounter for fracture with routine healing: Secondary | ICD-10-CM | POA: Diagnosis not present

## 2018-08-28 DIAGNOSIS — S2241XD Multiple fractures of ribs, right side, subsequent encounter for fracture with routine healing: Secondary | ICD-10-CM | POA: Diagnosis not present

## 2018-08-28 DIAGNOSIS — R319 Hematuria, unspecified: Secondary | ICD-10-CM | POA: Diagnosis not present

## 2018-08-28 DIAGNOSIS — W19XXXD Unspecified fall, subsequent encounter: Secondary | ICD-10-CM | POA: Diagnosis not present

## 2018-08-28 DIAGNOSIS — D682 Hereditary deficiency of other clotting factors: Secondary | ICD-10-CM | POA: Diagnosis not present

## 2018-08-28 DIAGNOSIS — S37011D Minor contusion of right kidney, subsequent encounter: Secondary | ICD-10-CM | POA: Diagnosis not present

## 2018-08-28 DIAGNOSIS — I110 Hypertensive heart disease with heart failure: Secondary | ICD-10-CM | POA: Diagnosis not present

## 2018-08-30 DIAGNOSIS — I110 Hypertensive heart disease with heart failure: Secondary | ICD-10-CM | POA: Diagnosis not present

## 2018-08-30 DIAGNOSIS — S2241XD Multiple fractures of ribs, right side, subsequent encounter for fracture with routine healing: Secondary | ICD-10-CM | POA: Diagnosis not present

## 2018-08-30 DIAGNOSIS — D682 Hereditary deficiency of other clotting factors: Secondary | ICD-10-CM | POA: Diagnosis not present

## 2018-08-30 DIAGNOSIS — W19XXXD Unspecified fall, subsequent encounter: Secondary | ICD-10-CM | POA: Diagnosis not present

## 2018-08-30 DIAGNOSIS — R319 Hematuria, unspecified: Secondary | ICD-10-CM | POA: Diagnosis not present

## 2018-08-30 DIAGNOSIS — S37011D Minor contusion of right kidney, subsequent encounter: Secondary | ICD-10-CM | POA: Diagnosis not present

## 2018-08-31 ENCOUNTER — Encounter: Payer: Self-pay | Admitting: Podiatry

## 2018-08-31 ENCOUNTER — Ambulatory Visit (INDEPENDENT_AMBULATORY_CARE_PROVIDER_SITE_OTHER): Payer: Medicare Other | Admitting: Podiatry

## 2018-08-31 ENCOUNTER — Other Ambulatory Visit: Payer: Self-pay

## 2018-08-31 DIAGNOSIS — D682 Hereditary deficiency of other clotting factors: Secondary | ICD-10-CM | POA: Diagnosis not present

## 2018-08-31 DIAGNOSIS — M79676 Pain in unspecified toe(s): Secondary | ICD-10-CM

## 2018-08-31 DIAGNOSIS — W19XXXD Unspecified fall, subsequent encounter: Secondary | ICD-10-CM | POA: Diagnosis not present

## 2018-08-31 DIAGNOSIS — D6851 Activated protein C resistance: Secondary | ICD-10-CM

## 2018-08-31 DIAGNOSIS — I110 Hypertensive heart disease with heart failure: Secondary | ICD-10-CM | POA: Diagnosis not present

## 2018-08-31 DIAGNOSIS — R319 Hematuria, unspecified: Secondary | ICD-10-CM | POA: Diagnosis not present

## 2018-08-31 DIAGNOSIS — S37011D Minor contusion of right kidney, subsequent encounter: Secondary | ICD-10-CM | POA: Diagnosis not present

## 2018-08-31 DIAGNOSIS — L84 Corns and callosities: Secondary | ICD-10-CM | POA: Diagnosis not present

## 2018-08-31 DIAGNOSIS — S2241XD Multiple fractures of ribs, right side, subsequent encounter for fracture with routine healing: Secondary | ICD-10-CM | POA: Diagnosis not present

## 2018-08-31 DIAGNOSIS — B351 Tinea unguium: Secondary | ICD-10-CM

## 2018-08-31 NOTE — Patient Instructions (Signed)

## 2018-09-02 NOTE — Progress Notes (Addendum)
Subjective: Robin Harrell presents to clinic with cc of painful mycotic toenails and painful corns right foot which are aggravated when weightbearing with and without shoe gear.  This pain limits her daily activities. Pain symptoms resolve with periodic professional debridement. She states her right 3rd digit feels much better now since her tenotomy was performed by Dr. Jacqualyn Posey.  She states she has had a fall since her last visit. She sustained multiple rib fractures. Her daughters are currently seeking help to assist Robin Harrell and her husband in the home. Robin Harrell is also looking at [employer] as an option.  She has new complaint of pain in feet. Pain is described as sharp and shooting. Mostly occurs in the evening time.  Robin Arnt, MD is her PCP.    Allergies  Allergen Reactions  . Codeine Nausea Only  . Erythromycin Nausea And Vomiting    Objective:  Physical Examination:  Vascular  Examination: Capillary refill time <3 seconds  x 10 digits.  Palpable DP/PT pulses b/l.  Digital hair absent b/l.  No edema noted b/l.  Skin temperature gradient WNL b/l.  Dermatological Examination: Skin with normal turgor, texture and tone b/l.  No open wounds b/l.  No interdigital macerations noted b/l.  Elongated, thick, discolored brittle toenails with subungual debris and pain on dorsal palpation of nailbeds 1-5 b/l.  Hyperkeratotic lesion distal tip b/l 3rd digits and right 2nd digit. No erythema, no edema, no drainage, no flocculence noted.  Musculoskeletal Examination: Muscle strength 5/5 to all muscle groups b/l.  Hammertoes b/l 4th digits.  No pain, crepitus or joint discomfort with active/passive ROM.  Neurological Examination: Sensation intact 5/5 b/l with 10 gram monofilament.  Vibratory sensation intact b/l.  Proprioceptive sensation intact b/l.  Assessment: 1. Mycotic nail infection with pain 1-5 b/l 2. Corns b/l 3rd digits, right 2nd  digit 3. Factor V Leiden (coagulopathy)  Plan: 1. Toenails 1-5 b/l were debrided in length and girth without iatrogenic laceration. 2. Corn(s) pared b/l 3rd digit, right 2nd digit utilizing sterile scalpel blade without incident. 3. For her foot pain, a prescription was sent to [facility] for peripheral neuropathy cream which consists of: Bupivacaine 1%, doxepin 3%, gabapentin 6%, pentoxifylline 3%, and Topimarate 1%. Apply 1-2 grams to feet 3-4 times daily as needed for foot pain. 4. Continue soft, supportive shoe gear daily. 5. Report any pedal injuries to medical professional. 6. Since she is unsure of her living situation at present, she would like to reschedule to follow up in 3 months. 7. Patient/POA to call should there be a question/concern in there interim.

## 2018-09-03 DIAGNOSIS — S37011D Minor contusion of right kidney, subsequent encounter: Secondary | ICD-10-CM | POA: Diagnosis not present

## 2018-09-03 DIAGNOSIS — S2241XD Multiple fractures of ribs, right side, subsequent encounter for fracture with routine healing: Secondary | ICD-10-CM | POA: Diagnosis not present

## 2018-09-03 DIAGNOSIS — M810 Age-related osteoporosis without current pathological fracture: Secondary | ICD-10-CM

## 2018-09-03 DIAGNOSIS — R319 Hematuria, unspecified: Secondary | ICD-10-CM | POA: Diagnosis not present

## 2018-09-03 DIAGNOSIS — Z7901 Long term (current) use of anticoagulants: Secondary | ICD-10-CM

## 2018-09-03 DIAGNOSIS — I110 Hypertensive heart disease with heart failure: Secondary | ICD-10-CM | POA: Diagnosis not present

## 2018-09-03 DIAGNOSIS — W19XXXD Unspecified fall, subsequent encounter: Secondary | ICD-10-CM | POA: Diagnosis not present

## 2018-09-03 DIAGNOSIS — D682 Hereditary deficiency of other clotting factors: Secondary | ICD-10-CM | POA: Diagnosis not present

## 2018-09-03 DIAGNOSIS — Z86718 Personal history of other venous thrombosis and embolism: Secondary | ICD-10-CM | POA: Diagnosis not present

## 2018-09-03 DIAGNOSIS — I5032 Chronic diastolic (congestive) heart failure: Secondary | ICD-10-CM

## 2018-09-03 DIAGNOSIS — Z5181 Encounter for therapeutic drug level monitoring: Secondary | ICD-10-CM

## 2018-09-05 DIAGNOSIS — S2241XD Multiple fractures of ribs, right side, subsequent encounter for fracture with routine healing: Secondary | ICD-10-CM | POA: Diagnosis not present

## 2018-09-05 DIAGNOSIS — S37011D Minor contusion of right kidney, subsequent encounter: Secondary | ICD-10-CM | POA: Diagnosis not present

## 2018-09-05 DIAGNOSIS — W19XXXD Unspecified fall, subsequent encounter: Secondary | ICD-10-CM | POA: Diagnosis not present

## 2018-09-05 DIAGNOSIS — I110 Hypertensive heart disease with heart failure: Secondary | ICD-10-CM | POA: Diagnosis not present

## 2018-09-05 DIAGNOSIS — D682 Hereditary deficiency of other clotting factors: Secondary | ICD-10-CM | POA: Diagnosis not present

## 2018-09-05 DIAGNOSIS — R319 Hematuria, unspecified: Secondary | ICD-10-CM | POA: Diagnosis not present

## 2018-09-06 DIAGNOSIS — S37011D Minor contusion of right kidney, subsequent encounter: Secondary | ICD-10-CM | POA: Diagnosis not present

## 2018-09-06 DIAGNOSIS — I110 Hypertensive heart disease with heart failure: Secondary | ICD-10-CM | POA: Diagnosis not present

## 2018-09-06 DIAGNOSIS — W19XXXD Unspecified fall, subsequent encounter: Secondary | ICD-10-CM | POA: Diagnosis not present

## 2018-09-06 DIAGNOSIS — D682 Hereditary deficiency of other clotting factors: Secondary | ICD-10-CM | POA: Diagnosis not present

## 2018-09-06 DIAGNOSIS — R319 Hematuria, unspecified: Secondary | ICD-10-CM | POA: Diagnosis not present

## 2018-09-06 DIAGNOSIS — S2241XD Multiple fractures of ribs, right side, subsequent encounter for fracture with routine healing: Secondary | ICD-10-CM | POA: Diagnosis not present

## 2018-09-07 ENCOUNTER — Encounter: Payer: Self-pay | Admitting: *Deleted

## 2018-09-11 ENCOUNTER — Other Ambulatory Visit: Payer: Self-pay | Admitting: Emergency Medicine

## 2018-09-13 DIAGNOSIS — D682 Hereditary deficiency of other clotting factors: Secondary | ICD-10-CM | POA: Diagnosis not present

## 2018-09-13 DIAGNOSIS — I110 Hypertensive heart disease with heart failure: Secondary | ICD-10-CM | POA: Diagnosis not present

## 2018-09-13 DIAGNOSIS — S2241XD Multiple fractures of ribs, right side, subsequent encounter for fracture with routine healing: Secondary | ICD-10-CM | POA: Diagnosis not present

## 2018-09-13 DIAGNOSIS — S37011D Minor contusion of right kidney, subsequent encounter: Secondary | ICD-10-CM | POA: Diagnosis not present

## 2018-09-13 DIAGNOSIS — W19XXXD Unspecified fall, subsequent encounter: Secondary | ICD-10-CM | POA: Diagnosis not present

## 2018-09-13 DIAGNOSIS — R319 Hematuria, unspecified: Secondary | ICD-10-CM | POA: Diagnosis not present

## 2018-09-18 DIAGNOSIS — I5032 Chronic diastolic (congestive) heart failure: Secondary | ICD-10-CM | POA: Diagnosis not present

## 2018-09-18 DIAGNOSIS — W19XXXD Unspecified fall, subsequent encounter: Secondary | ICD-10-CM | POA: Diagnosis not present

## 2018-09-18 DIAGNOSIS — M810 Age-related osteoporosis without current pathological fracture: Secondary | ICD-10-CM | POA: Diagnosis not present

## 2018-09-18 DIAGNOSIS — Z5181 Encounter for therapeutic drug level monitoring: Secondary | ICD-10-CM | POA: Diagnosis not present

## 2018-09-18 DIAGNOSIS — D682 Hereditary deficiency of other clotting factors: Secondary | ICD-10-CM | POA: Diagnosis not present

## 2018-09-18 DIAGNOSIS — Z86718 Personal history of other venous thrombosis and embolism: Secondary | ICD-10-CM | POA: Diagnosis not present

## 2018-09-18 DIAGNOSIS — S2241XD Multiple fractures of ribs, right side, subsequent encounter for fracture with routine healing: Secondary | ICD-10-CM | POA: Diagnosis not present

## 2018-09-18 DIAGNOSIS — S37011D Minor contusion of right kidney, subsequent encounter: Secondary | ICD-10-CM | POA: Diagnosis not present

## 2018-09-18 DIAGNOSIS — R319 Hematuria, unspecified: Secondary | ICD-10-CM | POA: Diagnosis not present

## 2018-09-18 DIAGNOSIS — I110 Hypertensive heart disease with heart failure: Secondary | ICD-10-CM | POA: Diagnosis not present

## 2018-09-18 DIAGNOSIS — Z7901 Long term (current) use of anticoagulants: Secondary | ICD-10-CM | POA: Diagnosis not present

## 2018-09-19 ENCOUNTER — Ambulatory Visit (INDEPENDENT_AMBULATORY_CARE_PROVIDER_SITE_OTHER): Payer: Medicare Other | Admitting: Pharmacist

## 2018-09-19 ENCOUNTER — Other Ambulatory Visit: Payer: Self-pay

## 2018-09-19 DIAGNOSIS — Z5181 Encounter for therapeutic drug level monitoring: Secondary | ICD-10-CM

## 2018-09-19 DIAGNOSIS — Z7901 Long term (current) use of anticoagulants: Secondary | ICD-10-CM

## 2018-09-19 DIAGNOSIS — D6851 Activated protein C resistance: Secondary | ICD-10-CM

## 2018-09-19 LAB — POCT INR: INR: 2.7 (ref 2.0–3.0)

## 2018-09-20 ENCOUNTER — Other Ambulatory Visit: Payer: Self-pay | Admitting: Cardiovascular Disease

## 2018-09-20 ENCOUNTER — Other Ambulatory Visit: Payer: Self-pay | Admitting: Family Medicine

## 2018-09-25 ENCOUNTER — Telehealth: Payer: Self-pay | Admitting: Family Medicine

## 2018-09-25 DIAGNOSIS — W19XXXD Unspecified fall, subsequent encounter: Secondary | ICD-10-CM | POA: Diagnosis not present

## 2018-09-25 DIAGNOSIS — D682 Hereditary deficiency of other clotting factors: Secondary | ICD-10-CM | POA: Diagnosis not present

## 2018-09-25 DIAGNOSIS — S37011D Minor contusion of right kidney, subsequent encounter: Secondary | ICD-10-CM | POA: Diagnosis not present

## 2018-09-25 DIAGNOSIS — I110 Hypertensive heart disease with heart failure: Secondary | ICD-10-CM | POA: Diagnosis not present

## 2018-09-25 DIAGNOSIS — R319 Hematuria, unspecified: Secondary | ICD-10-CM | POA: Diagnosis not present

## 2018-09-25 DIAGNOSIS — S2241XD Multiple fractures of ribs, right side, subsequent encounter for fracture with routine healing: Secondary | ICD-10-CM | POA: Diagnosis not present

## 2018-09-25 NOTE — Telephone Encounter (Signed)
LMOVM with verbal orders. 

## 2018-09-25 NOTE — Telephone Encounter (Signed)
Caller name: Gwinda Passe Relation to pt: PT from [facility] Call back number: [phone]    Reason for call:  Requesting verbal orders for extend PT 2x 2 1x 1

## 2018-09-25 NOTE — Telephone Encounter (Signed)
See note

## 2018-09-26 ENCOUNTER — Other Ambulatory Visit: Payer: Self-pay | Admitting: Cardiovascular Disease

## 2018-09-26 NOTE — Telephone Encounter (Signed)
Please review for refill.  

## 2018-10-01 DIAGNOSIS — R319 Hematuria, unspecified: Secondary | ICD-10-CM | POA: Diagnosis not present

## 2018-10-01 DIAGNOSIS — S37011D Minor contusion of right kidney, subsequent encounter: Secondary | ICD-10-CM | POA: Diagnosis not present

## 2018-10-01 DIAGNOSIS — S2241XD Multiple fractures of ribs, right side, subsequent encounter for fracture with routine healing: Secondary | ICD-10-CM | POA: Diagnosis not present

## 2018-10-01 DIAGNOSIS — I110 Hypertensive heart disease with heart failure: Secondary | ICD-10-CM | POA: Diagnosis not present

## 2018-10-01 DIAGNOSIS — D682 Hereditary deficiency of other clotting factors: Secondary | ICD-10-CM | POA: Diagnosis not present

## 2018-10-01 DIAGNOSIS — W19XXXD Unspecified fall, subsequent encounter: Secondary | ICD-10-CM | POA: Diagnosis not present

## 2018-10-03 DIAGNOSIS — D682 Hereditary deficiency of other clotting factors: Secondary | ICD-10-CM | POA: Diagnosis not present

## 2018-10-03 DIAGNOSIS — W19XXXD Unspecified fall, subsequent encounter: Secondary | ICD-10-CM | POA: Diagnosis not present

## 2018-10-03 DIAGNOSIS — S37011D Minor contusion of right kidney, subsequent encounter: Secondary | ICD-10-CM | POA: Diagnosis not present

## 2018-10-03 DIAGNOSIS — S2241XD Multiple fractures of ribs, right side, subsequent encounter for fracture with routine healing: Secondary | ICD-10-CM | POA: Diagnosis not present

## 2018-10-03 DIAGNOSIS — I110 Hypertensive heart disease with heart failure: Secondary | ICD-10-CM | POA: Diagnosis not present

## 2018-10-03 DIAGNOSIS — R319 Hematuria, unspecified: Secondary | ICD-10-CM | POA: Diagnosis not present

## 2018-10-11 DIAGNOSIS — S37011D Minor contusion of right kidney, subsequent encounter: Secondary | ICD-10-CM | POA: Diagnosis not present

## 2018-10-11 DIAGNOSIS — S2241XD Multiple fractures of ribs, right side, subsequent encounter for fracture with routine healing: Secondary | ICD-10-CM | POA: Diagnosis not present

## 2018-10-11 DIAGNOSIS — D682 Hereditary deficiency of other clotting factors: Secondary | ICD-10-CM | POA: Diagnosis not present

## 2018-10-11 DIAGNOSIS — I110 Hypertensive heart disease with heart failure: Secondary | ICD-10-CM | POA: Diagnosis not present

## 2018-10-11 DIAGNOSIS — R319 Hematuria, unspecified: Secondary | ICD-10-CM | POA: Diagnosis not present

## 2018-10-11 DIAGNOSIS — W19XXXD Unspecified fall, subsequent encounter: Secondary | ICD-10-CM | POA: Diagnosis not present

## 2018-10-16 ENCOUNTER — Ambulatory Visit (INDEPENDENT_AMBULATORY_CARE_PROVIDER_SITE_OTHER): Payer: Medicare Other | Admitting: Pharmacist

## 2018-10-16 ENCOUNTER — Other Ambulatory Visit: Payer: Self-pay

## 2018-10-16 DIAGNOSIS — D682 Hereditary deficiency of other clotting factors: Secondary | ICD-10-CM | POA: Diagnosis not present

## 2018-10-16 DIAGNOSIS — D6851 Activated protein C resistance: Secondary | ICD-10-CM

## 2018-10-16 DIAGNOSIS — Z5181 Encounter for therapeutic drug level monitoring: Secondary | ICD-10-CM

## 2018-10-16 DIAGNOSIS — S37011D Minor contusion of right kidney, subsequent encounter: Secondary | ICD-10-CM | POA: Diagnosis not present

## 2018-10-16 DIAGNOSIS — I110 Hypertensive heart disease with heart failure: Secondary | ICD-10-CM | POA: Diagnosis not present

## 2018-10-16 DIAGNOSIS — Z7901 Long term (current) use of anticoagulants: Secondary | ICD-10-CM

## 2018-10-16 DIAGNOSIS — R319 Hematuria, unspecified: Secondary | ICD-10-CM | POA: Diagnosis not present

## 2018-10-16 DIAGNOSIS — W19XXXD Unspecified fall, subsequent encounter: Secondary | ICD-10-CM | POA: Diagnosis not present

## 2018-10-16 DIAGNOSIS — S2241XD Multiple fractures of ribs, right side, subsequent encounter for fracture with routine healing: Secondary | ICD-10-CM | POA: Diagnosis not present

## 2018-10-16 LAB — POCT INR: INR: 4 — AB (ref 2.0–3.0)

## 2018-10-17 ENCOUNTER — Ambulatory Visit (INDEPENDENT_AMBULATORY_CARE_PROVIDER_SITE_OTHER): Payer: Medicare Other | Admitting: Family Medicine

## 2018-10-17 ENCOUNTER — Encounter: Payer: Self-pay | Admitting: Family Medicine

## 2018-10-17 VITALS — BP 106/60 | HR 78 | Temp 98.7°F | Resp 16 | Ht 62.0 in | Wt 130.0 lb

## 2018-10-17 DIAGNOSIS — Z23 Encounter for immunization: Secondary | ICD-10-CM | POA: Diagnosis not present

## 2018-10-17 DIAGNOSIS — R399 Unspecified symptoms and signs involving the genitourinary system: Secondary | ICD-10-CM | POA: Diagnosis not present

## 2018-10-17 DIAGNOSIS — N3000 Acute cystitis without hematuria: Secondary | ICD-10-CM | POA: Diagnosis not present

## 2018-10-17 LAB — POCT URINALYSIS DIPSTICK
Bilirubin, UA: NEGATIVE
Blood, UA: NEGATIVE
Glucose, UA: NEGATIVE
Ketones, UA: NEGATIVE
Nitrite, UA: POSITIVE
Protein, UA: NEGATIVE
Spec Grav, UA: 1.025 (ref 1.010–1.025)
Urobilinogen, UA: 0.2 E.U./dL
pH, UA: 5.5 (ref 5.0–8.0)

## 2018-10-17 MED ORDER — CEPHALEXIN 500 MG PO CAPS: 500.0000 mg | ORAL_CAPSULE | Freq: Two times a day (BID) | ORAL | 0 refills | Status: DC

## 2018-10-17 NOTE — Progress Notes (Signed)
Subjective   CC:  Chief Complaint  Patient presents with  . Urinary Tract Infection    Started over the weekend. Reports frequency & urgency.. Denies abdominal pain, dysuria, or nausea/vomiting    HPI: Robin Harrell is a 85 y.o. female who presents to the office today to address the problems listed above in the chief complaint.  Patient reports dysuria and urinary frequency.  She has sensation of increased urinary pressure.  She denies fevers flank pain nausea vomiting or gross hematuria.  Symptoms have been present for several days.  She denies history of interstitial cystitis.  She denies vaginal symptoms including vaginal discharge or pelvic pain. No malaise. shes on coumadin  Assessment  1. Acute cystitis without hematuria   2. UTI symptoms      Plan   UTI; start keflex. Has h/o sensitive e.coli infection in the past. Discussed possible interactions with coumadin; pt will monitor. Await culture.   Flu shot today  Follow up: Return for as scheduled.  Orders Placed This Encounter  Procedures  . Urine Culture  . POCT urinalysis dipstick   Meds ordered this encounter  Medications  . cephALEXin (KEFLEX) 500 MG capsule    Sig: Take 1 capsule (500 mg total) by mouth 2 (two) times daily.    Dispense:  14 capsule    Refill:  0      I reviewed the patients updated PMH, FH, and SocHx.    Patient Active Problem List   Diagnosis Date Noted  . Hypothyroidism (acquired) 09/02/2014    Priority: High  . Hyperlipidemia 11/19/2012    Priority: High  . Long term (current) use of anticoagulants 05/10/2012    Priority: High  . Diastolic dysfunction [id number]    Priority: High  . Factor V deficiency ([place]) 02/09/2010    Priority: High  . Osteoporosis 02/09/2010    Priority: High  . Essential hypertension 11/15/2007    Priority: High  . Aortic stenosis, mild 02/07/2017    Priority: Medium  . Chronic idiopathic thrombocytopenia (HCC) 04/14/2016    Priority: Medium  .  Essential tremor 09/03/2015    Priority: Medium  . Edema of right lower extremity 02/01/2013    Priority: Medium  . Colon polyp 10/15/2012    Priority: Medium  . Insomnia 10/11/2011    Priority: Medium  . Osteoarthritis, multiple sites 06/30/2011    Priority: Medium  . DJD (degenerative joint disease), lumbar 10/14/2009    Priority: Medium  . Glaucoma 01/29/2008    Priority: Medium  . History of DVT (deep vein thrombosis) 11/15/2007    Priority: Medium  . GERD 11/15/2007    Priority: Medium  . COUGH, CHRONIC 11/15/2007    Priority: Medium  . History of recurrent UTIs 01/26/2017    Priority: Low  . History of respiratory failure 11/11/2014    Priority: Low  . Seasonal and perennial allergic rhinitis 11/15/2007    Priority: Low  . Hematuria 08/14/2018  . Multiple rib fractures, right 10-12 08/10/2018  . Gross hematuria 08/10/2018  . Traumatic hematoma of flank 08/10/2018  . Fall against sharp object 08/10/2018  . Anticoagulated on warfarin   . Chronic diastolic CHF (congestive heart failure) (HCC)    Current Meds  Medication Sig  . acetaminophen (TYLENOL) 500 MG tablet Take 1,000 mg by mouth every 6 (six) hours as needed for moderate pain.  Marland Kitchen atorvastatin (LIPITOR) 10 MG tablet TAKE 1 TABLET (10 MG TOTAL) BY MOUTH DAILY AT 6 PM.  . brimonidine (ALPHAGAN)  0.15 % ophthalmic solution Place 1 drop into both eyes 2 (two) times daily.  . Calcium-Phosphorus-Vitamin D (CALCIUM/D3 ADULT GUMMIES) 200-96.6-200 MG-MG-UNIT CHEW Chew 2 tablets by mouth daily.  . cetirizine (ZYRTEC) 10 MG tablet TAKE 1 TABLET BY MOUTH EVERY DAY  . chlorpheniramine (CHLOR-TRIMETON) 4 MG tablet Take 4 mg by mouth every morning.   Marland Kitchen ipratropium (ATROVENT) 0.03 % nasal spray PLACE 2 SPRAYS INTO BOTH NOSTRILS 3 (THREE) TIMES DAILY AS NEEDED FOR RHINITIS.  Marland Kitchen latanoprost (XALATAN) 0.005 % ophthalmic solution Place 1 drop into both eyes at bedtime.   Marland Kitchen levothyroxine (SYNTHROID, LEVOTHROID) 25 MCG tablet TAKE 1  TABLET BY MOUTH EVERY DAY (Patient taking differently: Take 25 mcg by mouth daily before breakfast. )  . losartan (COZAAR) 50 MG tablet Take 1 tablet (50 mg total) by mouth daily.  . metoprolol tartrate (LOPRESSOR) 25 MG tablet TAKE 1 TABLET (25 MG TOTAL) BY MOUTH 2 (TWO) TIMES DAILY. KEEP OV. (Patient taking differently: Take 25 mg by mouth 2 (two) times daily. )  . multivitamin-iron-minerals-folic acid (CENTRUM) chewable tablet Chew 1 tablet by mouth daily.  . NON FORMULARY Peripheral neuropathy cream from [place]  . traMADol (ULTRAM) 50 MG tablet Take 1 tablet (50 mg total) by mouth every 6 (six) hours as needed for moderate pain or severe pain.  . vitamin E (VITAMIN E) 1000 UNIT capsule Take 1,000 Units by mouth daily.  Marland Kitchen warfarin (COUMADIN) 7.5 MG tablet TAKE 1/2 TO 1 TABLET BY MOUTH DAILY AS DIRECTED    Review of Systems: Cardiovascular: negative for chest pain Respiratory: negative for SOB or persistent cough Gastrointestinal: negative for abdominal pain Constitutional: Negative for fever malaise or anorexia  Objective  Vitals: BP 106/60   Pulse 78   Temp 98.7 F (37.1 C) (Tympanic)   Resp 16   Ht 5\' 2"  (1.575 m)   Wt 130 lb (59 kg)   LMP  (LMP Unknown)   SpO2 95%   BMI 23.78 kg/m  General: no acute distress  Psych:  Alert and oriented, normal mood and affect Cardiovascular:  RRR without murmur or gallop. no peripheral edema Respiratory:  Good breath sounds bilaterally, CTAB with normal respiratory effort Gastrointestinal: soft, flat abdomen, normal active bowel sounds, no palpable masses, no hepatosplenomegaly, no appreciated hernias, NO CVAT, mild suprapubic ttp w/o rebound or guarding Skin:  Warm, no rashes Neurologic:   Mental status is normal. normal gait Office Visit on 10/17/2018  Component Date Value Ref Range Status  . Color, UA 10/17/2018 Yellow   Final  . Clarity, UA 10/17/2018 Clear   Final  . Glucose, UA 10/17/2018 Negative  Negative Final  .  Bilirubin, UA 10/17/2018 Negative   Final  . Ketones, UA 10/17/2018 Negative   Final  . Spec Grav, UA 10/17/2018 1.025  1.010 - 1.025 Final  . Blood, UA 10/17/2018 Negative   Final  . pH, UA 10/17/2018 5.5  5.0 - 8.0 Final  . Protein, UA 10/17/2018 Negative  Negative Final  . Urobilinogen, UA 10/17/2018 0.2  0.2 or 1.0 E.U./dL Final  . Nitrite, UA 10/17/2018 Positive   Final  . Leukocytes, UA 10/17/2018 Small (1+)* Negative Final    Commons side effects, risks, benefits, and alternatives for medications and treatment plan prescribed today were discussed, and the patient expressed understanding of the given instructions. Patient is instructed to call or message via MyChart if he/she has any questions or concerns regarding our treatment plan. No barriers to understanding were identified. We discussed [place] symptoms and signs in detail. Patient expressed understanding regarding what to do in case of urgent or emergency type symptoms.   Medication list was reconciled, printed and provided to the patient in AVS. Patient instructions and summary information was reviewed with the patient as documented in the AVS. This note was prepared with assistance of Dragon voice recognition software. Occasional wrong-word or sound-a-like substitutions may have occurred due to the inherent limitations of voice recognition software

## 2018-10-17 NOTE — Patient Instructions (Signed)
Please follow up as scheduled for your next visit with me: 01/29/2019   If you have any questions or concerns, please don't hesitate to send me a message via MyChart or call the office at [phone]. Thank you for visiting with [place] today! It's our pleasure caring for you.

## 2018-10-19 LAB — URINE CULTURE
MICRO NUMBER:: 814274
SPECIMEN QUALITY:: ADEQUATE

## 2018-10-19 NOTE — Progress Notes (Signed)
Please call patient: I have reviewed his/her lab results. Urine test shows infection. Should do well with keflex BUT if sxs persist, call me on Monday for different abx.thanks.

## 2018-10-25 ENCOUNTER — Other Ambulatory Visit: Payer: Self-pay | Admitting: Cardiovascular Disease

## 2018-10-30 ENCOUNTER — Ambulatory Visit (INDEPENDENT_AMBULATORY_CARE_PROVIDER_SITE_OTHER): Payer: Medicare Other | Admitting: Pharmacist

## 2018-10-30 ENCOUNTER — Other Ambulatory Visit: Payer: Self-pay

## 2018-10-30 DIAGNOSIS — D6851 Activated protein C resistance: Secondary | ICD-10-CM

## 2018-10-30 DIAGNOSIS — Z7901 Long term (current) use of anticoagulants: Secondary | ICD-10-CM | POA: Diagnosis not present

## 2018-10-30 DIAGNOSIS — Z5181 Encounter for therapeutic drug level monitoring: Secondary | ICD-10-CM

## 2018-10-30 LAB — POCT INR: INR: 2.4 (ref 2.0–3.0)

## 2018-11-05 ENCOUNTER — Telehealth: Payer: Self-pay

## 2018-11-05 NOTE — Telephone Encounter (Signed)
Copied from [place] [phone]. Topic: General - Other >> Nov 05, 2018 12:56 PM Mcneil, Ja-Kwan wrote: Reason for CRM: Pt stated she received a letter in the mail and she would like to speak with Dr. Tamela Oddi nurse to discuss. Cb# [phone]

## 2018-11-05 NOTE — Telephone Encounter (Signed)
It is up to her. We tend to keep her up to date on things here. They would act as an extra resource.

## 2018-11-05 NOTE — Telephone Encounter (Signed)
Pt aware.

## 2018-11-05 NOTE — Telephone Encounter (Signed)
Spoke with pt, she reports she got a letter asking her to get set up with the [facility] and [place], she is asking if that is something you reccommend. Please advise

## 2018-11-17 ENCOUNTER — Other Ambulatory Visit: Payer: Self-pay | Admitting: Cardiovascular Disease

## 2018-11-21 ENCOUNTER — Telehealth: Payer: Self-pay | Admitting: Family Medicine

## 2018-11-21 NOTE — Telephone Encounter (Signed)
I left a message asking the patient to call and schedule Medicare AWV with Courtney ([facility]).  If patient calls back, please schedule Medicare Wellness Visit at next available opening.  VDM (Dee-Dee) °

## 2018-11-23 ENCOUNTER — Other Ambulatory Visit: Payer: Self-pay

## 2018-11-23 ENCOUNTER — Ambulatory Visit (INDEPENDENT_AMBULATORY_CARE_PROVIDER_SITE_OTHER): Payer: Medicare Other

## 2018-11-23 DIAGNOSIS — Z0000 Encounter for general adult medical examination without abnormal findings: Secondary | ICD-10-CM

## 2018-11-23 NOTE — Progress Notes (Signed)
This visit is being conducted via phone call due to the COVID-19 pandemic. This patient has given me verbal consent via phone to conduct this visit, patient states they are participating from their home address. Some vital signs may be absent or patient reported.   Patient identification: identified by name, DOB, and current address.   Subjective:   Robin Harrell is a 85 y.o. female who presents for Medicare Annual (Subsequent) preventive examination.  Review of Systems:   Cardiac Risk Factors include: advanced age (>48men, >110 women);hypertension;dyslipidemia     Objective:     Vitals: LMP  (LMP Unknown)   There is no height or weight on file to calculate BMI.  Advanced Directives 11/23/2018 08/10/2018 11/09/2017 03/23/2016  Does Patient Have a Medical Advance Directive? Yes No Yes No  Type of Advance Directive Living will;Healthcare Power of [place];Living will -  Does patient want to make changes to medical advance directive? No - Patient declined - - -  Copy of Faith in Chart? Yes - validated most recent copy scanned in chart (See row information) - Yes -  Would patient like information on creating a medical advance directive? - No - Patient declined - No - Patient declined    Tobacco Social History   Tobacco Use  Smoking Status Former Smoker  . Years: 1.00  . Types: Cigarettes  . Quit date: 02/21/1958  . Years since quitting: 60.7  Smokeless Tobacco Never Used     Counseling given: Not Answered   Clinical Intake:  Pre-visit preparation completed: Yes  Pain : No/denies pain  Diabetes: No  How often do you need to have someone help you when you read instructions, pamphlets, or other written materials from your doctor or pharmacy?: 1 - Never  Interpreter Needed?: No  Information entered by :: Denman George LPN  Past Medical History:  Diagnosis Date  . Allergic rhinitis   . Cough   . Difficulty in  swallowing    w/ occasional aspiration  . DVT (deep venous thrombosis) ([place])   . Dyslipidemia   . Factor V Leiden deficiency    lifelong coumadin  . GERD (gastroesophageal reflux disease)   . History of nuclear stress test 12/28/2007   lexiscan; low risk   . HTN (hypertension)   . PND (post-nasal drip)   . Pulmonary embolism ([place])   . PVC's (premature ventricular contractions)   . Thyroid disease    Past Surgical History:  Procedure Laterality Date  . APPENDECTOMY    . BUNIONECTOMY    . CATARACT EXTRACTION W/ INTRAOCULAR LENS IMPLANT Right 11/02/2013  . CHOLECYSTECTOMY  1973  . ESOPHAGEAL DILATION    . HAMMER TOE SURGERY    . history of sleep study  12/27/2007   AHI during total sleep 0.9/hr and REM 1.5/hr; RDI during total sleep6.0/hr and REM 6.1/hr  . 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 TRANSTHORACIC ECHOCARDIOGRAM  12/28/2007   borderline conc LVH with normal systolic function; MV mod thickened with mild MVP & mild MR   Family History  Problem Relation Age of Onset  . Lung cancer Father   . Cirrhosis Father   . Coronary artery disease Maternal Grandfather        MI  . Throat cancer Maternal Grandfather   . Heart disease Paternal Grandmother   . Coronary artery disease Paternal Grandfather        MI  . Throat cancer Paternal  Grandfather   . Allergic rhinitis Sister   . Skin cancer Brother   . Breast cancer Daughter 61   Social History   Socioeconomic History  . Marital status: Married    Spouse name: Not on file  . Number of children: 4  . Years of education: 13.5  . Highest education level: Not on file  Occupational History  . 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 Financial resource strain: Not on file  . Food insecurity    Worry: Not on file    Inability: Not on file  . Transportation needs    Medical: Not on file    Non-medical: Not on file  Tobacco Use  . Smoking status: Former Smoker    Years: 1.00    Types: Cigarettes     Quit date: 02/21/1958    Years since quitting: 60.7  . Smokeless tobacco: Never Used  Substance and Sexual Activity  . Alcohol use: Yes    Comment: rareley one glass of wine  . Drug use: No  . Sexual activity: Never  Lifestyle  . Physical activity    Days per week: Not on file    Minutes per session: Not on file  . Stress: Not on file  Relationships  . Social Herbalist on phone: Not on file    Gets together: Not on file    Attends religious service: Not on file    Active member of club or organization: Not on file    Attends meetings of clubs or organizations: Not on file    Relationship status: Not on file  Other Topics Concern  . Not on file  Social History Narrative  . Not on file    Outpatient Encounter Medications as of 11/23/2018  Medication Sig  . acetaminophen (TYLENOL) 500 MG tablet Take 1,000 mg by mouth every 6 (six) hours as needed for moderate pain.  Marland Kitchen atorvastatin (LIPITOR) 10 MG tablet TAKE 1 TABLET (10 MG TOTAL) BY MOUTH DAILY AT 6 PM.  . brimonidine (ALPHAGAN) 0.15 % ophthalmic solution Place 1 drop into both eyes 2 (two) times daily.  . Calcium-Phosphorus-Vitamin D (CALCIUM/D3 ADULT GUMMIES) 200-96.6-200 MG-MG-UNIT CHEW Chew 2 tablets by mouth daily.  . cephALEXin (KEFLEX) 500 MG capsule Take 1 capsule (500 mg total) by mouth 2 (two) times daily.  . cetirizine (ZYRTEC) 10 MG tablet TAKE 1 TABLET BY MOUTH EVERY DAY  . chlorpheniramine (CHLOR-TRIMETON) 4 MG tablet Take 4 mg by mouth every morning.   Marland Kitchen ipratropium (ATROVENT) 0.03 % nasal spray PLACE 2 SPRAYS INTO BOTH NOSTRILS 3 (THREE) TIMES DAILY AS NEEDED FOR RHINITIS.  Marland Kitchen latanoprost (XALATAN) 0.005 % ophthalmic solution Place 1 drop into both eyes at bedtime.   Marland Kitchen levothyroxine (SYNTHROID, LEVOTHROID) 25 MCG tablet TAKE 1 TABLET BY MOUTH EVERY DAY (Patient taking differently: Take 25 mcg by mouth daily before breakfast. )  . losartan (COZAAR) 50 MG tablet Take 1 tablet (50 mg total) by mouth daily.  .  metoprolol tartrate (LOPRESSOR) 25 MG tablet TAKE 1 TABLET (25 MG TOTAL) BY MOUTH 2 (TWO) TIMES DAILY. *NEEDS OFFICE VISIT FOR FURTHER REFILLS*  . multivitamin-iron-minerals-folic acid (CENTRUM) chewable tablet Chew 1 tablet by mouth daily.  . NON FORMULARY Peripheral neuropathy cream from [place]  . traMADol (ULTRAM) 50 MG tablet Take 1 tablet (50 mg total) by mouth every 6 (six) hours as needed for moderate pain or severe pain.  . vitamin E (VITAMIN E) 1000 UNIT capsule Take  1,000 Units by mouth daily.  Marland Kitchen warfarin (COUMADIN) 7.5 MG tablet TAKE 1/2 TO 1 TABLET BY MOUTH DAILY AS DIRECTED   No facility-administered encounter medications on file as of 11/23/2018.     Activities of Daily Living In your present state of health, do you have any difficulty performing the following activities: 11/23/2018 08/13/2018  Hearing? N N  Vision? N N  Difficulty concentrating or making decisions? N N  Walking or climbing stairs? Y Y  Comment due to impaired mobility -  Dressing or bathing? N Y  Doing errands, shopping? Y N  Comment not currently driving Facilities manager and eating ? N -  Using the Toilet? N -  In the past six months, have you accidently leaked urine? N -  Do you have problems with loss of bowel control? N -  Managing your Medications? N -  Managing your Finances? N -  Comment handled by spouse/ family -  Housekeeping or managing your Housekeeping? N -  Some recent data might be hidden    Patient Care Team: [name], MD as PCP - General (Family Medicine) Lamonte Sakai Rose Fillers, MD as Consulting Physician (Pulmonary Disease) Lorretta Harp, MD as Consulting Physician (Cardiology) Harriett Sine, MD as Consulting Physician (Dermatology) Katy Apo, MD as Consulting Physician (Ophthalmology) Marzetta Board, DPM as Consulting Physician (Podiatry)    Assessment:   This is a routine wellness examination for Kemiya.  Exercise Activities and Dietary  recommendations Current Exercise Habits: The patient does not participate in regular exercise at present  Goals    . Patient Stated     Start [place] chi again, improve balance.        Fall Risk Fall Risk  11/23/2018 10/17/2018 08/02/2018 11/09/2017 01/26/2017  Falls in the past year? 1 0 0 Yes Yes  Comment - - - fell over sidewalk -  Number falls in past yr: 0 0 0 2 or more 2 or more  Injury with Fall? 1 1 0 No Yes  Risk Factor Category  - - - - High Fall Risk  Risk for fall due to : Impaired balance/gait;Impaired mobility History of fall(s);Impaired mobility;Impaired balance/gait - - Impaired balance/gait  Follow up Education provided;Falls prevention discussed;Falls evaluation completed Falls evaluation completed Falls evaluation completed Falls prevention discussed Falls prevention discussed   Is the patient's home free of loose throw rugs in walkways, pet beds, electrical cords, etc?   yes      Grab bars in the bathroom? yes      Handrails on the stairs?   yes      Adequate lighting?   yes  Depression Screen PHQ 2/9 Scores 11/23/2018 08/02/2018 11/09/2017 01/26/2017  PHQ - 2 Score 0 0 0 0     Cognitive Function-no cognitive concerns at this time   Alert? Yes         Normal Appearance? N/a  Oriented to person? Yes           Place? Yes  Time? Yes  Recall of three objects? Yes  Can perform simple calculations? Yes  Displays appropriate judgment? Yes  Can read the correct time from a watch face? Yes   MMSE - Mini Mental State Exam 11/09/2017  Orientation to time 5  Orientation to Place 5  Registration 3  Attention/ Calculation 5  Recall 2  Language- name 2 objects 2  Language- repeat 1  Language- follow 3 step command 3  Language- read & follow direction 1  Write a sentence 1  Copy design 1  Total score 29        Immunization History  Administered Date(s) Administered  . Fluad Quad(high Dose 65+) 10/17/2018  . Influenza Split 11/21/2008, 02/09/2010, 10/24/2011,  11/21/2012  . Influenza Whole 11/27/2008  . Influenza, High Dose Seasonal PF 11/12/2011, 11/29/2012, 11/05/2013, 01/05/2015, 11/16/2015, 10/21/2016, 11/09/2017  . Influenza,inj,Quad PF,6+ Mos 11/23/2015  . Influenza-Unspecified 02/02/2011  . Pneumococcal Conjugate-13 04/23/2013  . Pneumococcal Polysaccharide-23 02/21/2005  . Tdap 09/22/2007  . Zoster 04/22/2010  . Zoster Recombinat (Shingrix) 07/11/2016, 01/27/2017    Qualifies for Shingles Vaccine? Shingrix completed   Screening Tests Health Maintenance  Topic Date Due  . TETANUS/TDAP  09/21/2017  . DEXA SCAN  09/30/2019  . INFLUENZA VACCINE  Completed  . PNA vac Low Risk Adult  Completed    Cancer Screenings: Lung: Low Dose CT Chest recommended if Age 59-80 years, 30 pack-year currently smoking OR have quit w/in 15years. Patient does not qualify. Breast:  Up to date on Mammogram? Not indicated Up to date of Bone Density/Dexa? Yes Colorectal: not indicated     Plan:  I have personally reviewed and addressed the Medicare Annual Wellness questionnaire and have noted the following in the patient's chart:  A. Medical and social history B. Use of alcohol, tobacco or illicit drugs  C. Current medications and supplements D. Functional ability and status E.  Nutritional status F.  Physical activity G. Advance directives H. List of other physicians I.  Hospitalizations, surgeries, and ER visits in previous 12 months J.  [place] such as hearing and vision if needed, cognitive and depression L. Referrals, records requested, and appointments- none   In addition, I have reviewed and discussed with patient certain preventive protocols, quality metrics, and best practice recommendations. A written personalized care plan for preventive services as well as general preventive health recommendations were provided to patient.   Signed,  Denman George, LPN  Nurse Health Advisor   Nurse Notes: Patient's husband who  suffers from Parkinson's disease recently had a stroke.   They are currently receiving extra support from family.

## 2018-11-23 NOTE — Patient Instructions (Signed)
Robin Harrell , Thank you for taking time to come for your Medicare Wellness Visit. I appreciate your ongoing commitment to your health goals. Please review the following plan we discussed and let me know if I can assist you in the future.   Screening recommendations/referrals: Colorectal Screening: not indicated Mammogram: not indicated Bone Density: up to date; last 09/29/17  Vision and Dental Exams: Recommended annual ophthalmology exams for early detection of glaucoma and other disorders of the eye Recommended annual dental exams for proper oral hygiene  Vaccinations: Influenza vaccine: completed 10/17/18 Pneumococcal vaccine: up to date; last 04/23/13 (Dr. Jonni Sanger may recommended a booster at your December visit)  Tdap vaccine: recommended-Please call your insurance company to determine your out of pocket expense or check with the pharmacy. You can receive the vaccine at the pharmacy.  Shingles vaccine: Shingrix completed   Advanced directives:  We have received a copy of your POA (Power of [place]) and/or Living Will. These documents can be located in your chart.  Goals: Recommend to drink at least 6-8 8oz glasses of water per day.  Continue to be careful of falls.   Next appointment: Please schedule your Annual Wellness Visit with your Nurse Health Advisor in one year.  Preventive Care 75 Years and Older, Female Preventive care refers to lifestyle choices and visits with your health care provider that can promote health and wellness. What does preventive care include?  A yearly physical exam. This is also called an annual well check.  Dental exams once or twice a year.  Routine eye exams. Ask your health care provider how often you should have your eyes checked.  Personal lifestyle choices, including:  Daily care of your teeth and gums.  Regular physical activity.  Eating a healthy diet.  Avoiding tobacco and drug use.  Limiting alcohol use.  Practicing safe sex.   Taking low-dose aspirin every day if recommended by your health care provider.  Taking vitamin and mineral supplements as recommended by your health care provider. What happens during an annual well check? The services and screenings done by your health care provider during your annual well check will depend on your age, overall health, lifestyle risk factors, and family history of disease. Counseling  Your health care provider may ask you questions about your:  Alcohol use.  Tobacco use.  Drug use.  Emotional well-being.  Home and relationship well-being.  Sexual activity.  Eating habits.  History of falls.  Memory and ability to understand (cognition).  Work and work Statistician.  Reproductive health. Screening  You may have the following tests or measurements:  Height, weight, and BMI.  Blood pressure.  Lipid and cholesterol levels. These may be checked every 5 years, or more frequently if you are over 58 years old.  Skin check.  Lung cancer screening. You may have this screening every year starting at age 47 if you have a 30-pack-year history of smoking and currently smoke or have quit within the past 15 years.  Fecal occult blood test (FOBT) of the stool. You may have this test every year starting at age 39.  Flexible sigmoidoscopy or colonoscopy. You may have a sigmoidoscopy every 5 years or a colonoscopy every 10 years starting at age 73.  Hepatitis C blood test.  Hepatitis B blood test.  Sexually transmitted disease (STD) testing.  Diabetes screening. This is done by checking your blood sugar (glucose) after you have not eaten for a while (fasting). You may have this done every 1-3 years.  Bone density scan. This is done to screen for osteoporosis. You may have this done starting at age 73.  Mammogram. This may be done every 1-2 years. Talk to your health care provider about how often you should have regular mammograms. Talk with your health care  provider about your test results, treatment options, and if necessary, the need for more tests. Vaccines  Your health care provider may recommend certain vaccines, such as:  Influenza vaccine. This is recommended every year.  Tetanus, diphtheria, and acellular pertussis (Tdap, Td) vaccine. You may need a Td booster every 10 years.  Zoster vaccine. You may need this after age 15.  Pneumococcal 13-valent conjugate (PCV13) vaccine. One dose is recommended after age 63.  Pneumococcal polysaccharide (PPSV23) vaccine. One dose is recommended after age 24. Talk to your health care provider about which screenings and vaccines you need and how often you need them. This information is not intended to replace advice given to you by your health care provider. Make sure you discuss any questions you have with your health care provider. Document Released: 03/06/2015 Document Revised: 10/28/2015 Document Reviewed: 12/09/2014 Elsevier Interactive Patient Education  2017 [place] Prevention in the Home Falls can cause injuries. They can happen to people of all ages. There are many things you can do to make your home safe and to help prevent falls. What can I do on the outside of my home?  Regularly fix the edges of walkways and driveways and fix any cracks.  Remove anything that might make you trip as you walk through a door, such as a raised step or threshold.  Trim any bushes or trees on the path to your home.  Use bright outdoor lighting.  Clear any walking paths of anything that might make someone trip, such as rocks or tools.  Regularly check to see if handrails are loose or broken. Make sure that both sides of any steps have handrails.  Any raised decks and porches should have guardrails on the edges.  Have any leaves, snow, or ice cleared regularly.  Use sand or salt on walking paths during winter.  Clean up any spills in your garage right away. This includes oil or grease  spills. What can I do in the bathroom?  Use night lights.  Install grab bars by the toilet and in the tub and shower. Do not use towel bars as grab bars.  Use non-skid mats or decals in the tub or shower.  If you need to sit down in the shower, use a plastic, non-slip stool.  Keep the floor dry. Clean up any water that spills on the floor as soon as it happens.  Remove soap buildup in the tub or shower regularly.  Attach bath mats securely with double-sided non-slip rug tape.  Do not have throw rugs and other things on the floor that can make you trip. What can I do in the bedroom?  Use night lights.  Make sure that you have a light by your bed that is easy to reach.  Do not use any sheets or blankets that are too big for your bed. They should not hang down onto the floor.  Have a firm chair that has side arms. You can use this for support while you get dressed.  Do not have throw rugs and other things on the floor that can make you trip. What can I do in the kitchen?  Clean up any spills right away.  Avoid walking  on wet floors.  Keep items that you use a lot in easy-to-reach places.  If you need to reach something above you, use a strong step stool that has a grab bar.  Keep electrical cords out of the way.  Do not use floor polish or wax that makes floors slippery. If you must use wax, use non-skid floor wax.  Do not have throw rugs and other things on the floor that can make you trip. What can I do with my stairs?  Do not leave any items on the stairs.  Make sure that there are handrails on both sides of the stairs and use them. Fix handrails that are broken or loose. Make sure that handrails are as long as the stairways.  Check any carpeting to make sure that it is firmly attached to the stairs. Fix any carpet that is loose or worn.  Avoid having throw rugs at the top or bottom of the stairs. If you do have throw rugs, attach them to the floor with carpet  tape.  Make sure that you have a light switch at the top of the stairs and the bottom of the stairs. If you do not have them, ask someone to add them for you. What else can I do to help prevent falls?  Wear shoes that:  Do not have high heels.  Have rubber bottoms.  Are comfortable and fit you well.  Are closed at the toe. Do not wear sandals.  If you use a stepladder:  Make sure that it is fully opened. Do not climb a closed stepladder.  Make sure that both sides of the stepladder are locked into place.  Ask someone to hold it for you, if possible.  Clearly mark and make sure that you can see:  Any grab bars or handrails.  First and last steps.  Where the edge of each step is.  Use tools that help you move around (mobility aids) if they are needed. These include:  Canes.  Walkers.  Scooters.  Crutches.  Turn on the lights when you go into a dark area. Replace any light bulbs as soon as they burn out.  Set up your furniture so you have a clear path. Avoid moving your furniture around.  If any of your floors are uneven, fix them.  If there are any pets around you, be aware of where they are.  Review your medicines with your doctor. Some medicines can make you feel dizzy. This can increase your chance of falling. Ask your doctor what other things that you can do to help prevent falls. This information is not intended to replace advice given to you by your health care provider. Make sure you discuss any questions you have with your health care provider. Document Released: 12/04/2008 Document Revised: 07/16/2015 Document Reviewed: 03/14/2014 Elsevier Interactive Patient Education  2017 [employer].

## 2018-11-24 NOTE — Progress Notes (Signed)
I have reviewed the documentation from the recent AWV done by Courtney Slade, RN; I agree with the documentation and will follow up on any recommendations or abnormal findings as suggested.  

## 2018-11-26 ENCOUNTER — Ambulatory Visit (INDEPENDENT_AMBULATORY_CARE_PROVIDER_SITE_OTHER): Payer: Medicare Other | Admitting: Pharmacist

## 2018-11-26 ENCOUNTER — Other Ambulatory Visit: Payer: Self-pay

## 2018-11-26 DIAGNOSIS — Z7901 Long term (current) use of anticoagulants: Secondary | ICD-10-CM

## 2018-11-26 DIAGNOSIS — D6851 Activated protein C resistance: Secondary | ICD-10-CM | POA: Diagnosis not present

## 2018-11-26 DIAGNOSIS — Z5181 Encounter for therapeutic drug level monitoring: Secondary | ICD-10-CM | POA: Diagnosis not present

## 2018-11-26 LAB — POCT INR: INR: 5 — AB (ref 2.0–3.0)

## 2018-11-30 ENCOUNTER — Other Ambulatory Visit: Payer: Self-pay

## 2018-11-30 ENCOUNTER — Encounter: Payer: Self-pay | Admitting: Podiatry

## 2018-11-30 ENCOUNTER — Ambulatory Visit (INDEPENDENT_AMBULATORY_CARE_PROVIDER_SITE_OTHER): Payer: Medicare Other | Admitting: Podiatry

## 2018-11-30 DIAGNOSIS — D6851 Activated protein C resistance: Secondary | ICD-10-CM | POA: Diagnosis not present

## 2018-11-30 DIAGNOSIS — M79676 Pain in unspecified toe(s): Secondary | ICD-10-CM | POA: Diagnosis not present

## 2018-11-30 DIAGNOSIS — L84 Corns and callosities: Secondary | ICD-10-CM

## 2018-11-30 DIAGNOSIS — B351 Tinea unguium: Secondary | ICD-10-CM | POA: Diagnosis not present

## 2018-11-30 NOTE — Patient Instructions (Signed)
Corns and Calluses Corns are small areas of thickened skin that occur on the top, sides, or tip of a toe. They contain a cone-shaped core with a point that can press on a nerve below. This causes pain.  Calluses are areas of thickened skin that can occur anywhere on the body, including the hands, fingers, palms, soles of the feet, and heels. Calluses are usually larger than corns. What are the causes? Corns and calluses are caused by rubbing (friction) or pressure, such as from shoes that are too tight or do not fit properly. What increases the risk? Corns are more likely to develop in people who have misshapen toes (toe deformities), such as hammer toes. Calluses can occur with friction to any area of the skin. They are more likely to develop in people who:  Work with their hands.  Wear shoes that fit poorly, are too tight, or are high-heeled.  Have toe deformities. What are the signs or symptoms? Symptoms of a corn or callus include:  A hard growth on the skin.  Pain or tenderness under the skin.  Redness and swelling.  Increased discomfort while wearing tight-fitting shoes, if your feet are affected. If a corn or callus becomes infected, symptoms may include:  Redness and swelling that gets worse.  Pain.  Fluid, blood, or pus draining from the corn or callus. How is this diagnosed? Corns and calluses may be diagnosed based on your symptoms, your medical history, and a physical exam. How is this treated? Treatment for corns and calluses may include:  Removing the cause of the friction or pressure. This may involve: ? Changing your shoes. ? Wearing shoe inserts (orthotics) or other protective layers in your shoes, such as a corn pad. ? Wearing gloves.  Applying medicine to the skin (topical medicine) to help soften skin in the hardened, thickened areas.  Removing layers of dead skin with a file to reduce the size of the corn or callus.  Removing the corn or callus with a  scalpel or laser.  Taking antibiotic medicines, if your corn or callus is infected.  Having surgery, if a toe deformity is the cause. Follow these instructions at home:   Take over-the-counter and prescription medicines only as told by your health care provider.  If you were prescribed an antibiotic, take it as told by your health care provider. Do not stop taking it even if your condition starts to improve.  Wear shoes that fit well. Avoid wearing high-heeled shoes and shoes that are too tight or too loose.  Wear any padding, protective layers, gloves, or orthotics as told by your health care provider.  Soak your hands or feet and then use a file or pumice stone to soften your corn or callus. Do this as told by your health care provider.  Check your corn or callus every day for symptoms of infection. Contact a health care provider if you:  Notice that your symptoms do not improve with treatment.  Have redness or swelling that gets worse.  Notice that your corn or callus becomes painful.  Have fluid, blood, or pus coming from your corn or callus.  Have new symptoms. Summary  Corns are small areas of thickened skin that occur on the top, sides, or tip of a toe.  Calluses are areas of thickened skin that can occur anywhere on the body, including the hands, fingers, palms, and soles of the feet. Calluses are usually larger than corns.  Corns and calluses are caused by   rubbing (friction) or pressure, such as from shoes that are too tight or do not fit properly.  Treatment may include wearing any padding, protective layers, gloves, or orthotics as told by your health care provider. This information is not intended to replace advice given to you by your health care provider. Make sure you discuss any questions you have with your health care provider. Document Released: 11/14/2003 Document Revised: 05/30/2018 Document Reviewed: 12/21/2016 Elsevier Patient Education  2020 Elsevier  Inc.   Onychomycosis/Fungal Toenails  WHAT IS IT? An infection that lies within the keratin of your nail plate that is caused by a fungus.  WHY ME? Fungal infections affect all ages, sexes, races, and creeds.  There may be many factors that predispose you to a fungal infection such as age, coexisting medical conditions such as diabetes, or an autoimmune disease; stress, medications, fatigue, genetics, etc.  Bottom line: fungus thrives in a warm, moist environment and your shoes offer such a location.  IS IT CONTAGIOUS? Theoretically, yes.  You do not want to share shoes, nail clippers or files with someone who has fungal toenails.  Walking around barefoot in the same room or sleeping in the same bed is unlikely to transfer the organism.  It is important to realize, however, that fungus can spread easily from one nail to the next on the same foot.  HOW DO WE TREAT THIS?  There are several ways to treat this condition.  Treatment may depend on many factors such as age, medications, pregnancy, liver and kidney conditions, etc.  It is best to ask your doctor which options are available to you.  1. No treatment.   Unlike many other medical concerns, you can live with this condition.  However for many people this can be a painful condition and may lead to ingrown toenails or a bacterial infection.  It is recommended that you keep the nails cut short to help reduce the amount of fungal nail. 2. Topical treatment.  These range from herbal remedies to prescription strength nail lacquers.  About 40-50% effective, topicals require twice daily application for approximately 9 to 12 months or until an entirely new nail has grown out.  The most effective topicals are medical grade medications available through physicians offices. 3. Oral antifungal medications.  With an 80-90% cure rate, the most common oral medication requires 3 to 4 months of therapy and stays in your system for a year as the new nail grows out.   Oral antifungal medications do require blood work to make sure it is a safe drug for you.  A liver function panel will be performed prior to starting the medication and after the first month of treatment.  It is important to have the blood work performed to avoid any harmful side effects.  In general, this medication safe but blood work is required. 4. Laser Therapy.  This treatment is performed by applying a specialized laser to the affected nail plate.  This therapy is noninvasive, fast, and non-painful.  It is not covered by insurance and is therefore, out of pocket.  The results have been very good with a 80-95% cure rate.  [facility] is the only practice in the area to offer this therapy. 5. Permanent Nail Avulsion.  Removing the entire nail so that a new nail will not grow back. 

## 2018-12-02 NOTE — Progress Notes (Signed)
Subjective: Robin Harrell is a 85 y.o. y.o. female with h/o Factor V deficiency,  who is on long term blood thinner Coumadin presents today with painful, discolored, thick toenails  which interfere with daily activities. Pain is aggravated when wearing enclosed shoe gear. Pain is relieved with periodic professional debridement.  She also has h/o painful preulcerative corns which are managed with periodic debridement. She has had flexor tenotomy right 3rd digit which has resulted in improvement in symptoms.   Robin Harrell states her husband is recovering from a stroke. He is recuperating at home and the family has hired help to assist them.  Current Outpatient Medications on File Prior to Visit  Medication Sig Dispense Refill  . acetaminophen (TYLENOL) 500 MG tablet Take 1,000 mg by mouth every 6 (six) hours as needed for moderate pain.    Marland Kitchen atorvastatin (LIPITOR) 10 MG tablet TAKE 1 TABLET (10 MG TOTAL) BY MOUTH DAILY AT 6 PM. 90 tablet 2  . brimonidine (ALPHAGAN) 0.15 % ophthalmic solution Place 1 drop into both eyes 2 (two) times daily.  3  . Calcium-Phosphorus-Vitamin D (CALCIUM/D3 ADULT GUMMIES) 200-96.6-200 MG-MG-UNIT CHEW Chew 2 tablets by mouth daily.    . cephALEXin (KEFLEX) 500 MG capsule Take 1 capsule (500 mg total) by mouth 2 (two) times daily. 14 capsule 0  . cetirizine (ZYRTEC) 10 MG tablet TAKE 1 TABLET BY MOUTH EVERY DAY 90 tablet 3  . chlorpheniramine (CHLOR-TRIMETON) 4 MG tablet Take 4 mg by mouth every morning.     Marland Kitchen ipratropium (ATROVENT) 0.03 % nasal spray PLACE 2 SPRAYS INTO BOTH NOSTRILS 3 (THREE) TIMES DAILY AS NEEDED FOR RHINITIS. 30 mL 6  . latanoprost (XALATAN) 0.005 % ophthalmic solution Place 1 drop into both eyes at bedtime.     Marland Kitchen levothyroxine (SYNTHROID, LEVOTHROID) 25 MCG tablet TAKE 1 TABLET BY MOUTH EVERY DAY (Patient taking differently: Take 25 mcg by mouth daily before breakfast. ) 90 tablet 3  . losartan (COZAAR) 50 MG tablet Take 1 tablet (50 mg total)  by mouth daily. 90 tablet 1  . metoprolol tartrate (LOPRESSOR) 25 MG tablet TAKE 1 TABLET (25 MG TOTAL) BY MOUTH 2 (TWO) TIMES DAILY. *NEEDS OFFICE VISIT FOR FURTHER REFILLS* 60 tablet 0  . multivitamin-iron-minerals-folic acid (CENTRUM) chewable tablet Chew 1 tablet by mouth daily.    . NON FORMULARY Peripheral neuropathy cream from [place]    . traMADol (ULTRAM) 50 MG tablet Take 1 tablet (50 mg total) by mouth every 6 (six) hours as needed for moderate pain or severe pain. 30 tablet 0  . vitamin E (VITAMIN E) 1000 UNIT capsule Take 1,000 Units by mouth daily.    Marland Kitchen warfarin (COUMADIN) 7.5 MG tablet TAKE 1/2 TO 1 TABLET BY MOUTH DAILY AS DIRECTED 90 tablet 1   No current facility-administered medications on file prior to visit.      Allergies  Allergen Reactions  . Codeine Nausea Only  . Erythromycin Nausea And Vomiting     Objective: Vascular Examination: Capillary refill time <3 seconds x 10 digits.  Dorsalis pedis pulses palpable b/l.  Posterior tibial pulses palpable b/l.  No digital hair x 10 digits.  Skin temperature gradient WNL b/l.  Dermatological Examination: Skin with normal turgor, texture and tone b/l.  Toenails 1-5 b/l discolored, thick, dystrophic with subungual debris and pain with palpation to nailbeds due to thickness of nails.  Hyperkeratotic lesion distal tip right 3rd digit with subdermal hemorrhage and tenderness to palpation. No erythema, no edema, no  drainage, no flocculence noted.  Hyperkeratotic lesion distal tip left 4th digit. No erythema, no edema, no drainage, no flocculence noted.  Musculoskeletal: Muscle strength 5/5 to all LE muscle groups.  Hammertoes lesser digits b/l.  Neurological: Sensation intact with 10 gram monofilament.  Vibratory sensation intact.  Assessment: Painful onychomycosis toenails 1-5 b/l in patient on blood thinner.  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 Toenails 1-5 b/l were debrided in length and girth without iatrogenic bleeding. 2. Corn(s) pared right 3rd digit and left 4th digit utilizing sterile scalpel blade without incident. Dispensed silicone digital pads for daily protection. Continue extra depth shoe gear daily. 3. Patient to report any pedal injuries to medical professional immediately. 4. Avoid self trimming due to use of blood thinner. 5. Follow up 9 weeks.  6. Patient/POA to call should there be a concern in the interim.

## 2018-12-03 IMAGING — CR DG CHEST 2V
2 series · 2 of 2 positions shown · non-contrast
Comparison: 11/16/2010

CLINICAL DATA: Fever, aching and coughing.

EXAM:
CHEST  2 VIEW

[w chest lat]
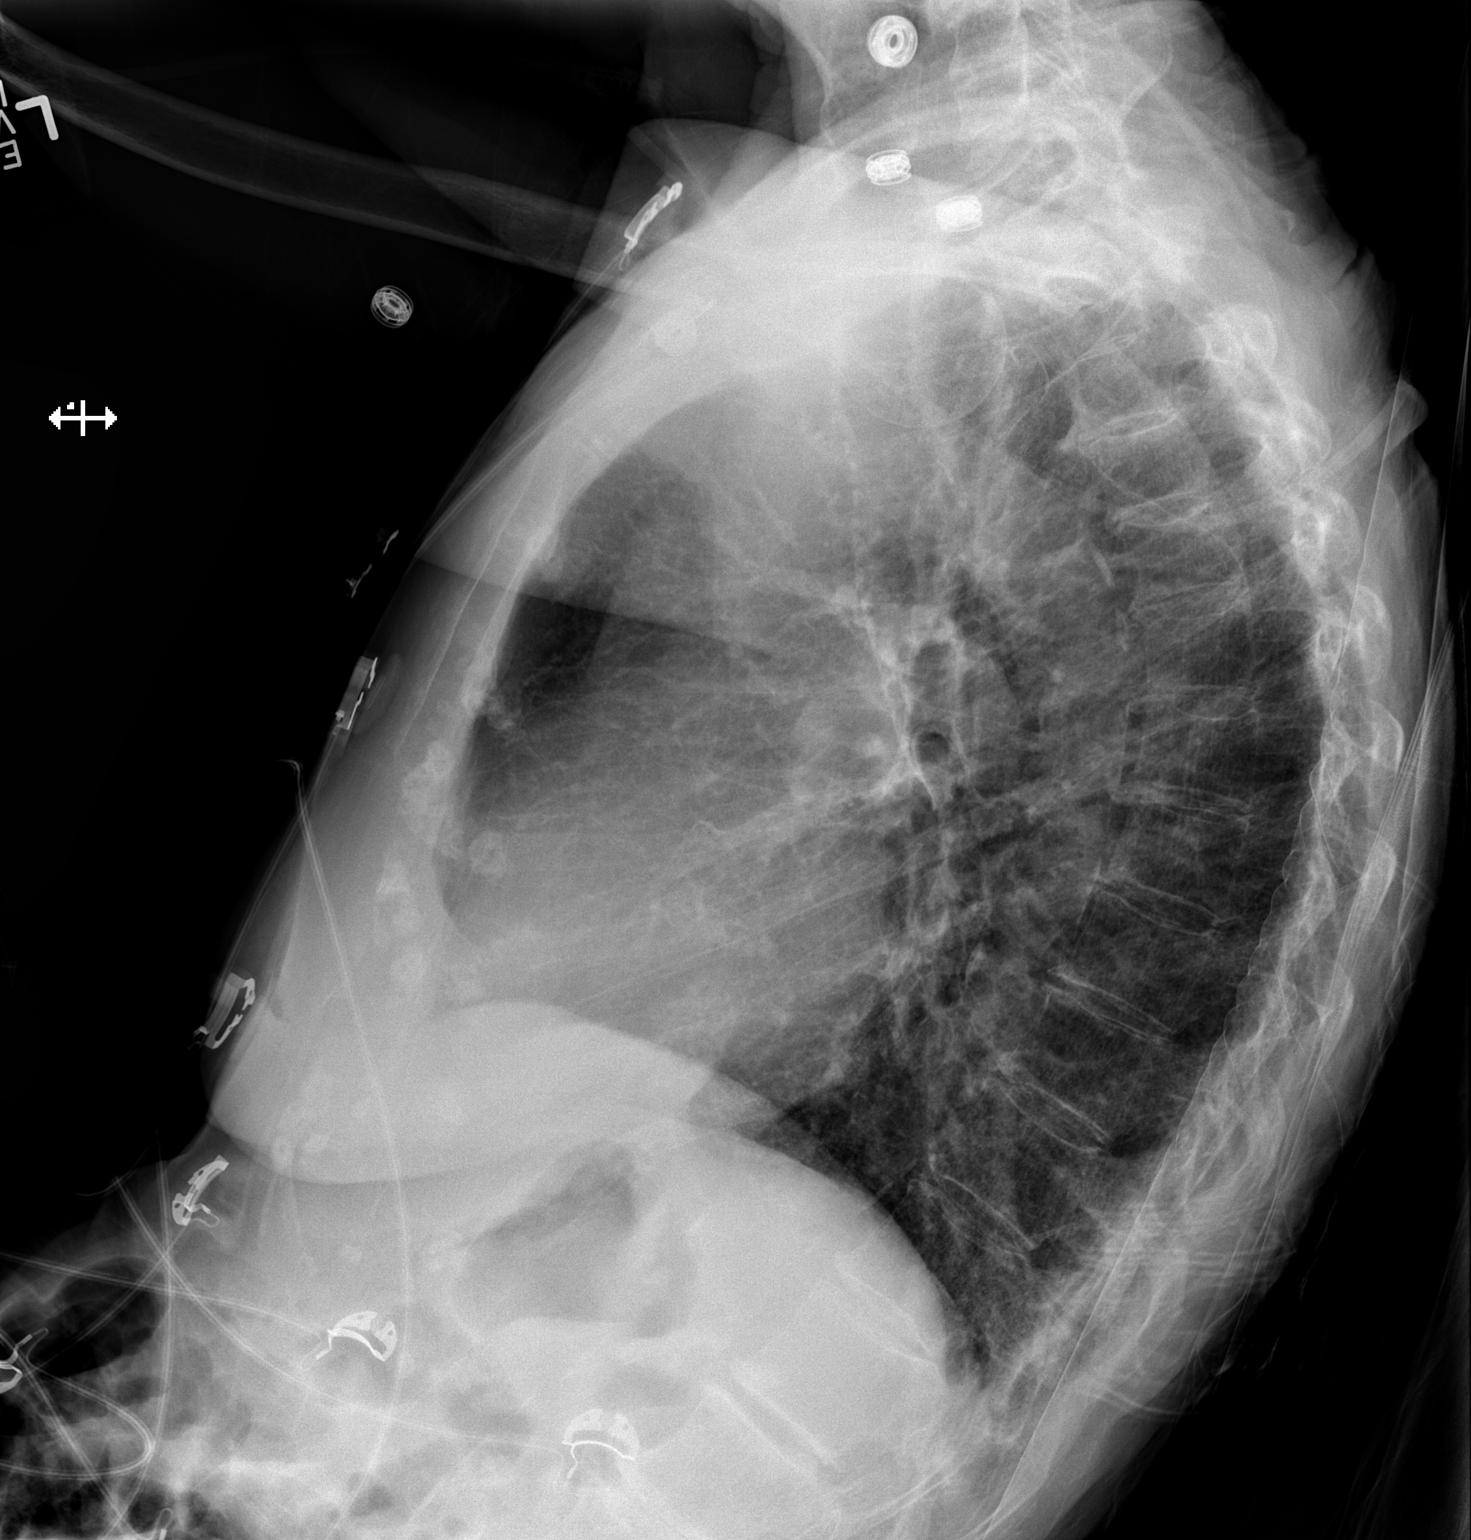

[x chest ap]
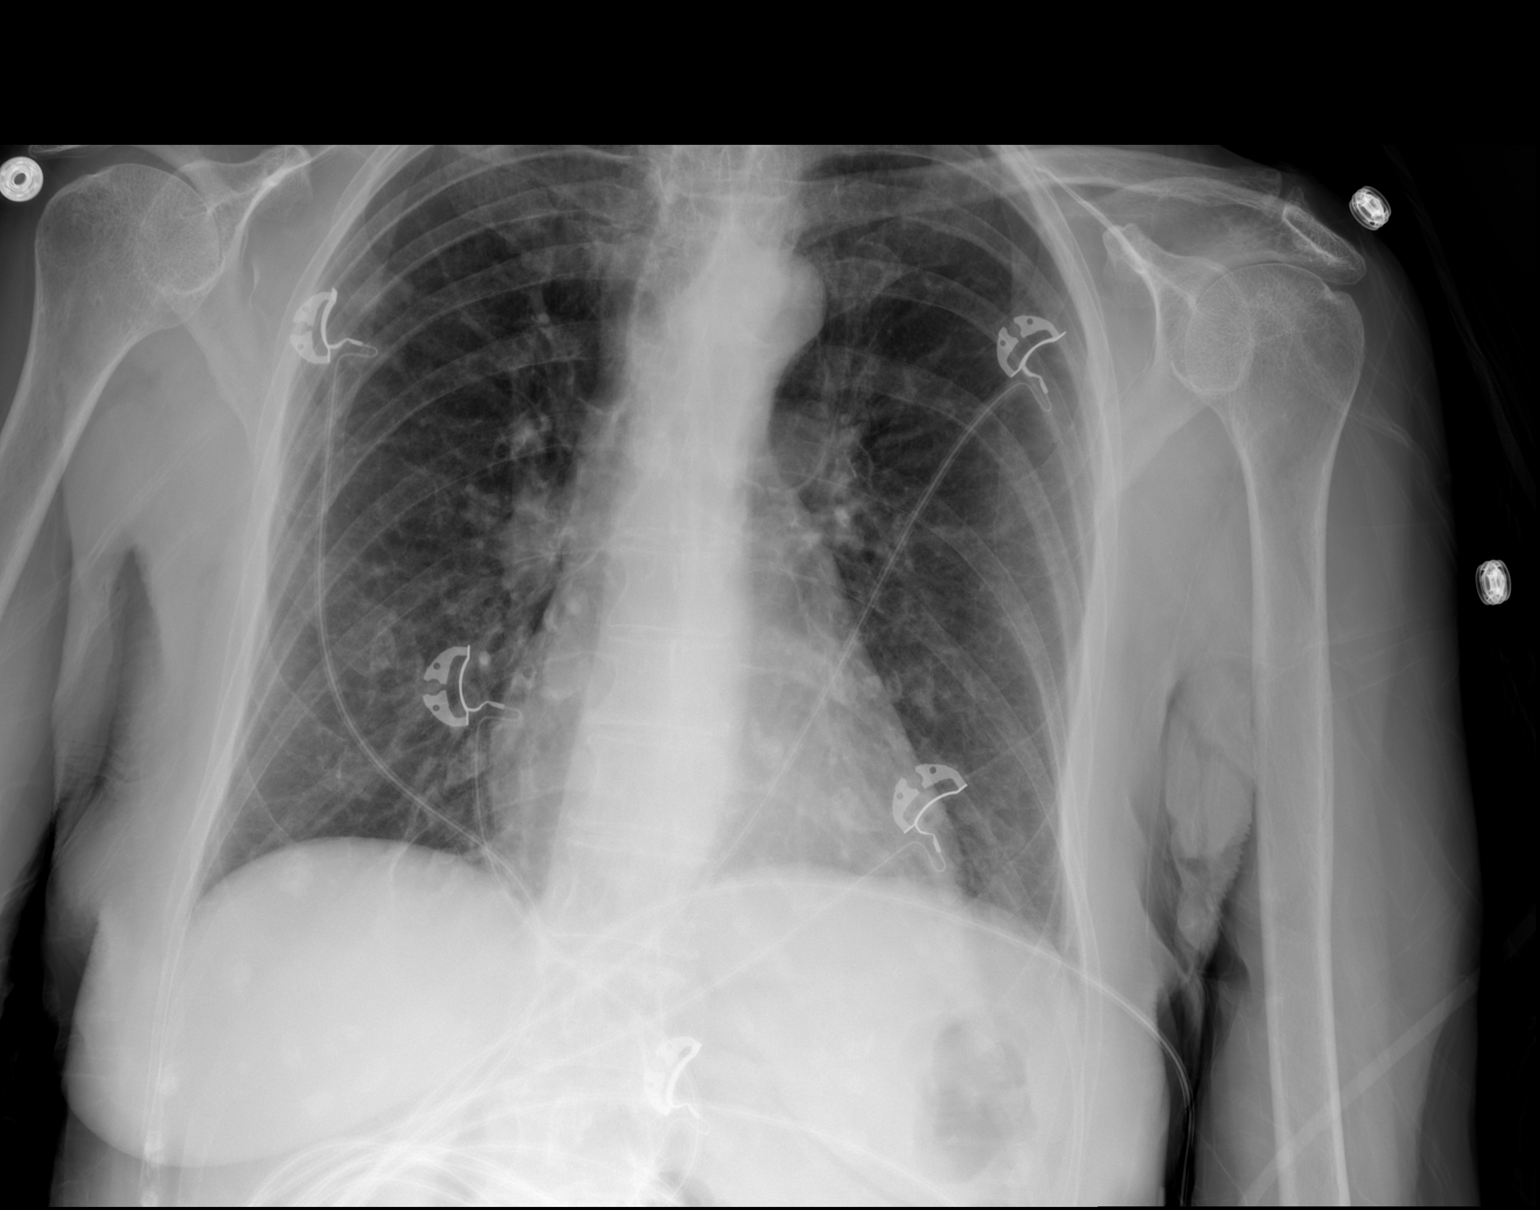

[2 of 2 positions shown; findings below may reference images not displayed]

FINDINGS: Heart size is normal. Mediastinal shadows are normal except for
aortic calcification. There is central bronchial thickening but
there is no infiltrate, collapse or effusion. No acute bone finding.
IMPRESSION: Bronchitis pattern.  No consolidation or collapse.

## 2018-12-10 ENCOUNTER — Telehealth: Payer: Self-pay | Admitting: Family Medicine

## 2018-12-10 MED ORDER — CEPHALEXIN 500 MG PO CAPS: 500.0000 mg | ORAL_CAPSULE | Freq: Two times a day (BID) | ORAL | 0 refills | Status: DC

## 2018-12-10 NOTE — Telephone Encounter (Signed)
Please advise 

## 2018-12-10 NOTE — Addendum Note (Signed)
Addended by: Billey Chang on: 12/10/2018 11:25 AM   Modules accepted: Orders

## 2018-12-10 NOTE — Telephone Encounter (Signed)
LMOVM for pt to return call.Madaline Brilliant for PEC to Discuss PCP recommendations

## 2018-12-10 NOTE — Telephone Encounter (Signed)
See note

## 2018-12-10 NOTE — Telephone Encounter (Signed)
Pt called in , pt aware of meds called in and she will call back to office to make an appt if this is not improving. Pt expressed understanding

## 2018-12-10 NOTE — Telephone Encounter (Signed)
Patient called with UTI symptoms, burning during urination over the weekend. She is not able to drive and doesn't have a care giver coming until late in the afternoon and is requesting to not have to come into the office for an appt. Patient would like something sent in for her without her having to come in the office. Thank you!

## 2018-12-10 NOTE — Telephone Encounter (Signed)
Has h/o recurrent UTI.  Called in Keflex.   If not improving, will need OV and urine sample. Thanks.

## 2018-12-13 ENCOUNTER — Other Ambulatory Visit: Payer: Self-pay

## 2018-12-13 ENCOUNTER — Ambulatory Visit (INDEPENDENT_AMBULATORY_CARE_PROVIDER_SITE_OTHER): Payer: Medicare Other | Admitting: Pharmacist

## 2018-12-13 ENCOUNTER — Other Ambulatory Visit: Payer: Self-pay | Admitting: Cardiovascular Disease

## 2018-12-13 DIAGNOSIS — D6851 Activated protein C resistance: Secondary | ICD-10-CM

## 2018-12-13 DIAGNOSIS — Z7901 Long term (current) use of anticoagulants: Secondary | ICD-10-CM | POA: Diagnosis not present

## 2018-12-13 DIAGNOSIS — Z5181 Encounter for therapeutic drug level monitoring: Secondary | ICD-10-CM | POA: Diagnosis not present

## 2018-12-13 LAB — POCT INR: INR: 3.1 — AB (ref 2.0–3.0)

## 2018-12-16 ENCOUNTER — Other Ambulatory Visit: Payer: Self-pay | Admitting: Cardiovascular Disease

## 2018-12-28 ENCOUNTER — Ambulatory Visit: Payer: Self-pay | Admitting: *Deleted

## 2018-12-28 DIAGNOSIS — Z85828 Personal history of other malignant neoplasm of skin: Secondary | ICD-10-CM | POA: Diagnosis not present

## 2018-12-28 DIAGNOSIS — D0461 Carcinoma in situ of skin of right upper limb, including shoulder: Secondary | ICD-10-CM | POA: Diagnosis not present

## 2018-12-28 DIAGNOSIS — L570 Actinic keratosis: Secondary | ICD-10-CM | POA: Diagnosis not present

## 2018-12-28 NOTE — Telephone Encounter (Signed)
Do you want me to call and have come in or go to walk in

## 2018-12-28 NOTE — Telephone Encounter (Signed)
If no pain or SOB, can schedule OV with me next week. thanks

## 2018-12-28 NOTE — Telephone Encounter (Signed)
Pt called with having swelling, in the left leg that her care giver noticed yesterday. She had her elevated her leg for most of the day. Today she has not elevated the leg. She denies pain, fever or redness. She is on a blood thinner and has a hx of blood clots. She is advised to elevate her leg and to call 911 for shortness of breath or respiratory distress. She voiced understanding. Per protocol she needs to be been within 4 hours. LB at [place] notified regarding an appointment. .  Reason for Disposition . [1] Thigh, calf, or ankle swelling AND [2] only 1 side  Answer Assessment - Initial Assessment Questions 1. ONSET: "When did the swelling start?" (e.g., minutes, hours, days)     yesterday 2. LOCATION: "What part of the leg is swollen?"  "Are both legs swollen or just one leg?"     Below the knee on the left leg 3. SEVERITY: "How bad is the swelling?" (e.g., localized; mild, moderate, severe)  - Localized - small area of swelling localized to one leg  - MILD pedal edema - swelling limited to foot and ankle, pitting edema < 1/4 inch (6 mm) deep, rest and elevation eliminate most or all swelling  - MODERATE edema - swelling of lower leg to knee, pitting edema > 1/4 inch (6 mm) deep, rest and elevation only partially reduce swelling  - SEVERE edema - swelling extends above knee, facial or hand swelling present      moderate 4. REDNESS: "Does the swelling look red or infected?"     no 5. PAIN: "Is the swelling painful to touch?" If so, ask: "How painful is it?"   (Scale 1-10; mild, moderate or severe)     no 6. FEVER: "Do you have a fever?" If so, ask: "What is it, how was it measured, and when did it start?"      no 7. CAUSE: "What do you think is causing the leg swelling?"     Not sure 8. MEDICAL HISTORY: "Do you have a history of heart failure, kidney disease, liver failure, or cancer?"     Skin cancer 9. RECURRENT SYMPTOM: "Have you had leg swelling before?" If so, ask:  "When was the last time?" "What happened that time?"     no 10. OTHER SYMPTOMS: "Do you have any other symptoms?" (e.g., chest pain, difficulty breathing)       no 11. PREGNANCY: "Is there any chance you are pregnant?" "When was your last menstrual period?"       n/a  Protocols used: LEG SWELLING AND EDEMA-A-AH

## 2018-12-28 NOTE — Telephone Encounter (Signed)
I just got this message, would Dr. Jonni Sanger be able to work her in today?

## 2018-12-28 NOTE — Telephone Encounter (Signed)
Called patient app made for Monday reviewed red words if any she will go to ed.

## 2018-12-31 ENCOUNTER — Encounter: Payer: Self-pay | Admitting: Family Medicine

## 2018-12-31 ENCOUNTER — Ambulatory Visit (INDEPENDENT_AMBULATORY_CARE_PROVIDER_SITE_OTHER): Payer: Medicare Other | Admitting: Family Medicine

## 2018-12-31 ENCOUNTER — Other Ambulatory Visit: Payer: Self-pay

## 2018-12-31 VITALS — BP 118/74 | HR 74 | Temp 97.8°F | Ht 62.0 in

## 2018-12-31 DIAGNOSIS — R300 Dysuria: Secondary | ICD-10-CM

## 2018-12-31 DIAGNOSIS — I5189 Other ill-defined heart diseases: Secondary | ICD-10-CM | POA: Diagnosis not present

## 2018-12-31 DIAGNOSIS — R600 Localized edema: Secondary | ICD-10-CM

## 2018-12-31 DIAGNOSIS — Z7901 Long term (current) use of anticoagulants: Secondary | ICD-10-CM

## 2018-12-31 DIAGNOSIS — I872 Venous insufficiency (chronic) (peripheral): Secondary | ICD-10-CM

## 2018-12-31 LAB — URINALYSIS, ROUTINE W REFLEX MICROSCOPIC
Bilirubin Urine: NEGATIVE
Hgb urine dipstick: NEGATIVE
Ketones, ur: NEGATIVE
Nitrite: POSITIVE — AB
RBC / HPF: NONE SEEN (ref 0–?)
Specific Gravity, Urine: 1.02 (ref 1.000–1.030)
Total Protein, Urine: NEGATIVE
Urine Glucose: NEGATIVE
Urobilinogen, UA: 0.2 (ref 0.0–1.0)
pH: 5.5 (ref 5.0–8.0)

## 2018-12-31 MED ORDER — NITROFURANTOIN MONOHYD MACRO 100 MG PO CAPS: 100.0000 mg | ORAL_CAPSULE | Freq: Two times a day (BID) | ORAL | 0 refills | Status: DC

## 2018-12-31 NOTE — Patient Instructions (Signed)
Please follow up as scheduled for your next visit with me: 01/29/2019   If you have any questions or concerns, please don't hesitate to send me a message via MyChart or call the office at [phone]. Thank you for visiting with Robin Harrell today! It's our pleasure caring for you.  I believe your leg swelling is due to several things including arthritis from your knee. Keep an eye on it, monitor your salt intake and elevate your legs as you can. We will recheck it next month.  I do NOT believe you have a clot in your leg at this time.   I will let you know about your urine test results.

## 2018-12-31 NOTE — Progress Notes (Signed)
Subjective  CC:  Chief Complaint  Patient presents with  . Acute Visit  . Leg Swelling    HPI: Robin Harrell is a 85 y.o. female who presents to the office today to address the problems listed above in the chief complaint.  85 year old female with 3 to 4-day history of left lower extremity swelling first noted by her home health care nurse.  Patient did not notice a problem.  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  Patient reports left posterior lateral calf more swollen without redness, warmth or tenderness.  Over the weekend, the swelling has mostly resolved.  She does have chronic diastolic dysfunction but has not required diuretics to manage it.  She does have chronic dermopathy skin changes in the lower extremities.  She feels well without shortness of breath or chest pain.  No fevers or palpitations.  She denies recent no extremity injuries.  She does report that 2 days prior to the noted swelling she had left knee pain.  She has osteoarthritis.  Both pain and swelling have improved considerably.  Also complains of dysuria once yesterday.  Has history of UTIs.  Today no urinary symptoms are present.  No fevers or chills or flank pain.  Assessment  1. Leg edema, left   2. Diastolic dysfunction   3. Long term (current) use of anticoagulants   4. Dysuria   5. Chronic venous stasis dermatitis of both lower extremities      Plan   Left leg swelling: No signs of DVT, unlikely as she is anticoagulated.  Could be related to Baker's cyst or knee effusion from osteoarthritis.  Improved.  Recommend monitoring and leg elevation.  If swelling becomes more of a consistent problem, we can discuss adding a diuretic.  Continue anticoagulation and monitoring at the anticoagulation clinic  Dysuria: Check urine studies.  Urinalysis reviewed and likely with urinary tract infection.  Will send in antibiotics and  notify patient.  Follow up: Return for as scheduled.  01/29/2019  Orders Placed This Encounter  Procedures  . Urine Culture  . UA, micro   No orders of the defined types were placed in this encounter.     I reviewed the patients updated PMH, FH, and SocHx.    Patient Active Problem List   Diagnosis Date Noted  . Hypothyroidism (acquired) 09/02/2014    Priority: High  . Hyperlipidemia 11/19/2012    Priority: High  . Long term (current) use of anticoagulants 05/10/2012    Priority: High  . Diastolic dysfunction [id number]    Priority: High  . Factor V deficiency ([place]) 02/09/2010    Priority: High  . Osteoporosis 02/09/2010    Priority: High  . Essential hypertension 11/15/2007    Priority: High  . Aortic stenosis, mild 02/07/2017    Priority: Medium  . Chronic idiopathic thrombocytopenia ([facility]) 04/14/2016    Priority: Medium  . Essential tremor 09/03/2015    Priority: Medium  . Edema of right lower extremity 02/01/2013    Priority: Medium  . Colon polyp 10/15/2012    Priority: Medium  . Insomnia 10/11/2011    Priority: Medium  . Osteoarthritis, multiple sites 06/30/2011    Priority: Medium  . DJD (degenerative joint disease), lumbar 10/14/2009    Priority: Medium  . Glaucoma 01/29/2008    Priority: Medium  . History of DVT (deep vein thrombosis) 11/15/2007    Priority: Medium  . GERD 11/15/2007  Priority: Medium  . COUGH, CHRONIC 11/15/2007    Priority: Medium  . History of recurrent UTIs 01/26/2017    Priority: Low  . History of respiratory failure 11/11/2014    Priority: Low  . Seasonal and perennial allergic rhinitis 11/15/2007    Priority: Low  . Chronic venous stasis dermatitis of both lower extremities 12/31/2018   Current Meds  Medication Sig  . acetaminophen (TYLENOL) 500 MG tablet Take 1,000 mg by mouth every 6 (six) hours as needed for moderate pain.  Marland Kitchen atorvastatin (LIPITOR) 10 MG tablet TAKE 1 TABLET (10 MG TOTAL) BY MOUTH DAILY AT 6 PM.   . brimonidine (ALPHAGAN) 0.15 % ophthalmic solution Place 1 drop into both eyes 2 (two) times daily.  . Calcium-Phosphorus-Vitamin D (CALCIUM/D3 ADULT GUMMIES) 200-96.6-200 MG-MG-UNIT CHEW Chew 2 tablets by mouth daily.  . cephALEXin (KEFLEX) 500 MG capsule Take 1 capsule (500 mg total) by mouth 2 (two) times daily.  . cetirizine (ZYRTEC) 10 MG tablet TAKE 1 TABLET BY MOUTH EVERY DAY  . chlorpheniramine (CHLOR-TRIMETON) 4 MG tablet Take 4 mg by mouth every morning.   Marland Kitchen ipratropium (ATROVENT) 0.03 % nasal spray PLACE 2 SPRAYS INTO BOTH NOSTRILS 3 (THREE) TIMES DAILY AS NEEDED FOR RHINITIS.  Marland Kitchen latanoprost (XALATAN) 0.005 % ophthalmic solution Place 1 drop into both eyes at bedtime.   Marland Kitchen levothyroxine (SYNTHROID, LEVOTHROID) 25 MCG tablet TAKE 1 TABLET BY MOUTH EVERY DAY (Patient taking differently: Take 25 mcg by mouth daily before breakfast. )  . losartan (COZAAR) 50 MG tablet Take 1 tablet (50 mg total) by mouth daily.  . metoprolol tartrate (LOPRESSOR) 25 MG tablet TAKE 1 TABLET (25 MG TOTAL) BY MOUTH 2 (TWO) TIMES DAILY. *NEEDS OFFICE VISIT FOR FURTHER REFILLS*  . multivitamin-iron-minerals-folic acid (CENTRUM) chewable tablet Chew 1 tablet by mouth daily.  . NON FORMULARY Peripheral neuropathy cream from [place]  . traMADol (ULTRAM) 50 MG tablet Take 1 tablet (50 mg total) by mouth every 6 (six) hours as needed for moderate pain or severe pain.  . vitamin E (VITAMIN E) 1000 UNIT capsule Take 1,000 Units by mouth daily.  Marland Kitchen warfarin (COUMADIN) 7.5 MG tablet TAKE 1/2 TO 1 TABLET BY MOUTH DAILY AS DIRECTED    Allergies: Patient is allergic to codeine and erythromycin. Family History: Patient family history includes Allergic rhinitis in her sister; Breast cancer (age of onset: 73) in her daughter; Cirrhosis in her father; Coronary artery disease in her maternal grandfather and paternal grandfather; Heart disease in her paternal grandmother; Lung cancer in her father; Skin cancer in her  brother; Throat cancer in her maternal grandfather and paternal grandfather. Social History:  Patient  reports that she quit smoking about 60 years ago. Her smoking use included cigarettes. She quit after 1.00 year of use. She has never used smokeless tobacco. She reports current alcohol use. She reports that she does not use drugs.  Review of Systems: Constitutional: Negative for fever malaise or anorexia Cardiovascular: negative for chest pain Respiratory: negative for SOB or persistent cough Gastrointestinal: negative for abdominal pain  Objective  Vitals: BP 118/74 (BP Location: Left Arm, Patient Position: Sitting, Cuff Size: Normal)   Pulse 74   Temp 97.8 F (36.6 C) (Oral)   Ht 5\' 2"  (1.575 m)   LMP  (LMP Unknown)   SpO2 98%   BMI 23.78 kg/m  General: no acute distress , A&Ox3, kyphotic HEENT: PEERL, conjunctiva normal, Oropharynx moist,neck is supple Cardiovascular:  RRR without murmur or gallop.  Respiratory:  Good breath sounds bilaterally, CTAB with normal respiratory effort Skin:  Warm, no rashes Bilateral lower extremities with trace pitting edema to mid calf, brawny induration noted, no rashes, warmth or significant tenderness.  No cords.  Office Visit on 12/31/2018  Component Date Value Ref Range Status  . Color, Urine 12/31/2018 YELLOW  Yellow;Lt. Yellow;Straw;Dark Yellow;Amber;Green;Red;Brown Final  . APPearance 12/31/2018 Sl Cloudy* Clear;Turbid;Slightly Cloudy;Cloudy Final  . Specific Gravity, Urine 12/31/2018 1.020  1.000 - 1.030 Final  . pH 12/31/2018 5.5  5.0 - 8.0 Final  . Total Protein, Urine 12/31/2018 NEGATIVE  Negative Final  . Urine Glucose 12/31/2018 NEGATIVE  Negative Final  . Ketones, ur 12/31/2018 NEGATIVE  Negative Final  . Bilirubin Urine 12/31/2018 NEGATIVE  Negative Final  . Hgb urine dipstick 12/31/2018 NEGATIVE  Negative Final  . Urobilinogen, UA 12/31/2018 0.2  0.0 - 1.0 Final  . Leukocytes,Ua 12/31/2018 TRACE* Negative Final  . Nitrite  12/31/2018 POSITIVE* Negative Final  . WBC, UA 12/31/2018 11-20/hpf* 0-2/hpf Final  . RBC / HPF 12/31/2018 none seen  0-2/hpf Final  . Squamous Epithelial / LPF 12/31/2018 Rare(0-4/hpf)  Rare(0-4/hpf) Final  . Bacteria, UA 12/31/2018 Many(>50/hpf)* None Final      Commons side effects, risks, benefits, and alternatives for medications and treatment plan prescribed today were discussed, and the patient expressed understanding of the given instructions. Patient is instructed to call or message via MyChart if he/she has any questions or concerns regarding our treatment plan. No barriers to understanding were identified. We discussed Red Flag symptoms and signs in detail. Patient expressed understanding regarding what to do in case of urgent or emergency type symptoms.   Medication list was reconciled, printed and provided to the patient in AVS. Patient instructions and summary information was reviewed with the patient as documented in the AVS. This note was prepared with assistance of Dragon voice recognition software. Occasional wrong-word or sound-a-like substitutions may have occurred due to the inherent limitations of voice recognition software

## 2019-01-02 LAB — URINE CULTURE
MICRO NUMBER:: 1080679
SPECIMEN QUALITY:: ADEQUATE

## 2019-01-02 NOTE — Progress Notes (Signed)
+   UTI sensitive to abx used. No further action needed

## 2019-01-03 DIAGNOSIS — H401132 Primary open-angle glaucoma, bilateral, moderate stage: Secondary | ICD-10-CM | POA: Diagnosis not present

## 2019-01-03 DIAGNOSIS — H26492 Other secondary cataract, left eye: Secondary | ICD-10-CM | POA: Diagnosis not present

## 2019-01-03 DIAGNOSIS — Z961 Presence of intraocular lens: Secondary | ICD-10-CM | POA: Diagnosis not present

## 2019-01-07 ENCOUNTER — Ambulatory Visit (INDEPENDENT_AMBULATORY_CARE_PROVIDER_SITE_OTHER): Payer: Medicare Other | Admitting: Pharmacist

## 2019-01-07 ENCOUNTER — Other Ambulatory Visit: Payer: Self-pay

## 2019-01-07 DIAGNOSIS — Z7901 Long term (current) use of anticoagulants: Secondary | ICD-10-CM | POA: Diagnosis not present

## 2019-01-07 DIAGNOSIS — D6851 Activated protein C resistance: Secondary | ICD-10-CM | POA: Diagnosis not present

## 2019-01-07 DIAGNOSIS — Z5181 Encounter for therapeutic drug level monitoring: Secondary | ICD-10-CM

## 2019-01-07 LAB — POCT INR: INR: 2.3 (ref 2.0–3.0)

## 2019-01-15 ENCOUNTER — Telehealth: Payer: Self-pay | Admitting: *Deleted

## 2019-01-15 NOTE — Telephone Encounter (Signed)
Pt states her end of the toe is red and now black and hurts.

## 2019-01-15 NOTE — Telephone Encounter (Signed)
I called pt and she states her dtr said it looks like the toe has been clipped and is painful. I scheduled pt with Dr. Paulla Dolly 01/16/2019 at 9:15am.

## 2019-01-16 ENCOUNTER — Ambulatory Visit (INDEPENDENT_AMBULATORY_CARE_PROVIDER_SITE_OTHER): Payer: Medicare Other | Admitting: Podiatry

## 2019-01-16 ENCOUNTER — Ambulatory Visit (INDEPENDENT_AMBULATORY_CARE_PROVIDER_SITE_OTHER): Payer: Medicare Other | Admitting: Emergency Medicine

## 2019-01-16 ENCOUNTER — Other Ambulatory Visit: Payer: Self-pay

## 2019-01-16 ENCOUNTER — Encounter: Payer: Self-pay | Admitting: Podiatry

## 2019-01-16 ENCOUNTER — Encounter: Payer: Self-pay | Admitting: Emergency Medicine

## 2019-01-16 DIAGNOSIS — M2042 Other hammer toe(s) (acquired), left foot: Secondary | ICD-10-CM | POA: Diagnosis not present

## 2019-01-16 DIAGNOSIS — L84 Corns and callosities: Secondary | ICD-10-CM | POA: Diagnosis not present

## 2019-01-16 DIAGNOSIS — R05 Cough: Secondary | ICD-10-CM

## 2019-01-16 DIAGNOSIS — R059 Cough, unspecified: Secondary | ICD-10-CM

## 2019-01-16 DIAGNOSIS — D689 Coagulation defect, unspecified: Secondary | ICD-10-CM | POA: Diagnosis not present

## 2019-01-16 DIAGNOSIS — M2041 Other hammer toe(s) (acquired), right foot: Secondary | ICD-10-CM

## 2019-01-16 MED ORDER — FLUTICASONE PROPIONATE 50 MCG/ACT NA SUSP: 2.0000 | Freq: Every day | NASAL | 2 refills | Status: DC

## 2019-01-16 NOTE — Progress Notes (Signed)
Virtual Visit via Telephone Note  I connected with Robin Harrell on 01/16/19 at 11:00 AM EST by telephone and verified that I am speaking with the correct person using two identifiers.  Location: Patient: Home Provider: Office   I discussed the limitations, risks, security and privacy concerns of performing an evaluation and management service by telephone and the availability of in person appointments. I also discussed with the patient that there may be a patient responsible charge related to this service. The patient expressed understanding and agreed to proceed.   History of Present Illness: Robin Harrell is 80 and has a history of chronic allergies, chronic cough, possible dysphagia.  Also with a history of chronic thromboembolic disease, factor V Leiden deficiency PE (on Coumadin).   Observations/Objective: She reports more trouble with her cough and nasal congestion since last 2 weeks. Clear mucous, having some paroxysms of cough, uses ipratropium NS 2-3x a day. On zyrtec, chlorpheniramine. Not on a NS  Assessment and Plan: Will continue the Zyrtec and chlorpheniramine for now.  She can also continue the ipratropium nasal spray if needed.  I think she needs to go back on dedicated nasal saline rinses at least for the next few weeks and she is willing to do this.  Also start fluticasone nasal spray once daily.  I asked her to consider adding guaifenesin if the mucus remains thick and she has difficulty clearing from her throat.   Follow Up Instructions: 3 to 4 weeks telephone visit with myself or APP.   I discussed the assessment and treatment plan with the patient. The patient was provided an opportunity to ask questions and all were answered. The patient agreed with the plan and demonstrated an understanding of the instructions.   The patient was advised to call back or seek an in-person evaluation if the symptoms worsen or if the condition fails to improve as anticipated.  I  provided 10 minutes of non-face-to-face time during this encounter.   Collene Gobble, MD

## 2019-01-16 NOTE — Progress Notes (Signed)
Subjective:   Patient ID: Robin Harrell, female   DOB: 85 y.o.   MRN: [id number]   HPI Patient presents stating she has hammertoe deformity with a painful lesion at the end of the third toe right that did not respond to previous treatment with patient on blood thinners.  Patient stated she did have a tenotomy done in the past on the left that seemed to help her and she may need that done on the right if we can get better   ROS      Objective:  Physical Exam  Neurovascular status unchanged with patient found to have a distal keratotic lesion digit three right is painful when palpated and making walking difficult.  Patient is noted to have significant hammertoe deformity which is contributory towards the pain she experiences     Assessment:  Distal keratotic lesion digit three right with pain along with hammertoe deformity     Plan:  H&P reviewed condition and educated patient on causes and treatment of hammertoes.  At this point using sterile instrumentation I did do distal debridement of third digit right without any iatrogenic bleeding and I applied a buttress pad to try to lift the toes and if this gives relief will not have to do surgery.  If it does it may require tenotomy and I did advise patient of that fact

## 2019-01-16 NOTE — Patient Instructions (Signed)
Please continue your chlorpheniramine, cetirizine as you have been taking them. Continue your ipratropium nasal spray 2-3 times daily when you need it for nasal congestion. Please start nasal saline rinses once daily. Please start fluticasone nasal spray, 2 sprays each nostril once daily You may want to try guaifenesin (Mucinex) 600 mg once daily to loosen your mucus and make it easier to clear from your throat. We will have a repeat phone visit in 3 to 4 weeks to see if you are making any progress.

## 2019-01-16 NOTE — Addendum Note (Signed)
Addended by: Lorretta Harp on: 01/16/2019 11:36 AM   Modules accepted: Orders

## 2019-01-18 NOTE — Telephone Encounter (Signed)
Regular mucinex 600mg  tablet twice daily or delsym cough syrup.

## 2019-01-28 ENCOUNTER — Other Ambulatory Visit: Payer: Self-pay

## 2019-01-29 ENCOUNTER — Encounter: Payer: Self-pay | Admitting: Family Medicine

## 2019-01-29 ENCOUNTER — Other Ambulatory Visit: Payer: Self-pay | Admitting: Cardiovascular Disease

## 2019-01-29 ENCOUNTER — Ambulatory Visit (INDEPENDENT_AMBULATORY_CARE_PROVIDER_SITE_OTHER): Payer: Medicare Other | Admitting: Family Medicine

## 2019-01-29 VITALS — BP 124/72 | HR 80 | Temp 97.2°F | Ht 62.0 in | Wt 129.0 lb

## 2019-01-29 DIAGNOSIS — M8949 Other hypertrophic osteoarthropathy, multiple sites: Secondary | ICD-10-CM | POA: Diagnosis not present

## 2019-01-29 DIAGNOSIS — E039 Hypothyroidism, unspecified: Secondary | ICD-10-CM | POA: Diagnosis not present

## 2019-01-29 DIAGNOSIS — Z7901 Long term (current) use of anticoagulants: Secondary | ICD-10-CM

## 2019-01-29 DIAGNOSIS — J3089 Other allergic rhinitis: Secondary | ICD-10-CM

## 2019-01-29 DIAGNOSIS — J302 Other seasonal allergic rhinitis: Secondary | ICD-10-CM

## 2019-01-29 DIAGNOSIS — M810 Age-related osteoporosis without current pathological fracture: Secondary | ICD-10-CM | POA: Diagnosis not present

## 2019-01-29 DIAGNOSIS — M4010 Other secondary kyphosis, site unspecified: Secondary | ICD-10-CM | POA: Diagnosis not present

## 2019-01-29 DIAGNOSIS — E782 Mixed hyperlipidemia: Secondary | ICD-10-CM

## 2019-01-29 DIAGNOSIS — M159 Polyosteoarthritis, unspecified: Secondary | ICD-10-CM

## 2019-01-29 DIAGNOSIS — D682 Hereditary deficiency of other clotting factors: Secondary | ICD-10-CM

## 2019-01-29 DIAGNOSIS — I10 Essential (primary) hypertension: Secondary | ICD-10-CM

## 2019-01-29 LAB — COMPREHENSIVE METABOLIC PANEL
ALT: 16 U/L (ref 0–35)
AST: 23 U/L (ref 0–37)
Albumin: 4.2 g/dL (ref 3.5–5.2)
Alkaline Phosphatase: 59 U/L (ref 39–117)
BUN: 24 mg/dL — ABNORMAL HIGH (ref 6–23)
CO2: 27 mEq/L (ref 19–32)
Calcium: 9.3 mg/dL (ref 8.4–10.5)
Chloride: 102 mEq/L (ref 96–112)
Creatinine, Ser: 1.03 mg/dL (ref 0.40–1.20)
GFR: 51.17 mL/min — ABNORMAL LOW (ref >60.00)
Glucose, Bld: 99 mg/dL (ref 70–99)
Potassium: 4.1 mEq/L (ref 3.5–5.1)
Sodium: 136 mEq/L (ref 135–145)
Total Bilirubin: 0.7 mg/dL (ref 0.2–1.2)
Total Protein: 7 g/dL (ref 6.0–8.3)

## 2019-01-29 LAB — CBC WITH DIFFERENTIAL/PLATELET
Basophils Absolute: 0 10*3/uL (ref 0.0–0.1)
Basophils Relative: 0.9 % (ref 0.0–3.0)
Eosinophils Absolute: 0.1 10*3/uL (ref 0.0–0.7)
Eosinophils Relative: 1.5 % (ref 0.0–5.0)
HCT: 37.2 % (ref 36.0–46.0)
Hemoglobin: 12.1 g/dL (ref 12.0–15.0)
Lymphocytes Relative: 23.4 % (ref 12.0–46.0)
Lymphs Abs: 1.2 10*3/uL (ref 0.7–4.0)
MCHC: 32.6 g/dL (ref 30.0–36.0)
MCV: 94.5 fl (ref 78.0–100.0)
Monocytes Absolute: 0.6 10*3/uL (ref 0.1–1.0)
Monocytes Relative: 12.3 % — ABNORMAL HIGH (ref 3.0–12.0)
Neutro Abs: 3.2 10*3/uL (ref 1.4–7.7)
Neutrophils Relative %: 61.9 % (ref 43.0–77.0)
Platelets: 135 10*3/uL — ABNORMAL LOW (ref 150.0–400.0)
RBC: 3.93 Mil/uL (ref 3.87–5.11)
RDW: 13.4 % (ref 11.5–15.5)
WBC: 5.1 10*3/uL (ref 4.0–10.5)

## 2019-01-29 LAB — LIPID PANEL
Cholesterol: 168 mg/dL (ref 0–200)
HDL: 80.4 mg/dL (ref >39.00)
LDL Cholesterol: 77 mg/dL (ref 0–99)
NonHDL: 87.36
Total CHOL/HDL Ratio: 2
Triglycerides: 53 mg/dL (ref 0.0–149.0)
VLDL: 10.6 mg/dL (ref 0.0–40.0)

## 2019-01-29 LAB — TSH: TSH: 2.61 u[IU]/mL (ref 0.35–4.50)

## 2019-01-29 NOTE — Patient Instructions (Signed)
Please return in 6 months for follow up of your hypertension. Also please schedule your nurse visit to get your prolia injection in January.   If you have any questions or concerns, please don't hesitate to send me a message via MyChart or call the office at [phone]. Thank you for visiting with [place] today! It's our pleasure caring for you.

## 2019-01-29 NOTE — Telephone Encounter (Signed)
Left message for patient to contact office to schedule her yearly follow up with Dr Gwenlyn Found

## 2019-01-29 NOTE — Progress Notes (Signed)
Subjective  Chief Complaint  Patient presents with  . Annual Exam  . Hypertension  . Hyperlipidemia  . Hypothyroidism    HPI: Robin Harrell is a 85 y.o. female who presents to [facility] today for a Female Wellness Visit. She also has the concerns and/or needs as listed above in the chief complaint. These will be addressed in addition to the Health Maintenance Visit.   Wellness Visit: annual visit with health maintenance review and exam without Pap   HM: up to date. Discussed cessation of breast cancer screening. imms up to date. Remains independent. Is back to driving now. Had AWV recently and I reviewed it again. Chronic disease f/u and/or acute problem visit: (deemed necessary to be done in addition to the wellness visit):  HTN: controlled on bb.   H/o thrombosis and clotting disorder on chronic eloquis no bleeding.   HLD and low thyroid: both due to be rechecked. No sxs of low or high thyroid. No statin SE. She is fasting.   Osteoporosis: on prolia; next due next month. Next dexa 10/2019  Knee OA; intermittent pain and swelling on left. Not active now.   Chronic venous insufficiency with mild leg edema: stable.   SAR: saw pulm for cough and he adjusted her allergy meds; now doing better. Denies sedation with antihistamines  H/o recurrent uti's. No sxs now.  Assessment  1. Essential hypertension   2. Factor V deficiency ([place])   3. Mixed hyperlipidemia   4. Hypothyroidism (acquired)   5. Long term (current) use of anticoagulants   6. Age-related osteoporosis without current pathological fracture   7. Primary osteoarthritis involving multiple joints   8. Seasonal and perennial allergic rhinitis   9. 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 Included topics are cancer screening recommendations, ways to keep healthy (see AVS)  including dietary and exercise recommendations, regular eye and dental care, use of seat belts, and avoidance of moderate alcohol use and tobacco use. Stop breast ca and CRCscreens. Pt agrees.  BMI: discussed patient's BMI and encouraged positive lifestyle modifications to help get to or maintain a target BMI.  HM needs and immunizations were addressed and ordered. See below for orders. See HM and immunization section for updates. utd  Routine labs and screening tests ordered including cmp, cbc and lipids where appropriate.  Discussed recommendations regarding Vit D and calcium supplementation (see AVS)  Chronic disease management visit and/or acute problem visit:  htn is controlled. Check renal function and electrolytes.  HLD and low thyroid: recheck levels today. Clinically euthyroid  OA knee, supportive care; f/u with me if worsens.   Leg edema, venous insufficiency with brawny skin changes. Stable.   Return for prolia injection in January.   Follow up: Return in about 3 months (around 04/29/2019) for follow up Hypertension, recheck.  Orders Placed This Encounter  Procedures  . CBC w/Diff  . CMP  . Lipids  . TSH   No orders of the defined types were placed in this encounter.     Lifestyle: Body mass index is 23.59 kg/m. Wt Readings from Last 3 Encounters:  01/29/19 129 lb (58.5 kg)  10/17/18 130 lb (59 kg)  08/22/18 130 lb (59 kg)     Patient Active Problem List   Diagnosis Date Noted  . Hypothyroidism (acquired) 09/02/2014    Priority: High  Overview:  Started low dose replacemtn 05/2014   . Hyperlipidemia 11/19/2012    Priority: High    hyperlipidemia   . Long term (current) use of anticoagulants 05/10/2012    Priority: High  . Diastolic dysfunction [id number]    Priority: High    Overview:  On beta-blocker, followed by Dr. Ancil Linsey; no h/o CHF   . Factor V deficiency ([place]) 02/09/2010    Priority: High    Overview:  Factor V Deficiency,  lifelong coumadin, followed by coumadin clinic, Dr. Gwenlyn Found   . Osteoporosis 02/09/2010    Priority: High    Overview:  Dexa 08/2017: T = - 3.9 wrist; no significant decrease frome 2017, on Prolia x 2 years. Dexa 07/21/2015: T = - 3.4 left hip ( 33% decrease from -2.4 in 2015); [facility]: T = - 3.4 wrist, T = - 2.4 at hip; prolia, failed fosamax   . Essential hypertension 11/15/2007    Priority: High    Qualifier: Diagnosis of  By: Ronnald Ramp CNA/MA, Janett Billow     . Aortic stenosis, mild 02/07/2017    Priority: Medium    Echo 01/2017: nl wall motion, EF 60%, mild AS (tricuspid), mild MR, moderate diastolic dysfunction   . Chronic idiopathic thrombocytopenia ([facility]) 04/14/2016    Priority: Medium  . Essential tremor 09/03/2015    Priority: Medium  . Edema of right lower extremity 02/01/2013    Priority: Medium    Overview:  Due to prior right TKR: Wears 15-8mmhg thigh high compression stockings.   . Colon polyp 10/15/2012    Priority: Medium    Overview:  Dr. Watt Climes, q 10 year colonoscopy   . Insomnia 10/11/2011    Priority: Medium  . Osteoarthritis, multiple sites 06/30/2011    Priority: Medium    Overview:  Hand and knees.  S/p right knee replacement, Dr. Hillery Aldo   . DJD (degenerative joint disease), lumbar 10/14/2009    Priority: Medium    Overview:  PT; declines surgery. Has been evaluated by Dr. Nelva Bush   . Glaucoma 01/29/2008    Priority: Medium  . History of DVT (deep vein thrombosis) 11/15/2007    Priority: Medium    Qualifier: Diagnosis of  By: Ronnald Ramp CNA/MA, Janett Billow     . GERD 11/15/2007    Priority: Medium    Qualifier: Diagnosis of  By: Ronnald Ramp CNA/MA, Janett Billow     . COUGH, CHRONIC 11/15/2007    Priority: Medium  . History of recurrent UTIs 01/26/2017    Priority: Low  . History of respiratory failure 11/11/2014    Priority: Low    Overview:  Due to RSV PNA 10/2014. Nl Echo.   . Seasonal and perennial allergic rhinitis 11/15/2007     Priority: Low    Qualifier: Diagnosis of  By: Ronnald Ramp CNA/MA, Janett Billow     . 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 Chronic venous stasis dermatitis of both lower extremities 12/31/2018   Health Maintenance  Topic Date Due  . DEXA SCAN  09/30/2019  . INFLUENZA VACCINE  Completed  . PNA vac Low Risk Adult  Completed  . TETANUS/TDAP  Discontinued   Immunization History  Administered Date(s) Administered  . Fluad Quad(high Dose 65+) 10/17/2018  . Influenza Split 11/21/2008, 02/09/2010, 10/24/2011, 11/21/2012  . Influenza Whole 11/27/2008  . Influenza, High Dose Seasonal PF 11/12/2011, 11/29/2012, 11/05/2013, 01/05/2015, 11/16/2015, 10/21/2016, 11/09/2017  . Influenza,inj,Quad PF,6+ Mos 11/23/2015  . Influenza-Unspecified 02/02/2011  . Pneumococcal Conjugate-13 04/23/2013  . Pneumococcal Polysaccharide-23 02/21/2005  .  Tdap 09/22/2007  . Zoster 04/22/2010  . Zoster Recombinat (Shingrix) 07/11/2016, 01/27/2017   We updated and reviewed the patient's past history in detail and it is documented below. Allergies: Patient is allergic to codeine and erythromycin. Past Medical History Patient  has a past medical history of Allergic rhinitis, Cough, Difficulty in swallowing, DVT (deep venous thrombosis) ([place]), Dyslipidemia, Factor V Leiden deficiency, GERD (gastroesophageal reflux disease), History of nuclear stress test (12/28/2007), HTN (hypertension), PND (post-nasal drip), Pulmonary embolism ([place]), PVC's (premature ventricular contractions), and Thyroid disease. Past Surgical History Patient  has a past surgical history that includes Cholecystectomy (1973); history of sleep study (12/27/2007); Bunionectomy; Hammer toe surgery; Replacement total knee (01/11/2002); transthoracic echocardiogram (12/28/2007); Appendectomy; Esophageal dilation; and Cataract extraction w/ intraocular lens implant (Right, 11/02/2013). Family History: Patient family history includes Allergic rhinitis in her  sister; Breast cancer (age of onset: 103) in her daughter; Cirrhosis in her father; Coronary artery disease in her maternal grandfather and paternal grandfather; Heart disease in her paternal grandmother; Lung cancer in her father; Skin cancer in her brother; Throat cancer in her maternal grandfather and paternal grandfather. Social History:  Patient  reports that she quit smoking about 60 years ago. Her smoking use included cigarettes. She quit after 1.00 year of use. She has never used smokeless tobacco. She reports current alcohol use. She reports that she does not use drugs.  Review of Systems: Constitutional: negative for fever or malaise Ophthalmic: negative for photophobia, double vision or loss of vision Cardiovascular: negative for chest pain, dyspnea on exertion, or new LE swelling Respiratory: negative for SOB or persistent cough Gastrointestinal: negative for abdominal pain, change in bowel habits or melena Genitourinary: negative for dysuria or gross hematuria, no abnormal uterine bleeding or disharge Musculoskeletal: negative for new gait disturbance or muscular weakness Integumentary: negative for new or persistent rashes, no breast lumps Neurological: negative for TIA or stroke symptoms Psychiatric: negative for SI or delusions Allergic/Immunologic: negative for hives  Patient Care Team    Relationship Specialty Notifications Start End  [name], MD PCP - General Family Medicine  07/05/12   Collene Gobble, MD Consulting Physician Pulmonary Disease  11/09/17   Lorretta Harp, MD Consulting Physician Cardiology  11/09/17   Harriett Sine, MD Consulting Physician Dermatology  01/30/18   Katy Apo, MD Consulting Physician Ophthalmology  11/23/18   Marzetta Board, DPM Consulting Physician Podiatry  11/23/18     Objective  Vitals: BP 124/72 (BP Location: Left Arm, Patient Position: Sitting, Cuff Size: Normal)   Pulse 80   Temp (!) 97.2 F (36.2 C) (Temporal)    Ht 5\' 2"  (1.575 m)   Wt 129 lb (58.5 kg)   LMP  (LMP Unknown)   SpO2 94%   BMI 23.59 kg/m  General:  Well developed, well nourished, no acute distress , severe kyphosis, ambulates with cane Psych:  Alert and orientedx3,normal mood and affect HEENT:  Normocephalic, atraumatic, non-icteric sclera, PERRL, oropharynx is clear without mass or exudate, supple neck without adenopathy, mass or thyromegaly Cardiovascular:  Normal S1, S2, RRR 3/6 systolic murmur Respiratory:  Good breath sounds bilaterally, CTAB with normal respiratory effort Gastrointestinal: normal bowel sounds, soft, non-tender, no noted masses. No HSM [id number], no contusions. Joints are without erythema or swelling. Spine and CVA region are nontender Skin:  Warm, no rashes or suspicious lesions noted Neurologic:    Mental status is normal. Slow gait, looking down with ambulation    Commons side effects, risks, benefits, and alternatives  for medications and treatment plan prescribed today were discussed, and the patient expressed understanding of the given instructions. Patient is instructed to call or message via MyChart if he/she has any questions or concerns regarding our treatment plan. No barriers to understanding were identified. We discussed Red Flag symptoms and signs in detail. Patient expressed understanding regarding what to do in case of urgent or emergency type symptoms.   Medication list was reconciled, printed and provided to the patient in AVS. Patient instructions and summary information was reviewed with the patient as documented in the AVS. This note was prepared with assistance of Dragon voice recognition software. Occasional wrong-word or sound-a-like substitutions may have occurred due to the inherent limitations of voice recognition software  This visit occurred during the SARS-CoV-2 public health emergency.  Safety protocols were in place, including screening questions prior to the visit, additional  usage of staff PPE, and extensive cleaning of exam room while observing appropriate contact time as indicated for disinfecting solutions.

## 2019-02-05 NOTE — Progress Notes (Signed)
Virtual Visit via Telephone Note  I connected with Robin Harrell on 02/06/19 at  9:00 AM EST by telephone and verified that I am speaking with the correct person using two identifiers.  Location: Patient: Home Provider: [facility] - [id number], [place], [place], [place] [address]   I discussed the limitations, risks, security and privacy concerns of performing an evaluation and management service by telephone and the availability of in person appointments. I also discussed with the patient that there may be a patient responsible charge related to this service. The patient expressed understanding and agreed to proceed.  Patient consented to consult via telephone: Yes People present and their role in pt care: Pt   History of Present Illness:  85 year old female former smoker followed in our office for COPD, chronic rhinitis, chronic cough  Past medical history: History of factor V (managed on chronic Coumadin), history of PE, GERD, hiatal hernia Smoking history: Former smoker. Quit 1960. 1 pack year.  Maintenance: None  Patient of Dr. Lamonte Sakai  Chief complaint: 3 to 4-week televisit follow-up  85 year old female former smoker last seen by our office on 01/16/2019. At that time the televisit centered around patient's chronic rhinitis symptoms. She was encouraged to continue her ipratropium nasal spray as needed. Continue Zyrtec and chlor tabs for now. Asked her to go back on nasal saline rinses twice daily. She was encouraged to consider Mucinex if she starts to have thicker sputum.   Patient completing televisit with our office today.  Overall patient has been doing well.  She reports that she is feeling much better since last talking to Dr. Lamonte Sakai.  Patient reports that she continues to be adherent to Zyrtec, ipratropium nasal spray, chlor tabs.  She does forget sometimes to use her nasal saline rinses.  She has used regular Mucinex as needed for chest congestion.   Overall she is feeling much better.  Observations/Objective:  Social History   Tobacco Use  Smoking Status Former Smoker  . Years: 1.00  . Types: Cigarettes  . Quit date: 02/21/1958  . Years since quitting: 61.0  Smokeless Tobacco Never Used   Immunization History  Administered Date(s) Administered  . Fluad Quad(high Dose 65+) 10/17/2018  . Influenza Split 11/21/2008, 02/09/2010, 10/24/2011, 11/21/2012  . Influenza Whole 11/27/2008  . Influenza, High Dose Seasonal PF 11/12/2011, 11/29/2012, 11/05/2013, 01/05/2015, 11/16/2015, 10/21/2016, 11/09/2017  . Influenza,inj,Quad PF,6+ Mos 11/23/2015  . Influenza-Unspecified 02/02/2011  . Pneumococcal Conjugate-13 04/23/2013  . Pneumococcal Polysaccharide-23 02/21/2005  . Tdap 09/22/2007  . Zoster 04/22/2010  . Zoster Recombinat (Shingrix) 07/11/2016, 01/27/2017      Assessment and Plan:  Seasonal and perennial allergic rhinitis Plan: Continue ipratropium nasal spray 3 times daily Continue nasal saline rinses at least 1-2 times daily prior to ipratropium nasal spray Continue Zyrtec Continue chlor tabs Can use Flonase as needed for nasal congestion Can continue to use regular Mucinex as needed for chest congestion Follow-up with our office in 4 to 6 months  Factor V deficiency ([place]) Plan: Continue Coumadin Continue follow-up with Dr. Alvester Chou Continue to follow-up with Coumadin clinic  Long term (current) use of anticoagulants Plan: Continue Coumadin   Follow Up Instructions:  Return in about 6 months (around 08/07/2019), or if symptoms worsen or fail to improve, for Follow up with Dr. Lamonte Sakai.   I discussed the assessment and treatment plan with the patient. The patient was provided an opportunity to ask questions and all were answered. The patient agreed with the plan  and demonstrated an understanding of the instructions.   The patient was advised to call back or seek an in-person evaluation if the symptoms worsen or if the  condition fails to improve as anticipated.  I provided 22 minutes of non-face-to-face time during this encounter.   Lauraine Rinne, NP

## 2019-02-06 ENCOUNTER — Encounter: Payer: Self-pay | Admitting: Pulmonary Disease

## 2019-02-06 ENCOUNTER — Ambulatory Visit (INDEPENDENT_AMBULATORY_CARE_PROVIDER_SITE_OTHER): Payer: Medicare Other | Admitting: Pulmonary Disease

## 2019-02-06 ENCOUNTER — Other Ambulatory Visit: Payer: Self-pay

## 2019-02-06 DIAGNOSIS — D682 Hereditary deficiency of other clotting factors: Secondary | ICD-10-CM | POA: Diagnosis not present

## 2019-02-06 DIAGNOSIS — J3089 Other allergic rhinitis: Secondary | ICD-10-CM

## 2019-02-06 DIAGNOSIS — Z7901 Long term (current) use of anticoagulants: Secondary | ICD-10-CM | POA: Diagnosis not present

## 2019-02-06 DIAGNOSIS — J302 Other seasonal allergic rhinitis: Secondary | ICD-10-CM

## 2019-02-06 NOTE — Assessment & Plan Note (Signed)
Plan: Continue Coumadin 

## 2019-02-06 NOTE — Assessment & Plan Note (Signed)
Plan: Continue ipratropium nasal spray 3 times daily Continue nasal saline rinses at least 1-2 times daily prior to ipratropium nasal spray Continue Zyrtec Continue chlor tabs Can use Flonase as needed for nasal congestion Can continue to use regular Mucinex as needed for chest congestion Follow-up with our office in 4 to 6 months

## 2019-02-06 NOTE — Assessment & Plan Note (Signed)
Plan: Continue Coumadin Continue follow-up with Dr. Alvester Chou Continue to follow-up with Coumadin clinic

## 2019-02-06 NOTE — Patient Instructions (Addendum)
You were seen today by Lauraine Rinne, NP  for:   1. Seasonal and perennial allergic rhinitis  Continue ipratropium nasal spray 3 times daily Continue nasal saline rinses at least twice daily Continue Zyrtec daily Continue chlor tabs  Can use Flonase 1 spray each nostril twice daily as needed for nasal congestion or allergy-like symptoms  Can use Mucinex as needed for chest congestion or thicker sputum production  2. Long term (current) use of anticoagulants  Continue Coumadin  3. Factor V deficiency ([place])  Continue Coumadin   Follow Up:    Return in about 6 months (around 08/07/2019), or if symptoms worsen or fail to improve, for Follow up with Dr. Lamonte Sakai.   Please do your part to reduce the spread of COVID-19:      Reduce your risk of any infection  and COVID19 by using the similar precautions used for avoiding the common cold or flu:  Marland Kitchen Wash your hands often with soap and warm water for at least 20 seconds.  If soap and water are not readily available, use an alcohol-based hand sanitizer with at least 60% alcohol.  . If coughing or sneezing, cover your mouth and nose by coughing or sneezing into the elbow areas of your shirt or coat, into a tissue or into your sleeve (not your hands). Langley Gauss A MASK when in public  . Avoid shaking hands with others and consider head nods or verbal greetings only. . Avoid touching your eyes, nose, or mouth with unwashed hands.  . Avoid close contact with people who are sick. . Avoid places or events with large numbers of people in one location, like concerts or sporting events. . If you have some symptoms but not all symptoms, continue to monitor at home and seek medical attention if your symptoms worsen. . If you are having a medical emergency, call 911.   [place] / e-Visit: [email or web address]         [facility]:  [facility]: [id number]                   [facility]: [id number]     It is flu season:   >>> Best ways to protect herself from the flu: Receive the yearly flu vaccine, practice good hand hygiene washing with soap and also using hand sanitizer when available, eat a nutritious meals, get adequate rest, hydrate appropriately   Please contact the office if your symptoms worsen or you have concerns that you are not improving.   Thank you for choosing [facility] for your healthcare, and for allowing [place] to partner with you on your healthcare journey. I am thankful to be able to provide care to you today.   Wyn Quaker FNP-C

## 2019-02-07 ENCOUNTER — Other Ambulatory Visit: Payer: Self-pay

## 2019-02-07 ENCOUNTER — Ambulatory Visit (INDEPENDENT_AMBULATORY_CARE_PROVIDER_SITE_OTHER): Payer: Medicare Other | Admitting: Pharmacist

## 2019-02-07 DIAGNOSIS — Z7901 Long term (current) use of anticoagulants: Secondary | ICD-10-CM

## 2019-02-07 LAB — POCT INR: INR: 3.8 — AB (ref 2.0–3.0)

## 2019-02-12 ENCOUNTER — Telehealth: Payer: Self-pay

## 2019-02-12 NOTE — Telephone Encounter (Signed)
Prolia cost is 20% out of pocket for this patient.  Approximately $200.

## 2019-02-19 ENCOUNTER — Telehealth: Payer: Self-pay | Admitting: Emergency Medicine

## 2019-02-19 ENCOUNTER — Ambulatory Visit: Payer: Medicare Other | Admitting: Cardiovascular Disease

## 2019-02-19 MED ORDER — AZITHROMYCIN 250 MG PO TABS: ORAL_TABLET | ORAL | 0 refills | Status: DC

## 2019-02-19 NOTE — Telephone Encounter (Signed)
Please have her start azithromycin, z pack for presumed bronchitis. She needs to call us next week to let us know if she is improving

## 2019-02-19 NOTE — Telephone Encounter (Signed)
Called and spoke to pt. Pt had an appt on 02/06/2019 for a televisit with Wyn Quaker, NP. Pt c/o increase in SOB, malaise, prod cough with green mucus x 4 days. Pt denies known contact with COVID pt, CP/tightness, f/c/s. Pt requesting recs. Pt states she is taking her maintenance medication but has added Mucinex OTC.   Dr. Lamonte Sakai please advise. Thanks.

## 2019-02-19 NOTE — Telephone Encounter (Signed)
Called pt and advised message from the provider. Pt understood and verbalized understanding. Nothing further is needed.    Rx sent in. 

## 2019-02-23 ENCOUNTER — Telehealth: Payer: Self-pay | Admitting: Pulmonary Disease

## 2019-02-23 NOTE — Telephone Encounter (Signed)
Patient called the answering service  She feels she may be allergic to azithromycin Weakness, upset stomach  She has no skin rash No increased shortness of breath No swelling on any part of her body  Continue to monitor symptoms  Call back if any changes Stop taking the antibiotics--last dose was yesterday

## 2019-02-24 ENCOUNTER — Encounter: Payer: Self-pay | Admitting: Family Medicine

## 2019-02-25 ENCOUNTER — Encounter: Payer: Self-pay | Admitting: Family Medicine

## 2019-02-25 ENCOUNTER — Emergency Department (HOSPITAL_BASED_OUTPATIENT_CLINIC_OR_DEPARTMENT_OTHER): Payer: Medicare Other

## 2019-02-25 ENCOUNTER — Inpatient Hospital Stay (HOSPITAL_BASED_OUTPATIENT_CLINIC_OR_DEPARTMENT_OTHER)
Admission: EM | Admit: 2019-02-25 | Discharge: 2019-03-04 | DRG: 177 | Disposition: A | Discharge status: Skilled Nursing Facility | Payer: Medicare Other | Attending: Internal Medicine | Admitting: Internal Medicine

## 2019-02-25 ENCOUNTER — Encounter (HOSPITAL_BASED_OUTPATIENT_CLINIC_OR_DEPARTMENT_OTHER): Payer: Self-pay | Admitting: Internal Medicine

## 2019-02-25 ENCOUNTER — Encounter (HOSPITAL_BASED_OUTPATIENT_CLINIC_OR_DEPARTMENT_OTHER): Payer: Self-pay | Admitting: *Deleted

## 2019-02-25 ENCOUNTER — Other Ambulatory Visit: Payer: Self-pay

## 2019-02-25 ENCOUNTER — Ambulatory Visit (INDEPENDENT_AMBULATORY_CARE_PROVIDER_SITE_OTHER): Payer: Medicare Other | Admitting: Family Medicine

## 2019-02-25 VITALS — Ht 62.0 in | Wt 129.0 lb

## 2019-02-25 DIAGNOSIS — Z8249 Family history of ischemic heart disease and other diseases of the circulatory system: Secondary | ICD-10-CM

## 2019-02-25 DIAGNOSIS — J9600 Acute respiratory failure, unspecified whether with hypoxia or hypercapnia: Secondary | ICD-10-CM | POA: Diagnosis present

## 2019-02-25 DIAGNOSIS — R498 Other voice and resonance disorders: Secondary | ICD-10-CM | POA: Diagnosis not present

## 2019-02-25 DIAGNOSIS — E785 Hyperlipidemia, unspecified: Secondary | ICD-10-CM | POA: Diagnosis present

## 2019-02-25 DIAGNOSIS — E039 Hypothyroidism, unspecified: Secondary | ICD-10-CM | POA: Diagnosis present

## 2019-02-25 DIAGNOSIS — Z96651 Presence of right artificial knee joint: Secondary | ICD-10-CM | POA: Diagnosis present

## 2019-02-25 DIAGNOSIS — M47816 Spondylosis without myelopathy or radiculopathy, lumbar region: Secondary | ICD-10-CM | POA: Diagnosis present

## 2019-02-25 DIAGNOSIS — Z803 Family history of malignant neoplasm of breast: Secondary | ICD-10-CM

## 2019-02-25 DIAGNOSIS — U071 COVID-19: Secondary | ICD-10-CM | POA: Diagnosis present

## 2019-02-25 DIAGNOSIS — Z79899 Other long term (current) drug therapy: Secondary | ICD-10-CM | POA: Diagnosis not present

## 2019-02-25 DIAGNOSIS — I110 Hypertensive heart disease with heart failure: Secondary | ICD-10-CM | POA: Diagnosis present

## 2019-02-25 DIAGNOSIS — Z801 Family history of malignant neoplasm of trachea, bronchus and lung: Secondary | ICD-10-CM

## 2019-02-25 DIAGNOSIS — E871 Hypo-osmolality and hyponatremia: Secondary | ICD-10-CM | POA: Diagnosis present

## 2019-02-25 DIAGNOSIS — M159 Polyosteoarthritis, unspecified: Secondary | ICD-10-CM | POA: Diagnosis present

## 2019-02-25 DIAGNOSIS — R0602 Shortness of breath: Secondary | ICD-10-CM | POA: Diagnosis not present

## 2019-02-25 DIAGNOSIS — R531 Weakness: Secondary | ICD-10-CM | POA: Diagnosis not present

## 2019-02-25 DIAGNOSIS — Z86718 Personal history of other venous thrombosis and embolism: Secondary | ICD-10-CM | POA: Diagnosis not present

## 2019-02-25 DIAGNOSIS — Z20822 Contact with and (suspected) exposure to covid-19: Secondary | ICD-10-CM

## 2019-02-25 DIAGNOSIS — M6281 Muscle weakness (generalized): Secondary | ICD-10-CM | POA: Diagnosis not present

## 2019-02-25 DIAGNOSIS — J1282 Pneumonia due to coronavirus disease 2019: Secondary | ICD-10-CM | POA: Diagnosis present

## 2019-02-25 DIAGNOSIS — Z79890 Hormone replacement therapy (postmenopausal): Secondary | ICD-10-CM

## 2019-02-25 DIAGNOSIS — Z7901 Long term (current) use of anticoagulants: Secondary | ICD-10-CM | POA: Diagnosis not present

## 2019-02-25 DIAGNOSIS — Z86711 Personal history of pulmonary embolism: Secondary | ICD-10-CM | POA: Diagnosis not present

## 2019-02-25 DIAGNOSIS — R000 Tachycardia, unspecified: Secondary | ICD-10-CM | POA: Diagnosis not present

## 2019-02-25 DIAGNOSIS — I5032 Chronic diastolic (congestive) heart failure: Secondary | ICD-10-CM | POA: Diagnosis present

## 2019-02-25 DIAGNOSIS — Z885 Allergy status to narcotic agent status: Secondary | ICD-10-CM

## 2019-02-25 DIAGNOSIS — D682 Hereditary deficiency of other clotting factors: Secondary | ICD-10-CM | POA: Diagnosis present

## 2019-02-25 DIAGNOSIS — I10 Essential (primary) hypertension: Secondary | ICD-10-CM | POA: Diagnosis present

## 2019-02-25 DIAGNOSIS — Z881 Allergy status to other antibiotic agents status: Secondary | ICD-10-CM

## 2019-02-25 DIAGNOSIS — D693 Immune thrombocytopenic purpura: Secondary | ICD-10-CM | POA: Diagnosis present

## 2019-02-25 DIAGNOSIS — Z808 Family history of malignant neoplasm of other organs or systems: Secondary | ICD-10-CM

## 2019-02-25 DIAGNOSIS — D6851 Activated protein C resistance: Secondary | ICD-10-CM | POA: Diagnosis present

## 2019-02-25 DIAGNOSIS — Z87891 Personal history of nicotine dependence: Secondary | ICD-10-CM | POA: Diagnosis not present

## 2019-02-25 DIAGNOSIS — J9601 Acute respiratory failure with hypoxia: Secondary | ICD-10-CM | POA: Diagnosis not present

## 2019-02-25 DIAGNOSIS — Z743 Need for continuous supervision: Secondary | ICD-10-CM | POA: Diagnosis not present

## 2019-02-25 DIAGNOSIS — K219 Gastro-esophageal reflux disease without esophagitis: Secondary | ICD-10-CM | POA: Diagnosis present

## 2019-02-25 DIAGNOSIS — I5189 Other ill-defined heart diseases: Secondary | ICD-10-CM

## 2019-02-25 DIAGNOSIS — E860 Dehydration: Secondary | ICD-10-CM | POA: Diagnosis present

## 2019-02-25 DIAGNOSIS — R2681 Unsteadiness on feet: Secondary | ICD-10-CM | POA: Diagnosis not present

## 2019-02-25 DIAGNOSIS — R52 Pain, unspecified: Secondary | ICD-10-CM | POA: Diagnosis not present

## 2019-02-25 DIAGNOSIS — R279 Unspecified lack of coordination: Secondary | ICD-10-CM | POA: Diagnosis not present

## 2019-02-25 DIAGNOSIS — R2689 Other abnormalities of gait and mobility: Secondary | ICD-10-CM | POA: Diagnosis not present

## 2019-02-25 DIAGNOSIS — R5381 Other malaise: Secondary | ICD-10-CM | POA: Diagnosis not present

## 2019-02-25 DIAGNOSIS — R1312 Dysphagia, oropharyngeal phase: Secondary | ICD-10-CM | POA: Diagnosis not present

## 2019-02-25 LAB — CBC WITH DIFFERENTIAL/PLATELET
Abs Immature Granulocytes: 0.04 10*3/uL (ref 0.00–0.07)
Basophils Absolute: 0 10*3/uL (ref 0.0–0.1)
Basophils Relative: 0 %
Eosinophils Absolute: 0 10*3/uL (ref 0.0–0.5)
Eosinophils Relative: 0 %
HCT: 35.5 % — ABNORMAL LOW (ref 36.0–46.0)
Hemoglobin: 11.8 g/dL — ABNORMAL LOW (ref 12.0–15.0)
Immature Granulocytes: 1 %
Lymphocytes Relative: 8 %
Lymphs Abs: 0.6 10*3/uL — ABNORMAL LOW (ref 0.7–4.0)
MCH: 30.3 pg (ref 26.0–34.0)
MCHC: 33.2 g/dL (ref 30.0–36.0)
MCV: 91.3 fL (ref 80.0–100.0)
Monocytes Absolute: 0.5 10*3/uL (ref 0.1–1.0)
Monocytes Relative: 7 %
Neutro Abs: 6.4 10*3/uL (ref 1.7–7.7)
Neutrophils Relative %: 84 %
Platelets: 170 10*3/uL (ref 150–400)
RBC: 3.89 MIL/uL (ref 3.87–5.11)
RDW: 12.6 % (ref 11.5–15.5)
WBC: 7.5 10*3/uL (ref 4.0–10.5)
nRBC: 0 % (ref 0.0–0.2)

## 2019-02-25 LAB — PROTIME-INR
INR: 3.6 — ABNORMAL HIGH (ref 0.8–1.2)
Prothrombin Time: 36.1 seconds — ABNORMAL HIGH (ref 11.4–15.2)

## 2019-02-25 LAB — COMPREHENSIVE METABOLIC PANEL
ALT: 17 U/L (ref 0–44)
AST: 33 U/L (ref 15–41)
Albumin: 3.4 g/dL — ABNORMAL LOW (ref 3.5–5.0)
Alkaline Phosphatase: 59 U/L (ref 38–126)
Anion gap: 12 (ref 5–15)
BUN: 16 mg/dL (ref 8–23)
CO2: 22 mmol/L (ref 22–32)
Calcium: 8.5 mg/dL — ABNORMAL LOW (ref 8.9–10.3)
Chloride: 95 mmol/L — ABNORMAL LOW (ref 98–111)
Creatinine, Ser: 0.87 mg/dL (ref 0.44–1.00)
GFR calc Af Amer: >60 mL/min (ref >60)
GFR calc non Af Amer: >60 mL/min (ref >60)
Glucose, Bld: 152 mg/dL — ABNORMAL HIGH (ref 70–99)
Potassium: 4 mmol/L (ref 3.5–5.1)
Sodium: 129 mmol/L — ABNORMAL LOW (ref 135–145)
Total Bilirubin: 1.1 mg/dL (ref 0.3–1.2)
Total Protein: 7.3 g/dL (ref 6.5–8.1)

## 2019-02-25 LAB — LACTIC ACID, PLASMA
Lactic Acid, Venous: 1.2 mmol/L (ref 0.5–1.9)
Lactic Acid, Venous: 1.1 mmol/L (ref 0.5–1.9)

## 2019-02-25 LAB — FERRITIN: Ferritin: 550 ng/mL — ABNORMAL HIGH (ref 11–307)

## 2019-02-25 LAB — LACTATE DEHYDROGENASE: LDH: 334 U/L — ABNORMAL HIGH (ref 98–192)

## 2019-02-25 LAB — D-DIMER, QUANTITATIVE: D-Dimer, Quant: 1.05 ug/mL-FEU — ABNORMAL HIGH (ref 0.00–0.50)

## 2019-02-25 LAB — C-REACTIVE PROTEIN: CRP: 23.9 mg/dL — ABNORMAL HIGH (ref <1.0)

## 2019-02-25 LAB — FIBRINOGEN: Fibrinogen: 777 mg/dL — ABNORMAL HIGH (ref 210–475)

## 2019-02-25 LAB — TRIGLYCERIDES: Triglycerides: 60 mg/dL (ref <150)

## 2019-02-25 LAB — SARS CORONAVIRUS 2 AG (30 MIN TAT): SARS Coronavirus 2 Ag: POSITIVE — AB

## 2019-02-25 MED ORDER — METOPROLOL TARTRATE 25 MG PO TABS
25.0000 mg | ORAL_TABLET | Freq: Two times a day (BID) | ORAL | Status: DC
  Administered 2019-02-26 – 2019-03-04 (×12): 25 mg via ORAL
  Filled 2019-02-25 (×13): qty 1

## 2019-02-25 MED ORDER — SODIUM CHLORIDE 0.9 % IV SOLN
200.0000 mg | Freq: Once | INTRAVENOUS | Status: DC
  Filled 2019-02-25: qty 40

## 2019-02-25 MED ORDER — IOHEXOL 350 MG/ML SOLN
100.0000 mL | Freq: Once | INTRAVENOUS | Status: AC | PRN
  Administered 2019-02-25: 100 mL via INTRAVENOUS

## 2019-02-25 MED ORDER — ATORVASTATIN CALCIUM 10 MG PO TABS
10.0000 mg | ORAL_TABLET | Freq: Every day | ORAL | Status: DC
  Administered 2019-02-26 – 2019-03-03 (×6): 10 mg via ORAL
  Filled 2019-02-25 (×5): qty 1
  Filled 2019-02-25: qty 2
  Filled 2019-02-25 (×2): qty 1

## 2019-02-25 MED ORDER — LEVOTHYROXINE SODIUM 50 MCG PO TABS
25.0000 ug | ORAL_TABLET | Freq: Every day | ORAL | Status: DC
  Administered 2019-02-26 – 2019-03-04 (×7): 25 ug via ORAL
  Filled 2019-02-25 (×8): qty 1

## 2019-02-25 MED ORDER — SODIUM CHLORIDE 0.9 % IV SOLN
INTRAVENOUS | Status: AC
  Filled 2019-02-25 (×2): qty 20

## 2019-02-25 MED ORDER — LATANOPROST 0.005 % OP SOLN
1.0000 [drp] | Freq: Every day | OPHTHALMIC | Status: DC
  Administered 2019-02-26 – 2019-03-03 (×6): 1 [drp] via OPHTHALMIC
  Filled 2019-02-25 (×4): qty 2.5

## 2019-02-25 MED ORDER — SODIUM CHLORIDE 0.9 % IV SOLN
INTRAVENOUS | Status: DC | PRN
  Administered 2019-02-25: 500 mL via INTRAVENOUS

## 2019-02-25 MED ORDER — LOSARTAN POTASSIUM 25 MG PO TABS
50.0000 mg | ORAL_TABLET | Freq: Every day | ORAL | Status: DC
  Administered 2019-02-26 – 2019-03-04 (×7): 50 mg via ORAL
  Filled 2019-02-25 (×7): qty 2

## 2019-02-25 MED ORDER — DEXAMETHASONE SODIUM PHOSPHATE 10 MG/ML IJ SOLN
6.0000 mg | INTRAMUSCULAR | Status: DC
  Administered 2019-02-25 – 2019-02-26 (×2): 6 mg via INTRAVENOUS
  Filled 2019-02-25 (×2): qty 1

## 2019-02-25 MED ORDER — LORATADINE 10 MG PO TABS
10.0000 mg | ORAL_TABLET | Freq: Every day | ORAL | Status: DC
  Administered 2019-02-26 – 2019-03-04 (×7): 10 mg via ORAL
  Filled 2019-02-25 (×7): qty 1

## 2019-02-25 MED ORDER — SODIUM CHLORIDE 0.9 % IV SOLN: 100.0000 mg | Freq: Every day | INTRAVENOUS | Status: DC

## 2019-02-25 MED ORDER — BRIMONIDINE TARTRATE 0.15 % OP SOLN
1.0000 [drp] | Freq: Two times a day (BID) | OPHTHALMIC | Status: DC
  Filled 2019-02-25: qty 5

## 2019-02-25 NOTE — ED Notes (Signed)
Patient transported to CT 

## 2019-02-25 NOTE — ED Provider Notes (Signed)
[facility] EMERGENCY DEPARTMENT Provider Note   CSN: [id number] Arrival date & time: 02/25/19  1523     History Chief Complaint  Patient presents with  . Cough    + covid    Robin Harrell is a 86 y.o. female.  The history is provided by the patient.  Cough Associated symptoms: fever   Associated symptoms: no chest pain and no rash   Patient is had a cough and weakness over the last week.  States she had been on azithromycin by her pulmonologist and that because the azithromycin has erythromycin in it she developed nausea and vomiting.  States she still feels bad.  Has had cough with some sputum production.  Has been coughing up brownish sputum.  Generalized weakness.  More fatigued.  She is on Coumadin for previous pulmonary embolisms from factor V Leiden.  States she has had chills and muscle aches.  No known Covid contacts.     Past Medical History:  Diagnosis Date  . Allergic rhinitis   . Cough   . Difficulty in swallowing    w/ occasional aspiration  . DVT (deep venous thrombosis) ([place])   . Dyslipidemia   . Factor V Leiden deficiency    lifelong coumadin  . GERD (gastroesophageal reflux disease)   . History of nuclear stress test 12/28/2007   lexiscan; low risk   . HTN (hypertension)   . PND (post-nasal drip)   . Pulmonary embolism ([place])   . PVC's (premature ventricular contractions)   . Thyroid disease     Patient Active Problem List   Diagnosis Date Noted  . 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 Chronic venous stasis dermatitis of both lower extremities 12/31/2018  . Aortic stenosis, mild 02/07/2017  . History of recurrent UTIs 01/26/2017  . Chronic idiopathic thrombocytopenia ([place]) 04/14/2016  . Essential tremor 09/03/2015  . History of respiratory failure 11/11/2014  . Hypothyroidism (acquired) 09/02/2014  . Edema of right lower extremity 02/01/2013  . Hyperlipidemia 11/19/2012  . Colon polyp 10/15/2012  . Long term (current) use of  anticoagulants 05/10/2012  . Insomnia 10/11/2011  . Osteoarthritis, multiple sites 06/30/2011  . Diastolic dysfunction 123XX123  . Factor V deficiency ([place]) 02/09/2010  . Osteoporosis 02/09/2010  . DJD (degenerative joint disease), lumbar 10/14/2009  . Glaucoma 01/29/2008  . Essential hypertension 11/15/2007  . History of DVT (deep vein thrombosis) 11/15/2007  . Seasonal and perennial allergic rhinitis 11/15/2007  . GERD 11/15/2007  . COUGH, CHRONIC 11/15/2007    Past Surgical History:  Procedure Laterality Date  . APPENDECTOMY    . BUNIONECTOMY    . CATARACT EXTRACTION W/ INTRAOCULAR LENS IMPLANT Right 11/02/2013  . CHOLECYSTECTOMY  1973  . ESOPHAGEAL DILATION    . HAMMER TOE SURGERY    . history of sleep study  12/27/2007   AHI during total sleep 0.9/hr and REM 1.5/hr; RDI during total sleep6.0/hr and REM 6.1/hr  . 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 TRANSTHORACIC ECHOCARDIOGRAM  12/28/2007   borderline conc LVH with normal systolic function; MV mod thickened with mild MVP & mild MR     OB History   No obstetric history on file.     Family History  Problem Relation Age of Onset  . Lung cancer Father   . Cirrhosis Father   . Coronary artery disease Maternal Grandfather        MI  . Throat cancer Maternal Grandfather   . Heart disease Paternal Grandmother   .  Coronary artery disease Paternal Grandfather        MI  . Throat cancer Paternal Grandfather   . Allergic rhinitis Sister   . Skin cancer Brother   . Breast cancer Daughter 30    Social History   Tobacco Use  . Smoking status: Former Smoker    Years: 1.00    Types: Cigarettes    Quit date: 02/21/1958    Years since quitting: 61.0  . Smokeless tobacco: Never Used  Substance Use Topics  . Alcohol use: Yes    Comment: rareley one glass of wine  . Drug use: No    Home Medications Prior to Admission medications   Medication Sig Start Date End Date Taking? Authorizing Provider    acetaminophen (TYLENOL) 500 MG tablet Take 1,000 mg by mouth every 6 (six) hours as needed for moderate pain.    [provider]  atorvastatin (LIPITOR) 10 MG tablet TAKE 1 TABLET (10 MG TOTAL) BY MOUTH DAILY AT 6 PM. 09/20/18   Lorretta Harp, MD  azithromycin (ZITHROMAX) 250 MG tablet Take 2 pills today then one a day for 4 additional days. DX J40 Patient not taking: Reported on 02/25/2019 02/19/19   Collene Gobble, MD  brimonidine (ALPHAGAN) 0.15 % ophthalmic solution Place 1 drop into both eyes 2 (two) times daily. 05/28/17   [provider]  Calcium-Phosphorus-Vitamin D (CALCIUM/D3 ADULT GUMMIES) 200-96.6-200 MG-MG-UNIT CHEW Chew 2 tablets by mouth daily.    [provider]  cetirizine (ZYRTEC) 10 MG tablet TAKE 1 TABLET BY MOUTH EVERY DAY 04/09/18   Collene Gobble, MD  chlorpheniramine (CHLOR-TRIMETON) 4 MG tablet Take 4 mg by mouth every morning.     [provider]  fluticasone (FLONASE) 50 MCG/ACT nasal spray Place 2 sprays into both nostrils daily. 01/16/19   Collene Gobble, MD  ipratropium (ATROVENT) 0.03 % nasal spray PLACE 2 SPRAYS INTO BOTH NOSTRILS 3 (THREE) TIMES DAILY AS NEEDED FOR RHINITIS. 09/11/18   Collene Gobble, MD  latanoprost (XALATAN) 0.005 % ophthalmic solution Place 1 drop into both eyes at bedtime.  10/08/10   [provider]  levothyroxine (SYNTHROID, LEVOTHROID) 25 MCG tablet TAKE 1 TABLET BY MOUTH EVERY DAY Patient taking differently: Take 25 mcg by mouth daily before breakfast.  05/03/18   [name], MD  losartan (COZAAR) 50 MG tablet Take 1 tablet (50 mg total) by mouth daily. 09/20/18   [name], MD  metoprolol tartrate (LOPRESSOR) 25 MG tablet TAKE 1 TABLET (25 MG TOTAL) BY MOUTH 2 (TWO) TIMES DAILY. *NEEDS OFFICE VISIT FOR FURTHER REFILLS* 12/18/18   Lorretta Harp, MD  multivitamin-iron-minerals-folic acid (CENTRUM) chewable tablet Chew 1 tablet by mouth daily.    [provider]  NON FORMULARY  Peripheral neuropathy cream from [place], Historical, MD  vitamin E (VITAMIN E) 1000 UNIT capsule Take 1,000 Units by mouth daily.    [provider]  warfarin (COUMADIN) 7.5 MG tablet TAKE 1/2 TO 1 TABLET BY MOUTH DAILY AS DIRECTED 09/26/18   Lorretta Harp, MD    Allergies    Codeine and Erythromycin  Review of Systems   Review of Systems  Constitutional: Positive for appetite change, fatigue and fever.  HENT: Positive for congestion.   Respiratory: Positive for cough.   Cardiovascular: Negative for chest pain.  Gastrointestinal: Negative for abdominal pain.  Genitourinary: Negative for flank pain.  Musculoskeletal: Negative for back pain.  Skin: Negative for rash.  Neurological: Positive for weakness.  Psychiatric/Behavioral: Negative for confusion.    Physical Exam Updated Vital Signs BP 125/84 (BP Location: Right Arm)   Pulse (!) 56   Temp 99.2 F (37.3 C) (Oral)   Resp 18   LMP  (LMP Unknown)   SpO2 98%   Physical Exam Vitals and nursing note reviewed.  HENT:     Head: Normocephalic.  Eyes:     Pupils: Pupils are equal, round, and reactive to light.  Cardiovascular:     Rate and Rhythm: Regular rhythm.  Pulmonary:     Comments: Mildly harsh breath sounds.  Mild tachypnea. Chest:     Chest wall: No tenderness.  Abdominal:     Tenderness: There is no abdominal tenderness.  Musculoskeletal:     Cervical back: Neck supple.     Right lower leg: No edema.     Left lower leg: No edema.  Skin:    General: Skin is warm.     Capillary Refill: Capillary refill takes less than 2 seconds.  Neurological:     Mental Status: She is alert and oriented to person, place, and time. Mental status is at baseline.     ED Results / Procedures / Treatments   Labs (all labs ordered are listed, but only abnormal results are displayed) Labs Reviewed  SARS CORONAVIRUS 2 AG (30 MIN TAT) - Abnormal; Notable for the following components:       Result Value   SARS Coronavirus 2 Ag POSITIVE (*)    All other components within normal limits  CBC WITH DIFFERENTIAL/PLATELET - Abnormal; Notable for the following components:   Hemoglobin 11.8 (*)    HCT 35.5 (*)    Lymphs Abs 0.6 (*)    All other components within normal limits  COMPREHENSIVE METABOLIC PANEL - Abnormal; Notable for the following components:   Sodium 129 (*)    Chloride 95 (*)    Glucose, Bld 152 (*)    Calcium 8.5 (*)    Albumin 3.4 (*)    All other components within normal limits  CULTURE, BLOOD (ROUTINE X 2)  CULTURE, BLOOD (ROUTINE X 2)  LACTIC ACID, PLASMA  LACTIC ACID, PLASMA  D-DIMER, QUANTITATIVE (NOT AT [facility])  PROCALCITONIN  LACTATE DEHYDROGENASE  FERRITIN  TRIGLYCERIDES  FIBRINOGEN  C-REACTIVE PROTEIN  PROTIME-INR    EKG None  Radiology No results found.  Procedures Procedures (including critical care time)  Medications Ordered in ED Medications - No data to display  ED Course  I have reviewed the triage vital signs and the nursing notes.  Pertinent labs & imaging results that were available during my care of the patient were reviewed by me and considered in my medical decision making (see chart for details).    MDM Rules/Calculators/A&P                      Patient with shortness of breath.  Sats will go down into the 80s however most the time that you stay up in the 90s.  Does have tachypnea particular with coughing.  X-ray potentially showed a small amount of infiltrate.  However also had positive Covid test.  Will add CT scan for PE with history of her PEs.  However with desaturations will require admission the hospital will discuss with hospitalist.  CT scan done showed just Covid pneumonia.  The desaturations will treat with remdesivir and steroids. Final Clinical Impression(s) / ED Diagnoses Final diagnoses:  [id number]    Rx /  DC Orders ED Discharge Orders    None       Davonna Belling, MD 02/25/19 2154

## 2019-02-25 NOTE — ED Notes (Signed)
Surgical mask given. Patient asking to lay down.

## 2019-02-25 NOTE — ED Triage Notes (Signed)
C/o cough/ weakness x 1 week  , sent here by PMD for covid test

## 2019-02-25 NOTE — Telephone Encounter (Signed)
Please advise 

## 2019-02-25 NOTE — Progress Notes (Signed)
Virtual Visit via Video Note  Subjective  CC:  Chief Complaint  Patient presents with  . Memory loss  . Fatigue    low energy     I connected with Robin Harrell on 02/25/19 at  1:00 PM EST by a video enabled telemedicine application and verified that I am speaking with the correct person using two identifiers. Location patient: Home Location provider: [facility], Office Persons participating in the virtual visit: Robin Harrell, [name], MD Serita Sheller, [place] discussed the limitations of evaluation and management by telemedicine and the availability of in person appointments. The patient expressed understanding and agreed to proceed. HPI: Robin Harrell is a 86 y.o. female who was contacted today to address the problems listed above in the chief complaint. . 86 yo typically independent female w/ > 1 weeks of malaise, myalgias, productive cough, fatigue and generalized weakness so much so that can't fulfill ADLs. Daughter is with her: coughing and will "breath hard" if gets up. Denies fever. Pt called her pulmonologist (manages chronic cough w/o h/o lung disease) last week who started a zpak. Pt reports it "made her sicker" but with further discussion it sounds like her symptoms are progressing. She hasn't taken the zpak for days w/o improvement. Little appetite and some nausea and retching yesterday. Denies urinary sxs.   Assessment  1. Suspected COVID-19 virus infection   2. Generalized weakness      Plan   Cough and malaise and weakness:  Rec ER visit now; needs evaluation with exam, labs, covid testing. Pt and daughter agree.  I discussed the assessment and treatment plan with the patient. The patient was provided an opportunity to ask questions and all were answered. The patient agreed with the plan and demonstrated an understanding of the instructions.   The patient was advised to call back or seek an in-person evaluation if  the symptoms worsen or if the condition fails to improve as anticipated. Follow up: No follow-ups on file.  02/28/2019  No orders of the defined types were placed in this encounter.     I reviewed the patients updated PMH, FH, and SocHx.    Patient Active Problem List   Diagnosis Date Noted  . Hypothyroidism (acquired) 09/02/2014    Priority: High  . Hyperlipidemia 11/19/2012    Priority: High  . Long term (current) use of anticoagulants 05/10/2012    Priority: High  . Diastolic dysfunction 123XX123    Priority: High  . Factor V deficiency ([place]) 02/09/2010    Priority: High  . Osteoporosis 02/09/2010    Priority: High  . Essential hypertension 11/15/2007    Priority: High  . Chronic venous stasis dermatitis of both lower extremities 12/31/2018    Priority: Medium  . Aortic stenosis, mild 02/07/2017    Priority: Medium  . Chronic idiopathic thrombocytopenia ([facility]) 04/14/2016    Priority: Medium  . Essential tremor 09/03/2015    Priority: Medium  . Edema of right lower extremity 02/01/2013    Priority: Medium  . Colon polyp 10/15/2012    Priority: Medium  . Insomnia 10/11/2011    Priority: Medium  . Osteoarthritis, multiple sites 06/30/2011    Priority: Medium  . DJD (degenerative joint disease), lumbar 10/14/2009    Priority: Medium  . Glaucoma 01/29/2008    Priority: Medium  . History of DVT (deep vein thrombosis) 11/15/2007    Priority: Medium  . GERD 11/15/2007  Priority: Medium  . COUGH, CHRONIC 11/15/2007    Priority: Medium  . 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 History of recurrent UTIs 01/26/2017    Priority: Low  . History of respiratory failure 11/11/2014    Priority: Low  . Seasonal and perennial allergic rhinitis 11/15/2007    Priority: Low   Current Meds  Medication Sig  . acetaminophen (TYLENOL) 500 MG tablet Take 1,000 mg by mouth every 6 (six) hours as needed for moderate pain.  Marland Kitchen atorvastatin (LIPITOR) 10 MG  tablet TAKE 1 TABLET (10 MG TOTAL) BY MOUTH DAILY AT 6 PM.  . brimonidine (ALPHAGAN) 0.15 % ophthalmic solution Place 1 drop into both eyes 2 (two) times daily.  . Calcium-Phosphorus-Vitamin D (CALCIUM/D3 ADULT GUMMIES) 200-96.6-200 MG-MG-UNIT CHEW Chew 2 tablets by mouth daily.  . cetirizine (ZYRTEC) 10 MG tablet TAKE 1 TABLET BY MOUTH EVERY DAY  . chlorpheniramine (CHLOR-TRIMETON) 4 MG tablet Take 4 mg by mouth every morning.   . fluticasone (FLONASE) 50 MCG/ACT nasal spray Place 2 sprays into both nostrils daily.  Marland Kitchen ipratropium (ATROVENT) 0.03 % nasal spray PLACE 2 SPRAYS INTO BOTH NOSTRILS 3 (THREE) TIMES DAILY AS NEEDED FOR RHINITIS.  Marland Kitchen latanoprost (XALATAN) 0.005 % ophthalmic solution Place 1 drop into both eyes at bedtime.   Marland Kitchen levothyroxine (SYNTHROID, LEVOTHROID) 25 MCG tablet TAKE 1 TABLET BY MOUTH EVERY DAY (Patient taking differently: Take 25 mcg by mouth daily before breakfast. )  . losartan (COZAAR) 50 MG tablet Take 1 tablet (50 mg total) by mouth daily.  . metoprolol tartrate (LOPRESSOR) 25 MG tablet TAKE 1 TABLET (25 MG TOTAL) BY MOUTH 2 (TWO) TIMES DAILY. *NEEDS OFFICE VISIT FOR FURTHER REFILLS*  . multivitamin-iron-minerals-folic acid (CENTRUM) chewable tablet Chew 1 tablet by mouth daily.  . vitamin E (VITAMIN E) 1000 UNIT capsule Take 1,000 Units by mouth daily.  Marland Kitchen warfarin (COUMADIN) 7.5 MG tablet TAKE 1/2 TO 1 TABLET BY MOUTH DAILY AS DIRECTED    Allergies: Patient is allergic to codeine and erythromycin. Family History: Patient family history includes Allergic rhinitis in her sister; Breast cancer (age of onset: 66) in her daughter; Cirrhosis in her father; Coronary artery disease in her maternal grandfather and paternal grandfather; Heart disease in her paternal grandmother; Lung cancer in her father; Skin cancer in her brother; Throat cancer in her maternal grandfather and paternal grandfather. Social History:  Patient  reports that she quit smoking about 61 years ago.  Her smoking use included cigarettes. She quit after 1.00 year of use. She has never used smokeless tobacco. She reports current alcohol use. She reports that she does not use drugs.  Review of Systems: Constitutional: +for fever malaise or anorexia Cardiovascular: negative for chest pain Respiratory: +for SOB or persistent cough Gastrointestinal: negative for abdominal pain  OBJECTIVE Vitals: Ht 5\' 2"  (1.575 m)   Wt 129 lb (58.5 kg)   LMP  (LMP Unknown)   BMI 23.59 kg/m  General: appears weak, coughing, A&Ox3  [name], MD

## 2019-02-26 DIAGNOSIS — J1282 Pneumonia due to coronavirus disease 2019: Secondary | ICD-10-CM

## 2019-02-26 DIAGNOSIS — U071 COVID-19: Secondary | ICD-10-CM | POA: Diagnosis present

## 2019-02-26 DIAGNOSIS — J069 Acute upper respiratory infection, unspecified: Secondary | ICD-10-CM | POA: Insufficient documentation

## 2019-02-26 HISTORY — DX: Pneumonia due to coronavirus disease 2019: J12.82

## 2019-02-26 HISTORY — DX: COVID-19: U07.1

## 2019-02-26 LAB — PROTIME-INR
INR: 4.1 (ref 0.8–1.2)
Prothrombin Time: 39.8 seconds — ABNORMAL HIGH (ref 11.4–15.2)

## 2019-02-26 LAB — PROCALCITONIN: Procalcitonin: <0.1 ng/mL

## 2019-02-26 MED ORDER — SODIUM CHLORIDE 0.9% FLUSH
3.0000 mL | Freq: Two times a day (BID) | INTRAVENOUS | Status: DC
  Administered 2019-02-26 – 2019-02-27 (×3): 3 mL via INTRAVENOUS

## 2019-02-26 MED ORDER — SODIUM CHLORIDE 0.9 % IV SOLN
100.0000 mg | Freq: Every day | INTRAVENOUS | Status: AC
  Administered 2019-02-27 – 2019-03-02 (×4): 100 mg via INTRAVENOUS
  Filled 2019-02-26 (×4): qty 20

## 2019-02-26 MED ORDER — GUAIFENESIN-DM 100-10 MG/5ML PO SYRP: 10.0000 mL | ORAL_SOLUTION | ORAL | Status: DC | PRN

## 2019-02-26 MED ORDER — WARFARIN - PHARMACIST DOSING INPATIENT
Freq: Every day | Status: DC
  Filled 2019-02-26: qty 1

## 2019-02-26 MED ORDER — SODIUM CHLORIDE 0.9 % IV SOLN: 100.0000 mg | Freq: Every day | INTRAVENOUS | Status: DC

## 2019-02-26 MED ORDER — SODIUM CHLORIDE 0.9 % IV SOLN: 250.0000 mL | INTRAVENOUS | Status: DC | PRN

## 2019-02-26 MED ORDER — SODIUM CHLORIDE 0.9 % IV SOLN: 200.0000 mg | Freq: Once | INTRAVENOUS | Status: DC

## 2019-02-26 MED ORDER — LIP MEDEX EX OINT
TOPICAL_OINTMENT | CUTANEOUS | Status: DC | PRN
  Filled 2019-02-26: qty 7

## 2019-02-26 MED ORDER — ASCORBIC ACID 500 MG PO TABS
500.0000 mg | ORAL_TABLET | Freq: Every day | ORAL | Status: DC
  Administered 2019-02-27 – 2019-03-02 (×4): 500 mg via ORAL
  Filled 2019-02-26 (×5): qty 1

## 2019-02-26 MED ORDER — ZINC SULFATE 220 (50 ZN) MG PO CAPS
220.0000 mg | ORAL_CAPSULE | Freq: Every day | ORAL | Status: DC
  Administered 2019-02-27 – 2019-03-04 (×6): 220 mg via ORAL
  Filled 2019-02-26 (×6): qty 1

## 2019-02-26 MED ORDER — ONDANSETRON HCL 4 MG/2ML IJ SOLN: 4.0000 mg | Freq: Four times a day (QID) | INTRAMUSCULAR | Status: DC | PRN

## 2019-02-26 MED ORDER — ACETAMINOPHEN 325 MG PO TABS
650.0000 mg | ORAL_TABLET | Freq: Once | ORAL | Status: AC
  Administered 2019-02-26: 650 mg via ORAL
  Filled 2019-02-26: qty 2

## 2019-02-26 MED ORDER — ACETAMINOPHEN 325 MG PO TABS
650.0000 mg | ORAL_TABLET | Freq: Four times a day (QID) | ORAL | Status: DC | PRN
  Administered 2019-02-27: 650 mg via ORAL
  Filled 2019-02-26: qty 2

## 2019-02-26 MED ORDER — ONDANSETRON HCL 4 MG PO TABS: 4.0000 mg | ORAL_TABLET | Freq: Four times a day (QID) | ORAL | Status: DC | PRN

## 2019-02-26 MED ORDER — SODIUM CHLORIDE 0.9% FLUSH: 3.0000 mL | INTRAVENOUS | Status: DC | PRN

## 2019-02-26 MED ORDER — DEXAMETHASONE SODIUM PHOSPHATE 10 MG/ML IJ SOLN
6.0000 mg | INTRAMUSCULAR | Status: DC
  Administered 2019-02-27: 6 mg via INTRAVENOUS
  Filled 2019-02-26: qty 1

## 2019-02-26 MED ORDER — SODIUM CHLORIDE 0.9 % IV SOLN
100.0000 mg | INTRAVENOUS | Status: AC
  Administered 2019-02-26 (×2): 100 mg via INTRAVENOUS

## 2019-02-26 MED ORDER — BRIMONIDINE TARTRATE 0.2 % OP SOLN
1.0000 [drp] | Freq: Two times a day (BID) | OPHTHALMIC | Status: DC
  Administered 2019-02-26 – 2019-03-04 (×13): 1 [drp] via OPHTHALMIC
  Filled 2019-02-26 (×2): qty 5

## 2019-02-26 NOTE — Progress Notes (Signed)
ANTICOAGULATION CONSULT NOTE - Initial Consult  Pharmacy Consult for Coumadin Indication: Factor V Leiden  Allergies  Allergen Reactions  . Codeine Nausea Only  . Erythromycin Nausea And Vomiting    Patient Measurements:    Vital Signs: Temp: 100.1 F (37.8 C) (01/04 2252) Temp Source: Oral (01/04 2252) BP: 102/66 (01/05 0330) Pulse Rate: 78 (01/05 0330)  Labs: Recent Labs    02/25/19 1555 02/25/19 2035  HGB 11.8*  --   HCT 35.5*  --   PLT 170  --   LABPROT  --  36.1*  INR  --  3.6*  CREATININE 0.87  --     Estimated Creatinine Clearance: 38.8 mL/min (by C-G formula based on SCr of 0.87 mg/dL).   Medical History: Past Medical History:  Diagnosis Date  . Allergic rhinitis   . Cough   . Difficulty in swallowing    w/ occasional aspiration  . DVT (deep venous thrombosis) ([place])   . Dyslipidemia   . Factor V Leiden deficiency    lifelong coumadin  . GERD (gastroesophageal reflux disease)   . History of nuclear stress test 12/28/2007   lexiscan; low risk   . HTN (hypertension)   . PND (post-nasal drip)   . Pulmonary embolism ([place])   . PVC's (premature ventricular contractions)   . Thyroid disease     Medications:  No current facility-administered medications on file prior to encounter.   Current Outpatient Medications on File Prior to Encounter  Medication Sig Dispense Refill  . acetaminophen (TYLENOL) 500 MG tablet Take 1,000 mg by mouth every 6 (six) hours as needed for moderate pain.    Marland Kitchen atorvastatin (LIPITOR) 10 MG tablet TAKE 1 TABLET (10 MG TOTAL) BY MOUTH DAILY AT 6 PM. 90 tablet 2  . azithromycin (ZITHROMAX) 250 MG tablet Take 2 pills today then one a day for 4 additional days. DX J40 (Patient not taking: Reported on 02/25/2019) 6 tablet 0  . brimonidine (ALPHAGAN) 0.15 % ophthalmic solution Place 1 drop into both eyes 2 (two) times daily.  3  . Calcium-Phosphorus-Vitamin D (CALCIUM/D3 ADULT GUMMIES) 200-96.6-200 MG-MG-UNIT CHEW Chew 2 tablets by  mouth daily.    . cetirizine (ZYRTEC) 10 MG tablet TAKE 1 TABLET BY MOUTH EVERY DAY 90 tablet 3  . chlorpheniramine (CHLOR-TRIMETON) 4 MG tablet Take 4 mg by mouth every morning.     . fluticasone (FLONASE) 50 MCG/ACT nasal spray Place 2 sprays into both nostrils daily. 16 g 2  . ipratropium (ATROVENT) 0.03 % nasal spray PLACE 2 SPRAYS INTO BOTH NOSTRILS 3 (THREE) TIMES DAILY AS NEEDED FOR RHINITIS. 30 mL 6  . latanoprost (XALATAN) 0.005 % ophthalmic solution Place 1 drop into both eyes at bedtime.     Marland Kitchen levothyroxine (SYNTHROID, LEVOTHROID) 25 MCG tablet TAKE 1 TABLET BY MOUTH EVERY DAY (Patient taking differently: Take 25 mcg by mouth daily before breakfast. ) 90 tablet 3  . losartan (COZAAR) 50 MG tablet Take 1 tablet (50 mg total) by mouth daily. 90 tablet 1  . metoprolol tartrate (LOPRESSOR) 25 MG tablet TAKE 1 TABLET (25 MG TOTAL) BY MOUTH 2 (TWO) TIMES DAILY. *NEEDS OFFICE VISIT FOR FURTHER REFILLS* 90 tablet 0  . multivitamin-iron-minerals-folic acid (CENTRUM) chewable tablet Chew 1 tablet by mouth daily.    . NON FORMULARY Peripheral neuropathy cream from [place]    . vitamin E (VITAMIN E) 1000 UNIT capsule Take 1,000 Units by mouth daily.    Marland Kitchen warfarin (COUMADIN) 7.5 MG tablet TAKE 1/2 TO  1 TABLET BY MOUTH DAILY AS DIRECTED 90 tablet 1     Assessment: 86 y.o. female admitted with SOB/COVD+ PNA, h/o PE and Factor V Leiden, to continue Coumadin.  INR tonight supratherapeutic   Goal of Therapy:  INR 2-3 Monitor platelets by anticoagulation protocol: Yes   Plan:  F/U daily INR  Eleanora Guinyard, Bronson Curb 02/26/2019,3:46 AM

## 2019-02-26 NOTE — ED Notes (Signed)
Pt stated she called her daughter on her cell phone and let her know where she was going.

## 2019-02-26 NOTE — ED Notes (Signed)
Report to [facility]. ETA 10 minutes. PT given po meal and peri-care

## 2019-02-26 NOTE — ED Notes (Signed)
[facility] called and requested home meds be sent over via courier.  

## 2019-02-26 NOTE — ED Notes (Signed)
PAtient is alert and oriented.  Denies pain.  Ate her breakfast (oatmeal and tea) without difficulty.

## 2019-02-26 NOTE — H&P (Signed)
History and Physical    Robin Harrell LFY:[id number] DOB: 1933/06/28 DOA: 02/25/2019  PCP: [name], MD  Patient coming from: Home  Chief Complaint: Cough  HPI: Robin Harrell is a 86 y.o. female with medical history significant of factor V Leyden deficiency, DVT, chronically on Coumadin, hypertension presents to [place] for cough and fever.  She has been sick over the last week.  Patient has small right upper lobe infiltrate referred for admission for COVID-19 positive test (U07.1, COVID-19) with Acute Pneumonia (J12.89, Other viral pneumonia) (If respiratory failure or sepsis present, add as separate assessment)  .  O2 sats are normal on room air.  Review of Systems: As per HPI otherwise 10 point review of systems negative.   Past Medical History:  Diagnosis Date  . Allergic rhinitis   . Cough   . Difficulty in swallowing    w/ occasional aspiration  . DVT (deep venous thrombosis) ([place])   . Dyslipidemia   . Factor V Leiden deficiency    lifelong coumadin  . GERD (gastroesophageal reflux disease)   . History of nuclear stress test 12/28/2007   lexiscan; low risk   . HTN (hypertension)   . PND (post-nasal drip)   . Pulmonary embolism ([place])   . PVC's (premature ventricular contractions)   . Thyroid disease     Past Surgical History:  Procedure Laterality Date  . APPENDECTOMY    . BUNIONECTOMY    . CATARACT EXTRACTION W/ INTRAOCULAR LENS IMPLANT Right 11/02/2013  . CHOLECYSTECTOMY  1973  . ESOPHAGEAL DILATION    . HAMMER TOE SURGERY    . history of sleep study  12/27/2007   AHI during total sleep 0.9/hr and REM 1.5/hr; RDI during total sleep6.0/hr and REM 6.1/hr  . 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 TRANSTHORACIC ECHOCARDIOGRAM  12/28/2007   borderline conc LVH with normal systolic function; MV mod thickened with mild MVP & mild MR     reports that she quit smoking about 61 years ago. Her smoking use included  cigarettes. She quit after 1.00 year of use. She has never used smokeless tobacco. She reports current alcohol use. She reports that she does not use drugs.  Allergies  Allergen Reactions  . Codeine Nausea Only  . Erythromycin Nausea And Vomiting    Family History  Problem Relation Age of Onset  . Lung cancer Father   . Cirrhosis Father   . Coronary artery disease Maternal Grandfather        MI  . Throat cancer Maternal Grandfather   . Heart disease Paternal Grandmother   . Coronary artery disease Paternal Grandfather        MI  . Throat cancer Paternal Grandfather   . Allergic rhinitis Sister   . Skin cancer Brother   . Breast cancer Daughter 55    Prior to Admission medications   Medication Sig Start Date End Date Taking? Authorizing Provider  acetaminophen (TYLENOL) 500 MG tablet Take 1,000 mg by mouth every 6 (six) hours as needed for moderate pain.    [provider]  atorvastatin (LIPITOR) 10 MG tablet TAKE 1 TABLET (10 MG TOTAL) BY MOUTH DAILY AT 6 PM. 09/20/18   Lorretta Harp, MD  azithromycin (ZITHROMAX) 250 MG tablet Take 2 pills today then one a day for 4 additional days. DX J40 Patient not taking: Reported on 02/25/2019 02/19/19   Collene Gobble, MD  brimonidine (ALPHAGAN) 0.15 % ophthalmic  solution Place 1 drop into both eyes 2 (two) times daily. 05/28/17   [provider]  Calcium-Phosphorus-Vitamin D (CALCIUM/D3 ADULT GUMMIES) 200-96.6-200 MG-MG-UNIT CHEW Chew 2 tablets by mouth daily.    [provider]  cetirizine (ZYRTEC) 10 MG tablet TAKE 1 TABLET BY MOUTH EVERY DAY 04/09/18   Collene Gobble, MD  chlorpheniramine (CHLOR-TRIMETON) 4 MG tablet Take 4 mg by mouth every morning.     [provider]  fluticasone (FLONASE) 50 MCG/ACT nasal spray Place 2 sprays into both nostrils daily. 01/16/19   Collene Gobble, MD  ipratropium (ATROVENT) 0.03 % nasal spray PLACE 2 SPRAYS INTO BOTH NOSTRILS 3 (THREE) TIMES DAILY AS NEEDED FOR  RHINITIS. 09/11/18   Collene Gobble, MD  latanoprost (XALATAN) 0.005 % ophthalmic solution Place 1 drop into both eyes at bedtime.  10/08/10   [provider]  levothyroxine (SYNTHROID, LEVOTHROID) 25 MCG tablet TAKE 1 TABLET BY MOUTH EVERY DAY Patient taking differently: Take 25 mcg by mouth daily before breakfast.  05/03/18   [name], MD  losartan (COZAAR) 50 MG tablet Take 1 tablet (50 mg total) by mouth daily. 09/20/18   [name], MD  metoprolol tartrate (LOPRESSOR) 25 MG tablet TAKE 1 TABLET (25 MG TOTAL) BY MOUTH 2 (TWO) TIMES DAILY. *NEEDS OFFICE VISIT FOR FURTHER REFILLS* 12/18/18   Lorretta Harp, MD  multivitamin-iron-minerals-folic acid (CENTRUM) chewable tablet Chew 1 tablet by mouth daily.    [provider]  NON FORMULARY Peripheral neuropathy cream from [place], Historical, MD  vitamin E (VITAMIN E) 1000 UNIT capsule Take 1,000 Units by mouth daily.    [provider]  warfarin (COUMADIN) 7.5 MG tablet TAKE 1/2 TO 1 TABLET BY MOUTH DAILY AS DIRECTED 09/26/18   Lorretta Harp, MD    Physical Exam: Vitals:   02/26/19 1730 02/26/19 1800 02/26/19 1813 02/26/19 1944  BP: 107/65 111/81 111/81 (!) 109/57  Pulse: 79 92 86 87  Resp: (!) 22 (!) 22 19 (!) 23  Temp:    98.1 F (36.7 C)  TempSrc:      SpO2: 94% 95% 95% 96%      Constitutional: NAD, calm, comfortable Vitals:   02/26/19 1730 02/26/19 1800 02/26/19 1813 02/26/19 1944  BP: 107/65 111/81 111/81 (!) 109/57  Pulse: 79 92 86 87  Resp: (!) 22 (!) 22 19 (!) 23  Temp:    98.1 F (36.7 C)  TempSrc:      SpO2: 94% 95% 95% 96%   Eyes: PERRL, lids and conjunctivae normal ENMT: Mucous membranes are moist. Posterior pharynx clear of any exudate or lesions.Normal dentition.  Neck: normal, supple, no masses, no thyromegaly Respiratory: clear to auscultation bilaterally, no wheezing, no crackles. Normal respiratory effort. No accessory muscle use.    Cardiovascular: Regular rate and rhythm, no murmurs / rubs / gallops. No extremity edema. 2+ pedal pulses. No carotid bruits.  Abdomen: no tenderness, no masses palpated. No hepatosplenomegaly. Bowel sounds positive.  Musculoskeletal: no clubbing / cyanosis. No joint deformity upper and lower extremities. Good ROM, no contractures. Normal muscle tone.  Skin: no rashes, lesions, ulcers. No induration Neurologic: CN 2-12 grossly intact. Sensation intact, DTR normal. Strength 5/5 in all 4.  Psychiatric: Normal judgment and insight. Alert and oriented x 3. Normal mood.    Labs on Admission: I have personally reviewed following labs and imaging studies  CBC: Recent Labs  Lab 02/25/19 1555  WBC 7.5  NEUTROABS 6.4  HGB 11.8*  HCT 35.5*  MCV 91.3  PLT 947   Basic Metabolic Panel: Recent Labs  Lab 02/25/19 1555  NA 129*  K 4.0  CL 95*  CO2 22  GLUCOSE 152*  BUN 16  CREATININE 0.87  CALCIUM 8.5*   GFR: Estimated Creatinine Clearance: 38.8 mL/min (by C-G formula based on SCr of 0.87 mg/dL). Liver Function Tests: Recent Labs  Lab 02/25/19 1555  AST 33  ALT 17  ALKPHOS 59  BILITOT 1.1  PROT 7.3  ALBUMIN 3.4*   No results for input(s): LIPASE, AMYLASE in the last 168 hours. No results for input(s): AMMONIA in the last 168 hours. Coagulation Profile: Recent Labs  Lab 02/25/19 2035 02/26/19 0500  INR 3.6* 4.1*   Cardiac Enzymes: No results for input(s): CKTOTAL, CKMB, CKMBINDEX, TROPONINI in the last 168 hours. BNP (last 3 results) No results for input(s): PROBNP in the last 8760 hours. HbA1C: No results for input(s): HGBA1C in the last 72 hours. CBG: No results for input(s): GLUCAP in the last 168 hours. Lipid Profile: Recent Labs    02/25/19 2035  TRIG 60   Thyroid Function Tests: No results for input(s): TSH, T4TOTAL, FREET4, T3FREE, THYROIDAB in the last 72 hours. Anemia Panel: Recent Labs    02/25/19 2035  FERRITIN 550*   Urine analysis:     Component Value Date/Time   COLORURINE YELLOW 12/31/2018 1152   APPEARANCEUR Sl Cloud[place] (A) 12/31/2018 1152   LABSPEC 1.020 12/31/2018 1152   PHURINE 5.5 12/31/2018 1152   GLUCOSEU NEGATIVE 12/31/2018 1152   HGBUR NEGATIVE 12/31/2018 1152   BILIRUBINUR NEGATIVE 12/31/2018 1152   BILIRUBINUR Negative 10/17/2018 1144   KETONESUR NEGATIVE 12/31/2018 1152   PROTEINUR Negative 10/17/2018 1144   PROTEINUR 100 (A) 08/13/2018 1559   UROBILINOGEN 0.2 12/31/2018 1152   NITRITE POSITIVE (A) 12/31/2018 1152   LEUKOCYTESUR TRACE (A) 12/31/2018 1152   Sepsis Labs: !!!!!!!!!!!!!!!!!!!!!!!!!!!!!!!!!!!!!!!!!!!! '@LABRCNTIP'$ (procalcitonin:4,lacticidven:4) ) Recent Results (from the past 240 hour(s))  SARS Coronavirus 2 Ag (30 min TAT) - Nasal Swab (BD Veritor Kit)     Status: Abnormal   Collection Time: 02/25/19  3:46 PM   Specimen: Nasal Swab (BD Veritor Kit)  Result Value Ref Range Status   SARS Coronavirus 2 Ag POSITIVE (A) NEGATIVE Final    Comment: RESULT CALLED TO, READ BACK BY AND VERIFIED WITH:  MARVA SIMMS '@1627'$  ON 02/25/2019, [email or web address].P (NOTE) SARS-CoV-2 antigen PRESENT. Positive results indicate the presence of viral antigens, but clinical correlation with patient history and other diagnostic information is necessary to determine patient infection status.  Positive results do not rule out bacterial infection or co-infection  with other viruses. False positive results are rare but can occur, and confirmatory RT-PCR testing may be appropriate in some circumstances. The expected result is Negative. Fact Sheet for Patients: PodPark.tn Fact Sheet for Providers: GiftContent.is  This test is not yet approved or cleared by the [place] FDA and  has been authorized for detection and/or diagnosis of SARS-CoV-2 by FDA under an Emergency Use Authorization (EUA).  This EUA will remain in effect (meaning this test can be used) for the  duration of  the COVID- 19 declaration under Section 564(b)(1) of the Act, 21 U.S.C. section 360bbb-3(b)(1), unless the authorization is terminated or revoked sooner. Performed at [facility], [address] [place], [place] [address]   Blood Culture (routine x 2)     Status: None (Preliminary result)   Collection Time: 02/25/19  8:35 PM   Specimen:  BLOOD  Result Value Ref Range Status   Specimen Description   Final    BLOOD RIGHT ANTECUBITAL Performed at [facility], [address] [place], [place] [address]    Special Requests   Final    BOTTLES DRAWN AEROBIC AND ANAEROBIC Blood Culture adequate volume Performed at [facility], [address] [place], [place] [address]    Culture   Final    NO GROWTH < 12 HOURS Performed at [facility], [place] [address] [place], [place] [address]    Report Status PENDING  Incomplete  Blood Culture (routine x 2)     Status: None (Preliminary result)   Collection Time: 02/25/19  9:00 PM   Specimen: BLOOD  Result Value Ref Range Status   Specimen Description   Final    BLOOD LEFT ANTECUBITAL Performed at [facility], [address] [place], [place] [address]    Special Requests   Final    BOTTLES DRAWN AEROBIC AND ANAEROBIC Blood Culture adequate volume Performed at [facility], [address] [place], [place] [address]    Culture   Final    NO GROWTH < 12 HOURS Performed at [facility], [place] [address], [place], [place] [address]    Report Status PENDING  Incomplete     Radiological Exams on Admission: CT Angio Chest PE W and/or Wo Contrast  Result Date: 02/25/2019 CLINICAL DATA:  Shortness of breath. EXAM: CT ANGIOGRAPHY CHEST WITH CONTRAST TECHNIQUE: Multidetector CT imaging of the chest was performed using the standard protocol during bolus administration of intravenous contrast. Multiplanar CT image reconstructions and MIPs were obtained to evaluate the  vascular anatomy. CONTRAST:  141m OMNIPAQUE IOHEXOL 350 MG/ML SOLN COMPARISON:  None. FINDINGS: Cardiovascular: Contrast injection is sufficient to demonstrate satisfactory opacification of the pulmonary arteries to the segmental level. There is no pulmonary embolus. The main pulmonary artery is dilated measuring approximately 3.4 cm in diameter. There is no CT evidence of acute right heart strain. There are atherosclerotic changes of the thoracic aorta. Heart size is enlarged. There is significant left atrial enlargement. Mitral valve calcifications are noted. Mediastinum/Nodes: --his likely reactive mediastinal and hilar adenopathy. --No axillary lymphadenopathy. --No supraclavicular lymphadenopathy. --Normal thyroid gland. --The esophagus is unremarkable Lungs/Pleura: There are diffuse bilateral ground-glass airspace opacities. There is a small right-sided pleural effusion with adjacent atelectasis. There is a small amount of atelectasis at the left lung base. The trachea is unremarkable. There is no pneumothorax. Upper Abdomen: No acute abnormality. Musculoskeletal: No chest wall abnormality. No acute or significant osseous findings. Review of the MIP images confirms the above findings. IMPRESSION: 1. No evidence for pulmonary embolus. 2. Diffuse bilateral ground-glass airspace opacities. This can be seen in patients with an atypical infectious process such as viral pneumonia or developing pulmonary edema. 3. Cardiomegaly with significant left atrial enlargement. 4. Small right-sided pleural effusion with adjacent atelectasis. 5. Enlarged main pulmonary artery, can be seen with pulmonary arterial hypertension. Aortic Atherosclerosis (ICD10-I70.0). Electronically Signed   By: CConstance HolsterM.D.   On: 02/25/2019 21:35   DG Chest Portable 1 View  Result Date: 02/25/2019 CLINICAL DATA:  Shortness of breath. Cough. Weakness. COVID-19. EXAM: PORTABLE CHEST 1 VIEW COMPARISON:  08/12/2018 and 08/11/2018  FINDINGS: The heart size and pulmonary vascularity are normal. Small focal area of infiltrate in the left upper lobe. Chronic slight accentuation of the interstitial markings as demonstrated on the prior chest  CT. No acute bone abnormality. IMPRESSION: Small focal infiltrate in the left upper lobe. Mild chronic interstitial lung disease. Electronically Signed   By: Lorriane Shire M.D.   On: 02/25/2019 18:32   Old chart reviewed Case discussed with Dr. Alvino Chapel at [facility] last night    Assessment/Plan 86 year old female with Covid pneumonia with normal vital signs and normal O2 sats on room air Principal Problem:   Pneumonia due to COVID-19 virus-placed on Decadron remdesivir.  Patient currently on room air with normal O2 sats.  Monitor through the night.  Consider outpatient remdesivir in the morning.  Ambulate.  Active Problems:   Acute respiratory failure due to COVID-19 (HCC)-as above    Essential hypertension-noted    History of DVT (deep vein thrombosis)-INR 4.1.  Continue Coumadin per pharmacy    Chronic idiopathic thrombocytopenia (HCC)-stable    Diastolic dysfunction-stable at this time    Factor V deficiency (HCC)-continue Coumadin     DVT prophylaxis: Coumadin Code Status: Full Family Communication: None Disposition Plan: Likely tomorrow Consults called: None Admission status: Admission   Sharnetta Gielow A MD [facility]  If 7PM-7AM, please contact night-coverage [email or web address] Password [email or web address]  02/26/2019, 9:02 PM

## 2019-02-26 NOTE — ED Notes (Signed)
Pt's daughter [name] given [facility] room and phone number.

## 2019-02-27 LAB — CBC WITH DIFFERENTIAL/PLATELET
Abs Immature Granulocytes: 0.07 10*3/uL (ref 0.00–0.07)
Basophils Absolute: 0 10*3/uL (ref 0.0–0.1)
Basophils Relative: 0 %
Eosinophils Absolute: 0 10*3/uL (ref 0.0–0.5)
Eosinophils Relative: 0 %
HCT: 33.1 % — ABNORMAL LOW (ref 36.0–46.0)
Hemoglobin: 11 g/dL — ABNORMAL LOW (ref 12.0–15.0)
Immature Granulocytes: 1 %
Lymphocytes Relative: 8 %
Lymphs Abs: 0.8 10*3/uL (ref 0.7–4.0)
MCH: 30.5 pg (ref 26.0–34.0)
MCHC: 33.2 g/dL (ref 30.0–36.0)
MCV: 91.7 fL (ref 80.0–100.0)
Monocytes Absolute: 0.8 10*3/uL (ref 0.1–1.0)
Monocytes Relative: 8 %
Neutro Abs: 8.6 10*3/uL — ABNORMAL HIGH (ref 1.7–7.7)
Neutrophils Relative %: 83 %
Platelets: 211 10*3/uL (ref 150–400)
RBC: 3.61 MIL/uL — ABNORMAL LOW (ref 3.87–5.11)
RDW: 12.7 % (ref 11.5–15.5)
WBC: 10.3 10*3/uL (ref 4.0–10.5)
nRBC: 0 % (ref 0.0–0.2)

## 2019-02-27 LAB — COMPREHENSIVE METABOLIC PANEL
ALT: 27 U/L (ref 0–44)
AST: 37 U/L (ref 15–41)
Albumin: 2.9 g/dL — ABNORMAL LOW (ref 3.5–5.0)
Alkaline Phosphatase: 53 U/L (ref 38–126)
Anion gap: 10 (ref 5–15)
BUN: 25 mg/dL — ABNORMAL HIGH (ref 8–23)
CO2: 24 mmol/L (ref 22–32)
Calcium: 8.2 mg/dL — ABNORMAL LOW (ref 8.9–10.3)
Chloride: 102 mmol/L (ref 98–111)
Creatinine, Ser: 0.77 mg/dL (ref 0.44–1.00)
GFR calc Af Amer: >60 mL/min (ref >60)
GFR calc non Af Amer: >60 mL/min (ref >60)
Glucose, Bld: 117 mg/dL — ABNORMAL HIGH (ref 70–99)
Potassium: 3.5 mmol/L (ref 3.5–5.1)
Sodium: 136 mmol/L (ref 135–145)
Total Bilirubin: 1 mg/dL (ref 0.3–1.2)
Total Protein: 6.4 g/dL — ABNORMAL LOW (ref 6.5–8.1)

## 2019-02-27 LAB — D-DIMER, QUANTITATIVE: D-Dimer, Quant: 0.68 ug/mL-FEU — ABNORMAL HIGH (ref 0.00–0.50)

## 2019-02-27 LAB — PROTIME-INR
INR: 4.6 (ref 0.8–1.2)
Prothrombin Time: 43.6 seconds — ABNORMAL HIGH (ref 11.4–15.2)

## 2019-02-27 LAB — C-REACTIVE PROTEIN: CRP: 17.5 mg/dL — ABNORMAL HIGH (ref <1.0)

## 2019-02-27 LAB — ABO/RH: ABO/RH(D): B POS

## 2019-02-27 MED ORDER — DEXAMETHASONE SODIUM PHOSPHATE 4 MG/ML IJ SOLN: 4.0000 mg | INTRAMUSCULAR | Status: DC

## 2019-02-27 MED ORDER — LACTATED RINGERS IV SOLN: INTRAVENOUS | Status: AC

## 2019-02-27 MED ORDER — ENSURE ENLIVE PO LIQD
237.0000 mL | Freq: Three times a day (TID) | ORAL | Status: DC
  Administered 2019-02-27 – 2019-03-04 (×13): 237 mL via ORAL

## 2019-02-27 NOTE — Progress Notes (Addendum)
Physical Therapy Evaluation Patient Details Name: Robin Harrell MRN: [id number] DOB: [birth year] Today's Date: 02/27/2019   History of Present Illness  Robin Harrell is a 86 y.o. 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 Patient has small right upper lobe infiltrate, and with Acute Pneumonia secondary to [place].  Clinical Impression  Presents as mildly confused with reduced safety awareness and reduced insight into current medical status. She is generally weak, and at risk for falls. She is incontinent. She should benefit from skilled PT to further address goals as outlined. Physical Therapy Evaluation Patient Details Name: Robin Harrell MRN: [id number] DOB: [birth year] Today's Date: 02/27/2019   History of Present Illness  Robin Harrell is a 86 y.o. 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 Patient has small right upper lobe infiltrate, and with Acute Pneumonia secondary to [place].  Clinical Impression  She is very pleasant, but confused, with reduced safety awareness and reduced insight into her current medical status. Her husband has Parkinson's and is deaf, and one of her four daughters is taking care of him currently- both of them are being tested for COVID today. She was independent in all PLOF including driving- using a SPC. Currently generally weak and at risk for falls. Utilized RW during PT evaluation. Should benefit from skilled PT to further address goals as detailed for optimal functional outcomes     Follow Up Recommendations SNF(Much depends on whether her daughter   (caring for her spouse at home) and her spouse have COVID, they are being tested today.)    Equipment Recommendations       Recommendations for Other Services       Precautions / Restrictions  Precautions Precautions: Fall Restrictions Weight Bearing Restrictions: No      Mobility  Bed Mobility Overal bed mobility: Needs Assistance Bed Mobility: Supine to Sit     Supine to sit: Mod assist     General bed mobility comments: vc for sequencing, use of pad to assist bringing hips EOB  Transfers Overall transfer level: Needs assistance Equipment used: Rolling walker (2 wheeled) Transfers: Sit to/from [facility] Sit to Stand: Mod assist;Min assist Stand pivot transfers: Mod assist       General transfer comment: Min to Mod assist for sit<>stand from [facility] chair or BS commode  Ambulation/Gait Ambulation/Gait assistance: Min guard;Min assist Gait Distance (Feet): 36 Feet Assistive device: Rolling walker (2 wheeled) Gait Pattern/deviations: Narrow base of support;Shuffle        Stairs            Wheelchair Mobility    Modified Rankin (Stroke Patients Only)       Balance Overall balance assessment: Mild deficits observed, not formally tested Sitting-balance support: Single extremity supported Sitting balance-Leahy Scale: Good     Standing balance support: Single extremity supported Standing balance-Leahy Scale: Fair Standing balance comment: Utilized RW (FW) and was more steady than attempting with SPC (hers is in room)                             Pertinent Vitals/Pain Pain Assessment: No/denies pain    Home Living Family/patient expects to be discharged to:: Private residence Living Arrangements: Spouse/significant other Available Help at Discharge: Family(all 4 daughters live elsewhere,  three out of state and only one here in [place]) Type of Home: House Home Access: Stairs to enter Entrance Stairs-Rails: Left Entrance Stairs-Number of Steps: 1 Home Layout: One level Home Equipment: Walker - 2 wheels;Wheelchair - manual;Shower seat;Grab bars - tub/shower;Hand held shower head Additional Comments: husband has beginning  parkinsons and is deaf, they have 4 daughters that are very supportive, but not all are local. one of the daughters is getting tested for covid along with her husband 02/27/2019    Prior Function Level of Independence: Independent with assistive device(s)         Comments: uses SPC, daughters do cooking and cleaning. still driving     Hand Dominance   Dominant Hand: Right    Extremity/Trunk Assessment   Upper Extremity Assessment Upper Extremity Assessment: Defer to OT evaluation    Lower Extremity Assessment Lower Extremity Assessment: Defer to PT evaluation;Generalized weakness    Cervical / Trunk Assessment Cervical / Trunk Assessment: Kyphotic  Communication   Communication: No difficulties  Cognition Arousal/Alertness: Awake/alert Behavior During Therapy: WFL for tasks assessed/performed Overall Cognitive Status: Impaired/Different from baseline Area of Impairment: Safety/judgement;Awareness                         Safety/Judgement: Decreased awareness of safety;Decreased awareness of deficits Awareness: Anticipatory Problem Solving: Requires verbal cues;Requires tactile cues General Comments: Pt with decreased insights into deficits, slightly impulsive increasing fall risk       General Comments General comments (skin integrity, edema, etc.): Changed ear probe and had sucessful readings >95% throughout session.    Exercises General Exercises - Lower Extremity Ankle Circles/Pumps: AROM;Seated;Left;Right Quad Sets: AROM;Seated;Right;Left Long Arc Quad: AROM;Seated;Right;Left   Assessment/Plan    PT Assessment Patient needs continued PT services  PT Problem List Decreased strength;Decreased activity tolerance;Cardiopulmonary status limiting activity;Decreased mobility       PT Treatment Interventions Gait training;Functional mobility training;Therapeutic activities;Patient/family education;Therapeutic exercise    PT Goals (Current goals can be  found in the Care Plan section)  Acute Rehab PT Goals Patient Stated Goal: Just wants to be able to return to home as independent as possible PT Goal Formulation: With patient Time For Goal Achievement: 03/13/19 Potential to Achieve Goals: Good    Frequency Min 3X/week   Barriers to discharge        Co-evaluation               AM-PAC PT "6 Clicks" Mobility  Outcome Measure Help needed turning from your back to your side while in a flat bed without using bedrails?: A Little Help needed moving from lying on your back to sitting on the side of a flat bed without using bedrails?: A Little Help needed moving to and from a bed to a chair (including a wheelchair)?: A Little Help needed standing up from a chair using your arms (e.g., wheelchair or bedside chair)?: A Lot Help needed to walk in hospital room?: A Little Help needed climbing 3-5 steps with a railing? : A Lot 6 Click Score: 16    End of Session   Activity Tolerance: Patient tolerated treatment well Patient left: in chair;with call bell/phone within reach   PT Visit Diagnosis: Unsteadiness on feet (R26.81);Muscle weakness (generalized) (M62.81)    Time: [id number] PT Time Calculation (min) (ACUTE ONLY): 45 min   Charges:   PT Evaluation $PT Eval Moderate Complexity: 1 Mod PT Treatments $Gait Training: 8-22 mins $Therapeutic Exercise: 8-22 mins  Rollen Sox, PT # [phone] CGV cell  Casandra Doffing 02/27/2019, 2:38 PM

## 2019-02-27 NOTE — Plan of Care (Signed)
Patient remains Aox4, began shift on 2L [place], quickly switched to Room air with sats remaining greater than 95%, patient able to stand and pivot with help of walker, very unsteady, bladder incontinent with cough, vitals wnl, BP soft, possible d/c with outpatient Remdesivir treatment, safety precautions maintained, continue poc Problem: Education: Goal: Knowledge of risk factors and measures for prevention of condition will improve Outcome: Progressing   Problem: Coping: Goal: Psychosocial and spiritual needs will be supported Outcome: Progressing   Problem: Respiratory: Goal: Will maintain a patent airway Outcome: Progressing Goal: Complications related to the disease process, condition or treatment will be avoided or minimized Outcome: Progressing   Problem: Education: Goal: Knowledge of General Education information will improve Description: Including pain rating scale, medication(s)/side effects and non-pharmacologic comfort measures Outcome: Progressing   Problem: Health Behavior/Discharge Planning: Goal: Ability to manage health-related needs will improve Outcome: Progressing   Problem: Clinical Measurements: Goal: Ability to maintain clinical measurements within normal limits will improve Outcome: Progressing Goal: Will remain free from infection Outcome: Progressing Goal: Diagnostic test results will improve Outcome: Progressing Goal: Respiratory complications will improve Outcome: Progressing Goal: Cardiovascular complication will be avoided Outcome: Progressing   Problem: Activity: Goal: Risk for activity intolerance will decrease Outcome: Progressing   Problem: Nutrition: Goal: Adequate nutrition will be maintained Outcome: Progressing   Problem: Coping: Goal: Level of anxiety will decrease Outcome: Progressing   Problem: Elimination: Goal: Will not experience complications related to bowel motility Outcome: Progressing Goal: Will not experience  complications related to urinary retention Outcome: Progressing   Problem: Pain Managment: Goal: General experience of comfort will improve Outcome: Progressing   Problem: Safety: Goal: Ability to remain free from injury will improve Outcome: Progressing   Problem: Skin Integrity: Goal: Risk for impaired skin integrity will decrease Outcome: Progressing

## 2019-02-27 NOTE — Evaluation (Signed)
Occupational Therapy Evaluation Patient Details Name: Robin Harrell MRN: [id number] DOB: [birth year] Today's Date: 02/27/2019    History of Present Illness Robin Harrell is a 86 y.o. 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 Patient has small right upper lobe infiltrate, and with Acute Pneumonia secondary to [place].   Clinical Impression   Prior to admission, Pt reports that she was mod I in home environment doing own ADL and mobilizing with DME ([place] and shower chair) and her daughters assist with cooking/cleaning. She was still driving. Today Pt was found incontinent (typical at baseline), transferred to [facility] with Min physical assist - but requiring cues for problem solving, safety, and line management. She also was DOE 2/4 and unable to get SpO2 reading via ear. Pt will benefit from skilled OT both while in the acute setting and afterwards. Currently recommending SNF stay as I could not get a hold of family (left voicemail for daughter [name]). If she has 24 hour supervision/assist - other than her husband - than she can go home. Otherwise she will require skilled nursing placement to maximize safety and independence in ADL and functional transfers. OT will continue to follow acutely and continue to reach out to family.    Follow Up Recommendations  SNF;Home health OT;Supervision/Assistance - 24 hour(if we can confirm 24 hour sup/assist at home)    Equipment Recommendations  3 in 1 bedside commode    Recommendations for Other Services       Precautions / Restrictions Precautions Precautions: Fall Restrictions Weight Bearing Restrictions: No      Mobility Bed Mobility Overal bed mobility: Needs Assistance Bed Mobility: Supine to Sit     Supine to sit: Mod assist     General bed mobility comments: vc for sequencing, use of pad to assist bringing hips  EOB  Transfers Overall transfer level: Needs assistance Equipment used: 1 person hand held assist Transfers: Sit to/from [employer] Transfers Sit to Stand: Mod assist;From elevated surface Stand pivot transfers: Mod assist;From elevated surface       General transfer comment: min A for power up, assist for balance, and line management, Pt unsteady and requires cues for problem solving    Balance Overall balance assessment: Needs assistance Sitting-balance support: Single extremity supported;Feet supported Sitting balance-Leahy Scale: Fair     Standing balance support: Single extremity supported Standing balance-Leahy Scale: Poor Standing balance comment: dependent on at least one external support                           ADL either performed or assessed with clinical judgement   ADL Overall ADL's : Needs assistance/impaired Eating/Feeding: Set up;Sitting   Grooming: Set up;Sitting;Wash/dry hands;Wash/dry face;Oral care Grooming Details (indicate cue type and reason): in recliner Upper Body Bathing: Minimal assistance;Sitting   Lower Body Bathing: Sit to/from stand;Minimal assistance   Upper Body Dressing : Set up   Lower Body Dressing: Moderate assistance;Sit to/from stand Lower Body Dressing Details (indicate cue type and reason): able to don socks utilizing figure 4, min A for sot<>stand and mod A for clothing management Toilet Transfer: Moderate assistance;BSC Toilet Transfer Details (indicate cue type and reason): face to face, assist for boost, balance, and line management Toileting- Clothing Manipulation and Hygiene: Minimal assistance;Sit to/from stand       Functional mobility during ADLs: Minimal assistance;Cueing for safety(hand held assist,  needs RW or SPC) General ADL Comments: decreased insight into deficits, increased fall risk, trouble getting SpO2 to read, DOE 2/4     Vision         Perception     Praxis      Pertinent  Vitals/Pain Pain Assessment: No/denies pain     Hand Dominance Right   Extremity/Trunk Assessment Upper Extremity Assessment Upper Extremity Assessment: Generalized weakness   Lower Extremity Assessment Lower Extremity Assessment: Generalized weakness       Communication Communication Communication: No difficulties   Cognition Arousal/Alertness: Awake/alert Behavior During Therapy: WFL for tasks assessed/performed;Impulsive Overall Cognitive Status: Impaired/Different from baseline Area of Impairment: Safety/judgement;Awareness;Problem solving                         Safety/Judgement: Decreased awareness of safety;Decreased awareness of deficits Awareness: Anticipatory Problem Solving: Requires verbal cues General Comments: Pt with decreased insights into deficits, slightly impulsive increasing fall risk    General Comments  Pt on RA throughout session, however did get DOE 2/4 and could not get pulse-ox (on ear) to read for O2 levels - at this time    Exercises     Shoulder Instructions      Home Living Family/patient expects to be discharged to:: Private residence Living Arrangements: Spouse/significant other Available Help at Discharge: Family Type of Home: House Home Access: Stairs to enter Technical brewer of Steps: 1 Entrance Stairs-Rails: Left Home Layout: One level     Bathroom Shower/Tub: Occupational psychologist: Standard     Home Equipment: Environmental consultant - 2 wheels;Wheelchair - manual;Shower seat;Grab bars - tub/shower;Hand held shower head   Additional Comments: husband has beginning parkinsons and is deaf, they have 4 daughters that are very supportive, but not all are local. one of the daughters is getting tested for covid along with her husband 02/27/2019      Prior Functioning/Environment Level of Independence: Independent with assistive device(s)        Comments: uses SPC, daughters do cooking and cleaning. still driving         OT Problem List: Decreased strength;Decreased range of motion;Decreased activity tolerance;Impaired balance (sitting and/or standing);Decreased safety awareness;Decreased knowledge of use of DME or AE;Cardiopulmonary status limiting activity      OT Treatment/Interventions: Self-care/ADL training;Therapeutic exercise;Energy conservation;DME and/or AE instruction;Therapeutic activities;Cognitive remediation/compensation;Patient/family education;Balance training    OT Goals(Current goals can be found in the care plan section) Acute Rehab OT Goals Patient Stated Goal: to get back home OT Goal Formulation: With patient Time For Goal Achievement: 03/13/19 Potential to Achieve Goals: Good ADL Goals Pt Will Perform Grooming: with supervision;standing Pt Will Perform Upper Body Dressing: with set-up;sitting Pt Will Perform Lower Body Dressing: with supervision;sit to/from stand Pt Will Transfer to Toilet: with supervision;ambulating Additional ADL Goal #1: Pt will recall 3 ways of conserving energy during ADL routine with one or less verbal cue  OT Frequency: Min 2X/week   Barriers to D/C: (need to speak with family to confirm 24 hour support)  I attempted to call daughter at 1:00 pm 1/6 and left VM to confirm PLOF as well as home set up/supervision/assist at home. no answer       Co-evaluation              AM-PAC OT "6 Clicks" Daily Activity     Outcome Measure Help from another person eating meals?: A Little Help from another person taking care of personal grooming?: A Little Help from  another person toileting, which includes using toliet, bedpan, or urinal?: A Lot Help from another person bathing (including washing, rinsing, drying)?: A Lot Help from another person to put on and taking off regular upper body clothing?: A Little Help from another person to put on and taking off regular lower body clothing?: A Lot 6 Click Score: 15   End of Session Equipment Utilized  During Treatment: Gait belt Nurse Communication: Mobility status;Precautions  Activity Tolerance: Patient tolerated treatment well Patient left: in chair;with call bell/phone within reach;with chair alarm set  OT Visit Diagnosis: Unsteadiness on feet (R26.81);Other abnormalities of gait and mobility (R26.89);Muscle weakness (generalized) (M62.81);Other symptoms and signs involving cognitive function                Time: [id number] OT Time Calculation (min): 50 min Charges:  OT General Charges $OT Visit: 1 Visit OT Evaluation $OT Eval Moderate Complexity: 1 Mod OT Treatments $Self Care/Home Management : 8-22 mins $Therapeutic Activity: 8-22 mins  Jesse Sans OTR/L Acute Rehabilitation Services Pager: [phone] Office: [phone]  Mickel Baas 02/27/2019, 1:35 PM

## 2019-02-27 NOTE — Progress Notes (Addendum)
Initial Nutrition Assessment RD working remotely.  DOCUMENTATION CODES:   Not applicable  INTERVENTION:    Ensure Enlive po TID, each supplement provides 350 kcal and 20 grams of protein.   Liberalize diet to regular, discussed with attending physician.   Pt receiving Hormel Shake daily with Breakfast which provides 520 kcals and 22 g of protein and Magic cup BID with lunch and dinner, each supplement provides 290 kcal and 9 grams of protein, automatically on meal trays to optimize nutritional intake.   NUTRITION DIAGNOSIS:   Increased nutrient needs related to acute illness(COVID-19 PNA) as evidenced by estimated needs.  GOAL:   Patient will meet greater than or equal to 90% of their needs   MONITOR:   PO intake, Supplement acceptance  REASON FOR ASSESSMENT:   Malnutrition Screening Tool    ASSESSMENT:   86 yo female admitted with COVID-19 PNA. PMH includes factor 5 leyden deficiency, DVT on coumadin, HTN.   Labs reviewed.  Medications reviewed and include vitamin C, decadron, zinc sulfate, remdesivir.  Patient with 7% weight loss within the past year. Current weight is the exact same as from 1 month ago, 58.5 kg, suspect weight was carried over from previous date vs a new measured weight.   On admission, patient reported poor intake with weight loss. 7% weight loss within 1 year is not significant for the time frame.   Patient is currently on a heart healthy CHO modified diet; she consumed 20% of lunch today. No hx of DM noted.   Patient would benefit from PO supplements to ensure adequate intake to meet increased nutrition needs for COVID-19.  NUTRITION - FOCUSED PHYSICAL EXAM:  unable to complete due to COVID restrictions  Diet Order:   Diet Order            Diet heart healthy/carb modified Room service appropriate? Yes; Fluid consistency: Thin  Diet effective now              EDUCATION NEEDS:   Not appropriate for education at this time  Skin:   Skin Assessment: Reviewed RN Assessment  Last BM:  no BM documented  Height:   Ht Readings from Last 1 Encounters:  02/25/19 5\' 2"  (1.575 m)    Weight:   Wt Readings from Last 1 Encounters:  02/25/19 58.5 kg    Ideal Body Weight:     BMI:  23.6  Estimated Nutritional Needs:   Kcal:  1600-1800  Protein:  75-100 gm  Fluid:  >/= 1.6 L   Molli Barrows, RD, LDN, [place] Pager [phone] After Hours Pager [phone]

## 2019-02-27 NOTE — Progress Notes (Signed)
PROGRESS NOTE                                                                                                                                                                                                             Patient Demographics:    Robin Harrell, is a 86 y.o. female, DOB - 09/27/1933, NOM:[id number]  Outpatient Primary MD for the patient is [name], MD    LOS - 2  Admit date - 02/25/2019    Chief Complaint  Patient presents with  . Cough    + covid       Brief Narrative   Robin Harrell is a 86 y.o. 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  She has been sick over the last week, he was diagnosed with COVID-19 pneumonia and admitted to the hospital.   Subjective:    Robin Harrell today has, No headache, No chest pain, No abdominal pain - No Nausea, No new weakness tingling or numbness, no Cough - SOB.     Assessment  & Plan :     1.  Acute Covid 19 Viral Pneumonitis during the ongoing 2020 Covid 19 Pandemic - she has mild to moderate disease, has been placed on IV steroids and remdesivir, her overall issue appears to be extreme weakness and deconditioning due to advanced age.  Will encourage to increase activity, sit up in chair, PT OT evaluation, patient has no help at home and may require SNF.  Encouraged the patient to sit up in chair in the daytime use I-S and flutter valve for pulmonary toiletry and then prone in bed when at night.  SpO2: 95 % O2 Flow Rate (L/min): 2 L/min  Recent Labs  Lab 02/25/19 2035 02/27/19 0400  CRP 23.9* 17.5*  DDIMER 1.05* 0.68*  FERRITIN 550*  --   PROCALCITON <0.10  --     Hepatic Function Latest Ref Rng & Units 02/27/2019 02/25/2019 01/29/2019  Total Protein 6.5 - 8.1 g/dL 6.4(L) 7.3 7.0  Albumin 3.5 - 5.0 g/dL 2.9(L)  3.4(L) 4.2  AST 15 - 41 U/L 37 33 23  ALT 0 - 44 U/L _0 Alk Phosphatase 38 - 126 U/L 53 59 59  Total  Bilirubin 0.3 - 1.2 mg/dL 1.0 1.1 0.7  Bilirubin, Direct 0.0 - 0.3 mg/dL - - -    2.  History of DVT factor V Leyden deficiency.  On Coumadin. Pharmacy monitoring INR.  3.  Chronic idiopathic thrombocytopenia.  Stable no acute issues.   4.  Essential hypertension.  On combination of beta-blocker, ARB.  Monitor and adjust.  5.  Dehydration, severe weakness and deconditioning.  PT, OT.  May require SNF.  IV fluids.  6.  Chronic diastolic CHF.  60% in 2018.  Compensated.   Condition - Fair  Family Communication  :  Daughter 02/27/19  Code Status :  Full  Diet :   Diet Order            Diet heart healthy/carb modified Room service appropriate? Yes; Fluid consistency: Thin  Diet effective now               Disposition Plan  :  TBD  Consults  :  None  Procedures  :  None  PUD Prophylaxis : None  DVT Prophylaxis  :  Coumadin  Lab Results  Component Value Date   INR 4.6 (HH) 02/27/2019   INR 4.1 (HH) 02/26/2019   INR 3.6 (H) 02/25/2019     Lab Results  Component Value Date   PLT 211 02/27/2019    Inpatient Medications  Scheduled Meds: . vitamin C  500 mg Oral Daily  . atorvastatin  10 mg Oral q1800  . brimonidine  1 drop Both Eyes BID  . [START ON 02/28/2019] dexamethasone (DECADRON) injection  4 mg Intravenous Q24H  . latanoprost  1 drop Both Eyes QHS  . levothyroxine  25 mcg Oral Daily  . loratadine  10 mg Oral Daily  . losartan  50 mg Oral Daily  . metoprolol tartrate  25 mg Oral BID  . sodium chloride flush  3 mL Intravenous Q12H  . Warfarin - Pharmacist Dosing Inpatient   Does not apply q1800  . zinc sulfate  220 mg Oral Daily   Continuous Infusions: . sodium chloride    . remdesivir 100 mg in NS 100 mL 100 mg (02/27/19 0927)   PRN Meds:.sodium chloride, acetaminophen, guaiFENesin-dextromethorphan, lip balm, ondansetron **OR** ondansetron  (ZOFRAN) IV, sodium chloride flush  Antibiotics  :    Anti-infectives (From admission, onward)   Start     Dose/Rate Route Frequency Ordered Stop   02/27/19 1000  remdesivir 100 mg in sodium chloride 0.9 % 100 mL IVPB     100 mg 200 mL/hr over 30 Minutes Intravenous Daily 02/26/19 0004 03/03/19 0959   02/27/19 1000  remdesivir 100 mg in sodium chloride 0.9 % 100 mL IVPB  Status:  Discontinued     100 mg 200 mL/hr over 30 Minutes Intravenous Daily 02/26/19 2100 02/26/19 2111   02/26/19 2300  remdesivir 100 mg in sodium chloride 0.9 % 100 mL IVPB  Status:  Discontinued     100 mg 200 mL/hr over 30 Minutes Intravenous Daily 02/25/19 2244 02/26/19 0003   02/26/19 2115  remdesivir 200 mg in sodium chloride 0.9% 250 mL IVPB  Status:  Discontinued     200 mg 580 mL/hr over 30 Minutes Intravenous Once 02/26/19 2100 02/26/19 2111   02/26/19 0030  remdesivir 100 mg in sodium chloride 0.9 % 100 mL IVPB     100 mg 200 mL/hr over 30 Minutes Intravenous Every 1 hr x 2 02/26/19 0004 02/26/19 0226   02/25/19 2300  remdesivir 200 mg  in sodium chloride 0.9% 250 mL IVPB  Status:  Discontinued     200 mg 580 mL/hr over 30 Minutes Intravenous Once 02/25/19 2244 02/26/19 0003       Time Spent in minutes  30   Lala Lund M.D on 02/27/2019 at 11:19 AM  To page go to [email or web address] - password [facility]  [facility] -  Office  [phone]  See all Orders from today for further details    Objective:   Vitals:   02/26/19 1813 02/26/19 1944 02/27/19 0409 02/27/19 0923  BP: 111/81 (!) 109/57 (!) 109/57 102/68  Pulse: 86 87 79 89  Resp: 19 (!) 23 (!) 21   Temp:  98.1 F (36.7 C) 98 F (36.7 C)   TempSrc:   Oral   SpO2: 95% 96% 95%     Wt Readings from Last 3 Encounters:  02/25/19 58.5 kg  01/29/19 58.5 kg  10/17/18 59 kg     Intake/Output Summary (Last 24 hours) at 02/27/2019 1119 Last data filed at 02/27/2019 0300 Gross per 24 hour  Intake 240 ml  Output 425 ml  Net -185 ml      Physical Exam  Awake Alert, No new F.N deficits, Normal affect [place].AT,PERRAL Supple Neck,No JVD, No cervical lymphadenopathy appriciated.  Symmetrical Chest wall movement, Good air movement bilaterally, CTAB RRR,No Gallops,Rubs or new Murmurs, No Parasternal Heave +ve B.Sounds, Abd Soft, No tenderness, No organomegaly appriciated, No rebound - guarding or rigidity. No Cyanosis, Clubbing or edema, No new Rash or bruise      Data Review:    CBC Recent Labs  Lab 02/25/19 1555 02/27/19 0400  WBC 7.5 10.3  HGB 11.8* 11.0*  HCT 35.5* 33.1*  PLT 170 211  MCV 91.3 91.7  MCH 30.3 30.5  MCHC 33.2 33.2  RDW 12.6 12.7  LYMPHSABS 0.6* 0.8  MONOABS 0.5 0.8  EOSABS 0.0 0.0  BASOSABS 0.0 0.0    Chemistries  Recent Labs  Lab 02/25/19 1555 02/27/19 0400  NA 129* 136  K 4.0 3.5  CL 95* 102  CO2 22 24  GLUCOSE 152* 117*  BUN 16 25*  CREATININE 0.87 0.77  CALCIUM 8.5* 8.2*  AST 33 37  ALT 17 27  ALKPHOS 59 53  BILITOT 1.1 1.0   ------------------------------------------------------------------------------------------------------------------ Recent Labs    02/25/19 2035  TRIG 60    No results found for: HGBA1C ------------------------------------------------------------------------------------------------------------------ No results for input(s): TSH, T4TOTAL, T3FREE, THYROIDAB in the last 72 hours.  Invalid input(s): FREET3  Cardiac Enzymes No results for input(s): CKMB, TROPONINI, MYOGLOBIN in the last 168 hours.  Invalid input(s): CK ------------------------------------------------------------------------------------------------------------------    Component Value Date/Time   BNP 46.9 03/23/2016 1651    Micro Results Recent Results (from the past 240 hour(s))  SARS Coronavirus 2 Ag (30 min TAT) - Nasal Swab (BD Veritor Kit)     Status: Abnormal   Collection Time: 02/25/19  3:46 PM   Specimen: Nasal Swab (BD Veritor Kit)  Result Value Ref Range  Status   SARS Coronavirus 2 Ag POSITIVE (A) NEGATIVE Final    Comment: RESULT CALLED TO, READ BACK BY AND VERIFIED WITH:  MARVA SIMMS _0  ON 02/25/2019, [email or web address] (NOTE) SARS-CoV-2 antigen PRESENT. Positive results indicate the presence of viral antigens, but clinical correlation with patient history and other diagnostic information is necessary to determine patient infection status.  Positive results do not rule out bacterial infection or co-infection  with other viruses. False positive results are rare but can occur, and confirmatory RT-PCR  testing may be appropriate in some circumstances. The expected result is Negative. Fact Sheet for Patients: PodPark.tn Fact Sheet for Providers: GiftContent.is  This test is not yet approved or cleared by the [place] FDA and  has been authorized for detection and/or diagnosis of SARS-CoV-2 by FDA under an Emergency Use Authorization (EUA).  This EUA will remain in effect (meaning this test can be used) for the duration of  the COVID- 19 declaration under Section 564(b)(1) of the Act, 21 U.S.C. section 360bbb-3(b)(1), unless the authorization is terminated or revoked sooner. Performed at [facility], [address] [place], [place] [address]   Blood Culture (routine x 2)     Status: None (Preliminary result)   Collection Time: 02/25/19  8:35 PM   Specimen: BLOOD  Result Value Ref Range Status   Specimen Description   Final    BLOOD RIGHT ANTECUBITAL Performed at [facility], [address] [place], [place] [address]    Special Requests   Final    BOTTLES DRAWN AEROBIC AND ANAEROBIC Blood Culture adequate volume Performed at [facility], [address] [place], [place] [address]    Culture   Final    NO GROWTH < 12 HOURS Performed at [facility], [place] [address] [place], [place] [address]    Report Status PENDING  Incomplete   Blood Culture (routine x 2)     Status: None (Preliminary result)   Collection Time: 02/25/19  9:00 PM   Specimen: BLOOD  Result Value Ref Range Status   Specimen Description   Final    BLOOD LEFT ANTECUBITAL Performed at [facility], [address] [place], [place] [address]    Special Requests   Final    BOTTLES DRAWN AEROBIC AND ANAEROBIC Blood Culture adequate volume Performed at [facility], [address] [place], [place] [address]    Culture   Final    NO GROWTH < 12 HOURS Performed at [facility], [place] [address] [place], [place] [address]    Report Status PENDING  Incomplete    Radiology Reports CT Angio Chest PE W and/or Wo Contrast  Result Date: 02/25/2019 CLINICAL DATA:  Shortness of breath. EXAM: CT ANGIOGRAPHY CHEST WITH CONTRAST TECHNIQUE: Multidetector CT imaging of the chest was performed using the standard protocol during bolus administration of intravenous contrast. Multiplanar CT image reconstructions and MIPs were obtained to evaluate the vascular anatomy. CONTRAST:  118m OMNIPAQUE IOHEXOL 350 MG/ML SOLN COMPARISON:  None. FINDINGS: Cardiovascular: Contrast injection is sufficient to demonstrate satisfactory opacification of the pulmonary arteries to the segmental level. There is no pulmonary embolus. The main pulmonary artery is dilated measuring approximately 3.4 cm in diameter. There is no CT evidence of acute right heart strain. There are atherosclerotic changes of the thoracic aorta. Heart size is enlarged. There is significant left atrial enlargement. Mitral valve calcifications are noted. Mediastinum/Nodes: --his likely reactive mediastinal and hilar adenopathy. --No axillary lymphadenopathy. --No supraclavicular lymphadenopathy. --Normal thyroid gland. --The esophagus is unremarkable Lungs/Pleura: There are diffuse bilateral ground-glass airspace opacities. There is a small right-sided pleural effusion with adjacent atelectasis.  There is a small amount of atelectasis at the left lung base. The trachea is unremarkable. There is no pneumothorax. Upper Abdomen: No acute abnormality. Musculoskeletal: No chest wall abnormality. No acute or significant osseous findings. Review of the MIP images confirms the above findings. IMPRESSION: 1. No evidence for pulmonary  embolus. 2. Diffuse bilateral ground-glass airspace opacities. This can be seen in patients with an atypical infectious process such as viral pneumonia or developing pulmonary edema. 3. Cardiomegaly with significant left atrial enlargement. 4. Small right-sided pleural effusion with adjacent atelectasis. 5. Enlarged main pulmonary artery, can be seen with pulmonary arterial hypertension. Aortic Atherosclerosis (ICD10-I70.0). Electronically Signed   By: Constance Holster M.D.   On: 02/25/2019 21:35   DG Chest Portable 1 View  Result Date: 02/25/2019 CLINICAL DATA:  Shortness of breath. Cough. Weakness. COVID-19. EXAM: PORTABLE CHEST 1 VIEW COMPARISON:  08/12/2018 and 08/11/2018 FINDINGS: The heart size and pulmonary vascularity are normal. Small focal area of infiltrate in the left upper lobe. Chronic slight accentuation of the interstitial markings as demonstrated on the prior chest CT. No acute bone abnormality. IMPRESSION: Small focal infiltrate in the left upper lobe. Mild chronic interstitial lung disease. Electronically Signed   By: Lorriane Shire M.D.   On: 02/25/2019 18:32

## 2019-02-27 NOTE — Progress Notes (Signed)
Rehab Admissions Coordinator Note:  Patient was screened by Michel Santee for appropriateness for an Inpatient Acute Rehab Consult.  At this time, therapy recommendations are for SNF.    Michel Santee 02/27/2019, 4:46 PM  I can be reached at [phone].

## 2019-02-27 NOTE — Progress Notes (Signed)
ANTICOAGULATION CONSULT NOTE - Initial Consult  Pharmacy Consult for Coumadin Indication: Factor V Leiden  Allergies  Allergen Reactions  . Codeine Nausea Only  . Erythromycin Nausea And Vomiting    Patient Measurements:    Vital Signs: Temp: 98 F (36.7 C) (01/06 0409) Temp Source: Oral (01/06 0409) BP: 109/57 (01/06 0409) Pulse Rate: 79 (01/06 0409)  Labs: Recent Labs    02/25/19 1555 02/25/19 2035 02/26/19 0500 02/27/19 0400  HGB 11.8*  --   --  11.0*  HCT 35.5*  --   --  33.1*  PLT 170  --   --  211  LABPROT  --  36.1* 39.8* 43.6*  INR  --  3.6* 4.1* 4.6*  CREATININE 0.87  --   --  0.77    Estimated Creatinine Clearance: 42.1 mL/min (by C-G formula based on SCr of 0.77 mg/dL).   Medical History: Past Medical History:  Diagnosis Date  . Allergic rhinitis   . Cough   . Difficulty in swallowing    w/ occasional aspiration  . DVT (deep venous thrombosis) ([place])   . Dyslipidemia   . Factor V Leiden deficiency    lifelong coumadin  . GERD (gastroesophageal reflux disease)   . History of nuclear stress test 12/28/2007   lexiscan; low risk   . HTN (hypertension)   . PND (post-nasal drip)   . Pulmonary embolism ([place])   . PVC's (premature ventricular contractions)   . Thyroid disease     Medications:  No current facility-administered medications on file prior to encounter.   Current Outpatient Medications on File Prior to Encounter  Medication Sig Dispense Refill  . acetaminophen (TYLENOL) 500 MG tablet Take 1,000 mg by mouth every 6 (six) hours as needed for moderate pain.    Marland Kitchen atorvastatin (LIPITOR) 10 MG tablet TAKE 1 TABLET (10 MG TOTAL) BY MOUTH DAILY AT 6 PM. 90 tablet 2  . azithromycin (ZITHROMAX) 250 MG tablet Take 2 pills today then one a day for 4 additional days. DX J40 (Patient not taking: Reported on 02/25/2019) 6 tablet 0  . brimonidine (ALPHAGAN) 0.15 % ophthalmic solution Place 1 drop into both eyes 2 (two) times daily.  3  .  Calcium-Phosphorus-Vitamin D (CALCIUM/D3 ADULT GUMMIES) 200-96.6-200 MG-MG-UNIT CHEW Chew 2 tablets by mouth daily.    . cetirizine (ZYRTEC) 10 MG tablet TAKE 1 TABLET BY MOUTH EVERY DAY 90 tablet 3  . chlorpheniramine (CHLOR-TRIMETON) 4 MG tablet Take 4 mg by mouth every morning.     . fluticasone (FLONASE) 50 MCG/ACT nasal spray Place 2 sprays into both nostrils daily. 16 g 2  . ipratropium (ATROVENT) 0.03 % nasal spray PLACE 2 SPRAYS INTO BOTH NOSTRILS 3 (THREE) TIMES DAILY AS NEEDED FOR RHINITIS. 30 mL 6  . latanoprost (XALATAN) 0.005 % ophthalmic solution Place 1 drop into both eyes at bedtime.     Marland Kitchen levothyroxine (SYNTHROID, LEVOTHROID) 25 MCG tablet TAKE 1 TABLET BY MOUTH EVERY DAY (Patient taking differently: Take 25 mcg by mouth daily before breakfast. ) 90 tablet 3  . losartan (COZAAR) 50 MG tablet Take 1 tablet (50 mg total) by mouth daily. 90 tablet 1  . metoprolol tartrate (LOPRESSOR) 25 MG tablet TAKE 1 TABLET (25 MG TOTAL) BY MOUTH 2 (TWO) TIMES DAILY. *NEEDS OFFICE VISIT FOR FURTHER REFILLS* 90 tablet 0  . multivitamin-iron-minerals-folic acid (CENTRUM) chewable tablet Chew 1 tablet by mouth daily.    . NON FORMULARY Peripheral neuropathy cream from [place]    . vitamin  E (VITAMIN E) 1000 UNIT capsule Take 1,000 Units by mouth daily.    Marland Kitchen warfarin (COUMADIN) 7.5 MG tablet TAKE 1/2 TO 1 TABLET BY MOUTH DAILY AS DIRECTED 90 tablet 1     Assessment: 86 y.o. female on Coumadin 3.75mg  daily exc 7.5mg  on MWF PTA for factor V Leiden deficiency. INR was elevated on admission and still trending up to 4.6. No doses have been given since admit. Hgb 11, plts wnl.  Goal of Therapy:  INR 2-3 Monitor platelets by anticoagulation protocol: Yes   Plan:  Continue holding Coumadin Monitor daily INR, CBC, s/s of bleed  Robin Harrell J 02/27/2019,7:58 AM

## 2019-02-28 ENCOUNTER — Ambulatory Visit: Payer: Medicare Other

## 2019-02-28 LAB — CBC WITH DIFFERENTIAL/PLATELET
Abs Immature Granulocytes: 0.11 10*3/uL — ABNORMAL HIGH (ref 0.00–0.07)
Basophils Absolute: 0 10*3/uL (ref 0.0–0.1)
Basophils Relative: 0 %
Eosinophils Absolute: 0 10*3/uL (ref 0.0–0.5)
Eosinophils Relative: 0 %
HCT: 30.8 % — ABNORMAL LOW (ref 36.0–46.0)
Hemoglobin: 10.1 g/dL — ABNORMAL LOW (ref 12.0–15.0)
Immature Granulocytes: 1 %
Lymphocytes Relative: 9 %
Lymphs Abs: 1 10*3/uL (ref 0.7–4.0)
MCH: 30.1 pg (ref 26.0–34.0)
MCHC: 32.8 g/dL (ref 30.0–36.0)
MCV: 91.9 fL (ref 80.0–100.0)
Monocytes Absolute: 0.8 10*3/uL (ref 0.1–1.0)
Monocytes Relative: 7 %
Neutro Abs: 9.4 10*3/uL — ABNORMAL HIGH (ref 1.7–7.7)
Neutrophils Relative %: 83 %
Platelets: 225 10*3/uL (ref 150–400)
RBC: 3.35 MIL/uL — ABNORMAL LOW (ref 3.87–5.11)
RDW: 13 % (ref 11.5–15.5)
WBC: 11.3 10*3/uL — ABNORMAL HIGH (ref 4.0–10.5)
nRBC: 0 % (ref 0.0–0.2)

## 2019-02-28 LAB — COMPREHENSIVE METABOLIC PANEL
ALT: 27 U/L (ref 0–44)
AST: 32 U/L (ref 15–41)
Albumin: 2.5 g/dL — ABNORMAL LOW (ref 3.5–5.0)
Alkaline Phosphatase: 53 U/L (ref 38–126)
Anion gap: 12 (ref 5–15)
BUN: 51 mg/dL — ABNORMAL HIGH (ref 8–23)
CO2: 19 mmol/L — ABNORMAL LOW (ref 22–32)
Calcium: 8.4 mg/dL — ABNORMAL LOW (ref 8.9–10.3)
Chloride: 102 mmol/L (ref 98–111)
Creatinine, Ser: 1.23 mg/dL — ABNORMAL HIGH (ref 0.44–1.00)
GFR calc Af Amer: 47 mL/min — ABNORMAL LOW (ref >60)
GFR calc non Af Amer: 41 mL/min — ABNORMAL LOW (ref >60)
Glucose, Bld: 123 mg/dL — ABNORMAL HIGH (ref 70–99)
Potassium: 4.2 mmol/L (ref 3.5–5.1)
Sodium: 133 mmol/L — ABNORMAL LOW (ref 135–145)
Total Bilirubin: 0.5 mg/dL (ref 0.3–1.2)
Total Protein: 5.7 g/dL — ABNORMAL LOW (ref 6.5–8.1)

## 2019-02-28 LAB — BRAIN NATRIURETIC PEPTIDE: B Natriuretic Peptide: 152.6 pg/mL — ABNORMAL HIGH (ref 0.0–100.0)

## 2019-02-28 LAB — PROTIME-INR
INR: 3.7 — ABNORMAL HIGH (ref 0.8–1.2)
Prothrombin Time: 36.8 seconds — ABNORMAL HIGH (ref 11.4–15.2)

## 2019-02-28 LAB — C-REACTIVE PROTEIN: CRP: 10 mg/dL — ABNORMAL HIGH (ref <1.0)

## 2019-02-28 LAB — MAGNESIUM: Magnesium: 2.2 mg/dL (ref 1.7–2.4)

## 2019-02-28 LAB — D-DIMER, QUANTITATIVE: D-Dimer, Quant: 0.43 ug/mL-FEU (ref 0.00–0.50)

## 2019-02-28 MED ORDER — SODIUM CHLORIDE 0.9 % IV SOLN: INTRAVENOUS | Status: AC

## 2019-02-28 MED ORDER — LACTATED RINGERS IV SOLN: INTRAVENOUS | Status: AC

## 2019-02-28 MED ORDER — ZOLPIDEM TARTRATE 5 MG PO TABS: 5.0000 mg | ORAL_TABLET | Freq: Every evening | ORAL | Status: DC | PRN

## 2019-02-28 MED ORDER — NON FORMULARY: 5.0000 mg | Freq: Every evening | Status: DC | PRN

## 2019-02-28 MED ORDER — MELATONIN 5 MG PO TABS
5.0000 mg | ORAL_TABLET | Freq: Every evening | ORAL | Status: DC | PRN
  Administered 2019-02-28 – 2019-03-03 (×4): 5 mg via ORAL
  Filled 2019-02-28 (×6): qty 1

## 2019-02-28 MED ORDER — DEXAMETHASONE SODIUM PHOSPHATE 4 MG/ML IJ SOLN
2.0000 mg | INTRAMUSCULAR | Status: DC
  Administered 2019-02-28: 2 mg via INTRAVENOUS
  Filled 2019-02-28: qty 1

## 2019-02-28 MED ORDER — WARFARIN SODIUM 2 MG PO TABS
2.0000 mg | ORAL_TABLET | Freq: Once | ORAL | Status: AC
  Administered 2019-02-28: 2 mg via ORAL
  Filled 2019-02-28: qty 1

## 2019-02-28 NOTE — Plan of Care (Signed)

## 2019-02-28 NOTE — Progress Notes (Signed)
ANTICOAGULATION CONSULT NOTE - Initial Consult  Pharmacy Consult for Coumadin Indication: Factor V Leiden  Allergies  Allergen Reactions  . Azithromycin Nausea Only  . Codeine Nausea Only  . Erythromycin Nausea And Vomiting    Patient Measurements: Height: 5\' 2"  (157.5 cm) Weight: 127 lb 6.8 oz (57.8 kg) IBW/kg (Calculated) : 50.1  Vital Signs: Temp: 97.5 F (36.4 C) (01/07 0751) Temp Source: Oral (01/07 0751) BP: 113/58 (01/07 0751) Pulse Rate: 85 (01/07 0751)  Labs: Recent Labs    02/25/19 1555 02/26/19 0500 02/27/19 0400 02/28/19 0034  HGB 11.8*  --  11.0* 10.1*  HCT 35.5*  --  33.1* 30.8*  PLT 170  --  211 225  LABPROT  --  39.8* 43.6* 36.8*  INR  --  4.1* 4.6* 3.7*  CREATININE 0.87  --  0.77 1.23*    Estimated Creatinine Clearance: 27.4 mL/min (A) (by C-G formula based on SCr of 1.23 mg/dL (H)).   Medical History: Past Medical History:  Diagnosis Date  . Allergic rhinitis   . Cough   . Difficulty in swallowing    w/ occasional aspiration  . DVT (deep venous thrombosis) ([name])   . Dyslipidemia   . Factor V Leiden deficiency    lifelong coumadin  . GERD (gastroesophageal reflux disease)   . History of nuclear stress test 12/28/2007   lexiscan; low risk   . HTN (hypertension)   . PND (post-nasal drip)   . Pulmonary embolism ([place])   . PVC's (premature ventricular contractions)   . Thyroid disease     Medications:  No current facility-administered medications on file prior to encounter.   Current Outpatient Medications on File Prior to Encounter  Medication Sig Dispense Refill  . acetaminophen (TYLENOL) 500 MG tablet Take 1,000 mg by mouth every 6 (six) hours as needed for moderate pain.    Marland Kitchen atorvastatin (LIPITOR) 10 MG tablet TAKE 1 TABLET (10 MG TOTAL) BY MOUTH DAILY AT 6 PM. (Patient taking differently: Take 10 mg by mouth at bedtime. ) 90 tablet 2  . brimonidine (ALPHAGAN) 0.15 % ophthalmic solution Place 2 drops into both eyes 2 (two) times  daily.   3  . Calcium-Phosphorus-Vitamin D (CALCIUM/D3 ADULT GUMMIES) 200-96.6-200 MG-MG-UNIT CHEW Chew 2 tablets by mouth daily.    . cetirizine (ZYRTEC) 10 MG tablet TAKE 1 TABLET BY MOUTH EVERY DAY (Patient taking differently: Take 10 mg by mouth daily. ) 90 tablet 3  . chlorpheniramine (CHLOR-TRIMETON) 4 MG tablet Take 4 mg by mouth every morning.     . fluticasone (FLONASE) 50 MCG/ACT nasal spray Place 2 sprays into both nostrils daily. 16 g 2  . ipratropium (ATROVENT) 0.03 % nasal spray PLACE 2 SPRAYS INTO BOTH NOSTRILS 3 (THREE) TIMES DAILY AS NEEDED FOR RHINITIS. 30 mL 6  . latanoprost (XALATAN) 0.005 % ophthalmic solution Place 1 drop into both eyes at bedtime.     Marland Kitchen levothyroxine (SYNTHROID, LEVOTHROID) 25 MCG tablet TAKE 1 TABLET BY MOUTH EVERY DAY (Patient taking differently: Take 25 mcg by mouth daily before breakfast. ) 90 tablet 3  . losartan (COZAAR) 50 MG tablet Take 1 tablet (50 mg total) by mouth daily. 90 tablet 1  . metoprolol tartrate (LOPRESSOR) 25 MG tablet TAKE 1 TABLET (25 MG TOTAL) BY MOUTH 2 (TWO) TIMES DAILY. *NEEDS OFFICE VISIT FOR FURTHER REFILLS* 90 tablet 0  . multivitamin-iron-minerals-folic acid (CENTRUM) chewable tablet Chew 1 tablet by mouth daily.    . vitamin E (VITAMIN E) 1000 UNIT capsule Take  1,000 Units by mouth daily.    Marland Kitchen warfarin (COUMADIN) 7.5 MG tablet TAKE 1/2 TO 1 TABLET BY MOUTH DAILY AS DIRECTED (Patient taking differently: Take 3.75-7.5 mg by mouth See admin instructions. Take 1/2 tablet Mon, Wed, Fri and 1 tablet all other days of the week.) 90 tablet 1  . azithromycin (ZITHROMAX) 250 MG tablet Take 2 pills today then one a day for 4 additional days. DX J40 (Patient not taking: Reported on 02/25/2019) 6 tablet 0     Assessment: 86 y.o. female On Coumadin 3.75mg  daily exc 7.5mg  on MWF PTA for factor V Leiden deficiency. INR now down to 3.7. Would expect INR to continue trending down so will start lower dose tonight. Hgb down to 10.1, plts wnl.    Goal of Therapy:  INR 2-3 Monitor platelets by anticoagulation protocol: Yes   Plan:  Give Coumadin 2mg  PO x 1 tonight Monitor daily INR, CBC, s/s of bleed  Elenor Quinones, PharmD, BCPS, BCIDP Clinical Pharmacist 02/28/2019 8:33 AM

## 2019-02-28 NOTE — Plan of Care (Signed)
Patient remains asymptomatic from covid-19, on room air, main focus now is patient mobility, after PT session, Snf or Rehab would best for patient, but she remains reluctant, did have bowel movement overnight, requesting something to help her sleep, Melatonin 5 ordered and given patient states she still cant sleep, safety precautions maintained, continue plan of care Problem: Education: Goal: Knowledge of risk factors and measures for prevention of condition will improve 02/28/2019 0309 by Girtha Rm, RN Outcome: Progressing 02/28/2019 0306 by Girtha Rm, RN Outcome: Progressing   Problem: Coping: Goal: Psychosocial and spiritual needs will be supported 02/28/2019 0309 by Girtha Rm, RN Outcome: Progressing 02/28/2019 0306 by Girtha Rm, RN Outcome: Progressing   Problem: Respiratory: Goal: Will maintain a patent airway 02/28/2019 0309 by Girtha Rm, RN Outcome: Progressing 02/28/2019 0306 by Girtha Rm, RN Outcome: Progressing Goal: Complications related to the disease process, condition or treatment will be avoided or minimized 02/28/2019 0309 by Girtha Rm, RN Outcome: Progressing 02/28/2019 0306 by Girtha Rm, RN Outcome: Progressing   Problem: Education: Goal: Knowledge of General Education information will improve Description: Including pain rating scale, medication(s)/side effects and non-pharmacologic comfort measures 02/28/2019 0309 by Girtha Rm, RN Outcome: Progressing 02/28/2019 0306 by Girtha Rm, RN Outcome: Progressing   Problem: Health Behavior/Discharge Planning: Goal: Ability to manage health-related needs will improve 02/28/2019 0309 by Girtha Rm, RN Outcome: Progressing 02/28/2019 0306 by Girtha Rm, RN Outcome: Progressing   Problem: Clinical Measurements: Goal: Ability to maintain clinical measurements within normal limits will improve 02/28/2019 0309 by Girtha Rm, RN Outcome:  Progressing 02/28/2019 0306 by Girtha Rm, RN Outcome: Progressing Goal: Will remain free from infection 02/28/2019 0309 by Girtha Rm, RN Outcome: Progressing 02/28/2019 0306 by Girtha Rm, RN Outcome: Progressing Goal: Diagnostic test results will improve 02/28/2019 0309 by Girtha Rm, RN Outcome: Progressing 02/28/2019 0306 by Girtha Rm, RN Outcome: Progressing Goal: Respiratory complications will improve 02/28/2019 0309 by Girtha Rm, RN Outcome: Progressing 02/28/2019 0306 by Girtha Rm, RN Outcome: Progressing Goal: Cardiovascular complication will be avoided 02/28/2019 0309 by Girtha Rm, RN Outcome: Progressing 02/28/2019 0306 by Girtha Rm, RN Outcome: Progressing   Problem: Activity: Goal: Risk for activity intolerance will decrease 02/28/2019 0309 by Girtha Rm, RN Outcome: Progressing 02/28/2019 0306 by Girtha Rm, RN Outcome: Progressing   Problem: Nutrition: Goal: Adequate nutrition will be maintained 02/28/2019 0309 by Girtha Rm, RN Outcome: Progressing 02/28/2019 0306 by Girtha Rm, RN Outcome: Progressing   Problem: Coping: Goal: Level of anxiety will decrease 02/28/2019 0309 by Girtha Rm, RN Outcome: Progressing 02/28/2019 0306 by Girtha Rm, RN Outcome: Progressing   Problem: Elimination: Goal: Will not experience complications related to bowel motility 02/28/2019 0309 by Girtha Rm, RN Outcome: Progressing 02/28/2019 0306 by Girtha Rm, RN Outcome: Progressing Goal: Will not experience complications related to urinary retention 02/28/2019 0309 by Girtha Rm, RN Outcome: Progressing 02/28/2019 0306 by Girtha Rm, RN Outcome: Progressing   Problem: Pain Managment: Goal: General experience of comfort will improve 02/28/2019 0309 by Girtha Rm, RN Outcome: Progressing 02/28/2019 0306 by Girtha Rm, RN Outcome: Progressing   Problem: Safety: Goal:  Ability to remain free from injury will improve 02/28/2019 0309 by Girtha Rm, RN Outcome: Progressing 02/28/2019 0306 by Girtha Rm, RN Outcome: Progressing   Problem: Skin Integrity: Goal: Risk for impaired skin integrity  will decrease 02/28/2019 0309 by Girtha Rm, RN Outcome: Progressing 02/28/2019 0306 by Girtha Rm, RN Outcome: Progressing

## 2019-02-28 NOTE — Progress Notes (Signed)
PROGRESS NOTE                                                                                                                                                                                                             Patient Demographics:    Robin Harrell, is a 86 y.o. female, DOB - [birth year], TJQ:[id number]  Outpatient Primary MD for the patient is [name], MD    LOS - 3  Admit date - 02/25/2019    Chief Complaint  Patient presents with  . Cough    + covid       Brief Narrative   Robin Harrell is a 86 y.o. 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  She has been sick over the last week, he was diagnosed with COVID-19 pneumonia and admitted to the hospital.   Subjective:   Patient in bed, appears comfortable, denies any headache, no fever, no chest pain or pressure, no shortness of breath , no abdominal pain. No focal weakness.   Assessment  & Plan :     1.  Acute Covid 19 Viral Pneumonitis during the ongoing 2020 Covid 19 Pandemic - she has mild to moderate disease, has been placed on IV steroids and remdesivir, her overall issue appears to be extreme weakness and deconditioning due to advanced age.  Will encourage to increase activity, sit up in chair, PT OT evaluation, patient has no help at home and may require SNF.  Encouraged the patient to sit up in chair in the daytime use I-S and flutter valve for pulmonary toiletry and then prone in bed when at night.  SpO2: 98 % O2 Flow Rate (L/min): 2 L/min  Recent Labs  Lab 02/25/19 2035 02/27/19 0400 02/28/19 0034  CRP 23.9* 17.5* 10.0*  DDIMER 1.05* 0.68* 0.43  FERRITIN 550*  --   --   BNP  --   --  152.6*  PROCALCITON <0.10  --   --     Hepatic Function Latest Ref Rng & Units 02/28/2019 02/27/2019 02/25/2019  Total  Protein 6.5 - 8.1 g/dL 5.7(L) 6.4(L) 7.3  Albumin 3.5 - 5.0 g/dL 2.5(L) 2.9(L) 3.4(L)  AST 15 - 41 U/L 32 37 33  ALT 0 -  44 U/L _0 Alk Phosphatase 38 - 126 U/L 53 53 59  Total Bilirubin 0.3 - 1.2 mg/dL 0.5 1.0 1.1  Bilirubin, Direct 0.0 - 0.3 mg/dL - - -    2.  History of DVT factor V Leyden deficiency.  On Coumadin. Pharmacy monitoring INR.  3.  Chronic idiopathic thrombocytopenia.  Stable no acute issues.   4.  Essential hypertension.  On combination of beta-blocker, ARB.  Monitor and adjust.  5.  Dehydration, Hyponatremia, severe weakness and deconditioning.  PT, OT.  May require SNF.  IVF repeat.  6.  Chronic diastolic CHF.  60% in 2018.  Compensated.   Condition - Fair  Family Communication  :  Daughter 02/27/19  Code Status :  Full  Diet :   Diet Order            Diet regular Room service appropriate? Yes; Fluid consistency: Thin  Diet effective now               Disposition Plan  :  TBD  Consults  :  None  Procedures  :  None  PUD Prophylaxis : None  DVT Prophylaxis  :  Coumadin  Lab Results  Component Value Date   INR 3.7 (H) 02/28/2019   INR 4.6 (HH) 02/27/2019   INR 4.1 (HH) 02/26/2019     Lab Results  Component Value Date   PLT 225 02/28/2019    Inpatient Medications  Scheduled Meds: . vitamin C  500 mg Oral Daily  . atorvastatin  10 mg Oral q1800  . brimonidine  1 drop Both Eyes BID  . dexamethasone (DECADRON) injection  2 mg Intravenous Q24H  . feeding supplement (ENSURE ENLIVE)  237 mL Oral TID BM  . latanoprost  1 drop Both Eyes QHS  . levothyroxine  25 mcg Oral Daily  . loratadine  10 mg Oral Daily  . losartan  50 mg Oral Daily  . metoprolol tartrate  25 mg Oral BID  . warfarin  2 mg Oral ONCE-1800  . Warfarin - Pharmacist Dosing Inpatient   Does not apply q1800  . zinc sulfate  220 mg Oral Daily   Continuous Infusions: . lactated ringers 100 mL/hr at 02/28/19 0855  . remdesivir 100 mg in NS 100 mL 100 mg (02/28/19  0858)   PRN Meds:.acetaminophen, guaiFENesin-dextromethorphan, lip balm, Melatonin, [DISCONTINUED] ondansetron **OR** ondansetron (ZOFRAN) IV, zolpidem  Antibiotics  :    Anti-infectives (From admission, onward)   Start     Dose/Rate Route Frequency Ordered Stop   02/27/19 1000  remdesivir 100 mg in sodium chloride 0.9 % 100 mL IVPB     100 mg 200 mL/hr over 30 Minutes Intravenous Daily 02/26/19 0004 03/03/19 0959   02/27/19 1000  remdesivir 100 mg in sodium chloride 0.9 % 100 mL IVPB  Status:  Discontinued     100 mg 200 mL/hr over 30 Minutes Intravenous Daily 02/26/19 2100 02/26/19 2111   02/26/19 2300  remdesivir 100 mg in sodium chloride 0.9 % 100 mL IVPB  Status:  Discontinued     100 mg 200 mL/hr over 30 Minutes Intravenous Daily 02/25/19 2244 02/26/19 0003   02/26/19 2115  remdesivir 200 mg in sodium chloride 0.9% 250 mL IVPB  Status:  Discontinued     200 mg 580 mL/hr over 30 Minutes Intravenous Once 02/26/19 2100 02/26/19 2111   02/26/19 0030  remdesivir 100 mg in sodium chloride 0.9 % 100 mL IVPB     100  mg 200 mL/hr over 30 Minutes Intravenous Every 1 hr x 2 02/26/19 0004 02/26/19 0226   02/25/19 2300  remdesivir 200 mg in sodium chloride 0.9% 250 mL IVPB  Status:  Discontinued     200 mg 580 mL/hr over 30 Minutes Intravenous Once 02/25/19 2244 02/26/19 0003       Time Spent in minutes  30   Lala Lund M.D on 02/28/2019 at 10:07 AM  To page go to [email or web address] - password [facility]  [facility] -  Office  [phone]  See all Orders from today for further details    Objective:   Vitals:   02/27/19 1949 02/28/19 0100 02/28/19 0400 02/28/19 0751  BP:   (!) 113/58 (!) 113/58  Pulse:   74 85  Resp: _0 Temp:   97.8 F (36.6 C) (!) 97.5 F (36.4 C)  TempSrc:   Oral Oral  SpO2:   99% 98%  Weight:  57.8 kg    Height:  5' 2" (1.575 m)      Wt Readings from Last 3 Encounters:  02/28/19 57.8 kg  02/25/19 58.5 kg  01/29/19 58.5 kg      Intake/Output Summary (Last 24 hours) at 02/28/2019 1007 Last data filed at 02/28/2019 0600 Gross per 24 hour  Intake 1470 ml  Output 526 ml  Net 944 ml     Physical Exam  Awake Alert, No new F.N deficits, Normal affect [place].AT,PERRAL Supple Neck,No JVD, No cervical lymphadenopathy appriciated.  Symmetrical Chest wall movement, Good air movement bilaterally, CTAB RRR,No Gallops, Rubs or new Murmurs, No Parasternal Heave +ve B.Sounds, Abd Soft, No tenderness, No organomegaly appriciated, No rebound - guarding or rigidity. No Cyanosis, Clubbing or edema, No new Rash or bruise    Data Review:    CBC Recent Labs  Lab 02/25/19 1555 02/27/19 0400 02/28/19 0034  WBC 7.5 10.3 11.3*  HGB 11.8* 11.0* 10.1*  HCT 35.5* 33.1* 30.8*  PLT 170 211 225  MCV 91.3 91.7 91.9  MCH 30.3 30.5 30.1  MCHC 33.2 33.2 32.8  RDW 12.6 12.7 13.0  LYMPHSABS 0.6* 0.8 1.0  MONOABS 0.5 0.8 0.8  EOSABS 0.0 0.0 0.0  BASOSABS 0.0 0.0 0.0    Chemistries  Recent Labs  Lab 02/25/19 1555 02/27/19 0400 02/28/19 0034  NA 129* 136 133*  K 4.0 3.5 4.2  CL 95* 102 102  CO2 22 24 19*  GLUCOSE 152* 117* 123*  BUN 16 25* 51*  CREATININE 0.87 0.77 1.23*  CALCIUM 8.5* 8.2* 8.4*  MG  --   --  2.2  AST 33 37 32  ALT _1 ALKPHOS 59 53 53  BILITOT 1.1 1.0 0.5   ------------------------------------------------------------------------------------------------------------------ Recent Labs    02/25/19 2035  TRIG 60    No results found for: HGBA1C ------------------------------------------------------------------------------------------------------------------ No results for input(s): TSH, T4TOTAL, T3FREE, THYROIDAB in the last 72 hours.  Invalid input(s): FREET3  Cardiac Enzymes No results for input(s): CKMB, TROPONINI, MYOGLOBIN in the last 168 hours.  Invalid input(s): CK ------------------------------------------------------------------------------------------------------------------     Component Value Date/Time   BNP 152.6 (H) 02/28/2019 0034    Micro Results Recent Results (from the past 240 hour(s))  SARS Coronavirus 2 Ag (30 min TAT) - Nasal Swab (BD Veritor Kit)     Status: Abnormal   Collection Time: 02/25/19  3:46 PM   Specimen: Nasal Swab (BD Veritor Kit)  Result Value Ref Range Status   SARS Coronavirus 2 Ag POSITIVE (A) NEGATIVE  Final    Comment: RESULT CALLED TO, READ BACK BY AND VERIFIED WITH:  MARVA SIMMS _0  ON 02/25/2019, [email or web address] (NOTE) SARS-CoV-2 antigen PRESENT. Positive results indicate the presence of viral antigens, but clinical correlation with patient history and other diagnostic information is necessary to determine patient infection status.  Positive results do not rule out bacterial infection or co-infection  with other viruses. False positive results are rare but can occur, and confirmatory RT-PCR testing may be appropriate in some circumstances. The expected result is Negative. Fact Sheet for Patients: PodPark.tn Fact Sheet for Providers: GiftContent.is  This test is not yet approved or cleared by the [place] FDA and  has been authorized for detection and/or diagnosis of SARS-CoV-2 by FDA under an Emergency Use Authorization (EUA).  This EUA will remain in effect (meaning this test can be used) for the duration of  the COVID- 19 declaration under Section 564(b)(1) of the Act, 21 U.S.C. section 360bbb-3(b)(1), unless the authorization is terminated or revoked sooner. Performed at [facility], [address] [place], [place] [address]   Blood Culture (routine x 2)     Status: None (Preliminary result)   Collection Time: 02/25/19  8:35 PM   Specimen: BLOOD  Result Value Ref Range Status   Specimen Description   Final    BLOOD RIGHT ANTECUBITAL Performed at [facility], [address] [place], [place] [address]    Special Requests    Final    BOTTLES DRAWN AEROBIC AND ANAEROBIC Blood Culture adequate volume Performed at [facility], [address] [place], [place] [address]    Culture   Final    NO GROWTH 2 DAYS Performed at [facility], [place] [address], [place], [place] [address]    Report Status PENDING  Incomplete  Blood Culture (routine x 2)     Status: None (Preliminary result)   Collection Time: 02/25/19  9:00 PM   Specimen: BLOOD  Result Value Ref Range Status   Specimen Description   Final    BLOOD LEFT ANTECUBITAL Performed at [facility], [address] [place], [place] [address]    Special Requests   Final    BOTTLES DRAWN AEROBIC AND ANAEROBIC Blood Culture adequate volume Performed at [facility], [address] [place], [place] [address]    Culture   Final    NO GROWTH 2 DAYS Performed at [facility], [place] [address], [place], [place] [address]    Report Status PENDING  Incomplete    Radiology Reports CT Angio Chest PE W and/or Wo Contrast  Result Date: 02/25/2019 CLINICAL DATA:  Shortness of breath. EXAM: CT ANGIOGRAPHY CHEST WITH CONTRAST TECHNIQUE: Multidetector CT imaging of the chest was performed using the standard protocol during bolus administration of intravenous contrast. Multiplanar CT image reconstructions and MIPs were obtained to evaluate the vascular anatomy. CONTRAST:  155m OMNIPAQUE IOHEXOL 350 MG/ML SOLN COMPARISON:  None. FINDINGS: Cardiovascular: Contrast injection is sufficient to demonstrate satisfactory opacification of the pulmonary arteries to the segmental level. There is no pulmonary embolus. The main pulmonary artery is dilated measuring approximately 3.4 cm in diameter. There is no CT evidence of acute right heart strain. There are atherosclerotic changes of the thoracic aorta. Heart size is enlarged. There is significant left atrial enlargement. Mitral valve calcifications are noted. Mediastinum/Nodes: --his likely  reactive mediastinal and hilar adenopathy. --No axillary lymphadenopathy. --No supraclavicular lymphadenopathy. --Normal thyroid  gland. --The esophagus is unremarkable Lungs/Pleura: There are diffuse bilateral ground-glass airspace opacities. There is a small right-sided pleural effusion with adjacent atelectasis. There is a small amount of atelectasis at the left lung base. The trachea is unremarkable. There is no pneumothorax. Upper Abdomen: No acute abnormality. Musculoskeletal: No chest wall abnormality. No acute or significant osseous findings. Review of the MIP images confirms the above findings. IMPRESSION: 1. No evidence for pulmonary embolus. 2. Diffuse bilateral ground-glass airspace opacities. This can be seen in patients with an atypical infectious process such as viral pneumonia or developing pulmonary edema. 3. Cardiomegaly with significant left atrial enlargement. 4. Small right-sided pleural effusion with adjacent atelectasis. 5. Enlarged main pulmonary artery, can be seen with pulmonary arterial hypertension. Aortic Atherosclerosis (ICD10-I70.0). Electronically Signed   By: Constance Holster M.D.   On: 02/25/2019 21:35   DG Chest Portable 1 View  Result Date: 02/25/2019 CLINICAL DATA:  Shortness of breath. Cough. Weakness. COVID-19. EXAM: PORTABLE CHEST 1 VIEW COMPARISON:  08/12/2018 and 08/11/2018 FINDINGS: The heart size and pulmonary vascularity are normal. Small focal area of infiltrate in the left upper lobe. Chronic slight accentuation of the interstitial markings as demonstrated on the prior chest CT. No acute bone abnormality. IMPRESSION: Small focal infiltrate in the left upper lobe. Mild chronic interstitial lung disease. Electronically Signed   By: Lorriane Shire M.D.   On: 02/25/2019 18:32

## 2019-03-01 LAB — COMPREHENSIVE METABOLIC PANEL
ALT: 26 U/L (ref 0–44)
AST: 31 U/L (ref 15–41)
Albumin: 2.4 g/dL — ABNORMAL LOW (ref 3.5–5.0)
Alkaline Phosphatase: 48 U/L (ref 38–126)
Anion gap: 8 (ref 5–15)
BUN: 37 mg/dL — ABNORMAL HIGH (ref 8–23)
CO2: 23 mmol/L (ref 22–32)
Calcium: 8.3 mg/dL — ABNORMAL LOW (ref 8.9–10.3)
Chloride: 104 mmol/L (ref 98–111)
Creatinine, Ser: 0.98 mg/dL (ref 0.44–1.00)
GFR calc Af Amer: >60 mL/min (ref >60)
GFR calc non Af Amer: 53 mL/min — ABNORMAL LOW (ref >60)
Glucose, Bld: 84 mg/dL (ref 70–99)
Potassium: 4.1 mmol/L (ref 3.5–5.1)
Sodium: 135 mmol/L (ref 135–145)
Total Bilirubin: 0.4 mg/dL (ref 0.3–1.2)
Total Protein: 5.6 g/dL — ABNORMAL LOW (ref 6.5–8.1)

## 2019-03-01 LAB — CBC WITH DIFFERENTIAL/PLATELET
Abs Immature Granulocytes: 0.16 10*3/uL — ABNORMAL HIGH (ref 0.00–0.07)
Basophils Absolute: 0 10*3/uL (ref 0.0–0.1)
Basophils Relative: 1 %
Eosinophils Absolute: 0 10*3/uL (ref 0.0–0.5)
Eosinophils Relative: 0 %
HCT: 31 % — ABNORMAL LOW (ref 36.0–46.0)
Hemoglobin: 10.3 g/dL — ABNORMAL LOW (ref 12.0–15.0)
Immature Granulocytes: 2 %
Lymphocytes Relative: 19 %
Lymphs Abs: 1.5 10*3/uL (ref 0.7–4.0)
MCH: 30.8 pg (ref 26.0–34.0)
MCHC: 33.2 g/dL (ref 30.0–36.0)
MCV: 92.8 fL (ref 80.0–100.0)
Monocytes Absolute: 0.7 10*3/uL (ref 0.1–1.0)
Monocytes Relative: 9 %
Neutro Abs: 5.6 10*3/uL (ref 1.7–7.7)
Neutrophils Relative %: 69 %
Platelets: 230 10*3/uL (ref 150–400)
RBC: 3.34 MIL/uL — ABNORMAL LOW (ref 3.87–5.11)
RDW: 13.2 % (ref 11.5–15.5)
WBC: 8.1 10*3/uL (ref 4.0–10.5)
nRBC: 0 % (ref 0.0–0.2)

## 2019-03-01 LAB — PROTIME-INR
INR: 2.4 — ABNORMAL HIGH (ref 0.8–1.2)
Prothrombin Time: 26.2 seconds — ABNORMAL HIGH (ref 11.4–15.2)

## 2019-03-01 LAB — BRAIN NATRIURETIC PEPTIDE: B Natriuretic Peptide: 152.5 pg/mL — ABNORMAL HIGH (ref 0.0–100.0)

## 2019-03-01 LAB — MAGNESIUM: Magnesium: 2.1 mg/dL (ref 1.7–2.4)

## 2019-03-01 LAB — D-DIMER, QUANTITATIVE: D-Dimer, Quant: 0.37 ug/mL-FEU (ref 0.00–0.50)

## 2019-03-01 LAB — C-REACTIVE PROTEIN: CRP: 6.1 mg/dL — ABNORMAL HIGH (ref <1.0)

## 2019-03-01 MED ORDER — DEXAMETHASONE SODIUM PHOSPHATE 4 MG/ML IJ SOLN
1.0000 mg | INTRAMUSCULAR | Status: AC
  Administered 2019-03-01 – 2019-03-02 (×2): 1 mg via INTRAVENOUS
  Filled 2019-03-01 (×2): qty 1

## 2019-03-01 MED ORDER — WARFARIN SODIUM 7.5 MG PO TABS
7.5000 mg | ORAL_TABLET | Freq: Once | ORAL | Status: AC
  Administered 2019-03-01: 7.5 mg via ORAL
  Filled 2019-03-01: qty 1

## 2019-03-01 NOTE — Progress Notes (Signed)
PROGRESS NOTE                                                                                                                                                                                                             Patient Demographics:    Robin Harrell, is a 86 y.o. female, DOB - June 06, 1933, [id number]  Outpatient Primary MD for the patient is [name], MD    LOS - 4  Admit date - 02/25/2019    Chief Complaint  Patient presents with  . Cough    + covid       Brief Narrative   Robin Harrell is a 86 y.o. 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  She has been sick over the last week, he was diagnosed with COVID-19 pneumonia and admitted to the hospital.   Subjective:   Patient in bed, appears comfortable, denies any headache, no fever, no chest pain or pressure, no shortness of breath , no abdominal pain. No focal weakness.   Assessment  & Plan :     1.  Acute Covid 19 Viral Pneumonitis during the ongoing 2020 Covid 19 Pandemic - she has mild to moderate disease, has been placed on IV steroids and remdesivir, her overall issue appears to be extreme weakness and deconditioning due to advanced age.  Will encourage to increase activity, sit up in chair, PT OT evaluation, patient has no help at home and may require SNF.  Currently on room air at rest and upon ambulation on 2 L weight continue to titrate down oxygen and titrate down steroids.  Encouraged the patient to sit up in chair in the daytime use I-S and flutter valve for pulmonary toiletry and then prone in bed when at night.  SpO2: 95 % O2 Flow Rate (L/min): 2 L/min  Recent Labs  Lab 02/25/19 2035 02/27/19 0400 02/28/19 0034 03/01/19 0206  CRP 23.9* 17.5* 10.0* 6.1*  DDIMER 1.05* 0.68* 0.43 0.37  FERRITIN  550*  --   --   --   BNP  --   --  152.6* 152.5*  PROCALCITON <0.10  --   --   --     Hepatic Function Latest Ref Rng & Units 03/01/2019  02/28/2019 02/27/2019  Total Protein 6.5 - 8.1 g/dL 5.6(L) 5.7(L) 6.4(L)  Albumin 3.5 - 5.0 g/dL 2.4(L) 2.5(L) 2.9(L)  AST 15 - 41 U/L 31 32 37  ALT 0 - 44 U/L '26 27 27  '$ Alk Phosphatase 38 - 126 U/L 48 53 53  Total Bilirubin 0.3 - 1.2 mg/dL 0.4 0.5 1.0  Bilirubin, Direct 0.0 - 0.3 mg/dL - - -    2.  History of DVT factor V Leyden deficiency.  On Coumadin. Pharmacy monitoring INR.  3.  Chronic idiopathic thrombocytopenia.  Stable no acute issues.   4.  Essential hypertension.  On combination of beta-blocker, ARB.  Monitor and adjust.  5.  Dehydration, Hyponatremia, severe weakness and deconditioning.  PT, OT.  May require SNF.  IV fluids were repeated on 02/28/2019 with good response.  6.  Chronic diastolic CHF.  60% in 2018.  Compensated.   Condition - Fair  Family Communication  :  Daughter 02/27/19  Code Status :  Full  Diet :   Diet Order            DIET SOFT Room service appropriate? Yes; Fluid consistency: Thin  Diet effective now               Disposition Plan  : SNF when bed available  Consults  :  None  Procedures  :  None  PUD Prophylaxis : None  DVT Prophylaxis  :  Coumadin  Lab Results  Component Value Date   INR 2.4 (H) 03/01/2019   INR 3.7 (H) 02/28/2019   INR 4.6 (HH) 02/27/2019     Lab Results  Component Value Date   PLT 230 03/01/2019    Inpatient Medications  Scheduled Meds: . vitamin C  500 mg Oral Daily  . atorvastatin  10 mg Oral q1800  . brimonidine  1 drop Both Eyes BID  . dexamethasone (DECADRON) injection  1 mg Intravenous Q24H  . feeding supplement (ENSURE ENLIVE)  237 mL Oral TID BM  . latanoprost  1 drop Both Eyes QHS  . levothyroxine  25 mcg Oral Daily  . loratadine  10 mg Oral Daily  . losartan  50 mg Oral Daily  . metoprolol tartrate  25 mg Oral BID  . warfarin  7.5 mg Oral ONCE-1800    . Warfarin - Pharmacist Dosing Inpatient   Does not apply q1800  . zinc sulfate  220 mg Oral Daily   Continuous Infusions: . remdesivir 100 mg in NS 100 mL 100 mg (02/28/19 0858)   PRN Meds:.acetaminophen, guaiFENesin-dextromethorphan, lip balm, Melatonin, [DISCONTINUED] ondansetron **OR** ondansetron (ZOFRAN) IV, zolpidem  Antibiotics  :    Anti-infectives (From admission, onward)   Start     Dose/Rate Route Frequency Ordered Stop   02/27/19 1000  remdesivir 100 mg in sodium chloride 0.9 % 100 mL IVPB     100 mg 200 mL/hr over 30 Minutes Intravenous Daily 02/26/19 0004 03/03/19 0959   02/27/19 1000  remdesivir 100 mg in sodium chloride 0.9 % 100 mL IVPB  Status:  Discontinued     100 mg 200 mL/hr over 30 Minutes Intravenous Daily 02/26/19 2100 02/26/19 2111   02/26/19 2300  remdesivir 100 mg in sodium chloride 0.9 % 100 mL IVPB  Status:  Discontinued     100 mg 200 mL/hr over 30 Minutes Intravenous Daily 02/25/19 2244 02/26/19 0003   02/26/19 2115  remdesivir 200 mg in sodium chloride 0.9% 250 mL IVPB  Status:  Discontinued  200 mg 580 mL/hr over 30 Minutes Intravenous Once 02/26/19 2100 02/26/19 2111   02/26/19 0030  remdesivir 100 mg in sodium chloride 0.9 % 100 mL IVPB     100 mg 200 mL/hr over 30 Minutes Intravenous Every 1 hr x 2 02/26/19 0004 02/26/19 0226   02/25/19 2300  remdesivir 200 mg in sodium chloride 0.9% 250 mL IVPB  Status:  Discontinued     200 mg 580 mL/hr over 30 Minutes Intravenous Once 02/25/19 2244 02/26/19 0003       Time Spent in minutes  30   Lala Lund M.D on 03/01/2019 at 8:47 AM  To page go to [email or web address] - password [facility]  [facility] -  Office  [phone]  See all Orders from today for further details    Objective:   Vitals:   02/28/19 1633 02/28/19 2032 03/01/19 0326 03/01/19 0800  BP: 109/60 (!) 117/51 123/65 122/70  Pulse: 89 92 98 85  Resp: 18 (!) 21 (!) 22 18  Temp: 98 F (36.7 C) 98.4 F (36.9 C) 98.1 F (36.7  C) 98.1 F (36.7 C)  TempSrc: Oral Oral Oral Oral  SpO2: 98% 95% 100% 95%  Weight:      Height:        Wt Readings from Last 3 Encounters:  02/28/19 57.8 kg  02/25/19 58.5 kg  01/29/19 58.5 kg     Intake/Output Summary (Last 24 hours) at 03/01/2019 0847 Last data filed at 03/01/2019 0700 Gross per 24 hour  Intake 1366.41 ml  Output 326 ml  Net 1040.41 ml     Physical Exam  Awake Alert,  No new F.N deficits, Normal affect [place].AT,PERRAL Supple Neck,No JVD, No cervical lymphadenopathy appriciated.  Symmetrical Chest wall movement, Good air movement bilaterally, CTAB RRR,No Gallops, Rubs or new Murmurs, No Parasternal Heave +ve B.Sounds, Abd Soft, No tenderness, No organomegaly appriciated, No rebound - guarding or rigidity. No Cyanosis, Clubbing or edema, No new Rash or bruise     Data Review:    CBC Recent Labs  Lab 02/25/19 1555 02/27/19 0400 02/28/19 0034 03/01/19 0206  WBC 7.5 10.3 11.3* 8.1  HGB 11.8* 11.0* 10.1* 10.3*  HCT 35.5* 33.1* 30.8* 31.0*  PLT 170 211 225 230  MCV 91.3 91.7 91.9 92.8  MCH 30.3 30.5 30.1 30.8  MCHC 33.2 33.2 32.8 33.2  RDW 12.6 12.7 13.0 13.2  LYMPHSABS 0.6* 0.8 1.0 1.5  MONOABS 0.5 0.8 0.8 0.7  EOSABS 0.0 0.0 0.0 0.0  BASOSABS 0.0 0.0 0.0 0.0    Chemistries  Recent Labs  Lab 02/25/19 1555 02/27/19 0400 02/28/19 0034 03/01/19 0206  NA 129* 136 133* 135  K 4.0 3.5 4.2 4.1  CL 95* 102 102 104  CO2 22 24 19* 23  GLUCOSE 152* 117* 123* 84  BUN 16 25* 51* 37*  CREATININE 0.87 0.77 1.23* 0.98  CALCIUM 8.5* 8.2* 8.4* 8.3*  MG  --   --  2.2 2.1  AST 33 37 32 31  ALT '17 27 27 26  '$ ALKPHOS 59 53 53 48  BILITOT 1.1 1.0 0.5 0.4   ------------------------------------------------------------------------------------------------------------------ No results for input(s): CHOL, HDL, LDLCALC, TRIG, CHOLHDL, LDLDIRECT in the last 72 hours.  No results found for:  HGBA1C ------------------------------------------------------------------------------------------------------------------ No results for input(s): TSH, T4TOTAL, T3FREE, THYROIDAB in the last 72 hours.  Invalid input(s): FREET3  Cardiac Enzymes No results for input(s): CKMB, TROPONINI, MYOGLOBIN in the last 168 hours.  Invalid input(s): CK ------------------------------------------------------------------------------------------------------------------    Component Value Date/Time  BNP 152.5 (H) 03/01/2019 0206    Micro Results Recent Results (from the past 240 hour(s))  SARS Coronavirus 2 Ag (30 min TAT) - Nasal Swab (BD Veritor Kit)     Status: Abnormal   Collection Time: 02/25/19  3:46 PM   Specimen: Nasal Swab (BD Veritor Kit)  Result Value Ref Range Status   SARS Coronavirus 2 Ag POSITIVE (A) NEGATIVE Final    Comment: RESULT CALLED TO, READ BACK BY AND VERIFIED WITH:  MARVA SIMMS '@1627'$  ON 02/25/2019, [email or web address] (NOTE) SARS-CoV-2 antigen PRESENT. Positive results indicate the presence of viral antigens, but clinical correlation with patient history and other diagnostic information is necessary to determine patient infection status.  Positive results do not rule out bacterial infection or co-infection  with other viruses. False positive results are rare but can occur, and confirmatory RT-PCR testing may be appropriate in some circumstances. The expected result is Negative. Fact Sheet for Patients: PodPark.tn Fact Sheet for Providers: GiftContent.is  This test is not yet approved or cleared by the [place] FDA and  has been authorized for detection and/or diagnosis of SARS-CoV-2 by FDA under an Emergency Use Authorization (EUA).  This EUA will remain in effect (meaning this test can be used) for the duration of  the COVID- 19 declaration under Section 564(b)(1) of the Act, 21 U.S.C. section 360bbb-3(b)(1),  unless the authorization is terminated or revoked sooner. Performed at [facility], [address] [place], [place] [address]   Blood Culture (routine x 2)     Status: None (Preliminary result)   Collection Time: 02/25/19  8:35 PM   Specimen: BLOOD  Result Value Ref Range Status   Specimen Description   Final    BLOOD RIGHT ANTECUBITAL Performed at [facility], [address] [place], [place] [address]    Special Requests   Final    BOTTLES DRAWN AEROBIC AND ANAEROBIC Blood Culture adequate volume Performed at [facility], [address] [place], [place] [address]    Culture   Final    NO GROWTH 4 DAYS Performed at [facility], [place] [address] [place], [place] [address]    Report Status PENDING  Incomplete  Blood Culture (routine x 2)     Status: None (Preliminary result)   Collection Time: 02/25/19  9:00 PM   Specimen: BLOOD  Result Value Ref Range Status   Specimen Description   Final    BLOOD LEFT ANTECUBITAL Performed at [facility], [address] [place], [place] [address]    Special Requests   Final    BOTTLES DRAWN AEROBIC AND ANAEROBIC Blood Culture adequate volume Performed at [facility], [address] [place], [place] [address]    Culture   Final    NO GROWTH 4 DAYS Performed at [facility], [place] [address] [place], [place] [address]    Report Status PENDING  Incomplete    Radiology Reports CT Angio Chest PE W and/or Wo Contrast  Result Date: 02/25/2019 CLINICAL DATA:  Shortness of breath. EXAM: CT ANGIOGRAPHY CHEST WITH CONTRAST TECHNIQUE: Multidetector CT imaging of the chest was performed using the standard protocol during bolus administration of intravenous contrast. Multiplanar CT image reconstructions and MIPs were obtained to evaluate the vascular anatomy. CONTRAST:  176m OMNIPAQUE IOHEXOL 350 MG/ML SOLN COMPARISON:  None. FINDINGS: Cardiovascular: Contrast injection is  sufficient to demonstrate satisfactory opacification of the pulmonary arteries to the  segmental level. There is no pulmonary embolus. The main pulmonary artery is dilated measuring approximately 3.4 cm in diameter. There is no CT evidence of acute right heart strain. There are atherosclerotic changes of the thoracic aorta. Heart size is enlarged. There is significant left atrial enlargement. Mitral valve calcifications are noted. Mediastinum/Nodes: --his likely reactive mediastinal and hilar adenopathy. --No axillary lymphadenopathy. --No supraclavicular lymphadenopathy. --Normal thyroid gland. --The esophagus is unremarkable Lungs/Pleura: There are diffuse bilateral ground-glass airspace opacities. There is a small right-sided pleural effusion with adjacent atelectasis. There is a small amount of atelectasis at the left lung base. The trachea is unremarkable. There is no pneumothorax. Upper Abdomen: No acute abnormality. Musculoskeletal: No chest wall abnormality. No acute or significant osseous findings. Review of the MIP images confirms the above findings. IMPRESSION: 1. No evidence for pulmonary embolus. 2. Diffuse bilateral ground-glass airspace opacities. This can be seen in patients with an atypical infectious process such as viral pneumonia or developing pulmonary edema. 3. Cardiomegaly with significant left atrial enlargement. 4. Small right-sided pleural effusion with adjacent atelectasis. 5. Enlarged main pulmonary artery, can be seen with pulmonary arterial hypertension. Aortic Atherosclerosis (ICD10-I70.0). Electronically Signed   By: Constance Holster M.D.   On: 02/25/2019 21:35   DG Chest Portable 1 View  Result Date: 02/25/2019 CLINICAL DATA:  Shortness of breath. Cough. Weakness. COVID-19. EXAM: PORTABLE CHEST 1 VIEW COMPARISON:  08/12/2018 and 08/11/2018 FINDINGS: The heart size and pulmonary vascularity are normal. Small focal area of infiltrate in the left upper lobe. Chronic slight  accentuation of the interstitial markings as demonstrated on the prior chest CT. No acute bone abnormality. IMPRESSION: Small focal infiltrate in the left upper lobe. Mild chronic interstitial lung disease. Electronically Signed   By: Lorriane Shire M.D.   On: 02/25/2019 18:32

## 2019-03-01 NOTE — Progress Notes (Signed)
Occupational Therapy Treatment Patient Details Name: Robin Harrell MRN: [id number] DOB: 01/27/1934 Today's Date: 03/01/2019    History of present illness Robin Harrell is a 86 y.o. 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 Patient has small right upper lobe infiltrate, and with Acute Pneumonia secondary to [place].   OT comments  Pt progressing towards established OT goals and is motivated to participate in therapy. Pt performing toileting at [facility] with Min Guard-Min A for balance and safety. Pt performing functional mobility to sink for hand hygiene with SPC and Min Guard A. Pt donning underwear with pad with Min A for bringing over her feet. Pt maintaining SpO2 in 90s on RA with activity. Discussed home support with daughter, [name], via phone and she reports that both her [name]) and her father (pt's husband) have been diagnosed with COVID. She is concerned about caring for both parents while she is also feeling sick/weak. Will continue to recommend dc to SNF for further rehab and will continue to follow acutely as admitted.    Follow Up Recommendations  SNF;Supervision/Assistance - 24 hour    Equipment Recommendations  3 in 1 bedside commode    Recommendations for Other Services      Precautions / Restrictions Precautions Precautions: Fall Restrictions Weight Bearing Restrictions: No       Mobility Bed Mobility               General bed mobility comments: In recliner upon arrival  Transfers Overall transfer level: Needs assistance Equipment used: Straight cane Transfers: Sit to/from [facility] Sit to Stand: Min guard;Min assist Stand pivot transfers: Min assist            Balance Overall balance assessment: Mild deficits observed, not formally tested Sitting-balance support: Single extremity supported Sitting balance-Leahy Scale: Good     Standing balance support: Single extremity supported Standing balance-Leahy Scale: Fair Standing balance comment: Utilized RW (FW) and was more steady than attempting with SPC (hers is in room)                           ADL either performed or assessed with clinical judgement   ADL Overall ADL's : Needs assistance/impaired Eating/Feeding: Set up;Sitting   Grooming: Wash/dry hands;Min guard;Standing Grooming Details (indicate cue type and reason): Pt performing hand hygiene at sink with Min Guard A for safety             Lower Body Dressing: Minimal assistance;Sit to/from stand Lower Body Dressing Details (indicate cue type and reason): Min A for assistance pull underwear over her feet. Pt then Min Guard A in standing for balance to bring underwear over hips. Toilet Transfer: BSC;Minimal assistance(single hand held A) Armed forces technical officer Details (indicate cue type and reason): Min A for single hand held A for balance. Pivot to [facility] due to urinary urgency Toileting- Clothing Manipulation and Hygiene: Min guard;Sit to/from stand Toileting - Clothing Manipulation Details (indicate cue type and reason): Min Guard A for safety in standing while pt performed peri care.     Functional mobility during ADLs: Minimal assistance;Min guard(SPC) General ADL Comments: Pt demonstrating increased actiivty tolerance and balance. Requiring Min Guard-Min A for toileting and mobility. Use of SPC. Pt reporting she fell (~6 months ago?) and she has been scared to fall again.      Vision  Perception     Praxis      Cognition Arousal/Alertness: Awake/alert Behavior During Therapy: WFL for tasks assessed/performed Overall Cognitive Status: Within Functional Limits for tasks assessed                                 General Comments: Feel pt is close to baseline cognition. Following cues and demonstrating good awareness of errors as they arise. Required some repeated cues  but feel this is appropiate for age.        Exercises     Shoulder Instructions       General Comments VSS on RA    Pertinent Vitals/ Pain       Pain Assessment: No/denies pain  Home Living                                          Prior Functioning/Environment              Frequency  Min 2X/week        Progress Toward Goals  OT Goals(current goals can now be found in the care plan section)  Progress towards OT goals: Progressing toward goals  Acute Rehab OT Goals Patient Stated Goal: Just wants to be able to return to home as independent as possible OT Goal Formulation: With patient Time For Goal Achievement: 03/13/19 Potential to Achieve Goals: Good ADL Goals Pt Will Perform Grooming: with supervision;standing Pt Will Perform Upper Body Dressing: with set-up;sitting Pt Will Perform Lower Body Dressing: with supervision;sit to/from stand Pt Will Transfer to Toilet: with supervision;ambulating Additional ADL Goal #1: Pt will recall 3 ways of conserving energy during ADL routine with one or less verbal cue  Plan Discharge plan remains appropriate    Co-evaluation                 AM-PAC OT "6 Clicks" Daily Activity     Outcome Measure   Help from another person eating meals?: A Little Help from another person taking care of personal grooming?: A Little Help from another person toileting, which includes using toliet, bedpan, or urinal?: A Little Help from another person bathing (including washing, rinsing, drying)?: A Lot Help from another person to put on and taking off regular upper body clothing?: A Little Help from another person to put on and taking off regular lower body clothing?: A Little 6 Click Score: 17    End of Session Equipment Utilized During Treatment: Gait belt;Other (comment)(SPC)  OT Visit Diagnosis: Unsteadiness on feet (R26.81);Other abnormalities of gait and mobility (R26.89);Muscle weakness (generalized)  (M62.81);Other symptoms and signs involving cognitive function   Activity Tolerance Patient tolerated treatment well   Patient Left in chair;with call bell/phone within reach;with chair alarm set   Nurse Communication Mobility status;Precautions        Time: [id number] OT Time Calculation (min): 26 min  Charges: OT General Charges $OT Visit: 1 Visit OT Treatments $Self Care/Home Management : 23-37 mins  [place], OTR/L Acute Rehab Pager: [phone] Office: [place] 03/01/2019, 1:16 PM

## 2019-03-01 NOTE — Progress Notes (Signed)
ANTICOAGULATION CONSULT NOTE - Initial Consult  Pharmacy Consult for Coumadin Indication: Factor V Leiden  Allergies  Allergen Reactions  . Azithromycin Nausea Only  . Codeine Nausea Only  . Erythromycin Nausea And Vomiting    Patient Measurements: Height: 5\' 2"  (157.5 cm) Weight: 127 lb 6.8 oz (57.8 kg) IBW/kg (Calculated) : 50.1  Vital Signs: Temp: 98.1 F (36.7 C) (01/08 0800) Temp Source: Oral (01/08 0800) BP: 122/70 (01/08 0800) Pulse Rate: 85 (01/08 0800)  Labs: Recent Labs    02/27/19 0400 02/28/19 0034 03/01/19 0206  HGB 11.0* 10.1* 10.3*  HCT 33.1* 30.8* 31.0*  PLT 211 225 230  LABPROT 43.6* 36.8* 26.2*  INR 4.6* 3.7* 2.4*  CREATININE 0.77 1.23* 0.98    Estimated Creatinine Clearance: 34.4 mL/min (by C-G formula based on SCr of 0.98 mg/dL).   Medical History: Past Medical History:  Diagnosis Date  . Allergic rhinitis   . Cough   . Difficulty in swallowing    w/ occasional aspiration  . DVT (deep venous thrombosis) ([place])   . Dyslipidemia   . Factor V Leiden deficiency    lifelong coumadin  . GERD (gastroesophageal reflux disease)   . History of nuclear stress test 12/28/2007   lexiscan; low risk   . HTN (hypertension)   . PND (post-nasal drip)   . Pulmonary embolism ([place])   . PVC's (premature ventricular contractions)   . Thyroid disease     Medications:  No current facility-administered medications on file prior to encounter.   Current Outpatient Medications on File Prior to Encounter  Medication Sig Dispense Refill  . acetaminophen (TYLENOL) 500 MG tablet Take 1,000 mg by mouth every 6 (six) hours as needed for moderate pain.    Marland Kitchen atorvastatin (LIPITOR) 10 MG tablet TAKE 1 TABLET (10 MG TOTAL) BY MOUTH DAILY AT 6 PM. (Patient taking differently: Take 10 mg by mouth at bedtime. ) 90 tablet 2  . brimonidine (ALPHAGAN) 0.15 % ophthalmic solution Place 2 drops into both eyes 2 (two) times daily.   3  . Calcium-Phosphorus-Vitamin D (CALCIUM/D3  ADULT GUMMIES) 200-96.6-200 MG-MG-UNIT CHEW Chew 2 tablets by mouth daily.    . cetirizine (ZYRTEC) 10 MG tablet TAKE 1 TABLET BY MOUTH EVERY DAY (Patient taking differently: Take 10 mg by mouth daily. ) 90 tablet 3  . chlorpheniramine (CHLOR-TRIMETON) 4 MG tablet Take 4 mg by mouth every morning.     . fluticasone (FLONASE) 50 MCG/ACT nasal spray Place 2 sprays into both nostrils daily. 16 g 2  . ipratropium (ATROVENT) 0.03 % nasal spray PLACE 2 SPRAYS INTO BOTH NOSTRILS 3 (THREE) TIMES DAILY AS NEEDED FOR RHINITIS. 30 mL 6  . latanoprost (XALATAN) 0.005 % ophthalmic solution Place 1 drop into both eyes at bedtime.     Marland Kitchen levothyroxine (SYNTHROID, LEVOTHROID) 25 MCG tablet TAKE 1 TABLET BY MOUTH EVERY DAY (Patient taking differently: Take 25 mcg by mouth daily before breakfast. ) 90 tablet 3  . losartan (COZAAR) 50 MG tablet Take 1 tablet (50 mg total) by mouth daily. 90 tablet 1  . metoprolol tartrate (LOPRESSOR) 25 MG tablet TAKE 1 TABLET (25 MG TOTAL) BY MOUTH 2 (TWO) TIMES DAILY. *NEEDS OFFICE VISIT FOR FURTHER REFILLS* 90 tablet 0  . multivitamin-iron-minerals-folic acid (CENTRUM) chewable tablet Chew 1 tablet by mouth daily.    . vitamin E (VITAMIN E) 1000 UNIT capsule Take 1,000 Units by mouth daily.    Marland Kitchen warfarin (COUMADIN) 7.5 MG tablet TAKE 1/2 TO 1 TABLET BY MOUTH DAILY  AS DIRECTED (Patient taking differently: Take 3.75-7.5 mg by mouth See admin instructions. Take 1/2 tablet Mon, Wed, Fri and 1 tablet all other days of the week.) 90 tablet 1  . azithromycin (ZITHROMAX) 250 MG tablet Take 2 pills today then one a day for 4 additional days. DX J40 (Patient not taking: Reported on 02/25/2019) 6 tablet 0     Assessment: 86 y.o. female On Coumadin 3.75mg  daily exc 7.5mg  on MWF PTA for factor V Leiden deficiency. INR now down to 2.4. Coumadin was restarted with low dose on 1/7. Hgb low but stable at 10.3, plts wnl.   Goal of Therapy:  INR 2-3 Monitor platelets by anticoagulation protocol:  Yes   Plan:  Give Coumadin 7.5mg  PO x 1 tonight Monitor daily INR, CBC, s/s of bleed  Elenor Quinones, PharmD, BCPS, BCIDP Clinical Pharmacist 03/01/2019 8:24 AM

## 2019-03-01 NOTE — Plan of Care (Signed)

## 2019-03-01 NOTE — Evaluation (Signed)
Clinical/Bedside Swallow Evaluation Patient Details  Name: Robin Harrell MRN: [id number] Date of Birth: 01-21-1934  Today's Date: 03/01/2019 Time: SLP Start Time (ACUTE ONLY): [id number] SLP Stop Time (ACUTE ONLY): 0845 SLP Time Calculation (min) (ACUTE ONLY): 12 min  Past Medical History:  Past Medical History:  Diagnosis Date  . Allergic rhinitis   . Cough   . Difficulty in swallowing    w/ occasional aspiration  . DVT (deep venous thrombosis) ([place])   . Dyslipidemia   . Factor V Leiden deficiency    lifelong coumadin  . GERD (gastroesophageal reflux disease)   . History of nuclear stress test 12/28/2007   lexiscan; low risk   . HTN (hypertension)   . PND (post-nasal drip)   . Pulmonary embolism ([place])   . PVC's (premature ventricular contractions)   . Thyroid disease    Past Surgical History:  Past Surgical History:  Procedure Laterality Date  . APPENDECTOMY    . BUNIONECTOMY    . CATARACT EXTRACTION W/ INTRAOCULAR LENS IMPLANT Right 11/02/2013  . CHOLECYSTECTOMY  1973  . ESOPHAGEAL DILATION    . HAMMER TOE SURGERY    . history of sleep study  12/27/2007   AHI during total sleep 0.9/hr and REM 1.5/hr; RDI during total sleep6.0/hr and REM 6.1/hr  . 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 TRANSTHORACIC ECHOCARDIOGRAM  12/28/2007   borderline conc LVH with normal systolic function; MV mod thickened with mild MVP & mild MR   HPI:  Robin Harrell is a 86 y.o. female with medical history significant of factor V Leyden deficiency, DVT, chronically on Coumadin, hypertension presents to [facility] for cough and fever.  She has been sick over thelast week, he was diagnosed with COVID-19 pneumonia. Weak and deconditioned.  Pt has a history of prior esophageal dilatation.   Assessment / Plan / Recommendation Clinical Impression  Pt demosntrates no signs of aspiration, uses aspiration precautions independently given very good cognition and awareness. She  does report occasional need to expectorate tough solids. We reviewed basic strategies, diet textures and f/u with GI if problem worsens (pt has had esophageal dilatation in the past). For now will, change diet to mech soft as those trays seem to come with more gravy/sauce here. Otherwise no SLP f/u needed, will sign off.  SLP Visit Diagnosis: Dysphagia, unspecified (R13.10)    Aspiration Risk  Mild aspiration risk    Diet Recommendation Dysphagia 3 (Mech soft);Thin liquid   Liquid Administration via: Cup;Straw Medication Administration: Whole meds with puree Supervision: Patient able to self feed Compensations: Slow rate;Small sips/bites;Follow solids with liquid Postural Changes: Seated upright at 90 degrees    Other  Recommendations Oral Care Recommendations: Oral care BID   Follow up Recommendations None      Frequency and Duration            Prognosis        Swallow Study   General HPI: Robin Harrell is a 86 y.o. female with medical history significant of factor V Leyden deficiency, DVT, chronically on Coumadin, hypertension presents to [place] for cough and fever.  She has been sick over thelast week, he was diagnosed with COVID-19 pneumonia. Weak and deconditioned.  Type of Study: Bedside Swallow Evaluation Previous Swallow Assessment: none Diet Prior to this Study: Dysphagia 3 (soft);Thin liquids Temperature Spikes Noted: No Respiratory Status: Room air History of Recent Intubation: No Behavior/Cognition: Alert;Cooperative;Pleasant mood Oral Cavity  Assessment: Within Functional Limits Oral Care Completed by SLP: No Oral Cavity - Dentition: Adequate natural dentition Vision: Functional for self-feeding Self-Feeding Abilities: Able to feed self Patient Positioning: Upright in bed Baseline Vocal Quality: Normal Volitional Cough: Strong Volitional Swallow: Able to elicit    Oral/Motor/Sensory Function     Ice Chips     Thin Liquid Thin  Liquid: Within functional limits Presentation: Straw    Nectar Thick Nectar Thick Liquid: Not tested   Honey Thick Honey Thick Liquid: Not tested   Puree Puree: Not tested   Solid     Solid: Impaired Presentation: Self Fed Oral Phase Impairments: (small bites, very cautions mastication)     Herbie Baltimore, MA CCC-SLP  Acute Rehabilitation Services Pager [phone] Office [phone]  Lynann Beaver 03/01/2019,10:01 AM

## 2019-03-02 LAB — URINALYSIS, ROUTINE W REFLEX MICROSCOPIC
Bilirubin Urine: NEGATIVE
Glucose, UA: NEGATIVE mg/dL
Hgb urine dipstick: NEGATIVE
Ketones, ur: NEGATIVE mg/dL
Leukocytes,Ua: NEGATIVE
Nitrite: NEGATIVE
Protein, ur: NEGATIVE mg/dL
Specific Gravity, Urine: 1.014 (ref 1.005–1.030)
pH: 5 (ref 5.0–8.0)

## 2019-03-02 LAB — COMPREHENSIVE METABOLIC PANEL
ALT: 25 U/L (ref 0–44)
AST: 27 U/L (ref 15–41)
Albumin: 2.7 g/dL — ABNORMAL LOW (ref 3.5–5.0)
Alkaline Phosphatase: 49 U/L (ref 38–126)
Anion gap: 10 (ref 5–15)
BUN: 33 mg/dL — ABNORMAL HIGH (ref 8–23)
CO2: 24 mmol/L (ref 22–32)
Calcium: 8.6 mg/dL — ABNORMAL LOW (ref 8.9–10.3)
Chloride: 102 mmol/L (ref 98–111)
Creatinine, Ser: 0.85 mg/dL (ref 0.44–1.00)
GFR calc Af Amer: >60 mL/min (ref >60)
GFR calc non Af Amer: >60 mL/min (ref >60)
Glucose, Bld: 85 mg/dL (ref 70–99)
Potassium: 4.5 mmol/L (ref 3.5–5.1)
Sodium: 136 mmol/L (ref 135–145)
Total Bilirubin: 0.8 mg/dL (ref 0.3–1.2)
Total Protein: 5.8 g/dL — ABNORMAL LOW (ref 6.5–8.1)

## 2019-03-02 LAB — CBC WITH DIFFERENTIAL/PLATELET
Abs Immature Granulocytes: 0.32 10*3/uL — ABNORMAL HIGH (ref 0.00–0.07)
Basophils Absolute: 0.1 10*3/uL (ref 0.0–0.1)
Basophils Relative: 1 %
Eosinophils Absolute: 0.1 10*3/uL (ref 0.0–0.5)
Eosinophils Relative: 1 %
HCT: 31.2 % — ABNORMAL LOW (ref 36.0–46.0)
Hemoglobin: 10.4 g/dL — ABNORMAL LOW (ref 12.0–15.0)
Immature Granulocytes: 3 %
Lymphocytes Relative: 21 %
Lymphs Abs: 2 10*3/uL (ref 0.7–4.0)
MCH: 31 pg (ref 26.0–34.0)
MCHC: 33.3 g/dL (ref 30.0–36.0)
MCV: 92.9 fL (ref 80.0–100.0)
Monocytes Absolute: 0.8 10*3/uL (ref 0.1–1.0)
Monocytes Relative: 8 %
Neutro Abs: 6.3 10*3/uL (ref 1.7–7.7)
Neutrophils Relative %: 66 %
Platelets: 241 10*3/uL (ref 150–400)
RBC: 3.36 MIL/uL — ABNORMAL LOW (ref 3.87–5.11)
RDW: 13.4 % (ref 11.5–15.5)
WBC: 9.4 10*3/uL (ref 4.0–10.5)
nRBC: 0 % (ref 0.0–0.2)

## 2019-03-02 LAB — C-REACTIVE PROTEIN: CRP: 5 mg/dL — ABNORMAL HIGH (ref <1.0)

## 2019-03-02 LAB — MAGNESIUM: Magnesium: 2 mg/dL (ref 1.7–2.4)

## 2019-03-02 LAB — CULTURE, BLOOD (ROUTINE X 2)
Culture: NO GROWTH
Culture: NO GROWTH
Special Requests: ADEQUATE
Special Requests: ADEQUATE

## 2019-03-02 LAB — PROTIME-INR
INR: 2 — ABNORMAL HIGH (ref 0.8–1.2)
Prothrombin Time: 22.6 seconds — ABNORMAL HIGH (ref 11.4–15.2)

## 2019-03-02 LAB — D-DIMER, QUANTITATIVE: D-Dimer, Quant: 0.45 ug/mL-FEU (ref 0.00–0.50)

## 2019-03-02 LAB — BRAIN NATRIURETIC PEPTIDE: B Natriuretic Peptide: 263.5 pg/mL — ABNORMAL HIGH (ref 0.0–100.0)

## 2019-03-02 MED ORDER — WARFARIN SODIUM 3 MG PO TABS
3.0000 mg | ORAL_TABLET | Freq: Once | ORAL | Status: AC
  Administered 2019-03-02: 3 mg via ORAL
  Filled 2019-03-02: qty 1

## 2019-03-02 NOTE — Progress Notes (Signed)
PROGRESS NOTE                                                                                                                                                                                                             Patient Demographics:    Robin Harrell, is a 86 y.o. female, DOB - Apr 04, 1933, [id number]  Outpatient Primary MD for the patient is [name], MD    LOS - 5  Admit date - 02/25/2019    Chief Complaint  Patient presents with  . Cough    + covid       Brief Narrative   Robin Harrell is a 86 y.o. female with medical history significant of factor V Leyden deficiency, DVT, chronically on Coumadin, hypertension presents to [facility] for cough and fever.  She has been sick over the last week, he was diagnosed with COVID-19 pneumonia and admitted to the hospital.   Subjective:   Patient in bed, appears comfortable, denies any headache, no fever, no chest pain or pressure, no shortness of breath , no abdominal pain. No focal weakness.   Assessment  & Plan :     1.  Acute Covid 19 Viral Pneumonitis during the ongoing 2020 Covid 19 Pandemic - she has mild to moderate disease, has been placed on IV steroids and remdesivir, her overall issue appears to be extreme weakness and deconditioning due to advanced age.  Will encourage to increase activity, sit up in chair, PT OT evaluation, patient has no help at home and may require SNF.  Currently on room air at rest and upon ambulation on 2 L, will continue to titrate down oxygen and titrate down steroids.  Encouraged the patient to sit up in chair in the daytime use I-S and flutter valve for pulmonary toiletry and then prone in bed when at night.   SpO2: 94 % O2 Flow Rate (L/min): 2 L/min  Recent Labs  Lab 02/25/19 2035 02/27/19 0400 02/28/19 0034 03/01/19 0206 03/02/19 0039  CRP 23.9* 17.5* 10.0* 6.1* 5.0*  DDIMER 1.05* 0.68*  0.43 0.37 0.45  FERRITIN 550*  --   --   --   --   BNP  --   --  152.6* 152.5* 263.5*  PROCALCITON <0.10  --   --   --   --  Hepatic Function Latest Ref Rng & Units 03/02/2019 03/01/2019 02/28/2019  Total Protein 6.5 - 8.1 g/dL 5.8(L) 5.6(L) 5.7(L)  Albumin 3.5 - 5.0 g/dL 2.7(L) 2.4(L) 2.5(L)  AST 15 - 41 U/L 27 31 32  ALT 0 - 44 U/L '25 26 27  '$ Alk Phosphatase 38 - 126 U/L 49 48 53  Total Bilirubin 0.3 - 1.2 mg/dL 0.8 0.4 0.5  Bilirubin, Direct 0.0 - 0.3 mg/dL - - -    2.  History of DVT factor V Leyden deficiency.  On Coumadin. Pharmacy monitoring INR.  3.  Chronic idiopathic thrombocytopenia.  Stable no acute issues.   4.  Essential hypertension.  On combination of beta-blocker, ARB.  Monitor and adjust.  5.  Dehydration, Hyponatremia, severe weakness and deconditioning.  PT, OT.  May require SNF.  IV fluids were repeated on 02/28/2019 with good response.  6.  Chronic diastolic CHF.  60% in 2018.  Compensated.   Condition - Fair  Family Communication  :  Daughter 02/27/19  Code Status :  Full  Diet :   Diet Order            DIET DYS 3 Room service appropriate? Yes; Fluid consistency: Thin  Diet effective now               Disposition Plan  : SNF when bed available, await bed  Consults  :  None  Procedures  :  None  PUD Prophylaxis : None  DVT Prophylaxis  :  Coumadin  Lab Results  Component Value Date   INR 2.0 (H) 03/02/2019   INR 2.4 (H) 03/01/2019   INR 3.7 (H) 02/28/2019     Lab Results  Component Value Date   PLT 241 03/02/2019    Inpatient Medications  Scheduled Meds: . vitamin C  500 mg Oral Daily  . atorvastatin  10 mg Oral q1800  . brimonidine  1 drop Both Eyes BID  . dexamethasone (DECADRON) injection  1 mg Intravenous Q24H  . feeding supplement (ENSURE ENLIVE)  237 mL Oral TID BM  . latanoprost  1 drop Both Eyes QHS  . levothyroxine  25 mcg Oral Daily  . loratadine  10 mg Oral Daily  . losartan  50 mg Oral Daily  . metoprolol  tartrate  25 mg Oral BID  . Warfarin - Pharmacist Dosing Inpatient   Does not apply q1800  . zinc sulfate  220 mg Oral Daily   Continuous Infusions: . remdesivir 100 mg in NS 100 mL Stopped (03/01/19 0940)   PRN Meds:.acetaminophen, guaiFENesin-dextromethorphan, lip balm, Melatonin, [DISCONTINUED] ondansetron **OR** ondansetron (ZOFRAN) IV, zolpidem  Antibiotics  :    Anti-infectives (From admission, onward)   Start     Dose/Rate Route Frequency Ordered Stop   02/27/19 1000  remdesivir 100 mg in sodium chloride 0.9 % 100 mL IVPB     100 mg 200 mL/hr over 30 Minutes Intravenous Daily 02/26/19 0004 03/03/19 0959   02/27/19 1000  remdesivir 100 mg in sodium chloride 0.9 % 100 mL IVPB  Status:  Discontinued     100 mg 200 mL/hr over 30 Minutes Intravenous Daily 02/26/19 2100 02/26/19 2111   02/26/19 2300  remdesivir 100 mg in sodium chloride 0.9 % 100 mL IVPB  Status:  Discontinued     100 mg 200 mL/hr over 30 Minutes Intravenous Daily 02/25/19 2244 02/26/19 0003   02/26/19 2115  remdesivir 200 mg in sodium chloride 0.9% 250 mL IVPB  Status:  Discontinued  200 mg 580 mL/hr over 30 Minutes Intravenous Once 02/26/19 2100 02/26/19 2111   02/26/19 0030  remdesivir 100 mg in sodium chloride 0.9 % 100 mL IVPB     100 mg 200 mL/hr over 30 Minutes Intravenous Every 1 hr x 2 02/26/19 0004 02/26/19 0226   02/25/19 2300  remdesivir 200 mg in sodium chloride 0.9% 250 mL IVPB  Status:  Discontinued     200 mg 580 mL/hr over 30 Minutes Intravenous Once 02/25/19 2244 02/26/19 0003       Time Spent in minutes  30   Lala Lund M.D on 03/02/2019 at 9:18 AM  To page go to [email or web address] - password [facility]  [facility] -  Office  [phone]  See all Orders from today for further details    Objective:   Vitals:   03/01/19 1948 03/01/19 2121 03/02/19 0500 03/02/19 0800  BP: 113/63  139/67 133/83  Pulse:  89  86  Resp: (!) 23   16  Temp: 98.6 F (37 C)  97.9 F (36.6 C) (!) 97.4  F (36.3 C)  TempSrc: Oral  Oral Oral  SpO2:    94%  Weight:      Height:        Wt Readings from Last 3 Encounters:  02/28/19 57.8 kg  02/25/19 58.5 kg  01/29/19 58.5 kg     Intake/Output Summary (Last 24 hours) at 03/02/2019 0918 Last data filed at 03/01/2019 2336 Gross per 24 hour  Intake --  Output 100 ml  Net -100 ml     Physical Exam  Awake Alert, No new F.N deficits, Normal affect [place].AT,PERRAL Supple Neck,No JVD, No cervical lymphadenopathy appriciated.  Symmetrical Chest wall movement, Good air movement bilaterally, CTAB RRR,No Gallops, Rubs or new Murmurs, No Parasternal Heave +ve B.Sounds, Abd Soft, No tenderness, No organomegaly appriciated, No rebound - guarding or rigidity. No Cyanosis, Clubbing or edema, No new Rash or bruise    Data Review:    CBC Recent Labs  Lab 02/25/19 1555 02/27/19 0400 02/28/19 0034 03/01/19 0206 03/02/19 0039  WBC 7.5 10.3 11.3* 8.1 9.4  HGB 11.8* 11.0* 10.1* 10.3* 10.4*  HCT 35.5* 33.1* 30.8* 31.0* 31.2*  PLT 170 211 225 230 241  MCV 91.3 91.7 91.9 92.8 92.9  MCH 30.3 30.5 30.1 30.8 31.0  MCHC 33.2 33.2 32.8 33.2 33.3  RDW 12.6 12.7 13.0 13.2 13.4  LYMPHSABS 0.6* 0.8 1.0 1.5 2.0  MONOABS 0.5 0.8 0.8 0.7 0.8  EOSABS 0.0 0.0 0.0 0.0 0.1  BASOSABS 0.0 0.0 0.0 0.0 0.1    Chemistries  Recent Labs  Lab 02/25/19 1555 02/27/19 0400 02/28/19 0034 03/01/19 0206 03/02/19 0039  NA 129* 136 133* 135 136  K 4.0 3.5 4.2 4.1 4.5  CL 95* 102 102 104 102  CO2 22 24 19* 23 24  GLUCOSE 152* 117* 123* 84 85  BUN 16 25* 51* 37* 33*  CREATININE 0.87 0.77 1.23* 0.98 0.85  CALCIUM 8.5* 8.2* 8.4* 8.3* 8.6*  MG  --   --  2.2 2.1 2.0  AST 33 37 32 31 27  ALT '17 27 27 26 25  '$ ALKPHOS 59 53 53 48 49  BILITOT 1.1 1.0 0.5 0.4 0.8   ------------------------------------------------------------------------------------------------------------------ No results for input(s): CHOL, HDL, LDLCALC, TRIG, CHOLHDL, LDLDIRECT in the last 72  hours.  No results found for: HGBA1C ------------------------------------------------------------------------------------------------------------------ No results for input(s): TSH, T4TOTAL, T3FREE, THYROIDAB in the last 72 hours.  Invalid input(s): FREET3  Cardiac Enzymes No  results for input(s): CKMB, TROPONINI, MYOGLOBIN in the last 168 hours.  Invalid input(s): CK ------------------------------------------------------------------------------------------------------------------    Component Value Date/Time   BNP 263.5 (H) 03/02/2019 0039    Micro Results Recent Results (from the past 240 hour(s))  SARS Coronavirus 2 Ag (30 min TAT) - Nasal Swab (BD Veritor Kit)     Status: Abnormal   Collection Time: 02/25/19  3:46 PM   Specimen: Nasal Swab (BD Veritor Kit)  Result Value Ref Range Status   SARS Coronavirus 2 Ag POSITIVE (A) NEGATIVE Final    Comment: RESULT CALLED TO, READ BACK BY AND VERIFIED WITH:  MARVA SIMMS '@[id number]'$  ON 02/25/2019, [email or web address] (NOTE) SARS-CoV-2 antigen PRESENT. Positive results indicate the presence of viral antigens, but clinical correlation with patient history and other diagnostic information is necessary to determine patient infection status.  Positive results do not rule out bacterial infection or co-infection  with other viruses. False positive results are rare but can occur, and confirmatory RT-PCR testing may be appropriate in some circumstances. The expected result is Negative. Fact Sheet for Patients: PodPark.tn Fact Sheet for Providers: GiftContent.is  This test is not yet approved or cleared by the [place] FDA and  has been authorized for detection and/or diagnosis of SARS-CoV-2 by FDA under an Emergency Use Authorization (EUA).  This EUA will remain in effect (meaning this test can be used) for the duration of  the COVID- 19 declaration under Section 564(b)(1) of the Act,  21 U.S.C. section 360bbb-3(b)(1), unless the authorization is terminated or revoked sooner. Performed at [facility], [address] [place], [place] [address]   Blood Culture (routine x 2)     Status: None   Collection Time: 02/25/19  8:35 PM   Specimen: BLOOD  Result Value Ref Range Status   Specimen Description   Final    BLOOD RIGHT ANTECUBITAL Performed at [facility], [address] [place], [place] [address]    Special Requests   Final    BOTTLES DRAWN AEROBIC AND ANAEROBIC Blood Culture adequate volume Performed at [facility], [address] [place], [place] [address]    Culture   Final    NO GROWTH 5 DAYS Performed at [facility], [place] [address] [place], [place] [address]    Report Status 03/02/2019 FINAL  Final  Blood Culture (routine x 2)     Status: None   Collection Time: 02/25/19  9:00 PM   Specimen: BLOOD  Result Value Ref Range Status   Specimen Description   Final    BLOOD LEFT ANTECUBITAL Performed at [facility], [address] [place], [place] [address]    Special Requests   Final    BOTTLES DRAWN AEROBIC AND ANAEROBIC Blood Culture adequate volume Performed at [facility], [address] [place], [place] [address]    Culture   Final    NO GROWTH 5 DAYS Performed at [facility], [place] [address] [place], [place] [address]    Report Status 03/02/2019 FINAL  Final    Radiology Reports CT Angio Chest PE W and/or Wo Contrast  Result Date: 02/25/2019 CLINICAL DATA:  Shortness of breath. EXAM: CT ANGIOGRAPHY CHEST WITH CONTRAST TECHNIQUE: Multidetector CT imaging of the chest was performed using the standard protocol during bolus administration of intravenous contrast. Multiplanar CT image reconstructions and MIPs were obtained to evaluate the vascular anatomy. CONTRAST:  137m OMNIPAQUE IOHEXOL 350  MG/ML SOLN COMPARISON:  None. FINDINGS: Cardiovascular: Contrast injection is  sufficient to demonstrate satisfactory opacification of the pulmonary arteries to the segmental level. There is no pulmonary embolus. The main pulmonary artery is dilated measuring approximately 3.4 cm in diameter. There is no CT evidence of acute right heart strain. There are atherosclerotic changes of the thoracic aorta. Heart size is enlarged. There is significant left atrial enlargement. Mitral valve calcifications are noted. Mediastinum/Nodes: --his likely reactive mediastinal and hilar adenopathy. --No axillary lymphadenopathy. --No supraclavicular lymphadenopathy. --Normal thyroid gland. --The esophagus is unremarkable Lungs/Pleura: There are diffuse bilateral ground-glass airspace opacities. There is a small right-sided pleural effusion with adjacent atelectasis. There is a small amount of atelectasis at the left lung base. The trachea is unremarkable. There is no pneumothorax. Upper Abdomen: No acute abnormality. Musculoskeletal: No chest wall abnormality. No acute or significant osseous findings. Review of the MIP images confirms the above findings. IMPRESSION: 1. No evidence for pulmonary embolus. 2. Diffuse bilateral ground-glass airspace opacities. This can be seen in patients with an atypical infectious process such as viral pneumonia or developing pulmonary edema. 3. Cardiomegaly with significant left atrial enlargement. 4. Small right-sided pleural effusion with adjacent atelectasis. 5. Enlarged main pulmonary artery, can be seen with pulmonary arterial hypertension. Aortic Atherosclerosis (ICD10-I70.0). Electronically Signed   By: Constance Holster M.D.   On: 02/25/2019 21:35   DG Chest Portable 1 View  Result Date: 02/25/2019 CLINICAL DATA:  Shortness of breath. Cough. Weakness. COVID-19. EXAM: PORTABLE CHEST 1 VIEW COMPARISON:  08/12/2018 and 08/11/2018 FINDINGS: The heart size and pulmonary vascularity are normal. Small focal area of infiltrate in the left upper lobe. Chronic slight  accentuation of the interstitial markings as demonstrated on the prior chest CT. No acute bone abnormality. IMPRESSION: Small focal infiltrate in the left upper lobe. Mild chronic interstitial lung disease. Electronically Signed   By: Lorriane Shire M.D.   On: 02/25/2019 18:32

## 2019-03-02 NOTE — Plan of Care (Signed)

## 2019-03-02 NOTE — Progress Notes (Signed)
ANTICOAGULATION CONSULT NOTE - Follow Up Consult  Pharmacy Consult for Coumadin Indication: Factor V Leiden  Allergies  Allergen Reactions  . Azithromycin Nausea Only  . Codeine Nausea Only  . Erythromycin Nausea And Vomiting    Patient Measurements: Height: 5\' 2"  (157.5 cm) Weight: 127 lb 6.8 oz (57.8 kg) IBW/kg (Calculated) : 50.1  Vital Signs: Temp: 97.4 F (36.3 C) (01/09 0800) Temp Source: Oral (01/09 0800) BP: 133/83 (01/09 0800) Pulse Rate: 86 (01/09 0800)  Labs: Recent Labs    02/28/19 0034 03/01/19 0206 03/02/19 0039  HGB 10.1* 10.3* 10.4*  HCT 30.8* 31.0* 31.2*  PLT 225 230 241  LABPROT 36.8* 26.2* 22.6*  INR 3.7* 2.4* 2.0*  CREATININE 1.23* 0.98 0.85    Estimated Creatinine Clearance: 39.7 mL/min (by C-G formula based on SCr of 0.85 mg/dL).   Medical History: Past Medical History:  Diagnosis Date  . Allergic rhinitis   . Cough   . Difficulty in swallowing    w/ occasional aspiration  . DVT (deep venous thrombosis) ([place])   . Dyslipidemia   . Factor V Leiden deficiency    lifelong coumadin  . GERD (gastroesophageal reflux disease)   . History of nuclear stress test 12/28/2007   lexiscan; low risk   . HTN (hypertension)   . PND (post-nasal drip)   . Pulmonary embolism ([place])   . PVC's (premature ventricular contractions)   . Thyroid disease     Medications:  No current facility-administered medications on file prior to encounter.   Current Outpatient Medications on File Prior to Encounter  Medication Sig Dispense Refill  . acetaminophen (TYLENOL) 500 MG tablet Take 1,000 mg by mouth every 6 (six) hours as needed for moderate pain.    Marland Kitchen atorvastatin (LIPITOR) 10 MG tablet TAKE 1 TABLET (10 MG TOTAL) BY MOUTH DAILY AT 6 PM. (Patient taking differently: Take 10 mg by mouth at bedtime. ) 90 tablet 2  . brimonidine (ALPHAGAN) 0.15 % ophthalmic solution Place 2 drops into both eyes 2 (two) times daily.   3  . Calcium-Phosphorus-Vitamin D  (CALCIUM/D3 ADULT GUMMIES) 200-96.6-200 MG-MG-UNIT CHEW Chew 2 tablets by mouth daily.    . cetirizine (ZYRTEC) 10 MG tablet TAKE 1 TABLET BY MOUTH EVERY DAY (Patient taking differently: Take 10 mg by mouth daily. ) 90 tablet 3  . chlorpheniramine (CHLOR-TRIMETON) 4 MG tablet Take 4 mg by mouth every morning.     . fluticasone (FLONASE) 50 MCG/ACT nasal spray Place 2 sprays into both nostrils daily. 16 g 2  . ipratropium (ATROVENT) 0.03 % nasal spray PLACE 2 SPRAYS INTO BOTH NOSTRILS 3 (THREE) TIMES DAILY AS NEEDED FOR RHINITIS. 30 mL 6  . latanoprost (XALATAN) 0.005 % ophthalmic solution Place 1 drop into both eyes at bedtime.     Marland Kitchen levothyroxine (SYNTHROID, LEVOTHROID) 25 MCG tablet TAKE 1 TABLET BY MOUTH EVERY DAY (Patient taking differently: Take 25 mcg by mouth daily before breakfast. ) 90 tablet 3  . losartan (COZAAR) 50 MG tablet Take 1 tablet (50 mg total) by mouth daily. 90 tablet 1  . metoprolol tartrate (LOPRESSOR) 25 MG tablet TAKE 1 TABLET (25 MG TOTAL) BY MOUTH 2 (TWO) TIMES DAILY. *NEEDS OFFICE VISIT FOR FURTHER REFILLS* 90 tablet 0  . multivitamin-iron-minerals-folic acid (CENTRUM) chewable tablet Chew 1 tablet by mouth daily.    . vitamin E (VITAMIN E) 1000 UNIT capsule Take 1,000 Units by mouth daily.    Marland Kitchen warfarin (COUMADIN) 7.5 MG tablet TAKE 1/2 TO 1 TABLET BY MOUTH  DAILY AS DIRECTED (Patient taking differently: Take 3.75-7.5 mg by mouth See admin instructions. Take 1/2 tablet Mon, Wed, Fri and 1 tablet all other days of the week.) 90 tablet 1  . azithromycin (ZITHROMAX) 250 MG tablet Take 2 pills today then one a day for 4 additional days. DX J40 (Patient not taking: Reported on 02/25/2019) 6 tablet 0     Assessment: 86 y.o. female On Coumadin 3.75mg  daily exc 7.5mg  on MWF PTA for factor V Leiden deficiency. INR now down to 2 today. Coumadin was restarted with low dose on 1/7. H/H low stable. Plt wnl.   Poor oral intake. No significant drug interactions noted   Goal of  Therapy:  INR 2-3 Monitor platelets by anticoagulation protocol: Yes   Plan:  Give Coumadin 3mg  PO x 1 tonight as INR likely to trend back up soon Monitor daily INR, CBC, s/s of bleed  Albertina Parr, PharmD., BCPS Clinical Pharmacist Clinical phone for 03/01/18 until 5pm: [phone]

## 2019-03-03 LAB — C-REACTIVE PROTEIN: CRP: 3.6 mg/dL — ABNORMAL HIGH (ref <1.0)

## 2019-03-03 LAB — PROTIME-INR
INR: 2 — ABNORMAL HIGH (ref 0.8–1.2)
Prothrombin Time: 22.7 seconds — ABNORMAL HIGH (ref 11.4–15.2)

## 2019-03-03 MED ORDER — WARFARIN SODIUM 4 MG PO TABS
4.0000 mg | ORAL_TABLET | Freq: Once | ORAL | Status: AC
  Administered 2019-03-03: 4 mg via ORAL
  Filled 2019-03-03: qty 1

## 2019-03-03 NOTE — Plan of Care (Signed)

## 2019-03-03 NOTE — Progress Notes (Signed)
ANTICOAGULATION CONSULT NOTE - Follow Up Consult  Pharmacy Consult for Coumadin Indication: Factor V Leiden  Allergies  Allergen Reactions  . Azithromycin Nausea Only  . Codeine Nausea Only  . Erythromycin Nausea And Vomiting    Patient Measurements: Height: 5\' 2"  (157.5 cm) Weight: 127 lb 6.8 oz (57.8 kg) IBW/kg (Calculated) : 50.1  Vital Signs: Temp: 97.8 F (36.6 C) (01/10 0750) Temp Source: Oral (01/10 0750) BP: 115/79 (01/10 0750) Pulse Rate: 74 (01/10 0750)  Labs: Recent Labs    03/01/19 0206 03/02/19 0039 03/03/19 0500  HGB 10.3* 10.4*  --   HCT 31.0* 31.2*  --   PLT 230 241  --   LABPROT 26.2* 22.6* 22.7*  INR 2.4* 2.0* 2.0*  CREATININE 0.98 0.85  --     Estimated Creatinine Clearance: 39.7 mL/min (by C-G formula based on SCr of 0.85 mg/dL).   Assessment: 86 y.o. female On Coumadin 3.75mg  daily exc 7.5mg  on MWF PTA for factor V Leiden deficiency. INR stable at 2 today. Coumadin was restarted with low dose on 1/7. H/H low stable. Plt wnl.   Poor oral intake however consuming Ensure which has vitamin K. No significant drug interactions noted.   Goal of Therapy:  INR 2-3 Monitor platelets by anticoagulation protocol: Yes   Plan:  Give Coumadin 4 mg PO x 1 tonight  Monitor daily INR, CBC, s/s of bleed  Thank you for involving pharmacy in this patient's care.  Renold Genta, PharmD, BCPS Clinical Pharmacist Clinical phone for 03/03/2019 until 3:30p is [phone] 03/03/2019 11:16 AM  **Pharmacist phone directory can be found on [email or web address] listed under [place]**

## 2019-03-03 NOTE — Progress Notes (Signed)
PROGRESS NOTE                                                                                                                                                                                                             Patient Demographics:    Robin Harrell, is a 86 y.o. female, DOB - Jul 06, 1933, [id number]  Outpatient Primary MD for the patient is [name], MD    LOS - 6  Admit date - 02/25/2019    Chief Complaint  Patient presents with  . Cough    + covid       Brief Narrative   Robin Harrell is a 86 y.o. 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  She has been sick over the last week, he was diagnosed with COVID-19 pneumonia and admitted to the hospital.   Subjective:   Patient in chair, appears comfortable, denies any headache, no fever, no chest pain or pressure, no shortness of breath , no abdominal pain. No focal weakness.    Assessment  & Plan :     1.  Acute Covid 19 Viral Pneumonitis during the ongoing 2020 Covid 19 Pandemic - she has mild to moderate disease, has been placed on IV steroids and remdesivir, her overall issue appears to be extreme weakness and deconditioning due to advanced age.  Will encourage to increase activity, sit up in chair, PT OT evaluation, patient has no help at home and may require SNF.  Currently on room air at rest and upon ambulation on 2 L, will continue to titrate down oxygen and titrate down steroids.  Encouraged the patient to sit up in chair in the daytime use I-S and flutter valve for pulmonary toiletry and then prone in bed when at night.   SpO2: 94 % O2 Flow Rate (L/min): 2 L/min  Recent Labs  Lab 02/25/19 2035 02/27/19 0400 02/28/19 0034 03/01/19 0206 03/02/19 0039 03/03/19 0500  CRP 23.9* 17.5* 10.0* 6.1* 5.0*  3.6*  DDIMER 1.05* 0.68* 0.43 0.37 0.45  --   FERRITIN 550*  --   --   --   --   --   BNP  --   --  152.6* 152.5* 263.5*  --  PROCALCITON <0.10  --   --   --   --   --     Hepatic Function Latest Ref Rng & Units 03/02/2019 03/01/2019 02/28/2019  Total Protein 6.5 - 8.1 g/dL 5.8(L) 5.6(L) 5.7(L)  Albumin 3.5 - 5.0 g/dL 2.7(L) 2.4(L) 2.5(L)  AST 15 - 41 U/L 27 31 32  ALT 0 - 44 U/L '25 26 27  '$ Alk Phosphatase 38 - 126 U/L 49 48 53  Total Bilirubin 0.3 - 1.2 mg/dL 0.8 0.4 0.5  Bilirubin, Direct 0.0 - 0.3 mg/dL - - -    2.  History of DVT factor V Leyden deficiency.  On Coumadin. Pharmacy monitoring INR.  Lab Results  Component Value Date   INR 2.0 (H) 03/03/2019   INR 2.0 (H) 03/02/2019   INR 2.4 (H) 03/01/2019     3.  Chronic idiopathic thrombocytopenia.  Stable no acute issues.   4.  Essential hypertension.  On combination of beta-blocker, ARB.  Monitor and adjust.  5.  Dehydration, Hyponatremia, severe weakness and deconditioning.  PT, OT.  May require SNF.  IV fluids were repeated on 02/28/2019 with good response.  6.  Chronic diastolic CHF.  60% in 2018.  Compensated.   Condition - Fair  Family Communication  :  Daughter 02/27/19  Code Status :  Full  Diet :   Diet Order            DIET DYS 3 Room service appropriate? Yes; Fluid consistency: Thin  Diet effective now               Disposition Plan  : SNF when bed available, await bed  Consults  :  None  Procedures  :  None  PUD Prophylaxis : None  DVT Prophylaxis  :  Coumadin  Lab Results  Component Value Date   INR 2.0 (H) 03/03/2019   INR 2.0 (H) 03/02/2019   INR 2.4 (H) 03/01/2019     Lab Results  Component Value Date   PLT 241 03/02/2019    Inpatient Medications  Scheduled Meds: . vitamin C  500 mg Oral Daily  . atorvastatin  10 mg Oral q1800  . brimonidine  1 drop Both Eyes BID  . feeding supplement (ENSURE ENLIVE)  237 mL Oral TID BM  . latanoprost  1 drop Both Eyes QHS  . levothyroxine   25 mcg Oral Daily  . loratadine  10 mg Oral Daily  . losartan  50 mg Oral Daily  . metoprolol tartrate  25 mg Oral BID  . Warfarin - Pharmacist Dosing Inpatient   Does not apply q1800  . zinc sulfate  220 mg Oral Daily   Continuous Infusions:  PRN Meds:.acetaminophen, guaiFENesin-dextromethorphan, lip balm, Melatonin, [DISCONTINUED] ondansetron **OR** ondansetron (ZOFRAN) IV, zolpidem  Antibiotics  :    Anti-infectives (From admission, onward)   Start     Dose/Rate Route Frequency Ordered Stop   02/27/19 1000  remdesivir 100 mg in sodium chloride 0.9 % 100 mL IVPB     100 mg 200 mL/hr over 30 Minutes Intravenous Daily 02/26/19 0004 03/02/19 1007   02/27/19 1000  remdesivir 100 mg in sodium chloride 0.9 % 100 mL IVPB  Status:  Discontinued     100 mg 200 mL/hr over 30 Minutes Intravenous Daily 02/26/19 2100 02/26/19 2111   02/26/19 2300  remdesivir 100 mg in sodium chloride 0.9 % 100 mL IVPB  Status:  Discontinued     100 mg 200 mL/hr over 30 Minutes Intravenous  Daily 02/25/19 2244 02/26/19 0003   02/26/19 2115  remdesivir 200 mg in sodium chloride 0.9% 250 mL IVPB  Status:  Discontinued     200 mg 580 mL/hr over 30 Minutes Intravenous Once 02/26/19 2100 02/26/19 2111   02/26/19 0030  remdesivir 100 mg in sodium chloride 0.9 % 100 mL IVPB     100 mg 200 mL/hr over 30 Minutes Intravenous Every 1 hr x 2 02/26/19 0004 02/26/19 0226   02/25/19 2300  remdesivir 200 mg in sodium chloride 0.9% 250 mL IVPB  Status:  Discontinued     200 mg 580 mL/hr over 30 Minutes Intravenous Once 02/25/19 2244 02/26/19 0003       Time Spent in minutes  30   Lala Lund M.D on 03/03/2019 at 8:59 AM  To page go to [email or web address] - password [facility]  [facility] -  Office  [phone]  See all Orders from today for further details    Objective:   Vitals:   03/02/19 2333 03/03/19 0000 03/03/19 0323 03/03/19 0750  BP: 100/63 (!) 91/47 117/84 115/79  Pulse: 72 69 86 74  Resp: '18 18 20 16    '$ Temp: (!) 97.4 F (36.3 C)  97.8 F (36.6 C) 97.8 F (36.6 C)  TempSrc: Oral  Oral Oral  SpO2: 94% 94% 98% 94%  Weight:      Height:        Wt Readings from Last 3 Encounters:  02/28/19 57.8 kg  02/25/19 58.5 kg  01/29/19 58.5 kg     Intake/Output Summary (Last 24 hours) at 03/03/2019 0859 Last data filed at 03/02/2019 1334 Gross per 24 hour  Intake --  Output 2 ml  Net -2 ml     Physical Exam  Awake Alert,  No new F.N deficits,  .AT,PERRAL Supple Neck,No JVD, No cervical lymphadenopathy appriciated.  Symmetrical Chest wall movement, Good air movement bilaterally, CTAB RRR,No Gallops, Rubs or new Murmurs, No Parasternal Heave +ve B.Sounds, Abd Soft, No tenderness, No organomegaly appriciated, No rebound - guarding or rigidity. No Cyanosis, Clubbing or edema, No new Rash or bruise     Data Review:    CBC Recent Labs  Lab 02/25/19 1555 02/27/19 0400 02/28/19 0034 03/01/19 0206 03/02/19 0039  WBC 7.5 10.3 11.3* 8.1 9.4  HGB 11.8* 11.0* 10.1* 10.3* 10.4*  HCT 35.5* 33.1* 30.8* 31.0* 31.2*  PLT 170 211 225 230 241  MCV 91.3 91.7 91.9 92.8 92.9  MCH 30.3 30.5 30.1 30.8 31.0  MCHC 33.2 33.2 32.8 33.2 33.3  RDW 12.6 12.7 13.0 13.2 13.4  LYMPHSABS 0.6* 0.8 1.0 1.5 2.0  MONOABS 0.5 0.8 0.8 0.7 0.8  EOSABS 0.0 0.0 0.0 0.0 0.1  BASOSABS 0.0 0.0 0.0 0.0 0.1    Chemistries  Recent Labs  Lab 02/25/19 1555 02/27/19 0400 02/28/19 0034 03/01/19 0206 03/02/19 0039  NA 129* 136 133* 135 136  K 4.0 3.5 4.2 4.1 4.5  CL 95* 102 102 104 102  CO2 22 24 19* 23 24  GLUCOSE 152* 117* 123* 84 85  BUN 16 25* 51* 37* 33*  CREATININE 0.87 0.77 1.23* 0.98 0.85  CALCIUM 8.5* 8.2* 8.4* 8.3* 8.6*  MG  --   --  2.2 2.1 2.0  AST 33 37 32 31 27  ALT '17 27 27 26 25  '$ ALKPHOS 59 53 53 48 49  BILITOT 1.1 1.0 0.5 0.4 0.8   ------------------------------------------------------------------------------------------------------------------ No results for input(s): CHOL, HDL,  LDLCALC, TRIG, CHOLHDL, LDLDIRECT in the last  72 hours.  No results found for: HGBA1C ------------------------------------------------------------------------------------------------------------------ No results for input(s): TSH, T4TOTAL, T3FREE, THYROIDAB in the last 72 hours.  Invalid input(s): FREET3  Cardiac Enzymes No results for input(s): CKMB, TROPONINI, MYOGLOBIN in the last 168 hours.  Invalid input(s): CK ------------------------------------------------------------------------------------------------------------------    Component Value Date/Time   BNP 263.5 (H) 03/02/2019 0039    Micro Results Recent Results (from the past 240 hour(s))  SARS Coronavirus 2 Ag (30 min TAT) - Nasal Swab (BD Veritor Kit)     Status: Abnormal   Collection Time: 02/25/19  3:46 PM   Specimen: Nasal Swab (BD Veritor Kit)  Result Value Ref Range Status   SARS Coronavirus 2 Ag POSITIVE (A) NEGATIVE Final    Comment: RESULT CALLED TO, READ BACK BY AND VERIFIED WITH:  MARVA SIMMS '@1627'$  ON 02/25/2019, [email or web address] (NOTE) SARS-CoV-2 antigen PRESENT. Positive results indicate the presence of viral antigens, but clinical correlation with patient history and other diagnostic information is necessary to determine patient infection status.  Positive results do not rule out bacterial infection or co-infection  with other viruses. False positive results are rare but can occur, and confirmatory RT-PCR testing may be appropriate in some circumstances. The expected result is Negative. Fact Sheet for Patients: PodPark.tn Fact Sheet for Providers: GiftContent.is  This test is not yet approved or cleared by the [place] FDA and  has been authorized for detection and/or diagnosis of SARS-CoV-2 by FDA under an Emergency Use Authorization (EUA).  This EUA will remain in effect (meaning this test can be used) for the duration of  the COVID- 19  declaration under Section 564(b)(1) of the Act, 21 U.S.C. section 360bbb-3(b)(1), unless the authorization is terminated or revoked sooner. Performed at [facility], [address] [place], [place] [address]   Blood Culture (routine x 2)     Status: None   Collection Time: 02/25/19  8:35 PM   Specimen: BLOOD  Result Value Ref Range Status   Specimen Description   Final    BLOOD RIGHT ANTECUBITAL Performed at [facility] [address] [place], [place] [address]    Special Requests   Final    BOTTLES DRAWN AEROBIC AND ANAEROBIC Blood Culture adequate volume Performed at [facility], [address] [place], [place] [address]    Culture   Final    NO GROWTH 5 DAYS Performed at [facility], [place] [address] [place], [place] [address]    Report Status 03/02/2019 FINAL  Final  Blood Culture (routine x 2)     Status: None   Collection Time: 02/25/19  9:00 PM   Specimen: BLOOD  Result Value Ref Range Status   Specimen Description   Final    BLOOD LEFT ANTECUBITAL Performed at [facility], [address] [place], [place] [address]    Special Requests   Final    BOTTLES DRAWN AEROBIC AND ANAEROBIC Blood Culture adequate volume Performed at [facility], [address] [place], [place] [address]    Culture   Final    NO GROWTH 5 DAYS Performed at [facility], [place] [address] [place], [place] [address]    Report Status 03/02/2019 FINAL  Final    Radiology Reports CT Angio Chest PE W and/or Wo Contrast  Result Date: 02/25/2019 CLINICAL DATA:  Shortness of breath. EXAM: CT ANGIOGRAPHY CHEST WITH CONTRAST TECHNIQUE: Multidetector CT imaging of the chest was  performed using the standard protocol during bolus administration of intravenous contrast. Multiplanar CT image reconstructions and MIPs were obtained to evaluate the vascular anatomy. CONTRAST:  168m OMNIPAQUE IOHEXOL 350 MG/ML SOLN COMPARISON:  None.  FINDINGS: Cardiovascular: Contrast injection is sufficient to demonstrate satisfactory opacification of the pulmonary arteries to the segmental level. There is no pulmonary embolus. The main pulmonary artery is dilated measuring approximately 3.4 cm in diameter. There is no CT evidence of acute right heart strain. There are atherosclerotic changes of the thoracic aorta. Heart size is enlarged. There is significant left atrial enlargement. Mitral valve calcifications are noted. Mediastinum/Nodes: --his likely reactive mediastinal and hilar adenopathy. --No axillary lymphadenopathy. --No supraclavicular lymphadenopathy. --Normal thyroid gland. --The esophagus is unremarkable Lungs/Pleura: There are diffuse bilateral ground-glass airspace opacities. There is a small right-sided pleural effusion with adjacent atelectasis. There is a small amount of atelectasis at the left lung base. The trachea is unremarkable. There is no pneumothorax. Upper Abdomen: No acute abnormality. Musculoskeletal: No chest wall abnormality. No acute or significant osseous findings. Review of the MIP images confirms the above findings. IMPRESSION: 1. No evidence for pulmonary embolus. 2. Diffuse bilateral ground-glass airspace opacities. This can be seen in patients with an atypical infectious process such as viral pneumonia or developing pulmonary edema. 3. Cardiomegaly with significant left atrial enlargement. 4. Small right-sided pleural effusion with adjacent atelectasis. 5. Enlarged main pulmonary artery, can be seen with pulmonary arterial hypertension. Aortic Atherosclerosis (ICD10-I70.0). Electronically Signed   By: CConstance HolsterM.D.   On: 02/25/2019 21:35   DG Chest Portable 1 View  Result Date: 02/25/2019 CLINICAL DATA:  Shortness of breath. Cough. Weakness. COVID-19. EXAM: PORTABLE CHEST 1 VIEW COMPARISON:  08/12/2018 and 08/11/2018 FINDINGS: The heart size and pulmonary vascularity are normal. Small focal area of infiltrate  in the left upper lobe. Chronic slight accentuation of the interstitial markings as demonstrated on the prior chest CT. No acute bone abnormality. IMPRESSION: Small focal infiltrate in the left upper lobe. Mild chronic interstitial lung disease. Electronically Signed   By: JLorriane ShireM.D.   On: 02/25/2019 18:32

## 2019-03-03 NOTE — TOC Initial Note (Signed)
Transition of Care [facility]) - Initial/Assessment Note    Patient Details  Name: Robin Harrell MRN: [id number] Date of Birth: 03/24/1933  [facility] CM/SW Contact:    Cloa Bushong, Francetta Found, LCSW Phone Number: 03/03/2019, 12:05 PM  Clinical Narrative:                  Clinical Social Worker spoke with patient spouse and daughter [name]) regarding plans for discharge.  Patient spouse and daughter are both in agreement for patient to go to SNF prior to return home.  Patient whole family with COVID and too debilitated to care for patient in home environment.  CSW was able to secure a bed at [facility] tomorrow (03/04/19) and patient can admit before 12.  CSW updated patient family and MD of plans for discharge.  CSW attempted to reach patient in the room, however did not answer.  Patient family plans to communicate plans with patient as well.  Patient family very appreciative and supportive of plan in place.  CSW remains available for support and to facilitate patient discharge needs once bed available.  Expected Discharge Plan: Skilled Nursing Facility Barriers to Discharge: No SNF bed   Patient Goals and CMS Choice Patient states their goals for this hospitalization and ongoing recovery are:: Patient spouse would like patient to be more independent prior to return home   Choice offered to / list presented to : Spouse  Expected Discharge Plan and Services Expected Discharge Plan: [place] Acute Care Choice: [place] Living arrangements for the past 2 months: Single Family Home Expected Discharge Date: 03/03/19                                    Prior Living Arrangements/Services Living arrangements for the past 2 months: Single Family Home Lives with:: Spouse Patient language and need for interpreter reviewed:: Yes Do you feel safe going back to the place where you live?: Yes      Need for Family Participation in  Patient Care: Yes (Comment) Care giver support system in place?: Yes (comment)   Criminal Activity/Legal Involvement Pertinent to Current Situation/Hospitalization: No - Comment as needed  Activities of Daily Living Home Assistive Devices/Equipment: C-collar, Walker (specify type) ADL Screening (condition at time of admission) Patient's cognitive ability adequate to safely complete daily activities?: Yes Is the patient deaf or have difficulty hearing?: No Does the patient have difficulty seeing, even when wearing glasses/contacts?: No Does the patient have difficulty concentrating, remembering, or making decisions?: No Patient able to express need for assistance with ADLs?: Yes Does the patient have difficulty dressing or bathing?: Yes Independently performs ADLs?: No Communication: Independent Dressing (OT): Needs assistance Feeding: Independent Toileting: Needs assistance In/Out Bed: Needs assistance Walks in Home: Independent Does the patient have difficulty walking or climbing stairs?: Yes Weakness of Legs: Both Weakness of Arms/Hands: None  Permission Sought/Granted Permission sought to share information with : Case Manager, Customer service manager    Share Information with NAME: Cleon Gustin / Loraine Leriche  Permission granted to share info w AGENCY: [place] granted to share info w Relationship: Spouse / Daughter  Permission granted to share info w Contact Information: [phone] / [phone]  Emotional Assessment Appearance:: Other (Comment Required Attitude/Demeanor/Rapport: Unable to Assess Affect (typically observed): Unable to Assess Orientation: : Oriented to Self, Oriented to Place, Oriented to  Time, Oriented to Situation Alcohol / Substance Use: Not Applicable Psych Involvement: No (comment)  Admission diagnosis:  Acute respiratory failure due to COVID-19 (HCC) [U07.1, J96.00] Acute respiratory disease due to COVID-19 virus  [U07.1, J06.9] COVID-19 [U07.1] Patient Active Problem List   Diagnosis Date Noted  . Acute respiratory disease due to COVID-19 virus 02/26/2019  . Pneumonia due to COVID-19 virus 02/26/2019  . Acute respiratory failure due to COVID-19 ([place]) 02/25/2019  . 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 Chronic venous stasis dermatitis of both lower extremities 12/31/2018  . Aortic stenosis, mild 02/07/2017  . History of recurrent UTIs 01/26/2017  . Chronic idiopathic thrombocytopenia ([place]) 04/14/2016  . Essential tremor 09/03/2015  . History of respiratory failure 11/11/2014  . Hypothyroidism (acquired) 09/02/2014  . Edema of right lower extremity 02/01/2013  . Hyperlipidemia 11/19/2012  . Colon polyp 10/15/2012  . Long term (current) use of anticoagulants 05/10/2012  . Insomnia 10/11/2011  . Osteoarthritis, multiple sites 06/30/2011  . Diastolic dysfunction [id number]  . Factor V deficiency ([place]) 02/09/2010  . Osteoporosis 02/09/2010  . DJD (degenerative joint disease), lumbar 10/14/2009  . Glaucoma 01/29/2008  . Essential hypertension 11/15/2007  . History of DVT (deep vein thrombosis) 11/15/2007  . Seasonal and perennial allergic rhinitis 11/15/2007  . GERD 11/15/2007  . COUGH, CHRONIC 11/15/2007   PCP:  [name], MD Pharmacy:   [place], [place] - [address] [place] [address] Phone: [phone] Fax: [phone]  [id number] - [place], [place] - [address] [place] [address] Phone: [phone] Fax: [phone]  [id number] - [place], [place] [id number] [place] [id number] Phone: [phone] Fax: [phone]     Social Determinants of Health (SDOH) Interventions    Readmission Risk Interventions No flowsheet data found.

## 2019-03-03 NOTE — NC FL2 (Signed)
[place] LEVEL OF CARE SCREENING TOOL     IDENTIFICATION  Patient Name: Robin Harrell Birthdate: 01-Jun-1933 Sex: female Admission Date (Current Location): 02/25/2019  [facility] and [place] Number:  Herbalist and Address:         Provider Number: [id number]  Attending Physician Name and Address:  Thurnell Lose, MD  Relative Name and Phone Number:       Current Level of Care: Hospital Recommended Level of Care: [place] Prior Approval Number:    Date Approved/Denied:   PASRR Number: [id number]  Discharge Plan: SNF    Current Diagnoses: Patient Active Problem List   Diagnosis Date Noted  . Acute respiratory disease due to COVID-19 virus 02/26/2019  . Pneumonia due to COVID-19 virus 02/26/2019  . Acute respiratory failure due to COVID-19 ([place]) 02/25/2019  . 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 Chronic venous stasis dermatitis of both lower extremities 12/31/2018  . Aortic stenosis, mild 02/07/2017  . History of recurrent UTIs 01/26/2017  . Chronic idiopathic thrombocytopenia ([place]) 04/14/2016  . Essential tremor 09/03/2015  . History of respiratory failure 11/11/2014  . Hypothyroidism (acquired) 09/02/2014  . Edema of right lower extremity 02/01/2013  . Hyperlipidemia 11/19/2012  . Colon polyp 10/15/2012  . Long term (current) use of anticoagulants 05/10/2012  . Insomnia 10/11/2011  . Osteoarthritis, multiple sites 06/30/2011  . Diastolic dysfunction [id number]  . Factor V deficiency ([place]) 02/09/2010  . Osteoporosis 02/09/2010  . DJD (degenerative joint disease), lumbar 10/14/2009  . Glaucoma 01/29/2008  . Essential hypertension 11/15/2007  . History of DVT (deep vein thrombosis) 11/15/2007  . Seasonal and perennial allergic rhinitis 11/15/2007  . GERD 11/15/2007  . COUGH, CHRONIC 11/15/2007    Orientation RESPIRATION BLADDER Height & Weight     Self, Time, Situation, Place  Normal External catheter  Weight: 127 lb 6.8 oz (57.8 kg) Height:  5\' 2"  (157.5 cm)  BEHAVIORAL SYMPTOMS/MOOD NEUROLOGICAL BOWEL NUTRITION STATUS      Continent Diet(Dysphagia 3 with Thin Liquids)  AMBULATORY STATUS COMMUNICATION OF NEEDS Skin   Extensive Assist Verbally Normal                       Personal Care Assistance Level of Assistance  Bathing, Feeding, Dressing Bathing Assistance: Limited assistance Feeding assistance: Limited assistance Dressing Assistance: Limited assistance     Functional Limitations Info  Sight, Hearing, Speech Sight Info: Adequate Hearing Info: Adequate Speech Info: Adequate    SPECIAL CARE FACTORS FREQUENCY  PT (By licensed PT), OT (By licensed OT)     PT Frequency: 3x week OT Frequency: 3x week            Contractures Contractures Info: Not present    Additional Factors Info  Code Status, Allergies, Isolation Precautions Code Status Info: Full Code Allergies Info: Azithromycin, Codeine, Erythromycin     Isolation Precautions Info: COVID Positive 02/25/2019     Current Medications (03/03/2019):  This is the current hospital active medication list Current Facility-Administered Medications  Medication Dose Route Frequency Provider Last Rate Last Admin  . acetaminophen (TYLENOL) tablet 650 mg  650 mg Oral Q6H PRN Phillips Grout, MD   650 mg at 02/27/19 2229  . ascorbic acid (VITAMIN C) tablet 500 mg  500 mg Oral Daily Derrill Kay A, MD   500 mg at 03/02/19 0925  . atorvastatin (LIPITOR) tablet 10 mg  10 mg Oral q1800 Davonna Belling, MD   10 mg at  03/02/19 1725  . brimonidine (ALPHAGAN) 0.2 % ophthalmic solution 1 drop  1 drop Both Eyes BID Rise Patience, MD   1 drop at 03/03/19 [id number]  . feeding supplement (ENSURE ENLIVE) (ENSURE ENLIVE) liquid 237 mL  237 mL Oral TID BM Thurnell Lose, MD   237 mL at 03/03/19 0919  . guaiFENesin-dextromethorphan (ROBITUSSIN DM) 100-10 MG/5ML syrup 10 mL  10 mL Oral Q4H PRN Derrill Kay A, MD      .  latanoprost (XALATAN) 0.005 % ophthalmic solution 1 drop  1 drop Both Eyes QHS Davonna Belling, MD   1 drop at 03/02/19 2158  . levothyroxine (SYNTHROID) tablet 25 mcg  25 mcg Oral Daily Davonna Belling, MD   25 mcg at 03/03/19 0603  . lip balm (CARMEX) ointment   Topical PRN Phillips Grout, MD   Given at 02/26/19 2335  . loratadine (CLARITIN) tablet 10 mg  10 mg Oral Daily Davonna Belling, MD   10 mg at 03/03/19 0917  . losartan (COZAAR) tablet 50 mg  50 mg Oral Daily Davonna Belling, MD   50 mg at 03/03/19 0917  . Melatonin TABS 5 mg  5 mg Oral QHS PRN Peyton Bottoms, MD   5 mg at 03/02/19 2330  . metoprolol tartrate (LOPRESSOR) tablet 25 mg  25 mg Oral BID Davonna Belling, MD   25 mg at 03/03/19 0917  . ondansetron (ZOFRAN) injection 4 mg  4 mg Intravenous Q6H PRN Phillips Grout, MD      . Warfarin - Pharmacist Dosing Inpatient   Does not apply [id number] Rise Patience, MD   Stopped at 02/27/19 1800  . zinc sulfate capsule 220 mg  220 mg Oral Daily Derrill Kay A, MD   220 mg at 03/03/19 0917  . zolpidem (AMBIEN) tablet 5 mg  5 mg Oral QHS PRN Thurnell Lose, MD         Discharge Medications: Please see discharge summary for a list of discharge medications.  Relevant Imaging Results:  Relevant Lab Results:   Additional Information SSN [id number]    Barbette Or, [place]

## 2019-03-04 DIAGNOSIS — D6949 Other primary thrombocytopenia: Secondary | ICD-10-CM | POA: Diagnosis not present

## 2019-03-04 DIAGNOSIS — I700 Atherosclerosis of aorta: Secondary | ICD-10-CM | POA: Diagnosis not present

## 2019-03-04 DIAGNOSIS — W1789XA Other fall from one level to another, initial encounter: Secondary | ICD-10-CM | POA: Diagnosis not present

## 2019-03-04 DIAGNOSIS — E038 Other specified hypothyroidism: Secondary | ICD-10-CM | POA: Diagnosis not present

## 2019-03-04 DIAGNOSIS — R531 Weakness: Secondary | ICD-10-CM | POA: Diagnosis not present

## 2019-03-04 DIAGNOSIS — Z7901 Long term (current) use of anticoagulants: Secondary | ICD-10-CM | POA: Diagnosis not present

## 2019-03-04 DIAGNOSIS — R279 Unspecified lack of coordination: Secondary | ICD-10-CM | POA: Diagnosis not present

## 2019-03-04 DIAGNOSIS — J1282 Pneumonia due to coronavirus disease 2019: Secondary | ICD-10-CM | POA: Diagnosis not present

## 2019-03-04 DIAGNOSIS — I10 Essential (primary) hypertension: Secondary | ICD-10-CM | POA: Diagnosis not present

## 2019-03-04 DIAGNOSIS — R5381 Other malaise: Secondary | ICD-10-CM | POA: Diagnosis not present

## 2019-03-04 DIAGNOSIS — N390 Urinary tract infection, site not specified: Secondary | ICD-10-CM | POA: Diagnosis not present

## 2019-03-04 DIAGNOSIS — R000 Tachycardia, unspecified: Secondary | ICD-10-CM | POA: Diagnosis not present

## 2019-03-04 DIAGNOSIS — I2721 Secondary pulmonary arterial hypertension: Secondary | ICD-10-CM | POA: Diagnosis not present

## 2019-03-04 DIAGNOSIS — R52 Pain, unspecified: Secondary | ICD-10-CM | POA: Diagnosis not present

## 2019-03-04 DIAGNOSIS — I5032 Chronic diastolic (congestive) heart failure: Secondary | ICD-10-CM | POA: Diagnosis not present

## 2019-03-04 DIAGNOSIS — J188 Other pneumonia, unspecified organism: Secondary | ICD-10-CM | POA: Diagnosis not present

## 2019-03-04 DIAGNOSIS — D696 Thrombocytopenia, unspecified: Secondary | ICD-10-CM | POA: Diagnosis not present

## 2019-03-04 DIAGNOSIS — Z743 Need for continuous supervision: Secondary | ICD-10-CM | POA: Diagnosis not present

## 2019-03-04 DIAGNOSIS — U071 COVID-19: Secondary | ICD-10-CM | POA: Diagnosis not present

## 2019-03-04 DIAGNOSIS — J8489 Other specified interstitial pulmonary diseases: Secondary | ICD-10-CM | POA: Diagnosis not present

## 2019-03-04 DIAGNOSIS — R498 Other voice and resonance disorders: Secondary | ICD-10-CM | POA: Diagnosis not present

## 2019-03-04 DIAGNOSIS — R600 Localized edema: Secondary | ICD-10-CM | POA: Diagnosis not present

## 2019-03-04 DIAGNOSIS — R2689 Other abnormalities of gait and mobility: Secondary | ICD-10-CM | POA: Diagnosis not present

## 2019-03-04 DIAGNOSIS — R2681 Unsteadiness on feet: Secondary | ICD-10-CM | POA: Diagnosis not present

## 2019-03-04 DIAGNOSIS — J9601 Acute respiratory failure with hypoxia: Secondary | ICD-10-CM | POA: Diagnosis not present

## 2019-03-04 DIAGNOSIS — R008 Other abnormalities of heart beat: Secondary | ICD-10-CM | POA: Diagnosis not present

## 2019-03-04 DIAGNOSIS — D6851 Activated protein C resistance: Secondary | ICD-10-CM | POA: Diagnosis not present

## 2019-03-04 DIAGNOSIS — M6281 Muscle weakness (generalized): Secondary | ICD-10-CM | POA: Diagnosis not present

## 2019-03-04 DIAGNOSIS — R1312 Dysphagia, oropharyngeal phase: Secondary | ICD-10-CM | POA: Diagnosis not present

## 2019-03-04 DIAGNOSIS — Z5181 Encounter for therapeutic drug level monitoring: Secondary | ICD-10-CM | POA: Diagnosis not present

## 2019-03-04 DIAGNOSIS — R05 Cough: Secondary | ICD-10-CM | POA: Diagnosis not present

## 2019-03-04 LAB — GLUCOSE, CAPILLARY: Glucose-Capillary: 205 mg/dL — ABNORMAL HIGH (ref 70–99)

## 2019-03-04 LAB — PROTIME-INR
INR: 1.9 — ABNORMAL HIGH (ref 0.8–1.2)
Prothrombin Time: 22 seconds — ABNORMAL HIGH (ref 11.4–15.2)

## 2019-03-04 MED ORDER — WARFARIN SODIUM 7.5 MG PO TABS
7.5000 mg | ORAL_TABLET | Freq: Once | ORAL | Status: DC
  Filled 2019-03-04: qty 1

## 2019-03-04 MED ORDER — METHYLPREDNISOLONE 4 MG PO TBPK: ORAL_TABLET | ORAL | 0 refills | Status: DC

## 2019-03-04 NOTE — Progress Notes (Signed)
Called and gave report to [place], Therapist, sports at [facility] [place] [facility]. Patient is going to room 703-A. Family and patient aware. EMS to transport patient at the earliest noon. Will continue to monitor.

## 2019-03-04 NOTE — Plan of Care (Signed)

## 2019-03-04 NOTE — Progress Notes (Signed)
Patient discharged. EMS services here and transporting patient to [facility] as arranged. All belongings accounted for by patient including eye drops. All prescriptions, education, and documents reviewed and sent with patient. Iv deaccessed and purewick removed.

## 2019-03-04 NOTE — Discharge Summary (Signed)
Robin Harrell HYH:[id number] DOB: [birth year] DOA: 02/25/2019  PCP: [name], MD  Admit date: 02/25/2019  Discharge date: 03/04/2019  Admitted From: Home   Disposition:  SNF   Recommendations for Outpatient Follow-up:   Follow up with PCP in 1-2 weeks  PCP Please obtain BMP/CBC, 2 view CXR in 1week,  (see Discharge instructions)   PCP Please follow up on the following pending results:    Home Health: None   Equipment/Devices: None  Consultations: None  Discharge Condition: Stable    CODE STATUS: Full    Diet Recommendation: Heart Healthy - Soft    Chief Complaint  Patient presents with   Cough    + covid     Brief history of present illness from the day of admission and additional interim summary    Robin Harrell a 86 y.o.femalewith medical history significant offactor V Leyden deficiency, DVT, 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 She has been sick over the last week, he was diagnosed with COVID-19 pneumonia and admitted to the hospital.                                                                 Hospital Course      1.  Acute Covid 19 Viral Pneumonitis during the ongoing 2020 Covid 19 Pandemic - she has mild to moderate disease, was placed on IV steroids and remdesivir, she has completed her IV Rx and is symptom free on RA, will get PO steroid taper, her overall issue appears to be extreme weakness and deconditioning due to advanced age was seen by PT OT & will require SNF.     Recent Labs  Lab 02/25/19 2035 02/27/19 0400 02/28/19 0034 03/01/19 0206 03/02/19 0039 03/03/19 0500  CRP 23.9* 17.5* 10.0* 6.1* 5.0* 3.6*  DDIMER 1.05* 0.68* 0.43 0.37 0.45  --   FERRITIN 550*  --   --   --   --   --   BNP  --    --  152.6* 152.5* 263.5*  --   PROCALCITON <0.10  --   --   --   --   --     Hepatic Function Latest Ref Rng & Units 03/02/2019 03/01/2019 02/28/2019  Total Protein 6.5 - 8.1 g/dL 5.8(L) 5.6(L) 5.7(L)  Albumin 3.5 - 5.0 g/dL 2.7(L) 2.4(L) 2.5(L)  AST 15 - 41 U/L 27 31 32  ALT 0 - 44 U/L '25 26 27  '$ Alk Phosphatase 38 - 126 U/L 49 48 53  Total Bilirubin 0.3 - 1.2 mg/dL 0.8 0.4 0.5  Bilirubin, Direct 0.0 - 0.3 mg/dL - - -    2.  History of DVT factor V Leyden deficiency.  On Coumadin. Pharmacy monitoring INR.  Lab Results  Component Value Date   INR 1.9 (H) 03/04/2019   INR 2.0 (H) 03/03/2019   INR 2.0 (H) 03/02/2019    3.  Chronic idiopathic thrombocytopenia.  Stable no acute issues.   4.  Essential hypertension.  On combination of beta-blocker, ARB - stable.  5.  Dehydration, Hyponatremia, severe weakness and deconditioning.  PT, OT.  Will require SNF.  IV fluids were repeated on 02/28/2019 with good response.  6.  Chronic diastolic CHF.  60% in 2018.  Compensated.  Discharge diagnosis     Principal Problem:   Pneumonia due to COVID-19 virus Active Problems:   Essential hypertension   History of DVT (deep vein thrombosis)   Chronic idiopathic thrombocytopenia (HCC)   Diastolic dysfunction   Factor V deficiency ([place])   Acute respiratory failure due to COVID-19 [facility])    Discharge instructions    Discharge Instructions    Discharge instructions   Complete by: As directed    Follow with Primary MD [name], MD in 7 days   Get CBC, CMP, INR,  2 view Chest X ray -  checked next visit within 2 days by  SNF MD   Activity: As tolerated with Full fall precautions use walker/cane & assistance as needed  Disposition SNF  Diet: Soft, with feeding assistance and aspiration precautions.   Special Instructions: If you have smoked or chewed Tobacco  in the last 2 yrs please stop smoking, stop any regular Alcohol  and or any Recreational drug use.  On your next visit with  your primary care physician please Get Medicines reviewed and adjusted.  Please request your Prim.MD to go over all Hospital Tests and Procedure/Radiological results at the follow up, please get all Hospital records sent to your Prim MD by signing hospital release before you go home.  If you experience worsening of your admission symptoms, develop shortness of breath, life threatening emergency, suicidal or homicidal thoughts you must seek medical attention immediately by calling 911 or calling your MD immediately  if symptoms less severe.  You Must read complete instructions/literature along with all the possible adverse reactions/side effects for all the Medicines you take and that have been prescribed to you. Take any new Medicines after you have completely understood and accpet all the possible adverse reactions/side effects.   Increase activity slowly   Complete by: As directed       Discharge Medications   Allergies as of 03/04/2019      Reactions   Azithromycin Nausea Only   Codeine Nausea Only   Erythromycin Nausea And Vomiting      Medication List    STOP taking these medications   azithromycin 250 MG tablet Commonly known as: ZITHROMAX     TAKE these medications   acetaminophen 500 MG tablet Commonly known as: TYLENOL Take 1,000 mg by mouth every 6 (six) hours as needed for moderate pain.   atorvastatin 10 MG tablet Commonly known as: LIPITOR TAKE 1 TABLET (10 MG TOTAL) BY MOUTH DAILY AT 6 PM. What changed: when to take this   brimonidine 0.15 % ophthalmic solution Commonly known as: ALPHAGAN Place 2 drops into both eyes 2 (two) times daily.   Calcium/D3 Adult Gummies 200-96.6-200 MG-MG-UNIT Chew Generic drug: Calcium-Phosphorus-Vitamin D Chew 2 tablets by mouth daily.   cetirizine 10 MG tablet Commonly known as: ZYRTEC TAKE 1 TABLET BY MOUTH EVERY DAY   chlorpheniramine 4 MG tablet Commonly known as: CHLOR-TRIMETON Take 4 mg by mouth every morning.  fluticasone 50 MCG/ACT nasal spray Commonly known as: FLONASE Place 2 sprays into both nostrils daily.   ipratropium 0.03 % nasal spray Commonly known as: ATROVENT PLACE 2 SPRAYS INTO BOTH NOSTRILS 3 (THREE) TIMES DAILY AS NEEDED FOR RHINITIS.   latanoprost 0.005 % ophthalmic solution Commonly known as: XALATAN Place 1 drop into both eyes at bedtime.   levothyroxine 25 MCG tablet Commonly known as: SYNTHROID TAKE 1 TABLET BY MOUTH EVERY DAY What changed: when to take this   losartan 50 MG tablet Commonly known as: COZAAR Take 1 tablet (50 mg total) by mouth daily.   methylPREDNISolone 4 MG Tbpk tablet Commonly known as: MEDROL DOSEPAK follow package directions   metoprolol tartrate 25 MG tablet Commonly known as: LOPRESSOR TAKE 1 TABLET (25 MG TOTAL) BY MOUTH 2 (TWO) TIMES DAILY. *NEEDS OFFICE VISIT FOR FURTHER REFILLS*   multivitamin-iron-minerals-folic acid chewable tablet Chew 1 tablet by mouth daily.   vitamin E 1000 UNIT capsule Generic drug: vitamin E Take 1,000 Units by mouth daily.   warfarin 7.5 MG tablet Commonly known as: COUMADIN Take as directed. If you are unsure how to take this medication, talk to your nurse or doctor. Original instructions: TAKE 1/2 TO 1 TABLET BY MOUTH DAILY AS DIRECTED What changed: See the new instructions.        Contact information for follow-up providers    [name], MD. Schedule an appointment as soon as possible for a visit in 1 week(s).   Specialty: Family Medicine Contact information: [address] [place] [address] [phone]            Contact information for after-discharge care    Destination    [facility] Preferred [facility] .   Service: Skilled Nursing Contact information: [place] [phone]                  Major procedures and Radiology Reports - PLEASE review detailed and final reports thoroughly  -        CT  Angio Chest PE W and/or Wo Contrast  Result Date: 02/25/2019 CLINICAL DATA:  Shortness of breath. EXAM: CT ANGIOGRAPHY CHEST WITH CONTRAST TECHNIQUE: Multidetector CT imaging of the chest was performed using the standard protocol during bolus administration of intravenous contrast. Multiplanar CT image reconstructions and MIPs were obtained to evaluate the vascular anatomy. CONTRAST:  158m OMNIPAQUE IOHEXOL 350 MG/ML SOLN COMPARISON:  None. FINDINGS: Cardiovascular: Contrast injection is sufficient to demonstrate satisfactory opacification of the pulmonary arteries to the segmental level. There is no pulmonary embolus. The main pulmonary artery is dilated measuring approximately 3.4 cm in diameter. There is no CT evidence of acute right heart strain. There are atherosclerotic changes of the thoracic aorta. Heart size is enlarged. There is significant left atrial enlargement. Mitral valve calcifications are noted. Mediastinum/Nodes: --his likely reactive mediastinal and hilar adenopathy. --No axillary lymphadenopathy. --No supraclavicular lymphadenopathy. --Normal thyroid gland. --The esophagus is unremarkable Lungs/Pleura: There are diffuse bilateral ground-glass airspace opacities. There is a small right-sided pleural effusion with adjacent atelectasis. There is a small amount of atelectasis at the left lung base. The trachea is unremarkable. There is no pneumothorax. Upper Abdomen: No acute abnormality. Musculoskeletal: No chest wall abnormality. No acute or significant osseous findings. Review of the MIP images confirms the above findings. IMPRESSION: 1. No evidence for pulmonary embolus. 2. Diffuse bilateral ground-glass airspace opacities. This can be seen in patients with an atypical infectious process such as viral pneumonia  or developing pulmonary edema. 3. Cardiomegaly with significant left atrial enlargement. 4. Small right-sided pleural effusion with adjacent atelectasis. 5. Enlarged main pulmonary  artery, can be seen with pulmonary arterial hypertension. Aortic Atherosclerosis (ICD10-I70.0). Electronically Signed   By: Constance Holster M.D.   On: 02/25/2019 21:35   DG Chest Portable 1 View  Result Date: 02/25/2019 CLINICAL DATA:  Shortness of breath. Cough. Weakness. COVID-19. EXAM: PORTABLE CHEST 1 VIEW COMPARISON:  08/12/2018 and 08/11/2018 FINDINGS: The heart size and pulmonary vascularity are normal. Small focal area of infiltrate in the left upper lobe. Chronic slight accentuation of the interstitial markings as demonstrated on the prior chest CT. No acute bone abnormality. IMPRESSION: Small focal infiltrate in the left upper lobe. Mild chronic interstitial lung disease. Electronically Signed   By: Lorriane Shire M.D.   On: 02/25/2019 18:32    Micro Results     Recent Results (from the past 240 hour(s))  SARS Coronavirus 2 Ag (30 min TAT) - Nasal Swab (BD Veritor Kit)     Status: Abnormal   Collection Time: 02/25/19  3:46 PM   Specimen: Nasal Swab (BD Veritor Kit)  Result Value Ref Range Status   SARS Coronavirus 2 Ag POSITIVE (A) NEGATIVE Final    Comment: RESULT CALLED TO, READ BACK BY AND VERIFIED WITH:  MARVA SIMMS '@1627'$  ON 02/25/2019, CABELLERO.P (NOTE) SARS-CoV-2 antigen PRESENT. Positive results indicate the presence of viral antigens, but clinical correlation with patient history and other diagnostic information is necessary to determine patient infection status.  Positive results do not rule out bacterial infection or co-infection  with other viruses. False positive results are rare but can occur, and confirmatory RT-PCR testing may be appropriate in some circumstances. The expected result is Negative. Fact Sheet for Patients: PodPark.tn Fact Sheet for Providers: GiftContent.is  This test is not yet approved or cleared by the [place] FDA and  has been authorized for detection and/or diagnosis of  SARS-CoV-2 by FDA under an Emergency Use Authorization (EUA).  This EUA will remain in effect (meaning this test can be used) for the duration of  the COVID- 19 declaration under Section 564(b)(1) of the Act, 21 U.S.C. section 360bbb-3(b)(1), unless the authorization is terminated or revoked sooner. Performed at [facility], [address] [place], [place] [address]   Blood Culture (routine x 2)     Status: None   Collection Time: 02/25/19  8:35 PM   Specimen: BLOOD  Result Value Ref Range Status   Specimen Description   Final    BLOOD RIGHT ANTECUBITAL Performed at [facility], [address] [place], [place] [address]    Special Requests   Final    BOTTLES DRAWN AEROBIC AND ANAEROBIC Blood Culture adequate volume Performed at [facility], [address] [place], [place] [address]    Culture   Final    NO GROWTH 5 DAYS Performed at [facility], [place] [address] [place], [place] [address]    Report Status 03/02/2019 FINAL  Final  Blood Culture (routine x 2)     Status: None   Collection Time: 02/25/19  9:00 PM   Specimen: BLOOD  Result Value Ref Range Status   Specimen Description   Final    BLOOD LEFT ANTECUBITAL Performed at [facility], [address] [place], [place] [address]    Special Requests   Final    BOTTLES DRAWN AEROBIC AND ANAEROBIC Blood Culture  adequate volume Performed at [facility], [address] [place], [place] [address]    Culture   Final    NO GROWTH 5 DAYS Performed at [facility], [place] [address] [place], [place] [address]    Report Status 03/02/2019 FINAL  Final  Culture, Urine     Status: Abnormal (Preliminary result)   Collection Time: 03/02/19  5:30 PM   Specimen: Urine, Clean Catch  Result Value Ref Range Status   Specimen Description   Final    URINE, CLEAN CATCH Performed at [facility], [place] [address], [place], [place] [address]     Special Requests   Final    NONE Performed at [facility], [place] [address] [place], [place] [address]    Culture (A)  Final    >=100,000 COLONIES/mL GRAM NEGATIVE RODS IDENTIFICATION AND SUSCEPTIBILITIES TO FOLLOW Performed at [facility], [place] [address], [place], [place] [address]    Report Status PENDING  Incomplete    Today   Subjective    Robin Harrell today has no headache,no chest abdominal pain,no new weakness tingling or numbness, feels much better     Objective   Blood pressure (!) 115/59, pulse 89, temperature 97.8 F (36.6 C), temperature source Axillary, resp. rate 16, height '5\' 2"'$  (1.575 m), weight 57.8 kg, SpO2 98 %.   Intake/Output Summary (Last 24 hours) at 03/04/2019 0943 Last data filed at 03/03/2019 1900 Gross per 24 hour  Intake --  Output 801 ml  Net -801 ml    Exam Awake Alert,  No new F.N deficits, Normal affect [name].AT,PERRAL Supple Neck,No JVD, No cervical lymphadenopathy appriciated.  Symmetrical Chest wall movement, Good air movement bilaterally, CTAB RRR,No Gallops,Rubs or new Murmurs, No Parasternal Heave +ve B.Sounds, Abd Soft, Non tender, No organomegaly appriciated, No rebound -guarding or rigidity. No Cyanosis, Clubbing or edema, No new Rash or bruise   Data Review   CBC w Diff:  Lab Results  Component Value Date   WBC 9.4 03/02/2019   HGB 10.4 (L) 03/02/2019   HCT 31.2 (L) 03/02/2019   PLT 241 03/02/2019   LYMPHOPCT 21 03/02/2019   MONOPCT 8 03/02/2019   EOSPCT 1 03/02/2019   BASOPCT 1 03/02/2019    CMP:  Lab Results  Component Value Date   NA 136 03/02/2019   K 4.5 03/02/2019   CL 102 03/02/2019   CO2 24 03/02/2019   BUN 33 (H) 03/02/2019   CREATININE 0.85 03/02/2019   PROT 5.8 (L) 03/02/2019   ALBUMIN 2.7 (L) 03/02/2019   BILITOT 0.8 03/02/2019   ALKPHOS 49 03/02/2019   AST 27 03/02/2019   ALT 25 03/02/2019  .   Total Time in preparing paper work, data evaluation and todays exam -  36 minutes  Lala Lund M.D on 03/04/2019 at 9:43 AM  [facility]   Office  [phone]

## 2019-03-04 NOTE — Progress Notes (Signed)
ANTICOAGULATION CONSULT NOTE - Follow Up Consult  Pharmacy Consult for Coumadin Indication: Factor V Leiden  Allergies  Allergen Reactions  . Azithromycin Nausea Only  . Codeine Nausea Only  . Erythromycin Nausea And Vomiting    Patient Measurements: Height: 5\' 2"  (157.5 cm) Weight: 127 lb 6.8 oz (57.8 kg) IBW/kg (Calculated) : 50.1  Vital Signs: Temp: 97.8 F (36.6 C) (01/11 0751) Temp Source: Axillary (01/11 0751) BP: 115/59 (01/11 0751) Pulse Rate: 89 (01/11 0751)  Labs: Recent Labs    03/02/19 0039 03/03/19 0500 03/04/19 0420  HGB 10.4*  --   --   HCT 31.2*  --   --   PLT 241  --   --   LABPROT 22.6* 22.7* 22.0*  INR 2.0* 2.0* 1.9*  CREATININE 0.85  --   --     Estimated Creatinine Clearance: 39.7 mL/min (by C-G formula based on SCr of 0.85 mg/dL).   Assessment: 86 y.o. female on Coumadin 3.75mg  daily exc 7.5mg  on MWF PTA for factor V Leiden deficiency. INR now down/stable to 1.9. Hgb low but stable and plts wnl.  PO intake remains poor however consuming Ensure which has vitamin K. No significant drug interactions noted.   Goal of Therapy:  INR 2-3 Monitor platelets by anticoagulation protocol: Yes   Plan:  Give Coumadin 7.5mg  PO x 1 tonight Monitor daily INR, CBC, s/s of bleed  Elenor Quinones, PharmD, BCPS, BCIDP Clinical Pharmacist 03/04/2019 8:17 AM

## 2019-03-04 NOTE — Discharge Instructions (Signed)
Follow with Primary MD [name], MD in 7 days   Get CBC, CMP, INR,  2 view Chest X ray -  checked next visit within 2 days by  SNF MD   Activity: As tolerated with Full fall precautions use walker/cane & assistance as needed  Disposition SNF  Diet: Soft, with feeding assistance and aspiration precautions.   Special Instructions: If you have smoked or chewed Tobacco  in the last 2 yrs please stop smoking, stop any regular Alcohol  and or any Recreational drug use.  On your next visit with your primary care physician please Get Medicines reviewed and adjusted.  Please request your Prim.MD to go over all Hospital Tests and Procedure/Radiological results at the follow up, please get all Hospital records sent to your Prim MD by signing hospital release before you go home.  If you experience worsening of your admission symptoms, develop shortness of breath, life threatening emergency, suicidal or homicidal thoughts you must seek medical attention immediately by calling 911 or calling your MD immediately  if symptoms less severe.  You Must read complete instructions/literature along with all the possible adverse reactions/side effects for all the Medicines you take and that have been prescribed to you. Take any new Medicines after you have completely understood and accpet all the possible adverse reactions/side effects.

## 2019-03-04 NOTE — Progress Notes (Signed)
In room to take pt vitals pt states she had to wait for 3 hours last night for someone to turn off her TV.  Pt states she kept pushing the nurse button on the bed.  I apologized and informed the pt that those buttons do not work to get the nurse.  States she could not find the other one.  Found it for patient under her pillow.  Patient seems frustrated also that she was unable to sleep again tonight.  Was given her PRN melatonin at bedtime and also discussed giving her the Ambien that is on her med list because pt had not slept well the night before.  Patient stated that she had taken it some years ago and it had not worked for her.  Asks if there is anything else I can do.  Told the patient that where it is almost daytime that I did not know what I could do and asked if she could think of anything I could do to help her.  She states she can't think of anything either.

## 2019-03-04 NOTE — TOC Progression Note (Signed)
Transition of Care [facility]) - Progression Note    Patient Details  Name: Robin Harrell MRN: [id number] Date of Birth: 1933-09-09  [facility]) CM/SW Contact  Joaquin Courts, RN Phone Number: 03/04/2019, 10:41 AM  Clinical Narrative:   Cm spoke with [facility] rep and confirmed bed availability for today. Patient will dc to room 703-A and transport by [facility]. Transportation scheduled for 1200 (per facility earliest they can accept). CM spoke with daughter and updated her on dc plan.  Bedside RN please call report to [phone]. Please place facesheet and medical necessity form along with any written prescriptions into dc envelope.      Expected Discharge Plan: [place] Barriers to Discharge: No Barriers Identified  Expected Discharge Plan and Services Expected Discharge Plan: [place] Choice: [place] arrangements for the past 2 months: Single Family Home Expected Discharge Date: 03/04/19                                     Social Determinants of Health (SDOH) Interventions    Readmission Risk Interventions No flowsheet data found.

## 2019-03-04 NOTE — Care Management Important Message (Signed)
Important Message  Patient Details  Name: Robin Harrell MRN: [id number] Date of Birth: 1934-02-13   Medicare Important Message Given:  Yes - Important Message mailed due to current National Emergency  Verbal consent obtained due to current National Emergency  Relationship to patient: Child Contact Name: Loraine Leriche Call Date: 03/04/19  Time: [id number] Phone: [phone] Outcome: Spoke with contact Important Message mailed to: Patient address on file    Delorse Lek 03/04/2019, 9:52 AM

## 2019-03-05 DIAGNOSIS — U071 COVID-19: Secondary | ICD-10-CM | POA: Diagnosis not present

## 2019-03-05 DIAGNOSIS — D6851 Activated protein C resistance: Secondary | ICD-10-CM | POA: Diagnosis not present

## 2019-03-05 DIAGNOSIS — I10 Essential (primary) hypertension: Secondary | ICD-10-CM | POA: Diagnosis not present

## 2019-03-05 DIAGNOSIS — D696 Thrombocytopenia, unspecified: Secondary | ICD-10-CM | POA: Diagnosis not present

## 2019-03-05 LAB — URINE CULTURE: Culture: >=100000 — AB

## 2019-03-05 NOTE — Progress Notes (Signed)
Please call pt or her daughter. I believe she is in a SNF; Her urine culture shows infection: I just want to be sure that this is followed and treated. thanks

## 2019-03-06 ENCOUNTER — Encounter: Payer: Self-pay | Admitting: Family Medicine

## 2019-03-07 ENCOUNTER — Other Ambulatory Visit (INDEPENDENT_AMBULATORY_CARE_PROVIDER_SITE_OTHER): Payer: Medicare Other

## 2019-03-07 ENCOUNTER — Telehealth: Payer: Self-pay | Admitting: Family Medicine

## 2019-03-07 DIAGNOSIS — Z7901 Long term (current) use of anticoagulants: Secondary | ICD-10-CM

## 2019-03-07 NOTE — Telephone Encounter (Signed)
[facility] is calling on behalf of the patient and is needing to a copy of the urine culture so they can treat her. Robin Harrell sates if it is not received today then, they will have to repeat this culture.  Fax:[phone]

## 2019-03-07 NOTE — Telephone Encounter (Signed)
Pleas advise.

## 2019-03-07 NOTE — Telephone Encounter (Signed)
Please send them the recent urine culture report

## 2019-03-07 NOTE — Telephone Encounter (Signed)
Urine culture report faxed to number listed below

## 2019-03-08 ENCOUNTER — Telehealth: Payer: Self-pay | Admitting: Family Medicine

## 2019-03-08 ENCOUNTER — Encounter: Payer: Self-pay | Admitting: Family Medicine

## 2019-03-08 NOTE — Telephone Encounter (Signed)
Pt daughter called stating Director Of Nursing at [facility] where pt lives needs [place] to send her a copy of the pts urine culture so they can treat the pt. DON contact is [employer]. Fax number is [phone]. DON direct number is [phone]. Please advise.

## 2019-03-08 NOTE — Telephone Encounter (Signed)
Urine results re-faxed again. Called [phone], no answer. I was unable to leave a voicemail.

## 2019-03-11 ENCOUNTER — Ambulatory Visit: Payer: Medicare Other | Admitting: Podiatry

## 2019-03-12 ENCOUNTER — Ambulatory Visit: Payer: Medicare Other | Admitting: Cardiovascular Disease

## 2019-03-12 DIAGNOSIS — R5381 Other malaise: Secondary | ICD-10-CM | POA: Diagnosis not present

## 2019-03-12 DIAGNOSIS — W1789XA Other fall from one level to another, initial encounter: Secondary | ICD-10-CM | POA: Diagnosis not present

## 2019-03-12 DIAGNOSIS — Z5181 Encounter for therapeutic drug level monitoring: Secondary | ICD-10-CM | POA: Diagnosis not present

## 2019-03-12 DIAGNOSIS — Z7901 Long term (current) use of anticoagulants: Secondary | ICD-10-CM | POA: Diagnosis not present

## 2019-03-14 ENCOUNTER — Encounter: Payer: Self-pay | Admitting: Family Medicine

## 2019-03-14 DIAGNOSIS — U071 COVID-19: Secondary | ICD-10-CM | POA: Diagnosis not present

## 2019-03-14 DIAGNOSIS — J188 Other pneumonia, unspecified organism: Secondary | ICD-10-CM | POA: Diagnosis not present

## 2019-03-14 DIAGNOSIS — N390 Urinary tract infection, site not specified: Secondary | ICD-10-CM | POA: Diagnosis not present

## 2019-03-14 DIAGNOSIS — I10 Essential (primary) hypertension: Secondary | ICD-10-CM | POA: Diagnosis not present

## 2019-03-17 ENCOUNTER — Other Ambulatory Visit: Payer: Self-pay | Admitting: Cardiovascular Disease

## 2019-03-19 ENCOUNTER — Other Ambulatory Visit: Payer: Self-pay

## 2019-03-19 DIAGNOSIS — R000 Tachycardia, unspecified: Secondary | ICD-10-CM | POA: Diagnosis not present

## 2019-03-19 DIAGNOSIS — R600 Localized edema: Secondary | ICD-10-CM | POA: Diagnosis not present

## 2019-03-19 DIAGNOSIS — I10 Essential (primary) hypertension: Secondary | ICD-10-CM | POA: Diagnosis not present

## 2019-03-19 MED ORDER — LOSARTAN POTASSIUM 50 MG PO TABS: 50.0000 mg | ORAL_TABLET | Freq: Every day | ORAL | 1 refills | Status: DC

## 2019-03-23 ENCOUNTER — Other Ambulatory Visit: Payer: Self-pay | Admitting: Emergency Medicine

## 2019-03-25 DIAGNOSIS — D6851 Activated protein C resistance: Secondary | ICD-10-CM | POA: Diagnosis not present

## 2019-03-25 DIAGNOSIS — R008 Other abnormalities of heart beat: Secondary | ICD-10-CM | POA: Diagnosis not present

## 2019-03-25 DIAGNOSIS — J8489 Other specified interstitial pulmonary diseases: Secondary | ICD-10-CM | POA: Diagnosis not present

## 2019-03-25 DIAGNOSIS — R600 Localized edema: Secondary | ICD-10-CM | POA: Diagnosis not present

## 2019-03-25 DIAGNOSIS — D6949 Other primary thrombocytopenia: Secondary | ICD-10-CM | POA: Diagnosis not present

## 2019-03-25 DIAGNOSIS — I5032 Chronic diastolic (congestive) heart failure: Secondary | ICD-10-CM | POA: Diagnosis not present

## 2019-03-25 DIAGNOSIS — I700 Atherosclerosis of aorta: Secondary | ICD-10-CM | POA: Diagnosis not present

## 2019-03-25 DIAGNOSIS — I2721 Secondary pulmonary arterial hypertension: Secondary | ICD-10-CM | POA: Diagnosis not present

## 2019-03-25 DIAGNOSIS — E038 Other specified hypothyroidism: Secondary | ICD-10-CM | POA: Diagnosis not present

## 2019-03-27 ENCOUNTER — Ambulatory Visit: Payer: Medicare Other | Attending: Internal Medicine

## 2019-03-27 DIAGNOSIS — Z20822 Contact with and (suspected) exposure to covid-19: Secondary | ICD-10-CM

## 2019-03-28 LAB — NOVEL CORONAVIRUS, NAA: SARS-CoV-2, NAA: NOT DETECTED

## 2019-03-29 DIAGNOSIS — I5032 Chronic diastolic (congestive) heart failure: Secondary | ICD-10-CM | POA: Diagnosis not present

## 2019-03-29 DIAGNOSIS — J1282 Pneumonia due to coronavirus disease 2019: Secondary | ICD-10-CM | POA: Diagnosis not present

## 2019-03-29 DIAGNOSIS — D682 Hereditary deficiency of other clotting factors: Secondary | ICD-10-CM | POA: Diagnosis not present

## 2019-03-29 DIAGNOSIS — I110 Hypertensive heart disease with heart failure: Secondary | ICD-10-CM | POA: Diagnosis not present

## 2019-03-29 DIAGNOSIS — U071 COVID-19: Secondary | ICD-10-CM | POA: Diagnosis not present

## 2019-03-29 DIAGNOSIS — I872 Venous insufficiency (chronic) (peripheral): Secondary | ICD-10-CM | POA: Diagnosis not present

## 2019-03-29 DIAGNOSIS — G250 Essential tremor: Secondary | ICD-10-CM | POA: Diagnosis not present

## 2019-03-29 DIAGNOSIS — J9600 Acute respiratory failure, unspecified whether with hypoxia or hypercapnia: Secondary | ICD-10-CM | POA: Diagnosis not present

## 2019-03-29 DIAGNOSIS — M159 Polyosteoarthritis, unspecified: Secondary | ICD-10-CM | POA: Diagnosis not present

## 2019-03-29 DIAGNOSIS — Z86718 Personal history of other venous thrombosis and embolism: Secondary | ICD-10-CM | POA: Diagnosis not present

## 2019-03-29 DIAGNOSIS — Z7951 Long term (current) use of inhaled steroids: Secondary | ICD-10-CM | POA: Diagnosis not present

## 2019-03-29 DIAGNOSIS — M47816 Spondylosis without myelopathy or radiculopathy, lumbar region: Secondary | ICD-10-CM | POA: Diagnosis not present

## 2019-03-29 DIAGNOSIS — M4010 Other secondary kyphosis, site unspecified: Secondary | ICD-10-CM | POA: Diagnosis not present

## 2019-03-29 DIAGNOSIS — Z7901 Long term (current) use of anticoagulants: Secondary | ICD-10-CM | POA: Diagnosis not present

## 2019-03-29 DIAGNOSIS — M810 Age-related osteoporosis without current pathological fracture: Secondary | ICD-10-CM | POA: Diagnosis not present

## 2019-04-02 ENCOUNTER — Other Ambulatory Visit: Payer: Self-pay

## 2019-04-02 ENCOUNTER — Encounter: Payer: Self-pay | Admitting: Podiatry

## 2019-04-02 ENCOUNTER — Ambulatory Visit (INDEPENDENT_AMBULATORY_CARE_PROVIDER_SITE_OTHER): Payer: Medicare Other | Admitting: Podiatry

## 2019-04-02 DIAGNOSIS — D6851 Activated protein C resistance: Secondary | ICD-10-CM

## 2019-04-02 DIAGNOSIS — U071 COVID-19: Secondary | ICD-10-CM | POA: Diagnosis not present

## 2019-04-02 DIAGNOSIS — I110 Hypertensive heart disease with heart failure: Secondary | ICD-10-CM | POA: Diagnosis not present

## 2019-04-02 DIAGNOSIS — J9600 Acute respiratory failure, unspecified whether with hypoxia or hypercapnia: Secondary | ICD-10-CM | POA: Diagnosis not present

## 2019-04-02 DIAGNOSIS — M79676 Pain in unspecified toe(s): Secondary | ICD-10-CM | POA: Diagnosis not present

## 2019-04-02 DIAGNOSIS — I5032 Chronic diastolic (congestive) heart failure: Secondary | ICD-10-CM | POA: Diagnosis not present

## 2019-04-02 DIAGNOSIS — L84 Corns and callosities: Secondary | ICD-10-CM

## 2019-04-02 DIAGNOSIS — D682 Hereditary deficiency of other clotting factors: Secondary | ICD-10-CM | POA: Diagnosis not present

## 2019-04-02 DIAGNOSIS — J1282 Pneumonia due to coronavirus disease 2019: Secondary | ICD-10-CM | POA: Diagnosis not present

## 2019-04-02 DIAGNOSIS — B351 Tinea unguium: Secondary | ICD-10-CM

## 2019-04-02 NOTE — Patient Instructions (Signed)

## 2019-04-03 DIAGNOSIS — U071 COVID-19: Secondary | ICD-10-CM | POA: Diagnosis not present

## 2019-04-03 DIAGNOSIS — J9600 Acute respiratory failure, unspecified whether with hypoxia or hypercapnia: Secondary | ICD-10-CM | POA: Diagnosis not present

## 2019-04-03 DIAGNOSIS — I5032 Chronic diastolic (congestive) heart failure: Secondary | ICD-10-CM | POA: Diagnosis not present

## 2019-04-03 DIAGNOSIS — J1282 Pneumonia due to coronavirus disease 2019: Secondary | ICD-10-CM | POA: Diagnosis not present

## 2019-04-03 DIAGNOSIS — I110 Hypertensive heart disease with heart failure: Secondary | ICD-10-CM | POA: Diagnosis not present

## 2019-04-03 DIAGNOSIS — D682 Hereditary deficiency of other clotting factors: Secondary | ICD-10-CM | POA: Diagnosis not present

## 2019-04-04 DIAGNOSIS — D682 Hereditary deficiency of other clotting factors: Secondary | ICD-10-CM | POA: Diagnosis not present

## 2019-04-04 DIAGNOSIS — I5032 Chronic diastolic (congestive) heart failure: Secondary | ICD-10-CM | POA: Diagnosis not present

## 2019-04-04 DIAGNOSIS — J9600 Acute respiratory failure, unspecified whether with hypoxia or hypercapnia: Secondary | ICD-10-CM | POA: Diagnosis not present

## 2019-04-04 DIAGNOSIS — J1282 Pneumonia due to coronavirus disease 2019: Secondary | ICD-10-CM | POA: Diagnosis not present

## 2019-04-04 DIAGNOSIS — I110 Hypertensive heart disease with heart failure: Secondary | ICD-10-CM | POA: Diagnosis not present

## 2019-04-04 DIAGNOSIS — U071 COVID-19: Secondary | ICD-10-CM | POA: Diagnosis not present

## 2019-04-05 ENCOUNTER — Telehealth: Payer: Self-pay | Admitting: Family Medicine

## 2019-04-05 DIAGNOSIS — J9600 Acute respiratory failure, unspecified whether with hypoxia or hypercapnia: Secondary | ICD-10-CM | POA: Diagnosis not present

## 2019-04-05 DIAGNOSIS — I110 Hypertensive heart disease with heart failure: Secondary | ICD-10-CM | POA: Diagnosis not present

## 2019-04-05 DIAGNOSIS — J1282 Pneumonia due to coronavirus disease 2019: Secondary | ICD-10-CM | POA: Diagnosis not present

## 2019-04-05 DIAGNOSIS — U071 COVID-19: Secondary | ICD-10-CM | POA: Diagnosis not present

## 2019-04-05 DIAGNOSIS — I5032 Chronic diastolic (congestive) heart failure: Secondary | ICD-10-CM | POA: Diagnosis not present

## 2019-04-05 DIAGNOSIS — D682 Hereditary deficiency of other clotting factors: Secondary | ICD-10-CM | POA: Diagnosis not present

## 2019-04-05 NOTE — Progress Notes (Signed)
Subjective: Robin Harrell presents today for follow up of corn(s) , right 3rd digit, painful and chronic in nature and painful mycotic toenails b/l that are difficult to trim. Pain interferes with ambulation. Aggravating factors include wearing enclosed shoe gear. Pain is relieved with periodic professional debridement.   Patient states she did contract COVID-19 and was hospitalized, followed by rehab. She is now home and feels she is getting better, although slowly.  Allergies  Allergen Reactions  . Azithromycin Nausea Only  . Codeine Nausea Only  . Erythromycin Nausea And Vomiting    Objective: There were no vitals filed for this visit.  Vascular Examination:  Capillary fill time to digits <3s b/l, palpable DP pulses b/l, palpable PT pulses b/l, pedal hair present b/l and skin temperature gradient within normal limits b/l  Dermatological Examination: Pedal skin with normal turgor, texture and tone bilaterally, no open wounds bilaterally, no interdigital macerations bilaterally, toenails 1-5 b/l elongated, dystrophic, thickened, crumbly with subungual debris and hyperkeratotic lesion(s) distal tip right 3rd digit with subdermal hemorrhage. +Tenderness to palpation. No erythema, no edema, no drainage, no flocculence.Marland Kitchen  No erythema, no edema, no drainage, no flocculence  Musculoskeletal: Normal muscle strength 5/5 to all lower extremity muscle groups bilaterally, no pain crepitus or joint limitation noted with ROM b/l and hammertoes noted to the  2-5 bilaterally  Neurological: Protective sensation intact 5/5 intact bilaterally with 10g monofilament b/l and vibratory sensation intact b/l  Assessment: 1. Pain due to onychomycosis of toenail   2. Pre-ulcerative corn or callous   3. Factor V Leiden ([place])    Plan: -Toenails 1-5 b/l were debrided in length and girth without iatrogenic bleeding. -corn(s) debrided right 3rd digit without complication or incident. Total number debrided=1.  Dispensed Silipos toe caps.  -Patient to continue soft, supportive shoe gear daily. -Patient to report any pedal injuries to medical professional immediately. -Patient/POA to call should there be question/concern in the interim.  Return in about 3 months (around 06/30/2019) for nail and callus trim.

## 2019-04-05 NOTE — Telephone Encounter (Signed)
Sharyn Lull form [facility] is calling to inform Robin Harrell that they have completed evaluation, want to continue with OT for 1 once a week for a week, then go to 3 times a week for 2 weeks.

## 2019-04-05 NOTE — Telephone Encounter (Signed)
Did Sharyn Lull from [facility] leave a callback number?

## 2019-04-05 NOTE — Telephone Encounter (Signed)
Sorry, thought I had her information in, and I no longer have the number

## 2019-04-08 ENCOUNTER — Telehealth: Payer: Self-pay

## 2019-04-08 ENCOUNTER — Other Ambulatory Visit: Payer: Self-pay

## 2019-04-08 ENCOUNTER — Encounter: Payer: Self-pay | Admitting: Pulmonary Disease

## 2019-04-08 ENCOUNTER — Ambulatory Visit (INDEPENDENT_AMBULATORY_CARE_PROVIDER_SITE_OTHER): Payer: Medicare Other | Admitting: Pulmonary Disease

## 2019-04-08 DIAGNOSIS — I110 Hypertensive heart disease with heart failure: Secondary | ICD-10-CM

## 2019-04-08 DIAGNOSIS — M47816 Spondylosis without myelopathy or radiculopathy, lumbar region: Secondary | ICD-10-CM

## 2019-04-08 DIAGNOSIS — Z86718 Personal history of other venous thrombosis and embolism: Secondary | ICD-10-CM

## 2019-04-08 DIAGNOSIS — J302 Other seasonal allergic rhinitis: Secondary | ICD-10-CM

## 2019-04-08 DIAGNOSIS — M4010 Other secondary kyphosis, site unspecified: Secondary | ICD-10-CM

## 2019-04-08 DIAGNOSIS — Z7901 Long term (current) use of anticoagulants: Secondary | ICD-10-CM

## 2019-04-08 DIAGNOSIS — U071 COVID-19: Secondary | ICD-10-CM

## 2019-04-08 DIAGNOSIS — J3089 Other allergic rhinitis: Secondary | ICD-10-CM | POA: Diagnosis not present

## 2019-04-08 DIAGNOSIS — M810 Age-related osteoporosis without current pathological fracture: Secondary | ICD-10-CM

## 2019-04-08 DIAGNOSIS — D682 Hereditary deficiency of other clotting factors: Secondary | ICD-10-CM

## 2019-04-08 DIAGNOSIS — J9600 Acute respiratory failure, unspecified whether with hypoxia or hypercapnia: Secondary | ICD-10-CM

## 2019-04-08 DIAGNOSIS — Z7951 Long term (current) use of inhaled steroids: Secondary | ICD-10-CM

## 2019-04-08 DIAGNOSIS — I872 Venous insufficiency (chronic) (peripheral): Secondary | ICD-10-CM

## 2019-04-08 DIAGNOSIS — J1282 Pneumonia due to coronavirus disease 2019: Secondary | ICD-10-CM

## 2019-04-08 DIAGNOSIS — G250 Essential tremor: Secondary | ICD-10-CM

## 2019-04-08 DIAGNOSIS — I5032 Chronic diastolic (congestive) heart failure: Secondary | ICD-10-CM

## 2019-04-08 DIAGNOSIS — M159 Polyosteoarthritis, unspecified: Secondary | ICD-10-CM

## 2019-04-08 MED ORDER — AMOXICILLIN-POT CLAVULANATE 875-125 MG PO TABS: 1.0000 | ORAL_TABLET | Freq: Two times a day (BID) | ORAL | 0 refills | Status: DC

## 2019-04-08 NOTE — Assessment & Plan Note (Addendum)
Plan: Continue ipratropium nasal spray 3 times daily Continue nasal saline rinses at least 1-2 times daily prior to ipratropium nasal spray Continue Zyrtec Continue chlor tabs Can use Flonase as needed for nasal congestion Can continue to use regular Mucinex as needed for chest congestion Follow-up with our office in 4 to 6 weeks

## 2019-04-08 NOTE — Assessment & Plan Note (Signed)
Plan:  continue coumadin  Call coumadin clinic and notify that you are starting Augmentin

## 2019-04-08 NOTE — Telephone Encounter (Signed)
Called and rescheduled the pt

## 2019-04-08 NOTE — Patient Instructions (Addendum)
You were seen today by Lauraine Rinne, NP  for:   1. Pneumonia due to COVID-19 virus  I am concerned he may not have a bacterial infection after your likely COVID-19 pneumonia in January/2021  - amoxicillin-clavulanate (AUGMENTIN) 875-125 MG tablet; Take 1 tablet by mouth 2 (two) times daily.  Dispense: 14 tablet; Refill: 0 - DG Chest 2 View; Future  We will get you scheduled for a follow-up in office with a chest x-ray sometime over the next 4 to 6 weeks  If symptoms are not improving please contact our office.  2. Seasonal and perennial allergic rhinitis  Continue nasal medications as recommended.  Can take ipratropium 3 times daily  Maintain that you are doing nasal saline rinses twice daily prior to ipratropium nasal spray  Can use Flonase as needed  Continue Zyrtec daily  Can use Chlortab's in the evening, these are sedating  3. Long term (current) use of anticoagulants 4. History of DVT (deep vein thrombosis)  Continue Coumadin  Notify Coumadin clinic that you are starting Augmentin   We recommend today:  Orders Placed This Encounter  Procedures  . DG Chest 2 View    Standing Status:   Future    Standing Expiration Date:   06/05/2020    Order Specific Question:   Reason for Exam (SYMPTOM  OR DIAGNOSIS REQUIRED)    Answer:   s/p covid19 jan/2021    Order Specific Question:   Preferred imaging location?    Answer:   Internal    Order Specific Question:   Radiology Contrast Protocol - do NOT remove file path    Answer:   [email or web address]   Orders Placed This Encounter  Procedures  . DG Chest 2 View   Meds ordered this encounter  Medications  . amoxicillin-clavulanate (AUGMENTIN) 875-125 MG tablet    Sig: Take 1 tablet by mouth 2 (two) times daily.    Dispense:  14 tablet    Refill:  0    Follow Up:    Return in about 6 weeks (around 05/20/2019), or if symptoms worsen or fail to improve, for With Chest Xray, Follow  up with Dr. Lamonte Sakai, Follow up with Wyn Quaker FNP-C.   Please do your part to reduce the spread of COVID-19:      Reduce your risk of any infection  and COVID19 by using the similar precautions used for avoiding the common cold or flu:  Marland Kitchen Wash your hands often with soap and warm water for at least 20 seconds.  If soap and water are not readily available, use an alcohol-based hand sanitizer with at least 60% alcohol.  . If coughing or sneezing, cover your mouth and nose by coughing or sneezing into the elbow areas of your shirt or coat, into a tissue or into your sleeve (not your hands). Langley Gauss A MASK when in public  . Avoid shaking hands with others and consider head nods or verbal greetings only. . Avoid touching your eyes, nose, or mouth with unwashed hands.  . Avoid close contact with people who are sick. . Avoid places or events with large numbers of people in one location, like concerts or sporting events. . If you have some symptoms but not all symptoms, continue to monitor at home and seek medical attention if your symptoms worsen. . If you are having a medical emergency, call 911.   ADDITIONAL HEALTHCARE OPTIONS FOR PATIENTS  [facility] / e-Visit: [email or web address]  [facility]: [facility]: [id number]                   [facility]: [id number]     It is flu season:   >>> Best ways to protect herself from the flu: Receive the yearly flu vaccine, practice good hand hygiene washing with soap and also using hand sanitizer when available, eat a nutritious meals, get adequate rest, hydrate appropriately   Please contact the office if your symptoms worsen or you have concerns that you are not improving.   Thank you for choosing [facility] for your healthcare, and for allowing [place] to partner with you on your healthcare journey. I am thankful to be  able to provide care to you today.   Wyn Quaker FNP-C

## 2019-04-08 NOTE — Progress Notes (Signed)
Virtual Visit via Telephone Note  I connected with Robin Harrell on 04/08/19 at  3:00 PM EST by telephone and verified that I am speaking with the correct person using two identifiers.  Location: Patient: Home Provider: [facility] - [id number], [place], [place], [place] [address]   I discussed the limitations, risks, security and privacy concerns of performing an evaluation and management service by telephone and the availability of in person appointments. I also discussed with the patient that there may be a patient responsible charge related to this service. The patient expressed understanding and agreed to proceed.  Patient consented to consult via telephone: Yes People present and their role in pt care: Pt    History of Present Illness:  86 year old female former smoker followed in our office for COPD, chronic rhinitis, chronic cough  Past medical history: History of factor V (managed on chronic Coumadin), history of PE, GERD, hiatal hernia Smoking history: Former smoker. Quit 1960. 1 pack year.  Maintenance: None  Patient of Dr. Lamonte Sakai  Chief complaint: Wheezing, congestion, status post Covid   86 year old female former smoker followed in our office for COPD.  She last completed a televisit with our office in December/2020.  Since then she was hospitalized in January/2021 for COVID-19.  Patient was discharged on 03/04/2019.  She did receive IV steroids as well as remdesivir.  She was discharged to a skilled nursing facility.  Patient scheduled with our office today for an acute televisit for cough, congestion, wheezing.  Patient is status post COVID-19 pneumonia.  She was hospitalized in January/2021.  She has been discharged from [facility].  She has been retested for Covid and was found to be negative.  She reports that on 04/06/2019 she had a significant cough all through the night with thick white to green mucus as well as wheezing.  She  reports that she continued to have increased cough and congestion on Sunday.  Patient describes occasional intermittent wheezing when dealing with the symptoms.  Patient is endorsing both discolored nasal drainage as well as discolored respiratory sputum when coughing this up.  Physical therapy evaluated the patient and in her home today.  The vital signs are listed below.  Unfortunately patient's pulse oximeter is broken so she is unable to fully assess her oxygen levels.   Observations/Objective:  04/08/19 - HR - 68 04/08/19 - BP -  102/74 04/08/19 - RR - 17 04/08/19 - SPO2 - ? - pts is broken  04/08/19 - temp - 97.5  02/25/2019-SARS-CoV-2-positive  02/25/2019-CTA chest-no evidence of PE, diffuse bilateral groundglass airspace opacities, small right-sided pleural effusion, enlarged main pulmonary artery which can be seen in pulmonary arterial hypertension  Social History   Tobacco Use  Smoking Status Former Smoker  . Years: 1.00  . Types: Cigarettes  . Quit date: 02/21/1958  . Years since quitting: 61.1  Smokeless Tobacco Never Used   Immunization History  Administered Date(s) Administered  . Fluad Quad(high Dose 65+) 10/17/2018  . Influenza Split 11/21/2008, 02/09/2010, 10/24/2011, 11/21/2012  . Influenza Whole 11/27/2008  . Influenza, High Dose Seasonal PF 11/12/2011, 11/29/2012, 11/05/2013, 01/05/2015, 11/16/2015, 10/21/2016, 11/09/2017  . Influenza,inj,Quad PF,6+ Mos 11/23/2015  . Influenza-Unspecified 02/02/2011  . Pneumococcal Conjugate-13 04/23/2013  . Pneumococcal Polysaccharide-23 02/21/2005  . Tdap 09/22/2007  . Zoster 04/22/2010  . Zoster Recombinat (Shingrix) 07/11/2016, 01/27/2017    Assessment and Plan:  Seasonal and perennial allergic rhinitis Plan: Continue ipratropium nasal spray 3 times daily Continue nasal  saline rinses at least 1-2 times daily prior to ipratropium nasal spray Continue Zyrtec Continue chlor tabs Can use Flonase as needed for nasal  congestion Can continue to use regular Mucinex as needed for chest congestion Follow-up with our office in 4 to 6 weeks   Long term (current) use of anticoagulants Plan:  continue coumadin  Call coumadin clinic and notify that you are starting Augmentin  History of DVT (deep vein thrombosis) Plan: Continue Coumadin  Pneumonia due to COVID-19 virus Hospitalized January/2021 at [facility] for COVID-19/pneumonia Tested positive for Covid on 02/25/2019 Repeat Covid testing 1 month later negative, in chart  Patient endorsing 2 to 3 days of increased congestion, sputum color changing, denies fevers Patient unable to check oxygen levels as pulse oximeter at home was broken Patient evaluated by physical therapy earlier this morning vital signs are stable Patient afebrile per physical therapy's assessment  January/2021 CTA chest does show diffuse bilateral groundglass airspace opacities  Concern would be a bacterial pneumonia status post viral pneumonia due to COVID-19.  Plan: Augmentin today Close follow-up with our office in 4 to 6 weeks with a chest x-ray Patient to contact our office sooner if symptoms or not improving Likely need to consider high-resolution CT chest in 3 to 6 months May need to consider pulmonary function testing     Follow Up Instructions:  Return in about 6 weeks (around 05/20/2019), or if symptoms worsen or fail to improve, for With Chest Xray, Follow up with Dr. Lamonte Sakai, Follow up with Wyn Quaker FNP-C.   I discussed the assessment and treatment plan with the patient. The patient was provided an opportunity to ask questions and all were answered. The patient agreed with the plan and demonstrated an understanding of the instructions.   The patient was advised to call back or seek an in-person evaluation if the symptoms worsen or if the condition fails to improve as anticipated.  I provided 25 minutes of non-face-to-face time during this encounter.   Lauraine Rinne, NP

## 2019-04-08 NOTE — Assessment & Plan Note (Addendum)
Hospitalized January/2021 at [facility] for COVID-19/pneumonia Tested positive for Covid on 02/25/2019 Repeat Covid testing 1 month later negative, in chart  Patient endorsing 2 to 3 days of increased congestion, sputum color changing, denies fevers Patient unable to check oxygen levels as pulse oximeter at home was broken Patient evaluated by physical therapy earlier this morning vital signs are stable Patient afebrile per physical therapy's assessment  January/2021 CTA chest does show diffuse bilateral groundglass airspace opacities  Concern would be a bacterial pneumonia status post viral pneumonia due to COVID-19.  Plan: Augmentin today Close follow-up with our office in 4 to 6 weeks with a chest x-ray Patient to contact our office sooner if symptoms or not improving Likely need to consider high-resolution CT chest in 3 to 6 months May need to consider pulmonary function testing

## 2019-04-08 NOTE — Telephone Encounter (Signed)
Pt stated that they have been taking their coumadin completely opposite of what they were told and that they will start augmentin. They were wondering if they need an appt prior to starting the new med.

## 2019-04-08 NOTE — Assessment & Plan Note (Signed)
Plan: Continue Coumadin 

## 2019-04-08 NOTE — Telephone Encounter (Signed)
Have her added to the schedule for Friday

## 2019-04-09 ENCOUNTER — Other Ambulatory Visit: Payer: Self-pay | Admitting: Emergency Medicine

## 2019-04-09 DIAGNOSIS — J1282 Pneumonia due to coronavirus disease 2019: Secondary | ICD-10-CM | POA: Diagnosis not present

## 2019-04-09 DIAGNOSIS — I5032 Chronic diastolic (congestive) heart failure: Secondary | ICD-10-CM | POA: Diagnosis not present

## 2019-04-09 DIAGNOSIS — J9600 Acute respiratory failure, unspecified whether with hypoxia or hypercapnia: Secondary | ICD-10-CM | POA: Diagnosis not present

## 2019-04-09 DIAGNOSIS — D682 Hereditary deficiency of other clotting factors: Secondary | ICD-10-CM | POA: Diagnosis not present

## 2019-04-09 DIAGNOSIS — U071 COVID-19: Secondary | ICD-10-CM | POA: Diagnosis not present

## 2019-04-09 DIAGNOSIS — I110 Hypertensive heart disease with heart failure: Secondary | ICD-10-CM | POA: Diagnosis not present

## 2019-04-09 NOTE — Telephone Encounter (Signed)
Sharyn Lull from [facility] given Verbal order to continue with OT.

## 2019-04-09 NOTE — Telephone Encounter (Signed)
Sharyn Lull form [facility] is calling to inform Robin Harrell that they have completed evaluation, want to continue with OT for 1 once a week for a week, then go to 2 times a week for 3 weeks.  best contact number [phone]

## 2019-04-10 DIAGNOSIS — D682 Hereditary deficiency of other clotting factors: Secondary | ICD-10-CM | POA: Diagnosis not present

## 2019-04-10 DIAGNOSIS — J9600 Acute respiratory failure, unspecified whether with hypoxia or hypercapnia: Secondary | ICD-10-CM | POA: Diagnosis not present

## 2019-04-10 DIAGNOSIS — J1282 Pneumonia due to coronavirus disease 2019: Secondary | ICD-10-CM | POA: Diagnosis not present

## 2019-04-10 DIAGNOSIS — I110 Hypertensive heart disease with heart failure: Secondary | ICD-10-CM | POA: Diagnosis not present

## 2019-04-10 DIAGNOSIS — U071 COVID-19: Secondary | ICD-10-CM | POA: Diagnosis not present

## 2019-04-10 DIAGNOSIS — I5032 Chronic diastolic (congestive) heart failure: Secondary | ICD-10-CM | POA: Diagnosis not present

## 2019-04-11 DIAGNOSIS — J9600 Acute respiratory failure, unspecified whether with hypoxia or hypercapnia: Secondary | ICD-10-CM | POA: Diagnosis not present

## 2019-04-11 DIAGNOSIS — U071 COVID-19: Secondary | ICD-10-CM | POA: Diagnosis not present

## 2019-04-11 DIAGNOSIS — I5032 Chronic diastolic (congestive) heart failure: Secondary | ICD-10-CM | POA: Diagnosis not present

## 2019-04-11 DIAGNOSIS — I110 Hypertensive heart disease with heart failure: Secondary | ICD-10-CM | POA: Diagnosis not present

## 2019-04-11 DIAGNOSIS — D682 Hereditary deficiency of other clotting factors: Secondary | ICD-10-CM | POA: Diagnosis not present

## 2019-04-11 DIAGNOSIS — J1282 Pneumonia due to coronavirus disease 2019: Secondary | ICD-10-CM | POA: Diagnosis not present

## 2019-04-12 DIAGNOSIS — J9600 Acute respiratory failure, unspecified whether with hypoxia or hypercapnia: Secondary | ICD-10-CM | POA: Diagnosis not present

## 2019-04-12 DIAGNOSIS — I5032 Chronic diastolic (congestive) heart failure: Secondary | ICD-10-CM | POA: Diagnosis not present

## 2019-04-12 DIAGNOSIS — U071 COVID-19: Secondary | ICD-10-CM | POA: Diagnosis not present

## 2019-04-12 DIAGNOSIS — J1282 Pneumonia due to coronavirus disease 2019: Secondary | ICD-10-CM | POA: Diagnosis not present

## 2019-04-12 DIAGNOSIS — I110 Hypertensive heart disease with heart failure: Secondary | ICD-10-CM | POA: Diagnosis not present

## 2019-04-12 DIAGNOSIS — D682 Hereditary deficiency of other clotting factors: Secondary | ICD-10-CM | POA: Diagnosis not present

## 2019-04-15 DIAGNOSIS — J9600 Acute respiratory failure, unspecified whether with hypoxia or hypercapnia: Secondary | ICD-10-CM | POA: Diagnosis not present

## 2019-04-15 DIAGNOSIS — I5032 Chronic diastolic (congestive) heart failure: Secondary | ICD-10-CM | POA: Diagnosis not present

## 2019-04-15 DIAGNOSIS — U071 COVID-19: Secondary | ICD-10-CM | POA: Diagnosis not present

## 2019-04-15 DIAGNOSIS — J1282 Pneumonia due to coronavirus disease 2019: Secondary | ICD-10-CM | POA: Diagnosis not present

## 2019-04-15 DIAGNOSIS — I110 Hypertensive heart disease with heart failure: Secondary | ICD-10-CM | POA: Diagnosis not present

## 2019-04-15 DIAGNOSIS — D682 Hereditary deficiency of other clotting factors: Secondary | ICD-10-CM | POA: Diagnosis not present

## 2019-04-16 ENCOUNTER — Ambulatory Visit (INDEPENDENT_AMBULATORY_CARE_PROVIDER_SITE_OTHER): Payer: Medicare Other | Admitting: Cardiovascular Disease

## 2019-04-16 ENCOUNTER — Other Ambulatory Visit: Payer: Self-pay

## 2019-04-16 ENCOUNTER — Ambulatory Visit (INDEPENDENT_AMBULATORY_CARE_PROVIDER_SITE_OTHER): Payer: Medicare Other | Admitting: Pharmacist Clinician (PhC)/ Clinical Pharmacy Specialist

## 2019-04-16 ENCOUNTER — Encounter: Payer: Self-pay | Admitting: Family Medicine

## 2019-04-16 ENCOUNTER — Ambulatory Visit (INDEPENDENT_AMBULATORY_CARE_PROVIDER_SITE_OTHER): Payer: Medicare Other | Admitting: Family Medicine

## 2019-04-16 ENCOUNTER — Encounter: Payer: Self-pay | Admitting: Cardiovascular Disease

## 2019-04-16 VITALS — BP 102/54 | HR 94 | Temp 98.4°F | Ht 60.0 in | Wt 127.0 lb

## 2019-04-16 VITALS — BP 136/68 | HR 44 | Temp 97.4°F | Ht 62.0 in | Wt 129.2 lb

## 2019-04-16 DIAGNOSIS — Z7901 Long term (current) use of anticoagulants: Secondary | ICD-10-CM

## 2019-04-16 DIAGNOSIS — R059 Cough, unspecified: Secondary | ICD-10-CM

## 2019-04-16 DIAGNOSIS — U071 COVID-19: Secondary | ICD-10-CM | POA: Diagnosis not present

## 2019-04-16 DIAGNOSIS — Z09 Encounter for follow-up examination after completed treatment for conditions other than malignant neoplasm: Secondary | ICD-10-CM

## 2019-04-16 DIAGNOSIS — I350 Nonrheumatic aortic (valve) stenosis: Secondary | ICD-10-CM

## 2019-04-16 DIAGNOSIS — E782 Mixed hyperlipidemia: Secondary | ICD-10-CM

## 2019-04-16 DIAGNOSIS — J1282 Pneumonia due to coronavirus disease 2019: Secondary | ICD-10-CM

## 2019-04-16 DIAGNOSIS — M810 Age-related osteoporosis without current pathological fracture: Secondary | ICD-10-CM | POA: Diagnosis not present

## 2019-04-16 DIAGNOSIS — I10 Essential (primary) hypertension: Secondary | ICD-10-CM | POA: Diagnosis not present

## 2019-04-16 DIAGNOSIS — I5189 Other ill-defined heart diseases: Secondary | ICD-10-CM

## 2019-04-16 DIAGNOSIS — J9600 Acute respiratory failure, unspecified whether with hypoxia or hypercapnia: Secondary | ICD-10-CM | POA: Diagnosis not present

## 2019-04-16 DIAGNOSIS — R600 Localized edema: Secondary | ICD-10-CM | POA: Diagnosis not present

## 2019-04-16 DIAGNOSIS — D682 Hereditary deficiency of other clotting factors: Secondary | ICD-10-CM | POA: Diagnosis not present

## 2019-04-16 DIAGNOSIS — R05 Cough: Secondary | ICD-10-CM

## 2019-04-16 DIAGNOSIS — I110 Hypertensive heart disease with heart failure: Secondary | ICD-10-CM | POA: Diagnosis not present

## 2019-04-16 DIAGNOSIS — I5032 Chronic diastolic (congestive) heart failure: Secondary | ICD-10-CM | POA: Diagnosis not present

## 2019-04-16 LAB — POCT INR: INR: 4.2 — AB (ref 2.0–3.0)

## 2019-04-16 MED ORDER — FUROSEMIDE 20 MG PO TABS: 20.0000 mg | ORAL_TABLET | Freq: Every day | ORAL | 0 refills | Status: DC | PRN

## 2019-04-16 MED ORDER — DENOSUMAB 60 MG/ML ~~LOC~~ SOSY
60.0000 mg | PREFILLED_SYRINGE | Freq: Once | SUBCUTANEOUS | Status: AC
  Administered 2019-04-16: 60 mg via SUBCUTANEOUS

## 2019-04-16 MED ORDER — TRIAMCINOLONE ACETONIDE 0.1 % EX CREA: 1.0000 "application " | TOPICAL_CREAM | Freq: Two times a day (BID) | CUTANEOUS | 0 refills | Status: DC

## 2019-04-16 NOTE — Assessment & Plan Note (Signed)
History of long-term use of Coumadin because of factor V Leiden deficiency and prior pulmonary emboli.  We will check her PT/INR today.

## 2019-04-16 NOTE — Patient Instructions (Signed)
Please return in 4-6 weeks for recheck.  You may cancel your appointment with me in March.  Use the steroid cream twice a day for the next 1 week, then as needed. Elevate your legs.  Use lasix daily for 3 days, then as needed for leg swelling.  Eat a high protein diet.   If you have any questions or concerns, please don't hesitate to send me a message via MyChart or call the office at [phone]. Thank you for visiting with [place] today! It's our pleasure caring for you.   High-Protein and High-Calorie Diet Eating high-protein and high-calorie foods can help you to gain weight, heal after an injury, and recover after an illness or surgery. The specific amount of daily protein and calories you need depends on:  Your body weight.  The reason this diet is recommended for you. What is my plan? Generally, a high-protein, high-calorie diet involves:  Eating 250-500 extra calories each day.  Making sure that you get enough of your daily calories from protein. Ask your health care provider how many of your calories should come from protein. Talk with a health care provider, such as a diet and nutrition specialist (dietitian), about how much protein and how many calories you need each day. Follow the diet as directed by your health care provider. What are tips for following this plan?  Preparing meals  Add whole milk, half-and-half, or heavy cream to cereal, pudding, soup, or hot cocoa.  Add whole milk to instant breakfast drinks.  Add peanut butter to oatmeal or smoothies.  Add powdered milk to baked goods, smoothies, or milkshakes.  Add powdered milk, cream, or butter to mashed potatoes.  Add cheese to cooked vegetables.  Make whole-milk yogurt parfaits. Top them with granola, fruit, or nuts.  Add cottage cheese to your fruit.  Add avocado, cheese, or both to sandwiches or salads.  Add meat, poultry, or seafood to rice, pasta, casseroles, salads, and soups.  Use mayonnaise when  making egg salad, chicken salad, or tuna salad.  Use peanut butter as a dip for vegetables or as a topping for pretzels, celery, or crackers.  Add beans to casseroles, dips, and spreads.  Add pureed beans to sauces and soups.  Replace calorie-free drinks with calorie-containing drinks, such as milk and fruit juice.  Replace water with milk or heavy cream when making foods such as oatmeal, pudding, or cocoa. General instructions  Ask your health care provider if you should take a nutritional supplement.  Try to eat six small meals each day instead of three large meals.  Eat a balanced diet. In each meal, include one food that is high in protein.  Keep nutritious snacks available, such as nuts, trail mixes, dried fruit, and yogurt.  If you have kidney disease or diabetes, talk with your health care provider about how much protein is safe for you. Too much protein may put extra stress on your kidneys.  Drink your calories. Choose high-calorie drinks and have them after your meals. What high-protein foods should I eat?  Vegetables Soybeans. Peas. Grains Quinoa. Bulgur wheat. Meats and other proteins Beef, pork, and poultry. Fish and seafood. Eggs. Tofu. Textured vegetable protein (TVP). Peanut butter. Nuts and seeds. Dried beans. Protein powders. Dairy Whole milk. Whole-milk yogurt. Powdered milk. Cheese. [employer]. Eggnog. Beverages High-protein supplement drinks. Soy milk. Other foods Protein bars. The items listed above may not be a complete list of high-protein foods and beverages. Contact a dietitian for more options. What high-calorie  foods should I eat? Fruits Dried fruit. Fruit leather. Canned fruit in syrup. Fruit juice. Avocado. Vegetables Vegetables cooked in oil or butter. Fried potatoes. Grains Pasta. Quick breads. Muffins. Pancakes. Ready-to-eat cereal. Meats and other proteins Peanut butter. Nuts and seeds. Dairy Heavy cream. Whipped cream. Cream  cheese. Sour cream. Ice cream. Custard. Pudding. Beverages Meal-replacement beverages. Nutrition shakes. Fruit juice. Sugar-sweetened soft drinks. Seasonings and condiments Salad dressing. Mayonnaise. Alfredo sauce. Fruit preserves or jelly. Honey. Syrup. Sweets and desserts Cake. Cookies. Pie. Pastries. Candy bars. Chocolate. Fats and oils Butter or margarine. Oil. Gravy. Other foods Meal-replacement bars. The items listed above may not be a complete list of high-calorie foods and beverages. Contact a dietitian for more options. Summary  A high-protein, high-calorie diet can help you gain weight or heal faster after an injury, illness, or surgery.  To increase your protein and calories, add ingredients such as whole milk, peanut butter, cheese, beans, meat, or seafood to meal items.  To get enough extra calories each day, include high-calorie foods and beverages at each meal.  Adding a high-calorie drink or shake can be an easy way to help you get enough calories each day. Talk with your healthcare provider or dietitian about the best options for you. This information is not intended to replace advice given to you by your health care provider. Make sure you discuss any questions you have with your health care provider. Document Revised: 01/20/2017 Document Reviewed: 12/20/2016 Elsevier Patient Education  2020 [employer].

## 2019-04-16 NOTE — Assessment & Plan Note (Signed)
History of hyperlipidemia on statin therapy with lipid profile performed 01/29/2019 revealing total cholesterol 168, LDL 77 and HDL of 80.

## 2019-04-16 NOTE — Assessment & Plan Note (Signed)
History of mild aortic stenosis by 2D echo performed 02/07/2017 with a valve area of 1.17 cm and a peak gradient of 25 mmHg.  She does have is soft outflow tract murmur.  We will recheck a 2D echocardiogram.

## 2019-04-16 NOTE — Progress Notes (Signed)
04/16/2019 Robin Harrell [facility]   1933/03/06  [id number]  Primary Physician [name], MD Primary Cardiologist: Lorretta Harp MD FACP, [place], [place], [place]  HPI:  Robin Harrell is a 86 y.o.  thin-appearing, married Caucasian female, mother of 85, grandmother to 4 grandchildren who I last saw in the office on  06/30/2017.  She is accompanied by one of her daughters [name] today.. She has a history of treated hypertension, hyperlipidemia, and GERD. She does have a history of pulmonary embolism in the past with factor V Leiden deficiency on life-long Coumadin anticoagulation. She has had a normal 2D echo and Myoview back in 2009. She was recently admitted to [facility] for 2 weeks with acute respiratory insufficiency requiring intubation related to pneumonia and RSV. There was a thought that she may have had congestive heart failure as well, though a 2D echo was normal. She did have a Holter monitor that showed a large number of bigeminal PVCs which she is asymptomatic from and currently is on low-dose beta blocker.   She was hospitalized at [facility] 02/25/2019 for approximately a week with viral pneumonitis secondary to COVID-19 pneumonia.  Her husband was COVID-19 positive as well but he was relatively asymptomatic.  She is slowly recovering from this.  She denies chest pain or shortness of breath.  Current Meds  Medication Sig  . acetaminophen (TYLENOL) 500 MG tablet Take 1,000 mg by mouth every 6 (six) hours as needed for moderate pain.  Marland Kitchen atorvastatin (LIPITOR) 10 MG tablet TAKE 1 TABLET (10 MG TOTAL) BY MOUTH DAILY AT 6 PM. (Patient taking differently: Take 10 mg by mouth at bedtime. )  . brimonidine (ALPHAGAN) 0.15 % ophthalmic solution Place 2 drops into both eyes 2 (two) times daily.   . Calcium-Phosphorus-Vitamin D (CALCIUM/D3 ADULT GUMMIES) 200-96.6-200 MG-MG-UNIT CHEW Chew 2 tablets by mouth daily.  . cetirizine (ZYRTEC) 10 MG tablet TAKE 1 TABLET BY MOUTH  EVERY DAY  . chlorpheniramine (CHLOR-TRIMETON) 4 MG tablet Take 4 mg by mouth every morning.   . fluticasone (FLONASE) 50 MCG/ACT nasal spray SPRAY 2 SPRAYS INTO EACH NOSTRIL EVERY DAY  . furosemide (LASIX) 20 MG tablet Take 1 tablet (20 mg total) by mouth daily as needed for edema.  Marland Kitchen ipratropium (ATROVENT) 0.03 % nasal spray PLACE 2 SPRAYS INTO BOTH NOSTRILS 3 (THREE) TIMES DAILY AS NEEDED FOR RHINITIS.  Marland Kitchen latanoprost (XALATAN) 0.005 % ophthalmic solution Place 1 drop into both eyes at bedtime.   Marland Kitchen levothyroxine (SYNTHROID, LEVOTHROID) 25 MCG tablet TAKE 1 TABLET BY MOUTH EVERY DAY (Patient taking differently: Take 25 mcg by mouth daily before breakfast. )  . losartan (COZAAR) 50 MG tablet Take 1 tablet (50 mg total) by mouth daily.  . metoprolol tartrate (LOPRESSOR) 25 MG tablet TAKE 1 TABLET (25 MG TOTAL) BY MOUTH 2 (TWO) TIMES DAILY. *NEEDS OFFICE VISIT FOR FURTHER REFILLS*  . multivitamin-iron-minerals-folic acid (CENTRUM) chewable tablet Chew 1 tablet by mouth daily.  Marland Kitchen triamcinolone cream (KENALOG) 0.1 % Apply 1 application topically 2 (two) times daily. For 2 weeks, then as needed  . vitamin E (VITAMIN E) 1000 UNIT capsule Take 1,000 Units by mouth daily.  Marland Kitchen warfarin (COUMADIN) 7.5 MG tablet TAKE 1/2 TO 1 TABLET BY MOUTH DAILY AS DIRECTED     Allergies  Allergen Reactions  . Azithromycin Nausea Only  . Codeine Nausea Only  . Erythromycin Nausea And Vomiting    Social History   Socioeconomic History  . Marital  status: Married    Spouse name: Not on file  . Number of children: 4  . Years of education: 13.5  . Highest education level: Not on file  Occupational History  . Occupation: Network engineer - retired  Tobacco Use  . Smoking status: Former Smoker    Years: 1.00    Types: Cigarettes    Quit date: 02/21/1958    Years since quitting: 61.1  . Smokeless tobacco: Never Used  Substance and Sexual Activity  . Alcohol use: Yes    Comment: rareley one glass of wine  . Drug use:  No  . Sexual activity: Never  Other Topics Concern  . Not on file  Social History Narrative  . Not on file   Social Determinants of Health   Financial Resource Strain:   . Difficulty of Paying Living Expenses: Not on file  Food Insecurity:   . Worried About Charity fundraiser in the Last Year: Not on file  . Ran Out of Food in the Last Year: Not on file  Transportation Needs:   . Lack of Transportation (Medical): Not on file  . Lack of Transportation (Non-Medical): Not on file  Physical Activity:   . Days of Exercise per Week: Not on file  . Minutes of Exercise per Session: Not on file  Stress:   . Feeling of Stress : Not on file  Social Connections:   . Frequency of Communication with Friends and Family: Not on file  . Frequency of Social Gatherings with Friends and Family: Not on file  . Attends Religious Services: Not on file  . Active Member of Clubs or Organizations: Not on file  . Attends Archivist Meetings: Not on file  . Marital Status: Not on file  Intimate Partner Violence:   . Fear of Current or Ex-Partner: Not on file  . Emotionally Abused: Not on file  . Physically Abused: Not on file  . Sexually Abused: Not on file     Review of Systems: General: negative for chills, fever, night sweats or weight changes.  Cardiovascular: negative for chest pain, dyspnea on exertion, edema, orthopnea, palpitations, paroxysmal nocturnal dyspnea or shortness of breath Dermatological: negative for rash Respiratory: negative for cough or wheezing Urologic: negative for hematuria Abdominal: negative for nausea, vomiting, diarrhea, bright red blood per rectum, melena, or hematemesis Neurologic: negative for visual changes, syncope, or dizziness All other systems reviewed and are otherwise negative except as noted above.    Blood pressure (!) 102/54, pulse 94, temperature 98.4 F (36.9 C), height 5' (1.524 m), weight 127 lb (57.6 kg).  General appearance: alert and  no distress Neck: no adenopathy, no carotid bruit, no JVD, supple, symmetrical, trachea midline and thyroid not enlarged, symmetric, no tenderness/mass/nodules Lungs: clear to auscultation bilaterally Heart: 2/6 outflow tract murmur consistent with aortic stenosis Extremities: extremities normal, atraumatic, no cyanosis or edema Pulses: 2+ and symmetric Skin: Skin color, texture, turgor normal. No rashes or lesions Neurologic: Alert and oriented X 3, normal strength and tone. Normal symmetric reflexes. Normal coordination and gait  EKG not performed today  ASSESSMENT AND PLAN:   Essential hypertension History of essential hypertension blood pressure measured today at 102/54.  She is on losartan and metoprolol.  Long term (current) use of anticoagulants History of long-term use of Coumadin because of factor V Leiden deficiency and prior pulmonary emboli.  We will check her PT/INR today.  Hyperlipidemia History of hyperlipidemia on statin therapy with lipid profile performed 01/29/2019 revealing total  cholesterol 168, LDL 77 and HDL of 80.  Diastolic dysfunction History of grade 3 diastolic dysfunction by 2D echocardiogram performed 02/07/2017 on oral diuretics.  Aortic stenosis, mild History of mild aortic stenosis by 2D echo performed 02/07/2017 with a valve area of 1.17 cm and a peak gradient of 25 mmHg.  She does have is soft outflow tract murmur.  We will recheck a 2D echocardiogram.      Lorretta Harp MD [facility], [facility] 04/16/2019 4:33 PM

## 2019-04-16 NOTE — Assessment & Plan Note (Signed)
History of grade 3 diastolic dysfunction by 2D echocardiogram performed 02/07/2017 on oral diuretics.

## 2019-04-16 NOTE — Patient Instructions (Signed)
Decrease dose to 1/2 tablet daily except for 1 tablet each Monday,  and Friday.  Repeat INR in 2 weeks

## 2019-04-16 NOTE — Assessment & Plan Note (Signed)
History of essential hypertension blood pressure measured today at 102/54.  She is on losartan and metoprolol.

## 2019-04-16 NOTE — Patient Instructions (Signed)
Medication Instructions:  Your physician recommends that you continue on your current medications as directed. Please refer to the Current Medication list given to you today.  If you need a refill on your cardiac medications before your next appointment, please call your pharmacy.   Lab work: NONE  Testing/Procedures: Your physician has requested that you have an echocardiogram. Echocardiography is a painless test that uses sound waves to create images of your heart. It provides your doctor with information about the size and shape of your heart and how well your heart's chambers and valves are working. This procedure takes approximately one hour. There are no restrictions for this procedure. [address]  Follow-Up: At [facility], you and your health needs are our priority.  As part of our continuing mission to provide you with exceptional heart care, we have created designated Provider Care Teams.  These Care Teams include your primary Cardiologist (physician) and Advanced Practice Providers (APPs -  Physician Assistants and Nurse Practitioners) who all work together to provide you with the care you need, when you need it. You may see Dr. Berry or one of the following Advanced Practice Providers on your designated Care Team:    Luke Kilroy, [name]  Callie Goodrich, [name]  Jesse Cleaver, FNP  Your physician wants you to follow-up as needed   

## 2019-04-16 NOTE — Progress Notes (Signed)
Subjective  CC:  Chief Complaint  Patient presents with  . Hospitalization Follow-up    Following up after Covid19. Feeling tired a lot  . Prolia injection    HPI: Robin Harrell is a 86 y.o. female who presents to the office today to address the problems listed above in the chief complaint. 86 yo recovering from covid pneumonia. I've personally reviewed recent office visit notes, hospital notes, associated labs and imaging reports and/or pertinent outside office records via chart review or CareEverywhere. Briefly, hopitalization 1/4-1/11 due to covid pneumonia and weakness; transitioned to [facility] and released about 2-3 weeks ago. chestCT was remarkable for ground glass changes in bilateral lungs associated with hypoxia resolved now. Had recent visit with pulm for productive cough, ? covid related or new COPD exacerbation treated with augmentin and now improved. She continue to have physical therapy for strengthening. Labs in hospital were significant for mild persistent anemia, hypoalbuminemia.  She reports she is doing better. Still fatigued but getting stronger. Her respiratory status has stabilized and her cough has improved. She has f/u with pulm in 6 weeks for repeat cxr and maybe chest CT/PFTs.  Appetite has been down and she is working on taking in better nutrition.  LE edema persists with some redness, no pain. New problems.  Due for prolia injection today   has questions about when she can get covid vaccination.   Assessment  1. Hospital discharge follow-up   2. Pneumonia due to COVID-19 virus   3. COUGH, CHRONIC   4. Essential hypertension   5. Age-related osteoporosis without current pathological fracture   6. Bilateral lower extremity edema      Plan   Hospital F/u covid pneumonia:  Recovering well. Discussed high protein diet for now to help with malnourished and low albumin levels. Continue PT. Respiratory status is stable. rec covid vaccination in next 2-4 weeks.  (she did not receive the antibody infusion). F/u with pulm as scheduled.   Leg edema: elevated legs and start steroid cream and lasix prn. Will recheck blood work at next visit  HTN is stable. afib on anticoagulation stable  Osteoporosis on prolia;injection today   Follow up: Return in about 6 weeks (around 05/28/2019) for follow up on.  04/29/2019  No orders of the defined types were placed in this encounter.  Meds ordered this encounter  Medications  . triamcinolone cream (KENALOG) 0.1 %    Sig: Apply 1 application topically 2 (two) times daily. For 2 weeks, then as needed    Dispense:  45 g    Refill:  0  . furosemide (LASIX) 20 MG tablet    Sig: Take 1 tablet (20 mg total) by mouth daily as needed for edema.    Dispense:  30 tablet    Refill:  0  . denosumab (PROLIA) injection 60 mg      I reviewed the patients updated PMH, FH, and SocHx.    Patient Active Problem List   Diagnosis Date Noted  . Hypothyroidism (acquired) 09/02/2014    Priority: High  . Hyperlipidemia 11/19/2012    Priority: High  . Long term (current) use of anticoagulants 05/10/2012    Priority: High  . Diastolic dysfunction [id number]    Priority: High  . Factor V deficiency ([place]) 02/09/2010    Priority: High  . Osteoporosis 02/09/2010    Priority: High  . Essential hypertension 11/15/2007    Priority: High  . Chronic venous stasis dermatitis of both lower extremities 12/31/2018  Priority: Medium  . Aortic stenosis, mild 02/07/2017    Priority: Medium  . Chronic idiopathic thrombocytopenia (HCC) 04/14/2016    Priority: Medium  . Essential tremor 09/03/2015    Priority: Medium  . Edema of right lower extremity 02/01/2013    Priority: Medium  . Colon polyp 10/15/2012    Priority: Medium  . Insomnia 10/11/2011    Priority: Medium  . Osteoarthritis, multiple sites 06/30/2011    Priority: Medium  . DJD (degenerative joint disease), lumbar 10/14/2009    Priority: Medium  . Glaucoma  01/29/2008    Priority: Medium  . History of DVT (deep vein thrombosis) 11/15/2007    Priority: Medium  . GERD 11/15/2007    Priority: Medium  . COUGH, CHRONIC 11/15/2007    Priority: Medium  . 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 History of recurrent UTIs 01/26/2017    Priority: Low  . History of respiratory failure 11/11/2014    Priority: Low  . Seasonal and perennial allergic rhinitis 11/15/2007    Priority: Low  . Acute respiratory disease due to COVID-19 virus 02/26/2019  . Pneumonia due to COVID-19 virus 02/26/2019  . Acute respiratory failure due to COVID-19 ([place]) 02/25/2019   Current Meds  Medication Sig  . acetaminophen (TYLENOL) 500 MG tablet Take 1,000 mg by mouth every 6 (six) hours as needed for moderate pain.  Marland Kitchen atorvastatin (LIPITOR) 10 MG tablet TAKE 1 TABLET (10 MG TOTAL) BY MOUTH DAILY AT 6 PM. (Patient taking differently: Take 10 mg by mouth at bedtime. )  . brimonidine (ALPHAGAN) 0.15 % ophthalmic solution Place 2 drops into both eyes 2 (two) times daily.   . Calcium-Phosphorus-Vitamin D (CALCIUM/D3 ADULT GUMMIES) 200-96.6-200 MG-MG-UNIT CHEW Chew 2 tablets by mouth daily.  . cetirizine (ZYRTEC) 10 MG tablet TAKE 1 TABLET BY MOUTH EVERY DAY  . chlorpheniramine (CHLOR-TRIMETON) 4 MG tablet Take 4 mg by mouth every morning.   . fluticasone (FLONASE) 50 MCG/ACT nasal spray SPRAY 2 SPRAYS INTO EACH NOSTRIL EVERY DAY  . ipratropium (ATROVENT) 0.03 % nasal spray PLACE 2 SPRAYS INTO BOTH NOSTRILS 3 (THREE) TIMES DAILY AS NEEDED FOR RHINITIS.  Marland Kitchen latanoprost (XALATAN) 0.005 % ophthalmic solution Place 1 drop into both eyes at bedtime.   Marland Kitchen levothyroxine (SYNTHROID, LEVOTHROID) 25 MCG tablet TAKE 1 TABLET BY MOUTH EVERY DAY (Patient taking differently: Take 25 mcg by mouth daily before breakfast. )  . losartan (COZAAR) 50 MG tablet Take 1 tablet (50 mg total) by mouth daily.  . metoprolol tartrate (LOPRESSOR) 25 MG tablet TAKE 1 TABLET (25 MG TOTAL) BY  MOUTH 2 (TWO) TIMES DAILY. *NEEDS OFFICE VISIT FOR FURTHER REFILLS*  . multivitamin-iron-minerals-folic acid (CENTRUM) chewable tablet Chew 1 tablet by mouth daily.  . vitamin E (VITAMIN E) 1000 UNIT capsule Take 1,000 Units by mouth daily.  Marland Kitchen warfarin (COUMADIN) 7.5 MG tablet TAKE 1/2 TO 1 TABLET BY MOUTH DAILY AS DIRECTED    Allergies: Patient is allergic to azithromycin; codeine; and erythromycin. Family History: Patient family history includes Allergic rhinitis in her sister; Breast cancer (age of onset: 56) in her daughter; Cirrhosis in her father; Coronary artery disease in her maternal grandfather and paternal grandfather; Heart disease in her paternal grandmother; Lung cancer in her father; Skin cancer in her brother; Throat cancer in her maternal grandfather and paternal grandfather. Social History:  Patient  reports that she quit smoking about 61 years ago. Her smoking use included cigarettes. She quit after 1.00 year of use.  She has never used smokeless tobacco. She reports current alcohol use. She reports that she does not use drugs.  Review of Systems: Constitutional: Negative for fever malaise or anorexia Cardiovascular: negative for chest pain Respiratory: negative for SOB or persistent cough Gastrointestinal: negative for abdominal pain  Objective  Vitals: BP 136/68 (BP Location: Left Arm, Patient Position: Sitting, Cuff Size: Normal)   Pulse (!) 44   Temp (!) 97.4 F (36.3 C) (Temporal)   Ht 5\' 2"  (1.575 m)   Wt 129 lb 3.2 oz (58.6 kg)   LMP  (LMP Unknown)   SpO2 97%   BMI 23.63 kg/m  General: no acute distress , A&Ox3, looks good.  HEENT: PEERL, conjunctiva normal, Cardiovascular:  irreg irreg Respiratory:  Good breath sounds bilaterally, CTAB with normal respiratory effort Skin:  Warm, no rashes Ext: +2 edema bilaterally with erythema; no rash. No weeping     Commons side effects, risks, benefits, and alternatives for medications and treatment plan  prescribed today were discussed, and the patient expressed understanding of the given instructions. Patient is instructed to call or message via MyChart if he/she has any questions or concerns regarding our treatment plan. No barriers to understanding were identified. We discussed Red Flag symptoms and signs in detail. Patient expressed understanding regarding what to do in case of urgent or emergency type symptoms.   Medication list was reconciled, printed and provided to the patient in AVS. Patient instructions and summary information was reviewed with the patient as documented in the AVS. This note was prepared with assistance of Dragon voice recognition software. Occasional wrong-word or sound-a-like substitutions may have occurred due to the inherent limitations of voice recognition software  This visit occurred during the SARS-CoV-2 public health emergency.  Safety protocols were in place, including screening questions prior to the visit, additional usage of staff PPE, and extensive cleaning of exam room while observing appropriate contact time as indicated for disinfecting solutions.

## 2019-04-17 ENCOUNTER — Other Ambulatory Visit: Payer: Self-pay | Admitting: Family Medicine

## 2019-04-17 DIAGNOSIS — D682 Hereditary deficiency of other clotting factors: Secondary | ICD-10-CM | POA: Diagnosis not present

## 2019-04-17 DIAGNOSIS — U071 COVID-19: Secondary | ICD-10-CM | POA: Diagnosis not present

## 2019-04-17 DIAGNOSIS — J1282 Pneumonia due to coronavirus disease 2019: Secondary | ICD-10-CM | POA: Diagnosis not present

## 2019-04-17 DIAGNOSIS — I110 Hypertensive heart disease with heart failure: Secondary | ICD-10-CM | POA: Diagnosis not present

## 2019-04-17 DIAGNOSIS — I5032 Chronic diastolic (congestive) heart failure: Secondary | ICD-10-CM | POA: Diagnosis not present

## 2019-04-17 DIAGNOSIS — J9600 Acute respiratory failure, unspecified whether with hypoxia or hypercapnia: Secondary | ICD-10-CM | POA: Diagnosis not present

## 2019-04-18 DIAGNOSIS — J9600 Acute respiratory failure, unspecified whether with hypoxia or hypercapnia: Secondary | ICD-10-CM | POA: Diagnosis not present

## 2019-04-18 DIAGNOSIS — I110 Hypertensive heart disease with heart failure: Secondary | ICD-10-CM | POA: Diagnosis not present

## 2019-04-18 DIAGNOSIS — I5032 Chronic diastolic (congestive) heart failure: Secondary | ICD-10-CM | POA: Diagnosis not present

## 2019-04-18 DIAGNOSIS — D682 Hereditary deficiency of other clotting factors: Secondary | ICD-10-CM | POA: Diagnosis not present

## 2019-04-18 DIAGNOSIS — U071 COVID-19: Secondary | ICD-10-CM | POA: Diagnosis not present

## 2019-04-18 DIAGNOSIS — J1282 Pneumonia due to coronavirus disease 2019: Secondary | ICD-10-CM | POA: Diagnosis not present

## 2019-04-19 DIAGNOSIS — U071 COVID-19: Secondary | ICD-10-CM | POA: Diagnosis not present

## 2019-04-19 DIAGNOSIS — D682 Hereditary deficiency of other clotting factors: Secondary | ICD-10-CM | POA: Diagnosis not present

## 2019-04-19 DIAGNOSIS — J1282 Pneumonia due to coronavirus disease 2019: Secondary | ICD-10-CM | POA: Diagnosis not present

## 2019-04-19 DIAGNOSIS — J9600 Acute respiratory failure, unspecified whether with hypoxia or hypercapnia: Secondary | ICD-10-CM | POA: Diagnosis not present

## 2019-04-19 DIAGNOSIS — I110 Hypertensive heart disease with heart failure: Secondary | ICD-10-CM | POA: Diagnosis not present

## 2019-04-19 DIAGNOSIS — I5032 Chronic diastolic (congestive) heart failure: Secondary | ICD-10-CM | POA: Diagnosis not present

## 2019-04-22 ENCOUNTER — Other Ambulatory Visit: Payer: Self-pay | Admitting: Cardiovascular Disease

## 2019-04-23 DIAGNOSIS — U071 COVID-19: Secondary | ICD-10-CM | POA: Diagnosis not present

## 2019-04-23 DIAGNOSIS — D682 Hereditary deficiency of other clotting factors: Secondary | ICD-10-CM | POA: Diagnosis not present

## 2019-04-23 DIAGNOSIS — J1282 Pneumonia due to coronavirus disease 2019: Secondary | ICD-10-CM | POA: Diagnosis not present

## 2019-04-23 DIAGNOSIS — I110 Hypertensive heart disease with heart failure: Secondary | ICD-10-CM | POA: Diagnosis not present

## 2019-04-23 DIAGNOSIS — I5032 Chronic diastolic (congestive) heart failure: Secondary | ICD-10-CM | POA: Diagnosis not present

## 2019-04-23 DIAGNOSIS — J9600 Acute respiratory failure, unspecified whether with hypoxia or hypercapnia: Secondary | ICD-10-CM | POA: Diagnosis not present

## 2019-04-24 DIAGNOSIS — J1282 Pneumonia due to coronavirus disease 2019: Secondary | ICD-10-CM | POA: Diagnosis not present

## 2019-04-24 DIAGNOSIS — I5032 Chronic diastolic (congestive) heart failure: Secondary | ICD-10-CM | POA: Diagnosis not present

## 2019-04-24 DIAGNOSIS — D682 Hereditary deficiency of other clotting factors: Secondary | ICD-10-CM | POA: Diagnosis not present

## 2019-04-24 DIAGNOSIS — U071 COVID-19: Secondary | ICD-10-CM | POA: Diagnosis not present

## 2019-04-24 DIAGNOSIS — J9600 Acute respiratory failure, unspecified whether with hypoxia or hypercapnia: Secondary | ICD-10-CM | POA: Diagnosis not present

## 2019-04-24 DIAGNOSIS — I110 Hypertensive heart disease with heart failure: Secondary | ICD-10-CM | POA: Diagnosis not present

## 2019-04-25 DIAGNOSIS — J9600 Acute respiratory failure, unspecified whether with hypoxia or hypercapnia: Secondary | ICD-10-CM | POA: Diagnosis not present

## 2019-04-25 DIAGNOSIS — I5032 Chronic diastolic (congestive) heart failure: Secondary | ICD-10-CM | POA: Diagnosis not present

## 2019-04-25 DIAGNOSIS — U071 COVID-19: Secondary | ICD-10-CM | POA: Diagnosis not present

## 2019-04-25 DIAGNOSIS — J1282 Pneumonia due to coronavirus disease 2019: Secondary | ICD-10-CM | POA: Diagnosis not present

## 2019-04-25 DIAGNOSIS — I110 Hypertensive heart disease with heart failure: Secondary | ICD-10-CM | POA: Diagnosis not present

## 2019-04-25 DIAGNOSIS — D682 Hereditary deficiency of other clotting factors: Secondary | ICD-10-CM | POA: Diagnosis not present

## 2019-04-26 DIAGNOSIS — J1282 Pneumonia due to coronavirus disease 2019: Secondary | ICD-10-CM | POA: Diagnosis not present

## 2019-04-26 DIAGNOSIS — U071 COVID-19: Secondary | ICD-10-CM | POA: Diagnosis not present

## 2019-04-26 DIAGNOSIS — I110 Hypertensive heart disease with heart failure: Secondary | ICD-10-CM | POA: Diagnosis not present

## 2019-04-26 DIAGNOSIS — D682 Hereditary deficiency of other clotting factors: Secondary | ICD-10-CM | POA: Diagnosis not present

## 2019-04-26 DIAGNOSIS — J9600 Acute respiratory failure, unspecified whether with hypoxia or hypercapnia: Secondary | ICD-10-CM | POA: Diagnosis not present

## 2019-04-26 DIAGNOSIS — I5032 Chronic diastolic (congestive) heart failure: Secondary | ICD-10-CM | POA: Diagnosis not present

## 2019-04-28 DIAGNOSIS — I110 Hypertensive heart disease with heart failure: Secondary | ICD-10-CM | POA: Diagnosis not present

## 2019-04-28 DIAGNOSIS — M4010 Other secondary kyphosis, site unspecified: Secondary | ICD-10-CM | POA: Diagnosis not present

## 2019-04-28 DIAGNOSIS — I5032 Chronic diastolic (congestive) heart failure: Secondary | ICD-10-CM | POA: Diagnosis not present

## 2019-04-28 DIAGNOSIS — Z7951 Long term (current) use of inhaled steroids: Secondary | ICD-10-CM | POA: Diagnosis not present

## 2019-04-28 DIAGNOSIS — J9600 Acute respiratory failure, unspecified whether with hypoxia or hypercapnia: Secondary | ICD-10-CM | POA: Diagnosis not present

## 2019-04-28 DIAGNOSIS — G250 Essential tremor: Secondary | ICD-10-CM | POA: Diagnosis not present

## 2019-04-28 DIAGNOSIS — U071 COVID-19: Secondary | ICD-10-CM | POA: Diagnosis not present

## 2019-04-28 DIAGNOSIS — M159 Polyosteoarthritis, unspecified: Secondary | ICD-10-CM | POA: Diagnosis not present

## 2019-04-28 DIAGNOSIS — Z86718 Personal history of other venous thrombosis and embolism: Secondary | ICD-10-CM | POA: Diagnosis not present

## 2019-04-28 DIAGNOSIS — Z7901 Long term (current) use of anticoagulants: Secondary | ICD-10-CM | POA: Diagnosis not present

## 2019-04-28 DIAGNOSIS — I872 Venous insufficiency (chronic) (peripheral): Secondary | ICD-10-CM | POA: Diagnosis not present

## 2019-04-28 DIAGNOSIS — J1282 Pneumonia due to coronavirus disease 2019: Secondary | ICD-10-CM | POA: Diagnosis not present

## 2019-04-28 DIAGNOSIS — M810 Age-related osteoporosis without current pathological fracture: Secondary | ICD-10-CM | POA: Diagnosis not present

## 2019-04-28 DIAGNOSIS — M47816 Spondylosis without myelopathy or radiculopathy, lumbar region: Secondary | ICD-10-CM | POA: Diagnosis not present

## 2019-04-28 DIAGNOSIS — D682 Hereditary deficiency of other clotting factors: Secondary | ICD-10-CM | POA: Diagnosis not present

## 2019-04-29 ENCOUNTER — Ambulatory Visit: Payer: Medicare Other | Admitting: Family Medicine

## 2019-04-30 DIAGNOSIS — U071 COVID-19: Secondary | ICD-10-CM | POA: Diagnosis not present

## 2019-04-30 DIAGNOSIS — J1282 Pneumonia due to coronavirus disease 2019: Secondary | ICD-10-CM | POA: Diagnosis not present

## 2019-04-30 DIAGNOSIS — I110 Hypertensive heart disease with heart failure: Secondary | ICD-10-CM | POA: Diagnosis not present

## 2019-04-30 DIAGNOSIS — D682 Hereditary deficiency of other clotting factors: Secondary | ICD-10-CM | POA: Diagnosis not present

## 2019-04-30 DIAGNOSIS — I5032 Chronic diastolic (congestive) heart failure: Secondary | ICD-10-CM | POA: Diagnosis not present

## 2019-04-30 DIAGNOSIS — J9600 Acute respiratory failure, unspecified whether with hypoxia or hypercapnia: Secondary | ICD-10-CM | POA: Diagnosis not present

## 2019-05-02 ENCOUNTER — Encounter: Payer: Self-pay | Admitting: Family Medicine

## 2019-05-02 ENCOUNTER — Ambulatory Visit (INDEPENDENT_AMBULATORY_CARE_PROVIDER_SITE_OTHER): Payer: Medicare Other | Admitting: Pharmacist

## 2019-05-02 ENCOUNTER — Other Ambulatory Visit: Payer: Self-pay

## 2019-05-02 ENCOUNTER — Ambulatory Visit (HOSPITAL_COMMUNITY): Payer: Medicare Other | Attending: Internal Medicine

## 2019-05-02 DIAGNOSIS — Z7901 Long term (current) use of anticoagulants: Secondary | ICD-10-CM | POA: Diagnosis not present

## 2019-05-02 DIAGNOSIS — I350 Nonrheumatic aortic (valve) stenosis: Secondary | ICD-10-CM | POA: Insufficient documentation

## 2019-05-02 LAB — POCT INR: INR: 2.3 (ref 2.0–3.0)

## 2019-05-03 NOTE — Telephone Encounter (Signed)
(  From Patient's daughter, [name])  [place] Dr Jonni Sanger,    I was just following up on my mom, Robin Harrell, and her vaccine.   When I was in there with her last month you said you would double check her chart but you thought it best to wait a few weeks.   She was diagnosed with Covid on [name] 4th so it feels like it is time to try and get her a shot in the next week or two.   I do have her on a waiting list and they said to just call when she was ready and they would work her in.    So just making sure you feel like it is time.   Thanks   Wendy Heins [phone]

## 2019-05-06 ENCOUNTER — Telehealth: Payer: Self-pay | Admitting: Family Medicine

## 2019-05-06 DIAGNOSIS — J9600 Acute respiratory failure, unspecified whether with hypoxia or hypercapnia: Secondary | ICD-10-CM | POA: Diagnosis not present

## 2019-05-06 DIAGNOSIS — D682 Hereditary deficiency of other clotting factors: Secondary | ICD-10-CM | POA: Diagnosis not present

## 2019-05-06 DIAGNOSIS — J1282 Pneumonia due to coronavirus disease 2019: Secondary | ICD-10-CM | POA: Diagnosis not present

## 2019-05-06 DIAGNOSIS — I110 Hypertensive heart disease with heart failure: Secondary | ICD-10-CM | POA: Diagnosis not present

## 2019-05-06 DIAGNOSIS — U071 COVID-19: Secondary | ICD-10-CM | POA: Diagnosis not present

## 2019-05-06 DIAGNOSIS — I5032 Chronic diastolic (congestive) heart failure: Secondary | ICD-10-CM | POA: Diagnosis not present

## 2019-05-06 NOTE — Telephone Encounter (Signed)
She was seen by me and cardiology on the 23rd of February. BP was good then. What dose was she taking then? Our chart says 50mg  losartan. I recommend taking whatever dose she was on at that time and please adjust her medication list accordingly. Thanks.

## 2019-05-06 NOTE — Telephone Encounter (Signed)
Patient states while she was in rehab she was being given 25mg  of losartan.  Patient states she is back at home now and would like to know if she needs to go back to taking 50mg  of losartan?  Patient has an upcoming appt on 3/25.

## 2019-05-06 NOTE — Telephone Encounter (Signed)
Please advise 

## 2019-05-06 NOTE — Telephone Encounter (Signed)
Patient notified that that she was taking 50 mg of losartan and notified that it is ok to take 50 mg of losartan

## 2019-05-07 ENCOUNTER — Telehealth: Payer: Self-pay

## 2019-05-07 DIAGNOSIS — I350 Nonrheumatic aortic (valve) stenosis: Secondary | ICD-10-CM

## 2019-05-07 DIAGNOSIS — I5189 Other ill-defined heart diseases: Secondary | ICD-10-CM

## 2019-05-07 NOTE — Telephone Encounter (Signed)
Spoke to patient echo results given.Advised to repeat in 12 months. 

## 2019-05-08 ENCOUNTER — Other Ambulatory Visit: Payer: Self-pay | Admitting: Family Medicine

## 2019-05-08 DIAGNOSIS — J9600 Acute respiratory failure, unspecified whether with hypoxia or hypercapnia: Secondary | ICD-10-CM | POA: Diagnosis not present

## 2019-05-08 DIAGNOSIS — D682 Hereditary deficiency of other clotting factors: Secondary | ICD-10-CM | POA: Diagnosis not present

## 2019-05-08 DIAGNOSIS — J1282 Pneumonia due to coronavirus disease 2019: Secondary | ICD-10-CM | POA: Diagnosis not present

## 2019-05-08 DIAGNOSIS — U071 COVID-19: Secondary | ICD-10-CM | POA: Diagnosis not present

## 2019-05-08 DIAGNOSIS — I5032 Chronic diastolic (congestive) heart failure: Secondary | ICD-10-CM | POA: Diagnosis not present

## 2019-05-08 DIAGNOSIS — I110 Hypertensive heart disease with heart failure: Secondary | ICD-10-CM | POA: Diagnosis not present

## 2019-05-09 ENCOUNTER — Ambulatory Visit (INDEPENDENT_AMBULATORY_CARE_PROVIDER_SITE_OTHER): Payer: Medicare Other

## 2019-05-09 ENCOUNTER — Ambulatory Visit (INDEPENDENT_AMBULATORY_CARE_PROVIDER_SITE_OTHER): Payer: Medicare Other | Admitting: Emergency Medicine

## 2019-05-09 ENCOUNTER — Other Ambulatory Visit: Payer: Self-pay

## 2019-05-09 ENCOUNTER — Encounter: Payer: Self-pay | Admitting: Emergency Medicine

## 2019-05-09 DIAGNOSIS — J3089 Other allergic rhinitis: Secondary | ICD-10-CM | POA: Diagnosis not present

## 2019-05-09 DIAGNOSIS — J069 Acute upper respiratory infection, unspecified: Secondary | ICD-10-CM

## 2019-05-09 DIAGNOSIS — J302 Other seasonal allergic rhinitis: Secondary | ICD-10-CM

## 2019-05-09 DIAGNOSIS — U071 COVID-19: Secondary | ICD-10-CM

## 2019-05-09 DIAGNOSIS — L97511 Non-pressure chronic ulcer of other part of right foot limited to breakdown of skin: Secondary | ICD-10-CM

## 2019-05-09 DIAGNOSIS — J1282 Pneumonia due to coronavirus disease 2019: Secondary | ICD-10-CM

## 2019-05-09 NOTE — Assessment & Plan Note (Signed)
COVID-19 in January of this year.  She was admitted, discharged on oxygen, had to go to rehab.  She is now back home.  Her chest x-ray today shows full clearing, resolution of her infiltrates

## 2019-05-09 NOTE — Assessment & Plan Note (Signed)
Has been difficult to manage.  Fairly well controlled currently.  She is on the maximal regimen of nasal rinses, ipratropium nasal spray, Zyrtec, chlor tabs.  She has a nasal steroid available to use as needed.  She had some green nasal discharge and cough last month, resolved with Augmentin, now completed.

## 2019-05-09 NOTE — Patient Instructions (Addendum)
Your chest x-ray shows that your pneumonia and COVID-19 changes have resolved.  This is good news. Please continue your nasal saline rinses, ipratropium nasal spray and Zyrtec as you have been taking them. You can continue to use with exam nasal spray 2 sprays each nostril once daily if you need it to suppress nasal drainage. Agree with getting the COVID-19 vaccine as you have planned. Follow with Dr Lamonte Sakai in 6 months or sooner if you have any problems

## 2019-05-09 NOTE — Progress Notes (Signed)
Subjective:    Patient ID: Robin Harrell, female    DOB: [birth year], 86 y.o.   MRN: [id number]  Cough   ROV 03/15/18 -- Robin Harrell is 72, minimal smoker, with a history of chronic thromboembolic disease and PE due to factor V Leiden deficiency.  She has chronic rhinitis, GERD with a hiatal hernia and associated chronic cough.  There is been some question of possible intermittent aspiration, choking contributing to her cough as well.  We have been treating her with ipratropium nasal spray.  She has been on Flonase in the past. Currently on chlorpheniramine and zyrtec. She ran out of atrovent NS last week - hoarseness, drainage, cough have flared. She remains on coumadin.   ROV 05/09/19 --86 year old woman with a history of chronic thromboembolic disease and PE with factor V Leiden deficiency, chronic cough due to rhinitis, GERD with hiatal hernia, intermittent aspiration/choking with some associated hoarseness.  She unfortunately had COVID-19 pneumonitis in January 2021, and is on supplemental oxygen.  She was treated 04/08/2019 for possible bronchitis versus pneumonia with Augmentin.  Currently managed on nasal saline rinses, ipratropium nasal spray, Zyrtec, chlor tabs. Still has breakthrough nasal gtt.  She reports that her mucous has cleared up - no longer green. Her cough is improved. She is not on any BD's. She still has a Higher education careers adviser and PT working with her.  Chest x-ray done today reviewed by me, shows marked improvement in bilateral interstitial infiltrates compared with 02/25/2019.   Review of Systems  Respiratory: Positive for cough.    As per HPI     Objective:   Physical Exam Vitals:   05/09/19 1608  BP: 112/66  Pulse: 83  Temp: (!) 97.4 F (36.3 C)  TempSrc: Temporal  SpO2: 98%  Weight: 127 lb 6.4 oz (57.8 kg)  Height: 5' 2.5" (1.588 m)    Gen: Pleasant, well-nourished, in no distress,  normal affect  ENT: No lesions,  mouth clear,  oropharynx clear, some hoarse  voice  Neck: No JVD,   Lungs: No use of accessory muscles, clear without rales or rhonchi  Cardiovascular: RRR, 3/6 blowing holosystolic M, intact S2  Musculoskeletal: No deformities, no cyanosis or clubbing  Neuro: alert, non focal  Skin: Warm, no lesions or rashes   Current Outpatient Medications:  .  acetaminophen (TYLENOL) 500 MG tablet, Take 1,000 mg by mouth every 6 (six) hours as needed for moderate pain., Disp: , Rfl:  .  atorvastatin (LIPITOR) 10 MG tablet, TAKE 1 TABLET (10 MG TOTAL) BY MOUTH DAILY AT 6 PM. (Patient taking differently: Take 10 mg by mouth at bedtime. ), Disp: 90 tablet, Rfl: 2 .  brimonidine (ALPHAGAN) 0.15 % ophthalmic solution, Place 2 drops into both eyes 2 (two) times daily. , Disp: , Rfl: 3 .  Calcium-Phosphorus-Vitamin D (CALCIUM/D3 ADULT GUMMIES) 200-96.6-200 MG-MG-UNIT CHEW, Chew 2 tablets by mouth daily., Disp: , Rfl:  .  cetirizine (ZYRTEC) 10 MG tablet, TAKE 1 TABLET BY MOUTH EVERY DAY, Disp: 90 tablet, Rfl: 3 .  chlorpheniramine (CHLOR-TRIMETON) 4 MG tablet, Take 4 mg by mouth every morning. , Disp: , Rfl:  .  fluticasone (FLONASE) 50 MCG/ACT nasal spray, SPRAY 2 SPRAYS INTO EACH NOSTRIL EVERY DAY, Disp: 48 mL, Rfl: 1 .  furosemide (LASIX) 20 MG tablet, TAKE 1 TABLET (20 MG TOTAL) BY MOUTH DAILY AS NEEDED FOR EDEMA., Disp: 30 tablet, Rfl: 5 .  ipratropium (ATROVENT) 0.03 % nasal spray, PLACE 2 SPRAYS INTO BOTH NOSTRILS 3 (THREE) TIMES DAILY AS  NEEDED FOR RHINITIS., Disp: 30 mL, Rfl: 6 .  latanoprost (XALATAN) 0.005 % ophthalmic solution, Place 1 drop into both eyes at bedtime. , Disp: , Rfl:  .  levothyroxine (SYNTHROID) 25 MCG tablet, Take 1 tablet (25 mcg total) by mouth daily before breakfast., Disp: 90 tablet, Rfl: 3 .  losartan (COZAAR) 50 MG tablet, Take 1 tablet (50 mg total) by mouth daily., Disp: 90 tablet, Rfl: 1 .  metoprolol tartrate (LOPRESSOR) 25 MG tablet, TAKE 1 TABLET (25 MG TOTAL) BY MOUTH 2 (TWO) TIMES DAILY. *NEEDS OFFICE VISIT  FOR FURTHER REFILLS*, Disp: 90 tablet, Rfl: 3 .  multivitamin-iron-minerals-folic acid (CENTRUM) chewable tablet, Chew 1 tablet by mouth daily., Disp: , Rfl:  .  triamcinolone cream (KENALOG) 0.1 %, Apply 1 application topically 2 (two) times daily. For 2 weeks, then as needed, Disp: 45 g, Rfl: 0 .  vitamin E (VITAMIN E) 1000 UNIT capsule, Take 1,000 Units by mouth daily., Disp: , Rfl:  .  warfarin (COUMADIN) 7.5 MG tablet, TAKE 1/2 TO 1 TABLET BY MOUTH DAILY AS DIRECTED, Disp: 90 tablet, Rfl: 1      Assessment & Plan:   Acute respiratory disease due to COVID-19 virus COVID-19 in January of this year.  She was admitted, discharged on oxygen, had to go to rehab.  She is now back home.  Her chest x-ray today shows full clearing, resolution of her infiltrates  Seasonal and perennial allergic rhinitis Has been difficult to manage.  Fairly well controlled currently.  She is on the maximal regimen of nasal rinses, ipratropium nasal spray, Zyrtec, chlor tabs.  She has a nasal steroid available to use as needed.  She had some green nasal discharge and cough last month, resolved with Augmentin, now completed.  Baltazar Apo, MD, PhD 05/09/2019, 4:23 PM [facility] [phone] or if no answer [phone]

## 2019-05-10 ENCOUNTER — Other Ambulatory Visit: Payer: Self-pay | Admitting: Family Medicine

## 2019-05-13 DIAGNOSIS — D682 Hereditary deficiency of other clotting factors: Secondary | ICD-10-CM | POA: Diagnosis not present

## 2019-05-13 DIAGNOSIS — I5032 Chronic diastolic (congestive) heart failure: Secondary | ICD-10-CM | POA: Diagnosis not present

## 2019-05-13 DIAGNOSIS — J1282 Pneumonia due to coronavirus disease 2019: Secondary | ICD-10-CM | POA: Diagnosis not present

## 2019-05-13 DIAGNOSIS — J9600 Acute respiratory failure, unspecified whether with hypoxia or hypercapnia: Secondary | ICD-10-CM | POA: Diagnosis not present

## 2019-05-13 DIAGNOSIS — U071 COVID-19: Secondary | ICD-10-CM | POA: Diagnosis not present

## 2019-05-13 DIAGNOSIS — I110 Hypertensive heart disease with heart failure: Secondary | ICD-10-CM | POA: Diagnosis not present

## 2019-05-15 ENCOUNTER — Other Ambulatory Visit: Payer: Self-pay | Admitting: Pulmonary Disease

## 2019-05-15 ENCOUNTER — Telehealth: Payer: Self-pay | Admitting: Pulmonary Disease

## 2019-05-15 DIAGNOSIS — R911 Solitary pulmonary nodule: Secondary | ICD-10-CM

## 2019-05-15 NOTE — Progress Notes (Unsigned)
Ct chest

## 2019-05-15 NOTE — Telephone Encounter (Signed)
I called and spoke with the pt  She was asking about why the CT Chest needed to be done  I advised based on her cxr result note-     Lauraine Rinne, NP  05/10/2019 9:01 AM EDT    Chest x-ray reviewed. Also discussed with Dr. Lamonte Sakai at office visit yesterday. Radiology reading there may be a small 7 mm right upper lobe nodule. This likely is directly related to patient's recent COVID-19 infection.  We can plan on repeating a CT of patient's chest sometime in April/2021. To get better imaging of this.   Please place order for CT Chest without contrast - associate to 71mm nodule on cxry.   Wyn Quaker FNP    She verbalized understanding.

## 2019-05-16 ENCOUNTER — Encounter: Payer: Self-pay | Admitting: Family Medicine

## 2019-05-16 ENCOUNTER — Other Ambulatory Visit: Payer: Self-pay

## 2019-05-16 ENCOUNTER — Ambulatory Visit (INDEPENDENT_AMBULATORY_CARE_PROVIDER_SITE_OTHER): Payer: Medicare Other | Admitting: Family Medicine

## 2019-05-16 VITALS — BP 110/64 | HR 77 | Temp 97.1°F | Ht 62.6 in | Wt 124.6 lb

## 2019-05-16 DIAGNOSIS — U071 COVID-19: Secondary | ICD-10-CM

## 2019-05-16 DIAGNOSIS — I5189 Other ill-defined heart diseases: Secondary | ICD-10-CM | POA: Diagnosis not present

## 2019-05-16 DIAGNOSIS — D649 Anemia, unspecified: Secondary | ICD-10-CM | POA: Diagnosis not present

## 2019-05-16 DIAGNOSIS — I872 Venous insufficiency (chronic) (peripheral): Secondary | ICD-10-CM | POA: Diagnosis not present

## 2019-05-16 DIAGNOSIS — Z8709 Personal history of other diseases of the respiratory system: Secondary | ICD-10-CM | POA: Diagnosis not present

## 2019-05-16 DIAGNOSIS — R600 Localized edema: Secondary | ICD-10-CM

## 2019-05-16 DIAGNOSIS — J1282 Pneumonia due to coronavirus disease 2019: Secondary | ICD-10-CM

## 2019-05-16 DIAGNOSIS — E440 Moderate protein-calorie malnutrition: Secondary | ICD-10-CM

## 2019-05-16 LAB — CBC WITH DIFFERENTIAL/PLATELET
Basophils Absolute: 0 10*3/uL (ref 0.0–0.1)
Basophils Relative: 1.2 % (ref 0.0–3.0)
Eosinophils Absolute: 0.1 10*3/uL (ref 0.0–0.7)
Eosinophils Relative: 2.1 % (ref 0.0–5.0)
HCT: 33.7 % — ABNORMAL LOW (ref 36.0–46.0)
Hemoglobin: 11.2 g/dL — ABNORMAL LOW (ref 12.0–15.0)
Lymphocytes Relative: 21 % (ref 12.0–46.0)
Lymphs Abs: 0.8 10*3/uL (ref 0.7–4.0)
MCHC: 33.2 g/dL (ref 30.0–36.0)
MCV: 93.8 fl (ref 78.0–100.0)
Monocytes Absolute: 0.5 10*3/uL (ref 0.1–1.0)
Monocytes Relative: 13.9 % — ABNORMAL HIGH (ref 3.0–12.0)
Neutro Abs: 2.2 10*3/uL (ref 1.4–7.7)
Neutrophils Relative %: 61.8 % (ref 43.0–77.0)
Platelets: 120 10*3/uL — ABNORMAL LOW (ref 150.0–400.0)
RBC: 3.6 Mil/uL — ABNORMAL LOW (ref 3.87–5.11)
RDW: 14.2 % (ref 11.5–15.5)
WBC: 3.6 10*3/uL — ABNORMAL LOW (ref 4.0–10.5)

## 2019-05-16 LAB — COMPREHENSIVE METABOLIC PANEL
ALT: 11 U/L (ref 0–35)
AST: 18 U/L (ref 0–37)
Albumin: 4 g/dL (ref 3.5–5.2)
Alkaline Phosphatase: 57 U/L (ref 39–117)
BUN: 20 mg/dL (ref 6–23)
CO2: 29 mEq/L (ref 19–32)
Calcium: 9 mg/dL (ref 8.4–10.5)
Chloride: 103 mEq/L (ref 96–112)
Creatinine, Ser: 0.99 mg/dL (ref 0.40–1.20)
GFR: 53.52 mL/min — ABNORMAL LOW (ref >60.00)
Glucose, Bld: 102 mg/dL — ABNORMAL HIGH (ref 70–99)
Potassium: 4.2 mEq/L (ref 3.5–5.1)
Sodium: 137 mEq/L (ref 135–145)
Total Bilirubin: 0.6 mg/dL (ref 0.2–1.2)
Total Protein: 6.4 g/dL (ref 6.0–8.3)

## 2019-05-16 LAB — B12 AND FOLATE PANEL
Folate: 18.5 ng/mL (ref >5.9)
Vitamin B-12: 393 pg/mL (ref 211–911)

## 2019-05-16 NOTE — Progress Notes (Signed)
Subjective  CC:  Chief Complaint  Patient presents with  . Cough    much better, still experiencing SOB. seen pulmonary yesterday. found small nodule on lung. CT lung scheduled for 05/30/19  . Hypertension    checks at [facility] and readings are good. Denies HA and visual changes    HPI: Robin Harrell is a 86 y.o. female who presents to the office today to address the problems listed above in the chief complaint.  Here for f/u after covid pneumonia and respiratory failure again. Reviewed recent pulmonology notes. She continues to improve. No longer with SOB or cough. Was treated for bronchitis at last visit and she improved from that as well; no longer with sputum production or cough. Strength is improved but not yet all the way back to her baseline. Not yet driving. Appetite is fair but can't eat as much as before. Adding ensure supplements when she can. Weight is trending downward though. Had B LE after rehab treated with lasix: now resolved. No longer on diuretic.   Other chronic problems are controlled.   cxr reviewed: cleared lung infiltrates; possible sm lung nodule> Pulm ordered CT next month for further clarification.   Wt Readings from Last 3 Encounters:  05/16/19 124 lb 9.6 oz (56.5 kg)  05/09/19 127 lb 6.4 oz (57.8 kg)  04/16/19 127 lb (57.6 kg)    Assessment  1. Pneumonia due to COVID-19 virus   2. History of respiratory failure   3. Chronic venous stasis dermatitis of both lower extremities   4. Anemia, unspecified type   5. Edema of right lower extremity   6. Diastolic dysfunction      Plan   covid pneumonia f/u:  Improving. Will recheck labs: anemia, low albumin, renal status and lytes after lasix. Recommend continue with home exercises for strengthening. Discussed nutrition  Edema in legs; much improved. Prn lasix. Some weight loss was fluid related  Anemia: recheck.   Cough, chronic GERD and HTN : no changes.   Follow up: recheck 6 months    10/15/2019  No orders of the defined types were placed in this encounter.  No orders of the defined types were placed in this encounter.     I reviewed the patients updated PMH, FH, and SocHx.    Patient Active Problem List   Diagnosis Date Noted  . Hypothyroidism (acquired) 09/02/2014    Priority: High  . Hyperlipidemia 11/19/2012    Priority: High  . Long term (current) use of anticoagulants 05/10/2012    Priority: High  . Diastolic dysfunction [id number]    Priority: High  . Factor V deficiency ([place]) 02/09/2010    Priority: High  . Osteoporosis 02/09/2010    Priority: High  . Essential hypertension 11/15/2007    Priority: High  . Chronic venous stasis dermatitis of both lower extremities 12/31/2018    Priority: Medium  . Aortic stenosis, mild 02/07/2017    Priority: Medium  . Chronic idiopathic thrombocytopenia ([facility]) 04/14/2016    Priority: Medium  . Essential tremor 09/03/2015    Priority: Medium  . Edema of right lower extremity 02/01/2013    Priority: Medium  . Colon polyp 10/15/2012    Priority: Medium  . Insomnia 10/11/2011    Priority: Medium  . Osteoarthritis, multiple sites 06/30/2011    Priority: Medium  . DJD (degenerative joint disease), lumbar 10/14/2009    Priority: Medium  . Glaucoma 01/29/2008    Priority: Medium  . History of DVT (deep vein thrombosis)  11/15/2007    Priority: Medium  . GERD 11/15/2007    Priority: Medium  . COUGH, CHRONIC 11/15/2007    Priority: Medium  . 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 History of recurrent UTIs 01/26/2017    Priority: Low  . History of respiratory failure 11/11/2014    Priority: Low  . Seasonal and perennial allergic rhinitis 11/15/2007    Priority: Low  . Skin ulcer of toe of right foot, limited to breakdown of skin ([place]) 05/09/2019  . Acute respiratory disease due to COVID-19 virus 02/26/2019  . Pneumonia due to COVID-19 virus 02/26/2019  . Acute respiratory failure due  to COVID-19 ([place]) 02/25/2019   Current Meds  Medication Sig  . acetaminophen (TYLENOL) 500 MG tablet Take 1,000 mg by mouth every 6 (six) hours as needed for moderate pain.  Marland Kitchen atorvastatin (LIPITOR) 10 MG tablet TAKE 1 TABLET (10 MG TOTAL) BY MOUTH DAILY AT 6 PM. (Patient taking differently: Take 10 mg by mouth at bedtime. )  . brimonidine (ALPHAGAN) 0.15 % ophthalmic solution Place 2 drops into both eyes 2 (two) times daily.   . Calcium-Phosphorus-Vitamin D (CALCIUM/D3 ADULT GUMMIES) 200-96.6-200 MG-MG-UNIT CHEW Chew 2 tablets by mouth daily.  . cetirizine (ZYRTEC) 10 MG tablet TAKE 1 TABLET BY MOUTH EVERY DAY  . chlorpheniramine (CHLOR-TRIMETON) 4 MG tablet Take 4 mg by mouth every morning.   . fluticasone (FLONASE) 50 MCG/ACT nasal spray SPRAY 2 SPRAYS INTO EACH NOSTRIL EVERY DAY  . ipratropium (ATROVENT) 0.03 % nasal spray PLACE 2 SPRAYS INTO BOTH NOSTRILS 3 (THREE) TIMES DAILY AS NEEDED FOR RHINITIS.  Marland Kitchen latanoprost (XALATAN) 0.005 % ophthalmic solution Place 1 drop into both eyes at bedtime.   Marland Kitchen levothyroxine (SYNTHROID) 25 MCG tablet Take 1 tablet (25 mcg total) by mouth daily before breakfast.  . losartan (COZAAR) 50 MG tablet Take 1 tablet (50 mg total) by mouth daily.  . metoprolol tartrate (LOPRESSOR) 25 MG tablet TAKE 1 TABLET (25 MG TOTAL) BY MOUTH 2 (TWO) TIMES DAILY. *NEEDS OFFICE VISIT FOR FURTHER REFILLS*  . multivitamin-iron-minerals-folic acid (CENTRUM) chewable tablet Chew 1 tablet by mouth daily.  Marland Kitchen triamcinolone cream (KENALOG) 0.1 % APPLY 1 APPLICATION TOPICALLY 2 (TWO) TIMES DAILY. FOR 2 WEEKS, THEN AS NEEDED  . vitamin E (VITAMIN E) 1000 UNIT capsule Take 1,000 Units by mouth daily.  Marland Kitchen warfarin (COUMADIN) 7.5 MG tablet TAKE 1/2 TO 1 TABLET BY MOUTH DAILY AS DIRECTED    Allergies: Patient is allergic to azithromycin; codeine; and erythromycin. Family History: Patient family history includes Allergic rhinitis in her sister; Breast cancer (age of onset: 32) in her  daughter; Cirrhosis in her father; Coronary artery disease in her maternal grandfather and paternal grandfather; Heart disease in her paternal grandmother; Lung cancer in her father; Skin cancer in her brother; Throat cancer in her maternal grandfather and paternal grandfather. Social History:  Patient  reports that she quit smoking about 61 years ago. Her smoking use included cigarettes. She quit after 1.00 year of use. She has never used smokeless tobacco. She reports current alcohol use. She reports that she does not use drugs.  Review of Systems: Constitutional: Negative for fever malaise or anorexia Cardiovascular: negative for chest pain Respiratory: negative for SOB or persistent cough Gastrointestinal: negative for abdominal pain  Objective  Vitals: BP 110/64 (BP Location: Left Arm, Patient Position: Sitting, Cuff Size: Normal)   Pulse 77   Temp (!) 97.1 F (36.2 C) (Temporal)   Ht 5'  2.6" (1.59 m)   Wt 124 lb 9.6 oz (56.5 kg)   LMP  (LMP Unknown)   SpO2 95%   BMI 22.35 kg/m  General: no acute distress , A&Ox3, looks better HEENT: PEERL, conjunctiva normal, Cardiovascular:  RRR with murmur Respiratory:  Good breath sounds bilaterally, CTAB with normal respiratory effort Skin:  Warm, no rashes LE w/ trace edema today. Look much better     Commons side effects, risks, benefits, and alternatives for medications and treatment plan prescribed today were discussed, and the patient expressed understanding of the given instructions. Patient is instructed to call or message via MyChart if he/she has any questions or concerns regarding our treatment plan. No barriers to understanding were identified. We discussed Red Flag symptoms and signs in detail. Patient expressed understanding regarding what to do in case of urgent or emergency type symptoms.   Medication list was reconciled, printed and provided to the patient in AVS. Patient instructions and summary information was reviewed  with the patient as documented in the AVS. This note was prepared with assistance of Dragon voice recognition software. Occasional wrong-word or sound-a-like substitutions may have occurred due to the inherent limitations of voice recognition software  This visit occurred during the SARS-CoV-2 public health emergency.  Safety protocols were in place, including screening questions prior to the visit, additional usage of staff PPE, and extensive cleaning of exam room while observing appropriate contact time as indicated for disinfecting solutions.

## 2019-05-16 NOTE — Patient Instructions (Signed)
Please return in 6 months for recheck ° °If you have any questions or concerns, please don't hesitate to send me a message via MyChart or call the office at [phone]. Thank you for visiting with us today! It's our pleasure caring for you.  °

## 2019-05-17 LAB — IRON,TIBC AND FERRITIN PANEL
%SAT: 14 % (calc) — ABNORMAL LOW (ref 16–45)
Ferritin: 121 ng/mL (ref 16–288)
Iron: 46 ug/dL (ref 45–160)
TIBC: 320 mcg/dL (calc) (ref 250–450)

## 2019-05-23 ENCOUNTER — Other Ambulatory Visit: Payer: Self-pay

## 2019-05-23 ENCOUNTER — Ambulatory Visit (INDEPENDENT_AMBULATORY_CARE_PROVIDER_SITE_OTHER): Payer: Medicare Other | Admitting: Pharmacist

## 2019-05-23 DIAGNOSIS — J9600 Acute respiratory failure, unspecified whether with hypoxia or hypercapnia: Secondary | ICD-10-CM | POA: Diagnosis not present

## 2019-05-23 DIAGNOSIS — I5032 Chronic diastolic (congestive) heart failure: Secondary | ICD-10-CM | POA: Diagnosis not present

## 2019-05-23 DIAGNOSIS — Z7901 Long term (current) use of anticoagulants: Secondary | ICD-10-CM

## 2019-05-23 DIAGNOSIS — U071 COVID-19: Secondary | ICD-10-CM | POA: Diagnosis not present

## 2019-05-23 DIAGNOSIS — I110 Hypertensive heart disease with heart failure: Secondary | ICD-10-CM | POA: Diagnosis not present

## 2019-05-23 DIAGNOSIS — D682 Hereditary deficiency of other clotting factors: Secondary | ICD-10-CM | POA: Diagnosis not present

## 2019-05-23 DIAGNOSIS — J1282 Pneumonia due to coronavirus disease 2019: Secondary | ICD-10-CM | POA: Diagnosis not present

## 2019-05-23 LAB — POCT INR: INR: 1.7 — AB (ref 2.0–3.0)

## 2019-05-24 ENCOUNTER — Ambulatory Visit (HOSPITAL_COMMUNITY): Payer: Medicare Other

## 2019-05-30 ENCOUNTER — Other Ambulatory Visit: Payer: Self-pay

## 2019-05-30 ENCOUNTER — Ambulatory Visit (HOSPITAL_COMMUNITY)
Admission: RE | Admit: 2019-05-30 | Discharge: 2019-05-30 | Disposition: A | Discharge status: Home / Self Care | Payer: Medicare Other | Source: Ambulatory Visit | Attending: Pulmonary Disease | Admitting: Pulmonary Disease

## 2019-05-30 DIAGNOSIS — R911 Solitary pulmonary nodule: Secondary | ICD-10-CM

## 2019-05-30 DIAGNOSIS — J9811 Atelectasis: Secondary | ICD-10-CM | POA: Diagnosis not present

## 2019-06-04 NOTE — Progress Notes (Signed)
Patient identification verified. Message from [place] provided, CT chest completed. Results reviewed. No pulmonary nodule seen on CT. This is good news.  Please route results to primary care. Patient does have arthrosclerosis on CT imaging. Primary care needs to coordinate this with either cardiology or themselves with cholesterol levels.  No further imaging needed from a pulmonary standpoint at this time.  No new recommendations.  Keep 50-month follow-up with Dr. Lamonte Sakai.  Message routed to PCP, patient verbalized understanding of results and plan of care.

## 2019-06-06 ENCOUNTER — Ambulatory Visit (INDEPENDENT_AMBULATORY_CARE_PROVIDER_SITE_OTHER): Payer: Medicare Other | Admitting: Pharmacist

## 2019-06-06 ENCOUNTER — Other Ambulatory Visit: Payer: Self-pay

## 2019-06-06 DIAGNOSIS — Z7901 Long term (current) use of anticoagulants: Secondary | ICD-10-CM | POA: Diagnosis not present

## 2019-06-06 LAB — POCT INR: INR: 1.9 — AB (ref 2.0–3.0)

## 2019-06-18 ENCOUNTER — Other Ambulatory Visit: Payer: Self-pay | Admitting: Cardiovascular Disease

## 2019-06-25 ENCOUNTER — Telehealth: Payer: Self-pay | Admitting: Family Medicine

## 2019-06-25 MED ORDER — AMOXICILLIN 500 MG PO CAPS: 2000.0000 mg | ORAL_CAPSULE | Freq: Once | ORAL | 2 refills | Status: AC

## 2019-06-25 NOTE — Telephone Encounter (Signed)
Pt is requesting Amoxicillin pre Dental procedure.  LOV: 05/16/19 Next Appointment: 11/18/19  Please Advise.

## 2019-06-25 NOTE — Telephone Encounter (Signed)
amox ordered for pt.  Please notify.

## 2019-06-25 NOTE — Telephone Encounter (Signed)
Pt called asking if she could have script sent in for amoxicillin. She has a dental appt tomorrow and her dentist will no longer write the script for her. If unable to prescribe, please let pt know.

## 2019-06-25 NOTE — Telephone Encounter (Signed)
Pt has been notified.

## 2019-06-27 ENCOUNTER — Other Ambulatory Visit: Payer: Self-pay

## 2019-06-27 ENCOUNTER — Ambulatory Visit (INDEPENDENT_AMBULATORY_CARE_PROVIDER_SITE_OTHER): Payer: Medicare Other | Admitting: Pharmacist

## 2019-06-27 DIAGNOSIS — Z7901 Long term (current) use of anticoagulants: Secondary | ICD-10-CM

## 2019-06-27 LAB — POCT INR: INR: 1.4 — AB (ref 2.0–3.0)

## 2019-07-01 ENCOUNTER — Ambulatory Visit (INDEPENDENT_AMBULATORY_CARE_PROVIDER_SITE_OTHER): Payer: Medicare Other | Admitting: Podiatry

## 2019-07-01 ENCOUNTER — Encounter: Payer: Self-pay | Admitting: Podiatry

## 2019-07-01 ENCOUNTER — Other Ambulatory Visit: Payer: Self-pay

## 2019-07-01 VITALS — Temp 97.3°F

## 2019-07-01 DIAGNOSIS — B353 Tinea pedis: Secondary | ICD-10-CM | POA: Diagnosis not present

## 2019-07-01 DIAGNOSIS — M79676 Pain in unspecified toe(s): Secondary | ICD-10-CM

## 2019-07-01 DIAGNOSIS — L84 Corns and callosities: Secondary | ICD-10-CM | POA: Diagnosis not present

## 2019-07-01 DIAGNOSIS — B351 Tinea unguium: Secondary | ICD-10-CM | POA: Diagnosis not present

## 2019-07-01 DIAGNOSIS — D6851 Activated protein C resistance: Secondary | ICD-10-CM

## 2019-07-01 MED ORDER — CLOTRIMAZOLE-BETAMETHASONE 1-0.05 % EX CREA: TOPICAL_CREAM | CUTANEOUS | 0 refills | Status: DC

## 2019-07-01 NOTE — Patient Instructions (Addendum)

## 2019-07-04 DIAGNOSIS — H401132 Primary open-angle glaucoma, bilateral, moderate stage: Secondary | ICD-10-CM | POA: Diagnosis not present

## 2019-07-08 NOTE — Progress Notes (Signed)
Subjective: Robin Harrell is a 86 y.o. female patient seen today at [facility]. Patient has history of clotting disorder, Factor V Leiden. She has h/o painful mycotic toenails and chronic corn formation with h/o ulceration.   Today, she relates skin eruption with itching for the past 2-3 weeks. Location is forefoot area of both feet. She has been using triamcinolone cream and it is still symptomatic.  Patient Active Problem List   Diagnosis Date Noted  . Skin ulcer of toe of right foot, limited to breakdown of skin ([place]) 05/09/2019  . Acute respiratory disease due to COVID-19 virus 02/26/2019  . Pneumonia due to COVID-19 virus 02/26/2019  . Acute respiratory failure due to COVID-19 ([place]) 02/25/2019  . 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 Chronic venous stasis dermatitis of both lower extremities 12/31/2018  . Aortic stenosis, mild 02/07/2017  . History of recurrent UTIs 01/26/2017  . Chronic idiopathic thrombocytopenia ([place]) 04/14/2016  . Essential tremor 09/03/2015  . History of respiratory failure 11/11/2014  . Hypothyroidism (acquired) 09/02/2014  . Edema of right lower extremity 02/01/2013  . Hyperlipidemia 11/19/2012  . Colon polyp 10/15/2012  . Long term (current) use of anticoagulants 05/10/2012  . Insomnia 10/11/2011  . Osteoarthritis, multiple sites 06/30/2011  . Diastolic dysfunction [id number]  . Factor V deficiency ([place]) 02/09/2010  . Osteoporosis 02/09/2010  . DJD (degenerative joint disease), lumbar 10/14/2009  . Glaucoma 01/29/2008  . Essential hypertension 11/15/2007  . History of DVT (deep vein thrombosis) 11/15/2007  . Seasonal and perennial allergic rhinitis 11/15/2007  . GERD 11/15/2007  . COUGH, CHRONIC 11/15/2007    Current Outpatient Medications on File Prior to Visit  Medication Sig Dispense Refill  . acetaminophen (TYLENOL) 500 MG tablet Take 1,000 mg by mouth every 6 (six) hours as needed for moderate pain.    Marland Kitchen atorvastatin (LIPITOR) 10  MG tablet TAKE 1 TABLET (10 MG TOTAL) BY MOUTH DAILY AT 6 PM. 90 tablet 2  . brimonidine (ALPHAGAN) 0.15 % ophthalmic solution Place 2 drops into both eyes 2 (two) times daily.   3  . Calcium-Phosphorus-Vitamin D (CALCIUM/D3 ADULT GUMMIES) 200-96.6-200 MG-MG-UNIT CHEW Chew 2 tablets by mouth daily.    . cetirizine (ZYRTEC) 10 MG tablet TAKE 1 TABLET BY MOUTH EVERY DAY 90 tablet 3  . chlorpheniramine (CHLOR-TRIMETON) 4 MG tablet Take 4 mg by mouth every morning.     . fluticasone (FLONASE) 50 MCG/ACT nasal spray SPRAY 2 SPRAYS INTO EACH NOSTRIL EVERY DAY 48 mL 1  . ipratropium (ATROVENT) 0.03 % nasal spray PLACE 2 SPRAYS INTO BOTH NOSTRILS 3 (THREE) TIMES DAILY AS NEEDED FOR RHINITIS. 30 mL 6  . latanoprost (XALATAN) 0.005 % ophthalmic solution Place 1 drop into both eyes at bedtime.     Marland Kitchen levothyroxine (SYNTHROID) 25 MCG tablet Take 1 tablet (25 mcg total) by mouth daily before breakfast. 90 tablet 3  . losartan (COZAAR) 50 MG tablet Take 1 tablet (50 mg total) by mouth daily. 90 tablet 1  . metoprolol tartrate (LOPRESSOR) 25 MG tablet TAKE 1 TABLET (25 MG TOTAL) BY MOUTH 2 (TWO) TIMES DAILY. *NEEDS OFFICE VISIT FOR FURTHER REFILLS* 90 tablet 3  . multivitamin-iron-minerals-folic acid (CENTRUM) chewable tablet Chew 1 tablet by mouth daily.    Marland Kitchen triamcinolone cream (KENALOG) 0.1 % APPLY 1 APPLICATION TOPICALLY 2 (TWO) TIMES DAILY. FOR 2 WEEKS, THEN AS NEEDED 60 g 3  . vitamin E (VITAMIN E) 1000 UNIT capsule Take 1,000 Units by mouth daily.    Marland Kitchen warfarin (  COUMADIN) 7.5 MG tablet TAKE 1/2 TO 1 TABLET BY MOUTH DAILY AS DIRECTED 90 tablet 1  . furosemide (LASIX) 20 MG tablet TAKE 1 TABLET (20 MG TOTAL) BY MOUTH DAILY AS NEEDED FOR EDEMA. (Patient not taking: Reported on 07/01/2019) 30 tablet 5   No current facility-administered medications on file prior to visit.    Allergies  Allergen Reactions  . Azithromycin Nausea Only  . Codeine Nausea Only  . Erythromycin Nausea And Vomiting     Objective: Physical Exam  General: Robin Harrell is a pleasant 86 y.o. y.o. Caucasian female, WD, WN in NAD. AAO x 3.   Vascular:  Capillary fill time to digits <3 seconds b/l. Palpable DP pulses b/l. Palpable PT pulses b/l. Pedal hair sparse b/l. Skin temperature gradient within normal limits b/l.  Dermatological:  Pedal skin is thin shiny, atrophic bilaterally. No open wounds bilaterally. Toenails 1-5 b/l elongated, dystrophic, thickened, crumbly with subungual debris and tenderness to dorsal palpation. Skin eruption noted dorsal forefoot area with erythema which blanches with palpation. No blistering, no increased warmth. No flocculence. No weeping.Marland Kitchen Hyperkeratotic lesion(s) R 3rd toe.  No erythema, no edema, no drainage, no flocculence.  Musculoskeletal:  Normal muscle strength 5/5 to all lower extremity muscle groups bilaterally. No pain crepitus or joint limitation noted with ROM b/l. Hammertoes noted to the 2-5 bilaterally.  Neurological:  Protective sensation intact 5/5 intact bilaterally with 10g monofilament b/l. Vibratory sensation intact b/l.  Assessment and Plan:  1. Tinea pedis of both feet    -Examined patient. -Toenails 1-5 b/l were debrided in length and girth with sterile nail nippers and dremel without iatrogenic bleeding.  -Corn(s) R 3rd toe pared utilizing sterile scalpel blade without complication or incident. Total number debrided=1. Continue digital toe cap daily for protection -Patient to continue soft, supportive shoe gear daily. -Patient to report any pedal injuries to medical professional immediately. --For inflammatory tinea pedis, prescription written for Lotrisone Lotion to be applied to both feet twice daily. -Patient/POA to call should there be question/concern in the interim.  Return in about 9 weeks (around 09/02/2019).  Marzetta Board, DPM

## 2019-07-11 ENCOUNTER — Other Ambulatory Visit: Payer: Self-pay

## 2019-07-11 ENCOUNTER — Ambulatory Visit (INDEPENDENT_AMBULATORY_CARE_PROVIDER_SITE_OTHER): Payer: Medicare Other | Admitting: Pharmacist

## 2019-07-11 DIAGNOSIS — Z7901 Long term (current) use of anticoagulants: Secondary | ICD-10-CM | POA: Diagnosis not present

## 2019-07-11 LAB — POCT INR: INR: 2.3 (ref 2.0–3.0)

## 2019-07-26 ENCOUNTER — Telehealth: Payer: Self-pay | Admitting: *Deleted

## 2019-07-26 MED ORDER — CLOTRIMAZOLE-BETAMETHASONE 1-0.05 % EX CREA: TOPICAL_CREAM | CUTANEOUS | 0 refills | Status: DC

## 2019-07-26 NOTE — Telephone Encounter (Signed)
Called prescription in per Dr Elisha Ponder and called and left a message with the patient. Lattie Haw

## 2019-07-26 NOTE — Addendum Note (Signed)
Addended by: Cranford Mon R on: 07/26/2019 02:17 PM   Modules accepted: Orders

## 2019-08-01 ENCOUNTER — Telehealth: Payer: Self-pay | Admitting: Emergency Medicine

## 2019-08-01 MED ORDER — DOXYCYCLINE HYCLATE 100 MG PO TABS
100.0000 mg | ORAL_TABLET | Freq: Two times a day (BID) | ORAL | 0 refills | Status: AC
Start: 2019-08-01 — End: 2019-08-06

## 2019-08-01 NOTE — Telephone Encounter (Signed)
Spoke with pt. She is aware of Dr. Agustina Caroli response. Rx has been sent in. Nothing further was needed.

## 2019-08-01 NOTE — Telephone Encounter (Signed)
Have her take doxycycline 100mg  bid x 5 days instead

## 2019-08-01 NOTE — Telephone Encounter (Signed)
Spoke with patient. She verbalized understanding and is interested in the antibiotic. She stated that she can not take azithromycin due to the upset stomach it causes.   RB, please advise. Thanks!

## 2019-08-01 NOTE — Telephone Encounter (Signed)
Left message for patient to call back  

## 2019-08-01 NOTE — Telephone Encounter (Signed)
Agree with the sudafed and zyrtec.  If she develops fever, flu-like symptoms then I think she will need COVID testing even though she has been vaccinated Would be reasonable to try treating for a bronchitis if the symptoms persist >> please offer her a z-pack to fill if this doesn;t resolve in the next 2-3 days.

## 2019-08-01 NOTE — Telephone Encounter (Signed)
Pt returning ap hone call. Pt can be reached at [phone].

## 2019-08-01 NOTE — Telephone Encounter (Signed)
Spoke with patient. She stated that she has been coughing more for the past 2-3 days. She has a productive cough with thick, white phlegm. She has noticed a small amount of yellow phlegm at times. Denied any fevers or body aches. She does notice an increase in SOB after a coughing episode.   She has been taking Zyrtec and Sudafed because she figured it was her allergies but has not noticed any relief. She is UTD on her covid vaccines and believes she does not have COVID.    She wants to know if RB has any other recommendations for her.   [facility] is [facility] [facility].   RB, please advise. Thanks!

## 2019-08-02 ENCOUNTER — Telehealth: Payer: Self-pay | Admitting: *Deleted

## 2019-08-02 ENCOUNTER — Ambulatory Visit (INDEPENDENT_AMBULATORY_CARE_PROVIDER_SITE_OTHER): Payer: Medicare Other | Admitting: Pharmacist

## 2019-08-02 ENCOUNTER — Other Ambulatory Visit: Payer: Self-pay

## 2019-08-02 DIAGNOSIS — Z86718 Personal history of other venous thrombosis and embolism: Secondary | ICD-10-CM | POA: Diagnosis not present

## 2019-08-02 DIAGNOSIS — Z7901 Long term (current) use of anticoagulants: Secondary | ICD-10-CM | POA: Diagnosis not present

## 2019-08-02 DIAGNOSIS — D682 Hereditary deficiency of other clotting factors: Secondary | ICD-10-CM

## 2019-08-02 LAB — POCT INR: INR: 3.1 — AB (ref 2.0–3.0)

## 2019-08-02 NOTE — Telephone Encounter (Signed)
Patient is calling to find out if a prescription (antibiotic)had been sent by Dr Elisha Ponder for a sore, red  Toe(right 3rd). She stated that someone had called but did not get message. I explained that a prescription had been sent in by another physician but not from our office.  She wanted to speak with a nurse, please call to confirm.

## 2019-08-02 NOTE — Telephone Encounter (Signed)
Pt states right middle toe was bright red in the shower, called yesterday. Pt states her pulmonologist called in Doxycycline and it would help with her toe too. I told her it should do just fine on the doxycycline, but if she had problems to call our office.

## 2019-08-07 ENCOUNTER — Telehealth: Payer: Self-pay | Admitting: Emergency Medicine

## 2019-08-07 NOTE — Telephone Encounter (Signed)
She needs to be seen in office to trouble shoot. Thanks.

## 2019-08-07 NOTE — Telephone Encounter (Signed)
Spoke with pt. She has been scheduled with [name] tomorrow at 1100. Nothing further was needed.

## 2019-08-07 NOTE — Telephone Encounter (Signed)
We do not have any available in office appointments in the next few days. Can we do a virtual visit?

## 2019-08-07 NOTE — Telephone Encounter (Signed)
Spoke with patient. She stated that she finished the doxy yesterday. She is not feeling any better. She is still coughing up thick phlegm. She is not able to sleep at night, even in a reclined position due to the cough. She denied any increased SOB, fever.   She wants to know what else she can take for the cough so she can finally sleep at night.   [facility] [address].   RB, please advise. Thanks!

## 2019-08-07 NOTE — Telephone Encounter (Signed)
Yes that would work

## 2019-08-08 ENCOUNTER — Other Ambulatory Visit: Payer: Self-pay

## 2019-08-08 ENCOUNTER — Ambulatory Visit (INDEPENDENT_AMBULATORY_CARE_PROVIDER_SITE_OTHER): Payer: Medicare Other | Admitting: Adult Health

## 2019-08-08 ENCOUNTER — Encounter: Payer: Self-pay | Admitting: Adult Health

## 2019-08-08 DIAGNOSIS — R05 Cough: Secondary | ICD-10-CM | POA: Diagnosis not present

## 2019-08-08 DIAGNOSIS — Z7901 Long term (current) use of anticoagulants: Secondary | ICD-10-CM

## 2019-08-08 DIAGNOSIS — J209 Acute bronchitis, unspecified: Secondary | ICD-10-CM | POA: Diagnosis not present

## 2019-08-08 DIAGNOSIS — R059 Cough, unspecified: Secondary | ICD-10-CM

## 2019-08-08 MED ORDER — BENZONATATE 200 MG PO CAPS
200.0000 mg | ORAL_CAPSULE | Freq: Three times a day (TID) | ORAL | 1 refills | Status: DC | PRN
Start: 2019-08-08 — End: 2019-08-20

## 2019-08-08 MED ORDER — PREDNISONE 20 MG PO TABS: 20.0000 mg | ORAL_TABLET | Freq: Every day | ORAL | 0 refills | Status: DC

## 2019-08-08 MED ORDER — AMOXICILLIN-POT CLAVULANATE 875-125 MG PO TABS
1.0000 | ORAL_TABLET | Freq: Two times a day (BID) | ORAL | 0 refills | Status: DC
Start: 2019-08-08 — End: 2019-08-09

## 2019-08-08 NOTE — Progress Notes (Signed)
Virtual Visit via Telephone Note  I connected with Robin Harrell on 08/08/19 at 11:00 AM EDT by telephone and verified that I am speaking with the correct person using two identifiers.  Location: Patient: Home  Provider: Home Office    I discussed the limitations, risks, security and privacy concerns of performing an evaluation and management service by telephone and the availability of in person appointments. I also discussed with the patient that there may be a patient responsible charge related to this service. The patient expressed understanding and agreed to proceed.   History of Present Illness: 86 year old female followed for chronic cough complicated by rhinitis and GERD. Patient had COVID-19 pneumonitis in January 2021  Medical history significant for chronic thromboembolic disease and PE with factor V Leiden deficiency on chronic Coumadin  Today's televisit is an acute office visit.  Patient complains over the last 7 to 10 days she has had increased cough congestion with thick yellow mucus.  Cough is keeping her up at nighttime.  Patient is using her Flonase Zyrtec without much relief.  She uses Chlor-Trimeton intermittently.  Patient was called in doxycycline 1 week ago but says that it had no significant improvement in symptoms.  She denies any hemoptysis, chest pain, fever, body aches, edema. Appetite is good with no nausea vomiting or diarrhea  Patient Active Problem List   Diagnosis Date Noted  . Skin ulcer of toe of right foot, limited to breakdown of skin ([place]) 05/09/2019  . Acute respiratory disease due to COVID-19 virus 02/26/2019  . Pneumonia due to COVID-19 virus 02/26/2019  . Acute respiratory failure due to COVID-19 ([place]) 02/25/2019  . 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 Chronic venous stasis dermatitis of both lower extremities 12/31/2018  . Aortic stenosis, mild 02/07/2017  . History of recurrent UTIs 01/26/2017  . Chronic idiopathic thrombocytopenia  ([place]) 04/14/2016  . Essential tremor 09/03/2015  . History of respiratory failure 11/11/2014  . Hypothyroidism (acquired) 09/02/2014  . Edema of right lower extremity 02/01/2013  . Hyperlipidemia 11/19/2012  . Colon polyp 10/15/2012  . Long term (current) use of anticoagulants 05/10/2012  . Insomnia 10/11/2011  . Osteoarthritis, multiple sites 06/30/2011  . Diastolic dysfunction 26/71/2458  . Factor V deficiency ([place]) 02/09/2010  . Osteoporosis 02/09/2010  . DJD (degenerative joint disease), lumbar 10/14/2009  . Glaucoma 01/29/2008  . Essential hypertension 11/15/2007  . History of DVT (deep vein thrombosis) 11/15/2007  . Seasonal and perennial allergic rhinitis 11/15/2007  . GERD 11/15/2007  . COUGH, CHRONIC 11/15/2007    Current Outpatient Medications on File Prior to Visit  Medication Sig Dispense Refill  . acetaminophen (TYLENOL) 500 MG tablet Take 1,000 mg by mouth every 6 (six) hours as needed for moderate pain.    Marland Kitchen atorvastatin (LIPITOR) 10 MG tablet TAKE 1 TABLET (10 MG TOTAL) BY MOUTH DAILY AT 6 PM. 90 tablet 2  . brimonidine (ALPHAGAN) 0.15 % ophthalmic solution Place 2 drops into both eyes 2 (two) times daily.   3  . Calcium-Phosphorus-Vitamin D (CALCIUM/D3 ADULT GUMMIES) 200-96.6-200 MG-MG-UNIT CHEW Chew 2 tablets by mouth daily.    . cetirizine (ZYRTEC) 10 MG tablet TAKE 1 TABLET BY MOUTH EVERY DAY 90 tablet 3  . chlorpheniramine (CHLOR-TRIMETON) 4 MG tablet Take 4 mg by mouth every morning.     . clotrimazole-betamethasone (LOTRISONE) cream Apply to both feet and between toes bid x 4 weeks. 45 g 0  . fluticasone (FLONASE) 50 MCG/ACT nasal spray SPRAY 2 SPRAYS INTO EACH NOSTRIL EVERY  DAY 48 mL 1  . furosemide (LASIX) 20 MG tablet TAKE 1 TABLET (20 MG TOTAL) BY MOUTH DAILY AS NEEDED FOR EDEMA. (Patient not taking: Reported on 07/01/2019) 30 tablet 5  . ipratropium (ATROVENT) 0.03 % nasal spray PLACE 2 SPRAYS INTO BOTH NOSTRILS 3 (THREE) TIMES DAILY AS NEEDED FOR RHINITIS.  30 mL 6  . latanoprost (XALATAN) 0.005 % ophthalmic solution Place 1 drop into both eyes at bedtime.     Marland Kitchen levothyroxine (SYNTHROID) 25 MCG tablet Take 1 tablet (25 mcg total) by mouth daily before breakfast. 90 tablet 3  . losartan (COZAAR) 50 MG tablet Take 1 tablet (50 mg total) by mouth daily. 90 tablet 1  . metoprolol tartrate (LOPRESSOR) 25 MG tablet TAKE 1 TABLET (25 MG TOTAL) BY MOUTH 2 (TWO) TIMES DAILY. *NEEDS OFFICE VISIT FOR FURTHER REFILLS* 90 tablet 3  . multivitamin-iron-minerals-folic acid (CENTRUM) chewable tablet Chew 1 tablet by mouth daily.    Marland Kitchen triamcinolone cream (KENALOG) 0.1 % APPLY 1 APPLICATION TOPICALLY 2 (TWO) TIMES DAILY. FOR 2 WEEKS, THEN AS NEEDED 60 g 3  . vitamin E (VITAMIN E) 1000 UNIT capsule Take 1,000 Units by mouth daily.    Marland Kitchen warfarin (COUMADIN) 7.5 MG tablet TAKE 1/2 TO 1 TABLET BY MOUTH DAILY AS DIRECTED 90 tablet 1   No current facility-administered medications on file prior to visit.      Observations/Objective: CT chest May 30, 2019 showed subsegmental atelectasis in the bases no pulmonary nodules noted.  Assessment and Plan: Slow to resolve acute tracheobronchitis.-Patient is to begin Augmentin x1 week.  We will give a short steroid burst.  With prednisone 20 mg daily for 4 days.  Add mucolytic's and cough suppressant. Patient will have close follow-up.  Advised if not improving will need to come in the office for a chest x-ray  Plan  Patient Instructions  Augmentin 875mg  Twice daily  For 1 week , take with food.  Prednisone 20mg  daily for 4 days  Mucinex Twice daily  As needed  Cough/congestion  Delsym 2 tsp Twice daily  As needed  Cough/congestion  Tessalon Three times a day  As needed  Cough.  Notify coumadin clinic that you have started an antibiotic  Follow up with Dr. Lamonte Sakai  In 3-4 weeks and As needed   Please contact office for sooner follow up if symptoms do not improve or worsen or seek emergency care       Follow Up  Instructions: Follow-up in 3 to 4 weeks and as needed   I discussed the assessment and treatment plan with the patient. The patient was provided an opportunity to ask questions and all were answered. The patient agreed with the plan and demonstrated an understanding of the instructions.   The patient was advised to call back or seek an in-person evaluation if the symptoms worsen or if the condition fails to improve as anticipated.  I provided 22  minutes of non-face-to-face time during this encounter.   Rexene Edison, NP

## 2019-08-08 NOTE — Patient Instructions (Addendum)
Augmentin 875mg  Twice daily  For 1 week , take with food.  Prednisone 20mg  daily for 4 days  Mucinex Twice daily  As needed  Cough/congestion  Delsym 2 tsp Twice daily  As needed  Cough/congestion  Tessalon Three times a day  As needed  Cough.  Notify coumadin clinic that you have started an antibiotic  Follow up with Dr. Lamonte Sakai  In 3-4 weeks and As needed   Please contact office for sooner follow up if symptoms do not improve or worsen or seek emergency care

## 2019-08-09 ENCOUNTER — Telehealth: Payer: Self-pay | Admitting: Family Medicine

## 2019-08-09 ENCOUNTER — Other Ambulatory Visit: Payer: Self-pay

## 2019-08-09 ENCOUNTER — Telehealth: Payer: Self-pay | Admitting: Adult Health

## 2019-08-09 ENCOUNTER — Encounter: Payer: Self-pay | Admitting: Podiatry

## 2019-08-09 ENCOUNTER — Ambulatory Visit (INDEPENDENT_AMBULATORY_CARE_PROVIDER_SITE_OTHER): Payer: Medicare Other | Admitting: Podiatry

## 2019-08-09 DIAGNOSIS — D6851 Activated protein C resistance: Secondary | ICD-10-CM

## 2019-08-09 DIAGNOSIS — L84 Corns and callosities: Secondary | ICD-10-CM | POA: Diagnosis not present

## 2019-08-09 MED ORDER — AMOXICILLIN-POT CLAVULANATE 400-57 MG/5ML PO SUSR: 800.0000 mg | Freq: Two times a day (BID) | ORAL | 0 refills | Status: DC

## 2019-08-09 MED ORDER — AMOXICILLIN-POT CLAVULANATE 400-57 MG/5ML PO SUSR: 400.0000 mg | Freq: Two times a day (BID) | ORAL | 0 refills | Status: DC

## 2019-08-09 NOTE — Telephone Encounter (Signed)
Medication listed updated

## 2019-08-09 NOTE — Telephone Encounter (Signed)
Order this:   amoxicillin-clavunate 400-57mg /91ml, disp # 166ml, sig take 26ml po bid for 7 days

## 2019-08-09 NOTE — Telephone Encounter (Signed)
Called pt's pharmacy and spoke with Vicente Males in regards to getting pt's augmentin changed to liquid form. She stated that there is not an equivalent for the dose that was prescribed for pt in the augmentin.  When looking at the suspensions for the Augmentin, I see that there is Augmentin 125-31.25mg /46ml, 250-62.5mg /57ml, amoxicillin-clavunate 200-28.5mg /40ml, amoxicillin-clavunate 400-57mg /66ml, and augmentin-ES 600-42.75ml/5ml  Dr. Lamonte Sakai, please advise.

## 2019-08-09 NOTE — Telephone Encounter (Signed)
Called and spoke with pt letting her know that we have sent liquid form of augmentin to pharmacy for her and she verbalized understanding. Also stated to her the info from [place] about mucinex dose and she verbalized understanding. Nothing further needed.

## 2019-08-09 NOTE — Patient Instructions (Signed)
EPSOM SALT FOOT SOAK INSTRUCTIONS  Shopping List:  A. Plain epsom salt (not scented) B. Neosporin Cream/Ointment or Bacitracin Cream/Ointment C. 1-inch fabric band-aids   1.  Place 1/4 cup of epsom salts in 2 quarts of warm tap water. IF YOU ARE DIABETIC, OR HAVE NEUROPATHY, CHECK THE TEMPERATURE OF THE WATER WITH YOUR ELBOW.  2.  Submerge your foot/feet in the solution and soak for 10-15 minutes.      3.  Next, remove your foot or feet from solution, blot dry the affected area.    4.  Apply antibiotic ointment and cover with fabric band-aid .  5.  This soak should be done once a day for 7 days.   6.  Monitor for any signs/symptoms of infection such as redness, swelling, odor, drainage, increased pain, or non-healing of digit.   7.  Please do not hesitate to call the office and speak to a Nurse or Doctor if you have questions.   8.  If you experience fever, chills, nightsweats, nausea or vomiting with worsening of digit, please go to the emergency room.   

## 2019-08-09 NOTE — Telephone Encounter (Signed)
Yes, Ok to change to the equivalent dosing for augmentin liquid.   If she chooses to take mucinex then would recommend plain mucinex (guaifenesin only) 600mg  bid.

## 2019-08-09 NOTE — Progress Notes (Signed)
I agree with the assessment and plans.   Baltazar Apo, MD, PhD 08/09/2019, 3:28 PM [facility] [phone] or if no answer [phone]

## 2019-08-09 NOTE — Telephone Encounter (Signed)
Called and spoke with pt who stated the augmentin abx keeps getting stuck in throat when trying to get it down. She is requesting to have this changed to a liquid form.  Pt also stated that TP mentioned her taking mucinex but she didn't say which kind. Dr. Lamonte Sakai, please advise on all this for pt. Thanks!

## 2019-08-09 NOTE — Telephone Encounter (Signed)
Patient called in and wanted to update her medication list and she also stated the her caretaker her brought her a medication and wants to see if she be able to take it. Patient would like a call back at [phone].

## 2019-08-12 ENCOUNTER — Ambulatory Visit (INDEPENDENT_AMBULATORY_CARE_PROVIDER_SITE_OTHER): Payer: Medicare Other | Admitting: Physician Assistant

## 2019-08-12 ENCOUNTER — Other Ambulatory Visit: Payer: Self-pay

## 2019-08-12 ENCOUNTER — Encounter: Payer: Self-pay | Admitting: Physician Assistant

## 2019-08-12 VITALS — BP 110/60 | HR 70 | Temp 97.1°F | Ht 62.5 in | Wt 131.5 lb

## 2019-08-12 DIAGNOSIS — M533 Sacrococcygeal disorders, not elsewhere classified: Secondary | ICD-10-CM | POA: Diagnosis not present

## 2019-08-12 MED ORDER — DICLOFENAC SODIUM 1 % EX GEL: 2.0000 g | Freq: Four times a day (QID) | CUTANEOUS | 1 refills | Status: DC

## 2019-08-12 NOTE — Patient Instructions (Addendum)
It was great to see you!  Start using topical voltaren gel -- this is also available over the counter if your insurance does not approve this medication. May apply to the affected area 2-4 times daily. Continue using better support when you sit.  You can walk in at [facility] without a scheduled appt for your xray.  The address is [address].  X-ray is located in the basement. Hours of operation are M-F 8:30am to 5:00pm. Closed for lunch between 12:30 and 1:00pm.  I will be in touch with your xray results.  Take care,  Inda Coke [name]

## 2019-08-12 NOTE — Progress Notes (Signed)
Robin Harrell is a 86 y.o. female is here for follow up.  I acted as a Education administrator for [employer], [name] Anselmo Pickler, LPN   History of Present Illness:   Chief Complaint  Patient presents with  . Tailbone Pain    HPI   Coccyx pain Fell at [facility] on a tile floor when she was using the bathroom, she was there about 6 months ago after being hospitalized for COVID-19. Did not have any work-up for this.   Has ongoing coccyx pain -- last week the pain was so severe it was causing her to not be able to sleep. Pain has worsened in the past two weeks. Pain has been as high as 10/10. She doesn't know what has caused increased symptoms. Tries to sit in her recliner to take the pressure off her sacrum.  Has not taking anything for pain. Hx of osteoporosis.  Denies: unintentional weight loss, new/unusual bowel/bladder incontinence, fever, chills, numbness/tingling, radiation of pain  Wt Readings from Last 4 Encounters:  08/12/19 131 lb 8 oz (59.6 kg)  05/16/19 124 lb 9.6 oz (56.5 kg)  05/09/19 127 lb 6.4 oz (57.8 kg)  04/16/19 127 lb (57.6 kg)      Health Maintenance Due  Topic Date Due  . COVID-19 Vaccine (1) Never done    Past Medical History:  Diagnosis Date  . Allergic rhinitis   . Cough   . Difficulty in swallowing    w/ occasional aspiration  . DVT (deep venous thrombosis) ([place])   . Dyslipidemia   . Factor V Leiden deficiency    lifelong coumadin  . GERD (gastroesophageal reflux disease)   . History of nuclear stress test 12/28/2007   lexiscan; low risk   . HTN (hypertension)   . PND (post-nasal drip)   . Pulmonary embolism ([place])   . PVC's (premature ventricular contractions)   . Thyroid disease      Social History   Tobacco Use  . Smoking status: Former Smoker    Years: 1.00    Types: Cigarettes    Quit date: 02/21/1958    Years since quitting: 61.5  . Smokeless tobacco: Never Used  Vaping Use  . Vaping Use: Never used  Substance Use Topics    . Alcohol use: Yes    Comment: rareley one glass of wine  . Drug use: No    Past Surgical History:  Procedure Laterality Date  . APPENDECTOMY    . BUNIONECTOMY    . CATARACT EXTRACTION W/ INTRAOCULAR LENS IMPLANT Right 11/02/2013  . CHOLECYSTECTOMY  1973  . ESOPHAGEAL DILATION    . HAMMER TOE SURGERY    . history of sleep study  12/27/2007   AHI during total sleep 0.9/hr and REM 1.5/hr; RDI during total sleep6.0/hr and REM 6.1/hr  . 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 TRANSTHORACIC ECHOCARDIOGRAM  12/28/2007   borderline conc LVH with normal systolic function; MV mod thickened with mild MVP & mild MR    Family History  Problem Relation Age of Onset  . Lung cancer Father   . Cirrhosis Father   . Coronary artery disease Maternal Grandfather        MI  . Throat cancer Maternal Grandfather   . Heart disease Paternal Grandmother   . Coronary artery disease Paternal Grandfather        MI  . Throat cancer Paternal Grandfather   . Allergic rhinitis Sister   . Skin cancer Brother   .  Breast cancer Daughter 81    PMHx, SurgHx, SocialHx, FamHx, Medications, and Allergies were reviewed in the Visit Navigator and updated as appropriate.   Patient Active Problem List   Diagnosis Date Noted  . Skin ulcer of toe of right foot, limited to breakdown of skin ([place]) 05/09/2019  . Acute respiratory disease due to COVID-19 virus 02/26/2019  . Pneumonia due to COVID-19 virus 02/26/2019  . Acute respiratory failure due to COVID-19 ([place]) 02/25/2019  . 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 Chronic venous stasis dermatitis of both lower extremities 12/31/2018  . Aortic stenosis, mild 02/07/2017  . History of recurrent UTIs 01/26/2017  . Chronic idiopathic thrombocytopenia ([place]) 04/14/2016  . Essential tremor 09/03/2015  . History of respiratory failure 11/11/2014  . Hypothyroidism (acquired) 09/02/2014  . Edema of right lower extremity 02/01/2013  . Hyperlipidemia 11/19/2012   . Colon polyp 10/15/2012  . Long term (current) use of anticoagulants 05/10/2012  . Insomnia 10/11/2011  . Osteoarthritis, multiple sites 06/30/2011  . Diastolic dysfunction 02/54/2706  . Factor V deficiency ([place]) 02/09/2010  . Osteoporosis 02/09/2010  . DJD (degenerative joint disease), lumbar 10/14/2009  . Glaucoma 01/29/2008  . Essential hypertension 11/15/2007  . History of DVT (deep vein thrombosis) 11/15/2007  . Seasonal and perennial allergic rhinitis 11/15/2007  . GERD 11/15/2007  . COUGH, CHRONIC 11/15/2007    Social History   Tobacco Use  . Smoking status: Former Smoker    Years: 1.00    Types: Cigarettes    Quit date: 02/21/1958    Years since quitting: 61.5  . Smokeless tobacco: Never Used  Vaping Use  . Vaping Use: Never used  Substance Use Topics  . Alcohol use: Yes    Comment: rareley one glass of wine  . Drug use: No    Current Medications and Allergies:    Current Outpatient Medications:  .  acetaminophen (TYLENOL) 500 MG tablet, Take 1,000 mg by mouth every 6 (six) hours as needed for moderate pain., Disp: , Rfl:  .  amoxicillin-clavulanate (AUGMENTIN) 400-57 MG/5ML suspension, Take 10 mLs (800 mg total) by mouth 2 (two) times daily., Disp: 140 mL, Rfl: 0 .  atorvastatin (LIPITOR) 10 MG tablet, TAKE 1 TABLET (10 MG TOTAL) BY MOUTH DAILY AT 6 PM., Disp: 90 tablet, Rfl: 2 .  benzonatate (TESSALON) 200 MG capsule, Take 1 capsule (200 mg total) by mouth 3 (three) times daily as needed for cough., Disp: 30 capsule, Rfl: 1 .  brimonidine (ALPHAGAN) 0.15 % ophthalmic solution, Place 2 drops into both eyes 2 (two) times daily. , Disp: , Rfl: 3 .  Calcium-Phosphorus-Vitamin D (CALCIUM/D3 ADULT GUMMIES) 200-96.6-200 MG-MG-UNIT CHEW, Chew 2 tablets by mouth daily., Disp: , Rfl:  .  cetirizine (ZYRTEC) 10 MG tablet, TAKE 1 TABLET BY MOUTH EVERY DAY, Disp: 90 tablet, Rfl: 3 .  chlorpheniramine (CHLOR-TRIMETON) 4 MG tablet, Take 4 mg by mouth every morning. , Disp: ,  Rfl:  .  clotrimazole-betamethasone (LOTRISONE) cream, Apply to both feet and between toes bid x 4 weeks., Disp: 45 g, Rfl: 0 .  fluticasone (FLONASE) 50 MCG/ACT nasal spray, SPRAY 2 SPRAYS INTO EACH NOSTRIL EVERY DAY, Disp: 48 mL, Rfl: 1 .  guaiFENesin (MUCINEX) 600 MG 12 hr tablet, Take by mouth 2 (two) times daily., Disp: , Rfl:  .  ipratropium (ATROVENT) 0.03 % nasal spray, PLACE 2 SPRAYS INTO BOTH NOSTRILS 3 (THREE) TIMES DAILY AS NEEDED FOR RHINITIS., Disp: 30 mL, Rfl: 6 .  latanoprost (XALATAN) 0.005 % ophthalmic solution,  Place 1 drop into both eyes at bedtime. , Disp: , Rfl:  .  levothyroxine (SYNTHROID) 25 MCG tablet, Take 1 tablet (25 mcg total) by mouth daily before breakfast., Disp: 90 tablet, Rfl: 3 .  losartan (COZAAR) 50 MG tablet, Take 1 tablet (50 mg total) by mouth daily., Disp: 90 tablet, Rfl: 1 .  metoprolol tartrate (LOPRESSOR) 25 MG tablet, TAKE 1 TABLET (25 MG TOTAL) BY MOUTH 2 (TWO) TIMES DAILY. *NEEDS OFFICE VISIT FOR FURTHER REFILLS*, Disp: 90 tablet, Rfl: 3 .  multivitamin-iron-minerals-folic acid (CENTRUM) chewable tablet, Chew 1 tablet by mouth daily., Disp: , Rfl:  .  triamcinolone cream (KENALOG) 0.1 %, APPLY 1 APPLICATION TOPICALLY 2 (TWO) TIMES DAILY. FOR 2 WEEKS, THEN AS NEEDED, Disp: 60 g, Rfl: 3 .  vitamin E (VITAMIN E) 1000 UNIT capsule, Take 1,000 Units by mouth daily., Disp: , Rfl:  .  warfarin (COUMADIN) 7.5 MG tablet, TAKE 1/2 TO 1 TABLET BY MOUTH DAILY AS DIRECTED, Disp: 90 tablet, Rfl: 1 .  diclofenac Sodium (VOLTAREN) 1 % GEL, Apply 2 g topically 4 (four) times daily., Disp: 50 g, Rfl: 1   Allergies  Allergen Reactions  . Azithromycin Nausea Only  . Codeine Nausea Only  . Erythromycin Nausea And Vomiting    Review of Systems   ROS  Negative unless otherwise specified per HPI.  Vitals:   Vitals:   08/12/19 1320  BP: 110/60  Pulse: 70  Temp: (!) 97.1 F (36.2 C)  TempSrc: Temporal  SpO2: 95%  Weight: 131 lb 8 oz (59.6 kg)  Height: 5'  2.5" (1.588 m)     Body mass index is 23.67 kg/m.   Physical Exam:    Physical Exam Vitals and nursing note reviewed.  Constitutional:      General: She is not in acute distress.    Appearance: She is well-developed. She is not ill-appearing or toxic-appearing.  Cardiovascular:     Rate and Rhythm: Normal rate and regular rhythm.     Pulses: Normal pulses.     Heart sounds: S1 normal and S2 normal. Murmur heard.  Systolic murmur is present.      Comments: No LE edema Pulmonary:     Effort: Pulmonary effort is normal.     Breath sounds: Normal breath sounds.  Musculoskeletal:     Comments: No visible abnormalities to coccyx -- no swelling, erythema, or skin breakdown No TTP  Skin:    General: Skin is warm and dry.  Neurological:     Mental Status: She is alert.     GCS: GCS eye subscore is 4. GCS verbal subscore is 5. GCS motor subscore is 6.  Psychiatric:        Speech: Speech normal.        Behavior: Behavior normal. Behavior is cooperative.      Assessment and Plan:    Robin Harrell was seen today for tailbone pain.  Diagnoses and all orders for this visit:  Coccyx pain Given recent resurgence of pain without known cause and hx of osteoporosis, will order coccyx xray. Also will trial topical voltaren gel to area when she has symptoms. Will consider referral to sports medicine if indicated on xray or if lack of improvement. -     DG Sacrum/Coccyx; Future  Other orders -     diclofenac Sodium (VOLTAREN) 1 % GEL; Apply 2 g topically 4 (four) times daily.   . Reviewed expectations re: course of current medical issues. . Discussed self-management of symptoms. . Outlined  signs and symptoms indicating need for more acute intervention. . Patient verbalized understanding and all questions were answered. . See orders for this visit as documented in the electronic medical record. . Patient received an After Visit Summary.  CMA or LPN served as scribe during this visit.  History, Physical, and Plan performed by medical provider. The above documentation has been reviewed and is accurate and complete.   Inda Coke, [name] [place], [place] 08/12/2019  Follow-up: No follow-ups on file.

## 2019-08-13 ENCOUNTER — Ambulatory Visit (INDEPENDENT_AMBULATORY_CARE_PROVIDER_SITE_OTHER)
Admission: RE | Admit: 2019-08-13 | Discharge: 2019-08-13 | Disposition: A | Discharge status: Home / Self Care | Payer: Medicare Other | Source: Ambulatory Visit | Attending: Physician Assistant | Admitting: Physician Assistant

## 2019-08-13 DIAGNOSIS — M533 Sacrococcygeal disorders, not elsewhere classified: Secondary | ICD-10-CM | POA: Diagnosis not present

## 2019-08-14 ENCOUNTER — Telehealth: Payer: Self-pay | Admitting: Emergency Medicine

## 2019-08-14 ENCOUNTER — Other Ambulatory Visit: Payer: Self-pay

## 2019-08-14 ENCOUNTER — Ambulatory Visit (INDEPENDENT_AMBULATORY_CARE_PROVIDER_SITE_OTHER): Payer: Medicare Other | Admitting: Pharmacist

## 2019-08-14 DIAGNOSIS — Z7901 Long term (current) use of anticoagulants: Secondary | ICD-10-CM | POA: Diagnosis not present

## 2019-08-14 LAB — POCT INR: INR: 2.3 (ref 2.0–3.0)

## 2019-08-14 NOTE — Telephone Encounter (Signed)
Called and spoke with pt who said that she is still coughing after taking the liquid augmentin and benzonate. Pt states she is coughing up a lot of clear phlegm but stated she had one time that there was some green tinge to it.  Pt denies any complaints of wheezing. Pt denies any complaints of worsening SOB.  Pt said that she has tried to sleep in her chair so she will be sitting up right but stated that has not been comfortable for her as she likes to sleep on her side.  Pt wants to know if there is anything else that can be recommended to help with her symptoms. Dr Lamonte Sakai, please advise.

## 2019-08-14 NOTE — Telephone Encounter (Signed)
Agree that she needs to be seen with a CXR same day. She is going to see B Warner Mccreedy on Monday

## 2019-08-17 NOTE — Progress Notes (Signed)
Subjective: Robin Harrell is a pleasant 86 y.o. female patient seen today follow up painful lesion distal tip of right 3rd digit.   She states toe was red and painful and aggravated when ambulating. She was given antibiotics for bronchitis by her Pulmonologist which has helped. She denies any drainage from digit.   Past Medical History:  Diagnosis Date  . Allergic rhinitis   . Cough   . Difficulty in swallowing    w/ occasional aspiration  . DVT (deep venous thrombosis) ([place])   . Dyslipidemia   . Factor V Leiden deficiency    lifelong coumadin  . GERD (gastroesophageal reflux disease)   . History of nuclear stress test 12/28/2007   lexiscan; low risk   . HTN (hypertension)   . PND (post-nasal drip)   . Pulmonary embolism ([place])   . PVC's (premature ventricular contractions)   . Thyroid disease     Patient Active Problem List   Diagnosis Date Noted  . Skin ulcer of toe of right foot, limited to breakdown of skin ([place]) 05/09/2019  . Acute respiratory disease due to COVID-19 virus 02/26/2019  . Pneumonia due to COVID-19 virus 02/26/2019  . Acute respiratory failure due to COVID-19 ([place]) 02/25/2019  . 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 Chronic venous stasis dermatitis of both lower extremities 12/31/2018  . Aortic stenosis, mild 02/07/2017  . History of recurrent UTIs 01/26/2017  . Chronic idiopathic thrombocytopenia ([place]) 04/14/2016  . Essential tremor 09/03/2015  . History of respiratory failure 11/11/2014  . Hypothyroidism (acquired) 09/02/2014  . Edema of right lower extremity 02/01/2013  . Hyperlipidemia 11/19/2012  . Colon polyp 10/15/2012  . Long term (current) use of anticoagulants 05/10/2012  . Insomnia 10/11/2011  . Osteoarthritis, multiple sites 06/30/2011  . Diastolic dysfunction 81/19/1478  . Factor V deficiency ([place]) 02/09/2010  . Osteoporosis 02/09/2010  . DJD (degenerative joint disease), lumbar 10/14/2009  . Glaucoma 01/29/2008  . Essential  hypertension 11/15/2007  . History of DVT (deep vein thrombosis) 11/15/2007  . Seasonal and perennial allergic rhinitis 11/15/2007  . GERD 11/15/2007  . COUGH, CHRONIC 11/15/2007    Current Outpatient Medications on File Prior to Visit  Medication Sig Dispense Refill  . acetaminophen (TYLENOL) 500 MG tablet Take 1,000 mg by mouth every 6 (six) hours as needed for moderate pain.    Marland Kitchen atorvastatin (LIPITOR) 10 MG tablet TAKE 1 TABLET (10 MG TOTAL) BY MOUTH DAILY AT 6 PM. 90 tablet 2  . benzonatate (TESSALON) 200 MG capsule Take 1 capsule (200 mg total) by mouth 3 (three) times daily as needed for cough. 30 capsule 1  . brimonidine (ALPHAGAN) 0.15 % ophthalmic solution Place 2 drops into both eyes 2 (two) times daily.   3  . Calcium-Phosphorus-Vitamin D (CALCIUM/D3 ADULT GUMMIES) 200-96.6-200 MG-MG-UNIT CHEW Chew 2 tablets by mouth daily.    . cetirizine (ZYRTEC) 10 MG tablet TAKE 1 TABLET BY MOUTH EVERY DAY 90 tablet 3  . chlorpheniramine (CHLOR-TRIMETON) 4 MG tablet Take 4 mg by mouth every morning.     . clotrimazole-betamethasone (LOTRISONE) cream Apply to both feet and between toes bid x 4 weeks. 45 g 0  . fluticasone (FLONASE) 50 MCG/ACT nasal spray SPRAY 2 SPRAYS INTO EACH NOSTRIL EVERY DAY 48 mL 1  . ipratropium (ATROVENT) 0.03 % nasal spray PLACE 2 SPRAYS INTO BOTH NOSTRILS 3 (THREE) TIMES DAILY AS NEEDED FOR RHINITIS. 30 mL 6  . latanoprost (XALATAN) 0.005 % ophthalmic solution Place 1 drop into both eyes at  bedtime.     Marland Kitchen levothyroxine (SYNTHROID) 25 MCG tablet Take 1 tablet (25 mcg total) by mouth daily before breakfast. 90 tablet 3  . losartan (COZAAR) 50 MG tablet Take 1 tablet (50 mg total) by mouth daily. 90 tablet 1  . metoprolol tartrate (LOPRESSOR) 25 MG tablet TAKE 1 TABLET (25 MG TOTAL) BY MOUTH 2 (TWO) TIMES DAILY. *NEEDS OFFICE VISIT FOR FURTHER REFILLS* 90 tablet 3  . multivitamin-iron-minerals-folic acid (CENTRUM) chewable tablet Chew 1 tablet by mouth daily.    Marland Kitchen  triamcinolone cream (KENALOG) 0.1 % APPLY 1 APPLICATION TOPICALLY 2 (TWO) TIMES DAILY. FOR 2 WEEKS, THEN AS NEEDED 60 g 3  . vitamin E (VITAMIN E) 1000 UNIT capsule Take 1,000 Units by mouth daily.    Marland Kitchen warfarin (COUMADIN) 7.5 MG tablet TAKE 1/2 TO 1 TABLET BY MOUTH DAILY AS DIRECTED 90 tablet 1   No current facility-administered medications on file prior to visit.    Allergies  Allergen Reactions  . Azithromycin Nausea Only  . Codeine Nausea Only  . Erythromycin Nausea And Vomiting  Objective: Physical Exam  General: Robin Harrell is a pleasant 86 y.o. Caucasian female, WD, WN in NAD. AAO x 3.   Vascular:  Neurovascular status unchanged b/l lower extremities. Capillary fill time to digits <3 seconds b/l lower extremities. Palpable pedal pulses b/l LE. Pedal hair sparse. Lower extremity skin temperature gradient within normal limits.  Dermatological:  Pedal skin is thin shiny, atrophic b/l lower extremities. No open wounds bilaterally. No interdigital macerations bilaterally. Devitalized hyperkeratotic lesion(s) R 3rd toe with subdermal hemorrhage measuring 1.0 x 1.0 cm predebridement.  No erythema, no edema, no drainage, no flocculence. Postdebridement, reveals no open wound, but it preulcerative with tenderness to palpation.  Musculoskeletal:  Normal muscle strength 5/5 to all lower extremity muscle groups bilaterally. No pain crepitus or joint limitation noted with ROM b/l. Hammertoes noted to the 2-5 bilaterally.  Neurological:  Protective sensation intact 5/5 intact bilaterally with 10g monofilament b/l. Vibratory sensation intact b/l. Proprioception intact bilaterally.  Assessment and Plan:  1. Pre-ulcerative corn or callous   2. Factor V Leiden ([place])   -Examined patient. -Patient to report any pedal injuries to medical professional immediately. -Painful preulcerative lesion pared distal tip right 3rd digit. Patient noted relief of symptoms. Continue digital protection  daily with Silipos toe cap. Apply every morning. Remove every evening. -Patient to continue soft, supportive shoe gear daily. -Patient/POA to call should there be question/concern in the interim.  Return in about 2 weeks (around 08/23/2019) for right nail check.  Marzetta Board, DPM

## 2019-08-19 ENCOUNTER — Other Ambulatory Visit: Payer: Self-pay

## 2019-08-19 ENCOUNTER — Ambulatory Visit (INDEPENDENT_AMBULATORY_CARE_PROVIDER_SITE_OTHER): Payer: Medicare Other | Admitting: Pulmonary Disease

## 2019-08-19 ENCOUNTER — Encounter: Payer: Self-pay | Admitting: Pulmonary Disease

## 2019-08-19 ENCOUNTER — Other Ambulatory Visit: Payer: Self-pay | Admitting: *Deleted

## 2019-08-19 VITALS — BP 110/68 | HR 78 | Temp 97.8°F | Ht 63.0 in | Wt 126.8 lb

## 2019-08-19 DIAGNOSIS — J302 Other seasonal allergic rhinitis: Secondary | ICD-10-CM | POA: Diagnosis not present

## 2019-08-19 DIAGNOSIS — U071 COVID-19: Secondary | ICD-10-CM | POA: Diagnosis not present

## 2019-08-19 DIAGNOSIS — J3089 Other allergic rhinitis: Secondary | ICD-10-CM

## 2019-08-19 DIAGNOSIS — R05 Cough: Secondary | ICD-10-CM | POA: Diagnosis not present

## 2019-08-19 DIAGNOSIS — R059 Cough, unspecified: Secondary | ICD-10-CM

## 2019-08-19 DIAGNOSIS — J1282 Pneumonia due to coronavirus disease 2019: Secondary | ICD-10-CM

## 2019-08-19 DIAGNOSIS — M533 Sacrococcygeal disorders, not elsewhere classified: Secondary | ICD-10-CM

## 2019-08-19 NOTE — Progress Notes (Signed)
@Patient  ID: Robin Harrell, female    DOB: 1933-06-14, 86 y.o.   MRN: [id number]  Chief Complaint  Patient presents with   Follow-up    pt is coughing up clear mucus.pt coughs mostly at night    Referring provider: [name], MD  HPI:  86 year old female former smoker followed in our office for COPD, chronic rhinitis, chronic cough  Past medical history: History of factor V (managed on chronic Coumadin), history of PE, GERD, hiatal hernia Smoking history: Former smoker. Quit 1960. 1 pack year.  Maintenance: None  Patient of Dr. Lamonte Sakai  08/19/2019  - Visit   86 year old female former smoker followed in our office for COPD, chronic rhinitis, chronic cough.  She is followed by Dr. Lamonte Sakai.  She is previously treated with Augmentin.  She has not found any improvement since taking liquid Augmentin.  She is also status post COVID-19 infection in January/2021.  Patient presenting today with her daughter.  She is reporting she is having multiple episodes coughing up clear mucus.  She was treated with Augmentin as well as a course of prednisone with no clinical improvement.  We will review this today.  Patient with significant history of chronic rhinitis.  Patient reports adherence to cetirizine, she is taking chlorpheniramine in the morning, she is taking Flonase twice daily, using nasal saline rinses occasionally, unfortunately she is not using Atrovent nasal spray.  Of note, patient reporting today that she had some dizziness and lightheadedness when having a bowel movement.  She has not followed up with primary care regarding this.  Questionaires / Pulmonary Flowsheets:   ACT:  No flowsheet data found.  MMRC: No flowsheet data found.  Epworth:  No flowsheet data found.  Tests:   02/25/2019-SARS-CoV-2-positive  02/25/2019-CTA chest-no evidence of PE, diffuse bilateral groundglass airspace opacities, small right-sided pleural effusion, enlarged main pulmonary artery which  can be seen in pulmonary arterial hypertension  CT chest May 30, 2019 showed subsegmental atelectasis in the bases no pulmonary nodules noted.   FENO:  No results found for: NITRICOXIDE  PFT: No flowsheet data found.  WALK:  No flowsheet data found.  Imaging: DG Sacrum/Coccyx  Result Date: 08/15/2019 CLINICAL DATA:  Fall 6 months ago, persistent pain, osteoporosis EXAM: SACRUM AND COCCYX - 2+ VIEW COMPARISON:  08/10/2018 FINDINGS: Osteopenia, which significantly limits radiographic evaluation of the sacrum, coccyx, and pelvis. No obvious displaced fracture of the sacrum. There is angulation of the distal coccyx. Severe pubic symphysis arthrosis, unchanged compared to prior examination dated 08/10/2018. Partially imaged dextroscoliosis and disc degenerative disease of the included lower lumbar spine. IMPRESSION: 1. Osteopenia, which significantly limits radiographic evaluation of the sacrum, coccyx, and pelvis. 2. No obvious displaced fracture of the sacrum. There is angulation of the distal coccyx, which may reflect fracture or developmental variant, and cannot be compared to frontal only radiographs dated 08/10/2018. 3. Severe pubic symphysis arthrosis, unchanged compared to prior examination dated 08/10/2018. Electronically Signed   By: Eddie Candle M.D.   On: 08/15/2019 09:03    Lab Results:  CBC    Component Value Date/Time   WBC 3.6 (L) 05/16/2019 1105   RBC 3.60 (L) 05/16/2019 1105   HGB 11.2 (L) 05/16/2019 1105   HCT 33.7 (L) 05/16/2019 1105   PLT 120.0 (L) 05/16/2019 1105   MCV 93.8 05/16/2019 1105   MCH 31.0 03/02/2019 0039   MCHC 33.2 05/16/2019 1105   RDW 14.2 05/16/2019 1105   LYMPHSABS 0.8 05/16/2019 1105   MONOABS 0.5  05/16/2019 1105   EOSABS 0.1 05/16/2019 1105   BASOSABS 0.0 05/16/2019 1105    BMET    Component Value Date/Time   NA 137 05/16/2019 1105   K 4.2 05/16/2019 1105   CL 103 05/16/2019 1105   CO2 29 05/16/2019 1105   GLUCOSE 102 (H) 05/16/2019  1105   BUN 20 05/16/2019 1105   CREATININE 0.99 05/16/2019 1105   CALCIUM 9.0 05/16/2019 1105   GFRNONAA >60 03/02/2019 0039   GFRAA >60 03/02/2019 0039    BNP    Component Value Date/Time   BNP 263.5 (H) 03/02/2019 0039    ProBNP No results found for: PROBNP  Specialty Problems      Pulmonary Problems   COUGH, CHRONIC   Seasonal and perennial allergic rhinitis    Qualifier: Diagnosis of  By: Ronnald Ramp CNA/MA, Jessica        Acute respiratory failure due to COVID-19 [facility])   Acute respiratory disease due to COVID-19 virus   Pneumonia due to COVID-19 virus    02/25/2019-SARS-CoV-2-positive Hospitlized in [name]/2021 - [facility] -received IV steroids and remdesivir, did not appear to require mechanical ventilation         Allergies  Allergen Reactions   Azithromycin Nausea Only   Codeine Nausea Only   Erythromycin Nausea And Vomiting    Immunization History  Administered Date(s) Administered   Fluad Quad(high Dose 65+) 10/17/2018   Influenza Split 11/21/2008, 02/09/2010, 10/24/2011, 11/21/2012   Influenza Whole 11/27/2008   Influenza, High Dose Seasonal PF 11/12/2011, 11/29/2012, 11/05/2013, 01/05/2015, 11/16/2015, 10/21/2016, 11/09/2017   Influenza,inj,Quad PF,6+ Mos 11/23/2015   Influenza-Unspecified 02/02/2011   PFIZER SARS-COV-2 Vaccination 06/03/2019, 06/17/2019   Pneumococcal Conjugate-13 04/23/2013   Pneumococcal Polysaccharide-23 02/21/2005   Tdap 09/22/2007   Zoster 04/22/2010   Zoster Recombinat (Shingrix) 07/11/2016, 01/27/2017    Past Medical History:  Diagnosis Date   Allergic rhinitis    Cough    Difficulty in swallowing    w/ occasional aspiration   DVT (deep venous thrombosis) (HCC)    Dyslipidemia    Factor V Leiden deficiency    lifelong coumadin   GERD (gastroesophageal reflux disease)    History of nuclear stress test 12/28/2007   lexiscan; low risk    HTN (hypertension)    PND (post-nasal drip)    Pulmonary  embolism (HCC)    PVC's (premature ventricular contractions)    Thyroid disease     Tobacco History: Social History   Tobacco Use  Smoking Status Former Smoker   Years: 1.00   Types: Cigarettes   Quit date: 02/21/1958   Years since quitting: 61.5  Smokeless Tobacco Never Used   Counseling given: Not Answered   Continue to not smoke  Outpatient Encounter Medications as of 08/19/2019  Medication Sig   acetaminophen (TYLENOL) 500 MG tablet Take 1,000 mg by mouth every 6 (six) hours as needed for moderate pain.   amoxicillin-clavulanate (AUGMENTIN) 400-57 MG/5ML suspension Take 10 mLs (800 mg total) by mouth 2 (two) times daily.   atorvastatin (LIPITOR) 10 MG tablet TAKE 1 TABLET (10 MG TOTAL) BY MOUTH DAILY AT 6 PM.   benzonatate (TESSALON) 200 MG capsule Take 1 capsule (200 mg total) by mouth 3 (three) times daily as needed for cough.   brimonidine (ALPHAGAN) 0.15 % ophthalmic solution Place 2 drops into both eyes 2 (two) times daily.    Calcium-Phosphorus-Vitamin D (CALCIUM/D3 ADULT GUMMIES) 200-96.6-200 MG-MG-UNIT CHEW Chew 2 tablets by mouth daily.   cetirizine (ZYRTEC) 10 MG tablet TAKE 1 TABLET  BY MOUTH EVERY DAY   chlorpheniramine (CHLOR-TRIMETON) 4 MG tablet Take 4 mg by mouth every morning.    clotrimazole-betamethasone (LOTRISONE) cream Apply to both feet and between toes bid x 4 weeks.   diclofenac Sodium (VOLTAREN) 1 % GEL Apply 2 g topically 4 (four) times daily.   fluticasone (FLONASE) 50 MCG/ACT nasal spray SPRAY 2 SPRAYS INTO EACH NOSTRIL EVERY DAY   ipratropium (ATROVENT) 0.03 % nasal spray PLACE 2 SPRAYS INTO BOTH NOSTRILS 3 (THREE) TIMES DAILY AS NEEDED FOR RHINITIS.   latanoprost (XALATAN) 0.005 % ophthalmic solution Place 1 drop into both eyes at bedtime.    levothyroxine (SYNTHROID) 25 MCG tablet Take 1 tablet (25 mcg total) by mouth daily before breakfast.   losartan (COZAAR) 50 MG tablet Take 1 tablet (50 mg total) by mouth daily.    metoprolol tartrate (LOPRESSOR) 25 MG tablet TAKE 1 TABLET (25 MG TOTAL) BY MOUTH 2 (TWO) TIMES DAILY. *NEEDS OFFICE VISIT FOR FURTHER REFILLS*   multivitamin-iron-minerals-folic acid (CENTRUM) chewable tablet Chew 1 tablet by mouth daily.   vitamin E (VITAMIN E) 1000 UNIT capsule Take 1,000 Units by mouth daily.   warfarin (COUMADIN) 7.5 MG tablet TAKE 1/2 TO 1 TABLET BY MOUTH DAILY AS DIRECTED   [DISCONTINUED] triamcinolone cream (KENALOG) 0.1 % APPLY 1 APPLICATION TOPICALLY 2 (TWO) TIMES DAILY. FOR 2 WEEKS, THEN AS NEEDED   [DISCONTINUED] guaiFENesin (MUCINEX) 600 MG 12 hr tablet Take by mouth 2 (two) times daily.   No facility-administered encounter medications on file as of 08/19/2019.     Review of Systems  Review of Systems  Constitutional: Positive for fatigue. Negative for activity change and fever.  HENT: Positive for congestion, nosebleeds, postnasal drip and rhinorrhea. Negative for sinus pressure, sinus pain and sore throat.   Respiratory: Positive for cough and wheezing (? potentially ). Negative for shortness of breath.   Cardiovascular: Negative for chest pain and palpitations.  Gastrointestinal: Negative for diarrhea, nausea and vomiting.  Musculoskeletal: Negative for arthralgias.  Neurological: Negative for dizziness.  Psychiatric/Behavioral: Negative for sleep disturbance. The patient is not nervous/anxious.      Physical Exam  BP 110/68 (BP Location: Left Arm, Cuff Size: Normal)    Pulse 78    Temp 97.8 F (36.6 C) (Oral)    Ht 5\' 3"  (1.6 m)    Wt 126 lb 12.8 oz (57.5 kg)    LMP  (LMP Unknown)    SpO2 98%    BMI 22.46 kg/m   Wt Readings from Last 5 Encounters:  08/19/19 126 lb 12.8 oz (57.5 kg)  08/12/19 131 lb 8 oz (59.6 kg)  05/16/19 124 lb 9.6 oz (56.5 kg)  05/09/19 127 lb 6.4 oz (57.8 kg)  04/16/19 127 lb (57.6 kg)    BMI Readings from Last 5 Encounters:  08/19/19 22.46 kg/m  08/12/19 23.67 kg/m  05/16/19 22.35 kg/m  05/09/19 22.93 kg/m    04/16/19 24.80 kg/m     Physical Exam Vitals and nursing note reviewed.  Constitutional:      General: She is not in acute distress.    Appearance: Normal appearance.     Comments: Plan: Continue frail elderly female  HENT:     Head: Normocephalic and atraumatic.     Right Ear: Tympanic membrane, ear canal and external ear normal. There is no impacted cerumen.     Left Ear: Tympanic membrane, ear canal and external ear normal. There is no impacted cerumen.     Nose: Rhinorrhea present. No congestion.  Mouth/Throat:     Mouth: Mucous membranes are moist.     Pharynx: Oropharynx is clear.     Comments: Significant postnasal drip Eyes:     Pupils: Pupils are equal, round, and reactive to light.  Cardiovascular:     Rate and Rhythm: Normal rate and regular rhythm.     Pulses: Normal pulses.     Heart sounds: Normal heart sounds. No murmur heard.   Pulmonary:     Effort: Pulmonary effort is normal. No respiratory distress.     Breath sounds: Normal breath sounds. No decreased air movement. No decreased breath sounds, wheezing or rales.  Musculoskeletal:     Cervical back: Normal range of motion.     Right lower leg: Edema (3+) present.     Left lower leg: Edema (3+) present.  Skin:    General: Skin is warm and dry.     Capillary Refill: Capillary refill takes less than 2 seconds.  Neurological:     General: No focal deficit present.     Mental Status: She is alert and oriented to person, place, and time. Mental status is at baseline.     Gait: Gait normal.  Psychiatric:        Mood and Affect: Mood normal.        Behavior: Behavior normal.        Thought Content: Thought content normal.        Judgment: Judgment normal.       Assessment & Plan:   Discussion: Vital signs stable today.  Recommend patient follow-up with primary care regarding occasional dizziness specifically when having bowel movements.  Seasonal and perennial allergic rhinitis Plan: Restart  nasal saline rinses Continue Flonase twice daily Resume Atrovent nasal spray Continue Zyrtec Take chlor tabs at night We will defer additional prednisone at this time as patient did not feel clinical improvement from recent prednisone taper  Pneumonia due to COVID-19 virus Patient continued to have ongoing cough and postnasal drip Suspect this is more chronic rhinitis and upper airway cough syndrome Obviously contributing component is COVID-19 infection from January/2021 Cough could be residual from this  COUGH, CHRONIC Acute on chronic flare of chronic cough Patient nonadherent to Atrovent nasal spray No clinical improvement after recent Augmentin as well as prednisone  Plan: Continue Zyrtec Continue Flonase twice daily Nasal saline rinses twice daily Resume Atrovent nasal sprays 3 times a day as needed Use caution when swallowing Use Chlortab 4 mg at night    Return in about 6 weeks (around 09/30/2019), or if symptoms worsen or fail to improve, for Follow up with Dr. Lamonte Sakai.   Lauraine Rinne, NP 08/19/2019   This appointment required 32 minutes of patient care (this includes precharting, chart review, review of results, face-to-face care, etc.).

## 2019-08-19 NOTE — Assessment & Plan Note (Signed)
Acute on chronic flare of chronic cough Patient nonadherent to Atrovent nasal spray No clinical improvement after recent Augmentin as well as prednisone  Plan: Continue Zyrtec Continue Flonase twice daily Nasal saline rinses twice daily Resume Atrovent nasal sprays 3 times a day as needed Use caution when swallowing Use Chlortab 4 mg at night

## 2019-08-19 NOTE — Assessment & Plan Note (Signed)
Patient continued to have ongoing cough and postnasal drip Suspect this is more chronic rhinitis and upper airway cough syndrome Obviously contributing component is COVID-19 infection from January/2021 Cough could be residual from this

## 2019-08-19 NOTE — Assessment & Plan Note (Signed)
Plan: Restart nasal saline rinses Continue Flonase twice daily Resume Atrovent nasal spray Continue Zyrtec Take chlor tabs at night We will defer additional prednisone at this time as patient did not feel clinical improvement from recent prednisone taper

## 2019-08-19 NOTE — Patient Instructions (Addendum)
You were seen today by Lauraine Rinne, NP  for:   1. Seasonal and perennial allergic rhinitis 2. COUGH, CHRONIC  Continue daily Zyrtec  Please continue taking chlorpheniramine (aka Chlor tabs) 4 mg tablet (1 to 2 tablets at night) for management of allergies and postnasal drip at night >>> This is an over-the-counter medication >>> This medication is sedating  Start nasal saline rinses twice daily Use distilled water Shake well Get bottle lukewarm like a baby bottle  Continue Flonase twice daily  Continue Atrovent 3 times daily as needed >>> Please resume use  I would not recommend starting dietary supplements such as turmeric, ginger, aloe vera      For the syncope/dizziness that you had today while using the bathroom please contact primary care   Follow Up:    Return in about 6 weeks (around 09/30/2019), or if symptoms worsen or fail to improve, for Follow up with Dr. Lamonte Sakai.   Please do your part to reduce the spread of COVID-19:      Reduce your risk of any infection  and COVID19 by using the similar precautions used for avoiding the common cold or flu:  Marland Kitchen Wash your hands often with soap and warm water for at least 20 seconds.  If soap and water are not readily available, use an alcohol-based hand sanitizer with at least 60% alcohol.  . If coughing or sneezing, cover your mouth and nose by coughing or sneezing into the elbow areas of your shirt or coat, into a tissue or into your sleeve (not your hands). Langley Gauss A MASK when in public  . Avoid shaking hands with others and consider head nods or verbal greetings only. . Avoid touching your eyes, nose, or mouth with unwashed hands.  . Avoid close contact with people who are sick. . Avoid places or events with large numbers of people in one location, like concerts or sporting events. . If you have some symptoms but not all symptoms, continue to monitor at home and seek medical attention if your symptoms worsen. . If you  are having a medical emergency, call 911.   [place] / e-Visit: [email or web address]         [facility]: [facility]: [phone]                   [facility]: [phone]     It is flu season:   >>> Best ways to protect herself from the flu: Receive the yearly flu vaccine, practice good hand hygiene washing with soap and also using hand sanitizer when available, eat a nutritious meals, get adequate rest, hydrate appropriately   Please contact the office if your symptoms worsen or you have concerns that you are not improving.   Thank you for choosing [facility] for your healthcare, and for allowing [place] to partner with you on your healthcare journey. I am thankful to be able to provide care to you today.   Wyn Quaker FNP-C

## 2019-08-20 ENCOUNTER — Telehealth: Payer: Self-pay

## 2019-08-20 ENCOUNTER — Telehealth: Payer: Self-pay | Admitting: Pulmonary Disease

## 2019-08-20 MED ORDER — BENZONATATE 200 MG PO CAPS: 200.0000 mg | ORAL_CAPSULE | Freq: Three times a day (TID) | ORAL | 5 refills | Status: DC | PRN

## 2019-08-20 NOTE — Telephone Encounter (Signed)
Pt was advised by pulmonologist to call and notify you of a syncope episode she experienced yesterday. She also felt clammy and got horrible cramps. When she experienced this 15 years ago it caused her to pass out. She is afraid that this may happen again and will pass out with no one home.

## 2019-08-20 NOTE — Telephone Encounter (Signed)
Please schedule an appointment with the pt.   Thank You.

## 2019-08-20 NOTE — Telephone Encounter (Signed)
Patient called and requesting refill on tessalon for cough. Refill sent to preferred pharmacy.

## 2019-08-20 NOTE — Telephone Encounter (Signed)
Called pt to schedule appt. No in office appointment available until August. Pt states she will call back later this week to see if any appointments have opened.

## 2019-08-21 ENCOUNTER — Ambulatory Visit: Payer: Medicare Other | Admitting: Podiatry

## 2019-08-22 ENCOUNTER — Ambulatory Visit (INDEPENDENT_AMBULATORY_CARE_PROVIDER_SITE_OTHER): Payer: Medicare Other | Admitting: Surgery

## 2019-08-22 ENCOUNTER — Encounter: Payer: Self-pay | Admitting: Surgery

## 2019-08-22 ENCOUNTER — Telehealth: Payer: Self-pay | Admitting: Family Medicine

## 2019-08-22 VITALS — BP 142/82 | HR 93 | Temp 97.8°F

## 2019-08-22 DIAGNOSIS — S322XXA Fracture of coccyx, initial encounter for closed fracture: Secondary | ICD-10-CM

## 2019-08-22 NOTE — Telephone Encounter (Signed)
Patient called and stated she had her appointment with ortho and they told her they didn't know why she needed the Xray to begin with so patient isn't expiercing any pain and doesn't know if she needs to schedule a F.U appt with dr.andy

## 2019-08-22 NOTE — Progress Notes (Signed)
Office Visit Note   Patient: Robin Harrell           Date of Birth: [birth year]           MRN: [id number] Visit Date: 08/22/2019              Requested by: Inda Coke, [place],  [place] [id number] PCP: [name], MD   Assessment & Plan: Visit Diagnoses:  1. Closed fracture of coccyx, initial encounter [facility])     Plan Very likely healed coccyx fracture from January 2021.  Advised patient that there is really not much we can offer at this point and even if she was having pain in this area this is still treated conservatively.  If pain returns she could try using a doughnut cushion.  Follow-up with [place] as needed.  Return if there are any future problems.   Follow-Up Instructions: Return if symptoms worsen or fail to improve.   Orders:  No orders of the defined types were placed in this encounter.  No orders of the defined types were placed in this encounter.     Procedures: No procedures performed   Clinical Data: No additional findings.   Subjective: Chief Complaint  Patient presents with  . Tailbone    HPI 86 year old white female comes in for evaluation of possible coccyx fracture.  While in a nursing home patient states that she fell January 2020.  Immediately after the fall she did have some pain around her tailbone.  Currently she denies any pain in that area.  No complaints of back pain.  She was seen by her primary care office recently and per the radiology report states that she was complaining of persistent pain in the tailbone.  X-ray from August 15, 2019 showed:  EXAM: SACRUM AND COCCYX - 2+ VIEW  COMPARISON:  08/10/2018  FINDINGS: Osteopenia, which significantly limits radiographic evaluation of the sacrum, coccyx, and pelvis. No obvious displaced fracture of the sacrum. There is angulation of the distal coccyx. Severe pubic symphysis arthrosis, unchanged compared to prior examination dated 08/10/2018. Partially  imaged dextroscoliosis and disc degenerative disease of the included lower lumbar spine.  IMPRESSION: 1. Osteopenia, which significantly limits radiographic evaluation of the sacrum, coccyx, and pelvis.  2. No obvious displaced fracture of the sacrum. There is angulation of the distal coccyx, which may reflect fracture or developmental variant, and cannot be compared to frontal only radiographs dated 08/10/2018.  3. Severe pubic symphysis arthrosis, unchanged compared to prior examination dated 08/10/2018.   Electronically Signed   By: Eddie Candle M.D.   On: 08/15/2019 09:03  Again today she states that she is doing well.  Exam Very pleasant elderly white female alert and oriented in no acute distress.  Nontender over the lumbar spine, sacrum, and coccyx.   Review of Systems No current cardiac pulmonary GI GU issues  Objective: Vital Signs: BP (!) 142/82   Pulse 93   Temp 97.8 F (36.6 C)   LMP  (LMP Unknown)   Physical Exam  Ortho Exam  Specialty Comments:  No specialty comments available.  Imaging: No results found.   PMFS History: Patient Active Problem List   Diagnosis Date Noted  . Skin ulcer of toe of right foot, limited to breakdown of skin ([place]) 05/09/2019  . Acute respiratory disease due to COVID-19 virus 02/26/2019  . Pneumonia due to COVID-19 virus 02/26/2019  . Acute respiratory failure due to COVID-19 ([place]) 02/25/2019  . Kyphosis  due to osteoporosis 01/29/2019  . Chronic venous stasis dermatitis of both lower extremities 12/31/2018  . Aortic stenosis, mild 02/07/2017  . History of recurrent UTIs 01/26/2017  . Chronic idiopathic thrombocytopenia ([place]) 04/14/2016  . Essential tremor 09/03/2015  . History of respiratory failure 11/11/2014  . Hypothyroidism (acquired) 09/02/2014  . Edema of right lower extremity 02/01/2013  . Hyperlipidemia 11/19/2012  . Colon polyp 10/15/2012  . Long term (current) use of anticoagulants 05/10/2012  .  Insomnia 10/11/2011  . Osteoarthritis, multiple sites 06/30/2011  . Diastolic dysfunction 64/40/3474  . Factor V deficiency ([place]) 02/09/2010  . Osteoporosis 02/09/2010  . DJD (degenerative joint disease), lumbar 10/14/2009  . Glaucoma 01/29/2008  . Essential hypertension 11/15/2007  . History of DVT (deep vein thrombosis) 11/15/2007  . Seasonal and perennial allergic rhinitis 11/15/2007  . GERD 11/15/2007  . COUGH, CHRONIC 11/15/2007   Past Medical History:  Diagnosis Date  . Allergic rhinitis   . Cough   . Difficulty in swallowing    w/ occasional aspiration  . DVT (deep venous thrombosis) ([place])   . Dyslipidemia   . Factor V Leiden deficiency    lifelong coumadin  . GERD (gastroesophageal reflux disease)   . History of nuclear stress test 12/28/2007   lexiscan; low risk   . HTN (hypertension)   . PND (post-nasal drip)   . Pulmonary embolism ([place])   . PVC's (premature ventricular contractions)   . Thyroid disease     Family History  Problem Relation Age of Onset  . Lung cancer Father   . Cirrhosis Father   . Coronary artery disease Maternal Grandfather        MI  . Throat cancer Maternal Grandfather   . Heart disease Paternal Grandmother   . Coronary artery disease Paternal Grandfather        MI  . Throat cancer Paternal Grandfather   . Allergic rhinitis Sister   . Skin cancer Brother   . Breast cancer Daughter 21    Past Surgical History:  Procedure Laterality Date  . APPENDECTOMY    . BUNIONECTOMY    . CATARACT EXTRACTION W/ INTRAOCULAR LENS IMPLANT Right 11/02/2013  . CHOLECYSTECTOMY  1973  . ESOPHAGEAL DILATION    . HAMMER TOE SURGERY    . history of sleep study  12/27/2007   AHI during total sleep 0.9/hr and REM 1.5/hr; RDI during total sleep6.0/hr and REM 6.1/hr  . 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 TRANSTHORACIC ECHOCARDIOGRAM  12/28/2007   borderline conc LVH with normal systolic function; MV mod thickened with mild MVP & mild MR   Social  History   Occupational History  . Occupation: Network engineer - retired  Tobacco Use  . Smoking status: Former Smoker    Years: 1.00    Types: Cigarettes    Quit date: 02/21/1958    Years since quitting: 61.5  . Smokeless tobacco: Never Used  Vaping Use  . Vaping Use: Never used  Substance and Sexual Activity  . Alcohol use: Yes    Comment: rareley one glass of wine  . Drug use: No  . Sexual activity: Never

## 2019-08-23 ENCOUNTER — Telehealth: Payer: Self-pay | Admitting: *Deleted

## 2019-08-23 NOTE — Telephone Encounter (Signed)
Pt called states she missed her appt with Dr. Elisha Ponder and wanted to get in an appt with in the next 3 weeks with Dr. Elisha Ponder or Dr. Jacqualyn Posey.

## 2019-08-23 NOTE — Telephone Encounter (Signed)
Do you want her to come in for f/u  

## 2019-08-27 NOTE — Telephone Encounter (Signed)
If she is feeling better, no f/u needed.  I believe she is referring to her tailbone pain? She was referred by sam.

## 2019-08-29 NOTE — Telephone Encounter (Signed)
Left message to return call to our office.  

## 2019-08-29 NOTE — Telephone Encounter (Signed)
Spoke to pt asked her how her tailbone pain is? Pt said she is doing okay, she was in pain the other day but it has subsided. Told pt she does not need a f/u with Dr. Jonni Sanger unless her pain comes back that she can not handle. Pt verbalized understanding.

## 2019-08-30 ENCOUNTER — Other Ambulatory Visit: Payer: Self-pay | Admitting: Emergency Medicine

## 2019-08-30 MED ORDER — BENZONATATE 200 MG PO CAPS: 200.0000 mg | ORAL_CAPSULE | Freq: Three times a day (TID) | ORAL | 5 refills | Status: DC | PRN

## 2019-08-30 NOTE — Telephone Encounter (Signed)
Patient is still coughing but its not as bad as it was before. Today she got choked on the sputum that she was coughing up. She states that it is clear/white but very thick, sometimes she has to use a tissue to get it out of her mouth and off her tongue.   Would like refill on Tessalon or given an antibiotic.  [facility] please advise

## 2019-08-30 NOTE — Telephone Encounter (Signed)
Called and spoke with pt letting her know the info stated by [facility] and she verbalized understanding. Rx for benzonatate refilled for pt. Nothing further needed.

## 2019-08-30 NOTE — Telephone Encounter (Signed)
Recommend she take mucinex 600mg  1-2 tabs twice daily; We can refill tessalon perles 100mg  three times a day as needed (please send) If she develops fever, purulent mucus, worsening shortness of breath let us know

## 2019-09-03 ENCOUNTER — Encounter: Payer: Self-pay | Admitting: Surgery

## 2019-09-04 ENCOUNTER — Other Ambulatory Visit: Payer: Self-pay

## 2019-09-04 ENCOUNTER — Encounter: Payer: Self-pay | Admitting: Podiatry

## 2019-09-04 ENCOUNTER — Ambulatory Visit (INDEPENDENT_AMBULATORY_CARE_PROVIDER_SITE_OTHER): Payer: Medicare Other | Admitting: Podiatry

## 2019-09-04 ENCOUNTER — Telehealth: Payer: Self-pay | Admitting: Family Medicine

## 2019-09-04 DIAGNOSIS — L84 Corns and callosities: Secondary | ICD-10-CM

## 2019-09-04 DIAGNOSIS — D6851 Activated protein C resistance: Secondary | ICD-10-CM

## 2019-09-04 DIAGNOSIS — B351 Tinea unguium: Secondary | ICD-10-CM

## 2019-09-04 DIAGNOSIS — M79676 Pain in unspecified toe(s): Secondary | ICD-10-CM

## 2019-09-04 MED ORDER — AMOXICILLIN 500 MG PO CAPS
500.0000 mg | ORAL_CAPSULE | Freq: Two times a day (BID) | ORAL | 0 refills | Status: AC
Start: 2019-09-04 — End: 2019-09-11

## 2019-09-04 NOTE — Telephone Encounter (Signed)
FYI, patient has been seen @ [place] recently

## 2019-09-04 NOTE — Patient Instructions (Signed)
DRESSING CHANGES R 3rd toe :   A. IF DISPENSED, WEAR SURGICAL SHOE/BOOT AT ALL TIMES.  B. IF PRESCRIBED ORAL ANTIBIOTICS, TAKE ALL MEDICATION AS PRESCRIBED UNTIL ALL ARE GONE.  C. IF DOCTOR HAS DESIGNATED NONWEIGHTBEARING STATUS, PLEASE ADHERE TO INSTRUCTIONS  1. KEEP right foot DRY AT ALL TIMES!!!!  2. CLEANSE ULCER WITH SALINE.  3. DAB DRY WITH GAUZE SPONGE.  4. APPLY A LIGHT AMOUNT OF Betadine Solution TO BASE OF ULCER.  5. APPLY OUTER DRESSING AS INSTRUCTED.  6. WEAR SURGICAL SHOE/BOOT DAILY AT ALL TIMES. IF SUPPLIED, WEAR HEEL PROTECTORS AT ALL TIMES WHEN IN BED.  7. DO NOT WALK BAREFOOT!!!  8.  IF YOU EXPERIENCE ANY FEVER, CHILLS, NIGHTSWEATS, NAUSEA OR VOMITING, ELEVATED OR LOW BLOOD SUGARS, REPORT TO EMERGENCY ROOM.  9. IF YOU EXPERIENCE INCREASED REDNESS, PAIN, SWELLING, DISCOLORATION, ODOR, PUS, DRAINAGE OR WARMTH OF YOUR FOOT, REPORT TO EMERGENCY ROOM.

## 2019-09-04 NOTE — Telephone Encounter (Signed)
Nurse Assessment Nurse: Joya Gaskins, RN, Vonna Kotyk Date/Time Robin Harrell Time): 09/03/2019 4:43:15 PM Confirm and document reason for call. If symptomatic, describe symptoms. ---Caller states that she is having pain in her right buttock. This began this morning. Has the patient had close contact with a person known or suspected to have the novel coronavirus illness OR traveled / lives in area with major community spread (including international travel) in the last 14 days from the onset of symptoms? * If Asymptomatic, screen for exposure and travel within the last 14 days. ---No Does the patient have any new or worsening symptoms? ---Yes Will a triage be completed? ---Yes Related visit to physician within the last 2 weeks? ---No Does the PT have any chronic conditions? (i.e. diabetes, asthma, this includes High risk factors for pregnancy, etc.) ---Yes List chronic conditions. ---pulmonary issues Is this a behavioral health or substance abuse call? ---No Guidelines Guideline Title Affirmed Question Affirmed Notes Nurse Date/Time Robin Harrell Time) Hip Pain Hip pain [name] 09/03/2019 4:45:44 PM Disp. Time Robin Harrell Time) Disposition Final User 09/03/2019 4:50:02 PM Home Care Yes Joya Gaskins, RN, JoshuaPLEASE NOTE: All timestamps contained within this report are represented as [place] Standard Time. CONFIDENTIALTY NOTICE: This fax transmission is intended only for the addressee. It contains information that is legally privileged, confidential or otherwise protected from use or disclosure. If you are not the intended recipient, you are strictly prohibited from reviewing, disclosing, copying using or disseminating any of this information or taking any action in reliance on or regarding this information. If you have received this fax in error, please notify us immediately by telephone so that we can arrange for its return to [place]. Phone: [phone], Toll-Free: [phone], Fax: [phone] Page: 2 of  2 Call Id: [id number] [place] Disagree/Comply Comply Caller Understands Yes PreDisposition Did not know what to do Care Advice Given Per Guideline HOME CARE: * You should be able to treat this at home. * The symptoms you describe do not sound serious. You have told me that there is no redness, swelling, or fever. You have also told me that there has been no recent major injury. REST YOUR HIP FOR THE NEXT COUPLE DAYS: * Avoid activities that worsen your pain. LOCAL HEAT: * Apply a warm wet washcloth or heating pad for 10 minutes three times daily. PAIN MEDICINES: CALL BACK IF: * Fever occurs * Redness occurs * Pain persists over 7 days * You become worse. CARE ADVICE given per Hip Pain (Adult) guideline

## 2019-09-05 ENCOUNTER — Telehealth: Payer: Self-pay | Admitting: Emergency Medicine

## 2019-09-05 NOTE — Telephone Encounter (Signed)
LMTCB x1 for pt.  

## 2019-09-06 NOTE — Progress Notes (Signed)
Subjective: Robin Harrell presents today at risk foot care. Patient has history of clotting disorder, Factor V Leiden deficiency and painful thick toenails that are difficult to trim. Pain interferes with ambulation. Aggravating factors include wearing enclosed shoe gear. Pain is relieved with periodic professional debridement.   She has chronic preulcerative lesion distal tip of right 3rd digit. Today, she states it is painful and tender to touch. She has been using Silicone toe cap and states it is not providing any relief. She denies any drainage or swelling of digit. She has recovered from her pneumonia and is feeling better.  [name], MD is patient's PCP. Last visit was: 05/16/2019.  Past Medical History:  Diagnosis Date  . Allergic rhinitis   . Cough   . Difficulty in swallowing    w/ occasional aspiration  . DVT (deep venous thrombosis) ([place])   . Dyslipidemia   . Factor V Leiden deficiency    lifelong coumadin  . GERD (gastroesophageal reflux disease)   . History of nuclear stress test 12/28/2007   lexiscan; low risk   . HTN (hypertension)   . PND (post-nasal drip)   . Pulmonary embolism ([place])   . PVC's (premature ventricular contractions)   . Thyroid disease      Patient Active Problem List   Diagnosis Date Noted  . Skin ulcer of toe of right foot, limited to breakdown of skin ([place]) 05/09/2019  . Acute respiratory disease due to COVID-19 virus 02/26/2019  . Pneumonia due to COVID-19 virus 02/26/2019  . Acute respiratory failure due to COVID-19 ([place]) 02/25/2019  . 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 Chronic venous stasis dermatitis of both lower extremities 12/31/2018  . Aortic stenosis, mild 02/07/2017  . History of recurrent UTIs 01/26/2017  . Chronic idiopathic thrombocytopenia ([place]) 04/14/2016  . Essential tremor 09/03/2015  . History of respiratory failure 11/11/2014  . Hypothyroidism (acquired) 09/02/2014  . Edema of right lower extremity  02/01/2013  . Hyperlipidemia 11/19/2012  . Colon polyp 10/15/2012  . Long term (current) use of anticoagulants 05/10/2012  . Insomnia 10/11/2011  . Osteoarthritis, multiple sites 06/30/2011  . Diastolic dysfunction 91/47/8295  . Factor V deficiency ([place]) 02/09/2010  . Osteoporosis 02/09/2010  . DJD (degenerative joint disease), lumbar 10/14/2009  . Glaucoma 01/29/2008  . Essential hypertension 11/15/2007  . History of DVT (deep vein thrombosis) 11/15/2007  . Seasonal and perennial allergic rhinitis 11/15/2007  . GERD 11/15/2007  . COUGH, CHRONIC 11/15/2007    Current Outpatient Medications on File Prior to Visit  Medication Sig Dispense Refill  . acetaminophen (TYLENOL) 500 MG tablet Take 1,000 mg by mouth every 6 (six) hours as needed for moderate pain.    Marland Kitchen amoxicillin-clavulanate (AUGMENTIN) 400-57 MG/5ML suspension Take 10 mLs (800 mg total) by mouth 2 (two) times daily. 140 mL 0  . atorvastatin (LIPITOR) 10 MG tablet TAKE 1 TABLET (10 MG TOTAL) BY MOUTH DAILY AT 6 PM. 90 tablet 2  . benzonatate (TESSALON) 200 MG capsule Take 1 capsule (200 mg total) by mouth 3 (three) times daily as needed for cough. 30 capsule 5  . brimonidine (ALPHAGAN) 0.15 % ophthalmic solution Place 2 drops into both eyes 2 (two) times daily.   3  . Calcium-Phosphorus-Vitamin D (CALCIUM/D3 ADULT GUMMIES) 200-96.6-200 MG-MG-UNIT CHEW Chew 2 tablets by mouth daily.    . cetirizine (ZYRTEC) 10 MG tablet TAKE 1 TABLET BY MOUTH EVERY DAY 90 tablet 3  . chlorpheniramine (CHLOR-TRIMETON) 4 MG tablet Take 4 mg by mouth  every morning.     . clotrimazole-betamethasone (LOTRISONE) cream Apply to both feet and between toes bid x 4 weeks. 45 g 0  . diclofenac Sodium (VOLTAREN) 1 % GEL Apply 2 g topically 4 (four) times daily. 50 g 1  . fluticasone (FLONASE) 50 MCG/ACT nasal spray SPRAY 2 SPRAYS INTO EACH NOSTRIL EVERY DAY 48 mL 1  . ipratropium (ATROVENT) 0.03 % nasal spray PLACE 2 SPRAYS INTO BOTH NOSTRILS 3 (THREE)  TIMES DAILY AS NEEDED FOR RHINITIS. 30 mL 6  . latanoprost (XALATAN) 0.005 % ophthalmic solution Place 1 drop into both eyes at bedtime.     Marland Kitchen levothyroxine (SYNTHROID) 25 MCG tablet Take 1 tablet (25 mcg total) by mouth daily before breakfast. 90 tablet 3  . losartan (COZAAR) 50 MG tablet Take 1 tablet (50 mg total) by mouth daily. 90 tablet 1  . metoprolol tartrate (LOPRESSOR) 25 MG tablet TAKE 1 TABLET (25 MG TOTAL) BY MOUTH 2 (TWO) TIMES DAILY. *NEEDS OFFICE VISIT FOR FURTHER REFILLS* 90 tablet 3  . multivitamin-iron-minerals-folic acid (CENTRUM) chewable tablet Chew 1 tablet by mouth daily.    . vitamin E (VITAMIN E) 1000 UNIT capsule Take 1,000 Units by mouth daily.    Marland Kitchen warfarin (COUMADIN) 7.5 MG tablet TAKE 1/2 TO 1 TABLET BY MOUTH DAILY AS DIRECTED 90 tablet 1   No current facility-administered medications on file prior to visit.     Allergies  Allergen Reactions  . Azithromycin Nausea Only  . Codeine Nausea Only  . Erythromycin Nausea And Vomiting    Objective: Robin Harrell is a pleasant 86 y.o. Caucasian female WD, WN in NAD. AAO x 3.  There were no vitals filed for this visit.  Vascular Examination: Capillary fill time to digits <3 seconds b/l lower extremities. Palpable pedal pulses b/l LE. Pedal hair absent. Lower extremity skin temperature gradient within normal limits. No pain with calf compression b/l. Varicosities present b/l. No ischemia or gangrene noted b/l lower extremities.  Dermatological Examination: Pedal skin with normal turgor, texture and tone bilaterally. No open wounds bilaterally. No interdigital macerations bilaterally. Toenails 1-5 b/l elongated, discolored, dystrophic, thickened, crumbly with subungual debris and tenderness to dorsal palpation. Right 3rd digit with macerated distal tip and preulcerative center measuring 0.5 x 0.5 x 0.1 cm. +Tenderness to palpation. No purulence. No edema. No abnormal coolness..  Musculoskeletal: Normal muscle  strength 5/5 to all lower extremity muscle groups bilaterally. No pain crepitus or joint limitation noted with ROM b/l. Hammertoes noted to the 2-5 bilaterally.  Neurological Examination: Protective sensation intact 5/5 intact bilaterally with 10g monofilament b/l. Vibratory sensation intact b/l. Proprioception intact bilaterally. Clonus negative b/l.    Assessment: 1. Pain due to onychomycosis of toenail   2. Pre-ulcerative corn or callous   3. Factor V Leiden ([place])    Plan: -Examined patient. -Toenails 1-5 b/l were debrided in length and girth with sterile nail nippers and dremel without iatrogenic bleeding.  -Patient to report any pedal injuries to medical professional immediately. -Right 3rd digit preulcerative corn pared. We will start her on amoxicillin 500 mg po bid for 7 days since toe is so tender. Discontinue silicone toe cap. Dispensed foam toe cap. -Patient to continue soft, supportive shoe gear daily. -Patient/POA to call should there be question/concern in the interim.  Return in about 1 week (around 09/11/2019) for nail and callus trim.  Marzetta Board, DPM

## 2019-09-06 NOTE — Telephone Encounter (Signed)
LMTCB x2 for pt 

## 2019-09-09 ENCOUNTER — Telehealth: Payer: Self-pay | Admitting: Emergency Medicine

## 2019-09-09 NOTE — Telephone Encounter (Signed)
Patient advised she can get zyrtec over the conter in a generic form. She stated that was all she needed at this time nothing further needed.

## 2019-09-09 NOTE — Telephone Encounter (Signed)
LMTCB x 3 and will close per protocol 

## 2019-09-10 ENCOUNTER — Other Ambulatory Visit: Payer: Self-pay

## 2019-09-10 ENCOUNTER — Ambulatory Visit (INDEPENDENT_AMBULATORY_CARE_PROVIDER_SITE_OTHER): Payer: Medicare Other | Admitting: Podiatry

## 2019-09-10 ENCOUNTER — Encounter: Payer: Self-pay | Admitting: Podiatry

## 2019-09-10 DIAGNOSIS — Z7901 Long term (current) use of anticoagulants: Secondary | ICD-10-CM | POA: Diagnosis not present

## 2019-09-10 DIAGNOSIS — M2041 Other hammer toe(s) (acquired), right foot: Secondary | ICD-10-CM

## 2019-09-10 DIAGNOSIS — D6851 Activated protein C resistance: Secondary | ICD-10-CM

## 2019-09-10 DIAGNOSIS — L84 Corns and callosities: Secondary | ICD-10-CM

## 2019-09-13 ENCOUNTER — Other Ambulatory Visit: Payer: Self-pay

## 2019-09-13 ENCOUNTER — Ambulatory Visit (INDEPENDENT_AMBULATORY_CARE_PROVIDER_SITE_OTHER): Payer: Medicare Other | Admitting: Pharmacist

## 2019-09-13 DIAGNOSIS — Z7901 Long term (current) use of anticoagulants: Secondary | ICD-10-CM | POA: Diagnosis not present

## 2019-09-13 LAB — POCT INR: INR: 2.7 (ref 2.0–3.0)

## 2019-09-13 NOTE — Progress Notes (Signed)
Subjective: Robin Harrell presents today follow up of preulcerative corn to the {R 3rd toe.   She has chronic preulcerative lesion distal tip of right 3rd digit. On last visit, I prescribed amoxicillin 500 mg bid for 7 days. She has about 4 capsules left. She relates it feels better, but is still sore. She doesn't feel foam toe cap helped much. She denies any drainage, swelling or redness.   She is wearing extra depth shoes with plastizote insoles.  Robin Arnt, MD is patient's PCP. Last visit was: 05/16/2019.  Past Medical History:  Diagnosis Date   Allergic rhinitis    Cough    Difficulty in swallowing    w/ occasional aspiration   DVT (deep venous thrombosis) ([facility])    Dyslipidemia    Factor V Leiden deficiency    lifelong coumadin   GERD (gastroesophageal reflux disease)    History of nuclear stress test 12/28/2007   lexiscan; low risk    HTN (hypertension)    PND (post-nasal drip)    Pulmonary embolism ([facility])    PVC's (premature ventricular contractions)    Thyroid disease      Patient Active Problem List   Diagnosis Date Noted   Skin ulcer of toe of right foot, limited to breakdown of skin ([place]) 05/09/2019   Acute respiratory disease due to COVID-19 virus 02/26/2019   Pneumonia due to COVID-19 virus 02/26/2019   Acute respiratory failure due to COVID-19 ([place]) 02/25/2019   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 01/29/2019   Chronic venous stasis dermatitis of both lower extremities 12/31/2018   Aortic stenosis, mild 02/07/2017   History of recurrent UTIs 01/26/2017   Chronic idiopathic thrombocytopenia ([place]) 04/14/2016   Essential tremor 09/03/2015   History of respiratory failure 11/11/2014   Hypothyroidism (acquired) 09/02/2014   Edema of right lower extremity 02/01/2013   Hyperlipidemia 11/19/2012   Colon polyp 10/15/2012   Long term (current) use of anticoagulants 05/10/2012   Insomnia 10/11/2011   Osteoarthritis, multiple sites  83/38/2505   Diastolic dysfunction 39/76/7341   Factor V deficiency ([place]) 02/09/2010   Osteoporosis 02/09/2010   DJD (degenerative joint disease), lumbar 10/14/2009   Glaucoma 01/29/2008   Essential hypertension 11/15/2007   History of DVT (deep vein thrombosis) 11/15/2007   Seasonal and perennial allergic rhinitis 11/15/2007   GERD 11/15/2007   COUGH, CHRONIC 11/15/2007    Current Outpatient Medications on File Prior to Visit  Medication Sig Dispense Refill   acetaminophen (TYLENOL) 500 MG tablet Take 1,000 mg by mouth every 6 (six) hours as needed for moderate pain.     amoxicillin-clavulanate (AUGMENTIN) 400-57 MG/5ML suspension Take 10 mLs (800 mg total) by mouth 2 (two) times daily. 140 mL 0   atorvastatin (LIPITOR) 10 MG tablet TAKE 1 TABLET (10 MG TOTAL) BY MOUTH DAILY AT 6 PM. 90 tablet 2   benzonatate (TESSALON) 200 MG capsule Take 1 capsule (200 mg total) by mouth 3 (three) times daily as needed for cough. 30 capsule 5   brimonidine (ALPHAGAN) 0.15 % ophthalmic solution Place 2 drops into both eyes 2 (two) times daily.   3   Calcium-Phosphorus-Vitamin D (CALCIUM/D3 ADULT GUMMIES) 200-96.6-200 MG-MG-UNIT CHEW Chew 2 tablets by mouth daily.     cetirizine (ZYRTEC) 10 MG tablet TAKE 1 TABLET BY MOUTH EVERY DAY 90 tablet 3   chlorpheniramine (CHLOR-TRIMETON) 4 MG tablet Take 4 mg by mouth every morning.      clotrimazole-betamethasone (LOTRISONE) cream Apply to both feet and between toes bid x 4 weeks.  45 g 0   diclofenac Sodium (VOLTAREN) 1 % GEL Apply 2 g topically 4 (four) times daily. 50 g 1   fluticasone (FLONASE) 50 MCG/ACT nasal spray SPRAY 2 SPRAYS INTO EACH NOSTRIL EVERY DAY 48 mL 1   ipratropium (ATROVENT) 0.03 % nasal spray PLACE 2 SPRAYS INTO BOTH NOSTRILS 3 (THREE) TIMES DAILY AS NEEDED FOR RHINITIS. 30 mL 6   latanoprost (XALATAN) 0.005 % ophthalmic solution Place 1 drop into both eyes at bedtime.      levothyroxine (SYNTHROID) 25 MCG tablet  Take 1 tablet (25 mcg total) by mouth daily before breakfast. 90 tablet 3   losartan (COZAAR) 50 MG tablet Take 1 tablet (50 mg total) by mouth daily. 90 tablet 1   metoprolol tartrate (LOPRESSOR) 25 MG tablet TAKE 1 TABLET (25 MG TOTAL) BY MOUTH 2 (TWO) TIMES DAILY. *NEEDS OFFICE VISIT FOR FURTHER REFILLS* 90 tablet 3   multivitamin-iron-minerals-folic acid (CENTRUM) chewable tablet Chew 1 tablet by mouth daily.     vitamin E (VITAMIN E) 1000 UNIT capsule Take 1,000 Units by mouth daily.     warfarin (COUMADIN) 7.5 MG tablet TAKE 1/2 TO 1 TABLET BY MOUTH DAILY AS DIRECTED 90 tablet 1   No current facility-administered medications on file prior to visit.     Allergies  Allergen Reactions   Azithromycin Nausea Only   Codeine Nausea Only   Erythromycin Nausea And Vomiting    Objective: Robin Harrell is a pleasant 86 y.o. Caucasian female WD, WN in NAD. AAO x 3.  There were no vitals filed for this visit.  Vascular Examination: Capillary fill time to digits <3 seconds b/l lower extremities. Palpable pedal pulses b/l LE. Pedal hair absent. Lower extremity skin temperature gradient within normal limits. No pain with calf compression b/l. Varicosities present b/l. No ischemia or gangrene noted b/l lower extremities.  Dermatological Examination: Pedal skin with normal turgor, texture and tone bilaterally. No open wounds bilaterally. No interdigital macerations bilaterally. Toenails 1-5 b/l well maintained with adequate length. No erythema, no edema, no drainage, no flocculence. Right 3rd digit with resolved maceration.  Digit remains preulcerative. +Tenderness to palpation. No purulence. No edema. No abnormal coolness..  Musculoskeletal: Normal muscle strength 5/5 to all lower extremity muscle groups bilaterally. No pain crepitus or joint limitation noted with ROM b/l. Hammertoes noted to the 2-5 bilaterally.  Neurological Examination: Protective sensation intact 5/5 intact  bilaterally with 10g monofilament b/l. Vibratory sensation intact b/l. Proprioception intact bilaterally. Clonus negative b/l.    Assessment: 1. Pre-ulcerative corn or callous   2. Factor V Leiden ([place])   3. Blood thinned due to long-term anticoagulant use   4. Hammer toe of right foot    Plan: -Examined patient. -Continue amoxicillin until all are gone.  -Applied triple antibiotic ointment and band-aid. I offloaded her insert with felt aperture pad. She related improvement in pain symptoms. -Patient to continue extra depth shoes daily. -I will have her follow up with Dr. Lanae Crumbly for recommendations. I feel her condition waxes and wanes. -Patient/POA to call should there be question/concern in the interim.  Return in about 1 week (around 09/17/2019) for chronic preulcerative corn right 3rd digit with Dr. Lanae Crumbly.  Marzetta Board, DPM

## 2019-09-16 ENCOUNTER — Ambulatory Visit: Payer: Medicare Other | Admitting: Emergency Medicine

## 2019-09-17 ENCOUNTER — Ambulatory Visit (INDEPENDENT_AMBULATORY_CARE_PROVIDER_SITE_OTHER): Payer: Medicare Other | Admitting: Podiatry

## 2019-09-17 ENCOUNTER — Encounter: Payer: Self-pay | Admitting: Podiatry

## 2019-09-17 ENCOUNTER — Other Ambulatory Visit: Payer: Self-pay

## 2019-09-17 DIAGNOSIS — M2041 Other hammer toe(s) (acquired), right foot: Secondary | ICD-10-CM

## 2019-09-17 DIAGNOSIS — Z7901 Long term (current) use of anticoagulants: Secondary | ICD-10-CM

## 2019-09-17 DIAGNOSIS — L84 Corns and callosities: Secondary | ICD-10-CM | POA: Diagnosis not present

## 2019-09-17 DIAGNOSIS — D6851 Activated protein C resistance: Secondary | ICD-10-CM | POA: Diagnosis not present

## 2019-09-17 DIAGNOSIS — L97511 Non-pressure chronic ulcer of other part of right foot limited to breakdown of skin: Secondary | ICD-10-CM

## 2019-09-17 NOTE — Progress Notes (Signed)
  Subjective:  Patient ID: Robin Harrell, female    DOB: 1933/04/12,  MRN: [id number]  Chief Complaint  Patient presents with  . Foot Ulcer    third right toe chronic ulcer. discuss possible tenotomy   86 y.o. female presents for consultation today from Dr. Elisha Ponder for contracted right third digit with distal tip ulceration.  She states that the ulcer seems to be improving.  It is painful for her.  She takes warfarin for factor V Leiden disorder.  Objective:  There were no vitals filed for this visit.  General AA&O x3. Normal mood and affect.  Vascular Pedal pulses palpable.  Neurologic Epicritic sensation grossly intact.  Dermatologic  healing ulceration at the tip of the right third digit  Orthopedic: Semi-reducible hammertoe deformity right, 3rd toe    Assessment & Plan:  Patient was evaluated and treated and all questions answered.  Hammertoe right 2nd toe with pre-ulcerative callus -Flexor tenotomy as below. -Advised to remove the dressing in 48 hours and apply a band-aid and triple abx ointment every day thereafter.  I advised her to leave it on for next 24 hours than normal due to her anticoagulant status  Procedure: Flexor Tenotomy Indication for Procedure: toe with semi-reducible hammertoe with distal tip ulceration. Flexor tenotomy indicated to alleviate contracture, reduce pressure, and enhance healing of the ulceration. Location: right, 3rd toe Anesthesia: 3 cc 0.5% Marcaine plain Instrumentation: 18 g needle  Technique: The toe was anesthetized as above and prepped in the usual fashion. The toe was exsanquinated and a tourniquet was secured at the base of the toe. An 18g needle was then used to percutaneously release the flexor tendon at the plantar surface of the toe with noted release of the hammertoe deformity. The incision was then dressed with antibiotic ointment and band-aid. Compression splint dressing applied. Patient tolerated the procedure well. Dressing:  Dry, sterile, compression dressing. Disposition: Patient tolerated procedure well. Patient to return in 3 week for follow-up.      Return in about 3 weeks (around 10/08/2019) for post op tenotomy.   Lanae Crumbly, DPM 09/17/2019

## 2019-09-25 ENCOUNTER — Other Ambulatory Visit: Payer: Self-pay | Admitting: Emergency Medicine

## 2019-09-27 ENCOUNTER — Telehealth: Payer: Self-pay | Admitting: Emergency Medicine

## 2019-09-27 NOTE — Telephone Encounter (Signed)
Called and spoke with patient regarding her Atrovent nasal spray.  She verified that it has been filled and that she was told by the pharmacy that she could pick it up on Monday.  She asked that I call the pharmacy and ask that she get it sooner.  I advised her that insurance will only pay for it every so often.  I advised her that she could feel free to call her pharmacy and double check the last date it was filled.  She verbalized understanding.  Nothing further needed.

## 2019-10-02 ENCOUNTER — Other Ambulatory Visit: Payer: Self-pay | Admitting: Emergency Medicine

## 2019-10-04 ENCOUNTER — Other Ambulatory Visit: Payer: Self-pay | Admitting: Emergency Medicine

## 2019-10-04 ENCOUNTER — Telehealth: Payer: Self-pay | Admitting: Emergency Medicine

## 2019-10-04 MED ORDER — IPRATROPIUM BROMIDE 0.03 % NA SOLN: 2.0000 | Freq: Three times a day (TID) | NASAL | 6 refills | Status: DC | PRN

## 2019-10-04 NOTE — Telephone Encounter (Signed)
Spoke with the pt and notified rx refill for atrovent was sent to pharm. Nothing further needed.

## 2019-10-08 ENCOUNTER — Other Ambulatory Visit: Payer: Self-pay

## 2019-10-08 ENCOUNTER — Other Ambulatory Visit: Payer: Medicare Other

## 2019-10-08 DIAGNOSIS — Z20822 Contact with and (suspected) exposure to covid-19: Secondary | ICD-10-CM

## 2019-10-09 LAB — NOVEL CORONAVIRUS, NAA: SARS-CoV-2, NAA: NOT DETECTED

## 2019-10-09 LAB — SARS-COV-2, NAA 2 DAY TAT

## 2019-10-10 ENCOUNTER — Encounter: Payer: Self-pay | Admitting: Podiatry

## 2019-10-10 ENCOUNTER — Ambulatory Visit (INDEPENDENT_AMBULATORY_CARE_PROVIDER_SITE_OTHER): Payer: Medicare Other | Admitting: Podiatry

## 2019-10-10 ENCOUNTER — Other Ambulatory Visit: Payer: Self-pay

## 2019-10-10 DIAGNOSIS — M2041 Other hammer toe(s) (acquired), right foot: Secondary | ICD-10-CM

## 2019-10-10 DIAGNOSIS — L97511 Non-pressure chronic ulcer of other part of right foot limited to breakdown of skin: Secondary | ICD-10-CM

## 2019-10-10 NOTE — Progress Notes (Signed)
  Subjective:  Patient ID: Robin Harrell, female    DOB: [birth year],  MRN: [id number]  Follow-up from flexor tenotomy right second toe  86 y.o. female presents with the above complaint. History confirmed with patient.  Feeling well.  She has had no issues and the wound is healed well.  No pain.  Objective:  Physical Exam: Distal tip callus and wound has completely healed, percutaneous incision from flexor tenotomy has completely healed as well. Assessment:   1. Chronic ulcer of toe, right, limited to breakdown of skin ([place])   2. Hammer toe of right foot      Plan:  Patient was evaluated and treated and all questions answered.  -She is healed well.  She has an appointment scheduled with Dr. Adah Perl for her regular visit and she will follow up then.  I advised her that if the toe next to this or any other toes begin to develop painful calluses that are similar that she should see me for further flexor tenotomy's as indicated prior to wound development.  Lanae Crumbly, DPM 10/10/2019     Return if symptoms worsen or fail to improve.

## 2019-10-11 ENCOUNTER — Ambulatory Visit (INDEPENDENT_AMBULATORY_CARE_PROVIDER_SITE_OTHER): Payer: Medicare Other

## 2019-10-11 DIAGNOSIS — Z7901 Long term (current) use of anticoagulants: Secondary | ICD-10-CM

## 2019-10-11 LAB — POCT INR: INR: 2 (ref 2.0–3.0)

## 2019-10-11 NOTE — Patient Instructions (Signed)
Continue taking 1 tablet daily except for 1/2 tablet each Monday, Wednesday and Friday.  Repeat INR in 6 weeks.   

## 2019-10-15 ENCOUNTER — Ambulatory Visit (INDEPENDENT_AMBULATORY_CARE_PROVIDER_SITE_OTHER): Payer: Medicare Other

## 2019-10-15 ENCOUNTER — Other Ambulatory Visit: Payer: Self-pay

## 2019-10-15 DIAGNOSIS — M810 Age-related osteoporosis without current pathological fracture: Secondary | ICD-10-CM | POA: Diagnosis not present

## 2019-10-15 MED ORDER — DENOSUMAB 60 MG/ML ~~LOC~~ SOSY
60.0000 mg | PREFILLED_SYRINGE | Freq: Once | SUBCUTANEOUS | Status: AC
  Administered 2019-10-15: 60 mg via SUBCUTANEOUS

## 2019-10-15 NOTE — Progress Notes (Signed)
See Hadden 86 y.o. female presents to office today for 6 month Prolia injection per Billey Chang, MD. Administered DENOSUMAB 60 mg/mL subcutaneous left arm. Patient tolerated well.

## 2019-10-20 ENCOUNTER — Other Ambulatory Visit: Payer: Self-pay | Admitting: Cardiovascular Disease

## 2019-10-20 ENCOUNTER — Other Ambulatory Visit: Payer: Self-pay | Admitting: Family Medicine

## 2019-10-20 DIAGNOSIS — I10 Essential (primary) hypertension: Secondary | ICD-10-CM

## 2019-10-22 ENCOUNTER — Ambulatory Visit (INDEPENDENT_AMBULATORY_CARE_PROVIDER_SITE_OTHER): Payer: Medicare Other | Admitting: Emergency Medicine

## 2019-10-22 ENCOUNTER — Other Ambulatory Visit: Payer: Self-pay

## 2019-10-22 ENCOUNTER — Encounter: Payer: Self-pay | Admitting: Emergency Medicine

## 2019-10-22 DIAGNOSIS — R059 Cough, unspecified: Secondary | ICD-10-CM

## 2019-10-22 DIAGNOSIS — R05 Cough: Secondary | ICD-10-CM | POA: Diagnosis not present

## 2019-10-22 MED ORDER — LORATADINE 10 MG PO TABS
10.0000 mg | ORAL_TABLET | Freq: Every day | ORAL | 11 refills | Status: DC
Start: 2019-10-22 — End: 2020-11-04

## 2019-10-22 NOTE — Patient Instructions (Signed)
Please continue nasal saline rinses twice a day Continue to use your ipratropium nasal spray and fluticasone nasal spray as you have been doing them. Continue your chlorpheniramine tabs as you have been taking it. Try stopping your cetirizine.  Start loratadine 10 mg once daily.  This is affordable at the [facility] and [facility] outpatient pharmacies. Continue to practice your swallowing precautions to avoid aspiration with meals Follow with Dr Lamonte Sakai in 6 months or sooner if you have any problems

## 2019-10-22 NOTE — Assessment & Plan Note (Signed)
Most significantly impacted by her chronic rhinitis, thick clear mucus.  She is on a good regimen but the cough persists.  Question whether she may also be having intermittent aspiration.  She is trying to practice swallowing precautions.  She denies any GERD symptoms but if the cough continues going forward then I would favor restarting empiric GERD therapy.  I will continue the same regimen, change her cetirizine to loratadine due to the expense.  Please continue nasal saline rinses twice a day Continue to use your ipratropium nasal spray and fluticasone nasal spray as you have been doing them. Continue your chlorpheniramine tabs as you have been taking it. Try stopping your cetirizine.  Start loratadine 10 mg once daily.  This is affordable at the [facility] and [facility] outpatient pharmacies. Continue to practice your swallowing precautions to avoid aspiration with meals Follow with Dr Lamonte Sakai in 6 months or sooner if you have any problems

## 2019-10-22 NOTE — Progress Notes (Signed)
Subjective:    Patient ID: Robin Harrell, female    DOB: December 27, 1933, 86 y.o.   MRN: [id number]  Cough   ROV 05/09/19 --86 year old woman with a history of chronic thromboembolic disease and PE with factor V Leiden deficiency, chronic cough due to rhinitis, GERD with hiatal hernia, intermittent aspiration/choking with some associated hoarseness.  She unfortunately had COVID-19 pneumonitis in January 2021, and is on supplemental oxygen.  She was treated 04/08/2019 for possible bronchitis versus pneumonia with Augmentin.  Currently managed on nasal saline rinses, ipratropium nasal spray, Zyrtec, chlor tabs. Still has breakthrough nasal gtt.  She reports that her mucous has cleared up - no longer green. Her cough is improved. She is not on any BD's. She still has a Higher education careers adviser and PT working with her.  Chest x-ray done today reviewed by me, shows marked improvement in bilateral interstitial infiltrates compared with 02/25/2019.   ROV 10/22/19 --follow-up visit for 86 year old woman with chronic VTE (factor V Leiden deficient), rhinitis, GERD with hiatal hernia, intermittent aspiration/choking with associated chronic cough.  She had COVID-19 pneumonitis in January 2021 and her cough has been labile since that time.  She was treated for possible bronchitis in June with Augmentin.  She reports today that her cough persists, bothers her most mornings and productive of clear thick mucous. She is doing nasal rinses bid and atrovent NS, fluticasone NS, chlorpheniramine, Zyrtec >> expensive.  She stopped guaifenesin, didn't tolerate for some reason. Often gets choked up at meals, trying to practice swallowing precautions.    Review of Systems  Respiratory: Positive for cough.    As per HPI     Objective:   Physical Exam Vitals:   10/22/19 1441  BP: 130/72  Pulse: 93  Temp: (!) 97.1 F (36.2 C)  TempSrc: Temporal  SpO2: 97%  Weight: 128 lb 12.8 oz (58.4 kg)  Height: 5' 3.5" (1.613 m)    Gen:  Pleasant, well-nourished, in no distress,  normal affect  ENT: No lesions,  mouth clear,  oropharynx clear, some hoarse voice  Neck: No JVD,   Lungs: No use of accessory muscles, clear without rales or rhonchi  Cardiovascular: RRR, 3/6 blowing holosystolic M, intact S2  Musculoskeletal: No deformities, no cyanosis or clubbing  Neuro: alert, non focal  Skin: Warm, no lesions or rashes   Current Outpatient Medications:  .  acetaminophen (TYLENOL) 500 MG tablet, Take 1,000 mg by mouth every 6 (six) hours as needed for moderate pain., Disp: , Rfl:  .  atorvastatin (LIPITOR) 10 MG tablet, TAKE 1 TABLET (10 MG TOTAL) BY MOUTH DAILY AT 6 PM., Disp: 90 tablet, Rfl: 2 .  brimonidine (ALPHAGAN) 0.15 % ophthalmic solution, Place 2 drops into both eyes 2 (two) times daily. , Disp: , Rfl: 3 .  Calcium-Phosphorus-Vitamin D (CALCIUM/D3 ADULT GUMMIES) 200-96.6-200 MG-MG-UNIT CHEW, Chew 2 tablets by mouth daily., Disp: , Rfl:  .  cetirizine (ZYRTEC) 10 MG tablet, TAKE 1 TABLET BY MOUTH EVERY DAY, Disp: 90 tablet, Rfl: 3 .  chlorpheniramine (CHLOR-TRIMETON) 4 MG tablet, Take 4 mg by mouth every morning. , Disp: , Rfl:  .  clotrimazole-betamethasone (LOTRISONE) cream, Apply to both feet and between toes bid x 4 weeks., Disp: 45 g, Rfl: 0 .  fluticasone (FLONASE) 50 MCG/ACT nasal spray, SPRAY 2 SPRAYS INTO EACH NOSTRIL EVERY DAY, Disp: 48 mL, Rfl: 1 .  ipratropium (ATROVENT) 0.03 % nasal spray, Place 2 sprays into both nostrils 3 (three) times daily as needed for rhinitis., Disp: 30  mL, Rfl: 6 .  latanoprost (XALATAN) 0.005 % ophthalmic solution, Place 1 drop into both eyes at bedtime. , Disp: , Rfl:  .  levothyroxine (SYNTHROID) 25 MCG tablet, Take 1 tablet (25 mcg total) by mouth daily before breakfast., Disp: 90 tablet, Rfl: 3 .  losartan (COZAAR) 50 MG tablet, TAKE 1 TABLET BY MOUTH EVERY DAY, Disp: 90 tablet, Rfl: 1 .  metoprolol tartrate (LOPRESSOR) 25 MG tablet, TAKE 1 TABLET (25 MG TOTAL) BY MOUTH  2 (TWO) TIMES DAILY. *NEEDS OFFICE VISIT FOR FURTHER REFILLS*, Disp: 180 tablet, Rfl: 2 .  multivitamin-iron-minerals-folic acid (CENTRUM) chewable tablet, Chew 1 tablet by mouth daily., Disp: , Rfl:  .  vitamin E (VITAMIN E) 1000 UNIT capsule, Take 1,000 Units by mouth daily., Disp: , Rfl:  .  warfarin (COUMADIN) 7.5 MG tablet, TAKE 1/2 TO 1 TABLET BY MOUTH DAILY AS DIRECTED, Disp: 90 tablet, Rfl: 1 .  amoxicillin-clavulanate (AUGMENTIN) 400-57 MG/5ML suspension, Take 10 mLs (800 mg total) by mouth 2 (two) times daily., Disp: 140 mL, Rfl: 0 .  benzonatate (TESSALON) 200 MG capsule, Take 1 capsule (200 mg total) by mouth 3 (three) times daily as needed for cough., Disp: 30 capsule, Rfl: 5 .  diclofenac Sodium (VOLTAREN) 1 % GEL, Apply 2 g topically 4 (four) times daily., Disp: 50 g, Rfl: 1 .  loratadine (CLARITIN) 10 MG tablet, Take 1 tablet (10 mg total) by mouth daily., Disp: 30 tablet, Rfl: 11      Assessment & Plan:   COUGH, CHRONIC Most significantly impacted by her chronic rhinitis, thick clear mucus.  She is on a good regimen but the cough persists.  Question whether she may also be having intermittent aspiration.  She is trying to practice swallowing precautions.  She denies any GERD symptoms but if the cough continues going forward then I would favor restarting empiric GERD therapy.  I will continue the same regimen, change her cetirizine to loratadine due to the expense.  Please continue nasal saline rinses twice a day Continue to use your ipratropium nasal spray and fluticasone nasal spray as you have been doing them. Continue your chlorpheniramine tabs as you have been taking it. Try stopping your cetirizine.  Start loratadine 10 mg once daily.  This is affordable at the [facility] and [facility] outpatient pharmacies. Continue to practice your swallowing precautions to avoid aspiration with meals Follow with Dr Lamonte Sakai in 6 months or sooner if you have any problems  Baltazar Apo,  MD, PhD 10/22/2019, 2:58 PM [facility] [phone] or if no answer [phone]

## 2019-11-14 ENCOUNTER — Telehealth: Payer: Self-pay | Admitting: Family Medicine

## 2019-11-14 NOTE — Telephone Encounter (Signed)
Patient called in to speak with Dr.Andy, about an over the counter medicine she started taking, Prevagen. Thayer Headings states that she normally gets it from [facility] but they do not have the regular strength, but has an extra strength -wants to know if it would be safe to take the extra strength.

## 2019-11-18 ENCOUNTER — Other Ambulatory Visit: Payer: Self-pay

## 2019-11-18 ENCOUNTER — Encounter: Payer: Self-pay | Admitting: Family Medicine

## 2019-11-18 ENCOUNTER — Ambulatory Visit (INDEPENDENT_AMBULATORY_CARE_PROVIDER_SITE_OTHER): Payer: Medicare Other | Admitting: Family Medicine

## 2019-11-18 VITALS — BP 116/78 | HR 118 | Temp 97.9°F | Resp 14 | Ht 64.0 in | Wt 126.4 lb

## 2019-11-18 DIAGNOSIS — E538 Deficiency of other specified B group vitamins: Secondary | ICD-10-CM

## 2019-11-18 DIAGNOSIS — I872 Venous insufficiency (chronic) (peripheral): Secondary | ICD-10-CM | POA: Diagnosis not present

## 2019-11-18 DIAGNOSIS — E782 Mixed hyperlipidemia: Secondary | ICD-10-CM | POA: Diagnosis not present

## 2019-11-18 DIAGNOSIS — Z7901 Long term (current) use of anticoagulants: Secondary | ICD-10-CM | POA: Diagnosis not present

## 2019-11-18 DIAGNOSIS — M170 Bilateral primary osteoarthritis of knee: Secondary | ICD-10-CM | POA: Diagnosis not present

## 2019-11-18 DIAGNOSIS — M810 Age-related osteoporosis without current pathological fracture: Secondary | ICD-10-CM | POA: Diagnosis not present

## 2019-11-18 DIAGNOSIS — E039 Hypothyroidism, unspecified: Secondary | ICD-10-CM

## 2019-11-18 DIAGNOSIS — I10 Essential (primary) hypertension: Secondary | ICD-10-CM

## 2019-11-18 DIAGNOSIS — M4010 Other secondary kyphosis, site unspecified: Secondary | ICD-10-CM

## 2019-11-18 DIAGNOSIS — Z23 Encounter for immunization: Secondary | ICD-10-CM | POA: Diagnosis not present

## 2019-11-18 DIAGNOSIS — D509 Iron deficiency anemia, unspecified: Secondary | ICD-10-CM

## 2019-11-18 NOTE — Progress Notes (Signed)
Subjective  CC:  Chief Complaint  Patient presents with  . Hypertension  . Medication Management    phone note 11/14/19    HPI: Robin Harrell is a 86 y.o. female who presents to the office today to address the problems listed above in the chief complaint.  Hypertension f/u: Control is fair . Pt reports she is doing well. taking medications as instructed, no medication side effects noted, no TIAs, no chest pain on exertion, no dyspnea on exertion, no swelling of ankles. She denies adverse effects from his BP medications. Compliance with medication is good.   Follow-up chronic medical problems including hyperlipidemia, hypothyroidism, osteoporosis, chronic leg edema.  Overall she continues to do well.  She is feeling much better than she did after her Covid pneumonitis.  Her breathing is back to normal.  I reviewed pulmonology recent office visit and notes.  Repeat chest x-ray done last month showed clearing of her interstitial lung changes.  She feels her breathing is back to baseline.  She does have chronic cough that persists.  Her blood pressure is doing well on her medications.  She reports good energy and fair sleep except for getting up for nocturia.  She takes her thyroid medicines daily and is due for recheck.  He has been well controlled.  She tolerates her statin.  No bleeding complications from her long-term anticoagulation.  We will follow-up with her mild anemia that was noted on March his lab work with mildly low iron levels and B12 levels.  She did not remember to take the supplements.  She denies chest pain, shortness of breath or falls.  Chronic osteoporosis with kyphosis: Continues to walk and drive.  She is careful to avoid falls.  She is due for bone density but we will defer this till after Covid.  Continue on Prolia.  No adverse effects  Complains of left knee pain over the last several weeks.  No injury.  No redness or warmth.  Mild swelling present.  Assessment  1.  Essential hypertension   2. Vitamin B12 deficiency   3. Iron deficiency anemia, unspecified iron deficiency anemia type   4. Long term (current) use of anticoagulants   5. Mixed hyperlipidemia   6. Hypothyroidism (acquired)   7. Age-related osteoporosis without current pathological fracture   8. Chronic venous stasis dermatitis of both lower extremities   9. 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 Primary osteoarthritis of both knees      Plan    Hypertension f/u: BP control is well controlled.  Check renal function and electrolytes.  No changes in medications made today  Hyperlipidemia f/u: Recheck nonfasting lipids on statin.  Monitor LFTs  Recheck anemia, iron levels and B12 levels.  Will start oral supplements if remains low.  Hypothyroidism has been controlled.  Clinically euthyroid.  Recheck TSH today.  Discussed osteoporosis follow-up, has been on Prolia for the last 2 years.  Will defer testing till next year after Covid surge hopefully has improved.  She will get booster soon.  Osteoarthritis, left knee active.  Start Voltaren gel 3 times daily 4 times daily as needed  Education regarding management of these chronic disease states was given. Management strategies discussed on successive visits include dietary and exercise recommendations, goals of achieving and maintaining IBW, and lifestyle modifications aiming for adequate sleep and minimizing stressors.   Follow up: 6 months for recheck  Orders Placed This Encounter  Procedures  . CBC with Differential/Platelet  . COMPLETE METABOLIC  PANEL WITH GFR  . Lipid panel  . TSH  . Iron, TIBC and Ferritin Panel  . Vitamin B12   No orders of the defined types were placed in this encounter.     BP Readings from Last 3 Encounters:  11/18/19 116/78  10/22/19 130/72  08/22/19 (!) 142/82   Wt Readings from Last 3 Encounters:  11/18/19 126 lb 6.4 oz (57.3 kg)  10/22/19 128 lb 12.8 oz (58.4 kg)  08/19/19 126 lb 12.8 oz (57.5  kg)    Lab Results  Component Value Date   CHOL 168 01/29/2019   CHOL 159 01/30/2018   CHOL 170 11/20/2012   Lab Results  Component Value Date   HDL 80.40 01/29/2019   HDL 69.90 01/30/2018   HDL 75 11/20/2012   Lab Results  Component Value Date   LDLCALC 77 01/29/2019   LDLCALC 76 01/30/2018   LDLCALC 79 11/20/2012   Lab Results  Component Value Date   TRIG 60 02/25/2019   TRIG 53.0 01/29/2019   TRIG 68.0 01/30/2018   Lab Results  Component Value Date   CHOLHDL 2 01/29/2019   CHOLHDL 2 01/30/2018   CHOLHDL 2.3 11/20/2012   No results found for: LDLDIRECT Lab Results  Component Value Date   CREATININE 0.99 05/16/2019   BUN 20 05/16/2019   NA 137 05/16/2019   K 4.2 05/16/2019   CL 103 05/16/2019   CO2 29 05/16/2019    The ASCVD Risk score (Goff DC Jr., et al., 2013) failed to calculate for the following reasons:   The 2013 ASCVD risk score is only valid for ages 38 to 60  I reviewed the patients updated PMH, FH, and SocHx.    Patient Active Problem List   Diagnosis Date Noted  . Hypothyroidism (acquired) 09/02/2014    Priority: High  . Hyperlipidemia 11/19/2012    Priority: High  . Long term (current) use of anticoagulants 05/10/2012    Priority: High  . Diastolic dysfunction 35/36/1443    Priority: High  . Factor V deficiency ([place]) 02/09/2010    Priority: High  . Osteoporosis 02/09/2010    Priority: High  . Essential hypertension 11/15/2007    Priority: High  . Chronic venous stasis dermatitis of both lower extremities 12/31/2018    Priority: Medium  . Aortic stenosis, mild 02/07/2017    Priority: Medium  . Chronic idiopathic thrombocytopenia ([facility]) 04/14/2016    Priority: Medium  . Essential tremor 09/03/2015    Priority: Medium  . Edema of right lower extremity 02/01/2013    Priority: Medium  . Colon polyp 10/15/2012    Priority: Medium  . Insomnia 10/11/2011    Priority: Medium  . Osteoarthritis, multiple sites 06/30/2011    Priority:  Medium  . DJD (degenerative joint disease), lumbar 10/14/2009    Priority: Medium  . Glaucoma 01/29/2008    Priority: Medium  . History of DVT (deep vein thrombosis) 11/15/2007    Priority: Medium  . GERD 11/15/2007    Priority: Medium  . COUGH, CHRONIC 11/15/2007    Priority: Medium  . 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 History of recurrent UTIs 01/26/2017    Priority: Low  . History of respiratory failure 11/11/2014    Priority: Low  . Seasonal and perennial allergic rhinitis 11/15/2007    Priority: Low  . Primary osteoarthritis of both knees 11/18/2019  . Skin ulcer of toe of right foot, limited to breakdown of skin ([place]) 05/09/2019  .  Acute respiratory disease due to COVID-19 virus 02/26/2019  . Pneumonia due to COVID-19 virus 02/26/2019  . Acute respiratory failure due to COVID-19 ([place]) 02/25/2019    Allergies: Azithromycin, Codeine, and Erythromycin  Social History: Patient  reports that she quit smoking about 61 years ago. Her smoking use included cigarettes. She quit after 1.00 year of use. She has never used smokeless tobacco. She reports current alcohol use. She reports that she does not use drugs.  Current Meds  Medication Sig  . acetaminophen (TYLENOL) 500 MG tablet Take 1,000 mg by mouth every 6 (six) hours as needed for moderate pain.  Marland Kitchen atorvastatin (LIPITOR) 10 MG tablet TAKE 1 TABLET (10 MG TOTAL) BY MOUTH DAILY AT 6 PM.  . brimonidine (ALPHAGAN) 0.15 % ophthalmic solution Place 2 drops into both eyes 2 (two) times daily.   . Calcium-Phosphorus-Vitamin D (CALCIUM/D3 ADULT GUMMIES) 200-96.6-200 MG-MG-UNIT CHEW Chew 2 tablets by mouth daily.  . chlorpheniramine (CHLOR-TRIMETON) 4 MG tablet Take 4 mg by mouth every morning.   . fluticasone (FLONASE) 50 MCG/ACT nasal spray SPRAY 2 SPRAYS INTO EACH NOSTRIL EVERY DAY  . ipratropium (ATROVENT) 0.03 % nasal spray Place 2 sprays into both nostrils 3 (three) times daily as needed for rhinitis.    Marland Kitchen latanoprost (XALATAN) 0.005 % ophthalmic solution Place 1 drop into both eyes at bedtime.   Marland Kitchen levothyroxine (SYNTHROID) 25 MCG tablet Take 1 tablet (25 mcg total) by mouth daily before breakfast.  . loratadine (CLARITIN) 10 MG tablet Take 1 tablet (10 mg total) by mouth daily.  Marland Kitchen losartan (COZAAR) 50 MG tablet TAKE 1 TABLET BY MOUTH EVERY DAY  . metoprolol tartrate (LOPRESSOR) 25 MG tablet TAKE 1 TABLET (25 MG TOTAL) BY MOUTH 2 (TWO) TIMES DAILY. *NEEDS OFFICE VISIT FOR FURTHER REFILLS*  . multivitamin-iron-minerals-folic acid (CENTRUM) chewable tablet Chew 1 tablet by mouth daily.  . vitamin E (VITAMIN E) 1000 UNIT capsule Take 1,000 Units by mouth daily.  Marland Kitchen warfarin (COUMADIN) 7.5 MG tablet TAKE 1/2 TO 1 TABLET BY MOUTH DAILY AS DIRECTED    Review of Systems: Cardiovascular: negative for chest pain, palpitations, leg swelling, orthopnea Respiratory: negative for SOB, wheezing or persistent cough Gastrointestinal: negative for abdominal pain Genitourinary: negative for dysuria or gross hematuria  Objective  Vitals: BP 116/78   Pulse (!) 118   Temp 97.9 F (36.6 C) (Temporal)   Resp 14   Ht 5\' 4"  (1.626 m)   Wt 126 lb 6.4 oz (57.3 kg)   LMP  (LMP Unknown)   SpO2 97%   BMI 21.70 kg/m  General: no acute distress  Psych:  Alert and oriented, normal mood and affect HEENT:  Normocephalic, atraumatic, supple neck  Cardiovascular:  RRR with murmur. Tr Bil edema Respiratory:  Good breath sounds bilaterally, CTAB with normal respiratory effort Skin:  Warm, no rashes Left knee with crepitus, no effusion or warmth. Limited extension.  Neurologic:   Mental status is normal  Commons side effects, risks, benefits, and alternatives for medications and treatment plan prescribed today were discussed, and the patient expressed understanding of the given instructions. Patient is instructed to call or message via MyChart if he/she has any questions or concerns regarding our treatment plan. No  barriers to understanding were identified. We discussed Red Flag symptoms and signs in detail. Patient expressed understanding regarding what to do in case of urgent or emergency type symptoms.   Medication list was reconciled, printed and provided to the patient in AVS. Patient instructions and summary information  was reviewed with the patient as documented in the AVS. This note was prepared with assistance of Dragon voice recognition software. Occasional wrong-word or sound-a-like substitutions may have occurred due to the inherent limitations of voice recognition software  This visit occurred during the SARS-CoV-2 public health emergency.  Safety protocols were in place, including screening questions prior to the visit, additional usage of staff PPE, and extensive cleaning of exam room while observing appropriate contact time as indicated for disinfecting solutions.

## 2019-11-18 NOTE — Patient Instructions (Signed)
Please return in 6 months for recheck.   I will release your lab results to you on your MyChart account with further instructions. Please reply with any questions.  We will call to start iron and/or vitamin B12 supplements if needed.   If you have any questions or concerns, please don't hesitate to send me a message via MyChart or call the office at [phone]. Thank you for visiting with Robin Harrell today! It's our pleasure caring for you.

## 2019-11-19 LAB — CBC WITH DIFFERENTIAL/PLATELET
Absolute Monocytes: 494 cells/uL (ref 200–950)
Basophils Absolute: 38 cells/uL (ref 0–200)
Basophils Relative: 0.8 %
Eosinophils Absolute: 72 cells/uL (ref 15–500)
Eosinophils Relative: 1.5 %
HCT: 35.3 % (ref 35.0–45.0)
Hemoglobin: 11.6 g/dL — ABNORMAL LOW (ref 11.7–15.5)
Lymphs Abs: 1013 cells/uL (ref 850–3900)
MCH: 30.9 pg (ref 27.0–33.0)
MCHC: 32.9 g/dL (ref 32.0–36.0)
MCV: 93.9 fL (ref 80.0–100.0)
MPV: 11.5 fL (ref 7.5–12.5)
Monocytes Relative: 10.3 %
Neutro Abs: 3182 cells/uL (ref 1500–7800)
Neutrophils Relative %: 66.3 %
Platelets: 136 10*3/uL — ABNORMAL LOW (ref 140–400)
RBC: 3.76 10*6/uL — ABNORMAL LOW (ref 3.80–5.10)
RDW: 12 % (ref 11.0–15.0)
Total Lymphocyte: 21.1 %
WBC: 4.8 10*3/uL (ref 3.8–10.8)

## 2019-11-19 LAB — COMPLETE METABOLIC PANEL WITH GFR
AG Ratio: 1.7 (calc) (ref 1.0–2.5)
ALT: 15 U/L (ref 6–29)
AST: 23 U/L (ref 10–35)
Albumin: 4 g/dL (ref 3.6–5.1)
Alkaline phosphatase (APISO): 60 U/L (ref 37–153)
BUN/Creatinine Ratio: 24 (calc) — ABNORMAL HIGH (ref 6–22)
BUN: 25 mg/dL (ref 7–25)
CO2: 26 mmol/L (ref 20–32)
Calcium: 8.8 mg/dL (ref 8.6–10.4)
Chloride: 103 mmol/L (ref 98–110)
Creat: 1.05 mg/dL — ABNORMAL HIGH (ref 0.60–0.88)
GFR, Est African American: 57 mL/min/{1.73_m2} — ABNORMAL LOW (ref >=60)
GFR, Est Non African American: 49 mL/min/{1.73_m2} — ABNORMAL LOW (ref >=60)
Globulin: 2.3 g/dL (calc) (ref 1.9–3.7)
Glucose, Bld: 87 mg/dL (ref 65–99)
Potassium: 4.4 mmol/L (ref 3.5–5.3)
Sodium: 137 mmol/L (ref 135–146)
Total Bilirubin: 0.6 mg/dL (ref 0.2–1.2)
Total Protein: 6.3 g/dL (ref 6.1–8.1)

## 2019-11-19 LAB — VITAMIN B12: Vitamin B-12: 664 pg/mL (ref 200–1100)

## 2019-11-19 LAB — LIPID PANEL
Cholesterol: 158 mg/dL (ref <200)
HDL: 79 mg/dL (ref >=50)
LDL Cholesterol (Calc): 65 mg/dL (calc)
Non-HDL Cholesterol (Calc): 79 mg/dL (calc) (ref <130)
Total CHOL/HDL Ratio: 2 (calc) (ref <5.0)
Triglycerides: 60 mg/dL (ref <150)

## 2019-11-19 LAB — IRON,TIBC AND FERRITIN PANEL
%SAT: 18 % (calc) (ref 16–45)
Ferritin: 75 ng/mL (ref 16–288)
Iron: 61 ug/dL (ref 45–160)
TIBC: 348 mcg/dL (calc) (ref 250–450)

## 2019-11-19 LAB — TSH: TSH: 2.4 mIU/L (ref 0.40–4.50)

## 2019-11-19 NOTE — Progress Notes (Signed)
Please call patient: I have reviewed his/her lab results. Lab results are stable. Mild anemia and mildly low iron levels. Rec otc iron pill one pill daily.  No other changes needed at this time.

## 2019-11-25 ENCOUNTER — Other Ambulatory Visit: Payer: Self-pay

## 2019-11-25 ENCOUNTER — Ambulatory Visit (INDEPENDENT_AMBULATORY_CARE_PROVIDER_SITE_OTHER): Payer: Medicare Other

## 2019-11-25 DIAGNOSIS — Z7901 Long term (current) use of anticoagulants: Secondary | ICD-10-CM

## 2019-11-25 LAB — POCT INR: INR: 2.3 (ref 2.0–3.0)

## 2019-11-25 NOTE — Patient Instructions (Signed)
Continue taking 1 tablet daily except for 1/2 tablet each Monday, Wednesday and Friday.  Repeat INR in 6 weeks.   

## 2019-12-05 ENCOUNTER — Ambulatory Visit (INDEPENDENT_AMBULATORY_CARE_PROVIDER_SITE_OTHER): Payer: Medicare Other

## 2019-12-05 DIAGNOSIS — Z0000 Encounter for general adult medical examination without abnormal findings: Secondary | ICD-10-CM

## 2019-12-05 NOTE — Patient Instructions (Signed)
Robin Harrell , Thank you for taking time to come for your Medicare Wellness Visit. I appreciate your ongoing commitment to your health goals. Please review the following plan we discussed and let me know if I can assist you in the future.   Screening recommendations/referrals: Colonoscopy: No longer required Mammogram: No longer required Bone Density: Done 09/29/17 Recommended yearly ophthalmology/optometry visit for glaucoma screening and checkup Recommended yearly dental visit for hygiene and checkup  Vaccinations: Influenza vaccine: Done 11/18/19 Pneumococcal vaccine: Up to date Tdap vaccine: Discontinued   Shingles vaccine: Completed  07/02/16 & 01/27/17 Covid-19:Completed 06/03/19 & 06/17/19  Advanced directives: Copies in chart  Conditions/risks identified: To get rid of Cane   Next appointment: Follow up in one year for your annual wellness visit   Preventive Care 65 Years and Older, Female Preventive care refers to lifestyle choices and visits with your health care provider that can promote health and wellness. What does preventive care include?  A yearly physical exam. This is also called an annual well check.  Dental exams once or twice a year.  Routine eye exams. Ask your health care provider how often you should have your eyes checked.  Personal lifestyle choices, including:  Daily care of your teeth and gums.  Regular physical activity.  Eating a healthy diet.  Avoiding tobacco and drug use.  Limiting alcohol use.  Practicing safe sex.  Taking low-dose aspirin every day.  Taking vitamin and mineral supplements as recommended by your health care provider. What happens during an annual well check? The services and screenings done by your health care provider during your annual well check will depend on your age, overall health, lifestyle risk factors, and family history of disease. Counseling  Your health care provider may ask you questions about  your:  Alcohol use.  Tobacco use.  Drug use.  Emotional well-being.  Home and relationship well-being.  Sexual activity.  Eating habits.  History of falls.  Memory and ability to understand (cognition).  Work and work Statistician.  Reproductive health. Screening  You may have the following tests or measurements:  Height, weight, and BMI.  Blood pressure.  Lipid and cholesterol levels. These may be checked every 5 years, or more frequently if you are over 4 years old.  Skin check.  Lung cancer screening. You may have this screening every year starting at age 44 if you have a 30-pack-year history of smoking and currently smoke or have quit within the past 15 years.  Fecal occult blood test (FOBT) of the stool. You may have this test every year starting at age 38.  Flexible sigmoidoscopy or colonoscopy. You may have a sigmoidoscopy every 5 years or a colonoscopy every 10 years starting at age 42.  Hepatitis C blood test.  Hepatitis B blood test.  Sexually transmitted disease (STD) testing.  Diabetes screening. This is done by checking your blood sugar (glucose) after you have not eaten for a while (fasting). You may have this done every 1-3 years.  Bone density scan. This is done to screen for osteoporosis. You may have this done starting at age 73.  Mammogram. This may be done every 1-2 years. Talk to your health care provider about how often you should have regular mammograms. Talk with your health care provider about your test results, treatment options, and if necessary, the need for more tests. Vaccines  Your health care provider may recommend certain vaccines, such as:  Influenza vaccine. This is recommended every year.  Tetanus, diphtheria,  and acellular pertussis (Tdap, Td) vaccine. You may need a Td booster every 10 years.  Zoster vaccine. You may need this after age 95.  Pneumococcal 13-valent conjugate (PCV13) vaccine. One dose is recommended  after age 56.  Pneumococcal polysaccharide (PPSV23) vaccine. One dose is recommended after age 57. Talk to your health care provider about which screenings and vaccines you need and how often you need them. This information is not intended to replace advice given to you by your health care provider. Make sure you discuss any questions you have with your health care provider. Document Released: 03/06/2015 Document Revised: 10/28/2015 Document Reviewed: 12/09/2014 Elsevier Interactive Patient Education  2017 [employer] Prevention in the Home Falls can cause injuries. They can happen to people of all ages. There are many things you can do to make your home safe and to help prevent falls. What can I do on the outside of my home?  Regularly fix the edges of walkways and driveways and fix any cracks.  Remove anything that might make you trip as you walk through a door, such as a raised step or threshold.  Trim any bushes or trees on the path to your home.  Use bright outdoor lighting.  Clear any walking paths of anything that might make someone trip, such as rocks or tools.  Regularly check to see if handrails are loose or broken. Make sure that both sides of any steps have handrails.  Any raised decks and porches should have guardrails on the edges.  Have any leaves, snow, or ice cleared regularly.  Use sand or salt on walking paths during winter.  Clean up any spills in your garage right away. This includes oil or grease spills. What can I do in the bathroom?  Use night lights.  Install grab bars by the toilet and in the tub and shower. Do not use towel bars as grab bars.  Use non-skid mats or decals in the tub or shower.  If you need to sit down in the shower, use a plastic, non-slip stool.  Keep the floor dry. Clean up any water that spills on the floor as soon as it happens.  Remove soap buildup in the tub or shower regularly.  Attach bath mats securely with  double-sided non-slip rug tape.  Do not have throw rugs and other things on the floor that can make you trip. What can I do in the bedroom?  Use night lights.  Make sure that you have a light by your bed that is easy to reach.  Do not use any sheets or blankets that are too big for your bed. They should not hang down onto the floor.  Have a firm chair that has side arms. You can use this for support while you get dressed.  Do not have throw rugs and other things on the floor that can make you trip. What can I do in the kitchen?  Clean up any spills right away.  Avoid walking on wet floors.  Keep items that you use a lot in easy-to-reach places.  If you need to reach something above you, use a strong step stool that has a grab bar.  Keep electrical cords out of the way.  Do not use floor polish or wax that makes floors slippery. If you must use wax, use non-skid floor wax.  Do not have throw rugs and other things on the floor that can make you trip. What can I do with my  stairs?  Do not leave any items on the stairs.  Make sure that there are handrails on both sides of the stairs and use them. Fix handrails that are broken or loose. Make sure that handrails are as long as the stairways.  Check any carpeting to make sure that it is firmly attached to the stairs. Fix any carpet that is loose or worn.  Avoid having throw rugs at the top or bottom of the stairs. If you do have throw rugs, attach them to the floor with carpet tape.  Make sure that you have a light switch at the top of the stairs and the bottom of the stairs. If you do not have them, ask someone to add them for you. What else can I do to help prevent falls?  Wear shoes that:  Do not have high heels.  Have rubber bottoms.  Are comfortable and fit you well.  Are closed at the toe. Do not wear sandals.  If you use a stepladder:  Make sure that it is fully opened. Do not climb a closed stepladder.  Make  sure that both sides of the stepladder are locked into place.  Ask someone to hold it for you, if possible.  Clearly mark and make sure that you can see:  Any grab bars or handrails.  First and last steps.  Where the edge of each step is.  Use tools that help you move around (mobility aids) if they are needed. These include:  Canes.  Walkers.  Scooters.  Crutches.  Turn on the lights when you go into a dark area. Replace any light bulbs as soon as they burn out.  Set up your furniture so you have a clear path. Avoid moving your furniture around.  If any of your floors are uneven, fix them.  If there are any pets around you, be aware of where they are.  Review your medicines with your doctor. Some medicines can make you feel dizzy. This can increase your chance of falling. Ask your doctor what other things that you can do to help prevent falls. This information is not intended to replace advice given to you by your health care provider. Make sure you discuss any questions you have with your health care provider. Document Released: 12/04/2008 Document Revised: 07/16/2015 Document Reviewed: 03/14/2014 Elsevier Interactive Patient Education  2017 [employer].

## 2019-12-05 NOTE — Progress Notes (Signed)
Virtual Visit via Telephone Note  I connected with  Robin Harrell on 12/05/19 at  3:15 PM EDT by telephone and verified that I am speaking with the correct person using two identifiers.  Medicare Annual Wellness visit completed telephonically due to Covid-19 pandemic.   Persons participating in this call: This Health Coach and this patient.   Location: Patient: Home Provider: Office   I discussed the limitations, risks, security and privacy concerns of performing an evaluation and management service by telephone and the availability of in person appointments. The patient expressed understanding and agreed to proceed.  Unable to perform video visit due to video visit attempted and failed and/or patient does not have video capability.   Some vital signs may be absent or patient reported.   Willette Brace, LPN   Subjective:   Robin Harrell is a 86 y.o. female who presents for Medicare Annual (Subsequent) preventive examination.  Review of Systems     Cardiac Risk Factors include: advanced age (>53men, >75 women);dyslipidemia;hypertension     Objective:    There were no vitals filed for this visit. There is no height or weight on file to calculate BMI.  Advanced Directives 12/05/2019 02/26/2019 02/25/2019 11/23/2018 08/10/2018 11/09/2017 03/23/2016  Does Patient Have a Medical Advance Directive? Yes No No Yes No Yes No  Type of Paramedic of [place];Living will - - Living will;Healthcare Power of [place];Living will -  Does patient want to make changes to medical advance directive? - - - No - Patient declined - - -  Copy of [place] in Chart? Yes - validated most recent copy scanned in chart (See row information) - - Yes - validated most recent copy scanned in chart (See row information) - Yes -  Would patient like information on creating a medical advance directive? - No - Patient declined - - No -  Patient declined - No - Patient declined    Current Medications (verified) Outpatient Encounter Medications as of 12/05/2019  Medication Sig  . acetaminophen (TYLENOL) 500 MG tablet Take 1,000 mg by mouth every 6 (six) hours as needed for moderate pain.  Marland Kitchen atorvastatin (LIPITOR) 10 MG tablet TAKE 1 TABLET (10 MG TOTAL) BY MOUTH DAILY AT 6 PM.  . brimonidine (ALPHAGAN) 0.15 % ophthalmic solution Place 2 drops into both eyes 2 (two) times daily.   . Calcium-Phosphorus-Vitamin D (CALCIUM/D3 ADULT GUMMIES) 200-96.6-200 MG-MG-UNIT CHEW Chew 2 tablets by mouth daily.  . chlorpheniramine (CHLOR-TRIMETON) 4 MG tablet Take 4 mg by mouth every morning.   . fluticasone (FLONASE) 50 MCG/ACT nasal spray SPRAY 2 SPRAYS INTO EACH NOSTRIL EVERY DAY  . ipratropium (ATROVENT) 0.03 % nasal spray Place 2 sprays into both nostrils 3 (three) times daily as needed for rhinitis.  Marland Kitchen latanoprost (XALATAN) 0.005 % ophthalmic solution Place 1 drop into both eyes at bedtime.   Marland Kitchen levothyroxine (SYNTHROID) 25 MCG tablet Take 1 tablet (25 mcg total) by mouth daily before breakfast.  . loratadine (CLARITIN) 10 MG tablet Take 1 tablet (10 mg total) by mouth daily.  Marland Kitchen losartan (COZAAR) 50 MG tablet TAKE 1 TABLET BY MOUTH EVERY DAY  . metoprolol tartrate (LOPRESSOR) 25 MG tablet TAKE 1 TABLET (25 MG TOTAL) BY MOUTH 2 (TWO) TIMES DAILY. *NEEDS OFFICE VISIT FOR FURTHER REFILLS*  . multivitamin-iron-minerals-folic acid (CENTRUM) chewable tablet Chew 1 tablet by mouth daily.  . vitamin E (VITAMIN E) 1000 UNIT capsule Take 1,000 Units by mouth  daily.  . warfarin (COUMADIN) 7.5 MG tablet TAKE 1/2 TO 1 TABLET BY MOUTH DAILY AS DIRECTED  . [DISCONTINUED] cetirizine (ZYRTEC) 10 MG tablet TAKE 1 TABLET BY MOUTH EVERY DAY (Patient not taking: Reported on 11/18/2019)   No facility-administered encounter medications on file as of 12/05/2019.    Allergies (verified) Azithromycin, Codeine, and Erythromycin   History: Past Medical  History:  Diagnosis Date  . Allergic rhinitis   . Cough   . Difficulty in swallowing    w/ occasional aspiration  . DVT (deep venous thrombosis) ([place])   . Dyslipidemia   . Factor V Leiden deficiency    lifelong coumadin  . GERD (gastroesophageal reflux disease)   . History of nuclear stress test 12/28/2007   lexiscan; low risk   . HTN (hypertension)   . PND (post-nasal drip)   . Pulmonary embolism ([place])   . PVC's (premature ventricular contractions)   . Thyroid disease    Past Surgical History:  Procedure Laterality Date  . APPENDECTOMY    . BUNIONECTOMY    . CATARACT EXTRACTION W/ INTRAOCULAR LENS IMPLANT Right 11/02/2013  . CHOLECYSTECTOMY  1973  . ESOPHAGEAL DILATION    . HAMMER TOE SURGERY    . history of sleep study  12/27/2007   AHI during total sleep 0.9/hr and REM 1.5/hr; RDI during total sleep6.0/hr and REM 6.1/hr  . 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 TRANSTHORACIC ECHOCARDIOGRAM  12/28/2007   borderline conc LVH with normal systolic function; MV mod thickened with mild MVP & mild MR   Family History  Problem Relation Age of Onset  . Lung cancer Father   . Cirrhosis Father   . Coronary artery disease Maternal Grandfather        MI  . Throat cancer Maternal Grandfather   . Heart disease Paternal Grandmother   . Coronary artery disease Paternal Grandfather        MI  . Throat cancer Paternal Grandfather   . Allergic rhinitis Sister   . Skin cancer Brother   . Breast cancer Daughter 62   Social History   Socioeconomic History  . Marital status: Married    Spouse name: Not on file  . Number of children: 4  . Years of education: 13.5  . Highest education level: Not on file  Occupational History  . Occupation: Network engineer - retired  Tobacco Use  . Smoking status: Former Smoker    Years: 1.00    Types: Cigarettes    Quit date: 02/21/1958    Years since quitting: 61.8  . Smokeless tobacco: Never Used  Vaping Use  . Vaping Use: Never used    Substance and Sexual Activity  . Alcohol use: Yes    Comment: rareley one glass of wine  . Drug use: No  . Sexual activity: Never  Other Topics Concern  . Not on file  Social History Narrative  . Not on file   Social Determinants of Health   Financial Resource Strain: Low Risk   . Difficulty of Paying Living Expenses: Not hard at all  Food Insecurity: No Food Insecurity  . Worried About Charity fundraiser in the Last Year: Never true  . Ran Out of Food in the Last Year: Never true  Transportation Needs: No Transportation Needs  . Lack of Transportation (Medical): No  . Lack of Transportation (Non-Medical): No  Physical Activity: Inactive  . Days of Exercise per Week: 0 days  . Minutes of Exercise per  Session: 0 min  Stress: No Stress Concern Present  . Feeling of Stress : Not at all  Social Connections: Socially Integrated  . Frequency of Communication with Friends and Family: Twice a week  . Frequency of Social Gatherings with Friends and Family: Three times a week  . Attends Religious Services: More than 4 times per year  . Active Member of Clubs or Organizations: Yes  . Attends Archivist Meetings: 1 to 4 times per year  . Marital Status: Married    Tobacco Counseling Counseling given: Not Answered   Clinical Intake:  Pre-visit preparation completed: Yes  Pain : No/denies pain     BMI - recorded: 21.7 Nutritional Status: BMI of 19-24  Normal Nutritional Risks: None Diabetes: No  How often do you need to have someone help you when you read instructions, pamphlets, or other written materials from your doctor or pharmacy?: 1 - Never  Diabetic?No  Interpreter Needed?: No  Information entered by :: Charlott Rakes, LPN   Activities of Daily Living In your present state of health, do you have any difficulty performing the following activities: 12/05/2019 02/26/2019  Hearing? N N  Vision? N N  Difficulty concentrating or making decisions? N N   Walking or climbing stairs? Y Y  Dressing or bathing? N Y  Doing errands, shopping? N N  Preparing Food and eating ? N -  Using the Toilet? N -  In the past six months, have you accidently leaked urine? Y -  Comment wears depends and pads for accidents -  Do you have problems with loss of bowel control? N -  Managing your Medications? N -  Managing your Finances? N -  Housekeeping or managing your Housekeeping? N -  Some recent data might be hidden    Patient Care Team: [name], MD as PCP - General (Family Medicine) Collene Gobble, MD as Consulting Physician (Pulmonary Disease) Lorretta Harp, MD as Consulting Physician (Cardiology) Harriett Sine, MD as Consulting Physician (Dermatology) Katy Apo, MD as Consulting Physician (Ophthalmology) Marzetta Board, DPM as Consulting Physician (Podiatry)  Indicate any recent Medical Services you may have received from other than [facility] providers in the past year (date may be approximate).     Assessment:   This is a routine wellness examination for Gayanne.  Hearing/Vision screen  Hearing Screening   125Hz  250Hz  500Hz  1000Hz  2000Hz  3000Hz  4000Hz  6000Hz  8000Hz   Right ear:           Left ear:           Comments: Pt denies any difficulty hearing at times  Vision Screening Comments: Pt follows up with Dr Katy Apo annually for eye exams  Dietary issues and exercise activities discussed: Current Exercise Habits: Structured exercise class (thi chi mon and friday), Time (Minutes): 45, Frequency (Times/Week): 2, Weekly Exercise (Minutes/Week): 90  Goals    . Patient Stated     Start [place] chi again, improve balance.     . Patient Stated     Walk with out cane       Depression Screen PHQ 2/9 Scores 12/05/2019 11/23/2018 08/02/2018 11/09/2017 01/26/2017  PHQ - 2 Score 0 0 0 0 0    Fall Risk Fall Risk  12/05/2019 02/25/2019 11/23/2018 10/17/2018 08/02/2018  Falls in the past year? 0 1 1 0 0  Comment - - - - -   Number falls in past yr: 0 0 0 0 0  Injury with Fall? 0 1  1 1 0  Risk Factor Category  - - - - -  Risk for fall due to : Impaired balance/gait;Impaired mobility;Impaired vision History of fall(s) Impaired balance/gait;Impaired mobility History of fall(s);Impaired mobility;Impaired balance/gait -  Risk for fall due to: Comment use glasses for driving - - - -  Follow up Falls prevention discussed Education provided Education provided;Falls prevention discussed;Falls evaluation completed Falls evaluation completed Falls evaluation completed    Any stairs in or around the home? Yes  If so, are there any without handrails? No  Home free of loose throw rugs in walkways, pet beds, electrical cords, etc? Yes  Adequate lighting in your home to reduce risk of falls? Yes   ASSISTIVE DEVICES UTILIZED TO PREVENT FALLS:  Life alert? Yes  Use of a cane, walker or w/c? Yes  Grab bars in the bathroom? Yes  Shower chair or bench in shower? Yes  Elevated toilet seat or a handicapped toilet? Yes   TIMED UP AND GO:  Was the test performed? No .      Cognitive Function: MMSE - Mini Mental State Exam 11/09/2017  Orientation to time 5  Orientation to Place 5  Registration 3  Attention/ Calculation 5  Recall 2  Language- name 2 objects 2  Language- repeat 1  Language- follow 3 step command 3  Language- read & follow direction 1  Write a sentence 1  Copy design 1  Total score 29     6CIT Screen 12/05/2019  What Year? 0 points  What month? 0 points  Count back from 20 0 points  Months in reverse 0 points  Repeat phrase 0 points    Immunizations Immunization History  Administered Date(s) Administered  . Fluad Quad(high Dose 65+) 10/17/2018, 11/18/2019  . Influenza Split 11/21/2008, 02/09/2010, 10/24/2011, 11/21/2012  . Influenza Whole 11/27/2008  . Influenza, High Dose Seasonal PF 11/12/2011, 11/29/2012, 11/05/2013, 01/05/2015, 11/16/2015, 10/21/2016, 11/09/2017  . Influenza,inj,Quad  PF,6+ Mos 11/23/2015  . Influenza-Unspecified 02/02/2011  . PFIZER SARS-COV-2 Vaccination 06/03/2019, 06/17/2019  . Pneumococcal Conjugate-13 04/23/2013  . Pneumococcal Polysaccharide-23 02/21/2005  . Tdap 09/22/2007  . Zoster 04/22/2010  . Zoster Recombinat (Shingrix) 07/11/2016, 01/27/2017    TDAP status: Due, Education has been provided regarding the importance of this vaccine. Advised may receive this vaccine at local pharmacy or Health Dept. Aware to provide a copy of the vaccination record if obtained from local pharmacy or Health Dept. Verbalized acceptance and understanding. Flu Vaccine status: Up to date Pneumococcal vaccine status: Up to date Covid-19 vaccine status: Completed vaccines  Qualifies for Shingles Vaccine? Yes   Zostavax completed Yes   Shingrix Completed?: Yes  Screening Tests Health Maintenance  Topic Date Due  . DEXA SCAN  05/16/2020 (Originally 09/30/2019)  . INFLUENZA VACCINE  Completed  . COVID-19 Vaccine  Completed  . PNA vac Low Risk Adult  Completed  . TETANUS/TDAP  Discontinued    Health Maintenance  There are no preventive care reminders to display for this patient.  Colorectal cancer screening: No longer required.  Mammogram status: No longer required.  Bone Density status: Completed 09/29/17. Results reflect: Bone density results: OSTEOPOROSIS. Repeat every 2 years.   Additional Screening:    Vision Screening: Recommended annual ophthalmology exams for early detection of glaucoma and other disorders of the eye. Is the patient up to date with their annual eye exam?  Yes  Who is the provider or what is the name of the office in which the patient attends annual eye exams? Dr Prudencio Burly  Dental Screening: Recommended annual dental exams for proper oral hygiene  Community Resource Referral / Chronic Care Management: CRR required this visit?  No   CCM required this visit?  No      Plan:     I have personally reviewed and noted the  following in the patient's chart:   . Medical and social history . Use of alcohol, tobacco or illicit drugs  . Current medications and supplements . Functional ability and status . Nutritional status . Physical activity . Advanced directives . List of other physicians . Hospitalizations, surgeries, and ER visits in previous 12 months . Vitals . Screenings to include cognitive, depression, and falls . Referrals and appointments  In addition, I have reviewed and discussed with patient certain preventive protocols, quality metrics, and best practice recommendations. A written personalized care plan for preventive services as well as general preventive health recommendations were provided to patient.     Willette Brace, LPN   [id number]   Nurse Notes: None

## 2019-12-06 ENCOUNTER — Other Ambulatory Visit: Payer: Self-pay | Admitting: Podiatry

## 2019-12-06 DIAGNOSIS — B353 Tinea pedis: Secondary | ICD-10-CM

## 2019-12-10 ENCOUNTER — Other Ambulatory Visit: Payer: Self-pay | Admitting: Podiatry

## 2019-12-10 DIAGNOSIS — B353 Tinea pedis: Secondary | ICD-10-CM

## 2019-12-10 NOTE — Telephone Encounter (Signed)
Please advise 

## 2019-12-16 ENCOUNTER — Encounter: Payer: Self-pay | Admitting: Podiatry

## 2019-12-16 ENCOUNTER — Other Ambulatory Visit: Payer: Self-pay

## 2019-12-16 ENCOUNTER — Ambulatory Visit (INDEPENDENT_AMBULATORY_CARE_PROVIDER_SITE_OTHER): Payer: Medicare Other | Admitting: Podiatry

## 2019-12-16 DIAGNOSIS — L84 Corns and callosities: Secondary | ICD-10-CM | POA: Diagnosis not present

## 2019-12-16 DIAGNOSIS — D689 Coagulation defect, unspecified: Secondary | ICD-10-CM

## 2019-12-16 DIAGNOSIS — D6851 Activated protein C resistance: Secondary | ICD-10-CM | POA: Diagnosis not present

## 2019-12-16 DIAGNOSIS — B351 Tinea unguium: Secondary | ICD-10-CM

## 2019-12-16 DIAGNOSIS — M79676 Pain in unspecified toe(s): Secondary | ICD-10-CM | POA: Diagnosis not present

## 2019-12-17 DIAGNOSIS — Z23 Encounter for immunization: Secondary | ICD-10-CM | POA: Diagnosis not present

## 2019-12-21 NOTE — Progress Notes (Signed)
Subjective: Robin Harrell presents today for painful, thick elongated toenails.and chronic preulcerative hypoerkeratotic lesions. She has had flexor tenotomy performed by Dr. Sherryle Lis and right 3rd digit has resolved.  She is wearing extra depth shoes with plastizote insoles.  [name], MD is patient's PCP. Last visit was: 11/18/2019.  Past Medical History:  Diagnosis Date  . Allergic rhinitis   . Cough   . Difficulty in swallowing    w/ occasional aspiration  . DVT (deep venous thrombosis) ([place])   . Dyslipidemia   . Factor V Leiden deficiency    lifelong coumadin  . GERD (gastroesophageal reflux disease)   . History of nuclear stress test 12/28/2007   lexiscan; low risk   . HTN (hypertension)   . PND (post-nasal drip)   . Pulmonary embolism ([place])   . PVC's (premature ventricular contractions)   . Thyroid disease      Patient Active Problem List   Diagnosis Date Noted  . Primary osteoarthritis of both knees 11/18/2019  . Skin ulcer of toe of right foot, limited to breakdown of skin ([place]) 05/09/2019  . Acute respiratory disease due to COVID-19 virus 02/26/2019  . Pneumonia due to COVID-19 virus 02/26/2019  . Acute respiratory failure due to COVID-19 ([place]) 02/25/2019  . 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 Chronic venous stasis dermatitis of both lower extremities 12/31/2018  . Aortic stenosis, mild 02/07/2017  . History of recurrent UTIs 01/26/2017  . Chronic idiopathic thrombocytopenia ([place]) 04/14/2016  . Essential tremor 09/03/2015  . History of respiratory failure 11/11/2014  . Hypothyroidism (acquired) 09/02/2014  . Edema of right lower extremity 02/01/2013  . Hyperlipidemia 11/19/2012  . Colon polyp 10/15/2012  . Long term (current) use of anticoagulants 05/10/2012  . Insomnia 10/11/2011  . Osteoarthritis, multiple sites 06/30/2011  . Diastolic dysfunction 12/15/8525  . Factor V deficiency ([place]) 02/09/2010  . Osteoporosis 02/09/2010  . DJD  (degenerative joint disease), lumbar 10/14/2009  . Glaucoma 01/29/2008  . Essential hypertension 11/15/2007  . History of DVT (deep vein thrombosis) 11/15/2007  . Seasonal and perennial allergic rhinitis 11/15/2007  . GERD 11/15/2007  . COUGH, CHRONIC 11/15/2007    Current Outpatient Medications on File Prior to Visit  Medication Sig Dispense Refill  . acetaminophen (TYLENOL) 500 MG tablet Take 1,000 mg by mouth every 6 (six) hours as needed for moderate pain.    Marland Kitchen atorvastatin (LIPITOR) 10 MG tablet TAKE 1 TABLET (10 MG TOTAL) BY MOUTH DAILY AT 6 PM. 90 tablet 2  . brimonidine (ALPHAGAN) 0.15 % ophthalmic solution Place 2 drops into both eyes 2 (two) times daily.   3  . Calcium-Phosphorus-Vitamin D (CALCIUM/D3 ADULT GUMMIES) 200-96.6-200 MG-MG-UNIT CHEW Chew 2 tablets by mouth daily.    . chlorpheniramine (CHLOR-TRIMETON) 4 MG tablet Take 4 mg by mouth every morning.     . clotrimazole-betamethasone (LOTRISONE) cream APPLY TO BOTH FEET AND BETWEEN TOES TWICE A DAY FOR 4 WEEKS. 45 g 0  . fluticasone (FLONASE) 50 MCG/ACT nasal spray SPRAY 2 SPRAYS INTO EACH NOSTRIL EVERY DAY 48 mL 1  . ipratropium (ATROVENT) 0.03 % nasal spray Place 2 sprays into both nostrils 3 (three) times daily as needed for rhinitis. 30 mL 6  . latanoprost (XALATAN) 0.005 % ophthalmic solution Place 1 drop into both eyes at bedtime.     Marland Kitchen levothyroxine (SYNTHROID) 25 MCG tablet Take 1 tablet (25 mcg total) by mouth daily before breakfast. 90 tablet 3  . loratadine (CLARITIN) 10 MG tablet Take 1 tablet (  10 mg total) by mouth daily. 30 tablet 11  . losartan (COZAAR) 50 MG tablet TAKE 1 TABLET BY MOUTH EVERY DAY 90 tablet 1  . metoprolol tartrate (LOPRESSOR) 25 MG tablet TAKE 1 TABLET (25 MG TOTAL) BY MOUTH 2 (TWO) TIMES DAILY. *NEEDS OFFICE VISIT FOR FURTHER REFILLS* 180 tablet 2  . multivitamin-iron-minerals-folic acid (CENTRUM) chewable tablet Chew 1 tablet by mouth daily.    . vitamin E (VITAMIN E) 1000 UNIT capsule  Take 1,000 Units by mouth daily.    Marland Kitchen warfarin (COUMADIN) 7.5 MG tablet TAKE 1/2 TO 1 TABLET BY MOUTH DAILY AS DIRECTED 90 tablet 1   No current facility-administered medications on file prior to visit.     Allergies  Allergen Reactions  . Azithromycin Nausea Only  . Codeine Nausea Only  . Erythromycin Nausea And Vomiting    Objective: Robin Harrell is a pleasant 86 y.o. Caucasian female WD, WN in NAD. AAO x 3.  There were no vitals filed for this visit.  Vascular Examination: Capillary fill time to digits <3 seconds b/l lower extremities. Palpable pedal pulses b/l LE. Pedal hair absent. Lower extremity skin temperature gradient within normal limits. No pain with calf compression b/l. Varicosities present b/l. No ischemia or gangrene noted b/l lower extremities.  Dermatological Examination: Pedal skin with normal turgor, texture and tone bilaterally. No open wounds bilaterally. No interdigital macerations bilaterally. Toenails 1-5 b/l well maintained with adequate length. No erythema, no edema, no drainage, no flocculence. Right 3rd digit with resolved minimal hyperkeratosis. No pain on palpation. Incision healed on plantar aspect of digit.  Musculoskeletal: Normal muscle strength 5/5 to all lower extremity muscle groups bilaterally. No pain crepitus or joint limitation noted with ROM b/l. Hammertoes noted to the 2-5 bilaterally.  Neurological Examination: Protective sensation intact 5/5 intact bilaterally with 10g monofilament b/l. Vibratory sensation intact b/l. Proprioception intact bilaterally. Clonus negative b/l.    Assessment: 1. Pain due to onychomycosis of toenail   2. Corns   3. Factor V Leiden ([place])   4. Coagulation defect ([place])     Plan: -Examined patient. -Toenails 1-5 b/l debrided in length and girth. -Debridement of hyperkeratosis right 3rd digit. -Patient to continue extra depth shoes daily. -Patient/POA to call should there be question/concern in the  interim.  Return in about 3 months (around 03/17/2020) for toenail debridement w/corn(s)/callus(es).  Marzetta Board, DPM

## 2020-01-06 ENCOUNTER — Ambulatory Visit (INDEPENDENT_AMBULATORY_CARE_PROVIDER_SITE_OTHER): Payer: Medicare Other

## 2020-01-06 ENCOUNTER — Telehealth: Payer: Self-pay

## 2020-01-06 ENCOUNTER — Other Ambulatory Visit: Payer: Self-pay

## 2020-01-06 DIAGNOSIS — Z7901 Long term (current) use of anticoagulants: Secondary | ICD-10-CM | POA: Diagnosis not present

## 2020-01-06 LAB — POCT INR: INR: 2.6 (ref 2.0–3.0)

## 2020-01-06 NOTE — Telephone Encounter (Signed)
Pt called asking if Dr. Jonni Sanger would send in a prescription of amoxicillin. Pt is having a dental procedure done on Wednesday and is needing the antibiotic. Pt states her dentist no longer prescribes it. Pt is asking for a call back once completed. Please advise.

## 2020-01-06 NOTE — Patient Instructions (Signed)
Continue taking 1 tablet daily except for 1/2 tablet each Monday, Wednesday and Friday.  Repeat INR in 6 weeks.   

## 2020-01-07 MED ORDER — AMOXICILLIN 500 MG PO CAPS: 2000.0000 mg | ORAL_CAPSULE | Freq: Once | ORAL | 3 refills | Status: AC

## 2020-01-07 NOTE — Telephone Encounter (Signed)
Please advise 

## 2020-01-07 NOTE — Telephone Encounter (Signed)
Please notify pt: amox has been ordered.

## 2020-01-07 NOTE — Telephone Encounter (Signed)
Patient notified that antibiotic was sent in.

## 2020-01-09 DIAGNOSIS — Z961 Presence of intraocular lens: Secondary | ICD-10-CM | POA: Diagnosis not present

## 2020-01-09 DIAGNOSIS — H401132 Primary open-angle glaucoma, bilateral, moderate stage: Secondary | ICD-10-CM | POA: Diagnosis not present

## 2020-02-17 ENCOUNTER — Other Ambulatory Visit: Payer: Self-pay

## 2020-02-17 ENCOUNTER — Ambulatory Visit (INDEPENDENT_AMBULATORY_CARE_PROVIDER_SITE_OTHER): Payer: Medicare Other

## 2020-02-17 DIAGNOSIS — Z7901 Long term (current) use of anticoagulants: Secondary | ICD-10-CM

## 2020-02-17 LAB — POCT INR: INR: 3.2 — AB (ref 2.0–3.0)

## 2020-02-17 NOTE — Patient Instructions (Signed)
Continue taking 1 tablet daily except for 1/2 tablet each Monday, Wednesday and Friday.  Repeat INR in 6 weeks. Eat greens over the next 2 days Monday and Tuesday

## 2020-03-11 ENCOUNTER — Other Ambulatory Visit: Payer: Self-pay | Admitting: Cardiovascular Disease

## 2020-03-23 ENCOUNTER — Encounter: Payer: Self-pay | Admitting: Podiatry

## 2020-03-23 ENCOUNTER — Other Ambulatory Visit: Payer: Self-pay

## 2020-03-23 ENCOUNTER — Ambulatory Visit (INDEPENDENT_AMBULATORY_CARE_PROVIDER_SITE_OTHER): Payer: Medicare Other | Admitting: Podiatry

## 2020-03-23 DIAGNOSIS — M79676 Pain in unspecified toe(s): Secondary | ICD-10-CM | POA: Diagnosis not present

## 2020-03-23 DIAGNOSIS — D6851 Activated protein C resistance: Secondary | ICD-10-CM

## 2020-03-23 DIAGNOSIS — M2041 Other hammer toe(s) (acquired), right foot: Secondary | ICD-10-CM

## 2020-03-23 DIAGNOSIS — M2042 Other hammer toe(s) (acquired), left foot: Secondary | ICD-10-CM

## 2020-03-23 DIAGNOSIS — M2011 Hallux valgus (acquired), right foot: Secondary | ICD-10-CM

## 2020-03-23 DIAGNOSIS — L84 Corns and callosities: Secondary | ICD-10-CM

## 2020-03-23 DIAGNOSIS — B351 Tinea unguium: Secondary | ICD-10-CM | POA: Diagnosis not present

## 2020-03-23 DIAGNOSIS — M2012 Hallux valgus (acquired), left foot: Secondary | ICD-10-CM
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
## 2020-03-30 ENCOUNTER — Ambulatory Visit (INDEPENDENT_AMBULATORY_CARE_PROVIDER_SITE_OTHER): Payer: Medicare Other

## 2020-03-30 ENCOUNTER — Other Ambulatory Visit: Payer: Self-pay

## 2020-03-30 DIAGNOSIS — Z7901 Long term (current) use of anticoagulants: Secondary | ICD-10-CM

## 2020-03-30 LAB — POCT INR: INR: 3.1 — AB (ref 2.0–3.0)

## 2020-03-30 NOTE — Patient Instructions (Signed)
Continue taking 1 tablet daily except for 1/2 tablet each Monday, Wednesday and Friday.  Repeat INR in 6 weeks. Eat greens over the next 3 days Monday and Tuesday

## 2020-04-17 ENCOUNTER — Telehealth: Payer: Self-pay

## 2020-04-17 NOTE — Telephone Encounter (Signed)
Robin Harrell called in requesting for a prolia shot, okay to does she need approval from insurance

## 2020-04-20 ENCOUNTER — Other Ambulatory Visit: Payer: Self-pay | Admitting: Family Medicine

## 2020-04-20 DIAGNOSIS — I10 Essential (primary) hypertension: Secondary | ICD-10-CM

## 2020-04-22 NOTE — Telephone Encounter (Signed)
Pt called in following up on this. She is requesting a call.

## 2020-04-23 NOTE — Telephone Encounter (Signed)
Patient has been scheduled

## 2020-04-23 NOTE — Telephone Encounter (Signed)
Please schedule patient for Prolia next week

## 2020-04-23 NOTE — Telephone Encounter (Signed)
LVM to schedule pt.

## 2020-04-28 ENCOUNTER — Ambulatory Visit (INDEPENDENT_AMBULATORY_CARE_PROVIDER_SITE_OTHER): Payer: Medicare Other

## 2020-04-28 ENCOUNTER — Other Ambulatory Visit: Payer: Self-pay

## 2020-04-28 DIAGNOSIS — M810 Age-related osteoporosis without current pathological fracture: Secondary | ICD-10-CM | POA: Diagnosis not present

## 2020-04-28 MED ORDER — DENOSUMAB 60 MG/ML ~~LOC~~ SOSY
60.0000 mg | PREFILLED_SYRINGE | Freq: Once | SUBCUTANEOUS | Status: AC
  Administered 2020-04-28: 60 mg via SUBCUTANEOUS

## 2020-04-28 NOTE — Progress Notes (Signed)
Per orders of Dr. Jonni Sanger , injection of Prolia given by Tobe Sos in left arm. Patient tolerated injection well. Patient will make appointment for 6 month.

## 2020-05-04 ENCOUNTER — Ambulatory Visit (INDEPENDENT_AMBULATORY_CARE_PROVIDER_SITE_OTHER): Payer: Medicare Other

## 2020-05-04 ENCOUNTER — Other Ambulatory Visit: Payer: Self-pay

## 2020-05-04 DIAGNOSIS — Z7901 Long term (current) use of anticoagulants: Secondary | ICD-10-CM | POA: Diagnosis not present

## 2020-05-04 LAB — POCT INR: INR: 2.7 (ref 2.0–3.0)

## 2020-05-04 NOTE — Patient Instructions (Signed)
Continue taking 1 tablet daily except for 1/2 tablet each Monday, Wednesday and Friday.  Repeat INR in 6 weeks.

## 2020-05-05 ENCOUNTER — Ambulatory Visit: Payer: Medicare Other

## 2020-05-06 ENCOUNTER — Other Ambulatory Visit: Payer: Self-pay | Admitting: Pulmonary Disease

## 2020-05-07 ENCOUNTER — Other Ambulatory Visit: Payer: Self-pay

## 2020-05-07 ENCOUNTER — Ambulatory Visit (HOSPITAL_COMMUNITY): Payer: Medicare Other | Attending: Cardiology

## 2020-05-07 DIAGNOSIS — I350 Nonrheumatic aortic (valve) stenosis: Secondary | ICD-10-CM | POA: Diagnosis not present

## 2020-05-07 DIAGNOSIS — I5189 Other ill-defined heart diseases: Secondary | ICD-10-CM | POA: Insufficient documentation

## 2020-05-07 LAB — ECHOCARDIOGRAM COMPLETE
AR max vel: 0.95 cm2
AV Area VTI: 0.93 cm2
AV Area mean vel: 0.9 cm2
AV Mean grad: 19 mmHg
AV Peak grad: 34.5 mmHg
Ao pk vel: 2.94 m/s
Area-P 1/2: 2.36 cm2
MV M vel: 5.28 m/s
MV Peak grad: 111.5 mmHg
MV VTI: 0.92 cm2
P 1/2 time: 471 msec
S' Lateral: 2.25 cm

## 2020-05-18 ENCOUNTER — Other Ambulatory Visit: Payer: Self-pay | Admitting: Cardiovascular Disease

## 2020-05-21 ENCOUNTER — Telehealth: Payer: Self-pay | Admitting: Cardiovascular Disease

## 2020-05-21 DIAGNOSIS — I10 Essential (primary) hypertension: Secondary | ICD-10-CM

## 2020-05-21 MED ORDER — ATORVASTATIN CALCIUM 10 MG PO TABS: 10.0000 mg | ORAL_TABLET | Freq: Every day | ORAL | 1 refills | Status: DC

## 2020-05-21 MED ORDER — METOPROLOL TARTRATE 25 MG PO TABS: 25.0000 mg | ORAL_TABLET | Freq: Two times a day (BID) | ORAL | 3 refills | Status: DC

## 2020-05-21 MED ORDER — LOSARTAN POTASSIUM 50 MG PO TABS: 50.0000 mg | ORAL_TABLET | Freq: Every day | ORAL | 3 refills | Status: DC

## 2020-05-21 NOTE — Telephone Encounter (Signed)
    Pt c/o medication issue:  1. Name of Medication:   metoprolol tartrate (LOPRESSOR) 25 MG tablet   2. How are you currently taking this medication (dosage and times per day)? TAKE 1 TABLET (25 MG TOTAL) BY MOUTH 2 (TWO) TIMES DAILY.   3. Are you having a reaction (difficulty breathing--STAT)?   4. What is your medication issue? Pt would like to ask Dr. Gwenlyn Found if he took her off this medication

## 2020-05-21 NOTE — Telephone Encounter (Signed)
Spoke with pt on the regarding change in metoprolol. Pt states that she got a message from her pharmacy stating that metoprolol has been taken off her medication list. Pt is wondering if this was done by Dr. Gwenlyn Found. Per chart no medication changes per Dr. Gwenlyn Found, however it does look like her prescription has expired. Per Dr. Kennon Holter last note pt can be seen as needed. Refills for pt's cardiac medication sent to pharmacy. Pt verbalizes understanding and is thankful for the call.

## 2020-05-22 ENCOUNTER — Encounter: Payer: Self-pay | Admitting: Family Medicine

## 2020-05-22 ENCOUNTER — Ambulatory Visit (INDEPENDENT_AMBULATORY_CARE_PROVIDER_SITE_OTHER): Payer: Medicare Other | Admitting: Family Medicine

## 2020-05-22 ENCOUNTER — Other Ambulatory Visit: Payer: Self-pay

## 2020-05-22 VITALS — BP 134/82 | HR 87 | Temp 97.7°F | Wt 121.8 lb

## 2020-05-22 DIAGNOSIS — D693 Immune thrombocytopenic purpura: Secondary | ICD-10-CM

## 2020-05-22 DIAGNOSIS — J3089 Other allergic rhinitis: Secondary | ICD-10-CM | POA: Diagnosis not present

## 2020-05-22 DIAGNOSIS — E039 Hypothyroidism, unspecified: Secondary | ICD-10-CM | POA: Diagnosis not present

## 2020-05-22 DIAGNOSIS — I10 Essential (primary) hypertension: Secondary | ICD-10-CM | POA: Diagnosis not present

## 2020-05-22 DIAGNOSIS — D682 Hereditary deficiency of other clotting factors: Secondary | ICD-10-CM

## 2020-05-22 DIAGNOSIS — M810 Age-related osteoporosis without current pathological fracture: Secondary | ICD-10-CM

## 2020-05-22 DIAGNOSIS — J302 Other seasonal allergic rhinitis: Secondary | ICD-10-CM | POA: Diagnosis not present

## 2020-05-22 DIAGNOSIS — D508 Other iron deficiency anemias: Secondary | ICD-10-CM | POA: Diagnosis not present

## 2020-05-22 DIAGNOSIS — Z7901 Long term (current) use of anticoagulants: Secondary | ICD-10-CM

## 2020-05-22 LAB — BASIC METABOLIC PANEL
BUN: 20 mg/dL (ref 6–23)
CO2: 27 mEq/L (ref 19–32)
Calcium: 9 mg/dL (ref 8.4–10.5)
Chloride: 104 mEq/L (ref 96–112)
Creatinine, Ser: 1.01 mg/dL (ref 0.40–1.20)
GFR: 51.18 mL/min — ABNORMAL LOW (ref >60.00)
Glucose, Bld: 91 mg/dL (ref 70–99)
Potassium: 4 mEq/L (ref 3.5–5.1)
Sodium: 139 mEq/L (ref 135–145)

## 2020-05-22 LAB — TSH: TSH: 3.26 u[IU]/mL (ref 0.35–4.50)

## 2020-05-22 LAB — CBC WITH DIFFERENTIAL/PLATELET
Basophils Absolute: 0 10*3/uL (ref 0.0–0.1)
Basophils Relative: 1.3 % (ref 0.0–3.0)
Eosinophils Absolute: 0.1 10*3/uL (ref 0.0–0.7)
Eosinophils Relative: 3 % (ref 0.0–5.0)
HCT: 36.2 % (ref 36.0–46.0)
Hemoglobin: 12 g/dL (ref 12.0–15.0)
Lymphocytes Relative: 20.3 % (ref 12.0–46.0)
Lymphs Abs: 0.8 10*3/uL (ref 0.7–4.0)
MCHC: 33.2 g/dL (ref 30.0–36.0)
MCV: 94.6 fl (ref 78.0–100.0)
Monocytes Absolute: 0.4 10*3/uL (ref 0.1–1.0)
Monocytes Relative: 11.1 % (ref 3.0–12.0)
Neutro Abs: 2.4 10*3/uL (ref 1.4–7.7)
Neutrophils Relative %: 64.3 % (ref 43.0–77.0)
Platelets: 127 10*3/uL — ABNORMAL LOW (ref 150.0–400.0)
RBC: 3.83 Mil/uL — ABNORMAL LOW (ref 3.87–5.11)
RDW: 13.8 % (ref 11.5–15.5)
WBC: 3.8 10*3/uL — ABNORMAL LOW (ref 4.0–10.5)

## 2020-05-22 LAB — VITAMIN D 25 HYDROXY (VIT D DEFICIENCY, FRACTURES): VITD: 51.78 ng/mL (ref 30.00–100.00)

## 2020-05-22 MED ORDER — WARFARIN SODIUM 7.5 MG PO TABS: ORAL_TABLET | ORAL | 1 refills | Status: DC

## 2020-05-22 NOTE — Progress Notes (Signed)
Subjective  CC:  Chief Complaint  Patient presents with  . Hyperlipidemia  . Hypertension  . Hypothyroidism  . Osteoarthritis  . echocardiogram results    Ordered by Dr. Gwenlyn Found - wanting to discuss with Dr. Jonni Sanger, does not understand the results     HPI: Robin Harrell is a 87 y.o. female who presents to the office today to address the problems listed above in the chief complaint.  Hypertension f/u: Control is good . Pt reports she is doing well. taking medications as instructed, no medication side effects noted, no TIAs, no chest pain on exertion, no dyspnea on exertion. Has diastolic dysfunction and mild leg edema. On metroprolol 25 bid. Has AS and MR; reviewed recent echocardiogram results with her. No lightheadedness or near syncope. She denies adverse effects from his BP medications. Compliance with medication is good.   Mild iron and b12 anemia: on supplements. Feels fine  Low thyroid on low dose supplement. 8mcg daily. Reports eating well w/o sxs of low or high thyroid but feels she has lost weight. Weight is stable by our scales.   OA: knees are stable. voltaren gel.   Allergies are active. No respiratory sxs  Osteoporosis: on prolia x > 2 years. Due for recheck dexa.   Chronic AC: no bleeding.  Assessment  1. Essential hypertension   2. Seasonal and perennial allergic rhinitis   3. Long term (current) use of anticoagulants   4. Chronic idiopathic thrombocytopenia (HCC)   5. Factor V deficiency ([place])   6. Hypothyroidism (acquired)   7. Iron deficiency anemia secondary to inadequate dietary iron intake   8. Age-related osteoporosis without current pathological fracture      Plan    Hypertension f/u: BP control is well controlled. Continue bb 25 bid  AR: continue allergy meds. No infection  Factor V Leiden deficiency: on coumadin w/o AEs  Low thyroid f/u: recheck to ensure stability.  Recheck hgb and iron and b12 levels.   AS: education given.    Recheck dexa on prolia. Check vit D and calcium levels.   Education regarding management of these chronic disease states was given. Management strategies discussed on successive visits include dietary and exercise recommendations, goals of achieving and maintaining IBW, and lifestyle modifications aiming for adequate sleep and minimizing stressors.   Follow up: 6 mo for cpe and recheck.   Orders Placed This Encounter  Procedures  . DG Bone Density  . Basic metabolic panel  . CBC with Differential/Platelet  . Iron, TIBC and Ferritin Panel  . TSH  . VITAMIN D 25 Hydroxy (Vit-D Deficiency, Fractures)  . Calcium, ionized   Meds ordered this encounter  Medications  . warfarin (COUMADIN) 7.5 MG tablet    Sig: Full tablet Sunday, Tuesday, Thursday, and Saturday 1/2 tablet Monday, Wednesday, and Friday    Dispense:  90 tablet    Refill:  1    T      BP Readings from Last 3 Encounters:  05/22/20 134/82  11/18/19 116/78  10/22/19 130/72   Wt Readings from Last 3 Encounters:  05/22/20 121 lb 12.8 oz (55.2 kg)  11/18/19 126 lb 6.4 oz (57.3 kg)  10/22/19 128 lb 12.8 oz (58.4 kg)    Lab Results  Component Value Date   CHOL 158 11/18/2019   CHOL 168 01/29/2019   CHOL 159 01/30/2018   Lab Results  Component Value Date   HDL 79 11/18/2019   HDL 80.40 01/29/2019   HDL 69.90 01/30/2018  Lab Results  Component Value Date   LDLCALC 65 11/18/2019   LDLCALC 77 01/29/2019   LDLCALC 76 01/30/2018   Lab Results  Component Value Date   TRIG 60 11/18/2019   TRIG 60 02/25/2019   TRIG 53.0 01/29/2019   Lab Results  Component Value Date   CHOLHDL 2.0 11/18/2019   CHOLHDL 2 01/29/2019   CHOLHDL 2 01/30/2018   No results found for: LDLDIRECT Lab Results  Component Value Date   CREATININE 1.05 (H) 11/18/2019   BUN 25 11/18/2019   NA 137 11/18/2019   K 4.4 11/18/2019   CL 103 11/18/2019   CO2 26 11/18/2019    The ASCVD Risk score (Goff DC Jr., et al., 2013) failed to  calculate for the following reasons:   The 2013 ASCVD risk score is only valid for ages 61 to 10  I reviewed the patients updated PMH, FH, and SocHx.    Patient Active Problem List   Diagnosis Date Noted  . Hypothyroidism (acquired) 09/02/2014    Priority: High  . Hyperlipidemia 11/19/2012    Priority: High  . Long term (current) use of anticoagulants 05/10/2012    Priority: High  . Diastolic dysfunction 47/82/9562    Priority: High  . Factor V deficiency ([place]) 02/09/2010    Priority: High  . Osteoporosis 02/09/2010    Priority: High  . Essential hypertension 11/15/2007    Priority: High  . Chronic venous stasis dermatitis of both lower extremities 12/31/2018    Priority: Medium  . Aortic stenosis, mild 02/07/2017    Priority: Medium  . Chronic idiopathic thrombocytopenia (HCC) 04/14/2016    Priority: Medium  . Essential tremor 09/03/2015    Priority: Medium  . Edema of right lower extremity 02/01/2013    Priority: Medium  . Colon polyp 10/15/2012    Priority: Medium  . Insomnia 10/11/2011    Priority: Medium  . Osteoarthritis, multiple sites 06/30/2011    Priority: Medium  . DJD (degenerative joint disease), lumbar 10/14/2009    Priority: Medium  . Glaucoma 01/29/2008    Priority: Medium  . History of DVT (deep vein thrombosis) 11/15/2007    Priority: Medium  . GERD 11/15/2007    Priority: Medium  . COUGH, CHRONIC 11/15/2007    Priority: Medium  . Primary osteoarthritis of both knees 11/18/2019    Priority: Low  . 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 History of recurrent UTIs 01/26/2017    Priority: Low  . History of respiratory failure 11/11/2014    Priority: Low  . Seasonal and perennial allergic rhinitis 11/15/2007    Priority: Low    Allergies: Azithromycin, Codeine, and Erythromycin  Social History: Patient  reports that she quit smoking about 62 years ago. Her smoking use included cigarettes. She quit after 1.00 year of use.  She has never used smokeless tobacco. She reports current alcohol use. She reports that she does not use drugs.  Current Meds  Medication Sig  . acetaminophen (TYLENOL) 500 MG tablet Take 1,000 mg by mouth every 6 (six) hours as needed for moderate pain.  Marland Kitchen atorvastatin (LIPITOR) 10 MG tablet Take 1 tablet (10 mg total) by mouth daily at 6 PM.  . brimonidine (ALPHAGAN) 0.15 % ophthalmic solution Place 2 drops into both eyes 2 (two) times daily.   . brimonidine (ALPHAGAN) 0.2 % ophthalmic solution SMARTSIG:In Eye(s)  . Calcium-Phosphorus-Vitamin D (CALCIUM/D3 ADULT GUMMIES) 200-96.6-200 MG-MG-UNIT CHEW Chew 2 tablets by mouth daily.  . chlorpheniramine (  CHLOR-TRIMETON) 4 MG tablet Take 4 mg by mouth every morning.   . fluticasone (FLONASE) 50 MCG/ACT nasal spray SPRAY 2 SPRAYS INTO EACH NOSTRIL EVERY DAY  . ipratropium (ATROVENT) 0.03 % nasal spray Place 2 sprays into both nostrils 3 (three) times daily as needed for rhinitis.  Marland Kitchen latanoprost (XALATAN) 0.005 % ophthalmic solution Place 1 drop into both eyes at bedtime.   Marland Kitchen levothyroxine (SYNTHROID) 25 MCG tablet Take 1 tablet (25 mcg total) by mouth daily before breakfast.  . loratadine (CLARITIN) 10 MG tablet Take 1 tablet (10 mg total) by mouth daily.  Marland Kitchen losartan (COZAAR) 50 MG tablet Take 1 tablet (50 mg total) by mouth daily.  . metoprolol tartrate (LOPRESSOR) 25 MG tablet Take 1 tablet (25 mg total) by mouth 2 (two) times daily.  . multivitamin-iron-minerals-folic acid (CENTRUM) chewable tablet Chew 1 tablet by mouth daily.  . vitamin E 1000 UNIT capsule Take 1,000 Units by mouth daily.  . [DISCONTINUED] warfarin (COUMADIN) 7.5 MG tablet TAKE 1/2 TO 1 TABLET BY MOUTH DAILY AS DIRECTED (Patient taking differently: Full tablet Sunday, Tuesday, Thursday, and Saturday 1/2 tablet Monday, Wednesday, and Friday)    Review of Systems: Cardiovascular: negative for chest pain, palpitations, leg swelling, orthopnea Respiratory: negative for SOB,  wheezing or persistent cough Gastrointestinal: negative for abdominal pain Genitourinary: negative for dysuria or gross hematuria  Objective  Vitals: BP 134/82   Pulse 87   Temp 97.7 F (36.5 C) (Temporal)   Wt 121 lb 12.8 oz (55.2 kg)   LMP  (LMP Unknown)   SpO2 97%   BMI 20.91 kg/m  General: no acute distress , kyphotic Psych:  Alert and oriented, normal mood and affect HEENT:  Normocephalic, atraumatic, supple neck  Cardiovascular:  RRR 2/6 systolic murmur Respiratory:  Good breath sounds bilaterally, CTAB with normal respiratory effort Ext: +1 pitting edema  Commons side effects, risks, benefits, and alternatives for medications and treatment plan prescribed today were discussed, and the patient expressed understanding of the given instructions. Patient is instructed to call or message via MyChart if he/she has any questions or concerns regarding our treatment plan. No barriers to understanding were identified. We discussed Red Flag symptoms and signs in detail. Patient expressed understanding regarding what to do in case of urgent or emergency type symptoms.   Medication list was reconciled, printed and provided to the patient in AVS. Patient instructions and summary information was reviewed with the patient as documented in the AVS. This note was prepared with assistance of Dragon voice recognition software. Occasional wrong-word or sound-a-like substitutions may have occurred due to the inherent limitations of voice recognition software  This visit occurred during the SARS-CoV-2 public health emergency.  Safety protocols were in place, including screening questions prior to the visit, additional usage of staff PPE, and extensive cleaning of exam room while observing appropriate contact time as indicated for disinfecting solutions.

## 2020-05-22 NOTE — Patient Instructions (Signed)
Please return in in September for your annual complete physical; please come fasting.  I will release your lab results to you on your MyChart account with further instructions. Please reply with any questions.   We will call you to schedule your DEXA bone density test.   If you have any questions or concerns, please don't hesitate to send me a message via MyChart or call the office at [phone]. Thank you for visiting with [place] today! It's our pleasure caring for you.

## 2020-05-24 LAB — IRON,TIBC AND FERRITIN PANEL
%SAT: 23 % (calc) (ref 16–45)
Ferritin: 155 ng/mL (ref 16–288)
Iron: 70 ug/dL (ref 45–160)
TIBC: 304 mcg/dL (calc) (ref 250–450)

## 2020-05-24 LAB — CALCIUM, IONIZED: Calcium, Ion: 4.69 mg/dL — ABNORMAL LOW (ref 4.8–5.6)

## 2020-06-01 ENCOUNTER — Telehealth: Payer: Self-pay | Admitting: Emergency Medicine

## 2020-06-01 NOTE — Telephone Encounter (Signed)
Primary Pulmonologist: Robin Harrell Last office visit and with whom:  August 2021 with Robin Harrell What do we see them for (pulmonary problems): cough and seasonal rhinitis Last OV assessment/plan: Please continue nasal saline rinses twice a day Continue to use your ipratropium nasal spray and fluticasone nasal spray as you have been doing them. Continue your chlorpheniramine tabs as you have been taking it. Try stopping your cetirizine.  Start loratadine 10 mg once daily.  This is affordable at the [facility] and [facility] outpatient pharmacies. Continue to practice your swallowing precautions to avoid aspiration with meals Follow with Dr Lamonte Sakai in 6 months or sooner if you have any problems  Was appointment offered to patient (explain)?  Patient wanted recommendations first.    Reason for call: Called and spoke with patient. She stated that she has been struggling with her allergies for the past few weeks but her symptoms increased drastically yesterday. She has a runny nose with clear discharge. She also has a productive cough with clear phlegm. Denied any wheezing, body aches or fever. She stated that she does not feel like herself, just weak. She also denied being around anyone who has been sick recently.   She has taking chlorpheniramine 4mg  daily, Flonase nasal spray and ipratropium nasal spray without any relief.   (examples of things to ask: : When did symptoms start? Fever? Cough? Productive? Color to sputum? More sputum than usual? Wheezing? Have you needed increased oxygen? Are you taking your respiratory medications? What over the counter measures have you tried?)  Allergies  Allergen Reactions  . Azithromycin Nausea Only  . Codeine Nausea Only  . Erythromycin Nausea And Vomiting    Immunization History  Administered Date(s) Administered  . Fluad Quad(high Dose 65+) 10/17/2018, 11/18/2019  . Influenza Split 11/21/2008, 02/09/2010, 10/24/2011, 11/21/2012  . Influenza Whole 11/27/2008  .  Influenza, High Dose Seasonal PF 11/12/2011, 11/29/2012, 11/05/2013, 01/05/2015, 11/16/2015, 10/21/2016, 11/09/2017  . Influenza,inj,Quad PF,6+ Mos 11/23/2015  . Influenza-Unspecified 02/02/2011  . PFIZER(Purple Top)SARS-COV-2 Vaccination 06/03/2019, 06/17/2019  . Pneumococcal Conjugate-13 04/23/2013  . Pneumococcal Polysaccharide-23 02/21/2005  . Tdap 09/22/2007  . Zoster 04/22/2010  . Zoster Recombinat (Shingrix) 07/11/2016, 01/27/2017   She wants to know if Robin Harrell would be willing to send in an abx for her.   Pharmacy is [facility] on [address].   Robin Harrell, can you please advise? Thanks.

## 2020-06-02 NOTE — Telephone Encounter (Signed)
Left message for patient to call back  

## 2020-06-02 NOTE — Telephone Encounter (Signed)
Pt returning missed called. Please advise.

## 2020-06-02 NOTE — Telephone Encounter (Signed)
Continue all her usual allergy meds as she is taking She can temporarily try using OTC congestion medication like Tylenol cold & flu Also, she could try starting nasal saline rinses qd  Does she need anything for cough suppression? Could try OTC delsym

## 2020-06-02 NOTE — Telephone Encounter (Signed)
ATC LVMTCB x 1  

## 2020-06-03 ENCOUNTER — Telehealth (INDEPENDENT_AMBULATORY_CARE_PROVIDER_SITE_OTHER): Payer: Medicare Other | Admitting: Physician Assistant

## 2020-06-03 DIAGNOSIS — R059 Cough, unspecified: Secondary | ICD-10-CM | POA: Diagnosis not present

## 2020-06-03 DIAGNOSIS — J3089 Other allergic rhinitis: Secondary | ICD-10-CM

## 2020-06-03 DIAGNOSIS — J302 Other seasonal allergic rhinitis: Secondary | ICD-10-CM
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
## 2020-06-04 ENCOUNTER — Ambulatory Visit: Payer: Medicare Other | Attending: Internal Medicine

## 2020-06-04 DIAGNOSIS — Z20822 Contact with and (suspected) exposure to covid-19: Secondary | ICD-10-CM | POA: Diagnosis not present

## 2020-06-05 LAB — NOVEL CORONAVIRUS, NAA: SARS-CoV-2, NAA: NOT DETECTED

## 2020-06-05 LAB — SARS-COV-2, NAA 2 DAY TAT

## 2020-06-08 ENCOUNTER — Ambulatory Visit (INDEPENDENT_AMBULATORY_CARE_PROVIDER_SITE_OTHER): Payer: Medicare Other

## 2020-06-08 ENCOUNTER — Telehealth: Payer: Medicare Other | Admitting: Family Medicine

## 2020-06-08 ENCOUNTER — Other Ambulatory Visit: Payer: Self-pay

## 2020-06-08 ENCOUNTER — Telehealth: Payer: Self-pay | Admitting: Emergency Medicine

## 2020-06-08 ENCOUNTER — Ambulatory Visit (INDEPENDENT_AMBULATORY_CARE_PROVIDER_SITE_OTHER): Payer: Medicare Other | Admitting: Acute Care

## 2020-06-08 ENCOUNTER — Ambulatory Visit (INDEPENDENT_AMBULATORY_CARE_PROVIDER_SITE_OTHER)
Admission: RE | Admit: 2020-06-08 | Discharge: 2020-06-08 | Disposition: A | Discharge status: Home / Self Care | Payer: Medicare Other | Source: Ambulatory Visit | Attending: Family Medicine | Admitting: Family Medicine

## 2020-06-08 ENCOUNTER — Other Ambulatory Visit (INDEPENDENT_AMBULATORY_CARE_PROVIDER_SITE_OTHER): Payer: Medicare Other

## 2020-06-08 ENCOUNTER — Encounter: Payer: Self-pay | Admitting: Acute Care

## 2020-06-08 VITALS — BP 118/72 | HR 83 | Temp 98.3°F | Ht 62.0 in | Wt 124.2 lb

## 2020-06-08 DIAGNOSIS — J302 Other seasonal allergic rhinitis: Secondary | ICD-10-CM | POA: Diagnosis not present

## 2020-06-08 DIAGNOSIS — J3089 Other allergic rhinitis: Secondary | ICD-10-CM

## 2020-06-08 DIAGNOSIS — R059 Cough, unspecified: Secondary | ICD-10-CM | POA: Diagnosis not present

## 2020-06-08 DIAGNOSIS — M810 Age-related osteoporosis without current pathological fracture: Secondary | ICD-10-CM | POA: Diagnosis not present

## 2020-06-08 DIAGNOSIS — J90 Pleural effusion, not elsewhere classified: Secondary | ICD-10-CM | POA: Diagnosis not present

## 2020-06-08 LAB — CBC WITH DIFFERENTIAL/PLATELET
Basophils Absolute: 0 10*3/uL (ref 0.0–0.1)
Basophils Relative: 0.6 % (ref 0.0–3.0)
Eosinophils Absolute: 0.1 10*3/uL (ref 0.0–0.7)
Eosinophils Relative: 2.5 % (ref 0.0–5.0)
HCT: 34.3 % — ABNORMAL LOW (ref 36.0–46.0)
Hemoglobin: 11.7 g/dL — ABNORMAL LOW (ref 12.0–15.0)
Lymphocytes Relative: 22.4 % (ref 12.0–46.0)
Lymphs Abs: 1.3 10*3/uL (ref 0.7–4.0)
MCHC: 34 g/dL (ref 30.0–36.0)
MCV: 94.3 fl (ref 78.0–100.0)
Monocytes Absolute: 0.6 10*3/uL (ref 0.1–1.0)
Monocytes Relative: 10.8 % (ref 3.0–12.0)
Neutro Abs: 3.6 10*3/uL (ref 1.4–7.7)
Neutrophils Relative %: 63.7 % (ref 43.0–77.0)
Platelets: 131 10*3/uL — ABNORMAL LOW (ref 150.0–400.0)
RBC: 3.64 Mil/uL — ABNORMAL LOW (ref 3.87–5.11)
RDW: 13.3 % (ref 11.5–15.5)
WBC: 5.6 10*3/uL (ref 4.0–10.5)

## 2020-06-08 NOTE — Telephone Encounter (Signed)
No, I will see ay noon. Thanks

## 2020-06-08 NOTE — Telephone Encounter (Signed)
Pt stated that she is not feeling well and that her allergies has been the worse that it has ever been. Pt stated that she has been coughing mucus and she stated that she cant get the mucus out of her nose and she said that it was yellow last week but as of lately it has been clear. Also, pt wanted to be seen did inform her of the 10 a.m. appt today w/ Nurse Volanda Napoleon pt unable to make it. Pls regard [phone] or [phone].

## 2020-06-08 NOTE — Telephone Encounter (Signed)
Primary Pulmonologist: Dr Lamonte Sakai Last office visit and with whom: 10/04/2019 with Dr Lamonte Sakai What do we see them for (pulmonary problems): Cough, Chronic Last OV assessment/plan:   Most significantly impacted by her chronic rhinitis, thick clear mucus.  She is on a good regimen but the cough persists.  Question whether she may also be having intermittent aspiration.  She is trying to practice swallowing precautions.  She denies any GERD symptoms but if the cough continues going forward then I would favor restarting empiric GERD therapy.  I will continue the same regimen, change her cetirizine to loratadine due to the expense.  Please continue nasal saline rinses twice a day Continue to use your ipratropium nasal spray and fluticasone nasal spray as you have been doing them. Continue your chlorpheniramine tabs as you have been taking it. Try stopping your cetirizine.  Start loratadine 10 mg once daily.  This is affordable at the [facility] and [facility] outpatient pharmacies. Continue to practice your swallowing precautions to avoid aspiration with meals Follow with Dr Lamonte Sakai in 6 months or sooner if you have any problems      Patient Instructions by Collene Gobble, MD at 10/22/2019 2:30 PM  Author: Collene Gobble, MD Author Type: Physician Filed: 10/22/2019 2:56 PM  Note Status: Signed Cosign: Cosign Not Required Encounter Date: 10/22/2019  Editor: Collene Gobble, MD (Physician)             Please continue nasal saline rinses twice a day Continue to use your ipratropium nasal spray and fluticasone nasal spray as you have been doing them. Continue your chlorpheniramine tabs as you have been taking it. Try stopping your cetirizine.  Start loratadine 10 mg once daily.  This is affordable at the [facility] and [facility] outpatient pharmacies. Continue to practice your swallowing precautions to avoid aspiration with meals Follow with Dr Lamonte Sakai in 6 months or sooner if you have any problems          Was appointment offered to patient (explain)?  Yes scheduled to see Eric Form NP at 12pm  Reason for call: Started having symptoms last week with constant productive cough with yellow sputum last week and per patient clear phlegm today, runny nose which sometimes bleeds when blowing due to doing it so much. Per patient had virtual visit with PCP last week with these symptoms and has been taking Deslym with no improvement. Had COVID test on 06/04/2020 which was negative. Has been taking Chlor-trimeton three times a day instead of daily as ordered. Denies any fever. Scheduled office visit for today at noon with Eric Form NP. Patient agreeable.   Sarah please advise if you would like anything else done?    Allergies  Allergen Reactions  . Azithromycin Nausea Only  . Codeine Nausea Only  . Erythromycin Nausea And Vomiting    Immunization History  Administered Date(s) Administered  . Fluad Quad(high Dose 65+) 10/17/2018, 11/18/2019  . Influenza Split 11/21/2008, 02/09/2010, 10/24/2011, 11/21/2012  . Influenza Whole 11/27/2008  . Influenza, High Dose Seasonal PF 11/12/2011, 11/29/2012, 11/05/2013, 01/05/2015, 11/16/2015, 10/21/2016, 11/09/2017  . Influenza,inj,Quad PF,6+ Mos 11/23/2015  . Influenza-Unspecified 02/02/2011  . PFIZER(Purple Top)SARS-COV-2 Vaccination 06/03/2019, 06/17/2019  . Pneumococcal Conjugate-13 04/23/2013  . Pneumococcal Polysaccharide-23 02/21/2005  . Tdap 09/22/2007  . Zoster 04/22/2010  . Zoster Recombinat (Shingrix) 07/11/2016, 01/27/2017

## 2020-06-08 NOTE — Progress Notes (Signed)
History of Present Illness Robin Harrell is a 87 y.o. female former smoker ( [place] with a 1 pack year smoking history) with acute flare of her chronic rhinitis. She is followed by Dr. Lamonte Sakai.   Synopsis --87 year old woman with a history of chronic thromboembolic disease and PE with factor V Leiden deficiency, chronic cough due to rhinitis, GERD with hiatal hernia, intermittent aspiration/choking with some associated hoarseness.  She unfortunately had COVID-19 pneumonitis in January 2021, and is on supplemental oxygen.  She was treated 04/08/2019 for possible bronchitis versus pneumonia with Augmentin.  Currently managed on nasal saline rinses, ipratropium nasal spray, Zyrtec has recently been changed to Claritin, chlor tabs. Still has breakthrough nasal gtt.  She is not on any BD's.     06/08/2020  Pt. Presents for an acute OV. She Started having symptoms last week with constant productive cough with yellow sputum last week and per patient clear phlegm today,+ runny nose which sometimes bleeds when blowing due to doing it so much. Per patient had virtual visit with PCP last week with these symptoms and has been taking Deslym with no improvement. She stopped the Delsym as she did not feel it made any difference. Had COVID test on 06/04/2020 which was negative. Has been taking Chlor-trimeton three times a day instead of daily as ordered. Denies any fever.She has bad post nasal drip.She has a dry non-productive cough.She has not been using her saline rinses BID. She states they have become labor intensive. We discussed that the pollen index is currently very high, and that she does need to do this at present. We discussed doing the nasal rinses, followed by the prescribed nasal sprays.  Secretions are currently clear, she denies fever, she does have a cough due to PND. No complaint of sinus tenderness.   Test Results: CXR 06/08/2020   CBC 06/08/2020  CBC Latest Ref Rng & Units 05/22/2020  11/18/2019 05/16/2019  WBC 4.0 - 10.5 K/uL 3.8(L) 4.8 3.6(L)  Hemoglobin 12.0 - 15.0 g/dL 12.0 11.6(L) 11.2(L)  Hematocrit 36.0 - 46.0 % 36.2 35.3 33.7(L)  Platelets 150.0 - 400.0 K/uL 127.0(L) 136(L) 120.0(L)    BMP Latest Ref Rng & Units 05/22/2020 11/18/2019 05/16/2019  Glucose 70 - 99 mg/dL 91 87 102(H)  BUN 6 - 23 mg/dL 20 25 20   Creatinine 0.40 - 1.20 mg/dL 1.01 1.05(H) 0.99  BUN/Creat Ratio 6 - 22 (calc) - 24(H) -  Sodium 135 - 145 mEq/L 139 137 137  Potassium 3.5 - 5.1 mEq/L 4.0 4.4 4.2  Chloride 96 - 112 mEq/L 104 103 103  CO2 19 - 32 mEq/L 27 26 29   Calcium 8.4 - 10.5 mg/dL 9.0 8.8 9.0    BNP    Component Value Date/Time   BNP 263.5 (H) 03/02/2019 0039    ProBNP No results found for: PROBNP  PFT No results found for: FEV1PRE, FEV1POST, FVCPRE, FVCPOST, TLC, DLCOUNC, PREFEV1FVCRT, PSTFEV1FVCRT  No results found.   Past medical hx Past Medical History:  Diagnosis Date  . Allergic rhinitis   . Cough   . Difficulty in swallowing    w/ occasional aspiration  . DVT (deep venous thrombosis) ([place])   . Dyslipidemia   . Factor V Leiden deficiency    lifelong coumadin  . GERD (gastroesophageal reflux disease)   . History of nuclear stress test 12/28/2007   lexiscan; low risk   . HTN (hypertension)   . PND (post-nasal drip)   . Pneumonia due to COVID-19 virus 02/26/2019  02/25/2019-SARS-CoV-2-positive Hospitlized in [name]/2021 - GVC -received IV steroids and remdesivir, did not appear to require mechanical ventilation  . Pulmonary embolism ([place])   . PVC's (premature ventricular contractions)   . Thyroid disease      Social History   Tobacco Use  . Smoking status: Former Smoker    Years: 1.00    Types: Cigarettes    Quit date: 02/21/1958    Years since quitting: 62.3  . Smokeless tobacco: Never Used  Vaping Use  . Vaping Use: Never used  Substance Use Topics  . Alcohol use: Yes    Comment: rareley one glass of wine  . Drug use: No    Ms.Filosa reports that  she quit smoking about 62 years ago. Her smoking use included cigarettes. She quit after 1.00 year of use. She has never used smokeless tobacco. She reports current alcohol use. She reports that she does not use drugs.  Tobacco Cessation: Remote smoker quit 1960.  Past surgical hx, Family hx, Social hx all reviewed.  Current Outpatient Medications on File Prior to Visit  Medication Sig  . acetaminophen (TYLENOL) 500 MG tablet Take 1,000 mg by mouth every 6 (six) hours as needed for moderate pain.  Marland Kitchen atorvastatin (LIPITOR) 10 MG tablet Take 1 tablet (10 mg total) by mouth daily at 6 PM.  . brimonidine (ALPHAGAN) 0.15 % ophthalmic solution Place 2 drops into both eyes 2 (two) times daily.   . brimonidine (ALPHAGAN) 0.2 % ophthalmic solution SMARTSIG:In Eye(s)  . Calcium-Phosphorus-Vitamin D (CALCIUM/D3 ADULT GUMMIES) 200-96.6-200 MG-MG-UNIT CHEW Chew 2 tablets by mouth daily.  . chlorpheniramine (CHLOR-TRIMETON) 4 MG tablet Take 4 mg by mouth every morning.   . fluticasone (FLONASE) 50 MCG/ACT nasal spray SPRAY 2 SPRAYS INTO EACH NOSTRIL EVERY DAY  . ipratropium (ATROVENT) 0.03 % nasal spray Place 2 sprays into both nostrils 3 (three) times daily as needed for rhinitis.  Marland Kitchen latanoprost (XALATAN) 0.005 % ophthalmic solution Place 1 drop into both eyes at bedtime.   Marland Kitchen levothyroxine (SYNTHROID) 25 MCG tablet Take 1 tablet (25 mcg total) by mouth daily before breakfast.  . loratadine (CLARITIN) 10 MG tablet Take 1 tablet (10 mg total) by mouth daily.  Marland Kitchen losartan (COZAAR) 50 MG tablet Take 1 tablet (50 mg total) by mouth daily.  . metoprolol tartrate (LOPRESSOR) 25 MG tablet Take 1 tablet (25 mg total) by mouth 2 (two) times daily.  . multivitamin-iron-minerals-folic acid (CENTRUM) chewable tablet Chew 1 tablet by mouth daily.  . vitamin E 1000 UNIT capsule Take 1,000 Units by mouth daily.  Marland Kitchen warfarin (COUMADIN) 7.5 MG tablet Full tablet Sunday, Tuesday, Thursday, and Saturday 1/2 tablet Monday,  Wednesday, and Friday   No current facility-administered medications on file prior to visit.     Allergies  Allergen Reactions  . Azithromycin Nausea Only  . Codeine Nausea Only  . Erythromycin Nausea And Vomiting    Review Of Systems:  Constitutional:   No  weight loss, night sweats,  Fevers, chills, fatigue, or  lassitude.  HEENT:   No headaches,  Difficulty swallowing,  Tooth/dental problems, or  Sore throat,                No sneezing, itching, ear ache,+ nasal congestion,+  post nasal drip, + cough  CV:  No chest pain,  Orthopnea, + PND , No swelling in lower extremities, anasarca, dizziness, palpitations, syncope.   GI  No heartburn, indigestion, abdominal pain, nausea, vomiting, diarrhea, change in bowel habits, loss of appetite,  bloody stools.   Resp: No shortness of breath with exertion or at rest.  + excess mucus, no productive cough,  + non-productive cough,  No coughing up of blood.  No change in color of mucus. + blood from nose due to frequent blowing,   No wheezing.  No chest wall deformity  Skin: no rash or lesions.  GU: no dysuria, change in color of urine, no urgency or frequency.  No flank pain, no hematuria   MS:  No joint pain or swelling.  + decreased range of motion due to deconditioning.  No back pain.  Psych:  No change in mood or affect. No depression or anxiety.  No memory loss.   Vital Signs BP 118/72 (BP Location: Left Arm, Cuff Size: Normal)   Pulse 83   Temp 98.3 F (36.8 C) (Oral)   Ht 5\' 2"  (1.575 m)   Wt 124 lb 3.2 oz (56.3 kg)   LMP  (LMP Unknown)   SpO2 98%   BMI 22.72 kg/m    Physical Exam:  General- No distress,  A&Ox3, appropriate ENT: No sinus tenderness, TM clear, pale nasal mucosa, no oral exudate,no post nasal drip, no LAN Cardiac: S1, S2, regular rate and rhythm, no murmur Chest: No wheeze/ rales/ dullness; no accessory muscle use, no nasal flaring, no sternal retractions, slightly diminished per bases Abd.: Soft  Non-tender, ND, BS +, Body mass index is 22.72 kg/m. Ext: No clubbing cyanosis, edema Neuro:  Frail and deconditioned at baseline, MAE x 4, A&O x 3 Skin: No rashes,No lesions warm and dry Psych: normal mood and behavior   Assessment/Plan Plan Rhinitis Flare Allergy index Very HIgh  Plan Please continue nasal saline rinses twice a day Continue to use your ipratropium nasal spray and fluticasone nasal spray as you have been doing. Continue your chlorpheniramine tabs as you have been taking it. Try stopping your cetirizine.( Zyrtec)   Start loratadine 10 mg once daily.  This is affordable at the [facility] and [facility] outpatient pharmacies. Continue to practice your swallowing precautions to avoid aspiration with meals. Continue Delsym for cough We will do a CBC and CXR today. We will call you with results  You can add Visine Allergy relief , Over the counter at the drug store Do this afew hours before or after your prescribed eye drops. Try Robitussin over the counter for cough. Remember to be careful, and stop any medication that makes you dizzy.  Remember to leave shoes outside during pollen season, and wash hands upon coming inside. Check the allergy index daily, and avoid going outside on days the allergy index is high. Please contact office for sooner follow up if symptoms do not improve or worsen or seek emergency care  Consider allergy consult at follow up.  Follow up Dr. Lamonte Sakai  07/2020 or before as needed.     Magdalen Spatz, NP 06/08/2020  12:50 PM

## 2020-06-08 NOTE — Telephone Encounter (Signed)
Called and spoke with patient, advised Robin Harrell will see her at 12 pm today and to arrive by 11:45 pm for check in.  She verified understanding.  Nothing further needed.

## 2020-06-08 NOTE — Patient Instructions (Addendum)
It is good to see you today Please continue nasal saline rinses twice a day Continue to use your ipratropium nasal spray and fluticasone nasal spray as you have been doing. Continue your chlorpheniramine tabs as you have been taking it. Try stopping your cetirizine.( Zyrtec)   Start loratadine 10 mg once daily.  This is affordable at the [facility] and [facility] outpatient pharmacies. Continue to practice your swallowing precautions to avoid aspiration with meals. Continue Delsym for cough We will do a CBC and CXR today. We will call you with results  You can add Visine Allergy relief , Over the counter at the drug store Do this afew hours before or after your prescribed eye drops. Try Robitussin over the counter for cough. Remember to be careful, and stop any medication that makes you dizzy.  Remember to leave shoes outside during pollen season, and wash hands upon coming inside. Check the allergy index daily, and avoid going outside on days the allergy index is high. Please contact office for sooner follow up if symptoms do not improve or worsen or seek emergency care  Consider allergy consult at follow up.  Follow up Dr. Lamonte Sakai  07/2020 or before as needed.

## 2020-06-08 NOTE — Progress Notes (Signed)
WBC is normal, no need for antibiotics.  Please call patient and let her know. Also let her know that the CXR has not been read, and that we will call results once it has been read.

## 2020-06-09 NOTE — Progress Notes (Signed)
Please call patient and let her know her CXR did not show a pneumonia. Have her follow the instructions we gave her inn the office yesterday. Thanks so much

## 2020-06-15 ENCOUNTER — Ambulatory Visit (INDEPENDENT_AMBULATORY_CARE_PROVIDER_SITE_OTHER): Payer: Medicare Other

## 2020-06-15 ENCOUNTER — Telehealth: Payer: Self-pay | Admitting: Emergency Medicine

## 2020-06-15 ENCOUNTER — Other Ambulatory Visit: Payer: Self-pay

## 2020-06-15 DIAGNOSIS — Z7901 Long term (current) use of anticoagulants: Secondary | ICD-10-CM | POA: Diagnosis not present

## 2020-06-15 LAB — POCT INR: INR: 2.6 (ref 2.0–3.0)

## 2020-06-15 NOTE — Patient Instructions (Signed)
Continue taking 1 tablet daily except for 1/2 tablet each Monday, Wednesday and Friday.  Repeat INR in 6 weeks.

## 2020-06-16 NOTE — Telephone Encounter (Signed)
Patient does not accept blocked numbers

## 2020-06-16 NOTE — Telephone Encounter (Signed)
I called and spoke with the pt and notified her of labs and cxr results. She verbalized understanding. Nothing further needed.   WBC is normal, no need for antibiotics.  Please call patient and let her know. Also let her know that the CXR has not been read, and that we will call results once it has been read.   Please call patient and let her know her CXR did not show a pneumonia. Have her follow the instructions we gave her inn the office yesterday. Thanks so much

## 2020-06-16 NOTE — Telephone Encounter (Signed)
Robin Spatz, NP  06/09/2020 11:58 AM EDT      Please call patient and let her know her CXR did not show a pneumonia. Have her follow the instructions we gave her inn the office yesterday. Thanks so much

## 2020-06-27 ENCOUNTER — Other Ambulatory Visit: Payer: Self-pay | Admitting: Family Medicine

## 2020-07-03 ENCOUNTER — Encounter: Payer: Self-pay | Admitting: Podiatry

## 2020-07-03 ENCOUNTER — Other Ambulatory Visit: Payer: Self-pay

## 2020-07-03 ENCOUNTER — Ambulatory Visit (INDEPENDENT_AMBULATORY_CARE_PROVIDER_SITE_OTHER): Payer: Medicare Other | Admitting: Podiatry

## 2020-07-03 DIAGNOSIS — L84 Corns and callosities: Secondary | ICD-10-CM

## 2020-07-03 DIAGNOSIS — B351 Tinea unguium: Secondary | ICD-10-CM | POA: Diagnosis not present

## 2020-07-03 DIAGNOSIS — M79676 Pain in unspecified toe(s): Secondary | ICD-10-CM | POA: Diagnosis not present

## 2020-07-03 DIAGNOSIS — D689 Coagulation defect, unspecified: Secondary | ICD-10-CM

## 2020-07-03 DIAGNOSIS — D6851 Activated protein C resistance: Secondary | ICD-10-CM

## 2020-07-03 MED ORDER — MUPIROCIN 2 % EX OINT: TOPICAL_OINTMENT | CUTANEOUS | 1 refills | Status: DC

## 2020-07-03 NOTE — Patient Instructions (Signed)
DRESSING CHANGES R 2nd toe:   PHARMACY SHOPPING LIST: 1. Saline or Wound Cleanser for cleaning wound 2. 2 x 2 inch sterile gauze for cleaning wound 3. FABRIC BAND-AIDS 4. Mupirocin Ointment (Prescription)   1. KEEP R 2nd toe DRY AT ALL TIMES!!!!  2. CLEANSE ULCER WITH SALINE OR WOUND CLEANSER.  3. DAB DRY WITH GAUZE SPONGE.  4. APPLY A LIGHT AMOUNT OF Mupirocin Ointment TO RIGHT 2ND TOE.  5. APPLY FABRIC BAND-AID TO TIP OF RIGHT 2ND TOE. APPLY SILICONE TOE CAP OVER BAND-AID FOR PROTECTION.  6. IF YOU EXPERIENCE ANY FEVER, CHILLS, NIGHTSWEATS, NAUSEA OR VOMITING, ELEVATED OR LOW BLOOD SUGARS, REPORT TO EMERGENCY ROOM.  9. IF YOU EXPERIENCE INCREASED REDNESS, PAIN, SWELLING, DISCOLORATION, ODOR, PUS, DRAINAGE OR WARMTH OF YOUR FOOT, REPORT TO EMERGENCY ROOM.  FOLLOW UP 2 WEEKS FOR RIGHT 2ND TOE.Marland Kitchen

## 2020-07-09 NOTE — Progress Notes (Signed)
  Subjective:  Patient ID: Robin Harrell, female    DOB: 1933-09-04,  MRN: [id number]  87 y.o. female presents at risk foot care with h/o clotting disorder and painful porokeratotic lesion(s) right 2nd digit and painful mycotic toenails that limit ambulation. Painful toenails interfere with ambulation. Aggravating factors include wearing enclosed shoe gear. Pain is relieved with periodic professional debridement. Painful porokeratotic lesions are aggravated when weightbearing with and without shoegear. Pain is relieved with periodic professional debridement..    Allergies  Allergen Reactions  . Azithromycin Nausea Only  . Codeine Nausea Only  . Erythromycin Nausea And Vomiting    Review of Systems: Negative except as noted in the HPI.   Objective:   Constitutional Pt is a pleasant 87 y.o. Caucasian female frail, in NAD. AAO x 3.   Vascular Capillary fill time to digits <3 seconds b/l lower extremities. Palpable pedal pulses b/l LE. Pedal hair absent. Lower extremity skin temperature gradient within normal limits. No pain with calf compression b/l. Varicosities present b/l. No ischemia or gangrene noted b/l lower extremities.  Neurologic Protective sensation intact 5/5 intact bilaterally with 10g monofilament b/l.  Dermatologic Pedal skin with normal turgor, texture and tone bilaterally. No open wounds bilaterally. No interdigital macerations bilaterally. Toenails 1-5 b/l elongated, discolored, dystrophic, thickened, crumbly with subungual debris and tenderness to dorsal palpation. Preulcerative lesion noted R 2nd toe. Measures 0.5 x 0.5 cm pre-debridement. Postdebridement measures 0.5 x 0.5 cm. There is visible subdermal hemorrhage. There is no surrounding erythema, no edema, no drainage, no odor, no fluctuance.  Orthopedic: Normal muscle strength 5/5 to all lower extremity muscle groups bilaterally. No pain crepitus or joint limitation noted with ROM b/l. Hammertoe(s) noted to the 2-5  bilaterally.   Radiographs: None Assessment:   1. Pain due to onychomycosis of toenail   2. Pre-ulcerative corn or callous   3. Factor V Leiden ([place])   4. Coagulation defect ([place])    Plan:  Patient was evaluated and treated and all questions answered.  Onychomycosis with pain -Nails palliatively debridement as below. -Educated on self-care  Procedure: Nail Debridement Rationale: Pain Type of Debridement: manual, sharp debridement. Instrumentation: Nail nipper, rotary burr. Number of Nails: 10  -Examined patient. -Patient to continue soft, supportive shoe gear daily. -Toenails 1-5 b/l were debrided in length and girth with sterile nail nippers and dremel without iatrogenic bleeding.  -Preulcerative lesion pared R 2nd toe. Applied triple antibiotic ointment and band-aid. Applied toe cap. Rx sent to pharmacy for Mupirocin Ointment to be applied once daily.Written instructions dispensed. -Patient to report any pedal injuries to medical professional immediately. -Patient/POA to call should there be question/concern in the interim.  Return in about 2 weeks (around 07/17/2020).  Marzetta Board, DPM

## 2020-07-13 ENCOUNTER — Ambulatory Visit (INDEPENDENT_AMBULATORY_CARE_PROVIDER_SITE_OTHER): Payer: Medicare Other | Admitting: Podiatry

## 2020-07-13 ENCOUNTER — Encounter: Payer: Self-pay | Admitting: Podiatry

## 2020-07-13 ENCOUNTER — Other Ambulatory Visit: Payer: Self-pay

## 2020-07-13 DIAGNOSIS — M2041 Other hammer toe(s) (acquired), right foot: Secondary | ICD-10-CM | POA: Diagnosis not present

## 2020-07-13 DIAGNOSIS — L84 Corns and callosities: Secondary | ICD-10-CM

## 2020-07-15 NOTE — Progress Notes (Signed)
  Subjective:  Patient ID: Robin Harrell, female    DOB: March 05, 1933,  MRN: [id number]  Robin Harrell presents to clinic today for follow up preulcerative lesion distal tip right 2nd toe. Patient states toe feels better today. She states her toe cap is worn and split and feels it no longer provides protection.  She is accompanied by her caregiver on today's visit. She states caregiver comes to assist her on Monday and Wednesday. She feels she cannot dress/protect her toe due to limitations of not being able to reach her feet.  She would also like to know what "blister spots" have developed on her left 4th and left 2nd toes.  She denies any redness, drainage or swelling of digit today.  Allergies  Allergen Reactions  . Azithromycin Nausea Only  . Codeine Nausea Only  . Erythromycin Nausea And Vomiting    Review of Systems: Negative except as noted in the HPI. Objective:   Constitutional Robin Harrell is a pleasant 87 y.o. Caucasian female, frail, in NAD. AAO x 3.   Vascular Capillary fill time to digits <3 seconds b/l lower extremities. Palpable pedal pulses b/l LE. Pedal hair absent. Lower extremity skin temperature gradient within normal limits. Varicosities present b/l. No cyanosis or clubbing noted.  Neurologic Normal speech. Oriented to person, place, and time. Protective sensation intact 5/5 intact bilaterally with 10g monofilament b/l.  Dermatologic Pedal skin with normal turgor, texture and tone bilaterally. No open wounds bilaterally. No interdigital macerations bilaterally. Distal tip right 2nd toe with mild hyperkeratosis with visible subdermal hemorrhage. No pain on palpation today. Improved from last visit. No cellulitis, no drainage, no fluctuance. She does have dorsal areas of pressure at left 2nd and left 4th PIPJs from rubbing of some shoe source. No blister formation, but minimal hyperkeratosis, not enough to pare.  Orthopedic: Normal muscle strength 5/5 to all  lower extremity muscle groups bilaterally. No pain crepitus or joint limitation noted with ROM b/l. Hammertoe(s) noted to the 2-5 bilaterally. She is wearing silver leather sandals with two leather straps with velcro closures. I suspect these leather straps are rubbing the top of her left 4th and left 2nd toes.   Radiographs: None Assessment:   1. Pre-ulcerative corn or callous   2. Hammer toe of right foot    Plan:  Patient was evaluated and treated and all questions answered. -Examined patient. -Patient to continue soft, supportive shoe gear daily. -Preulcerative lesion pared R 2nd toe.  -Patient to report any pedal injuries to medical professional immediately. -Advised patient to discontinue wearing any leather shoe gear or shoes that do not stretch over her hammertoe deformities. Her right 2nd toe is much improved today.  Recommended her caregiver changes dressing on right 2nd toe on MWF. I demonstrated applying dot fabric band-aid to tip of digit, then applying the new silicone toe pad. Both patient and caregiver related understanding. -Patient/POA to call should there be question/concern in the interim.  Return in about 2 weeks (around 07/27/2020).  Marzetta Board, DPM

## 2020-07-16 DIAGNOSIS — H401132 Primary open-angle glaucoma, bilateral, moderate stage: Secondary | ICD-10-CM | POA: Diagnosis not present

## 2020-07-27 ENCOUNTER — Ambulatory Visit (INDEPENDENT_AMBULATORY_CARE_PROVIDER_SITE_OTHER): Payer: Medicare Other

## 2020-07-27 ENCOUNTER — Other Ambulatory Visit: Payer: Self-pay

## 2020-07-27 DIAGNOSIS — Z7901 Long term (current) use of anticoagulants: Secondary | ICD-10-CM

## 2020-07-27 LAB — POCT INR: INR: 2.4 (ref 2.0–3.0)

## 2020-07-27 NOTE — Patient Instructions (Signed)
Continue taking 1 tablet daily except for 1/2 tablet each Monday, Wednesday and Friday.  Repeat INR in 6 weeks.

## 2020-07-28 ENCOUNTER — Telehealth: Payer: Self-pay | Admitting: Podiatry

## 2020-07-28 NOTE — Telephone Encounter (Signed)
Pt left message confirming appt on Friday 6.10 @ 1015am.

## 2020-07-31 ENCOUNTER — Ambulatory Visit: Payer: Medicare Other | Admitting: Podiatry

## 2020-08-03 ENCOUNTER — Encounter: Payer: Self-pay | Admitting: Podiatry

## 2020-08-03 ENCOUNTER — Other Ambulatory Visit: Payer: Self-pay

## 2020-08-03 ENCOUNTER — Ambulatory Visit (INDEPENDENT_AMBULATORY_CARE_PROVIDER_SITE_OTHER): Payer: Medicare Other | Admitting: Podiatry

## 2020-08-03 DIAGNOSIS — L84 Corns and callosities: Secondary | ICD-10-CM | POA: Diagnosis not present

## 2020-08-03 DIAGNOSIS — M79674 Pain in right toe(s): Secondary | ICD-10-CM | POA: Diagnosis not present

## 2020-08-07 NOTE — Progress Notes (Signed)
  Subjective:  Patient ID: Robin Harrell, female    DOB: Jun 28, 1933,  MRN: [id number]  Robin Harrell presents to clinic today for follow up preulcerative lesion distal tip right 2nd toe. Patient states toe is still sore on today .   She is accompanied by her caregiver on today's visit. Her caregiver is changing her dressing on Monday, Wednesday and Friday.  She denies any redness, drainage or swelling of digit today.  Allergies  Allergen Reactions   Azithromycin Nausea Only   Codeine Nausea Only   Erythromycin Nausea And Vomiting    Review of Systems: Negative except as noted in the HPI. Objective:   Constitutional Robin Harrell is a pleasant 87 y.o. Caucasian female, frail, in NAD. AAO x 3.   Vascular Capillary fill time to digits <3 seconds b/l lower extremities. Palpable pedal pulses b/l LE. Pedal hair absent. Lower extremity skin temperature gradient within normal limits. Varicosities present b/l. No cyanosis or clubbing noted.  Neurologic Normal speech. Oriented to person, place, and time. Protective sensation intact 5/5 intact bilaterally with 10g monofilament b/l.  Dermatologic Pedal skin with normal turgor, texture and tone bilaterally. No open wounds bilaterally. No interdigital macerations bilaterally. Distal tip right 2nd toe with mild hyperkeratosis with visible subdermal hemorrhage. No pain on palpation today. Improved from last visit. No cellulitis, no drainage, no fluctuance. She does have dorsal areas of pressure at left 2nd and left 4th PIPJs from rubbing of some shoe source. No blister formation, but minimal hyperkeratosis, not enough to pare.  Orthopedic: Normal muscle strength 5/5 to all lower extremity muscle groups bilaterally. No pain crepitus or joint limitation noted with ROM b/l. Hammertoe(s) noted to the 2-5 bilaterally.    Radiographs: None Assessment:   No diagnosis found.  Plan:  Patient was evaluated and treated and all questions  answered. -Examined patient. -Patient to continue soft, supportive shoe gear daily. -Preulcerative lesion pared R 2nd toe.  -Patient to report any pedal injuries to medical professional immediately. -Advised caregiver  to continue dressing changes on right 2nd toe on MWF. We will try wrapping lightly in coban for cushion effect to distal aspect of digit. I will have Dr. Sherryle Lis see her for recommendations. -Patient/POA to call should there be question/concern in the interim.  No follow-ups on file.  Marzetta Board, DPM

## 2020-08-19 ENCOUNTER — Telehealth: Payer: Self-pay

## 2020-08-19 NOTE — Chronic Care Management (AMB) (Signed)
  Chronic Care Management   Note  08/19/2020 Name: KALANI BARAY MRN: [id number] DOB: 1933/07/31  Merilyn Baba Pyeatt is a 87 y.o. year old female who is a primary care patient of [name], MD. I reached out to [employer] by phone today in response to a referral sent by Ms. Redmond School, MD     Ms. Stonerock was given information about Chronic Care Management services today including:  CCM service includes personalized support from designated clinical staff supervised by her physician, including individualized plan of care and coordination with other care providers 24/7 contact phone numbers for assistance for urgent and routine care needs. Service will only be billed when office clinical staff spend 20 minutes or more in a month to coordinate care. Only one practitioner may furnish and bill the service in a calendar month. The patient may stop CCM services at any time (effective at the end of the month) by phone call to the office staff. The patient will be responsible for cost sharing (co-pay) of up to 20% of the service fee (after annual deductible is met).  Patient agreed to services and verbal consent obtained.   Follow up plan: Telephone appointment with care management team member scheduled for:08/27/2020  Noreene Larsson, [address], [place], [place] [address] Direct Dial: [phone] [email or web address]: [email or web address]

## 2020-08-20 ENCOUNTER — Ambulatory Visit (INDEPENDENT_AMBULATORY_CARE_PROVIDER_SITE_OTHER): Payer: Medicare Other | Admitting: Podiatry

## 2020-08-20 ENCOUNTER — Other Ambulatory Visit: Payer: Self-pay

## 2020-08-20 ENCOUNTER — Encounter: Payer: Self-pay | Admitting: Podiatry

## 2020-08-20 DIAGNOSIS — M79674 Pain in right toe(s): Secondary | ICD-10-CM | POA: Diagnosis not present

## 2020-08-20 DIAGNOSIS — L84 Corns and callosities: Secondary | ICD-10-CM

## 2020-08-20 DIAGNOSIS — M2041 Other hammer toe(s) (acquired), right foot: Secondary | ICD-10-CM

## 2020-08-20 NOTE — Progress Notes (Signed)
  Subjective:  Patient ID: Robin Harrell, female    DOB: 12-05-1933,  MRN: [id number]  Chief Complaint  Patient presents with   Foot Ulcer      2w toe check right 2nd toe   per [place]    87 y.o. female presents with the above complaint. History confirmed with patient.  Returns to me for evaluation of continued preulcerative callus on the second toe tip which I have previously done a tenotomy on  Objective:  Physical Exam: warm, good capillary refill,  normal DP and PT pulses, and normal sensory exam.  Preulcerative callus at the distal tip of the second toe with semirigid hammertoe deformity  Assessment:   1. Pre-ulcerative corn or callous   2. Pain in toe of right foot   3. Hammer toe of right foot      Plan:  Patient was evaluated and treated and all questions answered.  Evaluated and discussed with her I do not think that repeat flexor tenotomy would be advisable, the semirigid nature of the deformity at this point likely is not to correct with soft tissue work.  The only thing I can offer her is an arthroplasty which I discussed the risk, benefits and potential complications of.  She would have to bridge off her warfarin for factor V Leiden deficiency.  She will consider this.  We also discussed the postoperative course.  She will let me know if she is interested in surgical correction and return to see me as needed for this  Return in about 4 weeks (around 09/17/2020).

## 2020-08-27 ENCOUNTER — Ambulatory Visit (INDEPENDENT_AMBULATORY_CARE_PROVIDER_SITE_OTHER): Payer: Medicare Other | Admitting: *Deleted

## 2020-08-27 DIAGNOSIS — M8949 Other hypertrophic osteoarthropathy, multiple sites: Secondary | ICD-10-CM

## 2020-08-27 DIAGNOSIS — M810 Age-related osteoporosis without current pathological fracture: Secondary | ICD-10-CM | POA: Diagnosis not present

## 2020-08-27 DIAGNOSIS — I5189 Other ill-defined heart diseases: Secondary | ICD-10-CM

## 2020-08-27 DIAGNOSIS — M159 Polyosteoarthritis, unspecified: Secondary | ICD-10-CM

## 2020-08-27 DIAGNOSIS — I872 Venous insufficiency (chronic) (peripheral): Secondary | ICD-10-CM

## 2020-08-27 DIAGNOSIS — R600 Localized edema: Secondary | ICD-10-CM

## 2020-08-27 NOTE — Patient Instructions (Signed)
Visit Information   PATIENT GOALS:   Goals Addressed             This Visit's Progress    (RNCM) Manage Chronic Pain-right 2nd toe       Timeframe:  Long-Range Goal Priority:  Medium Start Date:  08/27/2020                           Expected End Date: 01/20/21                      Follow Up Date 09/29/20    Develop a personal pain management plan Plan exercise or activity when pain is best controlled Prioritize tasks for the day Track times pain is worst and when it is best Track what makes the pain worse and what makes it better Use ice or heat for pain relief Work slower and less intense when having pain  Discuss timing of surgery with provider   Why is this important?   Day-to-day life can be hard when you have chronic pain.  Pain medicine is just one piece of the treatment puzzle.  You can try these action steps to help you manage your pain.    Notes:       (RNCM) Track and Manage Fluids and Swelling-Heart Failure       Timeframe:  Long-Range Goal Priority:  Medium Start Date:    08/27/2020                         Expected End Date: 02/19/2021                      Follow Up Date 09/29/2020    Call office if I gain more than 2 pounds in one day or 5 pounds in one week Keep legs up while sitting Use salt in moderation Watch for swelling in feet, ankles and legs every day Weigh myself daily and record in log, taking log for provider review   Why is this important?   It is important to check your weight daily and watch how much salt and liquids you have.  It will help you to manage your heart failure.    Notes:          Consent to CCM Services: Ms. Maxfield was given information about Chronic Care Management services today including:  CCM service includes personalized support from designated clinical staff supervised by her physician, including individualized plan of care and coordination with other care providers 24/7 contact phone numbers for assistance for  urgent and routine care needs. Service will only be billed when office clinical staff spend 20 minutes or more in a month to coordinate care. Only one practitioner may furnish and bill the service in a calendar month. The patient may stop CCM services at any time (effective at the end of the month) by phone call to the office staff. The patient will be responsible for cost sharing (co-pay) of up to 20% of the service fee (after annual deductible is met).  Patient agreed to services and verbal consent obtained.   Patient verbalizes understanding of instructions provided today and agrees to view in [place].   The care management team will reach out to the patient again over the next 45 business days.   [name] RN, MSN RN Care Management Coordinator  [facility] [phone] [email or web address]   CLINICAL CARE  PLAN: Patient Care Plan: Heart Failure (Adult)     Problem Identified: Symptom Exacerbation (Heart Failure)   Priority: Medium     Long-Range Goal: Symptom Exacerbation Prevented or Minimized   Start Date: 08/27/2020  Expected End Date: 02/19/2021  Priority: Medium  Note:   Current Barriers:  Knowledge deficit related to basic heart failure pathophysiology and self care management patient with history of diastolic dysfunction and chronic lower extremity edema.  States she does weigh herself daily.  Weight this morning was 116.8.  Reports some swelling in lower extremities, but says it is the same as usual.  Denies any shortness of breath Lacks reliable transportation; patient and spouse are no longer able to drive and are dependent upon friends for transportation to medical appointments; no family in town; Care guide referral placed for transportation resources Case Manager Clinical Goal(s):  patient will weigh self daily and record patient will verbalize understanding of Heart Failure Action Plan and when to call doctor patient will  weigh daily and record (notifying MD of 3 lb weight gain over night or 5 lb in a week) Interventions:  Collaboration with [name], MD regarding development and update of comprehensive plan of care as evidenced by provider attestation and co-signature Inter-disciplinary care team collaboration (see longitudinal plan of care) Basic overview and discussion of pathophysiology of Heart Failure reviewed  Provided verbal education on low sodium diet Provided written education on low sodium diet Reviewed Heart Failure Action Plan in depth and provided written copy Advised patient to weigh each morning after emptying bladder Discussed importance of daily weight and advised patient to weigh and record daily; discussed when to call provider based on weight. Barriers to lifestyle changes reviewed and addressed and barriers to treatment reviewed and addressed Depression screen reviewed Healthy lifestyle promoted Medication-adherence assessment completed Self-awareness of signs/symptoms of worsening disease encouraged Encouraged to attend all scheduled provider appointments Encouraged to call provider office for new concerns or questions Sending Living Better with Heart Failure Education Packet Patient Goals/Self-Care Activities: Call office if I gain more than 2 pounds in one day or 5 pounds in one week Keep legs up while sitting Use salt in moderation Watch for swelling in feet, ankles and legs every day Weigh myself daily and record in log, taking log for provider review Follow Up Plan: The care management team will reach out to the patient again over the next 45 business days.     Patient Care Plan: Chronic Pain (Adult)     Problem Identified: Chronic Pain Management (Chronic Pain)   Priority: Medium     Long-Range Goal: Chronic Pain Managed   Start Date: 08/27/2020  Expected End Date: 01/20/2021  Priority: Medium  Note:   Current Barriers:  Knowledge Deficits related to  self-health management of chronic pain of right 2nd toe; patient reporting she knows she needs surgery but is wanting to wait a little while.  Denies any open wounds, smell or drainage from toe.  Reports pain is controlled with occasional need to take Tylenol Chronic Disease Management support and education needs related to chronic pain Lacks caregiver support. Has hired caregiver that assist patient twice a week.  All children live out of town. Transportation barriers; no longer drives; Care Guide referral placed for transportation options Clinical Goal(s):  patient will verbalize understanding of plan for pain management. , patient will demonstrate use of different relaxation  skills and/or diversional activities to assist with pain reduction (distraction, imagery, relaxation, massage, acupressure, TENS, heat, and  cold application., patient will report pain at a level less than 3 to 4 on a 10-10 rating scale., patient will use pharmacological and nonpharmacological pain relief strategies as prescribed. , patient will verbalize acceptable level of pain relief and ability to engage in desired activities, and patient will engage in desired activities without an increase in pain level Interventions:  Collaboration with [name], MD regarding development and update of comprehensive plan of care as evidenced by provider attestation and co-signature Pain assessment performed Medications reviewed Discussed plans with patient for ongoing care management follow up and provided patient with direct contact information for care management team Evaluation of current treatment plan related to toe pain and patient's adherence to plan as established by provider. Care Guide referral for transportation option resources Careful application of heat or ice encouraged Deep breathing, relaxation and mindfulness use promoted Motivation and barriers to change assessed and addressed Pain assessed and pain treatment  goals reviewed Premedication prior to activity encouraged Assessed pain using self-report (most reliable), family/caregiver report, validated pain scale; considered impact on quality of life Encouraged patient to discuss surgery options with provider Emotional support and empathy provided to patient Patient Goals/Self Care Activities:  Develop a personal pain management plan Plan exercise or activity when pain is best controlled Prioritize tasks for the day Track times pain is worst and when it is best Track what makes the pain worse and what makes it better Use ice or heat for pain relief Work slower and less intense when having pain  Discuss timing of surgery with provider Patient will calls provider office for new concerns or questions Follow Up Plan: The care management team will reach out to the patient again over the next 45 business days.

## 2020-08-27 NOTE — Chronic Care Management (AMB) (Signed)
Chronic Care Management   CCM RN Visit Note  08/27/2020 Name: Robin Harrell MRN: [id number] DOB: 1933-04-02  Subjective: Robin Harrell is a 87 y.o. year old female who is a primary care patient of [name], MD. The care management team was consulted for assistance with disease management and care coordination needs.    Engaged with patient by telephone for initial visit in response to provider referral for case management and/or care coordination services.   Consent to Services:  The patient was given the following information about Chronic Care Management services today, agreed to services, and gave verbal consent: 1. CCM service includes personalized support from designated clinical staff supervised by the primary care provider, including individualized plan of care and coordination with other care providers 2. 24/7 contact phone numbers for assistance for urgent and routine care needs. 3. Service will only be billed when office clinical staff spend 20 minutes or more in a month to coordinate care. 4. Only one practitioner may furnish and bill the service in a calendar month. 5.The patient may stop CCM services at any time (effective at the end of the month) by phone call to the office staff. 6. The patient will be responsible for cost sharing (co-pay) of up to 20% of the service fee (after annual deductible is met). Patient agreed to services and consent obtained.  Patient agreed to services and verbal consent obtained.   Assessment: Review of patient past medical history, allergies, medications, health status, including review of consultants reports, laboratory and other test data, was performed as part of comprehensive evaluation and provision of chronic care management services.   SDOH (Social Determinants of Health) assessments and interventions performed:  SDOH Interventions    Flowsheet Row Most Recent Value  SDOH Interventions   Food Insecurity Interventions  Intervention Not Indicated  Financial Strain Interventions Intervention Not Indicated  Housing Interventions Intervention Not Indicated  Intimate Partner Violence Interventions Intervention Not Indicated  Transportation Interventions Other (Comment)  [care guide referral placed for transportation options]        CCM Care Plan  Allergies  Allergen Reactions   Azithromycin Nausea Only   Codeine Nausea Only   Erythromycin Nausea And Vomiting    Outpatient Encounter Medications as of 08/27/2020  Medication Sig   acetaminophen (TYLENOL) 500 MG tablet Take 1,000 mg by mouth every 6 (six) hours as needed for moderate pain.   atorvastatin (LIPITOR) 10 MG tablet Take 1 tablet (10 mg total) by mouth daily at 6 PM.   brimonidine (ALPHAGAN) 0.15 % ophthalmic solution Place 2 drops into both eyes 2 (two) times daily.    Calcium-Phosphorus-Vitamin D (CALCIUM/D3 ADULT GUMMIES) 200-96.6-200 MG-MG-UNIT CHEW Chew 2 tablets by mouth daily.   chlorpheniramine (CHLOR-TRIMETON) 4 MG tablet Take 4 mg by mouth every morning.    fluticasone (FLONASE) 50 MCG/ACT nasal spray SPRAY 2 SPRAYS INTO EACH NOSTRIL EVERY DAY   ipratropium (ATROVENT) 0.03 % nasal spray Place 2 sprays into both nostrils 3 (three) times daily as needed for rhinitis.   latanoprost (XALATAN) 0.005 % ophthalmic solution Place 1 drop into both eyes at bedtime.    levothyroxine (SYNTHROID) 25 MCG tablet TAKE 1 TABLET BY MOUTH DAILY BEFORE BREAKFAST.   loratadine (CLARITIN) 10 MG tablet Take 1 tablet (10 mg total) by mouth daily.   losartan (COZAAR) 50 MG tablet Take 1 tablet (50 mg total) by mouth daily.   metoprolol tartrate (LOPRESSOR) 25 MG tablet Take 1 tablet (25 mg total) by mouth 2 (  two) times daily.   multivitamin-iron-minerals-folic acid (CENTRUM) chewable tablet Chew 1 tablet by mouth daily.   vitamin E 1000 UNIT capsule Take 1,000 Units by mouth daily.   warfarin (COUMADIN) 7.5 MG tablet Full tablet Sunday, Tuesday, Thursday, and  Saturday 1/2 tablet Monday, Wednesday, and Friday   brimonidine (ALPHAGAN) 0.2 % ophthalmic solution SMARTSIG:In Eye(s)   mupirocin ointment (BACTROBAN) 2 % APPLY TO RIGHT 2ND TOE ONCE DAILY. (Patient not taking: Reported on 08/27/2020)   No facility-administered encounter medications on file as of 08/27/2020.    Patient Active Problem List   Diagnosis Date Noted   Primary osteoarthritis of both knees 11/18/2019   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 01/29/2019   Chronic venous stasis dermatitis of both lower extremities 12/31/2018   Aortic stenosis, mild 02/07/2017   History of recurrent UTIs 01/26/2017   Chronic idiopathic thrombocytopenia ([place]) 04/14/2016   Essential tremor 09/03/2015   History of respiratory failure 11/11/2014   Hypothyroidism (acquired) 09/02/2014   Edema of right lower extremity 02/01/2013   Hyperlipidemia 11/19/2012   Colon polyp 10/15/2012   Long term (current) use of anticoagulants 05/10/2012   Insomnia 10/11/2011   Osteoarthritis, multiple sites 72/82/0601   Diastolic dysfunction 56/15/3794   Factor V deficiency ([place]) 02/09/2010   Osteoporosis 02/09/2010   DJD (degenerative joint disease), lumbar 10/14/2009   Glaucoma 01/29/2008   Essential hypertension 11/15/2007   History of DVT (deep vein thrombosis) 11/15/2007   Seasonal and perennial allergic rhinitis 11/15/2007   GERD 11/15/2007   COUGH, CHRONIC 11/15/2007    Conditions to be addressed/monitored:CHF and Pain  Care Plan : Heart Failure (Adult)  Updates made by Leona Singleton, RN since 08/27/2020 12:00 AM     Problem: Symptom Exacerbation (Heart Failure)   Priority: Medium     Long-Range Goal: Symptom Exacerbation Prevented or Minimized   Start Date: 08/27/2020  Expected End Date: 02/19/2021  Priority: Medium  Note:   Current Barriers:  Knowledge deficit related to basic heart failure pathophysiology and self care management patient with history of diastolic dysfunction and chronic lower  extremity edema.  States she does weigh herself daily.  Weight this morning was 116.8.  Reports some swelling in lower extremities, but says it is the same as usual.  Denies any shortness of breath Lacks reliable transportation; patient and spouse are no longer able to drive and are dependent upon friends for transportation to medical appointments; no family in town; Care guide referral placed for transportation resources Case Manager Clinical Goal(s):  patient will weigh self daily and record patient will verbalize understanding of Heart Failure Action Plan and when to call doctor patient will weigh daily and record (notifying MD of 3 lb weight gain over night or 5 lb in a week) Interventions:  Collaboration with [name], MD regarding development and update of comprehensive plan of care as evidenced by provider attestation and co-signature Inter-disciplinary care team collaboration (see longitudinal plan of care) Basic overview and discussion of pathophysiology of Heart Failure reviewed  Provided verbal education on low sodium diet Provided written education on low sodium diet Reviewed Heart Failure Action Plan in depth and provided written copy Advised patient to weigh each morning after emptying bladder Discussed importance of daily weight and advised patient to weigh and record daily; discussed when to call provider based on weight. Barriers to lifestyle changes reviewed and addressed and barriers to treatment reviewed and addressed Depression screen reviewed Healthy lifestyle promoted Medication-adherence assessment completed Self-awareness of signs/symptoms of worsening disease encouraged  Encouraged to attend all scheduled provider appointments Encouraged to call provider office for new concerns or questions Sending Living Better with Heart Failure Education Packet Patient Goals/Self-Care Activities: Call office if I gain more than 2 pounds in one day or 5 pounds in one  week Keep legs up while sitting Use salt in moderation Watch for swelling in feet, ankles and legs every day Weigh myself daily and record in log, taking log for provider review Follow Up Plan: The care management team will reach out to the patient again over the next 45 business days.     Care Plan : Chronic Pain (Adult)  Updates made by Leona Singleton, RN since 08/27/2020 12:00 AM     Problem: Chronic Pain Management (Chronic Pain)   Priority: Medium     Long-Range Goal: Chronic Pain Managed   Start Date: 08/27/2020  Expected End Date: 01/20/2021  Priority: Medium  Note:   Current Barriers:  Knowledge Deficits related to self-health management of chronic pain of right 2nd toe; patient reporting she knows she needs surgery but is wanting to wait a little while.  Denies any open wounds, smell or drainage from toe.  Reports pain is controlled with occasional need to take Tylenol Chronic Disease Management support and education needs related to chronic pain Lacks caregiver support. Has hired caregiver that assist patient twice a week.  All children live out of town. Transportation barriers; no longer drives; Care Guide referral placed for transportation options Clinical Goal(s):  patient will verbalize understanding of plan for pain management. , patient will demonstrate use of different relaxation  skills and/or diversional activities to assist with pain reduction (distraction, imagery, relaxation, massage, acupressure, TENS, heat, and cold application., patient will report pain at a level less than 3 to 4 on a 10-10 rating scale., patient will use pharmacological and nonpharmacological pain relief strategies as prescribed. , patient will verbalize acceptable level of pain relief and ability to engage in desired activities, and patient will engage in desired activities without an increase in pain level Interventions:  Collaboration with [name], MD regarding development and update  of comprehensive plan of care as evidenced by provider attestation and co-signature Pain assessment performed Medications reviewed Discussed plans with patient for ongoing care management follow up and provided patient with direct contact information for care management team Evaluation of current treatment plan related to toe pain and patient's adherence to plan as established by provider. Care Guide referral for transportation option resources Careful application of heat or ice encouraged Deep breathing, relaxation and mindfulness use promoted Motivation and barriers to change assessed and addressed Pain assessed and pain treatment goals reviewed Premedication prior to activity encouraged Assessed pain using self-report (most reliable), family/caregiver report, validated pain scale; considered impact on quality of life Encouraged patient to discuss surgery options with provider Emotional support and empathy provided to patient Patient Goals/Self Care Activities:  Develop a personal pain management plan Plan exercise or activity when pain is best controlled Prioritize tasks for the day Track times pain is worst and when it is best Track what makes the pain worse and what makes it better Use ice or heat for pain relief Work slower and less intense when having pain  Discuss timing of surgery with provider Patient will calls provider office for new concerns or questions Follow Up Plan: The care management team will reach out to the patient again over the next 45 business days.       Plan:The care management  team will reach out to the patient again over the next 45 business days.  [name] RN, MSN RN Care Management Coordinator  [place] [phone] [email or web address]

## 2020-09-01 ENCOUNTER — Telehealth: Payer: Self-pay | Admitting: Family Medicine

## 2020-09-01 NOTE — Telephone Encounter (Signed)
   Telephone encounter was:  Successful.  09/01/2020 Name: NAJAH LIVERMAN MRN: [id number] DOB: 23-Nov-1933  Merilyn Baba Vitullo is a 87 y.o. year old female who is a primary care patient of [name], MD . The community resource team was consulted for assistance with Transportation Needs   Care guide performed the following interventions: Patient provided with information about care guide support team and interviewed to confirm resource needs Discussed resources to assist with [employer] .  Follow Up Plan:  No further follow up planned at this time. The patient has been provided with needed resources.  April Green Care Guide, Embedded Care Coordination [place], Care Management Phone: [phone] Email: [email or web address]

## 2020-09-04 DIAGNOSIS — Z20822 Contact with and (suspected) exposure to covid-19: Secondary | ICD-10-CM | POA: Diagnosis not present

## 2020-09-07 ENCOUNTER — Other Ambulatory Visit: Payer: Self-pay

## 2020-09-07 ENCOUNTER — Ambulatory Visit (INDEPENDENT_AMBULATORY_CARE_PROVIDER_SITE_OTHER): Payer: Medicare Other

## 2020-09-07 DIAGNOSIS — Z7901 Long term (current) use of anticoagulants: Secondary | ICD-10-CM | POA: Diagnosis not present

## 2020-09-07 LAB — POCT INR: INR: 2.4 (ref 2.0–3.0)

## 2020-09-07 NOTE — Patient Instructions (Signed)
Continue taking 1 tablet daily except for 1/2 tablet each Monday, Wednesday and Friday.  Repeat INR in 6 weeks.

## 2020-09-11 ENCOUNTER — Other Ambulatory Visit: Payer: Self-pay | Admitting: Cardiovascular Disease

## 2020-09-28 ENCOUNTER — Ambulatory Visit (INDEPENDENT_AMBULATORY_CARE_PROVIDER_SITE_OTHER): Payer: Medicare Other | Admitting: Podiatry

## 2020-09-28 ENCOUNTER — Other Ambulatory Visit: Payer: Self-pay

## 2020-09-28 ENCOUNTER — Encounter: Payer: Self-pay | Admitting: Podiatry

## 2020-09-28 DIAGNOSIS — L84 Corns and callosities: Secondary | ICD-10-CM | POA: Diagnosis not present

## 2020-09-28 DIAGNOSIS — B351 Tinea unguium: Secondary | ICD-10-CM | POA: Diagnosis not present

## 2020-09-28 DIAGNOSIS — D689 Coagulation defect, unspecified: Secondary | ICD-10-CM

## 2020-09-28 DIAGNOSIS — D6851 Activated protein C resistance: Secondary | ICD-10-CM

## 2020-09-28 DIAGNOSIS — M79676 Pain in unspecified toe(s): Secondary | ICD-10-CM

## 2020-09-29 ENCOUNTER — Ambulatory Visit (INDEPENDENT_AMBULATORY_CARE_PROVIDER_SITE_OTHER): Payer: Medicare Other | Admitting: *Deleted

## 2020-09-29 ENCOUNTER — Telehealth: Payer: Self-pay | Admitting: Podiatry

## 2020-09-29 DIAGNOSIS — M810 Age-related osteoporosis without current pathological fracture: Secondary | ICD-10-CM

## 2020-09-29 DIAGNOSIS — M159 Polyosteoarthritis, unspecified: Secondary | ICD-10-CM

## 2020-09-29 DIAGNOSIS — R600 Localized edema: Secondary | ICD-10-CM

## 2020-09-29 DIAGNOSIS — I5189 Other ill-defined heart diseases: Secondary | ICD-10-CM

## 2020-09-29 DIAGNOSIS — M8949 Other hypertrophic osteoarthropathy, multiple sites: Secondary | ICD-10-CM | POA: Diagnosis not present

## 2020-09-29 NOTE — Patient Instructions (Signed)
Visit Information  PATIENT GOALS:  Goals Addressed             This Visit's Progress    (RNCM) Manage Chronic Pain-right 2nd toe   On track    Timeframe:  Long-Range Goal Priority:  Medium Start Date:  08/27/2020                           Expected End Date: 01/20/21                      Follow Up Date 11/10/20    Develop a personal pain management plan Plan exercise or activity when pain is best controlled Prioritize tasks for the day Track times pain is worst and when it is best Track what makes the pain worse and what makes it better Use ice or heat for pain relief Work slower and less intense when having pain  Discuss timing of surgery with provider   Why is this important?   Day-to-day life can be hard when you have chronic pain.  Pain medicine is just one piece of the treatment puzzle.  You can try these action steps to help you manage your pain.    Notes:      (RNCM) Track and Manage Fluids and Swelling-Heart Failure   On track    Timeframe:  Long-Range Goal Priority:  Medium Start Date:    08/27/2020                         Expected End Date: 02/19/2021                      Follow Up Date 11/10/2020    Call office if I gain more than 2 pounds in one day or 5 pounds in one week Keep legs up while sitting Use salt in moderation Watch for swelling in feet, ankles and legs every day Weigh myself daily and record in log, taking log for provider review   Why is this important?   It is important to check your weight daily and watch how much salt and liquids you have.  It will help you to manage your heart failure.    Notes:         Patient verbalizes understanding of instructions provided today and agrees to view in [place].   The care management team will reach out to the patient again over the next 45 business days.   [name] RN, MSN RN Care Management Coordinator  [place] [phone] [email or web address]

## 2020-09-29 NOTE — Chronic Care Management (AMB) (Signed)
Chronic Care Management   CCM RN Visit Note  09/29/2020 Name: Robin Harrell MRN: [id number] DOB: 07/02/1933  Subjective: Robin Harrell is a 87 y.o. year old female who is a primary care patient of [name], MD. The care management team was consulted for assistance with disease management and care coordination needs.    Engaged with patient by telephone for follow up visit in response to provider referral for case management and/or care coordination services.   Consent to Services:  The patient was given information about Chronic Care Management services, agreed to services, and gave verbal consent prior to initiation of services.  Please see initial visit note for detailed documentation.   Patient agreed to services and verbal consent obtained.   Assessment: Review of patient past medical history, allergies, medications, health status, including review of consultants reports, laboratory and other test data, was performed as part of comprehensive evaluation and provision of chronic care management services.   SDOH (Social Determinants of Health) assessments and interventions performed:    CCM Care Plan  Allergies  Allergen Reactions   Azithromycin Nausea Only   Codeine Nausea Only   Erythromycin Nausea And Vomiting    Outpatient Encounter Medications as of 09/29/2020  Medication Sig   acetaminophen (TYLENOL) 500 MG tablet Take 1,000 mg by mouth every 6 (six) hours as needed for moderate pain.   atorvastatin (LIPITOR) 10 MG tablet Take 1 tablet (10 mg total) by mouth daily at 6 PM.   brimonidine (ALPHAGAN) 0.15 % ophthalmic solution Place 2 drops into both eyes 2 (two) times daily.    brimonidine (ALPHAGAN) 0.2 % ophthalmic solution SMARTSIG:In Eye(s)   Calcium-Phosphorus-Vitamin D (CALCIUM/D3 ADULT GUMMIES) 200-96.6-200 MG-MG-UNIT CHEW Chew 2 tablets by mouth daily.   chlorpheniramine (CHLOR-TRIMETON) 4 MG tablet Take 4 mg by mouth every morning.    fluticasone  (FLONASE) 50 MCG/ACT nasal spray SPRAY 2 SPRAYS INTO EACH NOSTRIL EVERY DAY   ipratropium (ATROVENT) 0.03 % nasal spray Place 2 sprays into both nostrils 3 (three) times daily as needed for rhinitis.   latanoprost (XALATAN) 0.005 % ophthalmic solution Place 1 drop into both eyes at bedtime.    levothyroxine (SYNTHROID) 25 MCG tablet TAKE 1 TABLET BY MOUTH DAILY BEFORE BREAKFAST.   loratadine (CLARITIN) 10 MG tablet Take 1 tablet (10 mg total) by mouth daily.   losartan (COZAAR) 50 MG tablet Take 1 tablet (50 mg total) by mouth daily.   metoprolol tartrate (LOPRESSOR) 25 MG tablet Take 1 tablet (25 mg total) by mouth 2 (two) times daily.   multivitamin-iron-minerals-folic acid (CENTRUM) chewable tablet Chew 1 tablet by mouth daily.   mupirocin ointment (BACTROBAN) 2 % APPLY TO RIGHT 2ND TOE ONCE DAILY. (Patient not taking: Reported on 08/27/2020)   vitamin E 1000 UNIT capsule Take 1,000 Units by mouth daily.   warfarin (COUMADIN) 7.5 MG tablet TAKE 1/2 TO 1 TABLET BY MOUTH DAILY AS DIRECTED   No facility-administered encounter medications on file as of 09/29/2020.    Patient Active Problem List   Diagnosis Date Noted   Primary osteoarthritis of both knees 11/18/2019   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 01/29/2019   Chronic venous stasis dermatitis of both lower extremities 12/31/2018   Aortic stenosis, mild 02/07/2017   History of recurrent UTIs 01/26/2017   Chronic idiopathic thrombocytopenia ([place]) 04/14/2016   Essential tremor 09/03/2015   History of respiratory failure 11/11/2014   Hypothyroidism (acquired) 09/02/2014   Edema of right lower extremity 02/01/2013   Hyperlipidemia 11/19/2012  Colon polyp 10/15/2012   Long term (current) use of anticoagulants 05/10/2012   Insomnia 10/11/2011   Osteoarthritis, multiple sites [id number]   Diastolic dysfunction 123XX123   Factor V deficiency ([place]) 02/09/2010   Osteoporosis 02/09/2010   DJD (degenerative joint disease), lumbar 10/14/2009    Glaucoma 01/29/2008   Essential hypertension 11/15/2007   History of DVT (deep vein thrombosis) 11/15/2007   Seasonal and perennial allergic rhinitis 11/15/2007   GERD 11/15/2007   COUGH, CHRONIC 11/15/2007    Conditions to be addressed/monitored:CHF and Pain  Care Plan : Heart Failure (Adult)  Updates made by Leona Singleton, RN since 09/29/2020 12:00 AM     Problem: Symptom Exacerbation (Heart Failure)   Priority: Medium     Long-Range Goal: Symptom Exacerbation Prevented or Minimized   Start Date: 08/27/2020  Expected End Date: 02/19/2021  This Visit's Progress: On track  Priority: Medium  Note:   Current Barriers:  Knowledge deficit related to basic heart failure pathophysiology and self care management patient with history of diastolic dysfunction and chronic lower extremity edema.  States she does weigh herself daily.  Weight this morning was 114 pounds.  Denies any shortness of breath or swelling Lacks reliable transportation; patient and spouse are no longer able to drive and are dependent upon friends for transportation to medical appointments; no family in town; Care guide referral placed for transportation resources Case Manager Clinical Goal(s):  patient will weigh self daily and record patient will verbalize understanding of Heart Failure Action Plan and when to call doctor patient will weigh daily and record (notifying MD of 3 lb weight gain over night or 5 lb in a week) Interventions:  Collaboration with [name], MD regarding development and update of comprehensive plan of care as evidenced by provider attestation and co-signature Inter-disciplinary care team collaboration (see longitudinal plan of care) Basic overview and discussion of pathophysiology of Heart Failure reviewed  Provided verbal education on low sodium diet Provided written education on low sodium diet Reviewed Heart Failure Action Plan in depth and provided written copy Advised patient to  weigh each morning after emptying bladder Discussed importance of daily weight and advised patient to weigh and record daily; discussed when to call provider based on weight. Barriers to lifestyle changes reviewed and addressed and barriers to treatment reviewed and addressed Depression screen reviewed Healthy lifestyle promoted Medication-adherence assessment completed Self-awareness of signs/symptoms of worsening disease encouraged Encouraged to attend all scheduled provider appointments Encouraged to call provider office for new concerns or questions Sent Living Better with Heart Failure Education Packet and encouraged patient to review Patient Goals/Self-Care Activities: Call office if I gain more than 2 pounds in one day or 5 pounds in one week Keep legs up while sitting Use salt in moderation Watch for swelling in feet, ankles and legs every day Weigh myself daily and record in log, taking log for provider review Follow Up Plan: The care management team will reach out to the patient again over the next 45 business days.     Care Plan : Chronic Pain (Adult)  Updates made by Leona Singleton, RN since 09/29/2020 12:00 AM     Problem: Chronic Pain Management (Chronic Pain)   Priority: Medium     Long-Range Goal: Chronic Pain Managed   Start Date: 08/27/2020  Expected End Date: 01/20/2021  Priority: Medium  Note:   Current Barriers:  Knowledge Deficits related to self-health management of chronic pain of right 2nd toe; patient reporting she knows she  needs surgery and plans to schedule soon.  Denies any open wounds, smell or drainage from toe.  Reports pain is controlled with occasional need to take Tylenol Chronic Disease Management support and education needs related to chronic pain Lacks caregiver support. Has hired caregiver that assist patient twice a week.  All children live out of town. Transportation barriers; no longer drives; Care Guide referral placed for transportation  options Clinical Goal(s):  patient will verbalize understanding of plan for pain management. , patient will demonstrate use of different relaxation  skills and/or diversional activities to assist with pain reduction (distraction, imagery, relaxation, massage, acupressure, TENS, heat, and cold application., patient will report pain at a level less than 3 to 4 on a 10-10 rating scale., patient will use pharmacological and nonpharmacological pain relief strategies as prescribed. , patient will verbalize acceptable level of pain relief and ability to engage in desired activities, and patient will engage in desired activities without an increase in pain level Interventions:  Collaboration with [name], MD regarding development and update of comprehensive plan of care as evidenced by provider attestation and co-signature Pain assessment performed Medications reviewed Discussed plans with patient for ongoing care management follow up and provided patient with direct contact information for care management team Evaluation of current treatment plan related to toe pain and patient's adherence to plan as established by provider. Care Guide referral for transportation option resources Careful application of heat or ice encouraged Deep breathing, relaxation and mindfulness use promoted Motivation and barriers to change assessed and addressed Pain assessed and pain treatment goals reviewed Premedication prior to activity encouraged Assessed pain using self-report (most reliable), family/caregiver report, validated pain scale; considered impact on quality of life Encouraged patient to discuss surgery options with provider and timing of surgery Emotional support and empathy provided to patient Confirmed patient has used [employer] without difficulties and provided patient number again Patient Goals/Self Care Activities:  Develop a personal pain management plan Plan exercise or activity when  pain is best controlled Prioritize tasks for the day Track times pain is worst and when it is best Track what makes the pain worse and what makes it better Use ice or heat for pain relief Work slower and less intense when having pain  Discuss timing of surgery with provider Follow Up Plan: The care management team will reach out to the patient again over the next 45 business days.       Plan:The care management team will reach out to the patient again over the next 45 business days.  [name] RN, MSN RN Care Management Coordinator [place] [phone] [email or web address]

## 2020-09-29 NOTE — Telephone Encounter (Signed)
Patient called and stated that she was suppose to be getting sx soon and wanted to know what her next steps are for her right foot 2nd toe. She stated that she would need something sent to her pcp to be taken of of Coumadin.  Please Advise.

## 2020-09-30 NOTE — Progress Notes (Signed)
  Subjective:  Patient ID: Robin Harrell, female    DOB: [birth year],  MRN: [id number]  [name] presents to clinic today for chronic lesion distal tip of right 2nd toe and painful thick toenails that are difficult to trim. Painful toenails interfere with ambulation. Aggravating factors include wearing enclosed shoe gear. Pain is relieved with periodic professional debridement. Painful corns are aggravated when weightbearing when wearing enclosed shoe gear. Pain is relieved with periodic professional debridement.  Patient states her right second toe was painful today.  She is seeing Dr. Sherryle Harrell for this.  They have discussed surgical options due to the fact that this is a chronic issue.  She develops an ulceration on the distal tip of his digit which waxes and wanes.  She has tried numerous padding modalities as well as shoe gear change a1nd condition has not improved.  PCP is Robin Arnt, MD , and last visit was 05/22/2020.  Allergies  Allergen Reactions   Azithromycin Nausea Only   Codeine Nausea Only   Erythromycin Nausea And Vomiting    Review of Systems: Negative except as noted in the HPI. Objective:   Constitutional Robin Harrell is a pleasant 87 y.o. Caucasian female, WD, WN in NAD. AAO x 3.   Vascular Capillary fill time to digits <3 seconds b/l lower extremities. Palpable DP pulse(s) b/l lower extremities Palpable PT pulse(s) b/l lower extremities Pedal hair absent. Lower extremity skin temperature gradient within normal limits. Varicosities present b/l. No cyanosis or clubbing noted.  Neurologic Normal speech. Oriented to person, place, and time. Protective sensation intact 5/5 intact bilaterally with 10g monofilament b/l. Vibratory sensation intact b/l.  Dermatologic Pedal skin with normal turgor, texture and tone b/l lower extremities. No interdigital macerations b/l lower extremities. Toenails 1-5 b/l elongated, discolored, dystrophic, thickened, crumbly with  subungual debris and tenderness to dorsal palpation. Preulcerative lesion noted b/l lower extremities. There is visible subdermal hemorrhage. There is no surrounding erythema, no edema, no drainage, no odor, no fluctuance.  Lesion is mildly macerated with slight odor.  Orthopedic: Normal muscle strength 5/5 to all lower extremity muscle groups bilaterally. Severe hammertoe deformity noted b/l lower extremities.   Radiographs: None Assessment:   1. Pain due to onychomycosis of toenail   2. Pre-ulcerative corn or callous   3. Factor V Leiden ([place])   4. Coagulation defect ([place])    Plan:  -Examined patient. -Patient to continue soft, supportive shoe gear daily. -Toenails 1-5 b/l were debrided in length and girth with sterile nail nippers and dremel without iatrogenic bleeding.  -Preulcerative lesion pared R 2nd toe.  She noted relief posttreatment today. Digit is not infected, but I believe odor is coming from maceration.  Digit was cleansed with alcohol and Betadine paint was applied to address the maceration.  She is to continue following Dr. Noah Harrell orders regarding this time.  I have asked her to follow-up with Dr. Sherryle Harrell. -Patient to report any pedal injuries to medical professional immediately. -Patient/POA to call should there be question/concern in the interim.  Return in about 9 weeks (around 11/30/2020).  Robin Harrell, DPM

## 2020-10-05 ENCOUNTER — Other Ambulatory Visit: Payer: Self-pay

## 2020-10-05 ENCOUNTER — Ambulatory Visit (INDEPENDENT_AMBULATORY_CARE_PROVIDER_SITE_OTHER): Payer: Medicare Other | Admitting: Podiatry

## 2020-10-05 DIAGNOSIS — Z9189 Other specified personal risk factors, not elsewhere classified: Secondary | ICD-10-CM

## 2020-10-05 DIAGNOSIS — M2041 Other hammer toe(s) (acquired), right foot: Secondary | ICD-10-CM | POA: Diagnosis not present

## 2020-10-05 NOTE — Progress Notes (Signed)
  Subjective:  Patient ID: Robin Harrell, female    DOB: [birth year],  MRN: [id number]  Chief Complaint  Patient presents with   Foot Ulcer      right foot 2nd toe-sx consult    87 y.o. female returns for follow-up with the above complaint. History confirmed with patient.  She states she thinks she is ready for surgery  Objective:  Physical Exam: warm, good capillary refill,  normal DP and PT pulses, and normal sensory exam.  Preulcerative callus at the distal tip of the second toe with semirigid hammertoe deformity  I reviewed her previous right foot x-rays which show a contracted rigid hammertoe deformity with degenerative changes  Assessment:   1. Hammer toe of right foot   2. At high risk for deep venous thrombosis       Plan:  Patient was evaluated and treated and all questions answered.  We again discussed that with her hammertoe the semirigid nature of the deformity at this point likely is not to correct with soft tissue work.  The only thing I can offer her is an arthroplasty which I discussed the risk, benefits and potential complications of.  I do think this would offer good correction of the deformity and alleviate the callus and pain.  We discussed all risk benefits and potential complications including but not limited to pain, swelling, infection, scar, numbness which may be temporary or permanent, chronic pain, stiffness, nerve pain or damage, wound healing problems, as well as DVT and/or PE due to her factor V Leiden history and previous history.  I will discuss with her cardiologist Dr. Gwenlyn Found who prescribes her warfarin regarding the need for bridging before and after surgery.  Informed consent was signed and reviewed.  All questions were addressed.   Surgical plan:  Procedure: -Right second toe arthroplasty, possible capsulotomy MTPJ  Location: -[facility]  Anesthesia plan: -IV sedation with local  Postoperative pain plan: - Tylenol 1000 mg every 6 hours,  oxycodone 5 mg 1/2 tablet every 6 hours only as needed  DVT prophylaxis: -We will discuss with Dr. Lazarus Gowda Restrictions / DME needs: -WBAT in surgical shoe which we dispensed today    No follow-ups on file.

## 2020-10-08 ENCOUNTER — Telehealth: Payer: Self-pay | Admitting: Pharmacist Clinician (PhC)/ Clinical Pharmacy Specialist

## 2020-10-08 NOTE — Telephone Encounter (Signed)
   [facility] Pre-operative Risk Assessment    Patient Name: Robin Harrell  DOB: [birth year] MRN: [id number]  HEARTCARE STAFF:  - IMPORTANT!!!!!! Under Visit Info/Reason for Call, type in Other and utilize the format Clearance MM/DD/YY or Clearance TBD. Do not use dashes or single digits. - Please review there is not already an duplicate clearance open for this procedure. - If request is for dental extraction, please clarify the # of teeth to be extracted. - If the patient is currently at the dentist's office, call Pre-Op Callback Staff (MA/nurse) to input urgent request.  - If the patient is not currently in the dentist office, please route to the Pre-Op pool.  Request for surgical clearance:  What type of surgery is being performed? Arthroplasty- right foot  When is this surgery scheduled? TBD  What type of clearance is required (medical clearance vs. Pharmacy clearance to hold med vs. Both)? pharmacy  Are there any medications that need to be held prior to surgery and how long? warfarin  Practice name and name of physician performing surgery? [facility], Dr. Joan Mayans  What is the office phone number? [phone]   7.   What is the office fax number?   8.   Anesthesia type (None, local, MAC, general) ? unknown   Robin Harrell 10/08/2020, 1:59 PM  _________________________________________________________________   (provider comments below)

## 2020-10-08 NOTE — Telephone Encounter (Signed)
Patient with diagnosis of DVT and FVL on warfarin for anticoagulation.   Procedure: arthroplasty- right foot Date of procedure: TBD   CrCl 33 (using adjusted body weight) Platelet count 127  Per office protocol, patient can hold warfarin for 5 days prior to procedure.   Patient will need bridging with Lovenox (enoxaparin) around procedure.  Please let us know when date has been set for procedure.  Also, request was to Dr. Gwenlyn Found directly to hold warfarin, however patient has not been seen in office since Feb 2021 except for INR checks.  May need cardiac clearance as well.

## 2020-10-09 NOTE — Telephone Encounter (Signed)
Primary Cardiologist:Jonathan Gwenlyn Found, MD  Chart reviewed as part of pre-operative protocol coverage. Because of Robin Harrell's past medical history and time since last visit, he/she will require a follow-up visit in order to better assess preoperative cardiovascular risk.  Pre-op covering staff: - Please schedule appointment and call patient to inform them. - Please contact requesting surgeon's office via preferred method (i.e, phone, fax) to inform them of need for appointment prior to surgery.  If applicable, this message will also be routed to pharmacy pool and/or primary cardiologist for input on holding anticoagulant/antiplatelet agent as requested below so that this information is available at time of patient's appointment.   Deberah Pelton, NP  10/09/2020, 8:15 AM

## 2020-10-09 NOTE — Telephone Encounter (Signed)
Appointment scheduled 10/14/2020 Time: 3:15 PM  Forwarded to requesting party via [place] fax function

## 2020-10-14 ENCOUNTER — Other Ambulatory Visit: Payer: Self-pay

## 2020-10-14 ENCOUNTER — Ambulatory Visit (INDEPENDENT_AMBULATORY_CARE_PROVIDER_SITE_OTHER): Payer: Self-pay | Admitting: Pharmacist

## 2020-10-14 ENCOUNTER — Ambulatory Visit (INDEPENDENT_AMBULATORY_CARE_PROVIDER_SITE_OTHER): Payer: Medicare Other | Admitting: Cardiovascular Disease

## 2020-10-14 ENCOUNTER — Encounter: Payer: Self-pay | Admitting: Cardiovascular Disease

## 2020-10-14 ENCOUNTER — Telehealth: Payer: Self-pay | Admitting: Pharmacist

## 2020-10-14 VITALS — BP 101/58 | HR 92 | Ht 64.0 in | Wt 113.6 lb

## 2020-10-14 DIAGNOSIS — E782 Mixed hyperlipidemia: Secondary | ICD-10-CM | POA: Diagnosis not present

## 2020-10-14 DIAGNOSIS — I350 Nonrheumatic aortic (valve) stenosis: Secondary | ICD-10-CM | POA: Diagnosis not present

## 2020-10-14 DIAGNOSIS — Z86718 Personal history of other venous thrombosis and embolism: Secondary | ICD-10-CM

## 2020-10-14 DIAGNOSIS — D682 Hereditary deficiency of other clotting factors: Secondary | ICD-10-CM

## 2020-10-14 DIAGNOSIS — I10 Essential (primary) hypertension: Secondary | ICD-10-CM

## 2020-10-14 DIAGNOSIS — Z7901 Long term (current) use of anticoagulants: Secondary | ICD-10-CM

## 2020-10-14 LAB — POCT INR: INR: 3.2 — AB (ref 2.0–3.0)

## 2020-10-14 MED ORDER — ENOXAPARIN SODIUM 80 MG/0.8ML IJ SOSY: PREFILLED_SYRINGE | INTRAMUSCULAR | 0 refills | Status: DC

## 2020-10-14 NOTE — Patient Instructions (Addendum)
Description   Hold dose today, then continue taking 1 tablet on Thursday and 1/2 tablet on Friday.  Then hold warfarin until the evening of your procedure.  Then resume your normal schedule until your INR check on 10/29/20.  Repeat INR on 10/29/20  Follow the directions on your handout.  Please call with any questions.  [phone]

## 2020-10-14 NOTE — Assessment & Plan Note (Signed)
History of essential hypertension a blood pressure measured at 101/58.  She is on losartan and metoprolol.

## 2020-10-14 NOTE — Patient Instructions (Signed)
Medication Instructions:  The current medical regimen is effective;  continue present plan and medications.  *If you need a refill on your cardiac medications before your next appointment, please call your pharmacy*  Testing/Procedures: Echocardiogram - Your physician has requested that you have an echocardiogram. Echocardiography is a painless test that uses sound waves to create images of your heart. It provides your doctor with information about the size and shape of your heart and how well your heart's chambers and valves are working. This procedure takes approximately one hour. There are no restrictions for this procedure. This will be performed at our [address] location - [address], [address].    Follow-Up: At [facility], you and your health needs are our priority.  As part of our continuing mission to provide you with exceptional heart care, we have created designated Provider Care Teams.  These Care Teams include your primary Cardiologist (physician) and Advanced Practice Providers (APPs -  Physician Assistants and Nurse Practitioners) who all work together to provide you with the care you need, when you need it.  We recommend signing up for the patient portal called "MyChart".  Sign up information is provided on this After Visit Summary.  MyChart is used to connect with patients for Virtual Visits (Telemedicine).  Patients are able to view lab/test results, encounter notes, upcoming appointments, etc.  Non-urgent messages can be sent to your provider as well.   To learn more about what you can do with MyChart, go to [email or web address].    Your next appointment:   12 month(s)  The format for your next appointment:   In Person  Provider:   Jonathan Berry, MD     

## 2020-10-14 NOTE — Progress Notes (Signed)
10/14/2020 Robin Harrell [facility]   1933/11/15  [id number]  Primary Physician [name], MD Primary Cardiologist: Lorretta Harp MD FACP, [place], [place], [place]  HPI:  Robin Harrell is a 87 y.o.  thin-appearing, married Caucasian female, mother of 48, grandmother to 4 grandchildren who I last saw in the office on   04/16/2019.  She is accompanied by her caregiver Noella today.  She has a history of treated hypertension, hyperlipidemia, and GERD. She does have a history of pulmonary embolism in the past with factor V Leiden deficiency on life-long Coumadin anticoagulation. She has had a normal 2D echo and Myoview back in 2009. She was recently admitted to [facility] for 2 weeks with acute respiratory insufficiency requiring intubation related to pneumonia and RSV. There was a thought that she may have had congestive heart failure as well, though a 2D echo was normal. She did have a Holter monitor that showed a large number of bigeminal PVCs which she is asymptomatic from and currently is on low-dose beta blocker.    She was hospitalized at [facility] 02/25/2019 for approximately a week with viral pneumonitis secondary to COVID-19 pneumonia.  Her husband was COVID-19 positive as well but he was relatively asymptomatic.  She has recovered from this.  Since I saw her a year and a half ago she continues to do well.  She walks with a cane at home.  She does get mildly dyspneic when exerting herself but this is not lifestyle limiting.  She did have a 2D echocardiogram performed 05/07/2020 revealing normal LV systolic function with moderate aortic stenosis and moderate mitral stenosis.    Current Meds  Medication Sig   acetaminophen (TYLENOL) 500 MG tablet Take 1,000 mg by mouth every 6 (six) hours as needed for moderate pain.   atorvastatin (LIPITOR) 10 MG tablet Take 1 tablet (10 mg total) by mouth daily at 6 PM.   brimonidine (ALPHAGAN) 0.15 % ophthalmic solution Place 2 drops  into both eyes 2 (two) times daily.    brimonidine (ALPHAGAN) 0.2 % ophthalmic solution SMARTSIG:In Eye(s)   Calcium-Phosphorus-Vitamin D (CALCIUM/D3 ADULT GUMMIES) 200-96.6-200 MG-MG-UNIT CHEW Chew 2 tablets by mouth daily.   chlorpheniramine (CHLOR-TRIMETON) 4 MG tablet Take 4 mg by mouth every morning.    fluticasone (FLONASE) 50 MCG/ACT nasal spray SPRAY 2 SPRAYS INTO EACH NOSTRIL EVERY DAY   ipratropium (ATROVENT) 0.03 % nasal spray Place 2 sprays into both nostrils 3 (three) times daily as needed for rhinitis.   latanoprost (XALATAN) 0.005 % ophthalmic solution Place 1 drop into both eyes at bedtime.    levothyroxine (SYNTHROID) 25 MCG tablet TAKE 1 TABLET BY MOUTH DAILY BEFORE BREAKFAST.   loratadine (CLARITIN) 10 MG tablet Take 1 tablet (10 mg total) by mouth daily.   losartan (COZAAR) 50 MG tablet Take 1 tablet (50 mg total) by mouth daily.   metoprolol tartrate (LOPRESSOR) 25 MG tablet Take 1 tablet (25 mg total) by mouth 2 (two) times daily.   multivitamin-iron-minerals-folic acid (CENTRUM) chewable tablet Chew 1 tablet by mouth daily.   mupirocin ointment (BACTROBAN) 2 % APPLY TO RIGHT 2ND TOE ONCE DAILY.   vitamin E 1000 UNIT capsule Take 1,000 Units by mouth daily.   warfarin (COUMADIN) 7.5 MG tablet TAKE 1/2 TO 1 TABLET BY MOUTH DAILY AS DIRECTED     Allergies  Allergen Reactions   Azithromycin Nausea Only   Codeine Nausea Only   Erythromycin Nausea And Vomiting    Social  History   Socioeconomic History   Marital status: Married    Spouse name: Not on file   Number of children: 4   Years of education: 13.5   Highest education level: Not on file  Occupational History   Occupation: Network engineer - retired  Tobacco Use   Smoking status: Former    Years: 1.00    Types: Cigarettes    Quit date: 02/21/1958    Years since quitting: 62.6   Smokeless tobacco: Never  Vaping Use   Vaping Use: Never used  Substance and Sexual Activity   Alcohol use: Yes    Comment: rareley  one glass of wine   Drug use: No   Sexual activity: Never  Other Topics Concern   Not on file  Social History Narrative   Not on file   Social Determinants of Health   Financial Resource Strain: Low Risk    Difficulty of Paying Living Expenses: Not hard at all  Food Insecurity: No Food Insecurity   Worried About Charity fundraiser in the Last Year: Never true   [place] in the Last Year: Never true  Transportation Needs: Unmet Transportation Needs   Lack of Transportation (Medical): Yes   Lack of Transportation (Non-Medical): No  Physical Activity: Inactive   Days of Exercise per Week: 0 days   Minutes of Exercise per Session: 0 min  Stress: No Stress Concern Present   Feeling of Stress : Not at all  Social Connections: Socially Integrated   Frequency of Communication with Friends and Family: Twice a week   Frequency of Social Gatherings with Friends and Family: Three times a week   Attends Religious Services: More than 4 times per year   Active Member of Clubs or Organizations: Yes   Attends Archivist Meetings: 1 to 4 times per year   Marital Status: Married  Human resources officer Violence: Not At Risk   Fear of Current or Ex-Partner: No   Emotionally Abused: No   Physically Abused: No   Sexually Abused: No     Review of Systems: General: negative for chills, fever, night sweats or weight changes.  Cardiovascular: negative for chest pain, dyspnea on exertion, edema, orthopnea, palpitations, paroxysmal nocturnal dyspnea or shortness of breath Dermatological: negative for rash Respiratory: negative for cough or wheezing Urologic: negative for hematuria Abdominal: negative for nausea, vomiting, diarrhea, bright red blood per rectum, melena, or hematemesis Neurologic: negative for visual changes, syncope, or dizziness All other systems reviewed and are otherwise negative except as noted above.    Blood pressure (!) 101/58, pulse 92, height '5\' 4"'$  (1.626 m),  weight 113 lb 9.6 oz (51.5 kg), SpO2 98 %.  General appearance: alert Neck: no adenopathy, no carotid bruit, no JVD, supple, symmetrical, trachea midline, and thyroid not enlarged, symmetric, no tenderness/mass/nodules Lungs: clear to auscultation bilaterally Heart: 2/6 outflow tract murmur consistent with aortic stenosis Extremities: extremities normal, atraumatic, no cyanosis or edema Pulses: 2+ and symmetric Skin: Skin color, texture, turgor normal. No rashes or lesions Neurologic: Grossly normal  EKG sinus rhythm at 92 with septal Q waves.  I personally reviewed this EKG.  ASSESSMENT AND PLAN:   Essential hypertension History of essential hypertension a blood pressure measured at 101/58.  She is on losartan and metoprolol.  Hyperlipidemia History of hyperlipidemia on statin therapy with lipid profile performed 11/18/2019 revealing total cholesterol 158, LDL 65 and HDL of 79.  Factor V deficiency ([place]) History of factor V Leiden deficiency with  remote pulmonary embolism on lifelong Coumadin anticoagulation followed in our Coumadin clinic.  She is scheduled to have a podiatry surgical procedure on 10/23/2020 by Dr. Sherryle Lis.  I am going to arrange for her to have Lovenox bridging.     Lorretta Harp MD FACP,FACC,FAHA, [facility] 10/14/2020 4:09 PM

## 2020-10-14 NOTE — Telephone Encounter (Signed)
8/27: Last dose of warfarin.  8/28: No warfarin or enoxaparin (Lovenox).  8/29: Inject enoxaparin 80 mg in the fatty abdominal tissue at least 2 inches from the belly button in the morning. No warfarin.  8/30: Inject enoxaparin 80 mg in the fatty tissue every 24 hours. No warfarin.  8/31: Inject enoxaparin in the fatty tissue every 24 hours. No warfarin.  9/1: Inject enoxaparin in the fatty tissue in the morning at 8 am. No warfarin.  9/2: Procedure Day - No enoxaparin - Resume warfarin in the evening or as directed by doctor (take an extra half tablet with usual dose for 2 days then resume normal dose).  9/3: Resume enoxaparin '80mg'$  inject in the fatty tissue every 24 hours and take warfarin  9/4: Inject enoxaparin '80mg'$  in the fatty tissue every 24 hours and take warfarin  9/5: Inject enoxaparin '80mg'$  in the fatty tissue every 24 hours and take warfarin  9/6: Inject enoxaparin '80mg'$  in the fatty tissue every 24 hours and take warfarin  9/7: Inject enoxaparin '80mg'$  in the fatty tissue every 24 hours and take warfarin  9/8: warfarin appt to check INR.

## 2020-10-14 NOTE — Assessment & Plan Note (Signed)
History of hyperlipidemia on statin therapy with lipid profile performed 11/18/2019 revealing total cholesterol 158, LDL 65 and HDL of 79.

## 2020-10-14 NOTE — Assessment & Plan Note (Signed)
History of factor V Leiden deficiency with remote pulmonary embolism on lifelong Coumadin anticoagulation followed in our Coumadin clinic.  She is scheduled to have a podiatry surgical procedure on 10/23/2020 by Dr. Sherryle Lis.  I am going to arrange for her to have Lovenox bridging.

## 2020-10-14 NOTE — Addendum Note (Signed)
Addended by: Rollen Sox on: 10/14/2020 05:42 PM   Modules accepted: Orders

## 2020-10-18 ENCOUNTER — Other Ambulatory Visit: Payer: Self-pay | Admitting: Emergency Medicine

## 2020-10-23 ENCOUNTER — Encounter: Payer: Self-pay | Admitting: Podiatry

## 2020-10-23 ENCOUNTER — Other Ambulatory Visit: Payer: Self-pay | Admitting: Podiatry

## 2020-10-23 DIAGNOSIS — M2042 Other hammer toe(s) (acquired), left foot: Secondary | ICD-10-CM | POA: Diagnosis not present

## 2020-10-23 DIAGNOSIS — M2041 Other hammer toe(s) (acquired), right foot: Secondary | ICD-10-CM | POA: Diagnosis not present

## 2020-10-23 MED ORDER — OXYCODONE HCL 5 MG PO TABS: 2.5000 mg | ORAL_TABLET | Freq: Three times a day (TID) | ORAL | 0 refills | Status: AC | PRN

## 2020-10-23 NOTE — Progress Notes (Signed)
R 2nd hammertoe correction [facility] 10/23/20

## 2020-10-24 DIAGNOSIS — D693 Immune thrombocytopenic purpura: Secondary | ICD-10-CM | POA: Diagnosis not present

## 2020-10-24 DIAGNOSIS — M4010 Other secondary kyphosis, site unspecified: Secondary | ICD-10-CM | POA: Diagnosis not present

## 2020-10-24 DIAGNOSIS — Z9189 Other specified personal risk factors, not elsewhere classified: Secondary | ICD-10-CM | POA: Diagnosis not present

## 2020-10-24 DIAGNOSIS — Z7901 Long term (current) use of anticoagulants: Secondary | ICD-10-CM | POA: Diagnosis not present

## 2020-10-24 DIAGNOSIS — M170 Bilateral primary osteoarthritis of knee: Secondary | ICD-10-CM | POA: Diagnosis not present

## 2020-10-24 DIAGNOSIS — R262 Difficulty in walking, not elsewhere classified: Secondary | ICD-10-CM | POA: Diagnosis not present

## 2020-10-24 DIAGNOSIS — Z4789 Encounter for other orthopedic aftercare: Secondary | ICD-10-CM | POA: Diagnosis not present

## 2020-10-24 DIAGNOSIS — M810 Age-related osteoporosis without current pathological fracture: Secondary | ICD-10-CM | POA: Diagnosis not present

## 2020-10-28 DIAGNOSIS — M810 Age-related osteoporosis without current pathological fracture: Secondary | ICD-10-CM | POA: Diagnosis not present

## 2020-10-28 DIAGNOSIS — Z4789 Encounter for other orthopedic aftercare: Secondary | ICD-10-CM | POA: Diagnosis not present

## 2020-10-28 DIAGNOSIS — Z9189 Other specified personal risk factors, not elsewhere classified: Secondary | ICD-10-CM | POA: Diagnosis not present

## 2020-10-28 DIAGNOSIS — M4010 Other secondary kyphosis, site unspecified: Secondary | ICD-10-CM | POA: Diagnosis not present

## 2020-10-28 DIAGNOSIS — M170 Bilateral primary osteoarthritis of knee: Secondary | ICD-10-CM | POA: Diagnosis not present

## 2020-10-28 DIAGNOSIS — R262 Difficulty in walking, not elsewhere classified: Secondary | ICD-10-CM | POA: Diagnosis not present

## 2020-10-29 ENCOUNTER — Other Ambulatory Visit: Payer: Self-pay

## 2020-10-29 ENCOUNTER — Ambulatory Visit (INDEPENDENT_AMBULATORY_CARE_PROVIDER_SITE_OTHER): Payer: Medicare Other

## 2020-10-29 ENCOUNTER — Ambulatory Visit (INDEPENDENT_AMBULATORY_CARE_PROVIDER_SITE_OTHER): Payer: Medicare Other | Admitting: Podiatry

## 2020-10-29 ENCOUNTER — Ambulatory Visit: Payer: Medicare Other

## 2020-10-29 DIAGNOSIS — Z7901 Long term (current) use of anticoagulants: Secondary | ICD-10-CM | POA: Diagnosis not present

## 2020-10-29 DIAGNOSIS — Z5181 Encounter for therapeutic drug level monitoring: Secondary | ICD-10-CM | POA: Diagnosis not present

## 2020-10-29 DIAGNOSIS — M2041 Other hammer toe(s) (acquired), right foot: Secondary | ICD-10-CM

## 2020-10-29 DIAGNOSIS — Z9889 Other specified postprocedural states: Secondary | ICD-10-CM | POA: Diagnosis not present

## 2020-10-29 LAB — POCT INR: INR: 2.1 (ref 2.0–3.0)

## 2020-10-29 NOTE — Patient Instructions (Signed)
-   STOP LOVENOX - continue warfarin dosage of 1 tablet every EXCEPT 1/2 TABLET ON MONDAYS, [place]. - Recheck INR in 4 weeks  [phone]

## 2020-10-30 NOTE — Progress Notes (Signed)
  Subjective:  Patient ID: Robin Harrell, female    DOB: 07/08/1933,  MRN: [id number]  Chief Complaint  Patient presents with   Routine Post Op    (xrays)POV #1 DOS 10/23/2020 HAMMERTOE CORRECTION RT 2ND TOE      87 y.o. female returns for post-op check.  Doing well not having much pain at all  Review of Systems: Negative except as noted in the HPI. Denies N/V/F/Ch.   Objective:  There were no vitals filed for this visit. There is no height or weight on file to calculate BMI. Constitutional Well developed. Well nourished.  Vascular Foot warm and well perfused. Capillary refill normal to all digits.   Neurologic Normal speech. Oriented to person, place, and time. Epicritic sensation to light touch grossly present bilaterally.  Dermatologic Skin healing well without signs of infection. Skin edges well coapted without signs of infection.  Orthopedic: Minimal tenderness to palpation noted about the surgical site.   Multiple view plain film radiographs: Status post right second toe arthroplasty Assessment:   1. Post-operative state   2. Hammer toe of right foot    Plan:  Patient was evaluated and treated and all questions answered.  S/p foot surgery right -Progressing as expected post-operatively. -XR: As above -WB Status: WBAT in supportive shoes -Sutures: Removed at next visit. -Medications: No refills required -Foot redressed.  May begin bathing  Return in about 2 weeks (around 11/12/2020) for post op (no x-rays), suture removal.

## 2020-11-03 DIAGNOSIS — R262 Difficulty in walking, not elsewhere classified: Secondary | ICD-10-CM | POA: Diagnosis not present

## 2020-11-03 DIAGNOSIS — Z9189 Other specified personal risk factors, not elsewhere classified: Secondary | ICD-10-CM | POA: Diagnosis not present

## 2020-11-03 DIAGNOSIS — M810 Age-related osteoporosis without current pathological fracture: Secondary | ICD-10-CM | POA: Diagnosis not present

## 2020-11-03 DIAGNOSIS — M4010 Other secondary kyphosis, site unspecified: Secondary | ICD-10-CM | POA: Diagnosis not present

## 2020-11-03 DIAGNOSIS — M170 Bilateral primary osteoarthritis of knee: Secondary | ICD-10-CM | POA: Diagnosis not present

## 2020-11-03 DIAGNOSIS — Z4789 Encounter for other orthopedic aftercare: Secondary | ICD-10-CM | POA: Diagnosis not present

## 2020-11-03 DIAGNOSIS — Z20822 Contact with and (suspected) exposure to covid-19: Secondary | ICD-10-CM | POA: Diagnosis not present

## 2020-11-04 ENCOUNTER — Encounter: Payer: Self-pay | Admitting: Family Medicine

## 2020-11-04 ENCOUNTER — Ambulatory Visit (INDEPENDENT_AMBULATORY_CARE_PROVIDER_SITE_OTHER): Payer: Medicare Other | Admitting: Family Medicine

## 2020-11-04 ENCOUNTER — Other Ambulatory Visit: Payer: Self-pay

## 2020-11-04 VITALS — BP 130/80 | HR 72 | Temp 98.0°F | Ht 64.0 in | Wt 113.6 lb

## 2020-11-04 DIAGNOSIS — R600 Localized edema: Secondary | ICD-10-CM

## 2020-11-04 DIAGNOSIS — E538 Deficiency of other specified B group vitamins: Secondary | ICD-10-CM

## 2020-11-04 DIAGNOSIS — I10 Essential (primary) hypertension: Secondary | ICD-10-CM | POA: Diagnosis not present

## 2020-11-04 DIAGNOSIS — Z23 Encounter for immunization: Secondary | ICD-10-CM | POA: Diagnosis not present

## 2020-11-04 DIAGNOSIS — E039 Hypothyroidism, unspecified: Secondary | ICD-10-CM

## 2020-11-04 DIAGNOSIS — D682 Hereditary deficiency of other clotting factors: Secondary | ICD-10-CM | POA: Diagnosis not present

## 2020-11-04 DIAGNOSIS — E559 Vitamin D deficiency, unspecified: Secondary | ICD-10-CM | POA: Diagnosis not present

## 2020-11-04 DIAGNOSIS — M810 Age-related osteoporosis without current pathological fracture: Secondary | ICD-10-CM | POA: Diagnosis not present

## 2020-11-04 DIAGNOSIS — M533 Sacrococcygeal disorders, not elsewhere classified: Secondary | ICD-10-CM | POA: Diagnosis not present

## 2020-11-04 DIAGNOSIS — D693 Immune thrombocytopenic purpura: Secondary | ICD-10-CM | POA: Diagnosis not present

## 2020-11-04 DIAGNOSIS — E782 Mixed hyperlipidemia: Secondary | ICD-10-CM

## 2020-11-04 DIAGNOSIS — Z7901 Long term (current) use of anticoagulants: Secondary | ICD-10-CM | POA: Diagnosis not present

## 2020-11-04 MED ORDER — DENOSUMAB 60 MG/ML ~~LOC~~ SOSY
60.0000 mg | PREFILLED_SYRINGE | Freq: Once | SUBCUTANEOUS | Status: AC
Start: 2020-11-04 — End: 2020-11-04
  Administered 2020-11-04: 60 mg via SUBCUTANEOUS

## 2020-11-04 MED ORDER — LEVOCETIRIZINE DIHYDROCHLORIDE 5 MG PO TABS: 5.0000 mg | ORAL_TABLET | Freq: Every evening | ORAL | 11 refills | Status: DC

## 2020-11-04 NOTE — Patient Instructions (Signed)
Please return in 6 months for recheck.   Try the xyzal daily for your drainage; if not improving, you can contact Dr. Agustina Caroli office for further suggestions.   If you have any questions or concerns, please don't hesitate to send me a message via MyChart or call the office at [phone]. Thank you for visiting with [place] today! It's our pleasure caring for you.  Tailbone Injury The tailbone (coccyx) is the small bone at the lower end of the spine. A tailbone injury may involve stretched ligaments, bruising, or a broken bone (fracture). Tailbone injuries can be painful, and some may take a long time to heal. What are the causes? This condition may be caused by: Falling and landing on the tailbone. Repeated strain or friction from sitting for long periods of time. This may include actions such as rowing and bicycling. Childbirth. In some cases, the cause may not be known. What are the signs or symptoms? Symptoms of this condition include: Pain in the tailbone area or lower back, especially when sitting. Pain or difficulty when standing up from a sitting position. Bruising or swelling in the tailbone area. Painful bowel movements. In women, pain during intercourse. How is this diagnosed? This condition may be diagnosed based on: Your symptoms. A physical exam. If your health care provider suspects a fracture, you may have additional tests, such as: X-rays. CT scan. MRI. How is this treated? Most tailbone injuries heal on their own in 4-6 weeks. However, recovery time may be longer if the injury involves a fracture. Treatment for this condition may include: NSAIDs or other over-the-counter medicines to help relieve your pain. Using a large, rubber or inflated ring or cushion to take pressure off the tailbone when sitting. Physical therapy. Injecting the tailbone area with local anesthesia and steroid medicine. This is not normally needed unless the pain does not improve over time with  over-the-counter pain medicines. Follow these instructions at home: Activity Avoid sitting for long periods of time. To prevent repeating an injury that is caused by strain or friction: Wear appropriate padding and sports gear when bicycling and rowing. Increase your activity as the pain allows. Perform any exercises that are recommended by your health care provider or physical therapist. Managing pain, stiffness, and swelling To help decrease discomfort when sitting: Sit on your rubber or inflated ring or cushion as told by your health care provider. Lean forward when you sit. If directed, apply ice to the injured area: Put ice in a plastic bag. Place a towel between your skin and the bag. Leave the ice on for 20 minutes, 2-3 times per day for the first 1-2 days. If directed, apply heat to the affected area as often as told by your health care provider. Use the heat source that your health care provider recommends, such as a moist heat pack or a heating pad. Place a towel between your skin and the heat source. Leave the heat on for 20-30 minutes. Remove the heat if your skin turns bright red. This is especially important if you are unable to feel pain, heat, or cold. You may have a greater risk of getting burned. General instructions Take over-the-counter and prescription medicines only as told by your health care provider. To prevent or treat constipation or painful bowel movements, your health care provider may recommend that you: Drink enough fluid to keep your urine pale yellow. Eat foods that are high in fiber, such as fresh fruits and vegetables, whole grains, and beans. Limit foods  that are high in fat and processed sugars, such as fried and sweet foods. Take an over-the-counter or prescription medicine for constipation. Keep all follow-up visits as directed by your health care provider. This is important. Contact a health care provider if: Your pain becomes worse or is not  controlled with medicine. Your bowel movements cause a great deal of discomfort. You are unable to have a bowel movement after 4 days. You have pain during intercourse. Summary A tailbone injury may involve stretched ligaments, bruising, or a broken bone (fracture). Tailbone injuries can be painful. Most heal on their own in 4-6 weeks. Treatment may include taking NSAIDs, using a rubber or inflated ring or cushion when sitting, and physical therapy. Follow any recommendations from your health care provider to prevent or treat constipation. This information is not intended to replace advice given to you by your health care provider. Make sure you discuss any questions you have with your health care provider. Document Revised: 03/07/2017 Document Reviewed: 03/07/2017 Elsevier Patient Education  2022 [employer].

## 2020-11-04 NOTE — Progress Notes (Signed)
Subjective  Chief Complaint  Patient presents with   Annual Exam    Fasting   Hypertension   Hypothyroidism   Gastroesophageal Reflux    HPI: Robin Harrell is a 87 y.o. female who presents to [place] [facility] today for a Female Wellness Visit. She also has the concerns and/or needs as listed above in the chief complaint. These will be addressed in addition to the Health Maintenance Visit.   Wellness Visit: annual visit with health maintenance review and exam without Pap  HM: flu shot today. No longer with mammo or CRC screens. Lives with elderly husband who has PD. This is difficult as he does not speak much anymore. Has a caregiver in the home 3x/ week. No longer driving. Has good support system with church and friends. Children live elsewhere. No recent falls. Unsteady gait and osteoporosis with kyphosis.  Chronic disease f/u and/or acute problem visit: (deemed necessary to be done in addition to the wellness visit): Osteoporosis: due for prolia. Reviewed last dexa. This is 3rd year of treatment. Tolerates well Chronic anticoag for factor v leiden and h/o DVT. On coumadin.  S/p toe surgery.  HTN and HLD have been well controlled. No cp or sob. Chronic leg edema persists on prn lasix.  MR/AS: w/o syncope Mild low thyroid tolerating low dose supplements and due for recheck.  Allergies and chronic cough: more pnd; now on loratidine, flonase, atroventNS.  Coccyx pain: still with intermittent pain; was worse when had to sit more after recent surgery. No radicular sxs. Started after traumatic fall. Tylenol helpful   Assessment  1. Essential hypertension   2. Factor V deficiency ([place])   3. Long term (current) use of anticoagulants   4. Mixed hyperlipidemia   5. Hypothyroidism (acquired)   6. Age-related osteoporosis without current pathological fracture   7. Chronic idiopathic thrombocytopenia (HCC)   8. Edema of right lower extremity   9. Vitamin B12  deficiency   10. Vitamin D deficiency   11. Traumatic coccydynia      Plan  Female Wellness Visit: Age appropriate Health Maintenance and Prevention measures were discussed with patient. Included topics are cancer screening recommendations, ways to keep healthy (see AVS) including dietary and exercise recommendations, regular eye and dental care, use of seat belts, and avoidance of moderate alcohol use and tobacco use.  BMI: discussed patient's BMI and encouraged positive lifestyle modifications to help get to or maintain a target BMI. HM needs and immunizations were addressed and ordered. See below for orders. See HM and immunization section for updates. Flu vaccine today Routine labs and screening tests ordered including cmp, cbc and lipids where appropriate. Discussed recommendations regarding Vit D and calcium supplementation (see AVS)  Chronic disease management visit and/or acute problem visit: Osteoporosis: prolia and check vit D and calcium levels. On oral supplements.  HTN and HLD: controlled. No change in meds. See list below. Recheck labs.  AS/MR:remains asymptomatic and monitored by cards Chronic anticoagulation w/o bleeds/complications. Check vitamin levels and cbc. Tylenol and positional mgt for coccyx pain.  Allergies and chronic cough: trial of xyzal. Can contact Dr. Lamonte Sakai if not improving.  Counseling for depression prevention given elderly and husbands illness. History of iron deficiency: will recheck and can stop iron if stores are normal. I spent a total of 43 minutes for this patient encounter. Time spent included preparation, face-to-face counseling with the patient and coordination of care, review of chart and records, and documentation of the encounter.  Follow up: 6 mo for recheck.   Orders Placed This Encounter  Procedures   CBC with Differential/Platelet   Comprehensive metabolic panel   Lipid panel   TSH   VITAMIN D 25 Hydroxy (Vit-D Deficiency, Fractures)    Vitamin B12   Meds ordered this encounter  Medications   levocetirizine (XYZAL) 5 MG tablet    Sig: Take 1 tablet (5 mg total) by mouth every evening.    Dispense:  30 tablet    Refill:  11      Body mass index is 19.5 kg/m. Wt Readings from Last 3 Encounters:  11/04/20 113 lb 9.6 oz (51.5 kg)  10/14/20 113 lb 9.6 oz (51.5 kg)  06/08/20 124 lb 3.2 oz (56.3 kg)     Patient Active Problem List   Diagnosis Date Noted   Hypothyroidism (acquired) 09/02/2014    Priority: High    Overview:  Started low dose replacemtn 05/2014    Hyperlipidemia 11/19/2012    Priority: High    hyperlipidemia    Long term (current) use of anticoagulants 05/10/2012    Priority: High    Coumadin anticoagulation    Diastolic dysfunction [id number]    Priority: High    Overview:  On beta-blocker, followed by Dr. Ancil Linsey; no h/o CHF    Factor V deficiency ([place]) 02/09/2010    Priority: High    Overview:  Factor V Deficiency, lifelong coumadin, followed by coumadin clinic, Dr. Gwenlyn Found    Osteoporosis 02/09/2010    Priority: High    Dexa 05/2020: T - -4.3 wrist; -2.1, 1.9 femurs; -1.9 at L/spine, on prolia. continue Dexa 08/2017: T = - 3.9 wrist; no significant decrease frome 2017, on Prolia x 2 years.  Dexa 07/21/2015: T = - 3.4 left hip ( 33% decrease from -2.4 in 2015);  [facility]: T = - 3.4 wrist, T = - 2.4 at hip; prolia, failed fosamax    Essential hypertension 11/15/2007    Priority: High    Qualifier: Diagnosis of  By: Ronnald Ramp CNA/MA, Jessica      Chronic venous stasis dermatitis of both lower extremities 12/31/2018    Priority: Medium   Aortic stenosis, mild 02/07/2017    Priority: Medium    Echo 01/2017: nl wall motion, EF 60%, mild AS (tricuspid), mild MR, moderate diastolic dysfunction    Chronic idiopathic thrombocytopenia ([place]) 04/14/2016    Priority: Medium   Essential tremor 09/03/2015    Priority: Medium   Edema of right lower extremity 02/01/2013     Priority: Medium    Overview:  Due to prior right TKR: Wears 15-65mhg thigh high compression stockings.    Colon polyp 10/15/2012    Priority: Medium    Overview:  Dr. MWatt Climes q 10 year colonoscopy    Insomnia 10/11/2011    Priority: Medium   Osteoarthritis, multiple sites 06/30/2011    Priority: Medium    Overview:  Hand and knees.  S/p right knee replacement, Dr. AHillery Aldo   DJD (degenerative joint disease), lumbar 10/14/2009    Priority: Medium    Overview:  PT; declines surgery. Has been evaluated by Dr. 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 Nl Echo.    Seasonal and perennial allergic rhinitis 11/15/2007    Priority: Low    Qualifier: Diagnosis of  By: Ronnald Ramp CNA/MA, Jessica      Traumatic coccydynia 11/04/2020   Health Maintenance  Topic Date Due   COVID-19 Vaccine (3 - Booster) 11/17/2019   INFLUENZA VACCINE  09/21/2020   DEXA SCAN  06/09/2022   PNA vac Low Risk Adult  Completed   Zoster Vaccines- Shingrix  Completed   HPV VACCINES  Aged Out   TETANUS/TDAP  Discontinued   Immunization History  Administered Date(s) Administered   Fluad Quad(high Dose 65+) 10/17/2018, 11/18/2019   Influenza Split 11/21/2008, 02/09/2010, 10/24/2011, 11/21/2012   Influenza Whole 11/27/2008   Influenza, High Dose Seasonal PF 11/12/2011, 11/29/2012, 11/05/2013, 01/05/2015, 11/16/2015, 10/21/2016,  11/09/2017   Influenza,inj,Quad PF,6+ Mos 11/23/2015   Influenza-Unspecified 02/02/2011   PFIZER(Purple Top)SARS-COV-2 Vaccination 06/03/2019, 06/17/2019   Pneumococcal Conjugate-13 04/23/2013   Pneumococcal Polysaccharide-23 02/21/2005   Tdap 09/22/2007   Zoster Recombinat (Shingrix) 07/11/2016, 01/27/2017   Zoster, Live 04/22/2010   We updated and reviewed the patient's past history in detail and it is documented below. Allergies: Patient is allergic to azithromycin, codeine, and erythromycin. Past Medical History Patient  has a past medical history of Allergic rhinitis, Cough, Difficulty in swallowing, DVT (deep venous thrombosis) ([place]), Dyslipidemia, Factor V Leiden deficiency, GERD (gastroesophageal reflux disease), History of nuclear stress test (12/28/2007), HTN (hypertension), PND (post-nasal drip), Pneumonia due to COVID-19 virus (02/26/2019), Pulmonary embolism ([place]), PVC's (premature ventricular contractions), and Thyroid disease. Past Surgical History Patient  has a past surgical history that includes Cholecystectomy (1973); history of sleep study (12/27/2007); Bunionectomy; Hammer toe surgery; Replacement total knee (01/11/2002); transthoracic echocardiogram (12/28/2007); Appendectomy; Esophageal dilation; and Cataract extraction w/ intraocular lens implant (Right, 11/02/2013). Family History: Patient family history includes Allergic rhinitis in her sister; Breast cancer (age of onset: 39) in her daughter; Cirrhosis in her father; Coronary artery disease in her maternal grandfather and paternal grandfather; Heart disease in her paternal grandmother; Lung cancer in her father; Skin cancer in her brother; Throat cancer in her maternal grandfather and paternal grandfather. Social History:  Patient  reports that she quit smoking about 62 years ago. Her smoking use included cigarettes. She has never used smokeless tobacco. She reports current alcohol use. She reports that she does not use  drugs.  Review of Systems: Constitutional: negative for fever or malaise Ophthalmic: negative for photophobia, double vision or loss of vision Cardiovascular: negative for chest pain, dyspnea on exertion, or new LE swelling Respiratory: negative for SOB or persistent cough Gastrointestinal: negative for abdominal pain, change in bowel habits or melena Genitourinary: negative for dysuria or gross hematuria, no abnormal uterine bleeding or disharge Musculoskeletal: negative for new gait disturbance or muscular weakness Integumentary: negative for new or persistent rashes, no breast lumps Neurological: negative for TIA or stroke symptoms Psychiatric: negative for SI or delusions Allergic/Immunologic: negative for hives  Patient Care Team    Relationship Specialty Notifications Start End  [name], MD PCP - General Family Medicine  07/05/12   Lorretta Harp, MD PCP -  Cardiology Cardiology  10/09/20   Collene Gobble, MD Consulting Physician Pulmonary Disease  11/09/17   Lorretta Harp, MD Consulting Physician Cardiology  11/09/17   Harriett Sine, MD Consulting Physician Dermatology  01/30/18   Katy Apo, MD Consulting Physician Ophthalmology  11/23/18   Marzetta Board, DPM Consulting Physician Podiatry  11/23/18   Leona Singleton, RN Case Manager   08/19/20    Comment: [facility] RN Care Manager  [phone]    Objective  Vitals: BP 130/80   Pulse 72   Temp 98 F (36.7 C)   Ht '5\' 4"'$  (1.626 m)   Wt 113 lb 9.6 oz (51.5 kg)   LMP  (LMP Unknown)   SpO2 100%   BMI 19.50 kg/m  General:  kyphotic, well nourished, no acute distress , uses pronged cane for ambulation Psych:  Alert and orientedx3,normal mood and affect HEENT:  Normocephalic, atraumatic, non-icteric sclera,  supple neck without adenopathy, mass or thyromegaly Cardiovascular:  Normal S1, S2, RRR with 2/6 systolic murmurs present Respiratory:  Good breath sounds bilaterally, CTAB with normal  respiratory effort Gastrointestinal: normal bowel sounds, soft, non-tender, no noted masses. No HSM Chronic LE skin changes present.  Ext :L>R edema.  Neurologic:    Mental status is normal.    Commons side effects, risks, benefits, and alternatives for medications and treatment plan prescribed today were discussed, and the patient expressed understanding of the given instructions. Patient is instructed to call or message via MyChart if he/she has any questions or concerns regarding our treatment plan. No barriers to understanding were identified. We discussed Red Flag symptoms and signs in detail. Patient expressed understanding regarding what to do in case of urgent or emergency type symptoms.  Medication list was reconciled, printed and provided to the patient in AVS. Patient instructions and summary information was reviewed with the patient as documented in the AVS. This note was prepared with assistance of Dragon voice recognition software. Occasional wrong-word or sound-a-like substitutions may have occurred due to the inherent limitations of voice recognition software  This visit occurred during the SARS-CoV-2 public health emergency.  Safety protocols were in place, including screening questions prior to the visit, additional usage of staff PPE, and extensive cleaning of exam room while observing appropriate contact time as indicated for disinfecting solutions.

## 2020-11-05 DIAGNOSIS — R262 Difficulty in walking, not elsewhere classified: Secondary | ICD-10-CM | POA: Diagnosis not present

## 2020-11-05 DIAGNOSIS — Z9189 Other specified personal risk factors, not elsewhere classified: Secondary | ICD-10-CM | POA: Diagnosis not present

## 2020-11-05 DIAGNOSIS — M170 Bilateral primary osteoarthritis of knee: Secondary | ICD-10-CM | POA: Diagnosis not present

## 2020-11-05 DIAGNOSIS — M810 Age-related osteoporosis without current pathological fracture: Secondary | ICD-10-CM | POA: Diagnosis not present

## 2020-11-05 DIAGNOSIS — M4010 Other secondary kyphosis, site unspecified: Secondary | ICD-10-CM | POA: Diagnosis not present

## 2020-11-05 DIAGNOSIS — Z4789 Encounter for other orthopedic aftercare: Secondary | ICD-10-CM | POA: Diagnosis not present

## 2020-11-09 ENCOUNTER — Other Ambulatory Visit: Payer: Self-pay

## 2020-11-09 ENCOUNTER — Other Ambulatory Visit (INDEPENDENT_AMBULATORY_CARE_PROVIDER_SITE_OTHER): Payer: Medicare Other

## 2020-11-09 DIAGNOSIS — Z9189 Other specified personal risk factors, not elsewhere classified: Secondary | ICD-10-CM | POA: Diagnosis not present

## 2020-11-09 DIAGNOSIS — I10 Essential (primary) hypertension: Secondary | ICD-10-CM | POA: Diagnosis not present

## 2020-11-09 DIAGNOSIS — M810 Age-related osteoporosis without current pathological fracture: Secondary | ICD-10-CM | POA: Diagnosis not present

## 2020-11-09 DIAGNOSIS — E782 Mixed hyperlipidemia: Secondary | ICD-10-CM | POA: Diagnosis not present

## 2020-11-09 DIAGNOSIS — D693 Immune thrombocytopenic purpura: Secondary | ICD-10-CM

## 2020-11-09 DIAGNOSIS — E039 Hypothyroidism, unspecified: Secondary | ICD-10-CM

## 2020-11-09 DIAGNOSIS — R262 Difficulty in walking, not elsewhere classified: Secondary | ICD-10-CM | POA: Diagnosis not present

## 2020-11-09 DIAGNOSIS — E538 Deficiency of other specified B group vitamins: Secondary | ICD-10-CM

## 2020-11-09 DIAGNOSIS — E559 Vitamin D deficiency, unspecified: Secondary | ICD-10-CM | POA: Diagnosis not present

## 2020-11-09 DIAGNOSIS — M4010 Other secondary kyphosis, site unspecified: Secondary | ICD-10-CM | POA: Diagnosis not present

## 2020-11-09 DIAGNOSIS — M170 Bilateral primary osteoarthritis of knee: Secondary | ICD-10-CM | POA: Diagnosis not present

## 2020-11-09 DIAGNOSIS — Z4789 Encounter for other orthopedic aftercare: Secondary | ICD-10-CM | POA: Diagnosis not present

## 2020-11-09 DIAGNOSIS — D682 Hereditary deficiency of other clotting factors: Secondary | ICD-10-CM | POA: Diagnosis not present

## 2020-11-09 LAB — CBC WITH DIFFERENTIAL/PLATELET
Basophils Absolute: 0 10*3/uL (ref 0.0–0.1)
Basophils Absolute: 0 10*3/uL (ref 0.0–0.1)
Basophils Relative: 0.8 % (ref 0.0–3.0)
Basophils Relative: 0.8 % (ref 0.0–3.0)
Eosinophils Absolute: 0.2 10*3/uL (ref 0.0–0.7)
Eosinophils Absolute: 0.2 10*3/uL (ref 0.0–0.7)
Eosinophils Relative: 4.9 % (ref 0.0–5.0)
Eosinophils Relative: 4.1 % (ref 0.0–5.0)
HCT: 35.8 % — ABNORMAL LOW (ref 36.0–46.0)
HCT: 36.4 % (ref 36.0–46.0)
Hemoglobin: 11.9 g/dL — ABNORMAL LOW (ref 12.0–15.0)
Hemoglobin: 12.2 g/dL (ref 12.0–15.0)
Lymphocytes Relative: 21.3 % (ref 12.0–46.0)
Lymphocytes Relative: 20.5 % (ref 12.0–46.0)
Lymphs Abs: 1 10*3/uL (ref 0.7–4.0)
Lymphs Abs: 1 10*3/uL (ref 0.7–4.0)
MCHC: 33.3 g/dL (ref 30.0–36.0)
MCHC: 33.5 g/dL (ref 30.0–36.0)
MCV: 95.3 fl (ref 78.0–100.0)
MCV: 95.2 fl (ref 78.0–100.0)
Monocytes Absolute: 0.5 10*3/uL (ref 0.1–1.0)
Monocytes Absolute: 0.5 10*3/uL (ref 0.1–1.0)
Monocytes Relative: 10.3 % (ref 3.0–12.0)
Monocytes Relative: 10.2 % (ref 3.0–12.0)
Neutro Abs: 2.9 10*3/uL (ref 1.4–7.7)
Neutro Abs: 3.1 10*3/uL (ref 1.4–7.7)
Neutrophils Relative %: 62.7 % (ref 43.0–77.0)
Neutrophils Relative %: 64.4 % (ref 43.0–77.0)
Platelets: 136 10*3/uL — ABNORMAL LOW (ref 150.0–400.0)
Platelets: 136 10*3/uL — ABNORMAL LOW (ref 150.0–400.0)
RBC: 3.76 Mil/uL — ABNORMAL LOW (ref 3.87–5.11)
RBC: 3.82 Mil/uL — ABNORMAL LOW (ref 3.87–5.11)
RDW: 13.4 % (ref 11.5–15.5)
RDW: 13.3 % (ref 11.5–15.5)
WBC: 4.7 10*3/uL (ref 4.0–10.5)
WBC: 4.8 10*3/uL (ref 4.0–10.5)

## 2020-11-09 LAB — COMPREHENSIVE METABOLIC PANEL
ALT: 14 U/L (ref 0–35)
ALT: 15 U/L (ref 0–35)
AST: 21 U/L (ref 0–37)
AST: 20 U/L (ref 0–37)
Albumin: 4 g/dL (ref 3.5–5.2)
Albumin: 4 g/dL (ref 3.5–5.2)
Alkaline Phosphatase: 61 U/L (ref 39–117)
Alkaline Phosphatase: 62 U/L (ref 39–117)
BUN: 12 mg/dL (ref 6–23)
BUN: 12 mg/dL (ref 6–23)
CO2: 29 mEq/L (ref 19–32)
CO2: 28 mEq/L (ref 19–32)
Calcium: 9 mg/dL (ref 8.4–10.5)
Calcium: 9.1 mg/dL (ref 8.4–10.5)
Chloride: 104 mEq/L (ref 96–112)
Chloride: 104 mEq/L (ref 96–112)
Creatinine, Ser: 0.87 mg/dL (ref 0.40–1.20)
Creatinine, Ser: 0.89 mg/dL (ref 0.40–1.20)
GFR: 61.01 mL/min (ref >60.00)
GFR: 59.37 mL/min — ABNORMAL LOW (ref >60.00)
Glucose, Bld: 88 mg/dL (ref 70–99)
Glucose, Bld: 89 mg/dL (ref 70–99)
Potassium: 4.5 mEq/L (ref 3.5–5.1)
Potassium: 4.6 mEq/L (ref 3.5–5.1)
Sodium: 141 mEq/L (ref 135–145)
Sodium: 141 mEq/L (ref 135–145)
Total Bilirubin: 0.7 mg/dL (ref 0.2–1.2)
Total Bilirubin: 0.7 mg/dL (ref 0.2–1.2)
Total Protein: 6.7 g/dL (ref 6.0–8.3)
Total Protein: 6.6 g/dL (ref 6.0–8.3)

## 2020-11-09 LAB — LIPID PANEL
Cholesterol: 150 mg/dL (ref 0–200)
Cholesterol: 150 mg/dL (ref 0–200)
HDL: 74.9 mg/dL (ref >39.00)
HDL: 74.4 mg/dL (ref >39.00)
LDL Cholesterol: 64 mg/dL (ref 0–99)
LDL Cholesterol: 64 mg/dL (ref 0–99)
NonHDL: 75.57
NonHDL: 75.33
Total CHOL/HDL Ratio: 2
Total CHOL/HDL Ratio: 2
Triglycerides: 59 mg/dL (ref 0.0–149.0)
Triglycerides: 59 mg/dL (ref 0.0–149.0)
VLDL: 11.8 mg/dL (ref 0.0–40.0)
VLDL: 11.8 mg/dL (ref 0.0–40.0)

## 2020-11-09 LAB — TSH
TSH: 3.16 u[IU]/mL (ref 0.35–5.50)
TSH: 3.12 u[IU]/mL (ref 0.35–5.50)

## 2020-11-09 LAB — VITAMIN D 25 HYDROXY (VIT D DEFICIENCY, FRACTURES)
VITD: 54.59 ng/mL (ref 30.00–100.00)
VITD: 50.53 ng/mL (ref 30.00–100.00)

## 2020-11-09 LAB — VITAMIN B12
Vitamin B-12: 652 pg/mL (ref 211–911)
Vitamin B-12: 650 pg/mL (ref 211–911)

## 2020-11-10 ENCOUNTER — Ambulatory Visit: Payer: Medicare Other | Admitting: *Deleted

## 2020-11-10 DIAGNOSIS — I5189 Other ill-defined heart diseases: Secondary | ICD-10-CM

## 2020-11-10 DIAGNOSIS — M170 Bilateral primary osteoarthritis of knee: Secondary | ICD-10-CM

## 2020-11-10 LAB — IRON,TIBC AND FERRITIN PANEL
%SAT: 20 % (calc) (ref 16–45)
Ferritin: 184 ng/mL (ref 16–288)
Iron: 61 ug/dL (ref 45–160)
TIBC: 309 mcg/dL (calc) (ref 250–450)

## 2020-11-10 NOTE — Chronic Care Management (AMB) (Signed)
  Care Management   Follow Up Note   11/10/2020 Name: Robin Harrell MRN: [id number] DOB: [birth year]   Referred by: [name], MD Reason for referral : Chronic Care Management (CHF, Pain)   Successful telephone outreach to patient for follow up telephone assessment.  Patient answered and stated she was not able to talk right now.  Requested call back another day and time.   Follow Up Plan: The care management team will reach out to the patient again over the next 30 business days per patient request.   [name] RN, MSN RN Care Management Coordinator  [place] [phone] [email or web address]

## 2020-11-11 DIAGNOSIS — R262 Difficulty in walking, not elsewhere classified: Secondary | ICD-10-CM | POA: Diagnosis not present

## 2020-11-11 DIAGNOSIS — Z9189 Other specified personal risk factors, not elsewhere classified: Secondary | ICD-10-CM | POA: Diagnosis not present

## 2020-11-11 DIAGNOSIS — M4010 Other secondary kyphosis, site unspecified: Secondary | ICD-10-CM | POA: Diagnosis not present

## 2020-11-11 DIAGNOSIS — M170 Bilateral primary osteoarthritis of knee: Secondary | ICD-10-CM | POA: Diagnosis not present

## 2020-11-11 DIAGNOSIS — Z4789 Encounter for other orthopedic aftercare: Secondary | ICD-10-CM | POA: Diagnosis not present

## 2020-11-11 DIAGNOSIS — M810 Age-related osteoporosis without current pathological fracture: Secondary | ICD-10-CM | POA: Diagnosis not present

## 2020-11-12 ENCOUNTER — Other Ambulatory Visit: Payer: Self-pay

## 2020-11-12 ENCOUNTER — Ambulatory Visit (INDEPENDENT_AMBULATORY_CARE_PROVIDER_SITE_OTHER): Payer: Medicare Other | Admitting: Podiatry

## 2020-11-12 DIAGNOSIS — M2041 Other hammer toe(s) (acquired), right foot: Secondary | ICD-10-CM

## 2020-11-12 DIAGNOSIS — Z9889 Other specified postprocedural states: Secondary | ICD-10-CM

## 2020-11-16 ENCOUNTER — Telehealth: Payer: Self-pay

## 2020-11-16 DIAGNOSIS — M4010 Other secondary kyphosis, site unspecified: Secondary | ICD-10-CM | POA: Diagnosis not present

## 2020-11-16 DIAGNOSIS — R262 Difficulty in walking, not elsewhere classified: Secondary | ICD-10-CM | POA: Diagnosis not present

## 2020-11-16 DIAGNOSIS — M170 Bilateral primary osteoarthritis of knee: Secondary | ICD-10-CM | POA: Diagnosis not present

## 2020-11-16 DIAGNOSIS — Z9189 Other specified personal risk factors, not elsewhere classified: Secondary | ICD-10-CM | POA: Diagnosis not present

## 2020-11-16 DIAGNOSIS — M810 Age-related osteoporosis without current pathological fracture: Secondary | ICD-10-CM | POA: Diagnosis not present

## 2020-11-16 DIAGNOSIS — Z4789 Encounter for other orthopedic aftercare: Secondary | ICD-10-CM | POA: Diagnosis not present

## 2020-11-16 NOTE — Telephone Encounter (Signed)
Pt called wanting to speak to a nurse. Please Advise.

## 2020-11-16 NOTE — Telephone Encounter (Signed)
Spoke with patient regarding labs, gave a verbal understanding. Patient would like to know if she could be prescribed something else for her cough, medication is not working. Takes current medication before bed and cough still keeps her up at night

## 2020-11-16 NOTE — Telephone Encounter (Signed)
Please advise 

## 2020-11-16 NOTE — Telephone Encounter (Signed)
Patient is calling in wondering if someone is able to give her a call about her lab results.

## 2020-11-17 ENCOUNTER — Ambulatory Visit (INDEPENDENT_AMBULATORY_CARE_PROVIDER_SITE_OTHER): Payer: Medicare Other | Admitting: *Deleted

## 2020-11-17 DIAGNOSIS — I5189 Other ill-defined heart diseases: Secondary | ICD-10-CM

## 2020-11-17 DIAGNOSIS — R059 Cough, unspecified: Secondary | ICD-10-CM

## 2020-11-17 DIAGNOSIS — M810 Age-related osteoporosis without current pathological fracture: Secondary | ICD-10-CM

## 2020-11-17 MED ORDER — BENZONATATE 100 MG PO CAPS: 100.0000 mg | ORAL_CAPSULE | Freq: Two times a day (BID) | ORAL | 1 refills | Status: DC | PRN

## 2020-11-17 NOTE — Chronic Care Management (AMB) (Signed)
Chronic Care Management   CCM RN Visit Note  11/17/2020 Name: Robin Harrell MRN: [id number] DOB: 1933-04-09  Subjective: Robin Harrell is a 87 y.o. year old female who is a primary care patient of [name], MD. The care management team was consulted for assistance with disease management and care coordination needs.    Engaged with patient by telephone for follow up visit in response to provider referral for case management and/or care coordination services.   Consent to Services:  The patient was given information about Chronic Care Management services, agreed to services, and gave verbal consent prior to initiation of services.  Please see initial visit note for detailed documentation.   Patient agreed to services and verbal consent obtained.   Assessment: Review of patient past medical history, allergies, medications, health status, including review of consultants reports, laboratory and other test data, was performed as part of comprehensive evaluation and provision of chronic care management services.   SDOH (Social Determinants of Health) assessments and interventions performed:    CCM Care Plan  Allergies  Allergen Reactions   Azithromycin Nausea Only   Codeine Nausea Only   Erythromycin Nausea And Vomiting    Outpatient Encounter Medications as of 11/17/2020  Medication Sig   acetaminophen (TYLENOL) 500 MG tablet Take 1,000 mg by mouth every 6 (six) hours as needed for moderate pain.   atorvastatin (LIPITOR) 10 MG tablet Take 1 tablet (10 mg total) by mouth daily at 6 PM.   benzonatate (TESSALON) 100 MG capsule Take 1 capsule (100 mg total) by mouth 2 (two) times daily as needed for cough.   brimonidine (ALPHAGAN) 0.15 % ophthalmic solution Place 2 drops into both eyes 2 (two) times daily.    brimonidine (ALPHAGAN) 0.2 % ophthalmic solution SMARTSIG:In Eye(s)   Calcium-Phosphorus-Vitamin D (CALCIUM/D3 ADULT GUMMIES) 200-96.6-200 MG-MG-UNIT CHEW Chew 2  tablets by mouth daily.   fluticasone (FLONASE) 50 MCG/ACT nasal spray SPRAY 2 SPRAYS INTO EACH NOSTRIL EVERY DAY   ipratropium (ATROVENT) 0.03 % nasal spray PLACE 2 SPRAYS INTO BOTH NOSTRILS 3 (THREE) TIMES DAILY AS NEEDED FOR RHINITIS.   latanoprost (XALATAN) 0.005 % ophthalmic solution Place 1 drop into both eyes at bedtime.    levocetirizine (XYZAL) 5 MG tablet Take 1 tablet (5 mg total) by mouth every evening.   levothyroxine (SYNTHROID) 25 MCG tablet TAKE 1 TABLET BY MOUTH DAILY BEFORE BREAKFAST.   losartan (COZAAR) 50 MG tablet Take 1 tablet (50 mg total) by mouth daily.   metoprolol tartrate (LOPRESSOR) 25 MG tablet Take 1 tablet (25 mg total) by mouth 2 (two) times daily.   multivitamin-iron-minerals-folic acid (CENTRUM) chewable tablet Chew 1 tablet by mouth daily.   vitamin E 1000 UNIT capsule Take 1,000 Units by mouth daily.   warfarin (COUMADIN) 7.5 MG tablet TAKE 1/2 TO 1 TABLET BY MOUTH DAILY AS DIRECTED   No facility-administered encounter medications on file as of 11/17/2020.    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 01/29/2019   Chronic venous stasis dermatitis of both lower extremities 12/31/2018   Aortic stenosis, mild 02/07/2017   History of recurrent UTIs 01/26/2017   Chronic idiopathic thrombocytopenia ([place]) 04/14/2016   Essential tremor 09/03/2015   History of respiratory failure 11/11/2014   Hypothyroidism (acquired) 09/02/2014   Edema of right lower extremity 02/01/2013   Hyperlipidemia 11/19/2012   Colon polyp 10/15/2012  Long term (current) use of anticoagulants 05/10/2012   Insomnia 10/11/2011   Osteoarthritis, multiple sites 29/56/2130   Diastolic dysfunction 86/57/8469   Factor V deficiency ([place]) 02/09/2010   Osteoporosis 02/09/2010   DJD (degenerative joint disease), lumbar 10/14/2009   Glaucoma 01/29/2008   Essential  hypertension 11/15/2007   History of DVT (deep vein thrombosis) 11/15/2007   Seasonal and perennial allergic rhinitis 11/15/2007   GERD 11/15/2007   COUGH, CHRONIC 11/15/2007    Conditions to be addressed/monitored:CHF and Pain  Care Plan : Heart Failure (Adult)  Updates made by Leona Singleton, RN since 11/17/2020 12:00 AM     Problem: Symptom Exacerbation (Heart Failure)   Priority: Medium     Long-Range Goal: Symptom Exacerbation Prevented or Minimized   Start Date: 08/27/2020  Expected End Date: 02/19/2021  This Visit's Progress: On track  Recent Progress: On track  Priority: Medium  Note:   Current Barriers:  Knowledge deficit related to basic heart failure pathophysiology and self care management patient with history of diastolic dysfunction and chronic lower extremity edema.  States she does weigh herself daily.  Weight this morning was 115 pounds.  Denies any shortness of breath or swelling; does report chronic cough worse than normal; medications adjusted and patient encouraged to schedule pulmonology appointment Lacks reliable transportation; patient and spouse are no longer able to drive and are dependent upon friends for transportation to medical appointments; no family in town; Care guide referral placed for transportation resources Case Manager Clinical Goal(s):  patient will weigh self daily and record patient will verbalize understanding of Heart Failure Action Plan and when to call doctor patient will weigh daily and record (notifying MD of 3 lb weight gain over night or 5 lb in a week) Interventions:  Collaboration with [name], MD regarding development and update of comprehensive plan of care as evidenced by provider attestation and co-signature Inter-disciplinary care team collaboration (see longitudinal plan of care) Basic overview and discussion of pathophysiology of Heart Failure reviewed  Provided verbal education on low sodium diet Provided written  education on low sodium diet Reviewed Heart Failure Action Plan in depth and provided written copy Advised patient to weigh each morning after emptying bladder Discussed importance of daily weight and advised patient to weigh and record daily; discussed when to call provider based on weight. Barriers to lifestyle changes reviewed and addressed and barriers to treatment reviewed and addressed Depression screen reviewed Healthy lifestyle promoted Medication-adherence assessment completed Self-awareness of signs/symptoms of worsening disease encouraged Encouraged to attend all scheduled provider appointments Encouraged to call provider office for new concerns or questions Sent Living Better with Heart Failure Education Packet and encouraged patient to review Patient Goals/Self-Care Activities: Call office if I gain more than 2 pounds in one day or 5 pounds in one week Keep legs up while sitting Use salt in moderation Watch for swelling in feet, ankles and legs every day Weigh myself daily and record in log, taking log for provider review Schedule appointment with pulmonology Follow Up Plan: The care management team will reach out to the patient again over the next 30 business days.     Care Plan : Chronic Pain (Adult)  Updates made by Leona Singleton, RN since 11/17/2020 12:00 AM     Problem: Chronic Pain Management (Chronic Pain)   Priority: Medium     Long-Range Goal: Chronic Pain Managed   Start Date: 08/27/2020  Expected End Date: 01/20/2021  This Visit's Progress: On track  Priority:  Medium  Note:   Current Barriers:  Knowledge Deficits related to self-health management of chronic pain of right 2nd toe; patient reporting surgery completed about 3 weeks ago; no complications; minimal to no pain Chronic Disease Management support and education needs related to chronic pain Lacks caregiver support. Has hired caregiver that assist patient twice a week.  All children live out of  town. Transportation barriers; no longer drives; Care Guide referral placed for transportation options Clinical Goal(s):  patient will verbalize understanding of plan for pain management. , patient will demonstrate use of different relaxation  skills and/or diversional activities to assist with pain reduction (distraction, imagery, relaxation, massage, acupressure, TENS, heat, and cold application., patient will report pain at a level less than 3 to 4 on a 10-10 rating scale., patient will use pharmacological and nonpharmacological pain relief strategies as prescribed. , patient will verbalize acceptable level of pain relief and ability to engage in desired activities, and patient will engage in desired activities without an increase in pain level Interventions:  Collaboration with [name], MD regarding development and update of comprehensive plan of care as evidenced by provider attestation and co-signature Pain assessment performed Medications reviewed Discussed plans with patient for ongoing care management follow up and provided patient with direct contact information for care management team Evaluation of current treatment plan related to toe pain and patient's adherence to plan as established by provider. Care Guide referral for transportation option resources Careful application of heat or ice encouraged Deep breathing, relaxation and mindfulness use promoted Motivation and barriers to change assessed and addressed Pain assessed and pain treatment goals reviewed Premedication prior to activity encouraged Assessed pain using self-report (most reliable), family/caregiver report, validated pain scale; considered impact on quality of life Encouraged patient to stay in close contact with surgeon and to monitor incisions options with provider and timing of surgery Emotional support and empathy provided to patient Confirmed patient has used [employer] without difficulties and  provided patient number again Patient Goals/Self Care Activities:  Develop a personal pain management plan Plan exercise or activity when pain is best controlled Prioritize tasks for the day Track times pain is worst and when it is best Track what makes the pain worse and what makes it better Use ice or heat for pain relief Work slower and less intense when having pain Follow Up Plan: The care management team will reach out to the patient again over the next 30 business days.       Plan:The care management team will reach out to the patient again over the next 30 business days.  [name] RN, MSN RN Care Management Coordinator  [place] [phone] [email or web address]

## 2020-11-17 NOTE — Telephone Encounter (Signed)
I've ordered tessalon perles to try.  If not improving, it may be time to see Dr. Lamonte Sakai again, the lung doctor who has seen her for her chronic cough in past.

## 2020-11-17 NOTE — Patient Instructions (Signed)
Visit Information  PATIENT GOALS:  Goals Addressed             This Visit's Progress    (RNCM) Manage Chronic Pain-right 2nd toe   On track    Timeframe:  Long-Range Goal Priority:  Medium Start Date:  08/27/2020                           Expected End Date: 01/20/21                      Follow Up Date 12/08/20    Develop a personal pain management plan Plan exercise or activity when pain is best controlled Prioritize tasks for the day Track times pain is worst and when it is best Track what makes the pain worse and what makes it better Use ice or heat for pain relief Work slower and less intense when having pain    Why is this important?   Day-to-day life can be hard when you have chronic pain.  Pain medicine is just one piece of the treatment puzzle.  You can try these action steps to help you manage your pain.    Notes:      (RNCM) Track and Manage Fluids and Swelling-Heart Failure   On track    Timeframe:  Long-Range Goal Priority:  Medium Start Date:    08/27/2020                         Expected End Date: 02/19/2021                      Follow Up Date 12/08/2020    Call office if I gain more than 2 pounds in one day or 5 pounds in one week Keep legs up while sitting Use salt in moderation Watch for swelling in feet, ankles and legs every day Weigh myself daily and record in log, taking log for provider review Schedule appointment with pulmonology   Why is this important?   It is important to check your weight daily and watch how much salt and liquids you have.  It will help you to manage your heart failure.    Notes:         Patient verbalizes understanding of instructions provided today and agrees to view in [place].   The care management team will reach out to the patient again over the next 30 business days.   [name] RN, MSN RN Care Management Coordinator  [facility] [phone] [email or web address]

## 2020-11-17 NOTE — Progress Notes (Signed)
  Subjective:  Patient ID: Robin Harrell, female    DOB: September 26, 1933,  MRN: [id number]  Chief Complaint  Patient presents with   Routine Post Op     POV #2 DOS 10/23/2020 HAMMERTOE CORRECTION RT 2ND TOE      87 y.o. female returns for post-op check.  Doing well not having much pain at all  Review of Systems: Negative except as noted in the HPI. Denies N/V/F/Ch.   Objective:  There were no vitals filed for this visit. There is no height or weight on file to calculate BMI. Constitutional Well developed. Well nourished.  Vascular Foot warm and well perfused. Capillary refill normal to all digits.   Neurologic Normal speech. Oriented to person, place, and time. Epicritic sensation to light touch grossly present bilaterally.  Dermatologic Skin healing well without signs of infection. Skin edges well coapted without signs of infection.  Orthopedic: Minimal tenderness to palpation noted about the surgical site.   Multiple view plain film radiographs: Status post right second toe arthroplasty Assessment:   1. Hammer toe of right foot   2. Post-operative state    Plan:  Patient was evaluated and treated and all questions answered.  S/p foot surgery right -Sutures removed today she can resume regular shoe gear and bathing as tolerated.  Return to see me as needed for this or other issues  Return if symptoms worsen or fail to improve.

## 2020-11-17 NOTE — Addendum Note (Signed)
Addended by: Billey Chang on: 11/17/2020 09:00 AM   Modules accepted: Orders

## 2020-11-17 NOTE — Telephone Encounter (Signed)
Spoke with patient asking if she should continue taking her iron, per Dr.Andy she may stop taking her iron. Patient gave a verbal understanding

## 2020-11-20 DIAGNOSIS — M170 Bilateral primary osteoarthritis of knee: Secondary | ICD-10-CM

## 2020-11-20 DIAGNOSIS — M810 Age-related osteoporosis without current pathological fracture: Secondary | ICD-10-CM | POA: Diagnosis not present

## 2020-11-23 DIAGNOSIS — R262 Difficulty in walking, not elsewhere classified: Secondary | ICD-10-CM | POA: Diagnosis not present

## 2020-11-23 DIAGNOSIS — M4010 Other secondary kyphosis, site unspecified: Secondary | ICD-10-CM | POA: Diagnosis not present

## 2020-11-23 DIAGNOSIS — D693 Immune thrombocytopenic purpura: Secondary | ICD-10-CM | POA: Diagnosis not present

## 2020-11-23 DIAGNOSIS — Z9189 Other specified personal risk factors, not elsewhere classified: Secondary | ICD-10-CM | POA: Diagnosis not present

## 2020-11-23 DIAGNOSIS — Z4789 Encounter for other orthopedic aftercare: Secondary | ICD-10-CM | POA: Diagnosis not present

## 2020-11-23 DIAGNOSIS — M170 Bilateral primary osteoarthritis of knee: Secondary | ICD-10-CM | POA: Diagnosis not present

## 2020-11-23 DIAGNOSIS — M810 Age-related osteoporosis without current pathological fracture: Secondary | ICD-10-CM | POA: Diagnosis not present

## 2020-11-23 DIAGNOSIS — Z7901 Long term (current) use of anticoagulants: Secondary | ICD-10-CM | POA: Diagnosis not present

## 2020-11-26 ENCOUNTER — Other Ambulatory Visit: Payer: Self-pay

## 2020-11-26 ENCOUNTER — Ambulatory Visit (INDEPENDENT_AMBULATORY_CARE_PROVIDER_SITE_OTHER): Payer: Medicare Other

## 2020-11-26 DIAGNOSIS — Z7901 Long term (current) use of anticoagulants: Secondary | ICD-10-CM

## 2020-11-26 LAB — POCT INR: INR: 4.7 — AB (ref 2.0–3.0)

## 2020-11-26 NOTE — Patient Instructions (Signed)
-   HOLD tonight and Friday night - continue warfarin dosage of 1 tablet every EXCEPT 1/2 TABLET ON MONDAYS, [place]. - Recheck INR in 1 week  [phone]

## 2020-11-27 DIAGNOSIS — Z9189 Other specified personal risk factors, not elsewhere classified: Secondary | ICD-10-CM | POA: Diagnosis not present

## 2020-11-27 DIAGNOSIS — M170 Bilateral primary osteoarthritis of knee: Secondary | ICD-10-CM | POA: Diagnosis not present

## 2020-11-27 DIAGNOSIS — Z4789 Encounter for other orthopedic aftercare: Secondary | ICD-10-CM | POA: Diagnosis not present

## 2020-11-27 DIAGNOSIS — R262 Difficulty in walking, not elsewhere classified: Secondary | ICD-10-CM | POA: Diagnosis not present

## 2020-11-27 DIAGNOSIS — M810 Age-related osteoporosis without current pathological fracture: Secondary | ICD-10-CM | POA: Diagnosis not present

## 2020-11-27 DIAGNOSIS — M4010 Other secondary kyphosis, site unspecified: Secondary | ICD-10-CM | POA: Diagnosis not present

## 2020-11-30 DIAGNOSIS — Z9189 Other specified personal risk factors, not elsewhere classified: Secondary | ICD-10-CM | POA: Diagnosis not present

## 2020-11-30 DIAGNOSIS — M170 Bilateral primary osteoarthritis of knee: Secondary | ICD-10-CM | POA: Diagnosis not present

## 2020-11-30 DIAGNOSIS — M810 Age-related osteoporosis without current pathological fracture: Secondary | ICD-10-CM | POA: Diagnosis not present

## 2020-11-30 DIAGNOSIS — M4010 Other secondary kyphosis, site unspecified: Secondary | ICD-10-CM | POA: Diagnosis not present

## 2020-11-30 DIAGNOSIS — R262 Difficulty in walking, not elsewhere classified: Secondary | ICD-10-CM | POA: Diagnosis not present

## 2020-11-30 DIAGNOSIS — Z4789 Encounter for other orthopedic aftercare: Secondary | ICD-10-CM | POA: Diagnosis not present

## 2020-12-01 ENCOUNTER — Ambulatory Visit (INDEPENDENT_AMBULATORY_CARE_PROVIDER_SITE_OTHER): Payer: Medicare Other | Admitting: *Deleted

## 2020-12-01 ENCOUNTER — Other Ambulatory Visit: Payer: Self-pay

## 2020-12-01 DIAGNOSIS — Z7901 Long term (current) use of anticoagulants: Secondary | ICD-10-CM | POA: Diagnosis not present

## 2020-12-01 LAB — POCT INR: INR: 1.8 — AB (ref 2.0–3.0)

## 2020-12-01 NOTE — Patient Instructions (Signed)
Description   Take 1.5 tablets of warfarin today and then continue to take warfarin 1 tablet daily excpet for 1/2 a tablet on Monday, Wednesday and Fridays. Recheck INR in 2 weeks.  [phone]

## 2020-12-03 ENCOUNTER — Encounter: Payer: Medicare Other | Admitting: Podiatry

## 2020-12-07 ENCOUNTER — Ambulatory Visit: Payer: Medicare Other | Admitting: Podiatry

## 2020-12-08 ENCOUNTER — Telehealth: Payer: Medicare Other

## 2020-12-08 DIAGNOSIS — Z4789 Encounter for other orthopedic aftercare: Secondary | ICD-10-CM | POA: Diagnosis not present

## 2020-12-08 DIAGNOSIS — R262 Difficulty in walking, not elsewhere classified: Secondary | ICD-10-CM | POA: Diagnosis not present

## 2020-12-08 DIAGNOSIS — Z9189 Other specified personal risk factors, not elsewhere classified: Secondary | ICD-10-CM | POA: Diagnosis not present

## 2020-12-08 DIAGNOSIS — M170 Bilateral primary osteoarthritis of knee: Secondary | ICD-10-CM | POA: Diagnosis not present

## 2020-12-08 DIAGNOSIS — M810 Age-related osteoporosis without current pathological fracture: Secondary | ICD-10-CM | POA: Diagnosis not present

## 2020-12-08 DIAGNOSIS — M4010 Other secondary kyphosis, site unspecified: Secondary | ICD-10-CM | POA: Diagnosis not present

## 2020-12-10 ENCOUNTER — Ambulatory Visit: Payer: Medicare Other

## 2020-12-10 DIAGNOSIS — M4010 Other secondary kyphosis, site unspecified: Secondary | ICD-10-CM | POA: Diagnosis not present

## 2020-12-10 DIAGNOSIS — M170 Bilateral primary osteoarthritis of knee: Secondary | ICD-10-CM | POA: Diagnosis not present

## 2020-12-10 DIAGNOSIS — Z4789 Encounter for other orthopedic aftercare: Secondary | ICD-10-CM | POA: Diagnosis not present

## 2020-12-10 DIAGNOSIS — Z9189 Other specified personal risk factors, not elsewhere classified: Secondary | ICD-10-CM | POA: Diagnosis not present

## 2020-12-10 DIAGNOSIS — M810 Age-related osteoporosis without current pathological fracture: Secondary | ICD-10-CM | POA: Diagnosis not present

## 2020-12-10 DIAGNOSIS — R262 Difficulty in walking, not elsewhere classified: Secondary | ICD-10-CM | POA: Diagnosis not present

## 2020-12-10 NOTE — Progress Notes (Incomplete)
Virtual Visit via Telephone Note  I connected with  Robin Harrell on 12/10/20 at  3:15 PM EDT by telephone and verified that I am speaking with the correct person using two identifiers.  Medicare Annual Wellness visit completed telephonically due to Covid-19 pandemic.   Persons participating in this call: This Health Coach and this patient.   Location: Patient: Home Provider: Office   I discussed the limitations, risks, security and privacy concerns of performing an evaluation and management service by telephone and the availability of in person appointments. The patient expressed understanding and agreed to proceed.  Unable to perform video visit due to video visit attempted and failed and/or patient does not have video capability.   Some vital signs may be absent or patient reported.   Willette Brace, LPN   Subjective:   Robin Harrell is a 87 y.o. female who presents for Medicare Annual (Subsequent) preventive examination.  Review of Systems           Objective:    There were no vitals filed for this visit. There is no height or weight on file to calculate BMI.  Advanced Directives 08/27/2020 12/05/2019 02/26/2019 02/25/2019 11/23/2018 08/10/2018 11/09/2017  Does Patient Have a Medical Advance Directive? Yes Yes No No Yes No Yes  Type of Advance Directive Living will;Healthcare Power of [place];Living will - - Living will;Healthcare Power of [place];Living will  Does patient want to make changes to medical advance directive? No - Patient declined - - - No - Patient declined - -  Copy of [place] in Chart? Yes - validated most recent copy scanned in chart (See row information) Yes - validated most recent copy scanned in chart (See row information) - - Yes - validated most recent copy scanned in chart (See row information) - Yes  Would patient like information on creating a medical advance  directive? - - No - Patient declined - - No - Patient declined -    Current Medications (verified) Outpatient Encounter Medications as of 12/10/2020  Medication Sig   acetaminophen (TYLENOL) 500 MG tablet Take 1,000 mg by mouth every 6 (six) hours as needed for moderate pain.   atorvastatin (LIPITOR) 10 MG tablet Take 1 tablet (10 mg total) by mouth daily at 6 PM.   benzonatate (TESSALON) 100 MG capsule Take 1 capsule (100 mg total) by mouth 2 (two) times daily as needed for cough.   brimonidine (ALPHAGAN) 0.15 % ophthalmic solution Place 2 drops into both eyes 2 (two) times daily.    brimonidine (ALPHAGAN) 0.2 % ophthalmic solution SMARTSIG:In Eye(s)   Calcium-Phosphorus-Vitamin D (CALCIUM/D3 ADULT GUMMIES) 200-96.6-200 MG-MG-UNIT CHEW Chew 2 tablets by mouth daily.   fluticasone (FLONASE) 50 MCG/ACT nasal spray SPRAY 2 SPRAYS INTO EACH NOSTRIL EVERY DAY   ipratropium (ATROVENT) 0.03 % nasal spray PLACE 2 SPRAYS INTO BOTH NOSTRILS 3 (THREE) TIMES DAILY AS NEEDED FOR RHINITIS.   latanoprost (XALATAN) 0.005 % ophthalmic solution Place 1 drop into both eyes at bedtime.    levocetirizine (XYZAL) 5 MG tablet Take 1 tablet (5 mg total) by mouth every evening.   levothyroxine (SYNTHROID) 25 MCG tablet TAKE 1 TABLET BY MOUTH DAILY BEFORE BREAKFAST.   losartan (COZAAR) 50 MG tablet Take 1 tablet (50 mg total) by mouth daily.   metoprolol tartrate (LOPRESSOR) 25 MG tablet Take 1 tablet (25 mg total) by mouth 2 (two) times daily.   multivitamin-iron-minerals-folic acid (CENTRUM) chewable tablet  Chew 1 tablet by mouth daily.   vitamin E 1000 UNIT capsule Take 1,000 Units by mouth daily.   warfarin (COUMADIN) 7.5 MG tablet TAKE 1/2 TO 1 TABLET BY MOUTH DAILY AS DIRECTED   No facility-administered encounter medications on file as of 12/10/2020.    Allergies (verified) Azithromycin, Codeine, and Erythromycin   History: Past Medical History:  Diagnosis Date   Allergic rhinitis    Cough     Difficulty in swallowing    w/ occasional aspiration   DVT (deep venous thrombosis) (HCC)    Dyslipidemia    Factor V Leiden deficiency    lifelong coumadin   GERD (gastroesophageal reflux disease)    History of nuclear stress test 12/28/2007   lexiscan; low risk    HTN (hypertension)    PND (post-nasal drip)    Pneumonia due to COVID-19 virus 02/26/2019   02/25/2019-SARS-CoV-2-positive Hospitlized in [name]/2021 - [place] -received IV steroids and remdesivir, did not appear to require mechanical ventilation   Pulmonary embolism (HCC)    PVC's (premature ventricular contractions)    Thyroid disease    Past Surgical History:  Procedure Laterality Date   APPENDECTOMY     BUNIONECTOMY     CATARACT EXTRACTION W/ INTRAOCULAR LENS IMPLANT Right 11/02/2013   CHOLECYSTECTOMY  1973   ESOPHAGEAL DILATION     HAMMER TOE SURGERY     history of sleep study  12/27/2007   AHI during total sleep 0.9/hr and REM 1.5/hr; RDI during total sleep6.0/hr and REM 6.1/hr   REPLACEMENT TOTAL KNEE  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/2002   right   TRANSTHORACIC ECHOCARDIOGRAM  12/28/2007   borderline conc LVH with normal systolic function; MV mod thickened with mild MVP & mild MR   Family History  Problem Relation Age of Onset   Lung cancer Father    Cirrhosis Father    Coronary artery disease Maternal Grandfather        MI   Throat cancer Maternal Grandfather    Heart disease Paternal Grandmother    Coronary artery disease Paternal Grandfather        MI   Throat cancer Paternal Grandfather    Allergic rhinitis Sister    Skin cancer Brother    Breast cancer Daughter 52   Social History   Socioeconomic History   Marital status: Married    Spouse name: Not on file   Number of children: 4   Years of education: 13.5   Highest education level: Not on file  Occupational History   Occupation: Network engineer - retired  Tobacco Use   Smoking status: Former    Years: 1.00    Types: Cigarettes    Quit date: 02/21/1958    Years since quitting:  62.8   Smokeless tobacco: Never  Vaping Use   Vaping Use: Never used  Substance and Sexual Activity   Alcohol use: Yes    Comment: rareley one glass of wine   Drug use: No   Sexual activity: Never  Other Topics Concern   Not on file  Social History Narrative   Not on file   Social Determinants of Health   Financial Resource Strain: Low Risk    Difficulty of Paying Living Expenses: Not hard at all  Food Insecurity: No Food Insecurity   Worried About Charity fundraiser in the Last Year: Never true   Arboriculturist in the Last Year: Never true  Transportation Needs: Unmet Transportation Needs   Lack of Transportation (Medical): Yes   Lack of  Transportation (Non-Medical): No  Physical Activity: Not on file  Stress: Not on file  Social Connections: Not on file    Tobacco Counseling Counseling given: Not Answered   Clinical Intake:                 Diabetic?No         Activities of Daily Living No flowsheet data found.  Patient Care Team: [name], MD as PCP - General (Family Medicine) Lorretta Harp, MD as PCP - Cardiology (Cardiology) Collene Gobble, MD as Consulting Physician (Pulmonary Disease) Lorretta Harp, MD as Consulting Physician (Cardiology) Harriett Sine, MD as Consulting Physician (Dermatology) Katy Apo, MD as Consulting Physician (Ophthalmology) Marzetta Board, DPM as Consulting Physician (Podiatry) Leona Singleton, RN as Case Manager  Indicate any recent Medical Services you may have received from other than [facility] providers in the past year (date may be approximate).     Assessment:   This is a routine wellness examination for Brycelynn.  Hearing/Vision screen No results found.  Dietary issues and exercise activities discussed:     Goals Addressed   None    Depression Screen PHQ 2/9 Scores 08/27/2020 12/05/2019 11/23/2018 08/02/2018 11/09/2017 01/26/2017  PHQ - 2 Score 0 0 0 0 0 0    Fall Risk Fall  Risk  08/27/2020 05/22/2020 12/05/2019 02/25/2019 11/23/2018  Falls in the past year? 1 0 0 1 1  Comment - - - - -  Number falls in past yr: 0 0 0 0 0  Comment fall 07/2020 - - - -  Injury with Fall? 0 0 0 1 1  Risk Factor Category  - - - - -  Risk for fall due to : History of fall(s);Impaired balance/gait;Impaired mobility;Impaired vision;Medication side effect;Orthopedic patient History of fall(s);Impaired balance/gait Impaired balance/gait;Impaired mobility;Impaired vision History of fall(s) Impaired balance/gait;Impaired mobility  Risk for fall due to: Comment - - use glasses for driving - -  Follow up Falls evaluation completed;Education provided;Falls prevention discussed - Falls prevention discussed Education provided Education provided;Falls prevention discussed;Falls evaluation completed    FALL RISK PREVENTION PERTAINING TO THE HOME:  Any stairs in or around the home? {YES/NO:21197} If so, are there any without handrails? {YES/NO:21197} Home free of loose throw rugs in walkways, pet beds, electrical cords, etc? Yes  Adequate lighting in your home to reduce risk of falls? Yes   ASSISTIVE DEVICES UTILIZED TO PREVENT FALLS:  Life alert? {YES/NO:21197} Use of a cane, walker or w/c? {YES/NO:21197} Grab bars in the bathroom? {YES/NO:21197} Shower chair or bench in shower? {YES/NO:21197} Elevated toilet seat or a handicapped toilet? {YES/NO:21197}  TIMED UP AND GO:  Was the test performed? No .   Cognitive Function: MMSE - Mini Mental State Exam 11/09/2017  Orientation to time 5  Orientation to Place 5  Registration 3  Attention/ Calculation 5  Recall 2  Language- name 2 objects 2  Language- repeat 1  Language- follow 3 step command 3  Language- read & follow direction 1  Write a sentence 1  Copy design 1  Total score 29     6CIT Screen 12/05/2019  What Year? 0 points  What month? 0 points  Count back from 20 0 points  Months in reverse 0 points  Repeat phrase 0 points     Immunizations Immunization History  Administered Date(s) Administered   Fluad Quad(high Dose 65+) 10/17/2018, 11/18/2019, 11/04/2020   Influenza Split 11/21/2008, 02/09/2010, 10/24/2011, 11/21/2012   Influenza Whole 11/27/2008  Influenza, High Dose Seasonal PF 11/12/2011, 11/29/2012, 11/05/2013, 01/05/2015, 11/16/2015, 10/21/2016, 11/09/2017   Influenza,inj,Quad PF,6+ Mos 11/23/2015   Influenza-Unspecified 02/02/2011   PFIZER(Purple Top)SARS-COV-2 Vaccination 06/03/2019, 06/17/2019   Pneumococcal Conjugate-13 04/23/2013   Pneumococcal Polysaccharide-23 02/21/2005   Tdap 09/22/2007   Zoster Recombinat (Shingrix) 07/11/2016, 01/27/2017   Zoster, Live 04/22/2010    TDAP status: Due, Education has been provided regarding the importance of this vaccine. Advised may receive this vaccine at local pharmacy or Health Dept. Aware to provide a copy of the vaccination record if obtained from local pharmacy or Health Dept. Verbalized acceptance and understanding.  Flu Vaccine status: Up to date  Pneumococcal vaccine status: Up to date  Covid-19 vaccine status: Completed vaccines  Qualifies for Shingles Vaccine? Yes   Zostavax completed Yes   Shingrix Completed?: Yes  Screening Tests Health Maintenance  Topic Date Due   COVID-19 Vaccine (3 - Booster) 08/12/2019   DEXA SCAN  06/09/2022   Pneumonia Vaccine 70+ Years old  Completed   INFLUENZA VACCINE  Completed   Zoster Vaccines- Shingrix  Completed   HPV VACCINES  Aged Out   TETANUS/TDAP  Discontinued    Health Maintenance  Health Maintenance Due  Topic Date Due   COVID-19 Vaccine (3 - Booster) 08/12/2019    Colorectal cancer screening: No longer required.   Mammogram status: Completed 09/29/17. Repeat every year  Bone Density status: Completed 06/08/20. Results reflect: Bone density results: OSTEOPOROSIS. Repeat every 2 years.   Additional Screening:  Vision Screening: Recommended annual ophthalmology exams for early  detection of glaucoma and other disorders of the eye. Is the patient up to date with their annual eye exam?  {YES/NO:21197} Who is the provider or what is the name of the office in which the patient attends annual eye exams? *** If pt is not established with a provider, would they like to be referred to a provider to establish care? {YES/NO:21197}.   Dental Screening: Recommended annual dental exams for proper oral hygiene  Community Resource Referral / Chronic Care Management: CRR required this visit?  {YES/NO:21197}  CCM required this visit?  {YES/NO:21197}     Plan:     I have personally reviewed and noted the following in the patients chart:   Medical and social history Use of alcohol, tobacco or illicit drugs  Current medications and supplements including opioid prescriptions.  Functional ability and status Nutritional status Physical activity Advanced directives List of other physicians Hospitalizations, surgeries, and ER visits in previous 12 months Vitals Screenings to include cognitive, depression, and falls Referrals and appointments  In addition, I have reviewed and discussed with patient certain preventive protocols, quality metrics, and best practice recommendations. A written personalized care plan for preventive services as well as general preventive health recommendations were provided to patient.     Willette Brace, LPN   [id number]/[id number]   Nurse Notes: ***

## 2020-12-14 ENCOUNTER — Other Ambulatory Visit: Payer: Self-pay

## 2020-12-14 ENCOUNTER — Ambulatory Visit (INDEPENDENT_AMBULATORY_CARE_PROVIDER_SITE_OTHER): Payer: Medicare Other | Admitting: *Deleted

## 2020-12-14 DIAGNOSIS — Z7901 Long term (current) use of anticoagulants: Secondary | ICD-10-CM

## 2020-12-14 DIAGNOSIS — Z5181 Encounter for therapeutic drug level monitoring: Secondary | ICD-10-CM | POA: Diagnosis not present

## 2020-12-14 LAB — POCT INR: INR: 3 (ref 2.0–3.0)

## 2020-12-14 NOTE — Patient Instructions (Signed)
Description   Continue to take warfarin 1 tablet daily excpet for 1/2 a tablet on Monday, Wednesday and Fridays. Recheck INR in 3 weeks.  [id number]

## 2020-12-17 ENCOUNTER — Ambulatory Visit (INDEPENDENT_AMBULATORY_CARE_PROVIDER_SITE_OTHER): Payer: Medicare Other | Admitting: *Deleted

## 2020-12-17 DIAGNOSIS — M159 Polyosteoarthritis, unspecified: Secondary | ICD-10-CM

## 2020-12-17 DIAGNOSIS — I5189 Other ill-defined heart diseases: Secondary | ICD-10-CM

## 2020-12-20 NOTE — Patient Instructions (Signed)
Visit Information  PATIENT GOALS:  Goals Addressed             This Visit's Progress    (RNCM) Manage Chronic Pain-right 2nd toe   On track    Timeframe:  Long-Range Goal Priority:  Medium Start Date:  08/27/2020                           Expected End Date: 06/20/21                      Follow Up Date 01/21/21    Develop a personal pain management plan Plan exercise or activity when pain is best controlled Prioritize tasks for the day Track times pain is worst and when it is best Track what makes the pain worse and what makes it better Use ice or heat for pain relief Work slower and less intense when having pain    Why is this important?   Day-to-day life can be hard when you have chronic pain.  Pain medicine is just one piece of the treatment puzzle.  You can try these action steps to help you manage your pain.    Notes:      (RNCM) Track and Manage Fluids and Swelling-Heart Failure   On track    Timeframe:  Long-Range Goal Priority:  Medium Start Date:    08/27/2020                         Expected End Date: 06/20/2021                      Follow Up Date 01/21/2021    Call office if I gain more than 2 pounds in one day or 5 pounds in one week Keep legs up while sitting Use salt in moderation Watch for swelling in feet, ankles and legs every day Weigh myself daily and record in log, taking log for provider review Schedule appointment with pulmonology   Why is this important?   It is important to check your weight daily and watch how much salt and liquids you have.  It will help you to manage your heart failure.    Notes:         Patient verbalizes understanding of instructions provided today and agrees to view in [place].   The care management team will reach out to the patient again over the next 45 days.   [name] RN, MSN RN Care Management Coordinator  [place] [phone] [email or web address]

## 2020-12-20 NOTE — Chronic Care Management (AMB) (Signed)
Chronic Care Management   CCM RN Visit Note  12/20/2020 Name: Robin Harrell MRN: [id number] DOB: 1933-04-23  Subjective: Robin Harrell is a 87 y.o. year old female who is a primary care patient of [name], MD. The care management team was consulted for assistance with disease management and care coordination needs.    Engaged with patient by telephone for follow up visit in response to provider referral for case management and/or care coordination services.   Consent to Services:  The patient was given information about Chronic Care Management services, agreed to services, and gave verbal consent prior to initiation of services.  Please see initial visit note for detailed documentation.   Patient agreed to services and verbal consent obtained.   Assessment: Review of patient past medical history, allergies, medications, health status, including review of consultants reports, laboratory and other test data, was performed as part of comprehensive evaluation and provision of chronic care management services.   SDOH (Social Determinants of Health) assessments and interventions performed:    CCM Care Plan  Allergies  Allergen Reactions   Azithromycin Nausea Only   Codeine Nausea Only   Erythromycin Nausea And Vomiting    Outpatient Encounter Medications as of 12/17/2020  Medication Sig   acetaminophen (TYLENOL) 500 MG tablet Take 1,000 mg by mouth every 6 (six) hours as needed for moderate pain.   atorvastatin (LIPITOR) 10 MG tablet Take 1 tablet (10 mg total) by mouth daily at 6 PM.   benzonatate (TESSALON) 100 MG capsule Take 1 capsule (100 mg total) by mouth 2 (two) times daily as needed for cough.   brimonidine (ALPHAGAN) 0.15 % ophthalmic solution Place 2 drops into both eyes 2 (two) times daily.    brimonidine (ALPHAGAN) 0.2 % ophthalmic solution SMARTSIG:In Eye(s)   Calcium-Phosphorus-Vitamin D (CALCIUM/D3 ADULT GUMMIES) 200-96.6-200 MG-MG-UNIT CHEW Chew 2  tablets by mouth daily.   fluticasone (FLONASE) 50 MCG/ACT nasal spray SPRAY 2 SPRAYS INTO EACH NOSTRIL EVERY DAY   ipratropium (ATROVENT) 0.03 % nasal spray PLACE 2 SPRAYS INTO BOTH NOSTRILS 3 (THREE) TIMES DAILY AS NEEDED FOR RHINITIS.   latanoprost (XALATAN) 0.005 % ophthalmic solution Place 1 drop into both eyes at bedtime.    levocetirizine (XYZAL) 5 MG tablet Take 1 tablet (5 mg total) by mouth every evening.   levothyroxine (SYNTHROID) 25 MCG tablet TAKE 1 TABLET BY MOUTH DAILY BEFORE BREAKFAST.   losartan (COZAAR) 50 MG tablet Take 1 tablet (50 mg total) by mouth daily.   metoprolol tartrate (LOPRESSOR) 25 MG tablet Take 1 tablet (25 mg total) by mouth 2 (two) times daily.   multivitamin-iron-minerals-folic acid (CENTRUM) chewable tablet Chew 1 tablet by mouth daily.   vitamin E 1000 UNIT capsule Take 1,000 Units by mouth daily.   warfarin (COUMADIN) 7.5 MG tablet TAKE 1/2 TO 1 TABLET BY MOUTH DAILY AS DIRECTED   No facility-administered encounter medications on file as of 12/17/2020.    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 01/29/2019   Chronic venous stasis dermatitis of both lower extremities 12/31/2018   Aortic stenosis, mild 02/07/2017   History of recurrent UTIs 01/26/2017   Chronic idiopathic thrombocytopenia ([place]) 04/14/2016   Essential tremor 09/03/2015   History of respiratory failure 11/11/2014   Hypothyroidism (acquired) 09/02/2014   Edema of right lower extremity 02/01/2013   Hyperlipidemia 11/19/2012   Colon polyp 10/15/2012  Long term (current) use of anticoagulants 05/10/2012   Insomnia 10/11/2011   Osteoarthritis, multiple sites 36/14/4315   Diastolic dysfunction 40/09/6759   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   Osteoporosis 02/09/2010   DJD (degenerative joint disease), lumbar 10/14/2009   Glaucoma 01/29/2008   Essential  hypertension 11/15/2007   History of DVT (deep vein thrombosis) 11/15/2007   Seasonal and perennial allergic rhinitis 11/15/2007   GERD 11/15/2007   COUGH, CHRONIC 11/15/2007    Conditions to be addressed/monitored:CHF and Pain  Care Plan : Heart Failure (Adult)  Updates made by Robin Singleton, RN since 12/20/2020 12:00 AM     Problem: Symptom Exacerbation (Heart Failure)   Priority: Medium     Long-Range Goal: Symptom Exacerbation Prevented or Minimized   Start Date: 08/27/2020  Expected End Date: 02/19/2021  This Visit's Progress: On track  Recent Progress: On track  Priority: Medium  Note:   Current Barriers:  Knowledge deficit related to basic heart failure pathophysiology and self care management patient with history of diastolic dysfunction and chronic lower extremity edema.  States she does weigh herself daily.  Weight this morning was 112.6 pounds.  Denies any shortness of breath or swelling; patient encouraged to schedule pulmonology appointment Lacks reliable transportation; patient and spouse are no longer able to drive and are dependent upon friends for transportation to medical appointments; no family in town; Care guide referral placed for transportation resources Case Manager Clinical Goal(s):  patient will weigh self daily and record patient will verbalize understanding of Heart Failure Action Plan and when to call doctor patient will weigh daily and record (notifying MD of 3 lb weight gain over night or 5 lb in a week) Interventions:  Collaboration with [name], MD regarding development and update of comprehensive plan of care as evidenced by provider attestation and co-signature Inter-disciplinary care team collaboration (see longitudinal plan of care) Basic overview and discussion of pathophysiology of Heart Failure reviewed  Provided verbal education on low sodium diet Provided written education on low sodium diet Reviewed Heart Failure Action Plan in  depth and provided written copy Advised patient to weigh each morning after emptying bladder Discussed importance of daily weight and advised patient to weigh and record daily; discussed when to call provider based on weight. Barriers to lifestyle changes reviewed and addressed and barriers to treatment reviewed and addressed Depression screen reviewed Healthy lifestyle promoted Medication-adherence assessment completed Self-awareness of signs/symptoms of worsening disease encouraged Encouraged to attend all scheduled provider appointments Encouraged to call provider office for new concerns or questions Sent Living Better with Heart Failure Education Packet and encouraged patient to review Patient Goals/Self-Care Activities: Call office if I gain more than 2 pounds in one day or 5 pounds in one week Keep legs up while sitting Use salt in moderation Watch for swelling in feet, ankles and legs every day Weigh myself daily and record in log, taking log for provider review Schedule appointment with pulmonology Follow Up Plan: The care management team will reach out to the patient again over the next 30 business days.     Care Plan : Chronic Pain (Adult)  Updates made by Robin Singleton, RN since 12/20/2020 12:00 AM     Problem: Chronic Pain Management (Chronic Pain)   Priority: Medium     Long-Range Goal: Chronic Pain Managed   Start Date: 08/27/2020  Expected End Date: 06/20/2021  This Visit's Progress: On track  Recent Progress: On track  Priority: Medium  Note:  Current Barriers:  Knowledge Deficits related to self-health management of chronic pain of right 2nd toe; patient reporting surgery completed about 6 weeks ago; no complications; minimal to no pain Chronic Disease Management support and education needs related to chronic pain Lacks caregiver support. Has hired caregiver that assist patient twice a week.  All children live out of town. Transportation barriers; no longer  drives; Care Guide referral placed for transportation options Clinical Goal(s):  patient will verbalize understanding of plan for pain management. , patient will demonstrate use of different relaxation  skills and/or diversional activities to assist with pain reduction (distraction, imagery, relaxation, massage, acupressure, TENS, heat, and cold application., patient will report pain at a level less than 3 to 4 on a 10-10 rating scale., patient will use pharmacological and nonpharmacological pain relief strategies as prescribed. , patient will verbalize acceptable level of pain relief and ability to engage in desired activities, and patient will engage in desired activities without an increase in pain level Interventions:  Collaboration with [name], MD regarding development and update of comprehensive plan of care as evidenced by provider attestation and co-signature Pain assessment performed Medications reviewed Discussed plans with patient for ongoing care management follow up and provided patient with direct contact information for care management team Evaluation of current treatment plan related to toe pain and patient's adherence to plan as established by provider. Care Guide referral for transportation option resources Careful application of heat or ice encouraged Deep breathing, relaxation and mindfulness use promoted Motivation and barriers to change assessed and addressed Pain assessed and pain treatment goals reviewed Premedication prior to activity encouraged Assessed pain using self-report (most reliable), family/caregiver report, validated pain scale; considered impact on quality of life Encouraged patient to stay in close contact with surgeon and to monitor incisions options with provider and timing of surgery Emotional support and empathy provided to patient Confirmed patient has used [employer] without difficulties and provided patient number again Patient  Goals/Self Care Activities:  Develop a personal pain management plan Plan exercise or activity when pain is best controlled Prioritize tasks for the day Track times pain is worst and when it is best Track what makes the pain worse and what makes it better Use ice or heat for pain relief Work slower and less intense when having pain Follow Up Plan: The care management team will reach out to the patient again over the next 30 business days.       Plan:The care management team will reach out to the patient again over the next 45 days.  [name] RN, MSN RN Care Management Coordinator  [facility] [phone] [email or web address]

## 2020-12-21 DIAGNOSIS — M159 Polyosteoarthritis, unspecified: Secondary | ICD-10-CM | POA: Diagnosis not present

## 2020-12-25 ENCOUNTER — Ambulatory Visit: Payer: Medicare Other

## 2020-12-25 ENCOUNTER — Ambulatory Visit (INDEPENDENT_AMBULATORY_CARE_PROVIDER_SITE_OTHER): Payer: Medicare Other

## 2020-12-25 DIAGNOSIS — Z0000 Encounter for general adult medical examination without abnormal findings: Secondary | ICD-10-CM

## 2020-12-25 DIAGNOSIS — Z23 Encounter for immunization: Secondary | ICD-10-CM | POA: Diagnosis not present

## 2020-12-25 NOTE — Patient Instructions (Signed)
[name] , Thank you for taking time to come for your Medicare Wellness Visit. I appreciate your ongoing commitment to your health goals. Please review the following plan we discussed and let me know if I can assist you in the future.   Screening recommendations/referrals: Colonoscopy: No longer required  Mammogram: no longer required  Bone Density: Done 06/08/20 repeat every 2 years  Recommended yearly ophthalmology/optometry visit for glaucoma screening and checkup Recommended yearly dental visit for hygiene and checkup  Vaccinations: Influenza vaccine: Done 11/04/20  Pneumococcal vaccine: Up to date Tdap vaccine: Due and discussed  Shingles vaccine: 5/21 & 01/27/17   Covid-19:Completed 4/12 & 06/17/19  Advanced directives: Copies in chart  Conditions/risks identified: None at this time   Next appointment: Follow up in one year for your annual wellness visit    Preventive Care 32 Years and Older, Female Preventive care refers to lifestyle choices and visits with your health care provider that can promote health and wellness. What does preventive care include? A yearly physical exam. This is also called an annual well check. Dental exams once or twice a year. Routine eye exams. Ask your health care provider how often you should have your eyes checked. Personal lifestyle choices, including: Daily care of your teeth and gums. Regular physical activity. Eating a healthy diet. Avoiding tobacco and drug use. Limiting alcohol use. Practicing safe sex. Taking low-dose aspirin every day. Taking vitamin and mineral supplements as recommended by your health care provider. What happens during an annual well check? The services and screenings done by your health care provider during your annual well check will depend on your age, overall health, lifestyle risk factors, and family history of disease. Counseling  Your health care provider may ask you questions about your: Alcohol  use. Tobacco use. Drug use. Emotional well-being. Home and relationship well-being. Sexual activity. Eating habits. History of falls. Memory and ability to understand (cognition). Work and work Statistician. Reproductive health. Screening  You may have the following tests or measurements: Height, weight, and BMI. Blood pressure. Lipid and cholesterol levels. These may be checked every 5 years, or more frequently if you are over 89 years old. Skin check. Lung cancer screening. You may have this screening every year starting at age 71 if you have a 30-pack-year history of smoking and currently smoke or have quit within the past 15 years. Fecal occult blood test (FOBT) of the stool. You may have this test every year starting at age 10. Flexible sigmoidoscopy or colonoscopy. You may have a sigmoidoscopy every 5 years or a colonoscopy every 10 years starting at age 74. Hepatitis C blood test. Hepatitis B blood test. Sexually transmitted disease (STD) testing. Diabetes screening. This is done by checking your blood sugar (glucose) after you have not eaten for a while (fasting). You may have this done every 1-3 years. Bone density scan. This is done to screen for osteoporosis. You may have this done starting at age 81. Mammogram. This may be done every 1-2 years. Talk to your health care provider about how often you should have regular mammograms. Talk with your health care provider about your test results, treatment options, and if necessary, the need for more tests. Vaccines  Your health care provider may recommend certain vaccines, such as: Influenza vaccine. This is recommended every year. Tetanus, diphtheria, and acellular pertussis (Tdap, Td) vaccine. You may need a Td booster every 10 years. Zoster vaccine. You may need this after age 85. Pneumococcal 13-valent conjugate (PCV13) vaccine.  One dose is recommended after age 67. Pneumococcal polysaccharide (PPSV23) vaccine. One dose is  recommended after age 24. Talk to your health care provider about which screenings and vaccines you need and how often you need them. This information is not intended to replace advice given to you by your health care provider. Make sure you discuss any questions you have with your health care provider. Document Released: 03/06/2015 Document Revised: 10/28/2015 Document Reviewed: 12/09/2014 Elsevier Interactive Patient Education  2017 [place] Prevention in the Home Falls can cause injuries. They can happen to people of all ages. There are many things you can do to make your home safe and to help prevent falls. What can I do on the outside of my home? Regularly fix the edges of walkways and driveways and fix any cracks. Remove anything that might make you trip as you walk through a door, such as a raised step or threshold. Trim any bushes or trees on the path to your home. Use bright outdoor lighting. Clear any walking paths of anything that might make someone trip, such as rocks or tools. Regularly check to see if handrails are loose or broken. Make sure that both sides of any steps have handrails. Any raised decks and porches should have guardrails on the edges. Have any leaves, snow, or ice cleared regularly. Use sand or salt on walking paths during winter. Clean up any spills in your garage right away. This includes oil or grease spills. What can I do in the bathroom? Use night lights. Install grab bars by the toilet and in the tub and shower. Do not use towel bars as grab bars. Use non-skid mats or decals in the tub or shower. If you need to sit down in the shower, use a plastic, non-slip stool. Keep the floor dry. Clean up any water that spills on the floor as soon as it happens. Remove soap buildup in the tub or shower regularly. Attach bath mats securely with double-sided non-slip rug tape. Do not have throw rugs and other things on the floor that can make you  trip. What can I do in the bedroom? Use night lights. Make sure that you have a light by your bed that is easy to reach. Do not use any sheets or blankets that are too big for your bed. They should not hang down onto the floor. Have a firm chair that has side arms. You can use this for support while you get dressed. Do not have throw rugs and other things on the floor that can make you trip. What can I do in the kitchen? Clean up any spills right away. Avoid walking on wet floors. Keep items that you use a lot in easy-to-reach places. If you need to reach something above you, use a strong step stool that has a grab bar. Keep electrical cords out of the way. Do not use floor polish or wax that makes floors slippery. If you must use wax, use non-skid floor wax. Do not have throw rugs and other things on the floor that can make you trip. What can I do with my stairs? Do not leave any items on the stairs. Make sure that there are handrails on both sides of the stairs and use them. Fix handrails that are broken or loose. Make sure that handrails are as long as the stairways. Check any carpeting to make sure that it is firmly attached to the stairs. Fix any carpet that is loose or  worn. Avoid having throw rugs at the top or bottom of the stairs. If you do have throw rugs, attach them to the floor with carpet tape. Make sure that you have a light switch at the top of the stairs and the bottom of the stairs. If you do not have them, ask someone to add them for you. What else can I do to help prevent falls? Wear shoes that: Do not have high heels. Have rubber bottoms. Are comfortable and fit you well. Are closed at the toe. Do not wear sandals. If you use a stepladder: Make sure that it is fully opened. Do not climb a closed stepladder. Make sure that both sides of the stepladder are locked into place. Ask someone to hold it for you, if possible. Clearly mark and make sure that you can  see: Any grab bars or handrails. First and last steps. Where the edge of each step is. Use tools that help you move around (mobility aids) if they are needed. These include: Canes. Walkers. Scooters. Crutches. Turn on the lights when you go into a dark area. Replace any light bulbs as soon as they burn out. Set up your furniture so you have a clear path. Avoid moving your furniture around. If any of your floors are uneven, fix them. If there are any pets around you, be aware of where they are. Review your medicines with your doctor. Some medicines can make you feel dizzy. This can increase your chance of falling. Ask your doctor what other things that you can do to help prevent falls. This information is not intended to replace advice given to you by your health care provider. Make sure you discuss any questions you have with your health care provider. Document Released: 12/04/2008 Document Revised: 07/16/2015 Document Reviewed: 03/14/2014 Elsevier Interactive Patient Education  2017 [employer].

## 2020-12-25 NOTE — Progress Notes (Addendum)
Virtual Visit via Telephone Note  I connected with  Sharra Cayabyab Mccarry on 12/25/20 at 11:00 AM EDT by telephone and verified that I am speaking with the correct person using two identifiers.  Medicare Annual Wellness visit completed telephonically due to Covid-19 pandemic.   Persons participating in this call: This Health Coach and this patient.   Location: Patient: Home Provider: Office   I discussed the limitations, risks, security and privacy concerns of performing an evaluation and management service by telephone and the availability of in person appointments. The patient expressed understanding and agreed to proceed.  Unable to perform video visit due to video visit attempted and failed and/or patient does not have video capability.   Some vital signs may be absent or patient reported.   Willette Brace, LPN   Subjective:   PRESTINA RAIGOZA is a 87 y.o. female who presents for Medicare Annual (Subsequent) preventive examination.  Review of Systems     Cardiac Risk Factors include: advanced age (>56men, >58 women);hypertension     Objective:    There were no vitals filed for this visit. There is no height or weight on file to calculate BMI.  Advanced Directives 12/25/2020 08/27/2020 12/05/2019 02/26/2019 02/25/2019 11/23/2018 08/10/2018  Does Patient Have a Medical Advance Directive? Yes Yes Yes No No Yes No  Type of Advance Directive Healthcare Power of Attorney Living will;Healthcare Power of [place];Living will - - Living will;Healthcare Power of Attorney -  Does patient want to make changes to medical advance directive? - No - Patient declined - - - No - Patient declined -  Copy of [place] in Chart? Yes - validated most recent copy scanned in chart (See row information) Yes - validated most recent copy scanned in chart (See row information) Yes - validated most recent copy scanned in chart (See row information) - - Yes -  validated most recent copy scanned in chart (See row information) -  Would patient like information on creating a medical advance directive? - - - No - Patient declined - - No - Patient declined    Current Medications (verified) Outpatient Encounter Medications as of 12/25/2020  Medication Sig   acetaminophen (TYLENOL) 500 MG tablet Take 1,000 mg by mouth every 6 (six) hours as needed for moderate pain.   atorvastatin (LIPITOR) 10 MG tablet Take 1 tablet (10 mg total) by mouth daily at 6 PM.   benzonatate (TESSALON) 100 MG capsule Take 1 capsule (100 mg total) by mouth 2 (two) times daily as needed for cough.   brimonidine (ALPHAGAN) 0.15 % ophthalmic solution Place 2 drops into both eyes 2 (two) times daily.    Calcium-Phosphorus-Vitamin D (CALCIUM/D3 ADULT GUMMIES) 200-96.6-200 MG-MG-UNIT CHEW Chew 2 tablets by mouth daily.   fluticasone (FLONASE) 50 MCG/ACT nasal spray SPRAY 2 SPRAYS INTO EACH NOSTRIL EVERY DAY   ipratropium (ATROVENT) 0.03 % nasal spray PLACE 2 SPRAYS INTO BOTH NOSTRILS 3 (THREE) TIMES DAILY AS NEEDED FOR RHINITIS.   latanoprost (XALATAN) 0.005 % ophthalmic solution Place 1 drop into both eyes at bedtime.    levocetirizine (XYZAL) 5 MG tablet Take 1 tablet (5 mg total) by mouth every evening.   levothyroxine (SYNTHROID) 25 MCG tablet TAKE 1 TABLET BY MOUTH DAILY BEFORE BREAKFAST.   losartan (COZAAR) 50 MG tablet Take 1 tablet (50 mg total) by mouth daily.   metoprolol tartrate (LOPRESSOR) 25 MG tablet Take 1 tablet (25 mg total) by mouth 2 (two) times daily.  multivitamin-iron-minerals-folic acid (CENTRUM) chewable tablet Chew 1 tablet by mouth daily.   vitamin E 1000 UNIT capsule Take 1,000 Units by mouth daily.   warfarin (COUMADIN) 7.5 MG tablet TAKE 1/2 TO 1 TABLET BY MOUTH DAILY AS DIRECTED (Patient taking differently: TAKE 1/2 TO 1 TABLET BY M-W-F AS DIRECTED)   [DISCONTINUED] brimonidine (ALPHAGAN) 0.2 % ophthalmic solution SMARTSIG:In Eye(s)   No  facility-administered encounter medications on file as of 12/25/2020.    Allergies (verified) Azithromycin, Codeine, and Erythromycin   History: Past Medical History:  Diagnosis Date   Allergic rhinitis    Cough    Difficulty in swallowing    w/ occasional aspiration   DVT (deep venous thrombosis) (HCC)    Dyslipidemia    Factor V Leiden deficiency    lifelong coumadin   GERD (gastroesophageal reflux disease)    History of nuclear stress test 12/28/2007   lexiscan; low risk    HTN (hypertension)    PND (post-nasal drip)    Pneumonia due to COVID-19 virus 02/26/2019   02/25/2019-SARS-CoV-2-positive Hospitlized in [name]/2021 - [place] -received IV steroids and remdesivir, did not appear to require mechanical ventilation   Pulmonary embolism (HCC)    PVC's (premature ventricular contractions)    Thyroid disease    Past Surgical History:  Procedure Laterality Date   APPENDECTOMY     BUNIONECTOMY     CATARACT EXTRACTION W/ INTRAOCULAR LENS IMPLANT Right 11/02/2013   CHOLECYSTECTOMY  1973   ESOPHAGEAL DILATION     HAMMER TOE SURGERY     history of sleep study  12/27/2007   AHI during total sleep 0.9/hr and REM 1.5/hr; RDI during total sleep6.0/hr and REM 6.1/hr   REPLACEMENT TOTAL KNEE  01/11/2002   right   TRANSTHORACIC ECHOCARDIOGRAM  12/28/2007   borderline conc LVH with normal systolic function; MV mod thickened with mild MVP & mild MR   Family History  Problem Relation Age of Onset   Lung cancer Father    Cirrhosis Father    Coronary artery disease Maternal Grandfather        MI   Throat cancer Maternal Grandfather    Heart disease Paternal Grandmother    Coronary artery disease Paternal Grandfather        MI   Throat cancer Paternal Grandfather    Allergic rhinitis Sister    Skin cancer Brother    Breast cancer Daughter 55   Social History   Socioeconomic History   Marital status: Married    Spouse name: Not on file   Number of children: 4   Years of education: 13.5    Highest education level: Not on file  Occupational History   Occupation: Network engineer - retired  Tobacco Use   Smoking status: Former    Years: 1.00    Types: Cigarettes    Quit date: 02/21/1958    Years since quitting: 62.8   Smokeless tobacco: Never  Vaping Use   Vaping Use: Never used  Substance and Sexual Activity   Alcohol use: Yes    Comment: rareley one glass of wine   Drug use: No   Sexual activity: Never  Other Topics Concern   Not on file  Social History Narrative   Not on file   Social Determinants of Health   Financial Resource Strain: Low Risk    Difficulty of Paying Living Expenses: Not hard at all  Food Insecurity: No Food Insecurity   Worried About Estate manager/land agent of Food in the Last Year: Never true  Ran Out of Food in the Last Year: Never true  Transportation Needs: No Transportation Needs   Lack of Transportation (Medical): No   Lack of Transportation (Non-Medical): No  Physical Activity: Inactive   Days of Exercise per Week: 0 days   Minutes of Exercise per Session: 0 min  Stress: Stress Concern Present   Feeling of Stress : To some extent  Social Connections: Moderately Integrated   Frequency of Communication with Friends and Family: More than three times a week   Frequency of Social Gatherings with Friends and Family: More than three times a week   Attends Religious Services: More than 4 times per year   Active Member of [employer] or Organizations: No   Attends Music therapist: Never   Marital Status: Married    Tobacco Counseling Counseling given: Not Answered   Clinical Intake:  Pre-visit preparation completed: Yes  Pain : No/denies pain     BMI - recorded: 19.5 Nutritional Status: BMI of 19-24  Normal Nutritional Risks: None Diabetes: No  How often do you need to have someone help you when you read instructions, pamphlets, or other written materials from your doctor or pharmacy?: 1 - Never  Diabetic?No  Interpreter  Needed?: No  Information entered by :: Charlott Rakes, LPN   Activities of Daily Living In your present state of health, do you have any difficulty performing the following activities: 12/25/2020  Hearing? N  Vision? N  Difficulty concentrating or making decisions? N  Walking or climbing stairs? Y  Dressing or bathing? N  Doing errands, shopping? N  Preparing Food and eating ? N  Using the Toilet? N  In the past six months, have you accidently leaked urine? Y  Comment wears depends  Do you have problems with loss of bowel control? N  Managing your Medications? N  Managing your Finances? N  Housekeeping or managing your Housekeeping? N  Some recent data might be hidden    Patient Care Team: [name], MD as PCP - General (Family Medicine) Lorretta Harp, MD as PCP - Cardiology (Cardiology) Collene Gobble, MD as Consulting Physician (Pulmonary Disease) Lorretta Harp, MD as Consulting Physician (Cardiology) Harriett Sine, MD as Consulting Physician (Dermatology) Katy Apo, MD as Consulting Physician (Ophthalmology) Marzetta Board, DPM as Consulting Physician (Podiatry) Leona Singleton, RN as Case Manager  Indicate any recent Medical Services you may have received from other than [facility] providers in the past year (date may be approximate).     Assessment:   This is a routine wellness examination for Elisandra.  Hearing/Vision screen Hearing Screening - Comments:: Pt denies any hearing  Vision Screening - Comments:: Pt follows up with Dr Katy Apo  for annual eye exams   Dietary issues and exercise activities discussed: Current Exercise Habits: The patient does not participate in regular exercise at present   Goals Addressed             This Visit's Progress    Patient Stated       None at this time        Depression Screen PHQ 2/9 Scores 12/25/2020 08/27/2020 12/05/2019 11/23/2018 08/02/2018 11/09/2017 01/26/2017  PHQ - 2 Score 0 0 0 0 0 0  0    Fall Risk Fall Risk  12/25/2020 08/27/2020 05/22/2020 12/05/2019 02/25/2019  Falls in the past year? 1 1 0 0 1  Comment - - - - -  Number falls in past yr: - 0 0 0  0  Comment - fall 07/2020 - - -  Injury with Fall? 0 0 0 0 1  Risk Factor Category  - - - - -  Risk for fall due to : Impaired balance/gait;Impaired mobility;Impaired vision History of fall(s);Impaired balance/gait;Impaired mobility;Impaired vision;Medication side effect;Orthopedic patient History of fall(s);Impaired balance/gait Impaired balance/gait;Impaired mobility;Impaired vision History of fall(s)  Risk for fall due to: Comment - - - use glasses for driving -  Follow up Falls prevention discussed Falls evaluation completed;Education provided;Falls prevention discussed - Falls prevention discussed Education provided    FALL RISK PREVENTION PERTAINING TO THE HOME:  Any stairs in or around the home? Yes  If so, are there any without handrails? No  Home free of loose throw rugs in walkways, pet beds, electrical cords, etc? Yes  Adequate lighting in your home to reduce risk of falls? Yes   ASSISTIVE DEVICES UTILIZED TO PREVENT FALLS:  Life alert? Yes mobile health wrist band  Use of a cane, walker or w/c? Yes  Grab bars in the bathroom? Yes  Shower chair or bench in shower? Yes  Elevated toilet seat or a handicapped toilet? Yes   TIMED UP AND GO:  Was the test performed? No .   Cognitive Function: MMSE - Mini Mental State Exam 11/09/2017  Orientation to time 5  Orientation to Place 5  Registration 3  Attention/ Calculation 5  Recall 2  Language- name 2 objects 2  Language- repeat 1  Language- follow 3 step command 3  Language- read & follow direction 1  Write a sentence 1  Copy design 1  Total score 29     6CIT Screen 12/25/2020 12/05/2019  What Year? 0 points 0 points  What month? 0 points 0 points  What time? 0 points -  Count back from 20 0 points 0 points  Months in reverse 4 points 0 points  Repeat  phrase 0 points 0 points  Total Score 4 -    Immunizations Immunization History  Administered Date(s) Administered   Fluad Quad(high Dose 65+) 10/17/2018, 11/18/2019, 11/04/2020   Influenza Split 11/21/2008, 02/09/2010, 10/24/2011, 11/21/2012   Influenza Whole 11/27/2008   Influenza, High Dose Seasonal PF 11/12/2011, 11/29/2012, 11/05/2013, 01/05/2015, 11/16/2015, 10/21/2016, 11/09/2017   Influenza,inj,Quad PF,6+ Mos 11/23/2015   Influenza-Unspecified 02/02/2011   PFIZER(Purple Top)SARS-COV-2 Vaccination 06/03/2019, 06/17/2019   Pneumococcal Conjugate-13 04/23/2013   Pneumococcal Polysaccharide-23 02/21/2005   Tdap 09/22/2007   Zoster Recombinat (Shingrix) 07/11/2016, 01/27/2017   Zoster, Live 04/22/2010    TDAP status: Due, Education has been provided regarding the importance of this vaccine. Advised may receive this vaccine at local pharmacy or Health Dept. Aware to provide a copy of the vaccination record if obtained from local pharmacy or Health Dept. Verbalized acceptance and understanding.  Flu Vaccine status: Up to date  Pneumococcal vaccine status: Up to date  Covid-19 vaccine status: Completed vaccines  Qualifies for Shingles Vaccine? Yes   Zostavax completed Yes   Shingrix Completed?: Yes  Screening Tests Health Maintenance  Topic Date Due   COVID-19 Vaccine (3 - Booster) 08/12/2019   DEXA SCAN  06/09/2022   Pneumonia Vaccine 5+ Years old  Completed   INFLUENZA VACCINE  Completed   Zoster Vaccines- Shingrix  Completed   HPV VACCINES  Aged Out   TETANUS/TDAP  Discontinued    Health Maintenance  Health Maintenance Due  Topic Date Due   COVID-19 Vaccine (3 - Booster) 08/12/2019    Colorectal cancer screening: No longer required.   Mammogram status: No  longer required due to age.  Bone Density status: Completed 06/08/20. Results reflect: Bone density results: OSTEOPOROSIS. Repeat every 2 years.    Additional Screening:   Vision Screening:  Recommended annual ophthalmology exams for early detection of glaucoma and other disorders of the eye. Is the patient up to date with their annual eye exam?  Yes  Who is the provider or what is the name of the office in which the patient attends annual eye exams? Dr Prudencio Burly  If pt is not established with a provider, would they like to be referred to a provider to establish care? No .   Dental Screening: Recommended annual dental exams for proper oral hygiene  Community Resource Referral / Chronic Care Management: CRR required this visit?  No   CCM required this visit?  No      Plan:     I have personally reviewed and noted the following in the patient's chart:   Medical and social history Use of alcohol, tobacco or illicit drugs  Current medications and supplements including opioid prescriptions.  Functional ability and status Nutritional status Physical activity Advanced directives List of other physicians Hospitalizations, surgeries, and ER visits in previous 12 months Vitals Screenings to include cognitive, depression, and falls Referrals and appointments  In addition, I have reviewed and discussed with patient certain preventive protocols, quality metrics, and best practice recommendations. A written personalized care plan for preventive services as well as general preventive health recommendations were provided to patient.     Willette Brace, LPN   [id number]   Nurse Notes: none

## 2021-01-04 ENCOUNTER — Other Ambulatory Visit: Payer: Self-pay

## 2021-01-04 ENCOUNTER — Ambulatory Visit (INDEPENDENT_AMBULATORY_CARE_PROVIDER_SITE_OTHER): Payer: Medicare Other | Admitting: *Deleted

## 2021-01-04 DIAGNOSIS — Z7901 Long term (current) use of anticoagulants: Secondary | ICD-10-CM | POA: Diagnosis not present

## 2021-01-04 DIAGNOSIS — Z5181 Encounter for therapeutic drug level monitoring: Secondary | ICD-10-CM | POA: Diagnosis not present

## 2021-01-04 LAB — POCT INR: INR: 2.4 (ref 2.0–3.0)

## 2021-01-04 NOTE — Patient Instructions (Signed)
Description   Continue to take warfarin 1 tablet daily excpet for 1/2 a tablet on Monday, Wednesday and Fridays. Recheck INR in 5 weeks.  [phone]

## 2021-01-11 ENCOUNTER — Encounter: Payer: Self-pay | Admitting: Podiatry

## 2021-01-11 ENCOUNTER — Other Ambulatory Visit: Payer: Self-pay

## 2021-01-11 ENCOUNTER — Ambulatory Visit (INDEPENDENT_AMBULATORY_CARE_PROVIDER_SITE_OTHER): Payer: Medicare Other | Admitting: Podiatry

## 2021-01-11 DIAGNOSIS — D6851 Activated protein C resistance: Secondary | ICD-10-CM

## 2021-01-11 DIAGNOSIS — L84 Corns and callosities: Secondary | ICD-10-CM

## 2021-01-11 DIAGNOSIS — D689 Coagulation defect, unspecified: Secondary | ICD-10-CM

## 2021-01-11 DIAGNOSIS — B351 Tinea unguium: Secondary | ICD-10-CM | POA: Diagnosis not present

## 2021-01-11 DIAGNOSIS — M2041 Other hammer toe(s) (acquired), right foot: Secondary | ICD-10-CM

## 2021-01-11 DIAGNOSIS — M2042 Other hammer toe(s) (acquired), left foot: Secondary | ICD-10-CM

## 2021-01-11 DIAGNOSIS — M79676 Pain in unspecified toe(s): Secondary | ICD-10-CM

## 2021-01-12 DIAGNOSIS — Z20822 Contact with and (suspected) exposure to covid-19: Secondary | ICD-10-CM | POA: Diagnosis not present

## 2021-01-15 NOTE — Progress Notes (Signed)
  Subjective:  Patient ID: Robin Harrell, female    DOB: May 09, 1933,  MRN: [id number]  Robin Harrell presents to clinic today for at risk foot care with h/o clotting disorder and corn(s) right 2nd digit which has been addressed surgically by Dr. Sherryle Lis and painful thick toenails that are difficult to trim. Painful toenails interfere with ambulation. Aggravating factors include wearing enclosed shoe gear. Pain is relieved with periodic professional debridement. Painful corns are aggravated when weightbearing when wearing enclosed shoe gear. Pain is relieved with periodic professional debridement.  She states her right 2nd digit feels so much better since her surgical procedure. She notes pain of left 4th digit today. States she appears to be developing a lesion on the toe now. Denies any redness, drainage or swelling. She is accompanied by her caregiver who drives her to her appointment.   PCP is [name], MD , and last visit was 11/04/2020.  Allergies  Allergen Reactions   Azithromycin Nausea Only   Codeine Nausea Only   Erythromycin Nausea And Vomiting    Review of Systems: Negative except as noted in the HPI. Objective:   Constitutional RYNLEE LISBON is a pleasant 87 y.o. Caucasian female, WD, WN in NAD. AAO x 3.   Vascular CFT <3 seconds b/l LE. Palpable DP/PT pulses b/l LE. Digital hair absent b/l. Skin temperature gradient WNL b/l. No pain with calf compression b/l. No edema noted b/l. No cyanosis or clubbing noted b/l LE. Varicosities present b/l. No cyanosis or clubbing noted.  Neurologic Normal speech. Oriented to person, place, and time. Protective sensation intact 5/5 intact bilaterally with 10g monofilament b/l. Vibratory sensation intact b/l.  Dermatologic Pedal integument with normal turgor, texture and tone b/l LE. No open wounds b/l. No interdigital macerations b/l. Toenails 1-5 b/l elongated, thickened, discolored with subungual debris. +Tenderness with  dorsal palpation of nailplates. Hyperkeratotic lesion(s) noted L 4th toe.  Orthopedic: Normal muscle strength 5/5 to all lower extremity muscle groups bilaterally. No pain, crepitus or joint limitation noted with ROM b/l LE. 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 Patient ambulates with rollator assistance.   Radiographs: None Assessment:   1. Pain due to onychomycosis of toenail   2. Callus   3. Factor V Leiden ([place])   4. Coagulation defect ([place])   5. Acquired hammertoes of both feet    Plan:  Patient was evaluated and treated and all questions answered. Consent given for treatment as described below: -Examined patient. -She is s/p hammertoe repair right 2nd digit. She is completely healed and chronic lesion has resolved. She is extremely pleased with results. She is now concerned left 4th digit will become a problem. We will incorporate padding for daily protection of toe for now. -Toenails 1-5 b/l were debrided in length and girth with sterile nail nippers and dremel without iatrogenic bleeding.  -Callus(es) L 4th toe pared utilizing sterile scalpel blade without complication or incident. Total number debrided =1. -Dispensed digital toe cap. Apply to L 4th toe every morning. Remove every evening. -Patient/POA to call should there be question/concern in the interim.  Return in about 3 months (around 04/13/2021).  Marzetta Board, DPM

## 2021-01-18 ENCOUNTER — Other Ambulatory Visit: Payer: Self-pay | Admitting: Family Medicine

## 2021-01-18 ENCOUNTER — Other Ambulatory Visit: Payer: Self-pay | Admitting: Emergency Medicine

## 2021-01-20 ENCOUNTER — Ambulatory Visit: Payer: Medicare Other | Admitting: Nurse Practitioner

## 2021-01-21 ENCOUNTER — Telehealth: Payer: Self-pay | Admitting: *Deleted

## 2021-01-21 ENCOUNTER — Telehealth: Payer: Medicare Other

## 2021-01-21 NOTE — Telephone Encounter (Signed)
  Care Management   Follow Up Note   01/21/2021 Name: Robin Harrell MRN: [id number] DOB: Sep 10, 1933   Referred by: [name], MD Reason for referral : Chronic Care Management (CHF, Pain)   An unsuccessful telephone outreach was attempted today. The patient was referred to the case management team for assistance with care management and care coordination.   Follow Up Plan: RNCM will seek assistance from [facility] Gu[facility] in rescheduling appointment within the next 30 days.  [name] RN, MSN RN Care Management Coordinator  [place] [phone] [email or web address]

## 2021-01-22 ENCOUNTER — Ambulatory Visit (INDEPENDENT_AMBULATORY_CARE_PROVIDER_SITE_OTHER): Payer: Medicare Other | Admitting: Family

## 2021-01-22 ENCOUNTER — Other Ambulatory Visit: Payer: Self-pay

## 2021-01-22 ENCOUNTER — Encounter: Payer: Self-pay | Admitting: Family

## 2021-01-22 VITALS — BP 110/70 | HR 92 | Temp 98.5°F | Wt 112.2 lb

## 2021-01-22 DIAGNOSIS — R350 Frequency of micturition: Secondary | ICD-10-CM | POA: Diagnosis not present

## 2021-01-22 LAB — POCT URINALYSIS DIPSTICK
Bilirubin, UA: NEGATIVE
Blood, UA: NEGATIVE
Glucose, UA: NEGATIVE
Ketones, UA: NEGATIVE
Leukocytes, UA: NEGATIVE
Nitrite, UA: NEGATIVE
Protein, UA: NEGATIVE
Spec Grav, UA: 1.02 (ref 1.010–1.025)
Urobilinogen, UA: 0.2 E.U./dL
pH, UA: 5.5 (ref 5.0–8.0)

## 2021-01-22 NOTE — Assessment & Plan Note (Signed)
Pt with a friend during visit -  reports sx for 2 days, denies any new meds or dose changes, also reports lower back pain, no other sx. UA neg, advised on continued water hydration, call back if worsening sx.

## 2021-01-22 NOTE — Patient Instructions (Signed)

## 2021-01-22 NOTE — Progress Notes (Signed)
Subjective:     Patient ID: Robin Harrell, female    DOB: [birth year], 87 y.o.   MRN: [id number]  Chief Complaint  Patient presents with   Back Pain   Urinary Frequency    Urinary symptoms: Patient c/o  dysuria, frequency, urgency, flank pain, pelvic pain, low back pain, foul odor, cloudy urine, hematuria. Other sx: vaginal d/c none, vaginal itching no. Duration of sx: 2 days; Home tx: none; Denies  nausea, fever. Reports last UTI unknown.    There are no preventive care reminders to display for this patient.  Past Medical History:  Diagnosis Date   Allergic rhinitis    Cough    Difficulty in swallowing    w/ occasional aspiration   DVT (deep venous thrombosis) (HCC)    Dyslipidemia    Factor V Leiden deficiency    lifelong coumadin   GERD (gastroesophageal reflux disease)    History of nuclear stress test 12/28/2007   lexiscan; low risk    HTN (hypertension)    PND (post-nasal drip)    Pneumonia due to COVID-19 virus 02/26/2019   02/25/2019-SARS-CoV-2-positive Hospitlized in [name]/2021 - [facility] -received IV steroids and remdesivir, did not appear to require mechanical ventilation   Pulmonary embolism ([place])    PVC's (premature ventricular contractions)    Thyroid disease     Past Surgical History:  Procedure Laterality Date   APPENDECTOMY     BUNIONECTOMY     CATARACT EXTRACTION W/ INTRAOCULAR LENS IMPLANT Right 11/02/2013   CHOLECYSTECTOMY  1973   ESOPHAGEAL DILATION     HAMMER TOE SURGERY     history of sleep study  12/27/2007   AHI during total sleep 0.9/hr and REM 1.5/hr; RDI during total sleep6.0/hr and REM 6.1/hr   REPLACEMENT TOTAL KNEE  01/11/2002   right   TRANSTHORACIC ECHOCARDIOGRAM  12/28/2007   borderline conc LVH with normal systolic function; MV mod thickened with mild MVP & mild MR    Outpatient Medications Prior to Visit  Medication Sig Dispense Refill   acetaminophen (TYLENOL) 500 MG tablet Take 1,000 mg by mouth every 6 (six) hours as needed for  moderate pain.     atorvastatin (LIPITOR) 10 MG tablet Take 1 tablet (10 mg total) by mouth daily at 6 PM. 90 tablet 1   benzonatate (TESSALON) 100 MG capsule TAKE 1 CAPSULE BY MOUTH 2 TIMES DAILY AS NEEDED FOR COUGH. 30 capsule 1   brimonidine (ALPHAGAN) 0.15 % ophthalmic solution Place 2 drops into both eyes 2 (two) times daily.   3   Calcium-Phosphorus-Vitamin D (CALCIUM/D3 ADULT GUMMIES) 200-96.6-200 MG-MG-UNIT CHEW Chew 2 tablets by mouth daily.     fluticasone (FLONASE) 50 MCG/ACT nasal spray SPRAY 2 SPRAYS INTO EACH NOSTRIL EVERY DAY 48 mL 1   ipratropium (ATROVENT) 0.03 % nasal spray PLACE 2 SPRAYS INTO BOTH NOSTRILS 3 (THREE) TIMES DAILY AS NEEDED FOR RHINITIS. 90 mL 2   latanoprost (XALATAN) 0.005 % ophthalmic solution Place 1 drop into both eyes at bedtime.      levocetirizine (XYZAL) 5 MG tablet Take 1 tablet (5 mg total) by mouth every evening. 30 tablet 11   levothyroxine (SYNTHROID) 25 MCG tablet TAKE 1 TABLET BY MOUTH DAILY BEFORE BREAKFAST. 90 tablet 3   losartan (COZAAR) 50 MG tablet Take 1 tablet (50 mg total) by mouth daily. 90 tablet 3   metoprolol tartrate (LOPRESSOR) 25 MG tablet Take 1 tablet (25 mg total) by mouth 2 (two) times daily. 180 tablet 3   multivitamin-iron-minerals-folic  acid (CENTRUM) chewable tablet Chew 1 tablet by mouth daily.     vitamin E 1000 UNIT capsule Take 1,000 Units by mouth daily.     warfarin (COUMADIN) 7.5 MG tablet TAKE 1/2 TO 1 TABLET BY MOUTH DAILY AS DIRECTED (Patient taking differently: TAKE 1/2 TO 1 TABLET BY M-W-F AS DIRECTED) 90 tablet 1   No facility-administered medications prior to visit.    Allergies  Allergen Reactions   Azithromycin Nausea Only   Codeine Nausea Only   Erythromycin Nausea And Vomiting        Objective:    Physical Exam Vitals and nursing note reviewed.  Constitutional:      Appearance: Normal appearance.  Cardiovascular:     Rate and Rhythm: Normal rate and regular rhythm.  Pulmonary:     Effort:  Pulmonary effort is normal.     Breath sounds: Normal breath sounds.  Musculoskeletal:        General: Normal range of motion.  Skin:    General: Skin is warm and dry.  Neurological:     Mental Status: She is alert.  Psychiatric:        Mood and Affect: Mood normal.        Behavior: Behavior normal.    BP 110/70 (BP Location: Right Arm, Patient Position: Sitting, Cuff Size: Normal)   Pulse 92   Temp 98.5 F (36.9 C) (Temporal)   Wt 112 lb 3.2 oz (50.9 kg)   LMP  (LMP Unknown)   SpO2 91%   BMI 19.26 kg/m  Wt Readings from Last 3 Encounters:  01/22/21 112 lb 3.2 oz (50.9 kg)  11/04/20 113 lb 9.6 oz (51.5 kg)  10/14/20 113 lb 9.6 oz (51.5 kg)       Assessment & Plan:   Problem List Items Addressed This Visit       Other   Urinary frequency - Primary    Pt with a friend during visit -  reports sx for 2 days, denies any new meds or dose changes, also reports lower back pain, no other sx. UA neg, advised on continued water hydration, call back if worsening sx.      Relevant Orders   POCT Urinalysis Dipstick (Completed)    No orders of the defined types were placed in this encounter.

## 2021-01-25 DIAGNOSIS — H401132 Primary open-angle glaucoma, bilateral, moderate stage: Secondary | ICD-10-CM | POA: Diagnosis not present

## 2021-01-28 DIAGNOSIS — Z20822 Contact with and (suspected) exposure to covid-19: Secondary | ICD-10-CM | POA: Diagnosis not present

## 2021-02-08 ENCOUNTER — Ambulatory Visit (INDEPENDENT_AMBULATORY_CARE_PROVIDER_SITE_OTHER): Payer: Medicare Other

## 2021-02-08 ENCOUNTER — Other Ambulatory Visit: Payer: Self-pay

## 2021-02-08 DIAGNOSIS — Z7901 Long term (current) use of anticoagulants: Secondary | ICD-10-CM | POA: Diagnosis not present

## 2021-02-08 LAB — POCT INR: INR: 2.1 (ref 2.0–3.0)

## 2021-02-08 NOTE — Patient Instructions (Signed)
Continue to take warfarin 1 tablet daily excpet for 1/2 a tablet on Monday, Wednesday and Fridays. Recheck INR in 6 weeks.  [phone]

## 2021-02-11 ENCOUNTER — Telehealth: Payer: Self-pay

## 2021-02-11 NOTE — Chronic Care Management (AMB) (Signed)
°  Care Management   Note  02/11/2021 Name: Robin Harrell MRN: [id number] DOB: 09-[name]-1934  [name] is a 87 y.o. year old female who is a primary care patient of [name], MD and is actively engaged with the care management team. I reached out to [employer] by phone today to assist with re-scheduling a follow up visit with the RN Case Manager  Follow up plan: Unsuccessful telephone outreach attempt made. A HIPAA compliant phone message was left for the patient providing contact information and requesting a return call.  The care management team will reach out to the patient again over the next 7 days.  If patient returns call to provider office, please advise to call [facility]  at [place], [place], [place], [place] [address] Direct Dial: [phone] [email or web address]

## 2021-02-22 DIAGNOSIS — Z20822 Contact with and (suspected) exposure to covid-19: Secondary | ICD-10-CM | POA: Diagnosis not present

## 2021-02-25 ENCOUNTER — Other Ambulatory Visit: Payer: Self-pay | Admitting: Family Medicine

## 2021-02-26 ENCOUNTER — Ambulatory Visit: Payer: Medicare Other | Admitting: Family Medicine

## 2021-02-26 NOTE — Chronic Care Management (AMB) (Signed)
°  Care Management   Note  02/26/2021 Name: NIA NATHANIEL MRN: [id number] DOB: 1933/05/04  Merilyn Baba Townshend is a 88 y.o. year old female who is a primary care patient of [name], MD and is actively engaged with the care management team. I reached out to [employer] by phone today to assist with re-scheduling a follow up visit with the RN Case Manager  Follow up plan: Telephone appointment with care management team member scheduled for:03/18/2021  Noreene Larsson, [place], [facility]  [place], [place] [address] Direct Dial: [phone] [email or web address]

## 2021-03-01 DIAGNOSIS — Z20822 Contact with and (suspected) exposure to covid-19: Secondary | ICD-10-CM | POA: Diagnosis not present

## 2021-03-03 ENCOUNTER — Telehealth: Payer: Self-pay | Admitting: Family Medicine

## 2021-03-03 NOTE — Telephone Encounter (Signed)
Pt called and states she is currently out of Warfarin 7.5 mg tablets. She needs to have a refill called in.

## 2021-03-04 NOTE — Telephone Encounter (Signed)
LVM for patient to return call. 

## 2021-03-04 NOTE — Telephone Encounter (Signed)
Patient needs to call cardiology for this medication.

## 2021-03-04 NOTE — Telephone Encounter (Signed)
Gave patient message below.

## 2021-03-15 DIAGNOSIS — Z20822 Contact with and (suspected) exposure to covid-19: Secondary | ICD-10-CM | POA: Diagnosis not present

## 2021-03-18 ENCOUNTER — Telehealth: Payer: Self-pay | Admitting: Emergency Medicine

## 2021-03-18 ENCOUNTER — Ambulatory Visit (INDEPENDENT_AMBULATORY_CARE_PROVIDER_SITE_OTHER): Payer: Medicare Other | Admitting: *Deleted

## 2021-03-18 DIAGNOSIS — R600 Localized edema: Secondary | ICD-10-CM

## 2021-03-18 DIAGNOSIS — R059 Cough, unspecified: Secondary | ICD-10-CM

## 2021-03-18 DIAGNOSIS — I5189 Other ill-defined heart diseases: Secondary | ICD-10-CM

## 2021-03-18 DIAGNOSIS — I10 Essential (primary) hypertension: Secondary | ICD-10-CM

## 2021-03-18 NOTE — Telephone Encounter (Signed)
Called and spoke with pt's daughter [name] as well as pt who states that pt is coughing at night and it is keeping her up. Pt is coughing up clear phlegm.  Pt denies any complaints of wheezing.  Pt said that she was taking loratadine that was mentioned by RB which did not seem to help and was told by PCP to try levocetirizine which she has been taking. Pt also has been using benzonatate twice a day and if needed, pt will use a netti pot.  Stated to pt that we needed to get her in for an appt and she verbalized understanding. Appt scheduled for pt tomorrow 1/27 with BW at 11:30. Nothing further needed.

## 2021-03-18 NOTE — Chronic Care Management (AMB) (Signed)
Chronic Care Management   CCM RN Visit Note  03/18/2021 Name: Robin Harrell MRN: [id number] DOB: 07-09-1933  Subjective: Robin Harrell is a 88 y.o. year old female who is a primary care patient of [name], MD. The care management team was consulted for assistance with disease management and care coordination needs.    Engaged with patient by telephone for follow up visit in response to provider referral for case management and/or care coordination services.   Consent to Services:  The patient was given information about Chronic Care Management services, agreed to services, and gave verbal consent prior to initiation of services.  Please see initial visit note for detailed documentation.   Patient agreed to services and verbal consent obtained.   Assessment: Review of patient past medical history, allergies, medications, health status, including review of consultants reports, laboratory and other test data, was performed as part of comprehensive evaluation and provision of chronic care management services.   SDOH (Social Determinants of Health) assessments and interventions performed:    CCM Care Plan  Allergies  Allergen Reactions   Azithromycin Nausea Only   Codeine Nausea Only   Erythromycin Nausea And Vomiting    Outpatient Encounter Medications as of 03/18/2021  Medication Sig   acetaminophen (TYLENOL) 500 MG tablet Take 1,000 mg by mouth every 6 (six) hours as needed for moderate pain.   atorvastatin (LIPITOR) 10 MG tablet Take 1 tablet (10 mg total) by mouth daily at 6 PM.   benzonatate (TESSALON) 100 MG capsule TAKE 1 CAPSULE BY MOUTH 2 TIMES DAILY AS NEEDED FOR COUGH   brimonidine (ALPHAGAN) 0.15 % ophthalmic solution Place 2 drops into both eyes 2 (two) times daily.    Calcium-Phosphorus-Vitamin D (CALCIUM/D3 ADULT GUMMIES) 200-96.6-200 MG-MG-UNIT CHEW Chew 2 tablets by mouth daily.   fluticasone (FLONASE) 50 MCG/ACT nasal spray SPRAY 2 SPRAYS INTO EACH  NOSTRIL EVERY DAY   ipratropium (ATROVENT) 0.03 % nasal spray PLACE 2 SPRAYS INTO BOTH NOSTRILS 3 (THREE) TIMES DAILY AS NEEDED FOR RHINITIS.   latanoprost (XALATAN) 0.005 % ophthalmic solution Place 1 drop into both eyes at bedtime.    levocetirizine (XYZAL) 5 MG tablet Take 1 tablet (5 mg total) by mouth every evening.   levothyroxine (SYNTHROID) 25 MCG tablet TAKE 1 TABLET BY MOUTH DAILY BEFORE BREAKFAST.   losartan (COZAAR) 50 MG tablet Take 1 tablet (50 mg total) by mouth daily.   metoprolol tartrate (LOPRESSOR) 25 MG tablet Take 1 tablet (25 mg total) by mouth 2 (two) times daily.   multivitamin-iron-minerals-folic acid (CENTRUM) chewable tablet Chew 1 tablet by mouth daily.   vitamin E 1000 UNIT capsule Take 1,000 Units by mouth daily.   warfarin (COUMADIN) 7.5 MG tablet TAKE 1/2 TO 1 TABLET BY MOUTH DAILY AS DIRECTED (Patient taking differently: TAKE 1/2 TO 1 TABLET BY M-W-F AS DIRECTED)   No facility-administered encounter medications on file as of 03/18/2021.    Patient Active Problem List   Diagnosis Date Noted   Urinary frequency 01/22/2021   Traumatic coccydynia 11/04/2020   Primary osteoarthritis of both knees 11/18/2019   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 01/29/2019   Chronic venous stasis dermatitis of both lower extremities 12/31/2018   Aortic stenosis, mild 02/07/2017   History of recurrent UTIs 01/26/2017   Chronic idiopathic thrombocytopenia ([facility]) 04/14/2016   Essential tremor 09/03/2015   History of respiratory failure 11/11/2014   Hypothyroidism (acquired) 09/02/2014   Edema of right lower extremity 02/01/2013   Hyperlipidemia 11/19/2012   Colon polyp  10/15/2012   Long term (current) use of anticoagulants 05/10/2012   Insomnia 10/11/2011   Osteoarthritis, multiple sites 97/67/3419   Diastolic dysfunction 37/90/2409   Factor V deficiency ([facility]) 02/09/2010   Osteoporosis 02/09/2010   DJD (degenerative joint disease), lumbar 10/14/2009   Glaucoma 01/29/2008    Essential hypertension 11/15/2007   History of DVT (deep vein thrombosis) 11/15/2007   Seasonal and perennial allergic rhinitis 11/15/2007   GERD 11/15/2007   COUGH, CHRONIC 11/15/2007    Conditions to be addressed/monitored:CHF and COUGH  Care Plan : RNCM COUGH/Heart Failure (Adult)  Updates made by Leona Singleton, RN since 03/18/2021 12:00 AM     Problem: Symptom Exacerbation (Heart Failure/Cough)   Priority: Medium     Long-Range Goal: Symptom Exacerbation Prevented or Minimized   Start Date: 03/18/2021  Expected End Date: 03/18/2022  Recent Progress: On track  Priority: Medium  Note:   Current Barriers:  Knowledge deficit related to basic heart failure pathophysiology and self care management of cough/allergies;  patient with history of diastolic dysfunction and chronic lower extremity edema and allergies with chronic cough.  States she does weigh herself daily.  Weight this morning was 112.4 pounds.  Denies any shortness of breath or swelling; Does endorse bad coughing spells over the past few months.  Was prescribed Xyzol and [employer] but continues to have long spells.  Patient encouraged to schedule pulmonology appointment to discuss cough and change in medications.  Daughters are assisting patient  and husband (recently had 2 strokes and is blind).  Daughter is assisting with prepackage mail order medications (offered CCM Pharmacist referral-decline at this time) Lacks reliable transportation; patient and spouse are no longer able to drive and are dependent upon friends for transportation to medical appointments; no family in town; Care guide referral placed for transportation resources Case Manager Clinical Goal(s):  patient will weigh self daily and record patient will verbalize understanding of Heart Failure Action Plan and when to call doctor patient will weigh daily and record (notifying MD of 3 lb weight gain over night or 5 lb in a week)   COUGH/COPD: (Status:  New goal.) Long Term Goal  Reviewed medications with patient, including use of prescribed maintenance and rescue inhalers, and provided instruction on medication management and the importance of adherence Advised patient to track and manage COPD triggers Provided written and verbal instructions on pursed lip breathing and utilized returned demonstration as teach back Advised patient to self assesses COPD action plan zone and make appointment with provider if in the yellow zone for 48 hours without improvement Discussed the importance of adequate rest and management of fatigue with COPD    Heart Failure Interventions:  (Status: Goal on Track (progressing): YES.)  Long Term Goal  Wt Readings from Last 3 Encounters:  01/22/21 112 lb 3.2 oz (50.9 kg)  11/04/20 113 lb 9.6 oz (51.5 kg)  10/14/20 113 lb 9.6 oz (51.5 kg)  Basic overview and discussion of pathophysiology of Heart Failure reviewed Provided education on low sodium diet Reviewed Heart Failure Action Plan in depth and provided written copy Discussed importance of daily weight and advised patient to weigh and record daily Reviewed role of diuretics in prevention of fluid overload and management of heart failure Discussed the importance of keeping all appointments with provider  Collaboration with [name], MD regarding development and update of comprehensive plan of care as evidenced by provider attestation and co-signature Inter-disciplinary care team collaboration (see longitudinal plan of care) Discussed when to  call provider based on weight. Barriers to lifestyle changes reviewed and addressed and barriers to treatment reviewed and addressed Healthy lifestyle promoted Medication-adherence assessment completed Self-awareness of signs/symptoms of worsening disease encouraged Encouraged to attend all scheduled provider appointments Encouraged to call provider office for new concerns or questions Patient Goals/Self-Care  Activities: Call office if I gain more than 2 pounds in one day or 5 pounds in one week Keep legs up while sitting Use salt in moderation Watch for swelling in feet, ankles and legs every day Weigh myself daily and record in log, taking log for provider review Schedule appointment with pulmonology regarding coughing Follow Up Plan: The care management team will reach out to the patient again over the next 45 business days.     Plan:The care management team will reach out to the patient again over the next 45 days.  [name] RN, MSN RN Care Management Coordinator  [place] [phone] [email or web address]

## 2021-03-18 NOTE — Patient Instructions (Signed)
Visit Information  Thank you for taking time to visit with me today. Please don't hesitate to contact me if I can be of assistance to you before our next scheduled telephone appointment.  Following are the goals we discussed today:  Call office if I gain more than 2 pounds in one day or 5 pounds in one week Keep legs up while sitting Use salt in moderation Watch for swelling in feet, ankles and legs every day Weigh myself daily and record in log, taking log for provider review Schedule appointment with pulmonology regarding coughing  Our next appointment is by telephone on 3/9 at 1400  Please call the care guide team at [phone] if you need to cancel or reschedule your appointment.   If you are experiencing a Mental Health or [facility] or need someone to talk to, please call the Suicide and Crisis Lifeline: 988 call the [place] National Suicide Prevention Lifeline: 6611835106 or TTY: (403)058-0989 TTY 854-644-1269) to talk to a trained counselor call 1-800-273-TALK (toll free, 24 hour hotline) go to [facility] [address] [place] [phone]) call 911   Patient verbalizes understanding of instructions and care plan provided today and agrees to view in [facility]. Active MyChart status confirmed with patient.    [name] RN, MSN RN Care Management Coordinator  [place] [phone] [email or web address]

## 2021-03-19 ENCOUNTER — Ambulatory Visit (INDEPENDENT_AMBULATORY_CARE_PROVIDER_SITE_OTHER): Payer: Medicare Other | Admitting: Nurse Practitioner

## 2021-03-19 ENCOUNTER — Ambulatory Visit (INDEPENDENT_AMBULATORY_CARE_PROVIDER_SITE_OTHER): Payer: Medicare Other

## 2021-03-19 ENCOUNTER — Other Ambulatory Visit: Payer: Self-pay

## 2021-03-19 ENCOUNTER — Encounter: Payer: Self-pay | Admitting: Nurse Practitioner

## 2021-03-19 VITALS — BP 110/70 | HR 75 | Temp 97.5°F | Ht 64.0 in | Wt 115.6 lb

## 2021-03-19 DIAGNOSIS — J00 Acute nasopharyngitis [common cold]: Secondary | ICD-10-CM | POA: Diagnosis not present

## 2021-03-19 DIAGNOSIS — J3089 Other allergic rhinitis: Secondary | ICD-10-CM | POA: Diagnosis not present

## 2021-03-19 DIAGNOSIS — I700 Atherosclerosis of aorta: Secondary | ICD-10-CM | POA: Diagnosis not present

## 2021-03-19 DIAGNOSIS — J40 Bronchitis, not specified as acute or chronic: Secondary | ICD-10-CM | POA: Diagnosis not present

## 2021-03-19 DIAGNOSIS — R053 Chronic cough: Secondary | ICD-10-CM | POA: Diagnosis not present

## 2021-03-19 DIAGNOSIS — J90 Pleural effusion, not elsewhere classified: Secondary | ICD-10-CM

## 2021-03-19 DIAGNOSIS — R059 Cough, unspecified: Secondary | ICD-10-CM | POA: Diagnosis not present

## 2021-03-19 DIAGNOSIS — J302 Other seasonal allergic rhinitis: Secondary | ICD-10-CM | POA: Diagnosis not present

## 2021-03-19 DIAGNOSIS — M47816 Spondylosis without myelopathy or radiculopathy, lumbar region: Secondary | ICD-10-CM | POA: Diagnosis not present

## 2021-03-19 MED ORDER — AZELASTINE HCL 0.1 % NA SOLN: 2.0000 | Freq: Two times a day (BID) | NASAL | 3 refills | Status: DC

## 2021-03-19 MED ORDER — PREDNISONE 20 MG PO TABS: 20.0000 mg | ORAL_TABLET | Freq: Every day | ORAL | 0 refills | Status: AC

## 2021-03-19 MED ORDER — AMOXICILLIN 875 MG PO TABS: 875.0000 mg | ORAL_TABLET | Freq: Two times a day (BID) | ORAL | 0 refills | Status: AC

## 2021-03-19 MED ORDER — PROMETHAZINE-DM 6.25-15 MG/5ML PO SYRP: 2.5000 mL | ORAL_SOLUTION | Freq: Four times a day (QID) | ORAL | 0 refills | Status: DC | PRN

## 2021-03-19 NOTE — Progress Notes (Addendum)
@Patient  ID: [name], female    DOB: [birth year], 88 y.o.   MRN: [id number]  Chief Complaint  Patient presents with   Follow-up    She reports that she has has a productive cough that is clear and thick and she is having some nasal drainage that has blood tinge to it as well.     Referring provider: [name], MD  HPI: 88 year old female, former smoker followed for chronic cough d/t rhinitis, hx of COVID pneumonitis now with chronic hypoxic respiratory failure, GERD with hiatal hernia, and intermittent aspiration/choking with some associated hoarseness. She is a patient of Dr. Agustina Caroli and last seen in office on 06/08/2020 by Elie Confer, NP. Past medical history significant for chronic thromboembolic disease and PE with factor V Leiden deficiency on chronic anticoagulation therapy, HTN, aortic stenosis, hypothyroidism, DJD, HLD.  TEST/EVENTS:  05/30/2019 CT chest w/o contrast: atheroscerlosis. 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 Minimal biapical scarring. Subsegmental atelectasis at bases of lingula and RML and RLL. Tracheobronchial calcifications.  05/07/2020 echocardiogram: Moderate AS and MS. Moderate MR. EF 60-65%. Mild LVH. Moderately elevated PA pressure. LA severely dilated. Tricuspid valve regurg moderate.  06/08/2020 CXR 2 view: borderline enlargement of cardiac silhouette. Minimal blunting of L lateral costophrenic angle, questionable for tiny pleural effusion. Lungs otherwise clear.   06/08/2020: [facility] [place] with Elie Confer, NP. Acute visit for worsening productive cough. Switched from Zyrtec to claritin. CXR with questionable tiny left pleural effusion; otherwise unremarkable. CBC without leukocytosis.   03/19/2021: Today - acute visit Patient presents today with daughter, [name], for increased cough and nasal congestion. Her nasal congestion worsened around 2 weeks ago and has gotten thicker and more purulent. She has noticed some blood tinged mucous but she has been blowing  her nose frequently. No epistaxis or hemoptysis. Her cough increased around a week ago and has been keeping her up at night. She describes it as productive with clear sputum. She is feeling a little more fatigued than normal due to only getting around 2 hours of sleep at night. She denies shortness of breath, wheezing, orthopnea, chest pain, hemoptysis or lower extremity swelling. She did have a questionable tiny left pleural on her CXR in April. Her PCP recently switched her from claritin to Xyzal, which she hasn't noticed a significant change with. She continues on flonase daily and atrovent Twice daily. She is using tessalon perles for cough with minimal relief.   Allergies  Allergen Reactions   Azithromycin Nausea Only   Codeine Nausea Only   Erythromycin Nausea And Vomiting    Immunization History  Administered Date(s) Administered   Fluad Quad(high Dose 65+) 10/17/2018, 11/18/2019, 11/04/2020   Influenza Split 11/21/2008, 02/09/2010, 10/24/2011, 11/21/2012   Influenza Whole 11/27/2008   Influenza, High Dose Seasonal PF 11/12/2011, 11/29/2012, 11/05/2013, 01/05/2015, 11/16/2015, 10/21/2016, 11/09/2017   Influenza,inj,Quad PF,6+ Mos 11/23/2015   Influenza-Unspecified 02/02/2011   PFIZER(Purple Top)SARS-COV-2 Vaccination 06/03/2019, 06/17/2019   Pfizer Covid-19 Vaccine Bivalent Booster 35yrs & up 12/25/2020   Pneumococcal Conjugate-13 04/23/2013   Pneumococcal Polysaccharide-23 02/21/2005   Tdap 09/22/2007   Zoster Recombinat (Shingrix) 07/11/2016, 01/27/2017   Zoster, Live 04/22/2010    Past Medical History:  Diagnosis Date   Allergic rhinitis    Cough    Difficulty in swallowing    w/ occasional aspiration   DVT (deep venous thrombosis) (HCC)    Dyslipidemia    Factor V Leiden deficiency    lifelong coumadin   GERD (gastroesophageal reflux disease)    History  of nuclear stress test 12/28/2007   lexiscan; low risk    HTN (hypertension)    PND (post-nasal drip)    Pneumonia  due to COVID-19 virus 02/26/2019   02/25/2019-SARS-CoV-2-positive Hospitlized in [name]/2021 - [place] -received IV steroids and remdesivir, did not appear to require mechanical ventilation   Pulmonary embolism (HCC)    PVC's (premature ventricular contractions)    Thyroid disease     Tobacco History: Social History   Tobacco Use  Smoking Status Former   Years: 1.00   Types: Cigarettes   Quit date: 02/21/1958   Years since quitting: 63.1  Smokeless Tobacco Never   Counseling given: Not Answered   Outpatient Medications Prior to Visit  Medication Sig Dispense Refill   acetaminophen (TYLENOL) 500 MG tablet Take 1,000 mg by mouth every 6 (six) hours as needed for moderate pain.     atorvastatin (LIPITOR) 10 MG tablet Take 1 tablet (10 mg total) by mouth daily at 6 PM. 90 tablet 1   benzonatate (TESSALON) 100 MG capsule TAKE 1 CAPSULE BY MOUTH 2 TIMES DAILY AS NEEDED FOR COUGH 30 capsule 1   brimonidine (ALPHAGAN) 0.15 % ophthalmic solution Place 2 drops into both eyes 2 (two) times daily.   3   Calcium-Phosphorus-Vitamin D (CALCIUM/D3 ADULT GUMMIES) 200-96.6-200 MG-MG-UNIT CHEW Chew 2 tablets by mouth daily.     fluticasone (FLONASE) 50 MCG/ACT nasal spray SPRAY 2 SPRAYS INTO EACH NOSTRIL EVERY DAY 48 mL 1   ipratropium (ATROVENT) 0.03 % nasal spray PLACE 2 SPRAYS INTO BOTH NOSTRILS 3 (THREE) TIMES DAILY AS NEEDED FOR RHINITIS. 90 mL 2   latanoprost (XALATAN) 0.005 % ophthalmic solution Place 1 drop into both eyes at bedtime.      levocetirizine (XYZAL) 5 MG tablet Take 1 tablet (5 mg total) by mouth every evening. 30 tablet 11   levothyroxine (SYNTHROID) 25 MCG tablet TAKE 1 TABLET BY MOUTH DAILY BEFORE BREAKFAST. 90 tablet 3   losartan (COZAAR) 50 MG tablet Take 1 tablet (50 mg total) by mouth daily. 90 tablet 3   metoprolol tartrate (LOPRESSOR) 25 MG tablet Take 1 tablet (25 mg total) by mouth 2 (two) times daily. 180 tablet 3   multivitamin-iron-minerals-folic acid (CENTRUM) chewable tablet  Chew 1 tablet by mouth daily.     vitamin E 1000 UNIT capsule Take 1,000 Units by mouth daily.     warfarin (COUMADIN) 7.5 MG tablet TAKE 1/2 TO 1 TABLET BY MOUTH DAILY AS DIRECTED (Patient taking differently: TAKE 1/2 TO 1 TABLET BY M-W-F AS DIRECTED) 90 tablet 1   No facility-administered medications prior to visit.     Review of Systems:   Constitutional: No weight loss or gain, night sweats, fevers, chills. +fatigue HEENT: No headaches, difficulty swallowing, tooth/dental problems, or sore throat. No sneezing, itching, ear ache. +nasal congestion, purulent rhinorrhea; post nasal drip CV:  No chest pain, orthopnea, PND, swelling in lower extremities, anasarca, dizziness, palpitations, syncope Resp: +productive cough, clear sputum, worse at night. No shortness of breath with exertion or at rest.  No hemoptysis. No wheezing.  No chest wall deformity GI:  No heartburn, indigestion, abdominal pain, nausea, vomiting, diarrhea, change in bowel habits, loss of appetite, bloody stools.  GU: No dysuria, change in color of urine, urgency or frequency.  No flank pain, no hematuria  Skin: No rash, lesions, ulcerations MSK:  No joint pain or swelling.  No decreased range of motion.  No back pain. Neuro: No dizziness or lightheadedness.  Psych: No depression or anxiety.  Mood stable.     Physical Exam:  BP 110/70 (BP Location: Left Arm, Patient Position: Sitting, Cuff Size: Normal)    Pulse 75    Temp (!) 97.5 F (36.4 C) (Oral)    Ht 5\' 4"  (1.626 m)    Wt 115 lb 9.6 oz (52.4 kg)    LMP  (LMP Unknown)    SpO2 96%    BMI 19.84 kg/m   GEN: Pleasant, interactive, well-nourished; in no acute distress. HEENT:  Normocephalic and atraumatic. EACs patent bilaterally. TM pearly gray with present light reflex bilaterally. PERRLA. Sclera white. Nasal turbinates pink, moist and patent bilaterally. Clear rhinorrhea present. Oropharynx erythematous and moist, without exudate or edema. No lesions,  ulcerations NECK:  Supple w/ fair ROM. No JVD present. Normal carotid impulses w/o bruits. Thyroid symmetrical with no goiter or nodules palpated. No lymphadenopathy.   CV: RRR, systolic murmur, no peripheral edema. Pulses intact, +2 bilaterally. No cyanosis, pallor or clubbing. PULMONARY:  Unlabored, regular breathing. Clear bilaterally A&P w/o wheezes/rales/rhonchi. No accessory muscle use. No dullness to percussion. GI: BS present and normoactive. Soft, non-tender to palpation. No organomegaly or masses detected. No CVA tenderness. MSK: No erythema, warmth or tenderness. Cap refil <2 sec all extrem. No deformities or joint swelling noted.  Neuro: A/Ox3. No focal deficits noted.   Skin: Warm, no lesions or rashe Psych: Normal affect and behavior. Judgement and thought content appropriate.     Lab Results:  CBC    Component Value Date/Time   WBC 4.8 11/09/2020 1105   RBC 3.82 (L) 11/09/2020 1105   HGB 12.2 11/09/2020 1105   HCT 36.4 11/09/2020 1105   PLT 136.0 (L) 11/09/2020 1105   MCV 95.2 11/09/2020 1105   MCH 30.9 11/18/2019 1605   MCHC 33.5 11/09/2020 1105   RDW 13.3 11/09/2020 1105   LYMPHSABS 1.0 11/09/2020 1105   MONOABS 0.5 11/09/2020 1105   EOSABS 0.2 11/09/2020 1105   BASOSABS 0.0 11/09/2020 1105    BMET    Component Value Date/Time   NA 141 11/09/2020 1105   K 4.6 11/09/2020 1105   CL 104 11/09/2020 1105   CO2 28 11/09/2020 1105   GLUCOSE 89 11/09/2020 1105   BUN 12 11/09/2020 1105   CREATININE 0.89 11/09/2020 1105   CREATININE 1.05 (H) 11/18/2019 1601   CALCIUM 9.1 11/09/2020 1105   GFRNONAA 49 (L) 11/18/2019 1601   GFRAA 57 (L) 11/18/2019 1601    BNP    Component Value Date/Time   BNP 263.5 (H) 03/02/2019 0039     Imaging:  03/19/2021: CXR reviewed by me. Mild enlargement of cardiopericardial silhouette. Chronic blunting of left lateral costophrenic angle, unchanged. Atherosclerosis. Airway thickening - possible bronchitis vs reactive airway  disease.   DG Chest 2 View  Result Date: 03/19/2021 CLINICAL DATA:  Left pleural effusion.  Increase cough. EXAM: CHEST - 2 VIEW COMPARISON:  06/08/2020 FINDINGS: Reverse lordotic projection with the patient substantially hunched over to the left on the frontal view attempts. Bony demineralization. Mild enlargement of the cardiopericardial silhouette. Chronic blunting of the left lateral costophrenic angle. Airway thickening is present, suggesting bronchitis or reactive airways disease. Atherosclerotic calcification of the aortic arch. Thoracic kyphosis and spondylosis along with lumbar spondylosis. Substantial dextroconvex scoliosis at the thoracolumbar junction. IMPRESSION: 1. Airway thickening is present, suggesting bronchitis or reactive airways disease. 2. Stable mild enlargement of the cardiopericardial silhouette, without overt edema. 3. Chronic blunting of the left lateral costophrenic angle potentially from scarring or trace pleural effusion. 4.  Aortic Atherosclerosis (ICD10-I70.0). Electronically Signed   By: Van Clines M.D.   On: 03/19/2021 12:51      No flowsheet data found.  No results found for: NITRICOXIDE      Assessment & Plan:   Bronchitis Bronchitic changes on x ray. Unchanged blunting of left costophrenic angle. Prednisone 20 mg for 5 days. Suspect cough multifactorial related to worsening postnasal drip/URI. No other respiratory symptoms. Promethazine-DM for cough suppression; advised to primarily take at night and use with caution. Ask for assistance when ambulating if drowsiness occurs.   Patient Instructions  -Continue tessalon perles (benzonatate) 1 capsule every 8 hours as needed for cough -Continue Flonase nasal spray 2 sprays each nostril daily -Continue Xyzal 5 mg daily -Continue Atrovent nasal spray 2 sprays each nostril up to three times a day as needed for runny nose/congestion/post nasal drip  -Astelin nasal spray 2 sprays each nostril Twice daily   -Saline nasal irrigations Twice daily  -Prednisone 20 mg for 5 days. Take in AM with food. -Promethazine-DM cough syrup 2.5 mL every 6 hours as needed for cough. May cause drowsiness. -Amoxicillin 875 mg Twice daily. Notify immediately of any rash, itching, hives, or swelling, or seek emergency care.Finish your antibiotics in their entirety. Do not stop just because symptoms improve. Take with food to reduce GI upset.  Referral to ENT  Follow up in one month with Dr. Lamonte Sakai or Joellen Jersey Andrea Ferrer,NP. If symptoms do not improve or worsen, please contact office for sooner follow up or seek emergency care.    Seasonal and perennial allergic rhinitis Persistent symptoms. Worsened over the past two weeks. Will tx for possible bacterial coinfection with amoxicillin Twice daily for 7 days. Add Astelin nasal spray Twice daily. Saline irrigations 1-2 times a day. Post nasal drip contributing to cough. Avoid blowing nose and notify if epistaxis occurs. Referral placed to ENT d/t chronic symptoms despite optimal therapy.    Clayton Bibles, NP 03/19/2021  Pt aware and understands NP's role.

## 2021-03-19 NOTE — Assessment & Plan Note (Addendum)
Persistent symptoms. Worsened over the past two weeks. Will tx for possible bacterial coinfection with amoxicillin Twice daily for 7 days. Add Astelin nasal spray Twice daily. Saline irrigations 1-2 times a day. Post nasal drip contributing to cough. Avoid blowing nose and notify if epistaxis occurs. Referral placed to ENT d/t chronic symptoms despite optimal therapy.

## 2021-03-19 NOTE — Assessment & Plan Note (Addendum)
Bronchitic changes on x ray. Unchanged blunting of left costophrenic angle. Prednisone 20 mg for 5 days. Suspect cough multifactorial related to worsening postnasal drip/URI. No other respiratory symptoms. Promethazine-DM for cough suppression; advised to primarily take at night and use with caution. Ask for assistance when ambulating if drowsiness occurs.   Patient Instructions  -Continue tessalon perles (benzonatate) 1 capsule every 8 hours as needed for cough -Continue Flonase nasal spray 2 sprays each nostril daily -Continue Xyzal 5 mg daily -Continue Atrovent nasal spray 2 sprays each nostril up to three times a day as needed for runny nose/congestion/post nasal drip  -Astelin nasal spray 2 sprays each nostril Twice daily  -Saline nasal irrigations Twice daily  -Prednisone 20 mg for 5 days. Take in AM with food. -Promethazine-DM cough syrup 2.5 mL every 6 hours as needed for cough. May cause drowsiness. -Amoxicillin 875 mg Twice daily. Notify immediately of any rash, itching, hives, or swelling, or seek emergency care.Finish your antibiotics in their entirety. Do not stop just because symptoms improve. Take with food to reduce GI upset.  Referral to ENT  Follow up in one month with Dr. Lamonte Sakai or Joellen Jersey Emmalie Haigh,NP. If symptoms do not improve or worsen, please contact office for sooner follow up or seek emergency care.

## 2021-03-19 NOTE — Telephone Encounter (Signed)
Katie, This message was received this afternoon for you.  Just wanted to check in with you.   The nasal spray was around $150 and mom didnt want to get it.   I told her ok I would email you and see if there was something else she could take or if you thought ok to skip it.    If you feel she really needs to take it I am sure I can get her to do it.    Just wanted your input   Thanks!   [name] [phone]

## 2021-03-19 NOTE — Patient Instructions (Addendum)
-  Continue tessalon perles (benzonatate) 1 capsule every 8 hours as needed for cough -Continue Flonase nasal spray 2 sprays each nostril daily -Continue Xyzal 5 mg daily -Continue Atrovent nasal spray 2 sprays each nostril up to three times a day as needed for runny nose/congestion/post nasal drip  -Astelin nasal spray 2 sprays each nostril Twice daily  -Saline nasal irrigations Twice daily  -Prednisone 20 mg for 5 days. Take in AM with food. -Promethazine-DM cough syrup 2.5 mL every 6 hours as needed for cough. May cause drowsiness. -Amoxicillin 875 mg Twice daily. Notify immediately of any rash, itching, hives, or swelling, or seek emergency care.Finish your antibiotics in their entirety. Do not stop just because symptoms improve. Take with food to reduce GI upset.  Referral to ENT  Follow up in one month with Dr. Lamonte Sakai or Joellen Jersey Ahmaad Neidhardt,NP. If symptoms do not improve or worsen, please contact office for sooner follow up or seek emergency care.

## 2021-03-21 ENCOUNTER — Encounter: Payer: Self-pay | Admitting: Family Medicine

## 2021-03-22 ENCOUNTER — Ambulatory Visit (INDEPENDENT_AMBULATORY_CARE_PROVIDER_SITE_OTHER): Payer: Medicare Other

## 2021-03-22 ENCOUNTER — Other Ambulatory Visit: Payer: Self-pay

## 2021-03-22 DIAGNOSIS — Z7901 Long term (current) use of anticoagulants: Secondary | ICD-10-CM

## 2021-03-22 LAB — POCT INR: INR: 3.8 — AB (ref 2.0–3.0)

## 2021-03-22 MED ORDER — WARFARIN SODIUM 7.5 MG PO TABS: ORAL_TABLET | ORAL | 1 refills | Status: DC

## 2021-03-22 NOTE — Telephone Encounter (Signed)
Please advise 

## 2021-03-22 NOTE — Telephone Encounter (Signed)
See note

## 2021-03-22 NOTE — Patient Instructions (Signed)
HOLD TONIGHT AND Tuesday NIGHT and then Continue to take warfarin 1 tablet daily excpet for 1/2 a tablet on Monday, Wednesday and Fridays. Recheck INR in 2 weeks.  [phone]

## 2021-03-23 ENCOUNTER — Encounter: Payer: Self-pay | Admitting: Cardiovascular Disease

## 2021-03-23 DIAGNOSIS — I10 Essential (primary) hypertension: Secondary | ICD-10-CM

## 2021-03-23 DIAGNOSIS — I509 Heart failure, unspecified: Secondary | ICD-10-CM | POA: Diagnosis not present

## 2021-03-23 DIAGNOSIS — J449 Chronic obstructive pulmonary disease, unspecified: Secondary | ICD-10-CM | POA: Diagnosis not present

## 2021-03-24 MED ORDER — ATORVASTATIN CALCIUM 10 MG PO TABS: 10.0000 mg | ORAL_TABLET | Freq: Every day | ORAL | 1 refills | Status: DC

## 2021-03-24 MED ORDER — METOPROLOL TARTRATE 25 MG PO TABS: 25.0000 mg | ORAL_TABLET | Freq: Two times a day (BID) | ORAL | 3 refills | Status: DC

## 2021-03-24 MED ORDER — LOSARTAN POTASSIUM 50 MG PO TABS: 50.0000 mg | ORAL_TABLET | Freq: Every day | ORAL | 3 refills | Status: DC

## 2021-04-05 ENCOUNTER — Ambulatory Visit (INDEPENDENT_AMBULATORY_CARE_PROVIDER_SITE_OTHER): Payer: Medicare Other

## 2021-04-05 ENCOUNTER — Other Ambulatory Visit: Payer: Self-pay

## 2021-04-05 DIAGNOSIS — Z7901 Long term (current) use of anticoagulants: Secondary | ICD-10-CM | POA: Diagnosis not present

## 2021-04-05 LAB — POCT INR: INR: 2.8 (ref 2.0–3.0)

## 2021-04-05 NOTE — Patient Instructions (Signed)
Continue to take warfarin 1 tablet daily except for 1/2 a tablet on Monday, Wednesday and Fridays. Recheck INR in 6 weeks.  [phone]

## 2021-04-08 ENCOUNTER — Other Ambulatory Visit: Payer: Self-pay | Admitting: Cardiovascular Disease

## 2021-04-13 DIAGNOSIS — Z20822 Contact with and (suspected) exposure to covid-19: Secondary | ICD-10-CM | POA: Diagnosis not present

## 2021-04-15 DIAGNOSIS — Z20822 Contact with and (suspected) exposure to covid-19: Secondary | ICD-10-CM | POA: Diagnosis not present

## 2021-04-16 ENCOUNTER — Ambulatory Visit (INDEPENDENT_AMBULATORY_CARE_PROVIDER_SITE_OTHER): Payer: Medicare Other | Admitting: Podiatry

## 2021-04-16 ENCOUNTER — Other Ambulatory Visit: Payer: Self-pay

## 2021-04-16 ENCOUNTER — Encounter: Payer: Self-pay | Admitting: Podiatry

## 2021-04-16 DIAGNOSIS — M79676 Pain in unspecified toe(s): Secondary | ICD-10-CM

## 2021-04-16 DIAGNOSIS — D6851 Activated protein C resistance: Secondary | ICD-10-CM

## 2021-04-16 DIAGNOSIS — Z20822 Contact with and (suspected) exposure to covid-19: Secondary | ICD-10-CM | POA: Diagnosis not present

## 2021-04-16 DIAGNOSIS — L84 Corns and callosities: Secondary | ICD-10-CM | POA: Diagnosis not present

## 2021-04-16 DIAGNOSIS — B351 Tinea unguium: Secondary | ICD-10-CM | POA: Diagnosis not present

## 2021-04-16 DIAGNOSIS — D689 Coagulation defect, unspecified: Secondary | ICD-10-CM | POA: Diagnosis not present

## 2021-04-19 ENCOUNTER — Other Ambulatory Visit: Payer: Self-pay

## 2021-04-19 ENCOUNTER — Ambulatory Visit (INDEPENDENT_AMBULATORY_CARE_PROVIDER_SITE_OTHER): Payer: Medicare Other | Admitting: Nurse Practitioner

## 2021-04-19 ENCOUNTER — Encounter: Payer: Self-pay | Admitting: Nurse Practitioner

## 2021-04-19 DIAGNOSIS — J310 Chronic rhinitis: Secondary | ICD-10-CM

## 2021-04-19 DIAGNOSIS — D682 Hereditary deficiency of other clotting factors: Secondary | ICD-10-CM | POA: Diagnosis not present

## 2021-04-19 DIAGNOSIS — I10 Essential (primary) hypertension: Secondary | ICD-10-CM

## 2021-04-19 DIAGNOSIS — R058 Other specified cough: Secondary | ICD-10-CM

## 2021-04-19 DIAGNOSIS — Z20822 Contact with and (suspected) exposure to covid-19: Secondary | ICD-10-CM | POA: Diagnosis not present

## 2021-04-19 NOTE — Patient Instructions (Addendum)
-  Continue Flonase nasal spray 2 sprays each nostril daily -Continue Xyzal 5 mg daily -Continue Atrovent nasal spray 2 sprays each nostril up to three times a day as needed for runny nose/congestion/post nasal drip  Avoid blowing nose. Blot for any runny nose/drainage.   Contact [facility] N[facility] and Throat Dr. Jonni Sanger at [phone] to get appointment set up. It looks like they attempted to contact you or received the referral on 2/20 so they should be able to get you set up.  Follow up with Dr. Lamonte Sakai in 3 months. If symptoms do not improve or worsen, please contact office for sooner follow up or seek emergency care.

## 2021-04-19 NOTE — Assessment & Plan Note (Signed)
Likely contributing factor to the minimal cough she has left. Bronchitis seems to have resolved. Advised to stop astelin if it worsened her nose bleeds. Discussed trying to not blow nose and instead blot or pat dry. Provided with contact info to ENT to set up appointment.   Patient Instructions  -Continue Flonase nasal spray 2 sprays each nostril daily -Continue Xyzal 5 mg daily -Continue Atrovent nasal spray 2 sprays each nostril up to three times a day as needed for runny nose/congestion/post nasal drip  Avoid blowing nose. Blot for any runny nose/drainage.   Contact [facility] and Thro[facility] Dr. Jonni Sanger at [phone] to get appointment set up. It looks like they attempted to contact you or received the referral on 2/20 so they should be able to get you set up.  Follow up with Dr. Lamonte Sakai in 3 months. If symptoms do not improve or worsen, please contact office for sooner follow up or seek emergency care.

## 2021-04-19 NOTE — Assessment & Plan Note (Signed)
On lifelong anticoagulation therapy and managed by Dr. Gwenlyn Found. No excessive bleeding bruising. Does have occasional blood tinged mucous when she blows her nose. Re-educated on importance of avoiding blowing and to seek emergency care if mass/persistent epistaxis occurs. Notify Dr.Berry if any worsening occurs.

## 2021-04-19 NOTE — Assessment & Plan Note (Signed)
Occasional, dry cough likely related to postnasal drainage. Improved since previous visit. Awaiting workup with ENT for postnasal drainage control.

## 2021-04-19 NOTE — Assessment & Plan Note (Signed)
BP well-controlled. Follow up with cardiology as scheduled.  ?

## 2021-04-19 NOTE — Progress Notes (Signed)
@Patient  ID: Robin Harrell, female    DOB: [birth year], 88 y.o.   MRN: [id number]  Chief Complaint  Patient presents with   Follow-up    Bronchitis f/u    Referring provider: [name], MD  HPI: 88 year old female, former smoker followed for chronic cough d/t rhinitis, hx of COVID pneumonitis now with chronic hypoxic respiratory failure, GERD with hiatal hernia, and intermittent aspiration/choking with some associated hoarsenss. She is a patient of Dr. Agustina Caroli and last seen in office on 03/19/2021 by Kynli Chou,NP. Past medical history significant for chronic thromboembolic disease with PE and factor V Leiden deficiency on chronic anticoagulation therapy, hypertension, aortic stenosis, hypothyroidism, DJD, HLD.  TEST/EVENTS:  05/30/2019 CT chest without contrast: Atherosclerosis.  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  Minimal biapical scarring.  Subsegmental atelectasis at bases of lingula and right middle lobe and right lower lobe.  Tracheobronchial calcifications. 05/07/2020 echocardiogram: Moderate AAS and MS.  Moderate MR.  EF 60 to 65%.  Mild LVH.  Moderately elevated PA pressure.  LA severely dilated.  Tricuspid valve regurgitation moderate. 06/08/2020 CXR 2 view: Borderline enlargement of cardiac silhouette.  Minimal blunting of the left lateral costophrenic angle, questionable for tiny pleural effusion.  Lungs otherwise clear.  03/19/2021: OV with Oaklyn Jakubek NP.  Worsening cough and nasal congestion x2 weeks.  Nasal congestion with purulent drainage and occasional blood-tinged mucus.  Cough with minimal clear sputum production.  CXR with unchanged chronic blunting of left lateral costophrenic angle and some airway thickening. Treated for chronic bronchitis with prednisone and acute bacterial rhino sinusitis with amoxicillin course. Referral placed to ENT for chronic symptoms despite multiple therapies   04/19/2021: Today - follow up Patient presents today with caregiver for  follow-up after being treated for bronchitis and acute bacterial rhinosinusitis a month ago.  She reports that her cough has significantly improved.  She does still have occasional mild, dry cough which is consistent with her baseline.  She continues to have some clear nasal drainage and occasionally has some blood-tinged mucus when she blows her nose.  Despite multiple treatment regimens, she continues to have issues with chronic rhinitis.  She was referred to ENT last time; however has not heard from their office at this point.  It appears in the chart that she was referred to [facility] ear nose and throat.  She continues on Flonase and Atrovent nasal spray.  She uses Xyzal daily.  She continues to blow her nose, although we discussed at last visit trying to avoid doing so frequently and patting/blotting instead to prevent further irritation. She denies wheezing, shortness of breath, orthopnea, PND, leg swelling or hemoptysis. Overall, she feels better and offers no further complaints.   Allergies  Allergen Reactions   Azithromycin Nausea Only   Codeine Nausea Only   Erythromycin Nausea And Vomiting    Immunization History  Administered Date(s) Administered   Fluad Quad(high Dose 65+) 10/17/2018, 11/18/2019, 11/04/2020   Influenza Split 11/21/2008, 02/09/2010, 10/24/2011, 11/21/2012   Influenza Whole 11/27/2008   Influenza, High Dose Seasonal PF 11/12/2011, 11/29/2012, 11/05/2013, 01/05/2015, 11/16/2015, 10/21/2016, 11/09/2017   Influenza,inj,Quad PF,6+ Mos 11/23/2015   Influenza-Unspecified 02/02/2011   PFIZER(Purple Top)SARS-COV-2 Vaccination 06/03/2019, 06/17/2019   Pfizer Covid-19 Vaccine Bivalent Booster 64yrs & up 12/25/2020   Pneumococcal Conjugate-13 04/23/2013   Pneumococcal Polysaccharide-23 02/21/2005   Tdap 09/22/2007   Zoster Recombinat (Shingrix) 07/11/2016, 01/27/2017   Zoster, Live 04/22/2010    Past Medical History:  Diagnosis Date   Allergic rhinitis  Cough     Difficulty in swallowing    w/ occasional aspiration   DVT (deep venous thrombosis) (HCC)    Dyslipidemia    Factor V Leiden deficiency    lifelong coumadin   GERD (gastroesophageal reflux disease)    History of nuclear stress test 12/28/2007   lexiscan; low risk    HTN (hypertension)    PND (post-nasal drip)    Pneumonia due to COVID-19 virus 02/26/2019   02/25/2019-SARS-CoV-2-positive Hospitlized in [name]/2021 - [facility] -received IV steroids and remdesivir, did not appear to require mechanical ventilation   Pulmonary embolism (HCC)    PVC's (premature ventricular contractions)    Thyroid disease     Tobacco History: Social History   Tobacco Use  Smoking Status Former   Years: 1.00   Types: Cigarettes   Quit date: 02/21/1958   Years since quitting: 63.2  Smokeless Tobacco Never   Counseling given: Not Answered   Outpatient Medications Prior to Visit  Medication Sig Dispense Refill   acetaminophen (TYLENOL) 500 MG tablet Take 1,000 mg by mouth every 6 (six) hours as needed for moderate pain.     atorvastatin (LIPITOR) 10 MG tablet TAKE 1 TABLET (10 MG TOTAL) BY MOUTH DAILY AT 6 PM. 90 tablet 3   azelastine (ASTELIN) 0.1 % nasal spray Place 2 sprays into both nostrils 2 (two) times daily. Use in each nostril as directed 30 mL 3   brimonidine (ALPHAGAN) 0.15 % ophthalmic solution Place 2 drops into both eyes 2 (two) times daily.   3   Calcium-Phosphorus-Vitamin D (CALCIUM/D3 ADULT GUMMIES) 200-96.6-200 MG-MG-UNIT CHEW Chew 2 tablets by mouth daily.     fluticasone (FLONASE) 50 MCG/ACT nasal spray SPRAY 2 SPRAYS INTO EACH NOSTRIL EVERY DAY 48 mL 1   latanoprost (XALATAN) 0.005 % ophthalmic solution Place 1 drop into both eyes at bedtime.      levocetirizine (XYZAL) 5 MG tablet Take 1 tablet (5 mg total) by mouth every evening. 30 tablet 11   levothyroxine (SYNTHROID) 25 MCG tablet TAKE 1 TABLET BY MOUTH DAILY BEFORE BREAKFAST. 90 tablet 3   losartan (COZAAR) 50 MG tablet Take 1 tablet (50  mg total) by mouth daily. 90 tablet 3   metoprolol tartrate (LOPRESSOR) 25 MG tablet Take 1 tablet (25 mg total) by mouth 2 (two) times daily. 180 tablet 3   multivitamin-iron-minerals-folic acid (CENTRUM) chewable tablet Chew 1 tablet by mouth daily.     vitamin E 1000 UNIT capsule Take 1,000 Units by mouth daily.     warfarin (COUMADIN) 7.5 MG tablet TAKE 1/2 TO 1 TABLET BY MOUTH DAILY AS DIRECTED 90 tablet 1   benzonatate (TESSALON) 100 MG capsule TAKE 1 CAPSULE BY MOUTH 2 TIMES DAILY AS NEEDED FOR COUGH (Patient not taking: Reported on 04/19/2021) 30 capsule 1   ipratropium (ATROVENT) 0.03 % nasal spray PLACE 2 SPRAYS INTO BOTH NOSTRILS 3 (THREE) TIMES DAILY AS NEEDED FOR RHINITIS. (Patient not taking: Reported on 04/19/2021) 90 mL 2   promethazine-dextromethorphan (PROMETHAZINE-DM) 6.25-15 MG/5ML syrup Take 2.5 mLs by mouth 4 (four) times daily as needed for cough. (Patient not taking: Reported on 04/19/2021) 118 mL 0   No facility-administered medications prior to visit.     Review of Systems:   Constitutional: No weight loss or gain, night sweats, fevers, chills, fatigue, or lassitude. HEENT: No headaches, difficulty swallowing, tooth/dental problems, or sore throat. No sneezing, itching, ear ache. +nasal congestion, chronic rhinorrhea (clear; sometimes blood tinged) CV:  No chest pain, orthopnea, PND, swelling  in lower extremities, anasarca, dizziness, palpitations, syncope Resp: +occasional dry cough. No shortness of breath with exertion or at rest. No excess mucus or change in color of mucus. No hemoptysis. No wheezing.  No chest wall deformity GI:  No heartburn, indigestion, abdominal pain, nausea, vomiting, diarrhea, change in bowel habits, loss of appetite, bloody stools.  GU: No dysuria, change in color of urine, urgency or frequency.  No flank pain, no hematuria  Skin: No rash, lesions, ulcerations MSK:  No joint pain or swelling.  No decreased range of motion.  No back  pain. Neuro: No dizziness or lightheadedness.  Psych: No depression or anxiety. Mood stable.     Physical Exam:  BP 110/62 (BP Location: Right Arm, Cuff Size: Normal)    Pulse 86    Temp 97.8 F (36.6 C) (Oral)    Ht 5\' 4"  (1.626 m)    Wt 114 lb 9.6 oz (52 kg)    LMP  (LMP Unknown)    SpO2 97%    BMI 19.67 kg/m   GEN: Pleasant, interactive, well-kempt; elderly, frail; in no acute distress. HEENT:  Normocephalic and atraumatic. EACs patent bilaterally. TM pearly gray with present light reflex bilaterally. PERRLA. Sclera white. Nasal turbinates pink, moist and patent bilaterally. Clear rhinorrhea present. Oropharynx pink and moist, without exudate or edema. No lesions, ulcerations NECK:  Supple w/ fair ROM. No JVD present. Normal carotid impulses w/o bruits. Thyroid symmetrical with no goiter or nodules palpated. No lymphadenopathy.   CV: RRR, no m/r/g, no peripheral edema. Pulses intact, +2 bilaterally. No cyanosis, pallor or clubbing. PULMONARY:  Unlabored, regular breathing. Clear bilaterally A&P w/o wheezes/rales/rhonchi. No accessory muscle use. No dullness to percussion. GI: BS present and normoactive. Soft, non-tender to palpation. No organomegaly or masses detected. No CVA tenderness. MSK: No erythema, warmth or tenderness. Cap refil <2 sec all extrem. No deformities or joint swelling noted.  Neuro: A/Ox3. No focal deficits noted.   Skin: Warm, no lesions or rashe Psych: Normal affect and behavior. Judgement and thought content appropriate.     Lab Results:  CBC    Component Value Date/Time   WBC 4.8 11/09/2020 1105   RBC 3.82 (L) 11/09/2020 1105   HGB 12.2 11/09/2020 1105   HCT 36.4 11/09/2020 1105   PLT 136.0 (L) 11/09/2020 1105   MCV 95.2 11/09/2020 1105   MCH 30.9 11/18/2019 1605   MCHC 33.5 11/09/2020 1105   RDW 13.3 11/09/2020 1105   LYMPHSABS 1.0 11/09/2020 1105   MONOABS 0.5 11/09/2020 1105   EOSABS 0.2 11/09/2020 1105   BASOSABS 0.0 11/09/2020 1105     BMET    Component Value Date/Time   NA 141 11/09/2020 1105   K 4.6 11/09/2020 1105   CL 104 11/09/2020 1105   CO2 28 11/09/2020 1105   GLUCOSE 89 11/09/2020 1105   BUN 12 11/09/2020 1105   CREATININE 0.89 11/09/2020 1105   CREATININE 1.05 (H) 11/18/2019 1601   CALCIUM 9.1 11/09/2020 1105   GFRNONAA 49 (L) 11/18/2019 1601   GFRAA 57 (L) 11/18/2019 1601    BNP    Component Value Date/Time   BNP 263.5 (H) 03/02/2019 0039     Imaging:  No results found.    No flowsheet data found.  No results found for: NITRICOXIDE      Assessment & Plan:   Chronic rhinitis Likely contributing factor to the minimal cough she has left. Bronchitis seems to have resolved. Advised to stop astelin if it worsened her nose bleeds.  Discussed trying to not blow nose and instead blot or pat dry. Provided with contact info to ENT to set up appointment.   Patient Instructions  -Continue Flonase nasal spray 2 sprays each nostril daily -Continue Xyzal 5 mg daily -Continue Atrovent nasal spray 2 sprays each nostril up to three times a day as needed for runny nose/congestion/post nasal drip  Avoid blowing nose. Blot for any runny nose/drainage.   Contact Ear Nose and Throat Dr. Jonni Sanger at [phone] to get appointment set up. It looks like they attempted to contact you or received the referral on 2/20 so they should be able to get you set up.  Follow up with Dr. Lamonte Sakai in 3 months. If symptoms do not improve or worsen, please contact office for sooner follow up or seek emergency care.    Upper airway cough syndrome Occasional, dry cough likely related to postnasal drainage. Improved since previous visit. Awaiting workup with ENT for postnasal drainage control.   Factor V deficiency ([place]) On lifelong anticoagulation therapy and managed by Dr. Gwenlyn Found. No excessive bleeding bruising. Does have occasional blood tinged mucous when she blows her nose. Re-educated on importance of avoiding blowing  and to seek emergency care if mass/persistent epistaxis occurs. Notify Dr.Berry if any worsening occurs.   Essential hypertension BP well-controlled. Follow up with cardiology as scheduled.    Clayton Bibles, NP 04/19/2021  Pt aware and understands NP's role.

## 2021-04-23 NOTE — Progress Notes (Signed)
°  Subjective:  Patient ID: Robin Harrell, female    DOB: [birth year],  MRN: [id number]  Robin Harrell presents to clinic today for at risk foot care with h/o clotting disorder and corn(s) left 4th diigt and painful thick toenails that are difficult to trim. Painful toenails interfere with ambulation. Aggravating factors include wearing enclosed shoe gear. Pain is relieved with periodic professional debridement. Painful corns are aggravated when weightbearing when wearing enclosed shoe gear. Pain is relieved with periodic professional debridement.  PCP is [name], MD , and last visit was 12/21/2020  Allergies  Allergen Reactions   Azithromycin Nausea Only   Codeine Nausea Only   Erythromycin Nausea And Vomiting    Review of Systems: Negative except as noted in the HPI. Objective:   Constitutional WILLIE LOY is a pleasant 88 y.o. Caucasian female, WD, WN in NAD. AAO x 3.   Vascular CFT <3 seconds b/l LE. Palpable DP/PT pulses b/l LE. Digital hair absent b/l. Skin temperature gradient WNL b/l. No pain with calf compression b/l. No edema noted b/l. No cyanosis or clubbing noted b/l LE. Varicosities present b/l. No cyanosis or clubbing noted.  Neurologic Normal speech. Oriented to person, place, and time. Protective sensation intact 5/5 intact bilaterally with 10g monofilament b/l. Vibratory sensation intact b/l.  Dermatologic Pedal integument with normal turgor, texture and tone b/l LE. No open wounds b/l. No interdigital macerations b/l. Toenails 1-5 b/l elongated, thickened, discolored with subungual debris. +Tenderness with dorsal palpation of nailplates. Hyperkeratotic lesion(s) noted L 4th toe.  Orthopedic: Normal muscle strength 5/5 to all lower extremity muscle groups bilaterally. No pain, crepitus or joint limitation noted with ROM b/l LE. 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 Patient ambulates with rollator assistance.   Radiographs: None Assessment:   1. Pain due to  onychomycosis of toenail   2. Corns   3. Factor V Leiden ([place])   4. Coagulation defect ([place])     Plan:  Patient was evaluated and treated and all questions answered. Consent given for treatment as described below: -Toenails 1-5 b/l were debrided in length and girth with sterile nail nippers and dremel without iatrogenic bleeding.  -Corn(s) L 4th toe pared utilizing sterile scalpel blade without complication or incident. Total number debrided=1. -Dispensed digital toe cap. Apply to L 4th toe every morning. Remove every evening. -Continue toe cap to afftected digits daily for protection. -Patient/POA to call should there be question/concern in the interim.  Return in about 9 weeks (around 06/18/2021).  Marzetta Board, DPM

## 2021-04-26 ENCOUNTER — Telehealth: Payer: Self-pay

## 2021-04-26 NOTE — Telephone Encounter (Signed)
[facility] Approved  ? ?No PA Required  ?  ?$0 dollars  ? ?Patient ready to schedule ?

## 2021-04-26 NOTE — Telephone Encounter (Signed)
Patient is scheduled on 05/10/21 with Dr. Jonni Sanger will get it then. ?

## 2021-04-28 DIAGNOSIS — Z23 Encounter for immunization: Secondary | ICD-10-CM | POA: Diagnosis not present

## 2021-04-29 ENCOUNTER — Ambulatory Visit (INDEPENDENT_AMBULATORY_CARE_PROVIDER_SITE_OTHER): Payer: Medicare Other | Admitting: *Deleted

## 2021-04-29 ENCOUNTER — Telehealth: Payer: Self-pay | Admitting: Family Medicine

## 2021-04-29 DIAGNOSIS — S80811A Abrasion, right lower leg, initial encounter: Secondary | ICD-10-CM | POA: Diagnosis not present

## 2021-04-29 DIAGNOSIS — R059 Cough, unspecified: Secondary | ICD-10-CM

## 2021-04-29 DIAGNOSIS — J302 Other seasonal allergic rhinitis: Secondary | ICD-10-CM

## 2021-04-29 DIAGNOSIS — I10 Essential (primary) hypertension: Secondary | ICD-10-CM

## 2021-04-29 DIAGNOSIS — X58XXXA Exposure to other specified factors, initial encounter: Secondary | ICD-10-CM | POA: Diagnosis not present

## 2021-04-29 DIAGNOSIS — Z8709 Personal history of other diseases of the respiratory system: Secondary | ICD-10-CM

## 2021-04-29 DIAGNOSIS — I5189 Other ill-defined heart diseases: Secondary | ICD-10-CM

## 2021-04-29 DIAGNOSIS — Z86718 Personal history of other venous thrombosis and embolism: Secondary | ICD-10-CM

## 2021-04-29 DIAGNOSIS — I872 Venous insufficiency (chronic) (peripheral): Secondary | ICD-10-CM

## 2021-04-29 NOTE — Patient Instructions (Signed)
Visit Information ? ?Thank you for taking time to visit with me today. Please don't hesitate to contact me if I can be of assistance to you before our next scheduled telephone appointment. ? ?Following are the goals we discussed today:  ?(Copy and paste patient goals from clinical care plan here) ? ?Our next appointment is by telephone on 3/23 at 1045 ? ?Please call the care guide team at [phone] if you need to cancel or reschedule your appointment.  ? ?If you are experiencing a Mental Health or [place] or need someone to talk to, please call the Suicide and Crisis Lifeline: 988 ?call the [place] National Suicide Prevention Lifeline: (404) 339-3488 or TTY: 219 669 4292 TTY 256-162-4428) to talk to a trained counselor ?call 1-800-273-TALK (toll free, 24 hour hotline) ?call 911  ? ?Patient verbalizes understanding of instructions and care plan provided today and agrees to view in [place]. Active MyChart status confirmed with patient.   ? ?[name] RN, MSN ?RN Care Management Coordinator  ?[place] ?[phone] ?[email or web address] ?  ?

## 2021-04-29 NOTE — Telephone Encounter (Signed)
Pt states she had a heavy object fall on her lower leg. It then proceeded to bruise and swell. She states it now is swollen with blood in it and her daughter has tried to expel it. Transferred to Triage. ?

## 2021-04-29 NOTE — Chronic Care Management (AMB) (Signed)
Chronic Care Management   CCM RN Visit Note  04/29/2021 Name: Robin Harrell MRN: [id number] DOB: 1933-09-21  Subjective: Robin Harrell is a 88 y.o. year old female who is a primary care patient of [name], MD. The care management team was consulted for assistance with disease management and care coordination needs.    Engaged with patient by telephone for follow up visit in response to provider referral for case management and/or care coordination services.   Consent to Services:  The patient was given information about Chronic Care Management services, agreed to services, and gave verbal consent prior to initiation of services.  Please see initial visit note for detailed documentation.   Patient agreed to services and verbal consent obtained.   Assessment: Review of patient past medical history, allergies, medications, health status, including review of consultants reports, laboratory and other test data, was performed as part of comprehensive evaluation and provision of chronic care management services.   SDOH (Social Determinants of Health) assessments and interventions performed:    CCM Care Plan  Allergies  Allergen Reactions   Azithromycin Nausea Only   Codeine Nausea Only   Erythromycin Nausea And Vomiting    Outpatient Encounter Medications as of 04/29/2021  Medication Sig   acetaminophen (TYLENOL) 500 MG tablet Take 1,000 mg by mouth every 6 (six) hours as needed for moderate pain.   atorvastatin (LIPITOR) 10 MG tablet TAKE 1 TABLET (10 MG TOTAL) BY MOUTH DAILY AT 6 PM.   azelastine (ASTELIN) 0.1 % nasal spray Place 2 sprays into both nostrils 2 (two) times daily. Use in each nostril as directed   benzonatate (TESSALON) 100 MG capsule TAKE 1 CAPSULE BY MOUTH 2 TIMES DAILY AS NEEDED FOR COUGH (Patient not taking: Reported on 04/19/2021)   brimonidine (ALPHAGAN) 0.15 % ophthalmic solution Place 2 drops into both eyes 2 (two) times daily.     Calcium-Phosphorus-Vitamin D (CALCIUM/D3 ADULT GUMMIES) 200-96.6-200 MG-MG-UNIT CHEW Chew 2 tablets by mouth daily.   fluticasone (FLONASE) 50 MCG/ACT nasal spray SPRAY 2 SPRAYS INTO EACH NOSTRIL EVERY DAY   ipratropium (ATROVENT) 0.03 % nasal spray PLACE 2 SPRAYS INTO BOTH NOSTRILS 3 (THREE) TIMES DAILY AS NEEDED FOR RHINITIS. (Patient not taking: Reported on 04/19/2021)   latanoprost (XALATAN) 0.005 % ophthalmic solution Place 1 drop into both eyes at bedtime.    levocetirizine (XYZAL) 5 MG tablet Take 1 tablet (5 mg total) by mouth every evening.   levothyroxine (SYNTHROID) 25 MCG tablet TAKE 1 TABLET BY MOUTH DAILY BEFORE BREAKFAST.   losartan (COZAAR) 50 MG tablet Take 1 tablet (50 mg total) by mouth daily.   metoprolol tartrate (LOPRESSOR) 25 MG tablet Take 1 tablet (25 mg total) by mouth 2 (two) times daily.   multivitamin-iron-minerals-folic acid (CENTRUM) chewable tablet Chew 1 tablet by mouth daily.   promethazine-dextromethorphan (PROMETHAZINE-DM) 6.25-15 MG/5ML syrup Take 2.5 mLs by mouth 4 (four) times daily as needed for cough. (Patient not taking: Reported on 04/19/2021)   vitamin E 1000 UNIT capsule Take 1,000 Units by mouth daily.   warfarin (COUMADIN) 7.5 MG tablet TAKE 1/2 TO 1 TABLET BY MOUTH DAILY AS DIRECTED   No facility-administered encounter medications on file as of 04/29/2021.    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 01/29/2019   Chronic venous stasis dermatitis of both lower extremities  12/31/2018   Aortic stenosis, mild 02/07/2017   History of recurrent UTIs 01/26/2017   Chronic idiopathic thrombocytopenia ([place]) 04/14/2016   Essential tremor 09/03/2015   History of respiratory failure 11/11/2014   Hypothyroidism (acquired) 09/02/2014   Edema of right lower extremity 02/01/2013    Hyperlipidemia 11/19/2012   Colon polyp 10/15/2012   Long term (current) use of anticoagulants 05/10/2012   Insomnia 10/11/2011   Osteoarthritis, multiple sites 93/23/5573   Diastolic dysfunction 22/03/5425   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   Osteoporosis 02/09/2010   DJD (degenerative joint disease), lumbar 10/14/2009   Chronic rhinitis 09/04/2008   Glaucoma 01/29/2008   Essential hypertension 11/15/2007   History of DVT (deep vein thrombosis) 11/15/2007   Seasonal and perennial allergic rhinitis 11/15/2007   GERD 11/15/2007   Upper airway cough syndrome 11/15/2007   COUGH, CHRONIC 11/15/2007    Conditions to be addressed/monitored:CHF and COPD  Care Plan : RNCM COUGH/Heart Failure (Adult)  Updates made by Leona Singleton, RN since 04/29/2021 12:00 AM     Problem: Symptom Exacerbation (Heart Failure/Cough)   Priority: Medium     Long-Range Goal: Symptom Exacerbation Prevented or Minimized   Start Date: 03/18/2021  Expected End Date: 03/18/2022  Recent Progress: On track  Priority: Medium  Note:   Current Barriers:  Knowledge deficit related to basic heart failure pathophysiology and self care management of cough/allergies;  patient with history of diastolic dysfunction and chronic lower extremity edema and allergies with chronic cough.  States she does weigh herself daily.  1/23--Weight this morning was 112.4 pounds.  Denies any shortness of breath or swelling; Does endorse bad coughing spells over the past few months.  Was prescribed Xyzol and [employer] but continues to have long spells.  Patient encouraged to schedule pulmonology appointment to discuss cough and change in medications.  Daughters are assisting patient  and husband (recently had 2 strokes and is blind).  Daughter is assisting with prepackage mail order medications (offered CCM Pharmacist referral-decline at this time) Lacks reliable transportation; patient and spouse are no longer able to drive and are  dependent upon friends for transportation to medical appointments; no family in town; Care guide referral placed for transportation resources 3/9--patient reporting frozen food/container fell on here right leg just abone the ankle area a week ago.  Developed large egg sized hematoma.  When hematoma did not go down, patient and caregiver decided tostick needle in hematoma to drain by sticking it with a needle yesterday; did not drain completely but bleeding.  Instructed to not bother wound any longer and to call pcp.  Rncm assisted patient in contacting pcp for appointment asap.  WEIGHT IMCREASED TO 116 POUNDS.  Reports prepackaged meds to start in April.   Case Manager Clinical Goal(s):  patient will weigh self daily and record patient will verbalize understanding of Heart Failure Action Plan and when to call doctor patient will weigh daily and record (notifying MD of 3 lb weight gain over night or 5 lb in a week)  COUGH/COPD: (Status: New goal.) Long Term Goal  Reviewed medications with patient, including use of prescribed maintenance and rescue inhalers, and provided instruction on medication management and the importance of adherence Advised patient to track and manage COPD triggers Provided written and verbal instructions on pursed lip breathing and utilized returned demonstration as teach back Advised patient to self assesses COPD action plan zone and make appointment with provider if in the yellow zone for 48 hours without improvement Discussed the importance  of adequate rest and management of fatigue with COPD  Encouraged to discuss continued cough with provider  Heart Failure Interventions:  (Status: Goal on Track (progressing): YES.)  Long Term Goal  Wt Readings from Last 3 Encounters:  01/22/21 112 lb 3.2 oz (50.9 kg)  11/04/20 113 lb 9.6 oz (51.5 kg)  10/14/20 113 lb 9.6 oz (51.5 kg)  Basic overview and discussion of pathophysiology of Heart Failure reviewed Provided education on low  sodium diet Reviewed Heart Failure Action Plan in depth and provided written copy Discussed importance of daily weight and advised patient to weigh and record daily Reviewed role of diuretics in prevention of fluid overload and management of heart failure Discussed the importance of keeping all appointments with provider  Collaboration with [name], MD regarding development and update of comprehensive plan of care as evidenced by provider attestation and co-signature Inter-disciplinary care team collaboration (see longitudinal plan of care) Discussed when to call provider based on weight. Barriers to lifestyle changes reviewed and addressed and barriers to treatment reviewed and addressed Healthy lifestyle promoted Medication-adherence assessment completed Self-awareness of signs/symptoms of worsening disease encouraged Encouraged to attend all scheduled provider appointments Encouraged to call provider office for new concerns or questions Patient Goals/Self-Care Activities: Call office if I gain more than 2 pounds in one day or 5 pounds in one week Keep legs up while sitting Use salt in moderation Watch for swelling in feet, ankles and legs every day Weigh myself daily and record in log, taking log for provider review Schedule appointment with pulmonology regarding coughing DO NOT TRY TO DRAIN RIGHT LEG WOUND SCHEDULE APPOINTMENT WITH PCP ASAP Follow Up Plan: The care management team will reach out to the patient again over the next 20 business days.      Plan:The care management team will reach out to the patient again over the next 20 days.  [name] RN, MSN RN Care Management Coordinator  [facility] [phone] [email or web address]

## 2021-04-30 NOTE — Telephone Encounter (Signed)
FYI; Pt has an upcoming app 05/10/2021. ?

## 2021-04-30 NOTE — Telephone Encounter (Signed)
Patient went to UC.  ? ? ? ? ?Caller states something fell on her leg a week ago ?and now she has a blood fill bump on her leg. ?Caller states she had tried to popped the blister and ?now its a open wound. Caller would like to know ?what to do. ?[place] Not Listed UC ?Translation No ?Nurse Assessment ?Nurse: Altamease Oiler, RN, Adriana Date/Time (Eastern Time): 04/29/2021 1:45:02 PM ?Confirm and document reason for call. If ?symptomatic, describe symptoms. ?---pt states that she opened up her freezer last week ?and a frozen meal fell out and hit her leg. 15 min ?later developed a hematoma. about the size of an ?egg. yesterday they tried to evacuate it, some blood ?did come out but about the size of a quarter. it is still ?bleeding/leaking. ?Does the patient have any new or worsening ?symptoms? ---Yes ?Will a triage be completed? ---Yes ?Related visit to physician within the last 2 weeks? ---No ?Does the PT have any chronic conditions? (i.e. ?diabetes, asthma, this includes High risk factors for ?pregnancy, etc.) ?---Yes ?List chronic conditions. ---factor V leiden glaucoma ?Is this a behavioral health or substance abuse call? ---No ?PLEASE NOTE: All timestamps contained within this report are represented as [place] Standard Time. ?CONFIDENTIALTY NOTICE: This fax transmission is intended only for the addressee. It contains information that is legally privileged, confidential or ?otherwise protected from use or disclosure. If you are not the intended recipient, you are strictly prohibited from reviewing, disclosing, copying using ?or disseminating any of this information or taking any action in reliance on or regarding this information. If you have received this fax in error, please ?notify us immediately by telephone so that we can arrange for its return to [place]. Phone: [phone], Toll-Free: [phone], Fax: [phone] ?Page: 2 of 2 ?Call Id: [id number] ?Guidelines ?Guideline Title Affirmed Question Affirmed Notes  Nurse Date/Time (Eastern ?Time) ?Bruises Taking Coumadin ?(warfarin) or other ?strong blood thinner, ?or known bleeding ?disorder (e.g., ?thrombocytopenia) ?Altamease Oiler, RN, [place] 04/29/2021 1:49:15 PM ?Puncture Wound [1] Last tetanus shot ?> 10 years ago AND ?[2] CLEAN puncture ?(e.g., object AND ?skin were clean) ?Altamease Oiler, RN, [place] 04/29/2021 1:50:43 PM ?Disp. Time (Eastern ?Time) Disposition Final User ?04/29/2021 1:50:17 PM See PCP within 24 Hours Altamease Oiler, RN, Adriana ?04/29/2021 1:58:34 PM See HCP within 4 Hours (or ?PCP triage) ? ?

## 2021-05-03 ENCOUNTER — Telehealth: Payer: Self-pay | Admitting: Family Medicine

## 2021-05-03 NOTE — Telephone Encounter (Signed)
Patient called stated she was giving antibiotic over at the urgent care- UC provided asked her to set up an appt with Dr Jonni Sanger - pt is coming in on 3/20- Patient was advised and recommended to come in this week however patient does not have a ride.- she stated she will find a ride and will call office back otherwise she will see Dr Jonni Sanger on 3/20.-  ?

## 2021-05-04 ENCOUNTER — Ambulatory Visit (INDEPENDENT_AMBULATORY_CARE_PROVIDER_SITE_OTHER): Payer: Medicare Other | Admitting: Family Medicine

## 2021-05-04 ENCOUNTER — Encounter: Payer: Self-pay | Admitting: Family Medicine

## 2021-05-04 VITALS — BP 119/75 | HR 93 | Temp 97.7°F | Ht 64.0 in | Wt 119.2 lb

## 2021-05-04 DIAGNOSIS — I872 Venous insufficiency (chronic) (peripheral): Secondary | ICD-10-CM | POA: Diagnosis not present

## 2021-05-04 DIAGNOSIS — S81801D Unspecified open wound, right lower leg, subsequent encounter: Secondary | ICD-10-CM

## 2021-05-04 DIAGNOSIS — Z7901 Long term (current) use of anticoagulants: Secondary | ICD-10-CM | POA: Diagnosis not present

## 2021-05-04 NOTE — Progress Notes (Signed)
? ?Subjective  ?CC:  ?Chief Complaint  ?Patient presents with  ? Follow-up  ?  Urgent Care-04/29/2021. ([facility]).  Abrasion of right low extremity. She was given Keflex for Prophylaxis. Told to monitor site for infection.   ? ? ?HPI: Robin Harrell is a 88 y.o. female who presents to the office today to address the problems listed above in the chief complaint. ?Reviewed urgent care notes.  Patient has right anterior shin wound with some bleeding.  On Keflex.  Traumatic injury from frozen dinner that fell out of the freezer onto her right shin.  Overall she feels that it is getting better.  No pain.  No fevers or chills. ?Daily dressing changes from her home caretaker.  Vaseline applied lubricant ?Assessment  ?1. Leg wound, right, subsequent encounter   ?2. Chronic venous stasis dermatitis of both lower extremities   ?3. Long term (current) use of anticoagulants   ? ?  ?Plan  ?Leg wound right: To complete antibiotics.  No worsening infection.  Wound care discussed.  Concerned about healing: Refer to wound care center for further treatment and evaluation.  Patient to monitor for worsening infection.  Discussed daily dressing changes.  Elevate as tolerated ? ? ?Follow up:  as scheduled ?05/10/2021 ? ?No orders of the defined types were placed in this encounter. ? ?No orders of the defined types were placed in this encounter. ? ?  ? ?I reviewed the patients updated PMH, FH, and SocHx.  ?  ?Patient Active Problem List  ? Diagnosis Date Noted  ? Hypothyroidism (acquired) 09/02/2014  ?  Priority: High  ? Hyperlipidemia 11/19/2012  ?  Priority: High  ? Long term (current) use of anticoagulants 05/10/2012  ?  Priority: High  ? Diastolic dysfunction 99/77/4142  ?  Priority: High  ? 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  Priority: High  ? Osteoporosis 02/09/2010  ?  Priority: High  ? Essential hypertension 11/15/2007  ?  Priority: High  ? Chronic venous stasis dermatitis of both lower extremities 12/31/2018  ?   Priority: Medium   ? Aortic stenosis, mild 02/07/2017  ?  Priority: Medium   ? Chronic idiopathic thrombocytopenia ([place]) 04/14/2016  ?  Priority: Medium   ? Essential tremor 09/03/2015  ?  Priority: Medium   ? Edema of right lower extremity 02/01/2013  ?  Priority: Medium   ? Colon polyp 10/15/2012  ?  Priority: Medium   ? Insomnia 10/11/2011  ?  Priority: Medium   ? Osteoarthritis, multiple sites 06/30/2011  ?  Priority: Medium   ? DJD (degenerative joint disease), lumbar 10/14/2009  ?  Priority: Medium   ? Glaucoma 01/29/2008  ?  Priority: Medium   ? History of DVT (deep vein thrombosis) 11/15/2007  ?  Priority: Medium   ? GERD 11/15/2007  ?  Priority: Medium   ? COUGH, CHRONIC 11/15/2007  ?  Priority: Medium   ? Primary osteoarthritis of both knees 11/18/2019  ?  Priority: Low  ? 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  Priority: Low  ? History of recurrent UTIs 01/26/2017  ?  Priority: Low  ? History of respiratory failure 11/11/2014  ?  Priority: Low  ? Seasonal and perennial allergic rhinitis 11/15/2007  ?  Priority: Low  ? Bronchitis 03/19/2021  ? Urinary frequency 01/22/2021  ? Traumatic coccydynia 11/04/2020  ? Chronic rhinitis 09/04/2008  ? Upper airway cough syndrome 11/15/2007  ? ?Current Meds  ?Medication Sig  ? acetaminophen (TYLENOL) 500  MG tablet Take 1,000 mg by mouth every 6 (six) hours as needed for moderate pain.  ? atorvastatin (LIPITOR) 10 MG tablet TAKE 1 TABLET (10 MG TOTAL) BY MOUTH DAILY AT 6 PM.  ? azelastine (ASTELIN) 0.1 % nasal spray Place 2 sprays into both nostrils 2 (two) times daily. Use in each nostril as directed  ? brimonidine (ALPHAGAN) 0.15 % ophthalmic solution Place 2 drops into both eyes 2 (two) times daily.   ? brimonidine (ALPHAGAN) 0.2 % ophthalmic solution PLACE 1 DROP INTO BOTH EYES TWICE DAILY.  ? Calcium-Phosphorus-Vitamin D (CALCIUM/D3 ADULT GUMMIES) 200-96.6-200 MG-MG-UNIT CHEW Chew 2 tablets by mouth daily.  ? fluticasone (FLONASE) 50 MCG/ACT nasal spray SPRAY 2  SPRAYS INTO EACH NOSTRIL EVERY DAY  ? latanoprost (XALATAN) 0.005 % ophthalmic solution Place 1 drop into both eyes at bedtime.   ? levocetirizine (XYZAL) 5 MG tablet Take 1 tablet (5 mg total) by mouth every evening.  ? levothyroxine (SYNTHROID) 25 MCG tablet TAKE 1 TABLET BY MOUTH DAILY BEFORE BREAKFAST.  ? losartan (COZAAR) 50 MG tablet Take 1 tablet (50 mg total) by mouth daily.  ? metoprolol tartrate (LOPRESSOR) 25 MG tablet Take 1 tablet (25 mg total) by mouth 2 (two) times daily.  ? multivitamin-iron-minerals-folic acid (CENTRUM) chewable tablet Chew 1 tablet by mouth daily.  ? vitamin E 1000 UNIT capsule Take 1,000 Units by mouth daily.  ? warfarin (COUMADIN) 7.5 MG tablet TAKE 1/2 TO 1 TABLET BY MOUTH DAILY AS DIRECTED  ? ? ?Allergies: ?Patient is allergic to azithromycin, codeine, and erythromycin. ?Family History: ?Patient family history includes Allergic rhinitis in her sister; Breast cancer (age of onset: 24) in her daughter; Cirrhosis in her father; Coronary artery disease in her maternal grandfather and paternal grandfather; Heart disease in her paternal grandmother; Lung cancer in her father; Skin cancer in her brother; Throat cancer in her maternal grandfather and paternal grandfather. ?Social History:  ?Patient  reports that she quit smoking about 63 years ago. Her smoking use included cigarettes. She has never used smokeless tobacco. She reports current alcohol use. She reports that she does not use drugs. ? ?Review of Systems: ?Constitutional: Negative for fever malaise or anorexia ?Cardiovascular: negative for chest pain ?Respiratory: negative for SOB or persistent cough ?Gastrointestinal: negative for abdominal pain ? ?Objective  ?Vitals: BP 119/75   Pulse 93   Temp 97.7 ?F (36.5 ?C) (Temporal)   Ht '5\' 4"'$  (1.626 m)   Wt 119 lb 3.2 oz (54.1 kg)   LMP  (LMP Unknown)   SpO2 97%   BMI 20.46 kg/m?  ?General: no acute distress , A&Ox3 ?Right shin:  ?Quarter sized hematoma with central early  ulceration.  Mild surrounding erythema.  No pus.  Mildly surrounding erythema with tenderness.  Chronic venous stasis changes present ? ? ? ?Commons side effects, risks, benefits, and alternatives for medications and treatment plan prescribed today were discussed, and the patient expressed understanding of the given instructions. Patient is instructed to call or message via MyChart if he/she has any questions or concerns regarding our treatment plan. No barriers to understanding were identified. We discussed Red Flag symptoms and signs in detail. Patient expressed understanding regarding what to do in case of urgent or emergency type symptoms.  ?Medication list was reconciled, printed and provided to the patient in AVS. Patient instructions and summary information was reviewed with the patient as documented in the AVS. ?This note was prepared with assistance of Systems analyst. Occasional wrong-word or sound-a-like substitutions  may have occurred due to the inherent limitations of voice recognition software ? ?This visit occurred during the SARS-CoV-2 public health emergency.  Safety protocols were in place, including screening questions prior to the visit, additional usage of staff PPE, and extensive cleaning of exam room while observing appropriate contact time as indicated for disinfecting solutions.  ? ?

## 2021-05-04 NOTE — Patient Instructions (Addendum)
Please follow up as scheduled for your next visit with me: 05/10/2021  ? ?I've placed a referral to the wound care center.  ?If you have any questions or concerns, please don't hesitate to send me a message via MyChart or call the office at [phone]. Thank you for visiting with [place] today! It's our pleasure caring for you.  ?

## 2021-05-06 DIAGNOSIS — R0981 Nasal congestion: Secondary | ICD-10-CM | POA: Diagnosis not present

## 2021-05-06 DIAGNOSIS — J309 Allergic rhinitis, unspecified: Secondary | ICD-10-CM | POA: Diagnosis not present

## 2021-05-08 DIAGNOSIS — Z20822 Contact with and (suspected) exposure to covid-19: Secondary | ICD-10-CM | POA: Diagnosis not present

## 2021-05-10 ENCOUNTER — Ambulatory Visit (INDEPENDENT_AMBULATORY_CARE_PROVIDER_SITE_OTHER): Payer: Medicare Other | Admitting: Family Medicine

## 2021-05-10 ENCOUNTER — Encounter: Payer: Self-pay | Admitting: Family Medicine

## 2021-05-10 VITALS — BP 129/81 | HR 110 | Temp 97.8°F | Ht 64.0 in | Wt 123.2 lb

## 2021-05-10 DIAGNOSIS — K219 Gastro-esophageal reflux disease without esophagitis: Secondary | ICD-10-CM

## 2021-05-10 DIAGNOSIS — Z86718 Personal history of other venous thrombosis and embolism: Secondary | ICD-10-CM | POA: Diagnosis not present

## 2021-05-10 DIAGNOSIS — D693 Immune thrombocytopenic purpura: Secondary | ICD-10-CM | POA: Diagnosis not present

## 2021-05-10 DIAGNOSIS — R058 Other specified cough: Secondary | ICD-10-CM

## 2021-05-10 DIAGNOSIS — S81801D Unspecified open wound, right lower leg, subsequent encounter: Secondary | ICD-10-CM

## 2021-05-10 DIAGNOSIS — R600 Localized edema: Secondary | ICD-10-CM

## 2021-05-10 DIAGNOSIS — E782 Mixed hyperlipidemia: Secondary | ICD-10-CM | POA: Diagnosis not present

## 2021-05-10 DIAGNOSIS — R053 Chronic cough: Secondary | ICD-10-CM

## 2021-05-10 DIAGNOSIS — I10 Essential (primary) hypertension: Secondary | ICD-10-CM

## 2021-05-10 DIAGNOSIS — I872 Venous insufficiency (chronic) (peripheral): Secondary | ICD-10-CM

## 2021-05-10 NOTE — Patient Instructions (Addendum)
Please return in 6 months for your annual complete physical; please come fasting.  ? ?Please call the wound care center to schedule an appointment. I have placed another referral. [phone] ? ?If you have any questions or concerns, please don't hesitate to send me a message via MyChart or call the office at [phone]. Thank you for visiting with [place] today! It's our pleasure caring for you.  ?

## 2021-05-10 NOTE — Progress Notes (Signed)
? ?Subjective  ?CC:  ?Chief Complaint  ?Patient presents with  ? Wound Check  ?  Right leg.  ? ? ?HPI: Robin Harrell is a 88 y.o. female who presents to the office today to address the problems listed above in the chief complaint. ?Right leg wound: Follow-up.  Unfortunately, patient missed the phone call from wound care center has not been yet seen by them.  However she completed her antibiotics and the wound does appear stable.  Occasional mild oozing of blood on bandages.  Mildly sore.  Swelling is stable.  No systemic symptoms.  See last note for details ?Hypertension, hyperlipidemia, idiopathic thrombocytopenia chronic venous insufficiency follow-up: Chronic problems are all stable.  She continues on medications without side effects.  Continues on PPI for GERD. ?Chronic cough: Recently seen by ENT.  Notes reviewed.  Follows with pulmonology.  On multiple medications. ? ?Assessment  ?1. Leg wound, right, subsequent encounter   ?2. Chronic venous stasis dermatitis of both lower extremities   ?3. Essential hypertension   ?4. Mixed hyperlipidemia   ?5. Chronic idiopathic thrombocytopenia (HCC)   ?6. Chronic cough   ?7. Edema of right lower extremity   ?8. History of DVT (deep vein thrombosis)   ?9. Gastroesophageal reflux disease, unspecified whether esophagitis present   ?10. Upper airway cough syndrome   ? ?  ?Plan  ?Leg wound: On blood thinner, no infection now.  Chronic venous insufficiency and edema present.  Recommend elevating the legs.  Daily bandages and refer back to wound care.  I am concerned that if the wound ulcerate she will need further assistance.  Patient understands and agrees with care plan ?Hypertension, hyperlipidemia, ITP, chronic insufficiency: Continue current medications.  Stable.  Reviewed labs from September.  All at goal ?Chronic cough per ENT and pulmonology ?GERD chronic PPI. ? ?Follow up: 6 months for complete physical ?05/13/2021 ? ?Orders Placed This Encounter  ?Procedures  ?  AMB referral to wound care center  ? ?No orders of the defined types were placed in this encounter. ? ?  ? ?I reviewed the patients updated PMH, FH, and SocHx.  ?  ?Patient Active Problem List  ? Diagnosis Date Noted  ? Hypothyroidism (acquired) 09/02/2014  ?  Priority: High  ? Mixed hyperlipidemia 11/19/2012  ?  Priority: High  ? Long term (current) use of anticoagulants 05/10/2012  ?  Priority: High  ? Diastolic dysfunction 82/50/5397  ?  Priority: High  ? Factor V deficiency ([place]) 02/09/2010  ?  Priority: High  ? Osteoporosis 02/09/2010  ?  Priority: High  ? Essential hypertension 11/15/2007  ?  Priority: High  ? Chronic venous stasis dermatitis of both lower extremities 12/31/2018  ?  Priority: Medium   ? Aortic stenosis, mild 02/07/2017  ?  Priority: Medium   ? Chronic idiopathic thrombocytopenia ([place]) 04/14/2016  ?  Priority: Medium   ? Essential tremor 09/03/2015  ?  Priority: Medium   ? Edema of right lower extremity 02/01/2013  ?  Priority: Medium   ? Colon polyp 10/15/2012  ?  Priority: Medium   ? Insomnia 10/11/2011  ?  Priority: Medium   ? Osteoarthritis, multiple sites 06/30/2011  ?  Priority: Medium   ? DJD (degenerative joint disease), lumbar 10/14/2009  ?  Priority: Medium   ? Glaucoma 01/29/2008  ?  Priority: Medium   ? History of DVT (deep vein thrombosis) 11/15/2007  ?  Priority: Medium   ? GERD 11/15/2007  ?  Priority: Medium   ?  Chronic cough 11/15/2007  ?  Priority: Medium   ? Primary osteoarthritis of both knees 11/18/2019  ?  Priority: Low  ? 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  Priority: Low  ? History of recurrent UTIs 01/26/2017  ?  Priority: Low  ? History of respiratory failure 11/11/2014  ?  Priority: Low  ? Seasonal and perennial allergic rhinitis 11/15/2007  ?  Priority: Low  ? Bronchitis 03/19/2021  ? Urinary frequency 01/22/2021  ? Traumatic coccydynia 11/04/2020  ? Chronic rhinitis 09/04/2008  ? Upper airway cough syndrome 11/15/2007  ? ?Current Meds  ?Medication Sig  ?  acetaminophen (TYLENOL) 500 MG tablet Take 1,000 mg by mouth every 6 (six) hours as needed for moderate pain.  ? atorvastatin (LIPITOR) 10 MG tablet TAKE 1 TABLET (10 MG TOTAL) BY MOUTH DAILY AT 6 PM.  ? azelastine (ASTELIN) 0.1 % nasal spray Place 2 sprays into both nostrils 2 (two) times daily. Use in each nostril as directed  ? brimonidine (ALPHAGAN) 0.15 % ophthalmic solution Place 2 drops into both eyes 2 (two) times daily.   ? brimonidine (ALPHAGAN) 0.2 % ophthalmic solution PLACE 1 DROP INTO BOTH EYES TWICE DAILY.  ? Calcium-Phosphorus-Vitamin D (CALCIUM/D3 ADULT GUMMIES) 200-96.6-200 MG-MG-UNIT CHEW Chew 2 tablets by mouth daily.  ? fluticasone (FLONASE) 50 MCG/ACT nasal spray SPRAY 2 SPRAYS INTO EACH NOSTRIL EVERY DAY  ? latanoprost (XALATAN) 0.005 % ophthalmic solution Place 1 drop into both eyes at bedtime.   ? levocetirizine (XYZAL) 5 MG tablet Take 1 tablet (5 mg total) by mouth every evening.  ? levothyroxine (SYNTHROID) 25 MCG tablet TAKE 1 TABLET BY MOUTH DAILY BEFORE BREAKFAST.  ? losartan (COZAAR) 50 MG tablet Take 1 tablet (50 mg total) by mouth daily.  ? metoprolol tartrate (LOPRESSOR) 25 MG tablet Take 1 tablet (25 mg total) by mouth 2 (two) times daily.  ? multivitamin-iron-minerals-folic acid (CENTRUM) chewable tablet Chew 1 tablet by mouth daily.  ? vitamin E 1000 UNIT capsule Take 1,000 Units by mouth daily.  ? warfarin (COUMADIN) 7.5 MG tablet TAKE 1/2 TO 1 TABLET BY MOUTH DAILY AS DIRECTED  ? ? ?Allergies: ?Patient is allergic to azithromycin, codeine, and erythromycin. ?Family History: ?Patient family history includes Allergic rhinitis in her sister; Breast cancer (age of onset: 4) in her daughter; Cirrhosis in her father; Coronary artery disease in her maternal grandfather and paternal grandfather; Heart disease in her paternal grandmother; Lung cancer in her father; Skin cancer in her brother; Throat cancer in her maternal grandfather and paternal grandfather. ?Social History:   ?Patient  reports that she quit smoking about 63 years ago. Her smoking use included cigarettes. She has never used smokeless tobacco. She reports current alcohol use. She reports that she does not use drugs. ? ?Review of Systems: ?Constitutional: Negative for fever malaise or anorexia ?Cardiovascular: negative for chest pain ?Respiratory: negative for SOB or persistent cough ?Gastrointestinal: negative for abdominal pain ? ?Objective  ?Vitals: BP 129/81   Pulse (!) 110   Temp 97.8 ?F (36.6 ?C) (Temporal)   Ht '5\' 4"'$  (1.626 m)   Wt 123 lb 3.2 oz (55.9 kg)   LMP  (LMP Unknown)   SpO2 94%   BMI 21.15 kg/m?  ?General: no acute distress , A&Ox3 ?Kyphotic  ?cardiovascular:  RRR systolic murmur present ?Respiratory:  Good breath sounds bilaterally, CTAB with normal respiratory effort ?Trace pitting edema bilateral lower extremities ?Right anterior shin with quarter sized hematoma present. ? ? ? ?Commons side effects, risks, benefits,  and alternatives for medications and treatment plan prescribed today were discussed, and the patient expressed understanding of the given instructions. Patient is instructed to call or message via MyChart if he/she has any questions or concerns regarding our treatment plan. No barriers to understanding were identified. We discussed Red Flag symptoms and signs in detail. Patient expressed understanding regarding what to do in case of urgent or emergency type symptoms.  ?Medication list was reconciled, printed and provided to the patient in AVS. Patient instructions and summary information was reviewed with the patient as documented in the AVS. ?This note was prepared with assistance of Systems analyst. Occasional wrong-word or sound-a-like substitutions may have occurred due to the inherent limitations of voice recognition software ? ?This visit occurred during the SARS-CoV-2 public health emergency.  Safety protocols were in place, including screening questions prior  to the visit, additional usage of staff PPE, and extensive cleaning of exam room while observing appropriate contact time as indicated for disinfecting solutions.  ? ?

## 2021-05-13 ENCOUNTER — Ambulatory Visit: Payer: Medicare Other | Admitting: *Deleted

## 2021-05-13 DIAGNOSIS — I10 Essential (primary) hypertension: Secondary | ICD-10-CM

## 2021-05-13 DIAGNOSIS — S81801D Unspecified open wound, right lower leg, subsequent encounter: Secondary | ICD-10-CM

## 2021-05-13 DIAGNOSIS — I5189 Other ill-defined heart diseases: Secondary | ICD-10-CM

## 2021-05-13 NOTE — Chronic Care Management (AMB) (Signed)
?Chronic Care Management  ? ?CCM RN Visit Note ? ?05/13/2021 ?Name: Robin Harrell MRN: [id number] DOB: 1933-08-21 ? ?Subjective: ?Robin Harrell is a 88 y.o. year old female who is a primary care patient of [name], MD. The care management team was consulted for assistance with disease management and care coordination needs.   ? ?Engaged with patient by telephone for follow up visit in response to provider referral for case management and/or care coordination services.  ? ?Consent to Services:  ?The patient was given information about Chronic Care Management services, agreed to services, and gave verbal consent prior to initiation of services.  Please see initial visit note for detailed documentation.  ? ?Patient agreed to services and verbal consent obtained.  ? ?Assessment: Review of patient past medical history, allergies, medications, health status, including review of consultants reports, laboratory and other test data, was performed as part of comprehensive evaluation and provision of chronic care management services.  ? ?SDOH (Social Determinants of Health) assessments and interventions performed:   ? ?CCM Care Plan ? ?Allergies  ?Allergen Reactions  ? Azithromycin Nausea Only  ? Codeine Nausea Only  ? Erythromycin Nausea And Vomiting  ? ? ?Outpatient Encounter Medications as of 05/13/2021  ?Medication Sig  ? acetaminophen (TYLENOL) 500 MG tablet Take 1,000 mg by mouth every 6 (six) hours as needed for moderate pain.  ? atorvastatin (LIPITOR) 10 MG tablet TAKE 1 TABLET (10 MG TOTAL) BY MOUTH DAILY AT 6 PM.  ? azelastine (ASTELIN) 0.1 % nasal spray Place 2 sprays into both nostrils 2 (two) times daily. Use in each nostril as directed  ? brimonidine (ALPHAGAN) 0.15 % ophthalmic solution Place 2 drops into both eyes 2 (two) times daily.   ? brimonidine (ALPHAGAN) 0.2 % ophthalmic solution PLACE 1 DROP INTO BOTH EYES TWICE DAILY.  ? Calcium-Phosphorus-Vitamin D (CALCIUM/D3 ADULT GUMMIES) 200-96.6-200  MG-MG-UNIT CHEW Chew 2 tablets by mouth daily.  ? fluticasone (FLONASE) 50 MCG/ACT nasal spray SPRAY 2 SPRAYS INTO EACH NOSTRIL EVERY DAY  ? latanoprost (XALATAN) 0.005 % ophthalmic solution Place 1 drop into both eyes at bedtime.   ? levocetirizine (XYZAL) 5 MG tablet Take 1 tablet (5 mg total) by mouth every evening.  ? levothyroxine (SYNTHROID) 25 MCG tablet TAKE 1 TABLET BY MOUTH DAILY BEFORE BREAKFAST.  ? losartan (COZAAR) 50 MG tablet Take 1 tablet (50 mg total) by mouth daily.  ? metoprolol tartrate (LOPRESSOR) 25 MG tablet Take 1 tablet (25 mg total) by mouth 2 (two) times daily.  ? multivitamin-iron-minerals-folic acid (CENTRUM) chewable tablet Chew 1 tablet by mouth daily.  ? promethazine-dextromethorphan (PROMETHAZINE-DM) 6.25-15 MG/5ML syrup Take 2.5 mLs by mouth 4 (four) times daily as needed for cough. (Patient not taking: Reported on 04/19/2021)  ? vitamin E 1000 UNIT capsule Take 1,000 Units by mouth daily.  ? warfarin (COUMADIN) 7.5 MG tablet TAKE 1/2 TO 1 TABLET BY MOUTH DAILY AS DIRECTED  ? ?No facility-administered encounter medications on file as of 05/13/2021.  ? ? ?Patient Active Problem List  ? Diagnosis Date Noted  ? Bronchitis 03/19/2021  ? Urinary frequency 01/22/2021  ? Traumatic coccydynia 11/04/2020  ? Primary osteoarthritis of both knees 11/18/2019  ? 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 Chronic venous stasis dermatitis of both lower extremities 12/31/2018  ? Aortic stenosis, mild 02/07/2017  ? History of recurrent UTIs 01/26/2017  ? Chronic idiopathic thrombocytopenia ([place]) 04/14/2016  ? Essential tremor 09/03/2015  ? History of respiratory failure 11/11/2014  ? Hypothyroidism (acquired)  09/02/2014  ? Edema of right lower extremity 02/01/2013  ? Mixed hyperlipidemia 11/19/2012  ? Colon polyp 10/15/2012  ? Long term (current) use of anticoagulants 05/10/2012  ? Insomnia 10/11/2011  ? Osteoarthritis, multiple sites 06/30/2011  ? Diastolic dysfunction 60/45/4098  ? Factor V  deficiency ([place]) 02/09/2010  ? Osteoporosis 02/09/2010  ? DJD (degenerative joint disease), lumbar 10/14/2009  ? Chronic rhinitis 09/04/2008  ? Glaucoma 01/29/2008  ? Essential hypertension 11/15/2007  ? History of DVT (deep vein thrombosis) 11/15/2007  ? Seasonal and perennial allergic rhinitis 11/15/2007  ? GERD 11/15/2007  ? Upper airway cough syndrome 11/15/2007  ? Chronic cough 11/15/2007  ? ? ?Conditions to be addressed/monitored:CHF and COPD ? ?Care Plan : RNCM COUGH/Heart Failure (Adult)  ?Updates made by Leona Singleton, RN since 05/13/2021 12:00 AM  ?  ? ?Problem: Symptom Exacerbation (Heart Failure/Cough)   ?Priority: Medium  ?  ? ?Long-Range Goal: Symptom Exacerbation Prevented or Minimized   ?Start Date: 03/18/2021  ?Expected End Date: 03/18/2022  ?Recent Progress: On track  ?Priority: Medium  ?Note:   ?Current Barriers:  ?Knowledge deficit related to basic heart failure pathophysiology and self care management of cough/allergies;  patient with history of diastolic dysfunction and chronic lower extremity edema and allergies with chronic cough.  States she does weigh herself daily.   ?Lacks reliable transportation; patient and spouse are no longer able to drive and are dependent upon friends for transportation to medical appointments; no family in town; Care guide referral placed for transportation resources ?3/9--patient reporting frozen food/container fell on here right leg just abone the ankle area a week ago.  Developed large egg sized hematoma.  When hematoma did not go down, patient and caregiver decided tostick needle in hematoma to drain by sticking it with a needle yesterday; did not drain completely but bleeding.  Instructed to not bother wound any longer and to call pcp.  Rncm assisted patient in contacting pcp for appointment asap.  WEIGHT IMCREASED TO 116 POUNDS.  Reports prepackaged meds to start in April.   ?3/23--leg wound has been evaluated by provider.  Patient has completed round of  antibiotics.  Reports wound is still open and draining.  Tries to keep wound clean and dry.  Encouraged patient to contact wound center as soon as possible.  Continues with cough and allergies, has seen aLLERGY sPECIALIST who encouraged to continue with current regime ?Case Manager Clinical Goal(s):  ?patient will weigh self daily and record ?patient will verbalize understanding of Heart Failure Action Plan and when to call doctor ?patient will weigh daily and record (notifying MD of 3 lb weight gain over night or 5 lb in a week) ? ?COUGH/COPD: (Status: Goal on track: NO.) Long Term Goal  ?Reviewed medications with patient, including use of prescribed maintenance and rescue inhalers, and provided instruction on medication management and the importance of adherence ?Advised patient to track and manage COPD triggers ?Provided written and verbal instructions on pursed lip breathing and utilized returned demonstration as teach back ?Advised patient to self assesses COPD action plan zone and make appointment with provider if in the yellow zone for 48 hours without improvement ?Discussed the importance of adequate rest and management of fatigue with COPD  ?Encouraged to continue cough treatment as previously ordered by provider ?limit outside activities ? ?Heart Failure Interventions:  (Status: Goal on Track (progressing): YES.)  Long Term Goal  ?Wt Readings from Last 3 Encounters:  ?05/10/21 123 lb 3.2 oz (55.9 kg)  ?05/04/21 119 lb  3.2 oz (54.1 kg)  ?04/19/21 114 lb 9.6 oz (52 kg)  ?Basic overview and discussion of pathophysiology of Heart Failure reviewed ?Provided education on low sodium diet ?Reviewed Heart Failure Action Plan in depth and provided written copy ?Discussed importance of daily weight and advised patient to weigh and record daily ?Reviewed role of diuretics in prevention of fluid overload and management of heart failure ?Discussed the importance of keeping all appointments with provider  ?Collaboration  with [name], MD regarding development and update of comprehensive plan of care as evidenced by provider attestation and co-signature ?Inter-disciplinary care team collaboration (see longitudinal plan of

## 2021-05-13 NOTE — Patient Instructions (Addendum)
Visit Information ? ?Thank you for taking time to visit with me today. Please don't hesitate to contact me if I can be of assistance to you before our next scheduled telephone appointment. ? ?Following are the goals we discussed today:  ? Call office if I gain more than 2 pounds in one day or 5 pounds in one week ? Keep legs up while sitting ? Use salt in moderation ? Watch for swelling in feet, ankles and legs every day ? Weigh myself daily and record in log, taking log for provider review ? Schedule appointment with WOUND CLINIC ASAP ? Keep wound clean and dry ? ? ?Our next appointment is by telephone on 4/20 at 1000 ? ?Please call the care guide team at [phone] if you need to cancel or reschedule your appointment.  ? ?If you are experiencing a Mental Health or [place] or need someone to talk to, please call the Suicide and Crisis Lifeline: 988 ?call the [place] National Suicide Prevention Lifeline: 985-576-4121 or TTY: (757) 431-1274 TTY 9800687670) to talk to a trained counselor ?call 1-800-273-TALK (toll free, 24 hour hotline) ?go to [facility] [address], [place] [phone]) ?call 911  ? ?Patient verbalizes understanding of instructions and care plan provided today and agrees to view in [place]. Active MyChart status confirmed with patient.   ? ?[name] RN, MSN ?RN Care Management Coordinator  ?[place] ?[phone] ?[email or web address] ? ?

## 2021-05-14 DIAGNOSIS — Z20822 Contact with and (suspected) exposure to covid-19: Secondary | ICD-10-CM | POA: Diagnosis not present

## 2021-05-17 ENCOUNTER — Ambulatory Visit (INDEPENDENT_AMBULATORY_CARE_PROVIDER_SITE_OTHER): Payer: Medicare Other

## 2021-05-17 ENCOUNTER — Other Ambulatory Visit: Payer: Self-pay

## 2021-05-17 ENCOUNTER — Encounter: Payer: Self-pay | Admitting: Cardiovascular Disease

## 2021-05-17 DIAGNOSIS — Z7901 Long term (current) use of anticoagulants: Secondary | ICD-10-CM | POA: Diagnosis not present

## 2021-05-17 DIAGNOSIS — Z20822 Contact with and (suspected) exposure to covid-19: Secondary | ICD-10-CM | POA: Diagnosis not present

## 2021-05-17 LAB — POCT INR: INR: 3.4 — AB (ref 2.0–3.0)

## 2021-05-17 MED ORDER — METOPROLOL TARTRATE 25 MG PO TABS: 25.0000 mg | ORAL_TABLET | Freq: Two times a day (BID) | ORAL | 3 refills | Status: AC

## 2021-05-17 NOTE — Patient Instructions (Signed)
HOLD TODAY ONLY and then Continue to take warfarin 1 tablet daily except for 1/2 a tablet on Monday, Wednesday and Fridays. Recheck INR in 4 weeks.  ?[phone] ?

## 2021-05-19 DIAGNOSIS — Z20822 Contact with and (suspected) exposure to covid-19: Secondary | ICD-10-CM | POA: Diagnosis not present

## 2021-05-21 ENCOUNTER — Other Ambulatory Visit: Payer: Self-pay | Admitting: Family Medicine

## 2021-05-21 ENCOUNTER — Encounter (HOSPITAL_BASED_OUTPATIENT_CLINIC_OR_DEPARTMENT_OTHER): Payer: Medicare Other | Attending: General Surgery | Admitting: General Surgery

## 2021-05-21 DIAGNOSIS — I509 Heart failure, unspecified: Secondary | ICD-10-CM | POA: Diagnosis not present

## 2021-05-21 DIAGNOSIS — I119 Hypertensive heart disease without heart failure: Secondary | ICD-10-CM | POA: Diagnosis not present

## 2021-05-21 DIAGNOSIS — Z7901 Long term (current) use of anticoagulants: Secondary | ICD-10-CM | POA: Insufficient documentation

## 2021-05-21 DIAGNOSIS — L97811 Non-pressure chronic ulcer of other part of right lower leg limited to breakdown of skin: Secondary | ICD-10-CM | POA: Insufficient documentation

## 2021-05-21 DIAGNOSIS — I350 Nonrheumatic aortic (valve) stenosis: Secondary | ICD-10-CM | POA: Insufficient documentation

## 2021-05-21 DIAGNOSIS — Z96651 Presence of right artificial knee joint: Secondary | ICD-10-CM | POA: Insufficient documentation

## 2021-05-21 DIAGNOSIS — J441 Chronic obstructive pulmonary disease with (acute) exacerbation: Secondary | ICD-10-CM

## 2021-05-21 DIAGNOSIS — L97812 Non-pressure chronic ulcer of other part of right lower leg with fat layer exposed: Secondary | ICD-10-CM | POA: Insufficient documentation

## 2021-05-21 DIAGNOSIS — Z86718 Personal history of other venous thrombosis and embolism: Secondary | ICD-10-CM | POA: Insufficient documentation

## 2021-05-21 DIAGNOSIS — I872 Venous insufficiency (chronic) (peripheral): Secondary | ICD-10-CM | POA: Diagnosis not present

## 2021-05-21 DIAGNOSIS — Z87891 Personal history of nicotine dependence: Secondary | ICD-10-CM | POA: Diagnosis not present

## 2021-05-21 NOTE — Progress Notes (Signed)
Robin, Harrell ([id number]) ?Visit Report for 05/21/2021 ?Abuse Risk Screen Details ?Patient Name: Date of Service: ?[facility] RD, [name] 05/21/2021 1:30 PM ?Medical Record Number: [id number] ?Patient Account Number: [id number] ?Date of Birth/Sex: Treating RN: ?1933-09-05 (88 y.o. Robin Harrell, Robin Harrell ?Primary Care Amelda Hapke: Billey Chang Other Clinician: ?Referring Shaden Higley: ?Treating Marquisha Nikolov/Extender: Fredirick Maudlin ?Billey Chang ?Weeks in Treatment: 0 ?Abuse Risk Screen Items ?Answer ?ABUSE RISK SCREEN: ?Has anyone close to you tried to hurt or harm you recentlyo No ?Do you feel uncomfortable with anyone in your familyo No ?Has anyone forced you do things that you didnt want to doo No ?Electronic Signature(s) ?Signed: 05/21/2021 4:27:25 PM By: [name] RN, BSN ?Entered By: [name] on 05/21/2021 14:39:35 ?-------------------------------------------------------------------------------- ?Activities of Daily Living Details ?Patient Name: Date of Service: ?[facility] RD, [name] 05/21/2021 1:30 PM ?Medical Record Number: [id number] ?Patient Account Number: [id number] ?Date of Birth/Sex: Treating RN: ?06-Oct-1933 (88 y.o. Robin Harrell, Robin Harrell ?Primary Care Wilfredo Canterbury: Billey Chang Other Clinician: ?Referring Apostolos Blagg: ?Treating Ciara Kagan/Extender: Fredirick Maudlin ?Billey Chang ?Weeks in Treatment: 0 ?Activities of Daily Living Items ?Answer ?Activities of Daily Living (Please select one for each item) ?Drive Automobile Not Able ?T Medications ?ake Completely Able ?Use T elephone Completely Able ?Care for Appearance Need Assistance ?Use T oilet Need Assistance ?Bath / Shower Need Assistance ?Dress Self Need Assistance ?Feed Self Completely Able ?Walk Need Assistance ?Get In / Out Bed Need Assistance ?Housework Need Assistance ?Prepare Meals Need Assistance ?Handle Money Need Assistance ?Shop for Self Need Assistance ?Electronic Signature(s) ?Signed: 05/21/2021 4:27:25 PM By: [name] RN, BSN ?Entered By:  [name] on 05/21/2021 14:40:14 ?-------------------------------------------------------------------------------- ?Education Screening Details ?Patient Name: ?Date of Service: ?[facility] RD, [name] 05/21/2021 1:30 PM ?Medical Record Number: [id number] ?Patient Account Number: [id number] ?Date of Birth/Sex: ?Treating RN: ?November 04, 1933 (88 y.o. Robin Harrell, Robin Harrell ?Primary Care Juergen Hardenbrook: Billey Chang ?Other Clinician: ?Referring Ulrick Methot: ?Treating Flois Mctague/Extender: Fredirick Maudlin ?Billey Chang ?Weeks in Treatment: 0 ?Primary Learner Assessed: Patient ?Learning Preferences/Education Level/Primary Language ?Learning Preference: Explanation, Demonstration, Printed Material ?Highest Education Level: High School ?Preferred Language: English ?Cognitive Barrier ?Language Barrier: No ?Translator Needed: No ?Memory Deficit: No ?Emotional Barrier: No ?Cultural/Religious Beliefs Affecting Medical Care: No ?Physical Barrier ?Impaired Vision: No ?Impaired Hearing: No ?Decreased Hand dexterity: No ?Knowledge/Comprehension ?Knowledge Level: High ?Comprehension Level: High ?Ability to understand written instructions: High ?Ability to understand verbal instructions: High ?Motivation ?Anxiety Level: Calm ?Cooperation: Cooperative ?Education Importance: Acknowledges Need ?Interest in Health Problems: Asks Questions ?Perception: Coherent ?Willingness to Engage in Self-Management High ?Activities: ?Readiness to Engage in Self-Management High ?Activities: ?Electronic Signature(s) ?Signed: 05/21/2021 4:27:25 PM By: [name] RN, BSN ?Entered By: [name] on 05/21/2021 14:40:33 ?-------------------------------------------------------------------------------- ?Fall Risk Assessment Details ?Patient Name: ?Date of Service: ?[facility] RD, [name] 05/21/2021 1:30 PM ?Medical Record Number: [id number] ?Patient Account Number: [id number] ?Date of Birth/Sex: ?Treating RN: ?08-27-1933 (88 y.o. Robin Harrell, Robin Harrell ?Primary Care Tynesia Harral:  Billey Chang ?Other Clinician: ?Referring Rasha Ibe: ?Treating Dayton Sherr/Extender: Fredirick Maudlin ?Billey Chang ?Weeks in Treatment: 0 ?Fall Risk Assessment Items ?Have you had 2 or more falls in the last 12 monthso 0 Yes ?Have you had any fall that resulted in injury in the last 12 monthso 0 No ?FALLS RISK SCREEN ?History of falling - immediate or within 3 months 0 No ?Secondary diagnosis (Do you have 2 or more medical diagnoseso) 15 Yes ?Ambulatory aid ?None/bed rest/wheelchair/nurse 0 No ?Crutches/cane/walker 15 Yes ?Furniture 0 No ?Intravenous therapy Access/Saline/Heparin Lock 0 No ?Gait/Transferring ?Normal/ bed rest/ wheelchair 0 Yes ?  Weak (short steps with or without shuffle, stooped but able to lift head while walking, may seek 0 No ?support from furniture) ?Impaired (short steps with shuffle, may have difficulty arising from chair, head down, impaired 0 No ?balance) ?Mental Status ?Oriented to own ability 0 Yes ?Electronic Signature(s) ?Signed: 05/21/2021 4:27:25 PM By: [name] RN, BSN ?Entered By: [name] on 05/21/2021 14:40:53 ?-------------------------------------------------------------------------------- ?Foot Assessment Details ?Patient Name: ?Date of Service: ?[facility] RD, [name] 05/21/2021 1:30 PM ?Medical Record Number: [id number] ?Patient Account Number: [id number] ?Date of Birth/Sex: ?Treating RN: ?22-May-1933 (88 y.o. Robin Harrell, Robin Harrell ?Primary Care Isayah Ignasiak: Billey Chang ?Other Clinician: ?Referring Audrie Kuri: ?Treating Tailynn Armetta/Extender: Fredirick Maudlin ?Billey Chang ?Weeks in Treatment: 0 ?Foot Assessment Items ?Site Locations ?+ = Sensation present, - = Sensation absent, C = Callus, U = Ulcer ?R = Redness, W = Warmth, M = Maceration, PU = Pre-ulcerative lesion ?F = Fissure, S = Swelling, D = Dryness ?Assessment ?Right: Left: ?Other Deformity: No No ?Prior Foot Ulcer: No No ?Prior Amputation: No No ?Charcot Joint: No No ?Ambulatory Status: Ambulatory With Help ?Assistance  Device: Walker ?Gait: Steady ?Electronic Signature(s) ?Signed: 05/21/2021 4:27:25 PM By: [name] RN, BSN ?Entered By: [name] on 05/21/2021 14:42:21 ?-------------------------------------------------------------------------------- ?Nutrition Risk Screening Details ?Patient Name: ?Date of Service: ?[facility] RD, [name] 05/21/2021 1:30 PM ?Medical Record Number: [id number] ?Patient Account Number: [id number] ?Date of Birth/Sex: ?Treating RN: ?13-Feb-1934 (88 y.o. Robin Harrell, Robin Harrell ?Primary Care Chasta Deshpande: Billey Chang ?Other Clinician: ?Referring Le Ferraz: ?Treating Keya Wynes/Extender: Fredirick Maudlin ?Billey Chang ?Weeks in Treatment: 0 ?Height (in): 62 ?Weight (lbs): 115 ?Body Mass Index (BMI): 21 ?Nutrition Risk Screening Items ?Score Screening ?NUTRITION RISK SCREEN: ?I have an illness or condition that made me change the kind and/or amount of food I eat 0 No ?I eat fewer than two meals per day 0 No ?I eat few fruits and vegetables, or milk products 0 No ?I have three or more drinks of beer, liquor or wine almost every day 0 No ?I have tooth or mouth problems that make it hard for me to eat 0 No ?I don't always have enough money to buy the food I need 0 No ?I eat alone most of the time 0 No ?I take three or more different prescribed or over-the-counter drugs a day 1 Yes ?Without wanting to, I have lost or gained 10 pounds in the last six months 0 No ?I am not always physically able to shop, cook and/or feed myself 2 Yes ?Nutrition Protocols ?Good Risk Protocol ?Moderate Risk Protocol 0 Provide education on nutrition ?High Risk Proctocol ?Risk Level: Moderate Risk ?Score: 3 ?Electronic Signature(s) ?Signed: 05/21/2021 4:27:25 PM By: [name] RN, BSN ?Entered By: [name] on 05/21/2021 14:41:01 ?

## 2021-05-21 NOTE — Progress Notes (Signed)
Robin Harrell ([id number]) ?Visit Report for 05/21/2021 ?Chief Complaint Document Details ?Patient Name: Date of Service: ?[facility] RD, [name] 05/21/2021 1:30 PM ?Medical Record Number: [id number] ?Patient Account Number: [id number] ?Date of Birth/Sex: Treating RN: ?22-Dec-1933 (88 y.o. Robin Harrell, Robin Harrell ?Primary Care Provider: Billey Harrell Other Clinician: ?Referring Provider: ?Treating Provider/Extender: Robin Harrell ?Robin Harrell ?Weeks in Treatment: 0 ?Information Obtained from: Patient ?Chief Complaint ?Patient presents for treatment of an open ulcer due to venous insufficiency ?Electronic Signature(s) ?Signed: 05/21/2021 3:15:19 PM By: Robin Maudlin MD FACS ?Entered By: Robin Harrell on 05/21/2021 15:15:19 ?-------------------------------------------------------------------------------- ?Debridement Details ?Patient Name: Date of Service: ?[facility] RD, [name] 05/21/2021 1:30 PM ?Medical Record Number: [id number] ?Patient Account Number: [id number] ?Date of Birth/Sex: Treating RN: ?September 27, 1933 (88 y.o. Robin Harrell, Robin Harrell ?Primary Care Provider: Billey Harrell Other Clinician: ?Referring Provider: ?Treating Provider/Extender: Robin Harrell ?Robin Harrell ?Weeks in Treatment: 0 ?Debridement Performed for Assessment: Wound #1 Right,Anterior Lower Leg ?Performed By: Physician Robin Maudlin, MD ?Debridement Type: Debridement ?Severity of Tissue Pre Debridement: Fat layer exposed ?Level of Consciousness (Pre-procedure): Awake and Alert ?Pre-procedure Verification/Time Out Yes - 15:00 ?Taken: ?Start Time: 15:00 ?T Area Debrided (L x W): ?otal 2.6 (cm) x 2.5 (cm) = 6.5 (cm?) ?Tissue and other material debrided: Non-Viable, [place], Subcutaneous, [place] ?Level: Skin/Subcutaneous Tissue ?Debridement Description: Excisional ?Instrument: Curette ?Bleeding: Minimum ?Hemostasis Achieved: Pressure ?End Time: 15:03 ?Procedural Pain: 2 ?Post Procedural Pain: 0 ?Response to Treatment: Procedure was tolerated  well ?Level of Consciousness (Post- Awake and Alert ?procedure): ?Post Debridement Measurements of Total Wound ?Length: (cm) 2.6 ?Width: (cm) 2.5 ?Depth: (cm) 0.1 ?Volume: (cm?) 0.511 ?Character of Wound/Ulcer Post Debridement: Improved ?Severity of Tissue Post Debridement: Fat layer exposed ?Post Procedure Diagnosis ?Same as Pre-procedure ?Electronic Signature(s) ?Signed: 05/21/2021 3:39:29 PM By: Robin Maudlin MD FACS ?Signed: 05/21/2021 4:27:25 PM By: Robin Hurst RN, BSN ?Entered By: Robin Harrell on 05/21/2021 15:01:47 ?-------------------------------------------------------------------------------- ?Debridement Details ?Patient Name: ?Date of Service: ?[facility] RD, [name] 05/21/2021 1:30 PM ?Medical Record Number: [id number] ?Patient Account Number: [id number] ?Date of Birth/Sex: ?Treating RN: ?[birth year] (88 y.o. Robin Harrell, Robin Harrell ?Primary Care Provider: Billey Harrell ?Other Clinician: ?Referring Provider: ?Treating Provider/Extender: Robin Harrell ?Robin Harrell ?Weeks in Treatment: 0 ?Debridement Performed for Assessment: Wound #2 Right,Distal,Anterior Lower Leg ?Performed By: Physician Robin Maudlin, MD ?Debridement Type: Debridement ?Severity of Tissue Pre Debridement: Fat layer exposed ?Level of Consciousness (Pre-procedure): Awake and Alert ?Pre-procedure Verification/Time Out Yes - 15:00 ?Taken: ?Start Time: 15:04 ?T Area Debrided (L x W): ?otal 1.4 (cm) x 1.7 (cm) = 2.38 (cm?) ?Tissue and other material debrided: ?Non-Viable, Skin: Epidermis ?Level: Skin/Epidermis ?Debridement Description: Selective/Open Wound ?Instrument: Forceps, Scissors ?Bleeding: Minimum ?Hemostasis Achieved: Pressure ?End Time: 15:05 ?Procedural Pain: 2 ?Post Procedural Pain: 0 ?Response to Treatment: Procedure was tolerated well ?Level of Consciousness (Post- Awake and Alert ?procedure): ?Post Debridement Measurements of Total Wound ?Length: (cm) 1.4 ?Width: (cm) 1.7 ?Depth: (cm) 0.1 ?Volume: (cm?) 0.187 ?Character of  Wound/Ulcer Post Debridement: Improved ?Severity of Tissue Post Debridement: Fat layer exposed ?Post Procedure Diagnosis ?Same as Pre-procedure ?Electronic Signature(s) ?Signed: 05/21/2021 3:39:29 PM By: Robin Maudlin MD FACS ?Signed: 05/21/2021 4:27:25 PM By: Robin Hurst RN, BSN ?Entered By: Robin Harrell on 05/21/2021 15:05:38 ?-------------------------------------------------------------------------------- ?HPI Details ?Patient Name: ?Date of Service: ?[facility] RD, [name] 05/21/2021 1:30 PM ?Medical Record Number: [id number] ?Patient Account Number: [id number] ?Date of Birth/Sex: Treating RN: ?1933-12-18 (88 y.o. Robin Harrell, Robin Harrell ?Primary Care Provider: Billey Harrell Other Clinician: ?Referring Provider: ?Treating Provider/Extender: Robin Harrell ?Robin Harrell ?Weeks  in Treatment: 0 ?History of Present Illness ?HPI Description: ADMISSION ?05/21/2021: ?This is an 88 year old woman with a fairly unremarkable past medical history aside from essential hypertension, mild aortic stenosis, and mild venous stasis. ?She is not diabetic, nor does she smoke. 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 At the beginning of the month, a frozen dinner fell from the freezer and landed on her right leg. 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 Urgent care placed her on a 5-day course of Keflex, she does have a home health aide that comes 3 ?times a week. They have been applying Vaseline to the site. ABI on the right today was 1.3. In addition to the main wound for which she is being seen, our ?intake nurse noted an area of skin denudement just distal to this main wound. It looks like it may have been a blister initially. The patient has not experienced ?any fevers or chills. She does not have significant pain at the wound site. She says that she does not wear  compression stockings because they are too tight ?and she finds them difficult to don and doff. ?Electronic Signature(s) ?Signed: 05/21/2021 3:24:39 PM By: Robin Maudlin MD FACS ?Entered By: Robin Harrell on 05/21/2021 15:24:39 ?-------------------------------------------------------------------------------- ?Physical Exam Details ?Patient Name: Date of Service: ?[facility] RD, [name] 05/21/2021 1:30 PM ?Medical Record Number: [id number] ?Patient Account Number: [id number] ?Date of Birth/Sex: Treating RN: ?[birth year] (88 y.o. Robin Harrell, Robin Harrell ?Primary Care Provider: Billey Harrell Other Clinician: ?Referring Provider: ?Treating Provider/Extender: Robin Harrell ?Robin Harrell ?Weeks in Treatment: 0 ?Constitutional ?. . . . . No acute distress. ?Respiratory ?Normal work of breathing on room air.Marland Kitchen ?Cardiovascular ?Easily palpable 2+ dorsalis pedis pulses bilaterally.. Bilateral 2+ lower extremity edema, right greater than left.Marland Kitchen ?Notes ?05/21/2021: On the anterior tibial surface of her right leg, there is a circular wound with dark purple nonviable tissue over the entire surface. Just distal to this ?and slightly lateral, there is an area of superficial skin breakdown with loose dead skin and fluid, suggestive of a blister possibly being the etiology. No ?periwound erythema or induration. No significant tenderness. Periwound skin is intact. No odor or purulent drainage. ?Electronic Signature(s) ?Signed: 05/21/2021 3:28:00 PM By: Robin Maudlin MD FACS ?Entered By: Robin Harrell on 05/21/2021 15:27:59 ?-------------------------------------------------------------------------------- ?Physician Orders Details ?Patient Name: Date of Service: ?[facility] RD, [name] 05/21/2021 1:30 PM ?Medical Record Number: [id number] ?Patient Account Number: [id number] ?Date of Birth/Sex: Treating RN: ?[birth year] (88 y.o. Robin Harrell, Robin Harrell ?Primary Care Provider: Billey Harrell Other Clinician: ?Referring Provider: ?Treating  Provider/Extender: Robin Harrell ?Robin Harrell ?Weeks in Treatment: 0 ?Verbal / Phone Orders: No ?Diagnosis Coding ?ICD-10 Coding ?Code Description ?I51.89 Other ill-defined heart diseases ?I87.2 Venous insufficiency (ch

## 2021-05-25 DIAGNOSIS — Z20822 Contact with and (suspected) exposure to covid-19: Secondary | ICD-10-CM | POA: Diagnosis not present

## 2021-05-27 ENCOUNTER — Other Ambulatory Visit: Payer: Self-pay | Admitting: Cardiovascular Disease

## 2021-05-27 DIAGNOSIS — I10 Essential (primary) hypertension: Secondary | ICD-10-CM

## 2021-05-27 DIAGNOSIS — R051 Acute cough: Secondary | ICD-10-CM | POA: Diagnosis not present

## 2021-05-27 DIAGNOSIS — Z20822 Contact with and (suspected) exposure to covid-19: Secondary | ICD-10-CM | POA: Diagnosis not present

## 2021-05-27 DIAGNOSIS — R059 Cough, unspecified: Secondary | ICD-10-CM | POA: Diagnosis not present

## 2021-05-27 NOTE — Progress Notes (Signed)
KIMIKO, COMMON ([id number]) ?Visit Report for 05/21/2021 ?Allergy List Details ?Patient Name: Date of Service: ?[facility] RD, [name] 05/21/2021 1:30 PM ?Medical Record Number: [id number] ?Patient Account Number: [id number] ?Date of Birth/Sex: Treating RN: ?[birth year] (88 y.o. Benjamine Sprague, Shatara ?Primary Care Karuna Balducci: Billey Chang Other Clinician: ?Referring Rene Gonsoulin: ?Treating Glenisha Gundry/Extender: Fredirick Maudlin ?Billey Chang ?Weeks in Treatment: 0 ?Allergies ?Active Allergies ?azithromycin ?Reaction: nausea ?codeine ?Reaction: nausea ?erythromycin base ?Reaction: nausea and vomiting ?Allergy Notes ?Electronic Signature(s) ?Signed: 05/21/2021 4:27:25 PM By: [name] RN, BSN ?Entered By: [name] on 05/21/2021 14:33:57 ?-------------------------------------------------------------------------------- ?Arrival Information Details ?Patient Name: Date of Service: ?[facility] RD, [name] 05/21/2021 1:30 PM ?Medical Record Number: [id number] ?Patient Account Number: [id number] ?Date of Birth/Sex: Treating RN: ?1933-08-15 (88 y.o. Benjamine Sprague, Shatara ?Primary Care Melony Tenpas: Billey Chang Other Clinician: ?Referring Ariez Neilan: ?Treating Keyna Blizard/Extender: Fredirick Maudlin ?Billey Chang ?Weeks in Treatment: 0 ?Visit Information ?Patient Arrived: Kasandra Knudsen ?Arrival Time: 14:11 ?Accompanied By: caregiver ?Transfer Assistance: Manual ?Patient Identification Verified: Yes ?Secondary Verification Process Completed: Yes ?Patient Requires Transmission-Based Precautions: No ?Patient Has Alerts: Yes ?Patient Alerts: Patient on Blood Thinner ?Electronic Signature(s) ?Signed: 05/21/2021 4:27:25 PM By: [name] RN, BSN ?Entered By: [name] on 05/21/2021 14:31:33 ?-------------------------------------------------------------------------------- ?Clinic Level of Care Assessment Details ?Patient Name: Date of Service: ?[facility] RD, [name] 05/21/2021 1:30 PM ?Medical Record Number: [id number] ?Patient Account Number:  [id number] ?Date of Birth/Sex: Treating RN: ?1934-02-09 (88 y.o. Benjamine Sprague, Shatara ?Primary Care Dontavian Marchi: Billey Chang Other Clinician: ?Referring Allisyn Kunz: ?Treating Ruie Sendejo/Extender: Fredirick Maudlin ?Billey Chang ?Weeks in Treatment: 0 ?Clinic Level of Care Assessment Items ?TOOL 1 Quantity Score ?X- 1 0 ?Use when EandM and Procedure is performed on INITIAL visit ?ASSESSMENTS - Nursing Assessment / Reassessment ?X- 1 20 ?General Physical Exam (combine w/ comprehensive assessment (listed just below) when performed on new pt. evals) ?X- 1 25 ?Comprehensive Assessment (HX, ROS, Risk Assessments, Wounds Hx, etc.) ?ASSESSMENTS - Wound and Skin Assessment / Reassessment ?'[]'$  - 0 ?Dermatologic / Skin Assessment (not related to wound area) ?ASSESSMENTS - Ostomy and/or Continence Assessment and Care ?'[]'$  - 0 ?Incontinence Assessment and Management ?'[]'$  - 0 ?Ostomy Care Assessment and Management (repouching, etc.) ?PROCESS - Coordination of Care ?X - Simple Patient / Family Education for ongoing care 1 15 ?'[]'$  - 0 ?Complex (extensive) Patient / Family Education for ongoing care ?X- 1 10 ?Staff obtains Consents, Records, T Results / Process Orders ?est ?'[]'$  - 0 ?Staff telephones HHA, Nursing Homes / Clarify orders / etc ?'[]'$  - 0 ?Routine Transfer to another Facility (non-emergent condition) ?'[]'$  - 0 ?Routine Hospital Admission (non-emergent condition) ?X- 1 15 ?New Admissions / Biomedical engineer / Ordering NPWT Apligraf, etc. ?, ?'[]'$  - 0 ?Emergency Hospital Admission (emergent condition) ?PROCESS - Special Needs ?'[]'$  - 0 ?Pediatric / Minor Patient Management ?'[]'$  - 0 ?Isolation Patient Management ?'[]'$  - 0 ?Hearing / Language / Visual special needs ?'[]'$  - 0 ?Assessment of Community assistance (transportation, D/C planning, etc.) ?'[]'$  - 0 ?Additional assistance / Altered mentation ?'[]'$  - 0 ?Support Surface(s) Assessment (bed, cushion, seat, etc.) ?INTERVENTIONS - Miscellaneous ?'[]'$  - 0 ?External ear exam ?'[]'$  - 0 ?Patient Transfer  (multiple staff / Civil Service fast streamer / Similar devices) ?'[]'$  - 0 ?Simple Staple / Suture removal (25 or less) ?'[]'$  - 0 ?Complex Staple / Suture removal (26 or more) ?'[]'$  - 0 ?Hypo/Hyperglycemic Management (do not check if billed separately) ?X- 1 15 ?Ankle / Brachial Index (ABI) - do not check if billed separately ?Has the patient been seen  at the hospital within the last three years: Yes ?Total Score: 100 ?Level Of Care: New/Established - Level 3 ?Electronic Signature(s) ?Signed: 05/21/2021 4:27:25 PM By: [name] RN, BSN ?Signed: 05/21/2021 4:27:25 PM By: [name] RN, BSN ?Entered By: [name] on 05/21/2021 15:34:33 ?-------------------------------------------------------------------------------- ?Compression Therapy Details ?Patient Name: ?Date of Service: ?[facility] RD, [name] 05/21/2021 1:30 PM ?Medical Record Number: [id number] ?Patient Account Number: [id number] ?Date of Birth/Sex: ?Treating RN: ?1933/04/26 (88 y.o. Benjamine Sprague, Shatara ?Primary Care Aaidyn San: Billey Chang ?Other Clinician: ?Referring Khilee Hendricksen: ?Treating Launi Asencio/Extender: Fredirick Maudlin ?Billey Chang ?Weeks in Treatment: 0 ?Compression Therapy Performed for Wound Assessment: Wound #1 Right,Anterior Lower Leg ?Performed By: Clinician [name], RN ?Compression Type: Three Layer ?Post Procedure Diagnosis ?Same as Pre-procedure ?Electronic Signature(s) ?Signed: 05/21/2021 4:27:25 PM By: [name] RN, BSN ?Entered By: [name] on 05/21/2021 15:06:02 ?-------------------------------------------------------------------------------- ?Compression Therapy Details ?Patient Name: ?Date of Service: ?[facility] RD, [name] 05/21/2021 1:30 PM ?Medical Record Number: [id number] ?Patient Account Number: [id number] ?Date of Birth/Sex: ?Treating RN: ?December 30, 1933 (88 y.o. Benjamine Sprague, Shatara ?Primary Care Falen Lehrmann: Billey Chang ?Other Clinician: ?Referring Loida Calamia: ?Treating Devine Dant/Extender: Fredirick Maudlin ?Billey Chang ?Weeks in Treatment:  0 ?Compression Therapy Performed for Wound Assessment: Wound #2 Right,Distal,Anterior Lower Leg ?Performed By: Clinician [name], RN ?Compression Type: Three Layer ?Post Procedure Diagnosis ?Same as Pre-procedure ?Electronic Signature(s) ?Signed: 05/21/2021 4:27:25 PM By: [name] RN, BSN ?Entered By: [name] on 05/21/2021 15:06:02 ?-------------------------------------------------------------------------------- ?Encounter Discharge Information Details ?Patient Name: ?Date of Service: ?[facility] RD, [name] 05/21/2021 1:30 PM ?Medical Record Number: [id number] ?Patient Account Number: [id number] ?Date of Birth/Sex: ?Treating RN: ?[birth year] (88 y.o. Benjamine Sprague, Shatara ?Primary Care Sullivan Jacuinde: Billey Chang ?Other Clinician: ?Referring Jackilyn Umphlett: ?Treating Xzavior Reinig/Extender: Fredirick Maudlin ?Billey Chang ?Weeks in Treatment: 0 ?Encounter Discharge Information Items Post Procedure Vitals ?Discharge Condition: Stable ?Temperature (F): 98.3 ?Ambulatory Status: Ambulatory ?Pulse (bpm): 85 ?Discharge Destination: Home ?Respiratory Rate (breaths/min): 18 ?Transportation: Private Auto ?Blood Pressure (mmHg): 135/81 ?Accompanied By: caregiver ?Schedule Follow-up Appointment: Yes ?Clinical Summary of Care: Patient Declined ?Electronic Signature(s) ?Signed: 05/21/2021 4:27:25 PM By: [name] RN, BSN ?Entered By: [name] on 05/21/2021 15:35:52 ?-------------------------------------------------------------------------------- ?Lower Extremity Assessment Details ?Patient Name: ?Date of Service: ?[facility] RD, [name] 05/21/2021 1:30 PM ?Medical Record Number: [id number] ?Patient Account Number: [id number] ?Date of Birth/Sex: ?Treating RN: ?Jul 04, 1933 (88 y.o. Benjamine Sprague, Shatara ?Primary Care Faythe Heitzenrater: Billey Chang ?Other Clinician: ?Referring Mandy Fitzwater: ?Treating Burlin Mcnair/Extender: Fredirick Maudlin ?Billey Chang ?Weeks in Treatment: 0 ?Edema Assessment ?Assessed: [Left: No] [Right: No] ?Edema: [Left: Ye]  [Right: s] ?Calf ?Left: Right: ?Point of Measurement: 31 cm From Medial Instep 33 cm ?Ankle ?Left: Right: ?Point of Measurement: 10 cm From Medial Instep 20 cm ?Knee To Floor ?Left: Right: ?From Medial Instep 38

## 2021-05-28 ENCOUNTER — Encounter (HOSPITAL_BASED_OUTPATIENT_CLINIC_OR_DEPARTMENT_OTHER): Payer: Medicare Other | Attending: General Surgery | Admitting: General Surgery

## 2021-05-28 DIAGNOSIS — Z87891 Personal history of nicotine dependence: Secondary | ICD-10-CM | POA: Diagnosis not present

## 2021-05-28 DIAGNOSIS — Z20822 Contact with and (suspected) exposure to covid-19: Secondary | ICD-10-CM | POA: Diagnosis not present

## 2021-05-28 DIAGNOSIS — I82409 Acute embolism and thrombosis of unspecified deep veins of unspecified lower extremity: Secondary | ICD-10-CM | POA: Diagnosis not present

## 2021-05-28 DIAGNOSIS — I872 Venous insufficiency (chronic) (peripheral): Secondary | ICD-10-CM | POA: Diagnosis not present

## 2021-05-28 DIAGNOSIS — L97812 Non-pressure chronic ulcer of other part of right lower leg with fat layer exposed: Secondary | ICD-10-CM | POA: Insufficient documentation

## 2021-05-28 DIAGNOSIS — I5189 Other ill-defined heart diseases: Secondary | ICD-10-CM | POA: Diagnosis not present

## 2021-05-28 DIAGNOSIS — Z86718 Personal history of other venous thrombosis and embolism: Secondary | ICD-10-CM | POA: Insufficient documentation

## 2021-05-31 NOTE — Progress Notes (Signed)
Robin Harrell, Robin Harrell ([id number]) ?Visit Report for 05/28/2021 ?Chief Complaint Document Details ?Patient Name: Date of Service: ?[facility] RD, [name] 05/28/2021 3:15 PM ?Medical Record Number: [id number] ?Patient Account Number: [id number] ?Date of Birth/Sex: Treating RN: ?1933/04/30 (88 y.o. F) Harrell, Robin Claude ?Primary Care Provider: Billey Chang Other Clinician: ?Referring Provider: ?Treating Provider/Extender: Fredirick Maudlin ?Billey Chang ?Weeks in Treatment: 1 ?Information Obtained from: Patient ?Chief Complaint ?Patient presents for treatment of an open ulcer due to venous insufficiency ?Electronic Signature(s) ?Signed: 05/28/2021 4:11:45 PM By: Fredirick Maudlin MD FACS ?Entered By: Fredirick Maudlin on 05/28/2021 16:11:45 ?-------------------------------------------------------------------------------- ?HPI Details ?Patient Name: Date of Service: ?[facility] RD, [name] 05/28/2021 3:15 PM ?Medical Record Number: [id number] ?Patient Account Number: [id number] ?Date of Birth/Sex: Treating RN: ?04-Mar-1933 (88 y.o. F) Harrell, Robin Claude ?Primary Care Provider: Billey Chang Other Clinician: ?Referring Provider: ?Treating Provider/Extender: Fredirick Maudlin ?Billey Chang ?Weeks in Treatment: 1 ?History of Present Illness ?HPI Description: ADMISSION ?05/21/2021: ?This is an 88 year old woman with a fairly unremarkable past medical history aside from essential hypertension, mild aortic stenosis, and mild venous stasis. ?She is not diabetic, nor does she smoke. 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 At the beginning of the month, a frozen dinner fell from the freezer and landed on her right leg. 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 Urgent care placed her on a 5-day course of Keflex, she does have a home health aide that comes 3 ?times a week. They have been applying  Vaseline to the site. ABI on the right today was 1.3. In addition to the main wound for which she is being seen, our ?intake nurse noted an area of skin denudement just distal to this main wound. It looks like it may have been a blister initially. The patient has not experienced ?any fevers or chills. She does not have significant pain at the wound site. She says that she does not wear compression stockings because they are too tight ?and she finds them difficult to don and doff. ?05/28/2021: The small distal wound has closed. The larger proximal wound is extremely clean with good granulation tissue at the base. No purulent drainage or ?slough accumulation. No concern for infection. ?Electronic Signature(s) ?Signed: 05/28/2021 4:12:42 PM By: Fredirick Maudlin MD FACS ?Entered By: Fredirick Maudlin on 05/28/2021 16:12:42 ?-------------------------------------------------------------------------------- ?Physical Exam Details ?Patient Name: Date of Service: ?[facility] RD, [name] 05/28/2021 3:15 PM ?Medical Record Number: [id number] ?Patient Account Number: [id number] ?Date of Birth/Sex: Treating RN: ?09/24/1933 (88 y.o. F) Harrell, Robin Claude ?Primary Care Provider: Billey Chang Other Clinician: ?Referring Provider: ?Treating Provider/Extender: Fredirick Maudlin ?Billey Chang ?Weeks in Treatment: 1 ?Constitutional ?. . . . No acute distress. ?Respiratory ?Normal work of breathing on room air. ?Notes ?05/28/2021: The small distal wound is closed. The wound on the anterior tibial surface of her right leg is significantly cleaner today, without any slough or exudate. ?The surface is viable with good granulation tissue. Periwound skin is intact and edema control is excellent. ?Electronic Signature(s) ?Signed: 05/28/2021 4:14:24 PM By: Fredirick Maudlin MD FACS ?Entered By: Fredirick Maudlin on 05/28/2021 16:14:24 ?-------------------------------------------------------------------------------- ?Physician Orders Details ?Patient Name: Date  of Service: ?[facility] RD, [name] 05/28/2021 3:15 PM ?Medical Record Number: [id number] ?Patient Account Number: [id number] ?Date of Birth/Sex: Treating RN: ?1933-05-04 (88 y.o. 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 - Dr. Celine Ahr ?Return A ?Bathing/ Shower/ Hygiene ?May shower with protection but do not get wound dressing(s) wet. - Ok to use [employer], can buy at [facility], [place], or [place] ?Edema Control - Lymphedema / SCD / Other ?Elevate legs to the level of the heart or above for 30 minutes daily and/or when sitting, a frequency of: - throughout the day ?Avoid standing for long periods of time. ?Exercise regularly ?Wound Treatment ?Wound #1 - Lower Leg Wound Laterality: Right, Anterior ?Cleanser: Soap and Water 1 x Per Week/7 Days ?Discharge Instructions: May shower and wash wound with dial antibacterial soap and water prior to dressing change. ?Cleanser: Wound Cleanser 1 x Per Week/7 Days ?Discharge Instructions: Cleanse the wound with wound cleanser prior to applying a clean dressing using gauze sponges, not tissue or cotton balls. ?Peri-Wound Care: Sween Lotion (Moisturizing lotion) 1 x Per Week/7 Days ?Discharge Instructions: Apply moisturizing lotion as directed ?Prim Dressing: Promogran Prisma Matrix, 4.34 (sq in) (silver collagen) ?ary 1 x Per Week/7 Days ?Discharge Instructions: Moisten collagen with saline or hydrogel ?Secondary Dressing: Zetuvit Plus 4x8 in 1 x Per Week/7 Days ?Discharge  Instructions: Apply over primary dressing as directed. ?Compression Wrap: ThreePress (3 layer compression wrap) 1 x Per Week/7 Days ?Discharge Instructions: Apply three layer compression as directed. ?Compression Stockings: Circaid Juxta Lite Compression Wrap ?Right Leg Compression Amount: 20-30 mmHG ?Discharge Instructions: Apply Circaid Juxta Lite Compression Wrap daily as instructed. Apply first thing in the morning, remove at ?night before bed. ?Electronic Signature(s) ?Signed: 05/28/2021 4:20:58 PM By: Fredirick Maudlin MD FACS ?Entered By: Fredirick Maudlin on 05/28/2021 16:16:44 ?-------------------------------------------------------------------------------- ?Problem List Details ?Patient Name: ?Date of Service: ?[facility] RD, [name] 05/28/2021 3:15 PM ?Medical Record Number: [id number] ?Patient Account Number: [id number] ?Date of Birth/Sex: ?Treating RN: ?30-Oct-1933 (88 y.o. F) Harrell, Robin Claude ?Primary Care Provider: Billey Chang ?Other Clinician: ?Referring Provider: ?Treating Provider/Extender: Fredirick Maudlin ?Billey Chang ?Weeks in Treatment: 1 ?Active Problems ?ICD-10 ?Encounter ?Code Description Active Date MDM ?Diagnosis ?N56.213 Non-pressure chronic ulcer of other part of right lower leg with fat layer 05/21/2021 No Yes ?exposed ?I87.2 Venous insufficiency (chronic) (peripheral) 05/21/2021 No Yes ?Y86.578 Personal history of other venous thrombosis and embolism 05/21/2021 No Yes ?I51.89 Other ill-defined heart diseases 05/21/2021 No Yes ?Inactive Problems ?ICD-10 ?Code Description Active Date Inactive Date ?L97.811 Non-pressure chronic ulcer of other part of right lower leg limited to breakdown of skin 05/21/2021 05/21/2021 ?Resolved Problems ?Electronic Signature(s) ?Signed: 05/28/2021 4:11:18 PM By: Fredirick Maudlin MD FACS ?Entered By: Fredirick Maudlin on 05/28/2021 16:11:18 ?-------------------------------------------------------------------------------- ?Progress Note Details ?Patient Name: Date of  Service: ?[facility] RD, [name] 05/28/2021 3:15 PM ?Medical Record Number: [id number] ?Patient Account Number: [id number] ?Date of Birth/Sex: Treating RN: ?12/08/1933 (88 y.o. F) Harrell, Robin Claude ?Primary Care Provider: And

## 2021-05-31 NOTE — Progress Notes (Signed)
AILENE, ROYAL ([id number]) ?Visit Report for 05/28/2021 ?Arrival Information Details ?Patient Name: Date of Service: ?[facility] RD, [name] 05/28/2021 3:15 PM ?Medical Record Number: [id number] ?Patient Account Number: [id number] ?Date of Birth/Sex: Treating RN: ?09-15-1933 (88 y.o. F) [name], Mechele Claude ?Primary Care Florencio Hollibaugh: Billey Chang Other Clinician: ?Referring Nathanial Arrighi: ?Treating Kwame Ryland/Extender: Fredirick Maudlin ?Billey Chang ?Weeks in Treatment: 1 ?Visit Information History Since Last Visit ?Added or deleted any medications: No ?Patient Arrived: Wheel Chair ?Any new allergies or adverse reactions: No ?Arrival Time: 15:35 ?Had a fall or experienced change in No ?Accompanied By: daughter ?activities of daily living that may affect ?Transfer Assistance: None ?risk of falls: ?Patient Identification Verified: Yes ?Signs or symptoms of abuse/neglect since last visito No ?Secondary Verification Process Completed: Yes ?Hospitalized since last visit: No ?Patient Requires Transmission-Based Precautions: No ?Implantable device outside of the clinic excluding No ?Patient Has Alerts: Yes ?cellular tissue based products placed in the center ?Patient Alerts: Patient on Blood Thinner since last visit: ?Has Dressing in Place as Prescribed: Yes ?Pain Present Now: No ?Electronic Signature(s) ?Signed: 05/31/2021 9:06:42 AM By: [name] ?Entered By: [name] on 05/28/2021 15:36:25 ?-------------------------------------------------------------------------------- ?Compression Therapy Details ?Patient Name: Date of Service: ?[facility] RD, [name] 05/28/2021 3:15 PM ?Medical Record Number: [id number] ?Patient Account Number: [id number] ?Date of Birth/Sex: Treating RN: ?October 05, 1933 (88 y.o. F) [name], Mechele Claude ?Primary Care Arden Axon: Billey Chang Other Clinician: ?Referring Early Steel: ?Treating Jahmarion Popoff/Extender: Fredirick Maudlin ?Billey Chang ?Weeks in Treatment: 1 ?Compression Therapy Performed for Wound Assessment:  Wound #1 Right,Anterior Lower Leg ?Performed By: Clinician [name], RN ?Compression Type: Three Layer ?Post Procedure Diagnosis ?Same as Pre-procedure ?Electronic Signature(s) ?Signed: 05/28/2021 5:20:49 PM By: [name] RN ?Entered By: [name] on 05/28/2021 17:18:44 ?-------------------------------------------------------------------------------- ?Encounter Discharge Information Details ?Patient Name: ?Date of Service: ?[facility] RD, [name] 05/28/2021 3:15 PM ?Medical Record Number: [id number] ?Patient Account Number: [id number] ?Date of Birth/Sex: ?Treating RN: ?05-02-1933 (88 y.o. F) [name], Mechele Claude ?Primary Care Kellyn Mansfield: Billey Chang ?Other Clinician: ?Referring Sagar Tengan: ?Treating Zackaria Burkey/Extender: Fredirick Maudlin ?Billey Chang ?Weeks in Treatment: 1 ?Encounter Discharge Information Items ?Discharge Condition: Stable ?Ambulatory Status: Gilford Rile ?Discharge Destination: Home ?Transportation: Private Auto ?Accompanied By: daughter ?Schedule Follow-up Appointment: Yes ?Clinical Summary of Care: Patient Declined ?Electronic Signature(s) ?Signed: 05/28/2021 5:20:49 PM By: [name] RN ?Entered By: [name] on 05/28/2021 17:20:23 ?-------------------------------------------------------------------------------- ?Lower Extremity Assessment Details ?Patient Name: ?Date of Service: ?[facility] RD, [name] 05/28/2021 3:15 PM ?Medical Record Number: [id number] ?Patient Account Number: [id number] ?Date of Birth/Sex: ?Treating RN: ?April 08, 1933 (88 y.o. F) [name], Mechele Claude ?Primary Care Kaycee Mcgaugh: Billey Chang ?Other Clinician: ?Referring Alezandra Egli: ?Treating Shatasha Lambing/Extender: Fredirick Maudlin ?Billey Chang ?Weeks in Treatment: 1 ?Edema Assessment ?Assessed: [Left: No] [Right: No] ?Edema: [Left: Ye] [Right: s] ?Calf ?Left: Right: ?Point of Measurement: 31 cm From Medial Instep 33 cm ?Ankle ?Left: Right: ?Point of Measurement: 10 cm From Medial Instep 20 cm ?Electronic Signature(s) ?Signed: 05/28/2021  5:20:49 PM By: [name] RN ?Entered By: [name] on 05/28/2021 15:38:47 ?-------------------------------------------------------------------------------- ?Multi Wound Chart Details ?Patient Name: ?Date of Service: ?[facility] RD, [name] 05/28/2021 3:15 PM ?Medical Record Number: [id number] ?Patient Account Number: [id number] ?Date of Birth/Sex: ?Treating RN: ?[name] 03, 1934 (88 y.o. F) [name], Mechele Claude ?Primary Care Brentley Landfair: Billey Chang ?Other Clinician: ?Referring Cortana Vanderford: ?Treating Jlee Harkless/Extender: Fredirick Maudlin ?Billey Chang ?Weeks in Treatment: 1 ?Vital Signs ?Height(in): 62 ?Pulse(bpm): 82 ?Weight(lbs): 115 ?Blood Pressure(mmHg): 103/61 ?Body Mass Index(BMI): 21 ?Temperature(??F): 98.0 ?Respiratory Rate(breaths/min): 18 ?Photos: ?Right, Anterior Lower Leg Right, Anterior Lower Leg Right, Distal, Anterior Lower Leg ?  Wound Location: ?Trauma Trauma Trauma ?Wounding Event: ?Venous Leg Ulcer Venous Leg Ulcer Venous Leg Ulcer ?Primary Etiology: ?Glaucoma, Deep Vein Thrombosis, Glaucoma, Deep Vein Thrombosis, Glaucoma, Deep Vein Thrombosis, ?Comorbid History: ?Hypertension, Peripheral Venous Hypertension, Peripheral Venous Hypertension, Peripheral Venous ?Disease, Osteoarthritis Disease, Osteoarthritis Disease, Osteoarthritis ?04/23/2021 04/23/2021 04/23/2021 ?Date Acquired: ?'1 1 1 '$ ?Weeks of Treatment: ?Open Open Healed - Epithelialized ?Wound Status: ?No No No ?Wound Recurrence: ?2.5x2.3x0.1 2.5x2.3x0.1 0x0x0 ?Measurements L x W x D (cm) ?4.516 4.516 0 ?A (cm?) : ?rea ?0.452 0.452 0 ?Volume (cm?) : ?11.50% 11.50% 100.00% ?% Reduction in Area: ?11.50% 11.50% 100.00% ?% Reduction in Volume: ?Full Thickness Without Exposed Full Thickness Without Exposed Full Thickness Without Exposed ?Classification: ?Support Structures Support Structures Support Structures ?Medium Medium None Present ?Exudate A mount: ?Serosanguineous Serosanguineous N/A ?Exudate Type: ?red, brown red, brown N/A ?Exudate Color: ?Flat and  Intact Flat and Intact Flat and Intact ?Wound Margin: ?Large (67-100%) Medium (34-66%) None Present (0%) ?Granulation Amount: ?Red, Hyper-granulation Red, Pink N/A ?Granulation Quality: ?None Present (0%) Medium (34-66%) None Present (0%) ?Necrotic Amount: ?N/A Eschar, Adherent Slough N/A ?Necrotic Tissue: ?Fat Layer (Subcutaneous Tissue): Yes Fat Layer (Subcutaneous Tissue): Yes Fat Layer (Subcutaneous Tissue): Yes ?Exposed Structures: ?Fascia: No ?Fascia: No ?Fascia: No ?Tendon: No ?Tendon: No ?Tendon: No ?Muscle: No ?Muscle: No ?Muscle: No ?Joint: No ?Joint: No ?Joint: No ?Bone: No ?Bone: No ?Bone: No ?Small (1-33%) None Large (67-100%) ?Epithelialization: ?Treatment Notes ?Electronic Signature(s) ?Signed: 05/28/2021 4:11:38 PM By: Fredirick Maudlin MD FACS ?Signed: 05/28/2021 5:20:49 PM By: [name] RN ?Entered By: Fredirick Maudlin on 05/28/2021 16:11:37 ?-------------------------------------------------------------------------------- ?Multi-Disciplinary Care Plan Details ?Patient Name: ?Date of Service: ?[facility] RD, [name] 05/28/2021 3:15 PM ?Medical Record Number: [id number] ?Patient Account Number: [id number] ?Date of Birth/Sex: ?Treating RN: ?[birth year] (88 y.o. F) [name], Mechele Claude ?Primary Care Christabel Camire: Billey Chang ?Other Clinician: ?Referring Hailea Eaglin: ?Treating Kwabena Strutz/Extender: Fredirick Maudlin ?Billey Chang ?Weeks in Treatment: 1 ?Multidisciplinary Care Plan reviewed with physician ?Active Inactive ?Abuse / Safety / Falls / Self Care Management ?Nursing Diagnoses: ?History of Falls ?Potential for injury related to falls ?Goals: ?Patient will not experience any injury related to falls ?Date Initiated: 05/21/2021 ?Target Resolution Date: 06/18/2021 ?Goal Status: Active ?Patient/caregiver will verbalize/demonstrate measures taken to prevent injury and/or falls ?Date Initiated: 05/21/2021 ?Target Resolution Date: 06/18/2021 ?Goal Status: Active ?Interventions: ?Assess Activities of Daily Living upon  admission and as needed ?Assess fall risk on admission and as needed ?Assess: immobility, friction, shearing, incontinence upon admission and as needed ?Assess impairment of mobility on admission and as needed per po

## 2021-06-01 DIAGNOSIS — Z23 Encounter for immunization: Secondary | ICD-10-CM | POA: Diagnosis not present

## 2021-06-04 ENCOUNTER — Encounter (HOSPITAL_BASED_OUTPATIENT_CLINIC_OR_DEPARTMENT_OTHER): Payer: Medicare Other | Admitting: General Surgery

## 2021-06-04 ENCOUNTER — Telehealth: Payer: Self-pay

## 2021-06-04 DIAGNOSIS — L97812 Non-pressure chronic ulcer of other part of right lower leg with fat layer exposed: Secondary | ICD-10-CM | POA: Diagnosis not present

## 2021-06-04 DIAGNOSIS — Z87891 Personal history of nicotine dependence: Secondary | ICD-10-CM | POA: Diagnosis not present

## 2021-06-04 DIAGNOSIS — Z86718 Personal history of other venous thrombosis and embolism: Secondary | ICD-10-CM | POA: Diagnosis not present

## 2021-06-04 DIAGNOSIS — I82409 Acute embolism and thrombosis of unspecified deep veins of unspecified lower extremity: Secondary | ICD-10-CM | POA: Diagnosis not present

## 2021-06-04 DIAGNOSIS — I5189 Other ill-defined heart diseases: Secondary | ICD-10-CM | POA: Diagnosis not present

## 2021-06-04 DIAGNOSIS — I872 Venous insufficiency (chronic) (peripheral): Secondary | ICD-10-CM | POA: Diagnosis not present

## 2021-06-04 NOTE — Progress Notes (Signed)
Robin Harrell, Robin Harrell ([id number]) ?Visit Report for 06/04/2021 ?Arrival Information Details ?Patient Name: Date of Service: ?[place], [name] 06/04/2021 1:15 PM ?Medical Record Number: [id number] ?Patient Account Number: [id number] ?Date of Birth/Sex: Treating RN: ?1933/06/19 (88 y.o. F) [name], Mechele Claude ?Primary Care Quasim Doyon: Billey Chang Other Clinician: ?Referring Diamante Truszkowski: ?Treating Jemiah Cuadra/Extender: Fredirick Maudlin ?Billey Chang ?Weeks in Treatment: 2 ?Visit Information History Since Last Visit ?Added or deleted any medications: No ?Patient Arrived: Robin Harrell ?Any new allergies or adverse reactions: No ?Arrival Time: 13:54 ?Had a fall or experienced change in No ?Accompanied By: friend ?activities of daily living that may affect ?Transfer Assistance: None ?risk of falls: ?Patient Requires Transmission-Based Precautions: No ?Signs or symptoms of abuse/neglect since last visito No ?Patient Has Alerts: Yes ?Hospitalized since last visit: No ?Patient Alerts: Patient on Blood Thinner ?Implantable device outside of the clinic excluding No ?cellular tissue based products placed in the center ?since last visit: ?Has Dressing in Place as Prescribed: Yes ?Has Compression in Place as Prescribed: Yes ?Pain Present Now: Yes ?Electronic Signature(s) ?Signed: 06/04/2021 5:16:59 PM By: [name] RN ?Entered By: [name] on 06/04/2021 13:54:45 ?-------------------------------------------------------------------------------- ?Compression Therapy Details ?Patient Name: Date of Service: ?[place], [name] 06/04/2021 1:15 PM ?Medical Record Number: [id number] ?Patient Account Number: [id number] ?Date of Birth/Sex: Treating RN: ?[birth year] (88 y.o. F) [name], Mechele Claude ?Primary Care Hansen Carino: Billey Chang Other Clinician: ?Referring Braleigh Massoud: ?Treating Shyia Fillingim/Extender: Fredirick Maudlin ?Billey Chang ?Weeks in Treatment: 2 ?Compression Therapy Performed for Wound Assessment: Wound #1 Right,Anterior Lower Leg ?Performed  By: Clinician [name], RN ?Compression Type: Three Layer ?Post Procedure Diagnosis ?Same as Pre-procedure ?Electronic Signature(s) ?Signed: 06/04/2021 5:16:59 PM By: [name] RN ?Entered By: [name] on 06/04/2021 17:14:22 ?-------------------------------------------------------------------------------- ?Encounter Discharge Information Details ?Patient Name: ?Date of Service: ?[place], [name] 06/04/2021 1:15 PM ?Medical Record Number: [id number] ?Patient Account Number: [id number] ?Date of Birth/Sex: ?Treating RN: ?1933-12-16 (88 y.o. F) [name], Mechele Claude ?Primary Care Parsa Rickett: Billey Chang ?Other Clinician: ?Referring Chena Chohan: ?Treating Prospero Mahnke/Extender: Fredirick Maudlin ?Billey Chang ?Weeks in Treatment: 2 ?Encounter Discharge Information Items ?Discharge Condition: Stable ?Ambulatory Status: Robin Harrell ?Discharge Destination: Home ?Transportation: Private Auto ?Accompanied By: caregiver/friend ?Schedule Follow-up Appointment: Yes ?Clinical Summary of Care: Patient Declined ?Electronic Signature(s) ?Signed: 06/04/2021 5:16:59 PM By: [name] RN ?Entered By: [name] on 06/04/2021 17:15:42 ?-------------------------------------------------------------------------------- ?Lower Extremity Assessment Details ?Patient Name: ?Date of Service: ?[place], [name] 06/04/2021 1:15 PM ?Medical Record Number: [id number] ?Patient Account Number: [id number] ?Date of Birth/Sex: ?Treating RN: ?15-Nov-1933 (88 y.o. F) [name], Mechele Claude ?Primary Care Liah Morr: Billey Chang ?Other Clinician: ?Referring Ahmani Daoud: ?Treating Cartina Brousseau/Extender: Fredirick Maudlin ?Billey Chang ?Weeks in Treatment: 2 ?Edema Assessment ?Assessed: [Left: No] [Right: No] ?Edema: [Left: Ye] [Right: s] ?Calf ?Left: Right: ?Point of Measurement: 31 cm From Medial Instep 33 cm ?Ankle ?Left: Right: ?Point of Measurement: 10 cm From Medial Instep 20 cm ?Knee To Floor ?Left: Right: ?From Medial Instep 46 cm ?Electronic  Signature(s) ?Signed: 06/04/2021 5:16:59 PM By: [name] RN ?Entered By: [name] on 06/04/2021 14:06:34 ?-------------------------------------------------------------------------------- ?Multi Wound Chart Details ?Patient Name: ?Date of Service: ?[place], [name] 06/04/2021 1:15 PM ?Medical Record Number: [id number] ?Patient Account Number: [id number] ?Date of Birth/Sex: ?Treating RN: ?[birth year] (88 y.o. F) ?Primary Care Thersea Manfredonia: Billey Chang ?Other Clinician: ?Referring Dellar Traber: ?Treating Jakylan Ron/Extender: Fredirick Maudlin ?Billey Chang ?Weeks in Treatment: 2 ?Vital Signs ?Height(in): 62 ?Pulse(bpm): 74 ?Weight(lbs): 115 ?Blood Pressure(mmHg): 113/68 ?Body Mass Index(BMI): 21 ?Temperature(??F): 97.5 ?Respiratory Rate(breaths/min): 18 ?Photos: [1:No Photos Right, Anterior Lower Leg] [N/A:N/A  N/A] ?Wound Location: [1:Trauma] [N/A:N/A] ?Wounding Event: [1:Venous Leg Ulcer] [N/A:N/A] ?Primary Etiology: [1:Glaucoma, Deep Vein Thrombosis,] [N/A:N/A] ?Comorbid History: [1:Hypertension, Peripheral Venous Disease, Osteoarthritis 04/23/2021] [N/A:N/A] ?Date Acquired: [1:2] [N/A:N/A] ?Weeks of Treatment: [1:Open] [N/A:N/A] ?Wound Status: [1:No] [N/A:N/A] ?Wound Recurrence: [1:2.5x2.3x0.1] [N/A:N/A] ?Measurements L x W x D (cm) [1:4.516] [N/A:N/A] ?A (cm?) : ?rea [1:0.452] [N/A:N/A] ?Volume (cm?) : [1:11.50%] [N/A:N/A] ?% Reduction in Area: [1:11.50%] [N/A:N/A] ?% Reduction in Volume: [1:Full Thickness Without Exposed] [N/A:N/A] ?Classification: [1:Support Structures Medium] [N/A:N/A] ?Exudate Amount: [1:Serosanguineous] [N/A:N/A] ?Exudate Type: [1:red, brown] [N/A:N/A] ?Exudate Color: [1:Flat and Intact] [N/A:N/A] ?Wound Margin: [1:Large (67-100%)] [N/A:N/A] ?Granulation Amount: [1:Red, Pink] [N/A:N/A] ?Granulation Quality: [1:Small (1-33%)] [N/A:N/A] ?Necrotic Amount: ?[1:Fat Layer (Subcutaneous Tissue): Yes N/A] ?Exposed Structures: ?[1:Fascia: No Tendon: No Muscle: No Joint: No Bone: No Small (1-33%)]  [N/A:N/A] ?Treatment Notes ?Electronic Signature(s) ?Signed: 06/04/2021 2:33:03 PM By: Fredirick Maudlin MD FACS ?Entered By: Fredirick Maudlin on 06/04/2021 14:33:02 ?-------------------------------------------------------------------------------- ?Multi-Disciplinary Care Plan Details ?Patient Name: ?Date of Service: ?[place], [name] 06/04/2021 1:15 PM ?Medical Record Number: [id number] ?Patient Account Number: [id number] ?Date of Birth/Sex: ?Treating RN: ?16-Aug-1933 (88 y.o. F) [name], Mechele Claude ?Primary Care Nicoles Sedlacek: Billey Chang ?Other Clinician: ?Referring Eliceo Gladu: ?Treating Delmus Warwick/Extender: Fredirick Maudlin ?Billey Chang ?Weeks in Treatment: 2 ?Multidisciplinary Care Plan reviewed with physician ?Active Inactive ?Abuse / Safety / Falls / Self Care Management ?Nursing Diagnoses: ?History of Falls ?Potential for injury related to falls ?Goals: ?Patient will not experience any injury related to falls ?Date Initiated: 05/21/2021 ?Target Resolution Date: 06/18/2021 ?Goal Status: Active ?Patient/caregiver will verbalize/demonstrate measures taken to prevent injury and/or falls ?Date Initiated: 05/21/2021 ?Target Resolution Date: 06/18/2021 ?Goal Status: Active ?Interventions: ?Assess Activities of Daily Living upon admission and as needed ?Assess fall risk on admission and as needed ?Assess: immobility, friction, shearing, incontinence upon admission and as needed ?Assess impairment of mobility on admission and as needed per policy ?Assess personal safety and home safety (as indicated) on admission and as needed ?Assess self care needs on admission and as needed ?Provide education on fall prevention ?Provide education on personal and home safety ?Notes: ?Venous Leg Ulcer ?Nursing Diagnoses: ?Knowledge deficit related to disease process and management ?Goals: ?Patient will maintain optimal edema control ?Date Initiated: 05/21/2021 ?Target Resolution Date: 06/18/2021 ?Goal Status: Active ?Patient/caregiver will  verbalize understanding of disease process and disease management ?Date Initiated: 05/21/2021 ?Target Resolution Date: 06/18/2021 ?Goal Status: Active ?Interventions: ?Assess peripheral edema status every visit. ?Compression

## 2021-06-04 NOTE — Telephone Encounter (Signed)
Last Prolia inj 11/04/20 ?Next Prolia inj due 05/05/21 ?

## 2021-06-04 NOTE — Progress Notes (Signed)
Robin Harrell, Robin Harrell ([id number]) ?Visit Report for 06/04/2021 ?Chief Complaint Document Details ?Patient Name: Date of Service: ?[place], Robin Harrell. 06/04/2021 1:15 PM ?Medical Record Number: [id number] ?Patient Account Number: [id number] ?Date of Birth/Sex: Treating RN: ?06/08/1933 (88 y.o. F) ?Primary Care Provider: Billey Harrell Other Clinician: ?Referring Provider: ?Treating Provider/Extender: Fredirick Maudlin ?Robin Harrell ?Weeks in Treatment: 2 ?Information Obtained from: Patient ?Chief Complaint ?Patient presents for treatment of an open ulcer due to venous insufficiency ?Electronic Signature(s) ?Signed: 06/04/2021 2:33:28 PM By: Fredirick Maudlin MD Harrell ?Entered By: Fredirick Maudlin on 06/04/2021 14:33:28 ?-------------------------------------------------------------------------------- ?HPI Details ?Patient Name: Date of Service: ?Robin Harrell, Robin Harrell. 06/04/2021 1:15 PM ?Medical Record Number: [id number] ?Patient Account Number: [id number] ?Date of Birth/Sex: Treating RN: ?1933-09-02 (88 y.o. F) ?Primary Care Provider: Billey Harrell Other Clinician: ?Referring Provider: ?Treating Provider/Extender: Fredirick Maudlin ?Robin Harrell ?Weeks in Treatment: 2 ?History of Present Illness ?HPI Description: ADMISSION ?05/21/2021: ?This is an 88 year old woman with a fairly unremarkable past medical history aside from essential hypertension, mild aortic stenosis, and mild venous stasis. ?She is not diabetic, nor does she smoke. 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 At the beginning of the month, a frozen dinner fell from the freezer and landed on her right leg. 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 Urgent care placed her on a 5-day course of Keflex, she does have a home health aide that comes 3 ?times a week. They have been applying Vaseline to the site. ABI on  the right today was 1.3. In addition to the main wound for which she is being seen, our ?intake nurse noted an area of skin denudement just distal to this main wound. It looks like it may have been a blister initially. The patient has not experienced ?any fevers or chills. She does not have significant pain at the wound site. She says that she does not wear compression stockings because they are too tight ?and she finds them difficult to don and doff. ?05/28/2021: The small distal wound has closed. The larger proximal wound is extremely clean with good granulation tissue at the base. No purulent drainage or ?slough accumulation. No concern for infection. ?06/04/2021: Although we had discussed ordering juxta lite stockings, it does not appear that these ever were ordered and they certainly have not arrived at the ?patient's residence. The wound is quite a bit smaller today with good granulation tissue at the base; it is nearly flush with the surrounding skin. ?Electronic Signature(s) ?Signed: 06/04/2021 2:34:16 PM By: Fredirick Maudlin MD Harrell ?Entered By: Fredirick Maudlin on 06/04/2021 14:34:16 ?-------------------------------------------------------------------------------- ?Physical Exam Details ?Patient Name: Date of Service: ?Robin Harrell, Robin Harrell. 06/04/2021 1:15 PM ?Medical Record Number: [id number] ?Patient Account Number: [id number] ?Date of Birth/Sex: Treating RN: ?Jun 14, 1933 (88 y.o. F) ?Primary Care Provider: Billey Harrell Other Clinician: ?Referring Provider: ?Treating Provider/Extender: Fredirick Maudlin ?Robin Harrell ?Weeks in Treatment: 2 ?Constitutional ?. . . . No acute distress. ?Respiratory ?Normal work of breathing on room air. ?Notes ?06/04/2021: The anterior tibial wound is quite a bit smaller today and very clean without any slough or exudate. There is good granulation tissue on the surface ?and the wound is nearly flush with the surrounding skin. Edema control is excellent. ?Electronic  Signature(s) ?Signed: 06/04/2021 2:35:26 PM By: Fredirick Maudlin MD Harrell ?Entered By: Fredirick Maudlin on 06/04/2021 14:35:26 ?-------------------------------------------------------------------------------- ?Physician Orders Details ?  Patient Name: Date of Service: ?Robin Harrell, Robin Harrell. 06/04/2021 1:15 PM ?Medical Record Number: [id number] ?Patient Account Number: [id number] ?Date of Birth/Sex: Treating RN: ?Apr 28, 1933 (88 y.o. F) Robin, Mechele Harrell ?Primary Care Provider: Billey Harrell Other Clinician: ?Referring Provider: ?Treating Provider/Extender: Fredirick Maudlin ?Robin Harrell ?Weeks in Treatment: 2 ?Verbal / Phone Orders: No ?Diagnosis Coding ?ICD-10 Coding ?Code Description ?A41.660 Non-pressure chronic ulcer of other part of right lower leg with fat layer exposed ?I87.2 Venous insufficiency (chronic) (peripheral) ?Y30.160 Personal history of other venous thrombosis and embolism ?I51.89 Other ill-defined heart diseases ?Follow-up Appointments ?ppointment in 1 week. - Dr. Celine Ahr room 3 ?Return A ?Bathing/ Shower/ Hygiene ?May shower with protection but do not get wound dressing(s) wet. - Ok to use [employer], can buy at [facility], [place], or [place] ?Edema Control - Lymphedema / SCD / Other ?Elevate legs to the level of the heart or above for 30 minutes daily and/or when sitting, a frequency of: - throughout the day ?Avoid standing for long periods of time. ?Exercise regularly ?Wound Treatment ?Wound #1 - Lower Leg Wound Laterality: Right, Anterior ?Cleanser: Soap and Water 1 x Per Week/7 Days ?Discharge Instructions: May shower and wash wound with dial antibacterial soap and water prior to dressing change. ?Cleanser: Wound Cleanser 1 x Per Week/7 Days ?Discharge Instructions: Cleanse the wound with wound cleanser prior to applying a clean dressing using gauze sponges, not tissue or cotton balls. ?Peri-Wound Care: Sween Lotion (Moisturizing lotion) 1 x Per Week/7 Days ?Discharge Instructions: Apply moisturizing  lotion as directed ?Prim Dressing: Promogran Prisma Matrix, 4.34 (sq in) (silver collagen) 1 x Per Week/7 Days ?ary ?Discharge Instructions: Moisten collagen with saline or hydrogel ?Secondary Dressing: Zetuvit Plus 4x8 in 1 x Per Week/7 Days ?Discharge Instructions: Apply over primary dressing as directed. ?Compression Wrap: ThreePress (3 layer compression wrap) 1 x Per Week/7 Days ?Discharge Instructions: Apply three layer compression as directed. ?Compression Stockings: Circaid Juxta Lite Compression Wrap (DME) ?Right Leg Compression Amount: 20-30 mmHG ?Discharge Instructions: Apply Circaid Juxta Lite Compression Wrap daily as instructed. Apply first thing in the morning, remove at ?night before bed. ?Electronic Signature(s) ?Signed: 06/04/2021 5:31:06 PM By: Fredirick Maudlin MD Harrell ?Entered By: Fredirick Maudlin on 06/04/2021 14:35:51 ?-------------------------------------------------------------------------------- ?Problem List Details ?Patient Name: ?Date of Service: ?Robin Harrell, Robin Harrell. 06/04/2021 1:15 PM ?Medical Record Number: [id number] ?Patient Account Number: [id number] ?Date of Birth/Sex: ?Treating RN: ?10/25/1933 (88 y.o. F) ?Primary Care Provider: Billey Harrell ?Other Clinician: ?Referring Provider: ?Treating Provider/Extender: Fredirick Maudlin ?Robin Harrell ?Weeks in Treatment: 2 ?Active Problems ?ICD-10 ?Encounter ?Code Description Active Date MDM ?Diagnosis ?D22.025 Non-pressure chronic ulcer of other part of right lower leg with fat layer 05/21/2021 No Yes ?exposed ?I87.2 Venous insufficiency (chronic) (peripheral) 05/21/2021 No Yes ?K27.062 Personal history of other venous thrombosis and embolism 05/21/2021 No Yes ?I51.89 Other ill-defined heart diseases 05/21/2021 No Yes ?Inactive Problems ?ICD-10 ?Code Description Active Date Inactive Date ?L97.811 Non-pressure chronic ulcer of other part of right lower leg limited to breakdown of skin 05/21/2021 05/21/2021 ?Resolved Problems ?Electronic  Signature(s) ?Signed: 06/04/2021 2:32:18 PM By: Fredirick Maudlin MD Harrell ?Entered By: Fredirick Maudlin on 06/04/2021 14:32:18 ?-------------------------------------------------------------------------------- ?Progress Note Details ?

## 2021-06-07 DIAGNOSIS — Z20822 Contact with and (suspected) exposure to covid-19: Secondary | ICD-10-CM | POA: Diagnosis not present

## 2021-06-07 NOTE — Telephone Encounter (Addendum)
Patient has called in.    Would like to know if ok to schedule?  Patient can be reached at [phone].

## 2021-06-08 NOTE — Telephone Encounter (Signed)
Pt ready for scheduling on or after 05/05/21 ? ?Out-of-pocket cost due at time of visit: $\0 ? ?Primary: Medicare ?Prolia co-insurance: 20% (approximately $276) ?Admin fee co-insurance: 20% (approximately $25) ? ?Secondary: BCBS Medicare Supp ?Prolia co-insurance: covers the Medicare Part B ?deductible, co-insurance and 100% of the excess charges ?Admin fee co-insurance: covers the Medicare Part B ?deductible, co-insurance and 100% of the excess charges ? ?Deductible: covered by secondary ? ?Prior Auth: not required ?PA# ?Valid:  ? ?** This summary of benefits is an estimation of the patient's out-of-pocket cost. Exact cost may vary based on individual plan coverage.  ? ?

## 2021-06-10 ENCOUNTER — Telehealth: Payer: Self-pay | Admitting: Family Medicine

## 2021-06-10 ENCOUNTER — Ambulatory Visit (INDEPENDENT_AMBULATORY_CARE_PROVIDER_SITE_OTHER): Payer: Medicare Other | Admitting: *Deleted

## 2021-06-10 DIAGNOSIS — I10 Essential (primary) hypertension: Secondary | ICD-10-CM

## 2021-06-10 DIAGNOSIS — S81801D Unspecified open wound, right lower leg, subsequent encounter: Secondary | ICD-10-CM

## 2021-06-10 DIAGNOSIS — I5189 Other ill-defined heart diseases: Secondary | ICD-10-CM

## 2021-06-10 NOTE — Patient Instructions (Addendum)
Visit Information ? ?Thank you for taking time to visit with me today. Please don't hesitate to contact me if I can be of assistance to you before our next scheduled telephone appointment. ? ?Following are the goals we discussed today:  ? Call office if I gain more than 2 pounds in one day or 5 pounds in one week ? Keep legs up while sitting ? Use salt in moderation ? Watch for swelling in feet, ankles and legs every day ? Weigh myself daily and record in log, taking log for provider review ? Keep wound clean and dry ? elevate of lower extremities ? Continue to seek dressing changes from Wound Clinic ? ?Our next appointment is by telephone on 5/16 at 1130 ? ?Please call the care guide team at [phone] if you need to cancel or reschedule your appointment.  ? ?If you are experiencing a Mental Health or [place] or need someone to talk to, please call the Suicide and Crisis Lifeline: 988 ?call the [place] National Suicide Prevention Lifeline: 431-047-2599 or TTY: 616-325-3423 TTY (410)502-4772) to talk to a trained counselor ?call 1-800-273-TALK (toll free, 24 hour hotline) ?call 911  ? ?Patient verbalizes understanding of instructions and care plan provided today and agrees to view in Agency. Active MyChart status confirmed with patient.   ? ?[name] RN, MSN ?RN Care Management Coordinator ?[place] ?[phone] ?[email or web address] ? ?

## 2021-06-10 NOTE — Telephone Encounter (Signed)
Pt is scheduled for prolia tomorrow 06/10/21 at 1pm.    Will there be a copay?

## 2021-06-10 NOTE — Telephone Encounter (Signed)
I have her Prolia in the refridg at Dr Andy's station. If pt comes ? ?

## 2021-06-10 NOTE — Telephone Encounter (Signed)
error 

## 2021-06-10 NOTE — Telephone Encounter (Signed)
Pt stated she was instructed to call [facility] to confirm supply of Prolia injection.  ? ?Do we have supply? ? ?If present, pt would like to schedule an appointment for 04/21. ?

## 2021-06-10 NOTE — Chronic Care Management (AMB) (Signed)
?Chronic Care Management  ? ?CCM RN Visit Note ? ?06/10/2021 ?Name: Robin Harrell MRN: [id number] DOB: September 29, 1933 ? ?Subjective: ?Robin Harrell is a 88 y.o. year old female who is a primary care patient of [name], MD. The care management team was consulted for assistance with disease management and care coordination needs.   ? ?Engaged with patient by telephone for follow up visit in response to provider referral for case management and/or care coordination services.  ? ?Consent to Services:  ?The patient was given the following information about Chronic Care Management services today, agreed to services, and gave verbal consent: 1. CCM service includes personalized support from designated clinical staff supervised by the primary care provider, including individualized plan of care and coordination with other care providers 2. 24/7 contact phone numbers for assistance for urgent and routine care needs. 3. Service will only be billed when office clinical staff spend 20 minutes or more in a month to coordinate care. 4. Only one practitioner may furnish and bill the service in a calendar month. 5.The patient may stop CCM services at any time (effective at the end of the month) by phone call to the office staff. 6. The patient will be responsible for cost sharing (co-pay) of up to 20% of the service fee (after annual deductible is met). Patient agreed to services and consent obtained. ? ?Patient agreed to services and verbal consent obtained.  ? ?Assessment: Review of patient past medical history, allergies, medications, health status, including review of consultants reports, laboratory and other test data, was performed as part of comprehensive evaluation and provision of chronic care management services.  ? ?SDOH (Social Determinants of Health) assessments and interventions performed:   ? ?CCM Care Plan ? ?Allergies  ?Allergen Reactions  ? Azithromycin Nausea Only  ? Codeine Nausea Only  ? Erythromycin  Nausea And Vomiting  ? ? ?Outpatient Encounter Medications as of 06/10/2021  ?Medication Sig  ? acetaminophen (TYLENOL) 500 MG tablet Take 1,000 mg by mouth every 6 (six) hours as needed for moderate pain.  ? atorvastatin (LIPITOR) 10 MG tablet TAKE 1 TABLET (10 MG TOTAL) BY MOUTH DAILY AT 6 PM.  ? azelastine (ASTELIN) 0.1 % nasal spray Place 2 sprays into both nostrils 2 (two) times daily. Use in each nostril as directed  ? brimonidine (ALPHAGAN) 0.15 % ophthalmic solution Place 2 drops into both eyes 2 (two) times daily.   ? brimonidine (ALPHAGAN) 0.2 % ophthalmic solution PLACE 1 DROP INTO BOTH EYES TWICE DAILY.  ? Calcium-Phosphorus-Vitamin D (CALCIUM/D3 ADULT GUMMIES) 200-96.6-200 MG-MG-UNIT CHEW Chew 2 tablets by mouth daily.  ? fluticasone (FLONASE) 50 MCG/ACT nasal spray SPRAY 2 SPRAYS INTO EACH NOSTRIL EVERY DAY  ? latanoprost (XALATAN) 0.005 % ophthalmic solution Place 1 drop into both eyes at bedtime.   ? levocetirizine (XYZAL) 5 MG tablet TAKE 1 TABLET BY MOUTH EVERY DAY IN THE EVENING  ? levothyroxine (SYNTHROID) 25 MCG tablet TAKE 1 TABLET BY MOUTH DAILY BEFORE BREAKFAST.  ? losartan (COZAAR) 50 MG tablet TAKE 1 TABLET BY MOUTH EVERY DAY  ? metoprolol tartrate (LOPRESSOR) 25 MG tablet Take 1 tablet (25 mg total) by mouth 2 (two) times daily.  ? multivitamin-iron-minerals-folic acid (CENTRUM) chewable tablet Chew 1 tablet by mouth daily.  ? promethazine-dextromethorphan (PROMETHAZINE-DM) 6.25-15 MG/5ML syrup Take 2.5 mLs by mouth 4 (four) times daily as needed for cough. (Patient not taking: Reported on 04/19/2021)  ? vitamin E 1000 UNIT capsule Take 1,000 Units by mouth daily.  ?  warfarin (COUMADIN) 7.5 MG tablet TAKE 1/2 TO 1 TABLET BY MOUTH DAILY AS DIRECTED  ? ?No facility-administered encounter medications on file as of 06/10/2021.  ? ? ?Patient Active Problem List  ? Diagnosis Date Noted  ? Bronchitis 03/19/2021  ? Urinary frequency 01/22/2021  ? Traumatic coccydynia 11/04/2020  ? Primary  osteoarthritis of both knees 11/18/2019  ? 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 Chronic venous stasis dermatitis of both lower extremities 12/31/2018  ? Aortic stenosis, mild 02/07/2017  ? History of recurrent UTIs 01/26/2017  ? Chronic idiopathic thrombocytopenia ([place]) 04/14/2016  ? Essential tremor 09/03/2015  ? History of respiratory failure 11/11/2014  ? Hypothyroidism (acquired) 09/02/2014  ? Edema of right lower extremity 02/01/2013  ? Mixed hyperlipidemia 11/19/2012  ? Colon polyp 10/15/2012  ? Long term (current) use of anticoagulants 05/10/2012  ? Insomnia 10/11/2011  ? Osteoarthritis, multiple sites 06/30/2011  ? Diastolic dysfunction 65/99/3570  ? Factor V deficiency ([place]) 02/09/2010  ? Osteoporosis 02/09/2010  ? DJD (degenerative joint disease), lumbar 10/14/2009  ? Chronic rhinitis 09/04/2008  ? Glaucoma 01/29/2008  ? Essential hypertension 11/15/2007  ? History of DVT (deep vein thrombosis) 11/15/2007  ? Seasonal and perennial allergic rhinitis 11/15/2007  ? GERD 11/15/2007  ? Upper airway cough syndrome 11/15/2007  ? Chronic cough 11/15/2007  ? ? ?Conditions to be addressed/monitored:CHF and leg wound ? ?Care Plan : RNCM COUGH/Heart Failure (Adult)  ?Updates made by Leona Singleton, RN since 06/10/2021 12:00 AM  ?  ? ?Problem: Symptom Exacerbation (Heart Failure/Cough)   ?Priority: Medium  ?  ? ?Long-Range Goal: Symptom Exacerbation Prevented or Minimized   ?Start Date: 03/18/2021  ?Expected End Date: 03/18/2022  ?Recent Progress: On track  ?Priority: Medium  ?Note:   ?Current Barriers:  ?Knowledge deficit related to basic heart failure pathophysiology and self care management of cough/allergies;  patient with history of diastolic dysfunction and chronic lower extremity edema and allergies with chronic cough.  States she does weigh herself daily.   ?Lacks reliable transportation; patient and spouse are no longer able to drive and are dependent upon friends for transportation to medical  appointments; no family in town; Care guide referral placed for transportation resources ?3/9--patient reporting frozen food/container fell on here right leg just abone the ankle area a week ago.  Developed large egg sized hematoma.  When hematoma did not go down, patient and caregiver decided tostick needle in hematoma to drain by sticking it with a needle yesterday; did not drain completely but bleeding.  Instructed to not bother wound any longer and to call pcp.  Rncm assisted patient in contacting pcp for appointment asap.  WEIGHT IMCREASED TO 116 POUNDS.  Reports prepackaged meds to start in April.   ?3/23--leg wound has been evaluated by provider.  Patient has completed round of antibiotics.  Reports wound is still open and draining.  Tries to keep wound clean and dry.  Encouraged patient to contact wound center as soon as possible.  Continues with cough and allergies, has seen aLLERGY sPECIALIST who encouraged to continue with current regime ?4/20--Reports still going to wound center for leg wound dressing changes, less painful.  States cough is much better.  Denies any chest pain or increase in SOB.  Does report some lower extremity edema.  Weight is 112.4 pounds. ?Case Manager Clinical Goal(s):  ?patient will weigh self daily and record ?patient will verbalize understanding of Heart Failure Action Plan and when to call doctor ?patient will weigh daily and record (notifying MD  of 3 lb weight gain over night or 5 lb in a week) ? ?COUGH/COPD: (Status: Goal on track: NO.) Long Term Goal  ?Reviewed medications with patient, including use of prescribed maintenance and rescue inhalers, and provided instruction on medication management and the importance of adherence ?Advised patient to track and manage COPD triggers ?Provided written and verbal instructions on pursed lip breathing and utilized returned demonstration as teach back ?Advised patient to self assesses COPD action plan zone and make appointment with  provider if in the yellow zone for 48 hours without improvement ?Discussed the importance of adequate rest and management of fatigue with COPD  ?Discussed limit outside activities ? ?Heart Failure Interventions:  (Status: G

## 2021-06-11 ENCOUNTER — Ambulatory Visit (INDEPENDENT_AMBULATORY_CARE_PROVIDER_SITE_OTHER): Payer: Medicare Other

## 2021-06-11 ENCOUNTER — Encounter (HOSPITAL_BASED_OUTPATIENT_CLINIC_OR_DEPARTMENT_OTHER): Payer: Medicare Other | Admitting: General Surgery

## 2021-06-11 ENCOUNTER — Telehealth: Payer: Self-pay | Admitting: Family Medicine

## 2021-06-11 DIAGNOSIS — M810 Age-related osteoporosis without current pathological fracture: Secondary | ICD-10-CM | POA: Diagnosis not present

## 2021-06-11 DIAGNOSIS — I872 Venous insufficiency (chronic) (peripheral): Secondary | ICD-10-CM | POA: Diagnosis not present

## 2021-06-11 DIAGNOSIS — L97812 Non-pressure chronic ulcer of other part of right lower leg with fat layer exposed: Secondary | ICD-10-CM | POA: Diagnosis not present

## 2021-06-11 DIAGNOSIS — Z87891 Personal history of nicotine dependence: Secondary | ICD-10-CM | POA: Diagnosis not present

## 2021-06-11 DIAGNOSIS — I82409 Acute embolism and thrombosis of unspecified deep veins of unspecified lower extremity: Secondary | ICD-10-CM | POA: Diagnosis not present

## 2021-06-11 DIAGNOSIS — Z86718 Personal history of other venous thrombosis and embolism: Secondary | ICD-10-CM | POA: Diagnosis not present

## 2021-06-11 DIAGNOSIS — I5189 Other ill-defined heart diseases: Secondary | ICD-10-CM | POA: Diagnosis not present

## 2021-06-11 MED ORDER — DENOSUMAB 60 MG/ML ~~LOC~~ SOSY
60.0000 mg | PREFILLED_SYRINGE | Freq: Once | SUBCUTANEOUS | Status: AC
  Administered 2021-06-11: 60 mg via SUBCUTANEOUS

## 2021-06-11 NOTE — Progress Notes (Signed)
Robin Harrell 88 yr old female presents to office for 6 month prolia injection per Billey Chang, MD. Administered DENOSUMAB 60 mg/mL subcutaneous left arm. Patient tolerated well.  ?

## 2021-06-11 NOTE — Progress Notes (Signed)
Robin Harrell Harrell, Robin Harrell Harrell ([id number]) ?Visit Report for 06/11/2021 ?Arrival Information Details ?Patient Name: Date of Service: ?[place], Robin Harrell NNIS J. 06/11/2021 1:15 PM ?Medical Record Number: [id number] ?Patient Account Number: [id number] ?Date of Birth/Sex: Treating RN: ?August 05, 1933 (88 y.o. F) [name], Mechele Claude ?Primary Care Yvett Rossel: Billey Chang Other Clinician: ?Referring Christine Schiefelbein: ?Treating Yvette Roark/Extender: Fredirick Maudlin ?Billey Chang ?Weeks in Treatment: 3 ?Visit Information History Since Last Visit ?Added or deleted any medications: No ?Patient Arrived: Robin Harrell Harrell ?Any new allergies or adverse reactions: No ?Arrival Time: 13:19 ?Had a fall or experienced change in No ?Accompanied By: daughter ?activities of daily living that may affect ?Transfer Assistance: None ?risk of falls: ?Patient Identification Verified: Yes ?Signs or symptoms of abuse/neglect since last visito No ?Patient Requires Transmission-Based Precautions: No ?Hospitalized since last visit: No ?Patient Has Alerts: Yes ?Implantable device outside of the clinic excluding No ?Patient Alerts: Patient on Blood Thinner ?cellular tissue based products placed in the center ?since last visit: ?Has Dressing in Place as Prescribed: Yes ?Pain Present Now: No ?Electronic Signature(s) ?Signed: 06/11/2021 5:25:37 PM By: [name] RN ?Entered By: [name] on 06/11/2021 13:27:45 ?-------------------------------------------------------------------------------- ?Compression Therapy Details ?Patient Name: Date of Service: ?[place], Robin Harrell NNIS J. 06/11/2021 1:15 PM ?Medical Record Number: [id number] ?Patient Account Number: [id number] ?Date of Birth/Sex: Treating RN: ?17-Nov-1933 (89 y.o. F) [name], Mechele Claude ?Primary Care Kimberli Winne: Billey Chang Other Clinician: ?Referring Chelsei Mcchesney: ?Treating Michaelanthony Kempton/Extender: Fredirick Maudlin ?Billey Chang ?Weeks in Treatment: 3 ?Compression Therapy Performed for Wound Assessment: Wound #1 Right,Anterior Lower Leg ?Performed By:  Clinician [name], RN ?Compression Type: Three Layer ?Post Procedure Diagnosis ?Same as Pre-procedure ?Electronic Signature(s) ?Signed: 06/11/2021 5:25:37 PM By: [name] RN ?Entered By: [name] on 06/11/2021 13:56:10 ?-------------------------------------------------------------------------------- ?Encounter Discharge Information Details ?Patient Name: ?Date of Service: ?[place], Robin Harrell NNIS J. 06/11/2021 1:15 PM ?Medical Record Number: [id number] ?Patient Account Number: [id number] ?Date of Birth/Sex: ?Treating RN: ?1933-09-16 (88 y.o. F) [name], Mechele Claude ?Primary Care Haliey Romberg: Billey Chang ?Other Clinician: ?Referring Nicholaos Schippers: ?Treating Bea Duren/Extender: Fredirick Maudlin ?Billey Chang ?Weeks in Treatment: 3 ?Encounter Discharge Information Items ?Discharge Condition: Stable ?Ambulatory Status: Wheelchair ?Discharge Destination: Home ?Transportation: Private Auto ?Accompanied By: Daughter ?Schedule Follow-up Appointment: Yes ?Clinical Summary of Care: Patient Declined ?Electronic Signature(s) ?Signed: 06/11/2021 5:25:37 PM By: [name] RN ?Entered By: [name] on 06/11/2021 17:23:18 ?-------------------------------------------------------------------------------- ?Lower Extremity Assessment Details ?Patient Name: ?Date of Service: ?[place], Robin Harrell NNIS J. 06/11/2021 1:15 PM ?Medical Record Number: [id number] ?Patient Account Number: [id number] ?Date of Birth/Sex: ?Treating RN: ?[birth year] (88 y.o. F) [name], Mechele Claude ?Primary Care Brittane Grudzinski: Billey Chang ?Other Clinician: ?Referring Josslyn Ciolek: ?Treating Tiesha Marich/Extender: Fredirick Maudlin ?Billey Chang ?Weeks in Treatment: 3 ?Edema Assessment ?Assessed: [Left: No] [Right: No] ?Edema: [Left: Ye] [Right: s] ?Calf ?Left: Right: ?Point of Measurement: 31 cm From Medial Instep 33.5 cm ?Ankle ?Left: Right: ?Point of Measurement: 10 cm From Medial Instep 21 cm ?Electronic Signature(s) ?Signed: 06/11/2021 5:25:37 PM By: [name]  RN ?Entered By: [name] on 06/11/2021 13:46:16 ?-------------------------------------------------------------------------------- ?Multi Wound Chart Details ?Patient Name: ?Date of Service: ?[place], Robin Harrell NNIS J. 06/11/2021 1:15 PM ?Medical Record Number: [id number] ?Patient Account Number: [id number] ?Date of Birth/Sex: ?Treating RN: ?11-Nov-1933 (88 y.o. F) [name], Mechele Claude ?Primary Care Trevonn Hallum: Billey Chang ?Other Clinician: ?Referring Avonna Iribe: ?Treating Sanyiah Kanzler/Extender: Fredirick Maudlin ?Billey Chang ?Weeks in Treatment: 3 ?Vital Signs ?Height(in): 62 ?Pulse(bpm): 84 ?Weight(lbs): 115 ?Blood Pressure(mmHg): 102/59 ?Body Mass Index(BMI): 21 ?Temperature(??F): 97.9 ?Respiratory Rate(breaths/min): 18 ?Photos: [N/A:N/A] ?Right, Anterior Lower Leg N/A N/A ?Wound Location: ?Trauma N/A N/A ?Wounding Event: ?Venous Leg Ulcer N/A  N/A ?Primary Etiology: ?Glaucoma, Deep Vein Thrombosis, N/A N/A ?Comorbid History: ?Hypertension, Peripheral Venous ?Disease, Osteoarthritis ?04/23/2021 N/A N/A ?Date Acquired: ?3 N/A N/A ?Weeks of Treatment: ?Open N/A N/A ?Wound Status: ?No N/A N/A ?Wound Recurrence: ?1.2x1.1x0.1 N/A N/A ?Measurements L x W x D (cm) ?1.037 N/A N/A ?A (cm?) : ?rea ?0.104 N/A N/A ?Volume (cm?) : ?79.70% N/A N/A ?% Reduction in Area: ?79.60% N/A N/A ?% Reduction in Volume: ?Full Thickness Without Exposed N/A N/A ?Classification: ?Support Structures ?Medium N/A N/A ?Exudate Amount: ?Serosanguineous N/A N/A ?Exudate Type: ?red, brown N/A N/A ?Exudate Color: ?Flat and Intact N/A N/A ?Wound Margin: ?Large (67-100%) N/A N/A ?Granulation Amount: ?Red, Pink N/A N/A ?Granulation Quality: ?None Present (0%) N/A N/A ?Necrotic Amount: ?Fat Layer (Subcutaneous Tissue): Yes N/A N/A ?Exposed Structures: ?Fascia: No ?Tendon: No ?Muscle: No ?Joint: No ?Bone: No ?Medium (34-66%) N/A N/A ?Epithelialization: ?Compression Therapy N/A N/A ?Procedures Performed: ?Treatment Notes ?Electronic Signature(s) ?Signed: 06/11/2021  1:56:46 PM By: Fredirick Maudlin MD FACS ?Signed: 06/11/2021 5:25:37 PM By: [name] RN ?Entered By: Fredirick Maudlin on 06/11/2021 13:56:46 ?-------------------------------------------------------------------------------- ?Multi-Disciplinary Care Plan Details ?Patient Name: ?Date of Service: ?[place], Robin Harrell NNIS J. 06/11/2021 1:15 PM ?Medical Record Number: [id number] ?Patient Account Number: [id number] ?Date of Birth/Sex: ?Treating RN: ?[birth year] (88 y.o. F) [name], Mechele Claude ?Primary Care Takeyla Million: Billey Chang ?Other Clinician: ?Referring Damoni Causby: ?Treating Keiasha Diep/Extender: Fredirick Maudlin ?Billey Chang ?Weeks in Treatment: 3 ?Multidisciplinary Care Plan reviewed with physician ?Active Inactive ?Abuse / Safety / Falls / Self Care Management ?Nursing Diagnoses: ?History of Falls ?Potential for injury related to falls ?Goals: ?Patient will not experience any injury related to falls ?Date Initiated: 05/21/2021 ?Target Resolution Date: 07/16/2021 ?Goal Status: Active ?Patient/caregiver will verbalize/demonstrate measures taken to prevent injury and/or falls ?Date Initiated: 05/21/2021 ?Target Resolution Date: 07/16/2021 ?Goal Status: Active ?Interventions: ?Assess Activities of Daily Living upon admission and as needed ?Assess fall risk on admission and as needed ?Assess: immobility, friction, shearing, incontinence upon admission and as needed ?Assess impairment of mobility on admission and as needed per policy ?Assess personal safety and home safety (as indicated) on admission and as needed ?Assess self care needs on admission and as needed ?Provide education on fall prevention ?Provide education on personal and home safety ?Notes: ?Venous Leg Ulcer ?Nursing Diagnoses: ?Knowledge deficit related to disease process and management ?Goals: ?Patient will maintain optimal edema control ?Date Initiated: 05/21/2021 ?Target Resolution Date: 07/16/2021 ?Goal Status: Active ?Patient/caregiver will verbalize understanding of  disease process and disease management ?Date Initiated: 05/21/2021 ?Target Resolution Date: 07/16/2021 ?Goal Status: Active ?Interventions: ?Assess peripheral edema status every visit. ?Compression as ordered ?Provide

## 2021-06-11 NOTE — Telephone Encounter (Signed)
Care giver states pt does not remember what the CCM appt/telephone call on 04/20 encompassed. ? ?Please call back to give information to Aid and pt, as appropriate. ?

## 2021-06-14 ENCOUNTER — Other Ambulatory Visit: Payer: Self-pay

## 2021-06-14 ENCOUNTER — Telehealth: Payer: Self-pay | Admitting: Family Medicine

## 2021-06-14 ENCOUNTER — Encounter: Payer: Self-pay | Admitting: Podiatry

## 2021-06-14 ENCOUNTER — Ambulatory Visit (INDEPENDENT_AMBULATORY_CARE_PROVIDER_SITE_OTHER): Payer: Medicare Other | Admitting: Podiatry

## 2021-06-14 DIAGNOSIS — L84 Corns and callosities: Secondary | ICD-10-CM | POA: Diagnosis not present

## 2021-06-14 DIAGNOSIS — B351 Tinea unguium: Secondary | ICD-10-CM | POA: Diagnosis not present

## 2021-06-14 DIAGNOSIS — M79675 Pain in left toe(s): Secondary | ICD-10-CM | POA: Diagnosis not present

## 2021-06-14 DIAGNOSIS — D689 Coagulation defect, unspecified: Secondary | ICD-10-CM

## 2021-06-14 DIAGNOSIS — Z20822 Contact with and (suspected) exposure to covid-19: Secondary | ICD-10-CM | POA: Diagnosis not present

## 2021-06-14 DIAGNOSIS — M79674 Pain in right toe(s): Secondary | ICD-10-CM | POA: Diagnosis not present

## 2021-06-14 DIAGNOSIS — D6851 Activated protein C resistance: Secondary | ICD-10-CM

## 2021-06-14 MED ORDER — LEVOCETIRIZINE DIHYDROCHLORIDE 5 MG PO TABS: ORAL_TABLET | ORAL | 3 refills | Status: AC

## 2021-06-14 NOTE — Telephone Encounter (Signed)
.. ?  Encourage patient to contact the pharmacy for refills or they can request refills through [facility] ? ?LAST APPOINTMENT DATE:   ?05/10/21 ? ?NEXT APPOINTMENT DATE: ?11/17/21 ? ?MEDICATION: ?levocetirizine (XYZAL) 5 MG tablet [[id number]]  ? ? ?Is the patient out of medication?  ?Unknown ? ?PHARMACY: [facility] [address] ? ?Let patient know to contact pharmacy at the end of the day to make sure medication is ready. ? ?Please notify patient to allow 48-72 hours to process  ?

## 2021-06-14 NOTE — Progress Notes (Signed)
Robin Harrell, MATES ([id number]) ?Visit Report for 06/11/2021 ?Chief Complaint Document Details ?Patient Name: Date of Service: ?[name], [name] 06/11/2021 1:15 PM ?Medical Record Number: [id number] ?Patient Account Number: [id number] ?Date of Birth/Sex: Treating RN: ?05-Nov-1933 (88 y.o. F) Harrell, Robin Claude ?Primary Care Provider: Billey Chang Other Clinician: ?Referring Provider: ?Treating Provider/Extender: Fredirick Maudlin ?Billey Chang ?Weeks in Treatment: 3 ?Information Obtained from: Patient ?Chief Complaint ?Patient presents for treatment of an open ulcer due to venous insufficiency ?Electronic Signature(s) ?Signed: 06/11/2021 1:57:00 PM By: Fredirick Maudlin MD FACS ?Entered By: Fredirick Maudlin on 06/11/2021 13:57:00 ?-------------------------------------------------------------------------------- ?HPI Details ?Patient Name: Date of Service: ?[place], [name] 06/11/2021 1:15 PM ?Medical Record Number: [id number] ?Patient Account Number: [id number] ?Date of Birth/Sex: Treating RN: ?[birth year] (88 y.o. F) Harrell, Robin Claude ?Primary Care Provider: Billey Chang Other Clinician: ?Referring Provider: ?Treating Provider/Extender: Fredirick Maudlin ?Billey Chang ?Weeks in Treatment: 3 ?History of Present Illness ?HPI Description: ADMISSION ?05/21/2021: ?This is an 88 year old woman with a fairly unremarkable past medical history aside from essential hypertension, mild aortic stenosis, and mild venous stasis. ?She is not diabetic, nor does she smoke. 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 At the beginning of the month, a frozen dinner fell from the freezer and landed on her right leg. 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 Urgent care placed her on a 5-day course of Keflex, she does have a home health aide that comes 3 ?times a week. They have been  applying Vaseline to the site. ABI on the right today was 1.3. In addition to the main wound for which she is being seen, our ?intake nurse noted an area of skin denudement just distal to this main wound. It looks like it may have been a blister initially. The patient has not experienced ?any fevers or chills. She does not have significant pain at the wound site. She says that she does not wear compression stockings because they are too tight ?and she finds them difficult to don and doff. ?05/28/2021: The small distal wound has closed. The larger proximal wound is extremely clean with good granulation tissue at the base. No purulent drainage or ?slough accumulation. No concern for infection. ?06/04/2021: Although we had discussed ordering juxta lite stockings, it does not appear that these ever were ordered and they certainly have not arrived at the ?patient's residence. The wound is quite a bit smaller today with good granulation tissue at the base; it is nearly flush with the surrounding skin. ?06/11/2021: The patient does have her juxta lite stocking with her today. The wound has decreased by about half in diameter. It is clean with a good base of ?granulation tissue. ?Electronic Signature(s) ?Signed: 06/11/2021 1:58:34 PM By: Fredirick Maudlin MD FACS ?Entered By: Fredirick Maudlin on 06/11/2021 13:58:34 ?-------------------------------------------------------------------------------- ?Physical Exam Details ?Patient Name: Date of Service: ?[name], [name] 06/11/2021 1:15 PM ?Medical Record Number: [id number] ?Patient Account Number: [id number] ?Date of Birth/Sex: Treating RN: ?Dec 24, 1933 (88 y.o. F) Harrell, Robin Claude ?Primary Care Provider: Billey Chang Other Clinician: ?Referring Provider: ?Treating Provider/Extender: Fredirick Maudlin ?Billey Chang ?Weeks in Treatment: 3 ?Constitutional ?. . . . No acute distress. ?Respiratory ?Normal work of breathing on room air. ?Notes ?06/11/2021: The wound is smaller by about  half today. It is very clean and has a nice base of granulation tissue. ?Electronic Signature(s) ?Signed: 06/11/2021 2:00:34  PM By: Fredirick Maudlin MD FACS ?Entered By: Fredirick Maudlin on 06/11/2021 14:00:33 ?-------------------------------------------------------------------------------- ?Physician Orders Details ?Patient Name: Date of Service: ?[name], [name] 06/11/2021 1:15 PM ?Medical Record Number: [id number] ?Patient Account Number: [id number] ?Date of Birth/Sex: Treating RN: ?Nov 11, 1933 (88 y.o. 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 - Dr. Celine Ahr room 3 ?Return A ?Bathing/ Shower/ Hygiene ?May shower with protection but do not get wound dressing(s) wet. - Ok to use [employer], can [facility] [place], or [place] ?Edema Control - Lymphedema / SCD / Other ?Elevate legs to the level of the heart or above for 30 minutes daily and/or when sitting, a frequency of: - throughout the day ?Avoid standing for long periods of time. ?Exercise regularly ?Wound Treatment ?Wound #1 - Lower Leg Wound Laterality: Right, Anterior ?Cleanser: Soap and Water 1 x Per Week/15 Days ?Discharge Instructions: May shower and wash wound with dial antibacterial soap and water prior to dressing change. ?Cleanser: Wound Cleanser 1 x Per Week/15 Days ?Discharge Instructions: Cleanse the wound with wound cleanser prior to applying a clean dressing using gauze sponges, not tissue or cotton balls. ?Peri-Wound Care: Sween Lotion  (Moisturizing lotion) 1 x Per Week/15 Days ?Discharge Instructions: Apply moisturizing lotion as directed ?Prim Dressing: Promogran Prisma Matrix, 4.34 (sq in) (silver collagen) 1 x Per Week/15 Days ?ary ?Discharge Instructions: Moisten collagen with saline or hydrogel ?Secondary Dressing: Zetuvit Plus 4x8 in 1 x Per Week/15 Days ?Discharge Instructions: Apply over primary dressing as directed. ?Compression Wrap: ThreePress (3 layer compression wrap) 1 x Per Week/15 Days ?Discharge Instructions: Apply three layer compression as directed. ?Compression Stockings: Circaid Juxta Lite Compression Wrap ?Right Leg Compression Amount: 20-30 mmHG ?Discharge Instructions: Apply Circaid Juxta Lite Compression Wrap daily as instructed. Apply first thing in the morning, remove at ?night before bed. ?Electronic Signature(s) ?Signed: 06/11/2021 4:58:30 PM By: Fredirick Maudlin MD FACS ?Entered By: Fredirick Maudlin on 06/11/2021 14:00:54 ?-------------------------------------------------------------------------------- ?Problem List Details ?Patient Name: ?Date of Service: ?[place], [name] 06/11/2021 1:15 PM ?Medical Record Number: [id number] ?Patient Account Number: [id number] ?Date of Birth/Sex: ?Treating RN: ?04-23-1933 (88 y.o. F) Harrell, Robin Claude ?Primary Care Provider: Billey Chang ?Other Clinician: ?Referring Provider: ?Treating Provider/Extender: Fredirick Maudlin ?Billey Chang ?Weeks in Treatment: 3 ?Active Problems ?ICD-10 ?Encounter ?Code Description Active Date MDM ?Diagnosis ?K35.465 Non-pressure chronic ulcer of other part of right lower leg with fat layer 05/21/2021 No Yes ?exposed ?I87.2 Venous insufficiency (chronic) (peripheral) 05/21/2021 No Yes ?K81.275 Personal history of other venous thrombosis and embolism 05/21/2021 No Yes ?I51.89 Other ill-defined heart diseases 05/21/2021 No Yes ?Inactive Problems ?ICD-10 ?Code Description Active Date Inactive Date ?L97.811 Non-pressure chronic ulcer of other part of right  lower leg limited to breakdown of skin 05/21/2021 05/21/2021 ?Resolved Problems ?Electronic Signature(s) ?Signed: 06/11/2021 1:56:39 PM By: Fredirick Maudlin MD FACS ?Entered By: Fredirick Maudlin on 06/11/2021 13:56:

## 2021-06-14 NOTE — Telephone Encounter (Signed)
Rx sent 

## 2021-06-15 ENCOUNTER — Other Ambulatory Visit: Payer: Self-pay

## 2021-06-15 ENCOUNTER — Telehealth: Payer: Self-pay | Admitting: Family Medicine

## 2021-06-15 DIAGNOSIS — Z20822 Contact with and (suspected) exposure to covid-19: Secondary | ICD-10-CM | POA: Diagnosis not present

## 2021-06-15 DIAGNOSIS — E039 Hypothyroidism, unspecified: Secondary | ICD-10-CM

## 2021-06-15 MED ORDER — LEVOTHYROXINE SODIUM 25 MCG PO TABS: 25.0000 ug | ORAL_TABLET | Freq: Every day | ORAL | 3 refills | Status: DC

## 2021-06-15 NOTE — Telephone Encounter (Signed)
Last Prolia inj 06/11/21 ?Next Prolia inj due 12/12/21 ?

## 2021-06-15 NOTE — Telephone Encounter (Signed)
Rx sent 

## 2021-06-15 NOTE — Telephone Encounter (Signed)
.. ?  Encourage patient to contact the pharmacy for refills or they can request refills through [facility] ? ?LAST APPOINTMENT DATE:   ?05/10/21 ? ?NEXT APPOINTMENT DATE: ?11/17/21 ? ?MEDICATION: ?levothyroxine (SYNTHROID) 25 MCG tablet [[id number]]  ? ?Is the patient out of medication?  ?No ? ?PHARMACY: ?[id number] [place] [place] - [address] [place] ?Phone:  [phone] Fax:  [phone] ?DEA #:  [id number] Reason: -- ? ? ? ? ?Let patient know to contact pharmacy at the end of the day to make sure medication is ready. ? ?Please notify patient to allow 48-72 hours to process  ?

## 2021-06-18 ENCOUNTER — Encounter (HOSPITAL_BASED_OUTPATIENT_CLINIC_OR_DEPARTMENT_OTHER): Payer: Medicare Other | Admitting: General Surgery

## 2021-06-18 ENCOUNTER — Other Ambulatory Visit: Payer: Self-pay

## 2021-06-18 ENCOUNTER — Ambulatory Visit (INDEPENDENT_AMBULATORY_CARE_PROVIDER_SITE_OTHER): Payer: Medicare Other

## 2021-06-18 DIAGNOSIS — Z7901 Long term (current) use of anticoagulants: Secondary | ICD-10-CM | POA: Diagnosis not present

## 2021-06-18 DIAGNOSIS — L97812 Non-pressure chronic ulcer of other part of right lower leg with fat layer exposed: Secondary | ICD-10-CM | POA: Diagnosis not present

## 2021-06-18 DIAGNOSIS — I82409 Acute embolism and thrombosis of unspecified deep veins of unspecified lower extremity: Secondary | ICD-10-CM | POA: Diagnosis not present

## 2021-06-18 DIAGNOSIS — E039 Hypothyroidism, unspecified: Secondary | ICD-10-CM

## 2021-06-18 DIAGNOSIS — Z87891 Personal history of nicotine dependence: Secondary | ICD-10-CM | POA: Diagnosis not present

## 2021-06-18 DIAGNOSIS — I872 Venous insufficiency (chronic) (peripheral): Secondary | ICD-10-CM | POA: Diagnosis not present

## 2021-06-18 DIAGNOSIS — Z86718 Personal history of other venous thrombosis and embolism: Secondary | ICD-10-CM | POA: Diagnosis not present

## 2021-06-18 DIAGNOSIS — I5189 Other ill-defined heart diseases: Secondary | ICD-10-CM | POA: Diagnosis not present

## 2021-06-18 LAB — POCT INR: INR: 2.6 (ref 2.0–3.0)

## 2021-06-18 MED ORDER — LEVOTHYROXINE SODIUM 25 MCG PO TABS: 25.0000 ug | ORAL_TABLET | Freq: Every day | ORAL | 3 refills | Status: AC

## 2021-06-18 NOTE — Patient Instructions (Signed)
Continue to take warfarin 1 tablet daily except for 1/2 a tablet on Monday, Wednesday and Fridays. Recheck INR in 6 weeks.  ?[id number] ?

## 2021-06-18 NOTE — Progress Notes (Addendum)
Robin Harrell, AMBROCIO ([id number]) ?Visit Report for 06/18/2021 ?Chief Complaint Document Details ?Patient Name: Date of Service: ?[facility] RD, [name] 06/18/2021 2:00 PM ?Medical Record Number: [id number] ?Patient Account Number: [id number] ?Date of Birth/Sex: Treating RN: ?[name] 17, 1934 (88 y.o. F) Harrell, Robin Claude ?Primary Care Provider: Billey Chang Other Clinician: ?Referring Provider: ?Treating Provider/Extender: Fredirick Maudlin ?Billey Chang ?Weeks in Treatment: 4 ?Information Obtained from: Patient ?Chief Complaint ?Patient presents for treatment of an open ulcer due to venous insufficiency ?Electronic Signature(s) ?Signed: 06/18/2021 3:05:14 PM By: Fredirick Maudlin MD FACS ?Entered By: Fredirick Maudlin on 06/18/2021 15:05:13 ?-------------------------------------------------------------------------------- ?HPI Details ?Patient Name: Date of Service: ?[facility] RD, [name] 06/18/2021 2:00 PM ?Medical Record Number: [id number] ?Patient Account Number: [id number] ?Date of Birth/Sex: Treating RN: ?02-01-1934 (88 y.o. F) Harrell, Robin Claude ?Primary Care Provider: Billey Chang Other Clinician: ?Referring Provider: ?Treating Provider/Extender: Fredirick Maudlin ?Billey Chang ?Weeks in Treatment: 4 ?History of Present Illness ?HPI Description: ADMISSION ?05/21/2021: ?This is an 88 year old woman with a fairly unremarkable past medical history aside from essential hypertension, mild aortic stenosis, and mild venous stasis. ?She is not diabetic, nor does she smoke. 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 At the beginning of the month, a frozen dinner fell from the freezer and landed on her right leg. 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 Urgent care placed her on a 5-day course of Keflex, she does have a home health aide that comes 3 ?times a week. They have been  applying Vaseline to the site. ABI on the right today was 1.3. In addition to the main wound for which she is being seen, our ?intake nurse noted an area of skin denudement just distal to this main wound. It looks like it may have been a blister initially. The patient has not experienced ?any fevers or chills. She does not have significant pain at the wound site. She says that she does not wear compression stockings because they are too tight ?and she finds them difficult to don and doff. ?05/28/2021: The small distal wound has closed. The larger proximal wound is extremely clean with good granulation tissue at the base. No purulent drainage or ?slough accumulation. No concern for infection. ?06/04/2021: Although we had discussed ordering juxta lite stockings, it does not appear that these ever were ordered and they certainly have not arrived at the ?patient's residence. The wound is quite a bit smaller today with good granulation tissue at the base; it is nearly flush with the surrounding skin. ?06/11/2021: The patient does have her juxta lite stocking with her today. The wound has decreased by about half in diameter. It is clean with a good base of ?granulation tissue. ?06/18/2021: The wound is nearly closed. I have asked her to bring her juxta lite stockings with her next week. ?Electronic Signature(s) ?Signed: 06/18/2021 3:05:36 PM By: Fredirick Maudlin MD FACS ?Entered By: Fredirick Maudlin on 06/18/2021 15:05:36 ?-------------------------------------------------------------------------------- ?Physical Exam Details ?Patient Name: Date of Service: ?[facility] RD, [name] 06/18/2021 2:00 PM ?Medical Record Number: [id number] ?Patient Account Number: [id number] ?Date of Birth/Sex: Treating RN: ?[name] 12, 1934 (88 y.o. F) Harrell, Robin Claude ?Primary Care Provider: Billey Chang Other Clinician: ?Referring Provider: ?Treating Provider/Extender: Fredirick Maudlin ?Billey Chang ?Weeks in Treatment: 4 ?Constitutional ?. . . . No acute  distress. ?Respiratory ?Normal work of breathing on room air. ?Notes ?06/18/2021: The wound is nearly closed.  Periwound skin is intact. ?Electronic Signature(s) ?Signed: 06/18/2021 3:06:39 PM By: Fredirick Maudlin MD FACS ?Entered By: Fredirick Maudlin on 06/18/2021 15:06:39 ?-------------------------------------------------------------------------------- ?Physician Orders Details ?Patient Name: Date of Service: ?[facility] RD, [name] 06/18/2021 2:00 PM ?Medical Record Number: [id number] ?Patient Account Number: [id number] ?Date of Birth/Sex: Treating RN: ?08-21-1933 (88 y.o. 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 - Dr. Celine Ahr - Room 4 - Thursday 5/4 at 3:15 ?Return A ?Bathing/ Shower/ Hygiene ?May shower with protection but do not get wound dressing(s) wet. - Ok to use [employer], can buy at [facility], [place], or [place] ?Edema Control - Lymphedema / SCD / Other ?Elevate legs to the level of the heart or above for 30 minutes daily and/or when sitting, a frequency of: - throughout the day ?Avoid standing for long periods of time. ?Exercise regularly ?Wound Treatment ?Wound #1 - Lower Leg Wound Laterality: Right, Anterior ?Cleanser: Soap and Water 1 x Per Week/15 Days ?Discharge Instructions: May shower and wash wound with dial antibacterial soap and water prior to dressing change. ?Cleanser: Wound Cleanser 1 x Per Week/15 Days ?Discharge Instructions: Cleanse the wound with wound cleanser prior to applying a clean  dressing using gauze sponges, not tissue or cotton balls. ?Peri-Wound Care: Sween Lotion (Moisturizing lotion) 1 x Per Week/15 Days ?Discharge Instructions: Apply moisturizing lotion as directed ?Prim Dressing: Promogran Prisma Matrix, 4.34 (sq in) (silver collagen) 1 x Per Week/15 Days ?ary ?Discharge Instructions: Moisten collagen with saline or hydrogel ?Secondary Dressing: Woven Gauze Sponge, Non-Sterile 4x4 in 1 x Per Week/15 Days ?Discharge Instructions: Apply over primary dressing as directed. ?Compression Wrap: ThreePress (3 layer compression wrap) 1 x Per Week/15 Days ?Discharge Instructions: Apply three layer compression as directed. ?Electronic Signature(s) ?Signed: 06/21/2021 7:54:07 AM By: Fredirick Maudlin MD FACS ?Signed: 06/21/2021 5:50:11 PM By: [name] RN, BSN ?Previous Signature: 06/18/2021 3:06:59 PM Version By: Fredirick Maudlin MD FACS ?Entered By: [name] on 06/18/2021 15:08:23 ?-------------------------------------------------------------------------------- ?Problem List Details ?Patient Name: ?Date of Service: ?[facility] RD, [name] 06/18/2021 2:00 PM ?Medical Record Number: [id number] ?Patient Account Number: [id number] ?Date of Birth/Sex: ?Treating RN: ?06-Aug-1933 (88 y.o. Robin Sprague, Harrell ?Primary Care Provider: Billey Chang ?Other Clinician: ?Referring Provider: ?Treating Provider/Extender: Fredirick Maudlin ?Billey Chang ?Weeks in Treatment: 4 ?Active Problems ?ICD-10 ?Encounter ?Code Description Active Date MDM ?Diagnosis ?X32.440 Non-pressure chronic ulcer of other part of right lower leg with fat layer 05/21/2021 No Yes ?exposed ?I87.2 Venous insufficiency (chronic) (peripheral) 05/21/2021 No Yes ?N02.725 Personal history of other venous thrombosis and embolism 05/21/2021 No Yes ?I51.89 Other ill-defined heart diseases 05/21/2021 No Yes ?Inactive Problems ?ICD-10 ?Code Description Active Date Inactive Date ?L97.811 Non-pressure chronic ulcer of other part of right lower leg limited  to breakdown of skin 05/21/2021 05/21/2021 ?Resolved Problems ?Electronic Signature(s) ?Signed: 06/18/2021 3:03:34 PM By: Fredirick Maudlin MD FACS ?Entered By: Fredirick Maudlin on 06/18/2021 15:03:33 ?------------

## 2021-06-19 ENCOUNTER — Encounter (HOSPITAL_COMMUNITY): Payer: Self-pay | Admitting: Emergency Medicine

## 2021-06-19 ENCOUNTER — Emergency Department (HOSPITAL_COMMUNITY): Payer: Medicare Other

## 2021-06-19 ENCOUNTER — Other Ambulatory Visit: Payer: Self-pay

## 2021-06-19 ENCOUNTER — Emergency Department (HOSPITAL_COMMUNITY)
Admission: EM | Admit: 2021-06-19 | Discharge: 2021-06-19 | Disposition: A | Discharge status: Home / Self Care | Payer: Medicare Other | Attending: Emergency Medicine | Admitting: Emergency Medicine

## 2021-06-19 DIAGNOSIS — M4802 Spinal stenosis, cervical region: Secondary | ICD-10-CM | POA: Diagnosis not present

## 2021-06-19 DIAGNOSIS — S20419A Abrasion of unspecified back wall of thorax, initial encounter: Secondary | ICD-10-CM | POA: Diagnosis not present

## 2021-06-19 DIAGNOSIS — S299XXA Unspecified injury of thorax, initial encounter: Secondary | ICD-10-CM | POA: Diagnosis not present

## 2021-06-19 DIAGNOSIS — S1091XA Abrasion of unspecified part of neck, initial encounter: Secondary | ICD-10-CM | POA: Insufficient documentation

## 2021-06-19 DIAGNOSIS — Z7901 Long term (current) use of anticoagulants: Secondary | ICD-10-CM | POA: Insufficient documentation

## 2021-06-19 DIAGNOSIS — T1490XA Injury, unspecified, initial encounter: Secondary | ICD-10-CM

## 2021-06-19 DIAGNOSIS — I517 Cardiomegaly: Secondary | ICD-10-CM | POA: Diagnosis not present

## 2021-06-19 DIAGNOSIS — W109XXA Fall (on) (from) unspecified stairs and steps, initial encounter: Secondary | ICD-10-CM | POA: Insufficient documentation

## 2021-06-19 DIAGNOSIS — Z20822 Contact with and (suspected) exposure to covid-19: Secondary | ICD-10-CM | POA: Diagnosis not present

## 2021-06-19 DIAGNOSIS — S0990XA Unspecified injury of head, initial encounter: Secondary | ICD-10-CM | POA: Diagnosis not present

## 2021-06-19 DIAGNOSIS — Z23 Encounter for immunization: Secondary | ICD-10-CM | POA: Insufficient documentation

## 2021-06-19 DIAGNOSIS — M8588 Other specified disorders of bone density and structure, other site: Secondary | ICD-10-CM | POA: Diagnosis not present

## 2021-06-19 DIAGNOSIS — Z87891 Personal history of nicotine dependence: Secondary | ICD-10-CM | POA: Insufficient documentation

## 2021-06-19 DIAGNOSIS — Z043 Encounter for examination and observation following other accident: Secondary | ICD-10-CM | POA: Diagnosis not present

## 2021-06-19 DIAGNOSIS — S3993XA Unspecified injury of pelvis, initial encounter: Secondary | ICD-10-CM | POA: Diagnosis not present

## 2021-06-19 DIAGNOSIS — Z79899 Other long term (current) drug therapy: Secondary | ICD-10-CM | POA: Diagnosis not present

## 2021-06-19 DIAGNOSIS — M47812 Spondylosis without myelopathy or radiculopathy, cervical region: Secondary | ICD-10-CM | POA: Diagnosis not present

## 2021-06-19 DIAGNOSIS — M419 Scoliosis, unspecified: Secondary | ICD-10-CM | POA: Diagnosis not present

## 2021-06-19 DIAGNOSIS — Z8616 Personal history of COVID-19: Secondary | ICD-10-CM | POA: Insufficient documentation

## 2021-06-19 DIAGNOSIS — S0003XA Contusion of scalp, initial encounter: Secondary | ICD-10-CM | POA: Diagnosis not present

## 2021-06-19 DIAGNOSIS — I10 Essential (primary) hypertension: Secondary | ICD-10-CM | POA: Insufficient documentation

## 2021-06-19 DIAGNOSIS — R918 Other nonspecific abnormal finding of lung field: Secondary | ICD-10-CM | POA: Diagnosis not present

## 2021-06-19 MED ORDER — TETANUS-DIPHTH-ACELL PERTUSSIS 5-2.5-18.5 LF-MCG/0.5 IM SUSY
0.5000 mL | PREFILLED_SYRINGE | Freq: Once | INTRAMUSCULAR | Status: AC
  Administered 2021-06-19: 0.5 mL via INTRAMUSCULAR
  Filled 2021-06-19: qty 0.5

## 2021-06-19 NOTE — Progress Notes (Signed)
Chaplain responded to fall on thinners. Pt not available, no family was present.  Please contact as needed for follow-up. ? ?Minus Liberty, Chaplain ?Pager:  [phone] ? ? ? 06/19/21 1015  ?Clinical Encounter Type  ?Visited With Patient not available  ?Visit Type Initial;Trauma  ?Referral From Physician  ?Consult/Referral To Chaplain  ?Stress Factors  ?Patient Stress Factors Health changes  ? ? ?

## 2021-06-19 NOTE — ED Triage Notes (Signed)
Pt fell while bending over to put her shoe on just prior to arrival.  Hit the L side of her head on a stool.  Hematoma to L side of head and abrasions to upper back.  Denies LOC.  Pt takes Coumadin. ?

## 2021-06-19 NOTE — Discharge Instructions (Addendum)
It was a pleasure caring for you today in the emergency department. ? ?Please return to the emergency department for any worsening or worrisome symptoms. ? ? ?Based on the events which brought you to the ER today, it is possible that you may have a concussion. A concussion occurs when there is a blow to the head or body, with enough force to shake the brain and disrupt how the brain functions. You may experience symptoms such as headaches, sensitivity to light/noise, dizziness, cognitive slowing, difficulty concentrating / remembering, trouble sleeping and drowsiness. These symptoms may last anywhere from hours/days to potentially weeks/months. While these symptoms are very frustrating and perhaps debilitating, it is important that you remember that they will improve over time. Everyone has a different rate of recovery; it is difficult to predict when your symptoms will resolve. In order to allow for your brain to heal after the injury, we recommend that you see your primary physician or a physician knowledgeable in concussion management. We also advise you to let your body and brain rest: avoid physical activities (sports, gym, and exercise) and reduce cognitive demands (reading, texting, TV watching, computer use, video games, etc). School attendance, after-school activities and work may need to be modified to avoid increasing symptoms. We recommend against driving until until all symptoms have resolved. You should take '650mg'$  of Acetaminophen (Tylenol) every 4 hours as needed for pain control; however, taking anti-inflammatory medication (Motrin/Advil/Ibuprofen) is not advised. Come back to the ER right away if you are having repeated episodes of vomiting, severe/worsening headache/dizziness or any other symptom that alarms you. We recommended that someone stay with you for the next 24 hours to monitor for these worrisome symptoms. ? ?

## 2021-06-19 NOTE — ED Provider Notes (Addendum)
?[facility] ?Provider Note ? ? ?CSN: [id number] ?Arrival date & time: 06/19/21  1044 ? ?  ? ?History ? ?Chief Complaint  ?Patient presents with  ? Level 2  ? ? ?Robin Harrell is a 88 y.o. female. ? ? Patient as above with significant medical history as below, including active 5 [place] on warfarin, hypertension, PE, hyperlipidemia who presents to the ED with complaint of head, back injury. ? ?Patient reports that she attempted to put on her shoe, she fell to the ground, a stepladder fell on top of her.  Positive head injury, positive C/T-spine injury.  She was unable to get up after the fall.  She has difficulty with ambulation at baseline.  No LOC, yes thinners.  No chest pain, dyspnea, nausea or vomiting, no numbness or tingling.  No incontinence.  No witnessed seizure-like activity.  No LOC. ? ?Patient is unsure of last tetanus shot. ? ? ?Past Medical History: ?No date: Allergic rhinitis ?No date: Cough ?No date: Difficulty in swallowing ?    Comment:  w/ occasional aspiration ?No date: DVT (deep venous thrombosis) ([place]) ?No date: Dyslipidemia ?No date: Factor V Leiden deficiency ?    Comment:  lifelong coumadin ?No date: GERD (gastroesophageal reflux disease) ?12/28/2007: History of nuclear stress test ?    Comment:  lexiscan; low risk  ?No date: HTN (hypertension) ?No date: PND (post-nasal drip) ?02/26/2019: Pneumonia due to COVID-19 virus ?    Comment:  02/25/2019-SARS-CoV-2-positive [facility] in [name]/2021 -  ?             GVC -received IV steroids and remdesivir, did not appear  ?             to require mechanical ventilation ?No date: Pulmonary embolism ([place]) ?No date: PVC's (premature ventricular contractions) ?No date: Thyroid disease ? ?Past Surgical History: ?No date: APPENDECTOMY ?No date: BUNIONECTOMY ?11/02/2013: CATARACT EXTRACTION W/ INTRAOCULAR LENS IMPLANT; Right ?1973: CHOLECYSTECTOMY ?No date: ESOPHAGEAL DILATION ?No date: HAMMER TOE SURGERY ?12/27/2007:  history of sleep study ?    Comment:  AHI during total sleep 0.9/hr and REM 1.5/hr; RDI during ?             total sleep6.0/hr and REM 6.1/hr ?01/11/2002: REPLACEMENT TOTAL KNEE ?    Comment:  right ?12/28/2007: TRANSTHORACIC ECHOCARDIOGRAM ?    Comment:  borderline conc LVH with normal systolic function; MV  ?             mod thickened with mild MVP & mild MR  ? ? ?The history is provided by the patient. No language interpreter was used.  ? ?  ? ?Home Medications ?Prior to Admission medications   ?Medication Sig Start Date End Date Taking? Authorizing Provider  ?acetaminophen (TYLENOL) 500 MG tablet Take 1,000 mg by mouth every 6 (six) hours as needed for moderate pain.   Yes [provider]  ?atorvastatin (LIPITOR) 10 MG tablet TAKE 1 TABLET (10 MG TOTAL) BY MOUTH DAILY AT 6 PM. 04/08/21  Yes Lorretta Harp, MD  ?azelastine (ASTELIN) 0.1 % nasal spray Place 2 sprays into both nostrils 2 (two) times daily. Use in each nostril as directed 03/19/21  Yes Cobb, Karie Schwalbe, NP  ?brimonidine (ALPHAGAN) 0.2 % ophthalmic solution Place 1 drop into both eyes 2 (two) times daily. 03/03/21  Yes [provider]  ?Calcium-Phosphorus-Vitamin D (CALCIUM/D3 ADULT GUMMIES) 200-96.6-200 MG-MG-UNIT CHEW Chew 2 tablets by mouth daily.   Yes [provider]  ?fluticasone (FLONASE) 50  MCG/ACT nasal spray SPRAY 2 SPRAYS INTO EACH NOSTRIL EVERY DAY ?Patient taking differently: Place 2 sprays into both nostrils daily. 01/18/21  Yes Collene Gobble, MD  ?ipratropium (ATROVENT) 0.03 % nasal spray Place 2 sprays into the nose 3 (three) times daily as needed. 05/20/21  Yes [provider]  ?latanoprost (XALATAN) 0.005 % ophthalmic solution Place 1 drop into both eyes at bedtime.  10/08/10  Yes [provider]  ?levocetirizine (XYZAL) 5 MG tablet TAKE 1 TABLET BY MOUTH EVERY DAY IN THE EVENING 06/14/21  Yes [name], MD  ?levothyroxine (SYNTHROID) 25 MCG tablet Take 1 tablet (25 mcg total) by  mouth daily before breakfast. 06/18/21  Yes [name], MD  ?losartan (COZAAR) 50 MG tablet TAKE 1 TABLET BY MOUTH EVERY DAY ?Patient taking differently: Take 50 mg by mouth daily. 05/27/21  Yes Lorretta Harp, MD  ?metoprolol tartrate (LOPRESSOR) 25 MG tablet Take 1 tablet (25 mg total) by mouth 2 (two) times daily. 05/17/21  Yes Lorretta Harp, MD  ?multivitamin-iron-minerals-folic acid (CENTRUM) chewable tablet Chew 1 tablet by mouth daily.   Yes [provider]  ?vitamin E 1000 UNIT capsule Take 1,000 Units by mouth daily.   Yes [provider]  ?warfarin (COUMADIN) 7.5 MG tablet TAKE 1/2 TO 1 TABLET BY MOUTH DAILY AS DIRECTED ?Patient taking differently: Take 7.5 mg by mouth daily. 1/2 tab M,W, F and 1 tablet on T,Th,S,Su 03/22/21  Yes Minus Breeding, MD  ?   ? ?Allergies    ?Azithromycin, Codeine, and Erythromycin   ? ?Review of Systems   ?Review of Systems  ?Constitutional:  Negative for chills and fever.  ?HENT:  Negative for facial swelling and trouble swallowing.   ?Eyes:  Negative for photophobia and visual disturbance.  ?Respiratory:  Negative for cough and shortness of breath.   ?Cardiovascular:  Negative for chest pain and palpitations.  ?Gastrointestinal:  Negative for abdominal pain, nausea and vomiting.  ?Endocrine: Negative for polydipsia and polyuria.  ?Genitourinary:  Negative for difficulty urinating and hematuria.  ?Musculoskeletal:  Positive for back pain. Negative for gait problem and joint swelling.  ?Skin:  Negative for pallor and rash.  ?Neurological:  Negative for syncope, facial asymmetry and headaches.  ?Psychiatric/Behavioral:  Negative for agitation and confusion.   ? ?Physical Exam ?Updated Vital Signs ?BP (!) 176/95   Pulse 90   Temp 98 ?F (36.7 ?C)   Resp 20   LMP  (LMP Unknown)   SpO2 100%  ?Physical Exam ?Vitals and nursing note reviewed.  ?Constitutional:   ?   General: She is not in acute distress. ?   Appearance: Normal appearance. She is not  ill-appearing or diaphoretic.  ?   Comments: Kyphotic posture  ?HENT:  ?   Head: Normocephalic.  ?   Comments: Left sided scalp hematoma  ?   Right Ear: External ear normal.  ?   Left Ear: External ear normal.  ?   Nose: Nose normal.  ?   Mouth/Throat:  ?   Mouth: Mucous membranes are moist.  ?Eyes:  ?   General: No scleral icterus.    ?   Right eye: No discharge.     ?   Left eye: No discharge.  ?   Extraocular Movements: Extraocular movements intact.  ?   Pupils: Pupils are equal, round, and reactive to light.  ?Cardiovascular:  ?   Rate and Rhythm: Normal rate and regular rhythm.  ?   Pulses: Normal pulses.  ?  Heart sounds: Normal heart sounds.  ?  No S3 or S4 sounds.  ?Pulmonary:  ?   Effort: Pulmonary effort is normal. No tachypnea, accessory muscle usage or respiratory distress.  ?   Breath sounds: Normal breath sounds. No stridor.  ?Abdominal:  ?   General: Abdomen is flat.  ?   Palpations: Abdomen is soft.  ?   Tenderness: There is no abdominal tenderness. There is no guarding or rebound.  ?Musculoskeletal:     ?   General: Normal range of motion.  ?     Arms: ? ?   Cervical back: Normal range of motion.  ?   Right lower leg: No edema.  ?   Left lower leg: No edema.  ?   Comments: She has mild TTP at lower cervical, upper thoracic spine at site of abrasion.  ? ?No step-off or crepitus. ? ?  ?Skin: ?   General: Skin is warm and dry.  ?   Capillary Refill: Capillary refill takes less than 2 seconds.  ?Neurological:  ?   Mental Status: She is alert and oriented to person, place, and time.  ?   GCS: GCS eye subscore is 4. GCS verbal subscore is 5. GCS motor subscore is 6.  ?   Cranial Nerves: Cranial nerves 2-12 are intact. No dysarthria.  ?   Sensory: Sensation is intact.  ?   Motor: Motor function is intact. No tremor or pronator drift.  ?   Coordination: Coordination is intact. Coordination normal.  ?   Comments: Strength equal upper and lower extremities  ?Psychiatric:     ?   Mood and Affect: Mood normal.      ?   Behavior: Behavior normal.  ? ? ?ED Results / Procedures / Treatments   ?Labs ?(all labs ordered are listed, but only abnormal results are displayed) ?Labs Reviewed - No data to display ? ?EKG ?None ? ?Radiol

## 2021-06-19 NOTE — Progress Notes (Signed)
Orthopedic Tech Progress Note ?Patient Details:  ?Robin Harrell ?09-26-1933 ?[id number] ? ?Level 2 trauma ? ?Patient ID: Robin Harrell, female   DOB: 02-18-1934, 88 y.o.   MRN: [id number] ? ?Robin Harrell ?06/19/2021, 1:10 PM ? ?

## 2021-06-20 ENCOUNTER — Encounter: Payer: Self-pay | Admitting: Family Medicine

## 2021-06-20 DIAGNOSIS — I10 Essential (primary) hypertension: Secondary | ICD-10-CM | POA: Diagnosis not present

## 2021-06-21 NOTE — Progress Notes (Signed)
Robin Harrell, Robin Harrell ([id number]) ?Visit Report for 06/18/2021 ?Arrival Information Details ?Patient Name: Date of Service: ?[facility] RD, [name] 06/18/2021 2:00 PM ?Medical Record Number: [id number] ?Patient Account Number: [id number] ?Date of Birth/Sex: Treating Harrell: ?12-31-1933 (88 y.o. F) Robin Harrell ?Primary Care Robin Harrell: Robin Harrell Other Clinician: ?Referring Robin Harrell: ?Treating Robin Harrell/Extender: Robin Harrell ?Robin Harrell ?Weeks in Treatment: 4 ?Visit Information History Since Last Visit ?Added or deleted any medications: No ?Patient Arrived: Robin Harrell ?Any new allergies or adverse reactions: No ?Arrival Time: 14:34 ?Had a fall or experienced change in No ?Accompanied By: caregiver ?activities of daily living that may affect ?Transfer Assistance: Manual ?risk of falls: ?Patient Identification Verified: Yes ?Signs or symptoms of abuse/neglect since last visito No ?Secondary Verification Process Completed: Yes ?Hospitalized since last visit: No ?Patient Requires Transmission-Based Precautions: No ?Implantable device outside of the clinic excluding No ?Patient Has Alerts: Yes ?cellular tissue based products placed in the center ?Patient Alerts: Patient on Blood Thinner since last visit: ?Has Dressing in Place as Prescribed: Yes ?Pain Present Now: No ?Electronic Signature(s) ?Signed: 06/21/2021 10:04:42 AM By: Robin Harrell ?Entered By: Robin Harrell on 06/18/2021 14:34:35 ?-------------------------------------------------------------------------------- ?Compression Therapy Details ?Patient Name: Date of Service: ?[facility] RD, [name] 06/18/2021 2:00 PM ?Medical Record Number: [id number] ?Patient Account Number: [id number] ?Date of Birth/Sex: Treating Harrell: ?[birth year] (88 y.o. Robin Harrell, Robin Harrell ?Primary Care Jakaya Jacobowitz: Robin Harrell Other Clinician: ?Referring Robin Harrell: ?Treating Robin Harrell/Extender: Robin Harrell ?Robin Harrell ?Weeks in Treatment: 4 ?Compression Therapy Performed for Wound Assessment: Wound  #1 Right,Anterior Lower Leg ?Performed By: Clinician Robin Hurst, Harrell ?Compression Type: Three Layer ?Post Procedure Diagnosis ?Same as Pre-procedure ?Electronic Signature(s) ?Signed: 06/21/2021 5:50:11 PM By: Robin Harrell ?Entered By: Robin Harrell on 06/18/2021 14:58:50 ?-------------------------------------------------------------------------------- ?Encounter Discharge Information Details ?Patient Name: ?Date of Service: ?[facility] RD, [name] 06/18/2021 2:00 PM ?Medical Record Number: [id number] ?Patient Account Number: [id number] ?Date of Birth/Sex: ?Treating Harrell: ?March 29, 1933 (88 y.o. F) Robin Harrell ?Primary Care Robin Harrell: Robin Harrell ?Other Clinician: ?Referring Robin Harrell: ?Treating Densel Kronick/Extender: Robin Harrell ?Robin Harrell ?Weeks in Treatment: 4 ?Encounter Discharge Information Items ?Discharge Condition: Stable ?Ambulatory Status: Wheelchair ?Discharge Destination: Home ?Transportation: Private Auto ?Accompanied By: Daughter ?Schedule Follow-up Appointment: Yes ?Clinical Summary of Care: Patient Declined ?Electronic Signature(s) ?Signed: 06/18/2021 4:15:21 PM By: Robin Harrell ?Entered By: Robin Harrell on 06/18/2021 16:14:02 ?-------------------------------------------------------------------------------- ?Lower Extremity Assessment Details ?Patient Name: ?Date of Service: ?[facility] RD, [name] 06/18/2021 2:00 PM ?Medical Record Number: [id number] ?Patient Account Number: [id number] ?Date of Birth/Sex: ?Treating Harrell: ?08/23/1933 (88 y.o. Robin Harrell, Robin Harrell ?Primary Care Robin Harrell: Robin Harrell ?Other Clinician: ?Referring Robin Harrell: ?Treating Robin Harrell/Extender: Robin Harrell ?Robin Harrell ?Weeks in Treatment: 4 ?Edema Assessment ?Assessed: [Left: No] [Right: No] ?Edema: [Left: Ye] [Right: s] ?Calf ?Left: Right: ?Point of Measurement: 31 cm From Medial Instep 28 cm ?Ankle ?Left: Right: ?Point of Measurement: 10 cm From Medial Instep 19 cm ?Vascular Assessment ?Pulses: ?Dorsalis  Pedis ?Palpable: [Right:Yes] ?Electronic Signature(s) ?Signed: 06/21/2021 5:50:11 PM By: Robin Harrell ?Entered By: Robin Harrell on 06/18/2021 14:55:40 ?-------------------------------------------------------------------------------- ?Multi Wound Chart Details ?Patient Name: ?Date of Service: ?[facility] RD, [name] 06/18/2021 2:00 PM ?Medical Record Number: [id number] ?Patient Account Number: [id number] ?Date of Birth/Sex: ?Treating Harrell: ?[birth year] (88 y.o. F) Robin Harrell ?Primary Care Robin Harrell: Robin Harrell ?Other Clinician: ?Referring Robin Harrell: ?Treating Robin Harrell/Extender: Robin Harrell ?Robin Harrell ?Weeks in Treatment: 4 ?Vital Signs ?Height(in): 62 ?Pulse(bpm): 92 ?Weight(lbs): 115 ?Blood Pressure(mmHg): 104/67 ?Body Mass Index(BMI): 21 ?Temperature(??F): 98.3 ?Respiratory Rate(breaths/min): 18 ?Photos: [N/A:N/A] ?Right, Anterior Lower Leg  N/A N/A ?Wound Location: ?Trauma N/A N/A ?Wounding Event: ?Venous Leg Ulcer N/A N/A ?Primary Etiology: ?Glaucoma, Deep Vein Thrombosis, N/A N/A ?Comorbid History: ?Hypertension, Peripheral Venous ?Disease, Osteoarthritis ?04/23/2021 N/A N/A ?Date Acquired: ?4 N/A N/A ?Weeks of Treatment: ?Open N/A N/A ?Wound Status: ?No N/A N/A ?Wound Recurrence: ?0.1x0.1x0.1 N/A N/A ?Measurements L x W x D (cm) ?0.008 N/A N/A ?A (cm?) : ?rea ?0.001 N/A N/A ?Volume (cm?) : ?99.80% N/A N/A ?% Reduction in Area: ?99.80% N/A N/A ?% Reduction in Volume: ?Full Thickness Without Exposed N/A N/A ?Classification: ?Support Structures ?Small N/A N/A ?Exudate Amount: ?Serosanguineous N/A N/A ?Exudate Type: ?red, brown N/A N/A ?Exudate Color: ?Flat and Intact N/A N/A ?Wound Margin: ?Large (67-100%) N/A N/A ?Granulation Amount: ?Red, Pink N/A N/A ?Granulation Quality: ?None Present (0%) N/A N/A ?Necrotic Amount: ?Fat Layer (Subcutaneous Tissue): Yes N/A N/A ?Exposed Structures: ?Fascia: No ?Tendon: No ?Muscle: No ?Joint: No ?Bone: No ?Large (67-100%) N/A N/A ?Epithelialization: ?Compression  Therapy N/A N/A ?Procedures Performed: ?Treatment Notes ?Electronic Signature(s) ?Signed: 06/18/2021 3:05:06 PM By: Robin Maudlin MD FACS ?Signed: 06/18/2021 4:15:21 PM By: Robin Harrell ?Entered By: Robin Harrell on 06/18/2021 15:05:06 ?-------------------------------------------------------------------------------- ?Multi-Disciplinary Care Plan Details ?Patient Name: ?Date of Service: ?[facility] RD, [name] 06/18/2021 2:00 PM ?Medical Record Number: [id number] ?Patient Account Number: [id number] ?Date of Birth/Sex: ?Treating Harrell: ?1933/03/26 (88 y.o. Robin Harrell, Robin Harrell ?Primary Care Daenerys Buttram: ?Other Clinician: ?Robin Harrell ?Referring Minda Faas: ?Treating Marveline Profeta/Extender: Robin Harrell ?Robin Harrell ?Weeks in Treatment: 4 ?Multidisciplinary Care Plan reviewed with physician ?Active Inactive ?Abuse / Safety / Falls / Self Care Management ?Nursing Diagnoses: ?History of Falls ?Potential for injury related to falls ?Goals: ?Patient will not experience any injury related to falls ?Date Initiated: 05/21/2021 ?Target Resolution Date: 07/16/2021 ?Goal Status: Active ?Patient/caregiver will verbalize/demonstrate measures taken to prevent injury and/or falls ?Date Initiated: 05/21/2021 ?Target Resolution Date: 07/16/2021 ?Goal Status: Active ?Interventions: ?Assess Activities of Daily Living upon admission and as needed ?Assess fall risk on admission and as needed ?Assess: immobility, friction, shearing, incontinence upon admission and as needed ?Assess impairment of mobility on admission and as needed per policy ?Assess personal safety and home safety (as indicated) on admission and as needed ?Assess self care needs on admission and as needed ?Provide education on fall prevention ?Provide education on personal and home safety ?Notes: ?Venous Leg Ulcer ?Nursing Diagnoses: ?Knowledge deficit related to disease process and management ?Goals: ?Patient will maintain optimal edema control ?Date Initiated:  05/21/2021 ?Target Resolution Date: 07/16/2021 ?Goal Status: Active ?Patient/caregiver will verbalize understanding of disease process and disease management ?Date Initiated: 05/21/2021 ?Target Resolution Date: 07/16/2021 ?

## 2021-06-21 NOTE — Progress Notes (Signed)
?  Subjective:  ?Patient ID: Robin Harrell, female    DOB: 27-Oct-1933,  MRN: [id number] ? ?Robin Harrell presents to clinic today for at risk foot care with h/o clotting disorder and corn(s) left 4th diigt and painful thick toenails that are difficult to trim. Painful toenails interfere with ambulation. Aggravating factors include wearing enclosed shoe gear. Pain is relieved with periodic professional debridement. Painful corns are aggravated when weightbearing when wearing enclosed shoe gear. Pain is relieved with periodic professional debridement. ? ?Patient is accompanied by her caregiver on today's visit.  ? ?She is seeing is having right leg wrapped for skin tear. Had fall recently and was seen in ED. ? ?PCP is [name], MD , and last visit was 05/10/2021. ? ?Allergies  ?Allergen Reactions  ? Azithromycin Nausea Only  ? Codeine Nausea Only  ? Erythromycin Nausea And Vomiting  ? ? ?Review of Systems: Negative except as noted in the HPI. ?Objective:  ? ?Constitutional Robin Harrell is a pleasant 88 y.o. Caucasian female, WD, WN in NAD. AAO x 3.   ?Vascular CFT <3 seconds b/l LE. Palpable DP/PT pulses b/l LE. Digital hair absent b/l. Skin temperature gradient WNL b/l. No pain with calf compression b/l. No edema noted b/l. No cyanosis or clubbing noted b/l LE. Varicosities present b/l. No cyanosis or clubbing noted.  ?Neurologic Normal speech. Oriented to person, place, and time. Protective sensation intact 5/5 intact bilaterally with 10g monofilament b/l. Vibratory sensation intact b/l.  ?Dermatologic Pedal integument with normal turgor, texture and tone b/l LE. Leg wrap RLE clean, dry and intact. No interdigital macerations b/l. Toenails 1-5 b/l elongated, thickened, discolored with subungual debris. +Tenderness with dorsal palpation of nailplates. Hyperkeratotic lesion(s) noted L 4th toe. Preulcerative lesion submetatarsal head 1 left foot.Marland Kitchen No erythema, no edema, no drainage, no flocculence  noted.    ?Orthopedic: Normal muscle strength 5/5 to all lower extremity muscle groups bilaterally. No pain, crepitus or joint limitation noted with ROM b/l LE. 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 Patient ambulates with rollator assistance.  ? ?Radiographs: None ?Assessment:  ? ?1. Pain due to onychomycosis of toenail   ?2. Corns   ?3. Pre-ulcerative calluses   ?4. Factor V Leiden (Robin Harrell)   ?5. Coagulation defect (Robin Harrell)   ? ?Plan:  ?Patient was evaluated and treated and all questions answered. ?Consent given for treatment as described below: ?-Felt aperture pad applied to insert of left shoe for submet head 1 preulcerative lesion. ?-Toenails 1-5 b/l were debrided in length and girth with sterile nail nippers and dremel without iatrogenic bleeding.  ?-Corn(s) L 4th toe pared utilizing sterile scalpel blade without complication or incident. Total number debrided=1. ?-Preulcerative lesion pared submet head 1 left foot. Total number pared=1. ?-Continue toe cap to afftected digits daily for protection. ?-Patient/POA to call should there be question/concern in the interim. ? ?Return in about 9 weeks (around 08/16/2021). ? ?Marzetta Board, DPM ? ?

## 2021-06-23 ENCOUNTER — Ambulatory Visit (INDEPENDENT_AMBULATORY_CARE_PROVIDER_SITE_OTHER): Payer: Medicare Other | Admitting: Family Medicine

## 2021-06-23 ENCOUNTER — Encounter: Payer: Self-pay | Admitting: Family Medicine

## 2021-06-23 ENCOUNTER — Telehealth: Payer: Self-pay

## 2021-06-23 VITALS — BP 118/62 | HR 92 | Temp 97.6°F | Ht 64.0 in | Wt 112.2 lb

## 2021-06-23 DIAGNOSIS — M4010 Other secondary kyphosis, site unspecified: Secondary | ICD-10-CM | POA: Diagnosis not present

## 2021-06-23 DIAGNOSIS — S0003XD Contusion of scalp, subsequent encounter: Secondary | ICD-10-CM

## 2021-06-23 DIAGNOSIS — S060X0D Concussion without loss of consciousness, subsequent encounter: Secondary | ICD-10-CM | POA: Diagnosis not present

## 2021-06-23 DIAGNOSIS — R531 Weakness: Secondary | ICD-10-CM

## 2021-06-23 DIAGNOSIS — M810 Age-related osteoporosis without current pathological fracture: Secondary | ICD-10-CM

## 2021-06-23 DIAGNOSIS — Z20822 Contact with and (suspected) exposure to covid-19: Secondary | ICD-10-CM | POA: Diagnosis not present

## 2021-06-23 DIAGNOSIS — W19XXXD Unspecified fall, subsequent encounter: Secondary | ICD-10-CM

## 2021-06-23 DIAGNOSIS — Z7901 Long term (current) use of anticoagulants: Secondary | ICD-10-CM

## 2021-06-23 DIAGNOSIS — M4013 Other secondary kyphosis, cervicothoracic region: Secondary | ICD-10-CM

## 2021-06-23 DIAGNOSIS — Z9181 History of falling: Secondary | ICD-10-CM

## 2021-06-23 NOTE — Patient Instructions (Signed)
Please follow up as scheduled for your next visit with me: 11/17/2021  ? ?I will order PT and the hospital bed once we get more details from [facility].  ? ?If you have any questions or concerns, please don't hesitate to send me a message via MyChart or call the office at [phone]. Thank you for visiting with [place] today! It's our pleasure caring for you.  ?

## 2021-06-23 NOTE — Progress Notes (Unsigned)
Please see note. I have placed referral. Needs to be faxed or sent to [email or web address].  ? ? ?

## 2021-06-23 NOTE — Progress Notes (Signed)
? ? ?Subjective  ?CC:  ?Chief Complaint  ?Patient presents with  ? Fall  ?  Pt stated that she had a ladder fall on her head while attempting to put on her shoe. Was seen in  ?ED  ? ? ?HPI: Robin Harrell is a 88 y.o. female who presents to the office today to address the problems listed above in the chief complaint. ?88 year old female with osteoporosis, kyphosis, on blood thinner had a fall in her kitchen last week.  Date of injury was April 29.  She was getting down from her kitchen table chair and fell.  A nearby stepladder landed on her back and head.  Reviewed emergency room records.  She had negative brain CT, lumbar thoracic and cervical spine CTs and negative pelvic x-rays for injury.  Diagnosed with concussion.  Since being home, she has felt okay.  She is in a wheelchair today because it is easier but has been using a walker in the home.  She admits she is getting weaker.  Unable to stand unassisted from a seated position.  Balance is a little less good.  She denies any headaches or confusion.  No palpitations or chest pain. ?She is moving to [facility] assisted living facility weeks.  She would like a hospital bed if possible.  She has severe kyphosis and generalized weakness.  It is difficult to be comfortable in a flat bed because of her medical conditions. ? ?Assessment  ?1. Fall, subsequent encounter   ?2. Concussion without loss of consciousness, subsequent encounter   ?3. Hematoma of scalp, subsequent encounter   ?4. Generalized weakness   ?5. 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 ?  ?Plan  ?Fall with concussion and hematoma: Fortunately doing well.  No subsequent side effects from concussion. ?Discussed gait abnormality, weakness, and poor balance.  Will order PT data to would.  Patient's son-in-law thinks they have in-house PT.  He will find out and that I can order it.  Patient is agreeable ?Hospital bed is indicated due to severe kyphosis. ? ?Follow up: As scheduled ?07/06/2021 ? ?No orders of  the defined types were placed in this encounter. ? ?No orders of the defined types were placed in this encounter. ? ?  ? ?I reviewed the patients updated PMH, FH, and SocHx.  ?  ?Patient Active Problem List  ? Diagnosis Date Noted  ? Hypothyroidism (acquired) 09/02/2014  ?  Priority: High  ? Mixed hyperlipidemia 11/19/2012  ?  Priority: High  ? Long term (current) use of anticoagulants 05/10/2012  ?  Priority: High  ? Diastolic dysfunction 16/11/9602  ?  Priority: High  ? Factor V deficiency ([place]) 02/09/2010  ?  Priority: High  ? Osteoporosis 02/09/2010  ?  Priority: High  ? Essential hypertension 11/15/2007  ?  Priority: High  ? Chronic venous stasis dermatitis of both lower extremities 12/31/2018  ?  Priority: Medium   ? Aortic stenosis, mild 02/07/2017  ?  Priority: Medium   ? Chronic idiopathic thrombocytopenia ([place]) 04/14/2016  ?  Priority: Medium   ? Essential tremor 09/03/2015  ?  Priority: Medium   ? Edema of right lower extremity 02/01/2013  ?  Priority: Medium   ? Colon polyp 10/15/2012  ?  Priority: Medium   ? Insomnia 10/11/2011  ?  Priority: Medium   ? Osteoarthritis, multiple sites 06/30/2011  ?  Priority: Medium   ? DJD (degenerative joint disease), lumbar 10/14/2009  ?  Priority: Medium   ? Glaucoma  01/29/2008  ?  Priority: Medium   ? History of DVT (deep vein thrombosis) 11/15/2007  ?  Priority: Medium   ? GERD 11/15/2007  ?  Priority: Medium   ? Chronic cough 11/15/2007  ?  Priority: Medium   ? Primary osteoarthritis of both knees 11/18/2019  ?  Priority: Low  ? 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  Priority: Low  ? History of recurrent UTIs 01/26/2017  ?  Priority: Low  ? History of respiratory failure 11/11/2014  ?  Priority: Low  ? Seasonal and perennial allergic rhinitis 11/15/2007  ?  Priority: Low  ? Bronchitis 03/19/2021  ? Urinary frequency 01/22/2021  ? Traumatic coccydynia 11/04/2020  ? Chronic rhinitis 09/04/2008  ? Upper airway cough syndrome 11/15/2007  ? ?Current Meds   ?Medication Sig  ? acetaminophen (TYLENOL) 500 MG tablet Take 1,000 mg by mouth every 6 (six) hours as needed for moderate pain.  ? atorvastatin (LIPITOR) 10 MG tablet TAKE 1 TABLET (10 MG TOTAL) BY MOUTH DAILY AT 6 PM.  ? azelastine (ASTELIN) 0.1 % nasal spray Place 2 sprays into both nostrils 2 (two) times daily. Use in each nostril as directed  ? brimonidine (ALPHAGAN) 0.2 % ophthalmic solution Place 1 drop into both eyes 2 (two) times daily.  ? Calcium-Phosphorus-Vitamin D (CALCIUM/D3 ADULT GUMMIES) 200-96.6-200 MG-MG-UNIT CHEW Chew 2 tablets by mouth daily.  ? fluticasone (FLONASE) 50 MCG/ACT nasal spray SPRAY 2 SPRAYS INTO EACH NOSTRIL EVERY DAY (Patient taking differently: Place 2 sprays into both nostrils daily.)  ? ipratropium (ATROVENT) 0.03 % nasal spray Place 2 sprays into the nose 3 (three) times daily as needed.  ? latanoprost (XALATAN) 0.005 % ophthalmic solution Place 1 drop into both eyes at bedtime.   ? levocetirizine (XYZAL) 5 MG tablet TAKE 1 TABLET BY MOUTH EVERY DAY IN THE EVENING  ? levothyroxine (SYNTHROID) 25 MCG tablet Take 1 tablet (25 mcg total) by mouth daily before breakfast.  ? losartan (COZAAR) 50 MG tablet TAKE 1 TABLET BY MOUTH EVERY DAY (Patient taking differently: Take 50 mg by mouth daily.)  ? metoprolol tartrate (LOPRESSOR) 25 MG tablet Take 1 tablet (25 mg total) by mouth 2 (two) times daily.  ? multivitamin-iron-minerals-folic acid (CENTRUM) chewable tablet Chew 1 tablet by mouth daily.  ? vitamin E 1000 UNIT capsule Take 1,000 Units by mouth daily.  ? warfarin (COUMADIN) 7.5 MG tablet TAKE 1/2 TO 1 TABLET BY MOUTH DAILY AS DIRECTED (Patient taking differently: Take 7.5 mg by mouth daily. 1/2 tab M,W, F and 1 tablet on T,Th,S,Su)  ? ? ?Allergies: ?Patient is allergic to azithromycin, codeine, and erythromycin. ?Family History: ?Patient family history includes Allergic rhinitis in her sister; Breast cancer (age of onset: 60) in her daughter; Cirrhosis in her father; Coronary  artery disease in her maternal grandfather and paternal grandfather; Heart disease in her paternal grandmother; Lung cancer in her father; Skin cancer in her brother; Throat cancer in her maternal grandfather and paternal grandfather. ?Social History:  ?Patient  reports that she quit smoking about 63 years ago. Her smoking use included cigarettes. She has never used smokeless tobacco. She reports current alcohol use. She reports that she does not use drugs. ? ?Review of Systems: ?Constitutional: Negative for fever malaise or anorexia ?Cardiovascular: negative for chest pain ?Respiratory: negative for SOB or persistent cough ?Gastrointestinal: negative for abdominal pain ? ?Objective  ?Vitals: BP 118/62   Pulse 92   Temp 97.6 ?F (36.4 ?C)   Ht '5\' 4"'$  (1.626  m)   Wt 112 lb 3.2 oz (50.9 kg)   LMP  (LMP Unknown)   SpO2 97%   BMI 19.26 kg/m?  ?General: no acute distress , A&Ox3, sitting comfortably in wheelchair.  Kyphotic ?HEENT: PEERL, conjunctiva normal, neck is supple, left forehead with contusion, left parietal region with hematoma.  No active bleeding ?Cardiovascular:  RRR loud systolic murmur ?Respiratory:  Good breath sounds bilaterally, CTAB with normal respiratory effort ?Skin:  Warm, no rashes ? ? ? ?Commons side effects, risks, benefits, and alternatives for medications and treatment plan prescribed today were discussed, and the patient expressed understanding of the given instructions. Patient is instructed to call or message via MyChart if he/she has any questions or concerns regarding our treatment plan. No barriers to understanding were identified. We discussed Red Flag symptoms and signs in detail. Patient expressed understanding regarding what to do in case of urgent or emergency type symptoms.  ?Medication list was reconciled, printed and provided to the patient in AVS. Patient instructions and summary information was reviewed with the patient as documented in the AVS. ?This note was prepared  with assistance of Systems analyst. Occasional wrong-word or sound-a-like substitutions may have occurred due to the inherent limitations of voice recognition software ? ?This visit occurred during the S

## 2021-06-23 NOTE — Telephone Encounter (Signed)
LVM for daughter to let her know that her mother was in for an appt today and dropped off some papers for Jonni Sanger to fill out. Jonni Sanger has completed the paperwork but I can not fax the papers until I have a signature on it. I will place the papers up front at check in for someone to sign. ?

## 2021-06-24 ENCOUNTER — Encounter (HOSPITAL_BASED_OUTPATIENT_CLINIC_OR_DEPARTMENT_OTHER): Payer: Medicare Other | Attending: General Surgery | Admitting: General Surgery

## 2021-06-24 DIAGNOSIS — Z7901 Long term (current) use of anticoagulants: Secondary | ICD-10-CM | POA: Insufficient documentation

## 2021-06-24 DIAGNOSIS — I350 Nonrheumatic aortic (valve) stenosis: Secondary | ICD-10-CM | POA: Diagnosis not present

## 2021-06-24 DIAGNOSIS — I5189 Other ill-defined heart diseases: Secondary | ICD-10-CM | POA: Insufficient documentation

## 2021-06-24 DIAGNOSIS — L97812 Non-pressure chronic ulcer of other part of right lower leg with fat layer exposed: Secondary | ICD-10-CM | POA: Insufficient documentation

## 2021-06-24 DIAGNOSIS — I10 Essential (primary) hypertension: Secondary | ICD-10-CM | POA: Insufficient documentation

## 2021-06-24 DIAGNOSIS — Z96651 Presence of right artificial knee joint: Secondary | ICD-10-CM | POA: Insufficient documentation

## 2021-06-24 DIAGNOSIS — Z87891 Personal history of nicotine dependence: Secondary | ICD-10-CM | POA: Insufficient documentation

## 2021-06-24 DIAGNOSIS — Z20822 Contact with and (suspected) exposure to covid-19: Secondary | ICD-10-CM | POA: Diagnosis not present

## 2021-06-24 DIAGNOSIS — I872 Venous insufficiency (chronic) (peripheral): Secondary | ICD-10-CM | POA: Insufficient documentation

## 2021-06-24 DIAGNOSIS — Z86718 Personal history of other venous thrombosis and embolism: Secondary | ICD-10-CM | POA: Diagnosis not present

## 2021-06-25 DIAGNOSIS — Z20822 Contact with and (suspected) exposure to covid-19: Secondary | ICD-10-CM | POA: Diagnosis not present

## 2021-06-25 NOTE — Telephone Encounter (Signed)
Patient daughter called in and states they are just waiting on the order for the hospital bed. If they could just be updated on mychart once it has been sent in. So they can call [facility].  ?

## 2021-06-25 NOTE — Progress Notes (Signed)
Robin Harrell, Robin Harrell ([id number]) ?Visit Report for 06/24/2021 ?Arrival Information Details ?Patient Name: Date of Service: ?[place], Griffith J. 06/24/2021 12:45 PM ?Medical Record Number: [id number] ?Patient Account Number: [id number] ?Date of Birth/Sex: Treating RN: ?25-Mar-1933 (88 y.o. F) ?Primary Care Anam Bobby: Billey Chang Other Clinician: ?Referring Nolene Rocks: ?Treating Navin Dogan/Extender: Fredirick Maudlin ?Billey Chang ?Weeks in Treatment: 4 ?Visit Information History Since Last Visit ?Added or deleted any medications: No ?Patient Arrived: Wheel Chair ?Any new allergies or adverse reactions: No ?Arrival Time: 13:02 ?Had a fall or experienced change in Yes ?Accompanied By: self ?activities of daily living that may affect ?Transfer Assistance: None ?risk of falls: ?Patient Identification Verified: Yes ?Signs or symptoms of abuse/neglect since last visito No ?Secondary Verification Process Completed: Yes ?Hospitalized since last visit: No ?Patient Requires Transmission-Based Precautions: No ?Implantable device outside of the clinic excluding No ?Patient Has Alerts: Yes ?cellular tissue based products placed in the center ?Patient Alerts: Patient on Blood Thinner since last visit: ?Has Dressing in Place as Prescribed: Yes ?Pain Present Now: No ?Notes ?patients son in law states his mom had fall getting out of a chair and a latter was beside her and she got tripped up and fell her face is bruised very bad and she ?came in a wheelchair today an she usually walks in ?Electronic Signature(s) ?Signed: 06/25/2021 10:39:22 AM By: [name] ?Entered By: [name] on 06/24/2021 13:09:59 ?-------------------------------------------------------------------------------- ?Clinic Level of Care Assessment Details ?Patient Name: Date of Service: ?[place], [name] 06/24/2021 12:45 PM ?Medical Record Number: [id number] ?Patient Account Number: [id number] ?Date of Birth/Sex: Treating RN: ?Dec 24, 1933 (88 y.o. Benjamine Sprague,  [place] ?Primary Care Gabriellah Rabel: Billey Chang Other Clinician: ?Referring Kenlie Seki: ?Treating Kendria Halberg/Extender: Fredirick Maudlin ?Billey Chang ?Weeks in Treatment: 4 ?Clinic Level of Care Assessment Items ?TOOL 4 Quantity Score ?X- 1 0 ?Use when only an EandM is performed on FOLLOW-UP visit ?ASSESSMENTS - Nursing Assessment / Reassessment ?X- 1 10 ?Reassessment of Co-morbidities (includes updates in patient status) ?X- 1 5 ?Reassessment of Adherence to Treatment Plan ?ASSESSMENTS - Wound and Skin A ssessment / Reassessment ?X - Simple Wound Assessment / Reassessment - one wound 1 5 ?'[]'$  - 0 ?Complex Wound Assessment / Reassessment - multiple wounds ?'[]'$  - 0 ?Dermatologic / Skin Assessment (not related to wound area) ?ASSESSMENTS - Focused Assessment ?'[]'$  - 0 ?Circumferential Edema Measurements - multi extremities ?'[]'$  - 0 ?Nutritional Assessment / Counseling / Intervention ?X- 1 5 ?Lower Extremity Assessment (monofilament, tuning fork, pulses) ?'[]'$  - 0 ?Peripheral Arterial Disease Assessment (using hand held doppler) ?ASSESSMENTS - Ostomy and/or Continence Assessment and Care ?'[]'$  - 0 ?Incontinence Assessment and Management ?'[]'$  - 0 ?Ostomy Care Assessment and Management (repouching, etc.) ?PROCESS - Coordination of Care ?X - Simple Patient / Family Education for ongoing care 1 15 ?'[]'$  - 0 ?Complex (extensive) Patient / Family Education for ongoing care ?X- 1 10 ?Staff obtains Consents, Records, T Results / Process Orders ?est ?'[]'$  - 0 ?Staff telephones HHA, Nursing Homes / Clarify orders / etc ?'[]'$  - 0 ?Routine Transfer to another Facility (non-emergent condition) ?'[]'$  - 0 ?Routine Hospital Admission (non-emergent condition) ?'[]'$  - 0 ?New Admissions / Biomedical engineer / Ordering NPWT Apligraf, etc. ?, ?'[]'$  - 0 ?Emergency Hospital Admission (emergent condition) ?X- 1 10 ?Simple Discharge Coordination ?'[]'$  - 0 ?Complex (extensive) Discharge Coordination ?PROCESS - Special Needs ?'[]'$  - 0 ?Pediatric / Minor Patient  Management ?'[]'$  - 0 ?Isolation Patient Management ?'[]'$  - 0 ?Hearing / Language / Visual special needs ?'[]'$  - 0 ?  Assessment of Community assistance (transportation, D/C planning, etc.) ?'[]'$  - 0 ?Additional assistance / Altered mentation ?'[]'$  - 0 ?Support Surface(s) Assessment (bed, cushion, seat, etc.) ?INTERVENTIONS - Wound Cleansing / Measurement ?X - Simple Wound Cleansing - one wound 1 5 ?'[]'$  - 0 ?Complex Wound Cleansing - multiple wounds ?X- 1 5 ?Wound Imaging (photographs - any number of wounds) ?'[]'$  - 0 ?Wound Tracing (instead of photographs) ?X- 1 5 ?Simple Wound Measurement - one wound ?'[]'$  - 0 ?Complex Wound Measurement - multiple wounds ?INTERVENTIONS - Wound Dressings ?'[]'$  - 0 ?Small Wound Dressing one or multiple wounds ?'[]'$  - 0 ?Medium Wound Dressing one or multiple wounds ?'[]'$  - 0 ?Large Wound Dressing one or multiple wounds ?'[]'$  - 0 ?Application of Medications - topical ?'[]'$  - 0 ?Application of Medications - injection ?INTERVENTIONS - Miscellaneous ?'[]'$  - 0 ?External ear exam ?'[]'$  - 0 ?Specimen Collection (cultures, biopsies, blood, body fluids, etc.) ?'[]'$  - 0 ?Specimen(s) / Culture(s) sent or taken to Lab for analysis ?'[]'$  - 0 ?Patient Transfer (multiple staff / Civil Service fast streamer / Similar devices) ?'[]'$  - 0 ?Simple Staple / Suture removal (25 or less) ?'[]'$  - 0 ?Complex Staple / Suture removal (26 or more) ?'[]'$  - 0 ?Hypo / Hyperglycemic Management (close monitor of Blood Glucose) ?'[]'$  - 0 ?Ankle / Brachial Index (ABI) - do not check if billed separately ?X- 1 5 ?Vital Signs ?Has the patient been seen at the hospital within the last three years: Yes ?Total Score: 80 ?Level Of Care: New/Established - Level 3 ?Electronic Signature(s) ?Signed: 06/24/2021 6:01:57 PM By: [name] RN, BSN ?Entered By: [name] on 06/24/2021 17:08:10 ?-------------------------------------------------------------------------------- ?Encounter Discharge Information Details ?Patient Name: Date of Service: ?[place], [place] J. 06/24/2021 12:45  PM ?Medical Record Number: [id number] ?Patient Account Number: [id number] ?Date of Birth/Sex: Treating RN: ?05-Nov-1933 (88 y.o. Benjamine Sprague, Shatara ?Primary Care Jaimeson Gopal: Billey Chang Other Clinician: ?Referring Milbert Bixler: ?Treating Zuriel Yeaman/Extender: Fredirick Maudlin ?Billey Chang ?Weeks in Treatment: 4 ?Encounter Discharge Information Items ?Discharge Condition: Stable ?Ambulatory Status: Wheelchair ?Discharge Destination: Home ?Transportation: Private Auto ?Accompanied By: son in law ?Schedule Follow-up Appointment: Yes ?Clinical Summary of Care: Patient Declined ?Electronic Signature(s) ?Signed: 06/24/2021 6:01:57 PM By: [name] RN, BSN ?Entered By: [name] on 06/24/2021 17:09:04 ?-------------------------------------------------------------------------------- ?Lower Extremity Assessment Details ?Patient Name: Date of Service: ?[place], [place] J. 06/24/2021 12:45 PM ?Medical Record Number: [id number] ?Patient Account Number: [id number] ?Date of Birth/Sex: Treating RN: ?January 17, 1934 (88 y.o. Benjamine Sprague, Shatara ?Primary Care Edoardo Laforte: Billey Chang Other Clinician: ?Referring Deacon Gadbois: ?Treating Natacha Jepsen/Extender: Fredirick Maudlin ?Billey Chang ?Weeks in Treatment: 4 ?Edema Assessment ?Assessed: [Left: No] [Right: No] ?Edema: [Left: Ye] [Right: s] ?Calf ?Left: Right: ?Point of Measurement: 31 cm From Medial Instep 28 cm ?Ankle ?Left: Right: ?Point of Measurement: 10 cm From Medial Instep 19 cm ?Vascular Assessment ?Pulses: ?Dorsalis Pedis ?Palpable: [Right:Yes] ?Electronic Signature(s) ?Signed: 06/24/2021 6:01:57 PM By: [name] RN, BSN ?Entered By: [name] on 06/24/2021 13:20:40 ?-------------------------------------------------------------------------------- ?Multi Wound Chart Details ?Patient Name: ?Date of Service: ?[place], [place] J. 06/24/2021 12:45 PM ?Medical Record Number: [id number] ?Patient Account Number: [id number] ?Date of Birth/Sex: ?Treating RN: ?1933/05/03 (88 y.o. F) ?Primary  Care Oceane Fosse: Billey Chang ?Other Clinician: ?Referring Darnella Zeiter: ?Treating Zailyn Rowser/Extender: Fredirick Maudlin ?Billey Chang ?Weeks in Treatment: 4 ?Vital Signs ?Height(in): 62 ?Pulse(bpm): 78 ?Weight(lbs): 115 ?Bloo

## 2021-06-25 NOTE — Progress Notes (Signed)
MALISHA, MABEY ([id number]) ?Visit Report for 06/24/2021 ?Chief Complaint Document Details ?Patient Name: Date of Service: ?[place], [place] J. 06/24/2021 12:45 PM ?Medical Record Number: [id number] ?Patient Account Number: [id number] ?Date of Birth/Sex: Treating RN: ?[birth year] (88 y.o. F) ?Primary Care Provider: Billey Chang Other Clinician: ?Referring Provider: ?Treating Provider/Extender: Fredirick Maudlin ?Billey Chang ?Weeks in Treatment: 4 ?Information Obtained from: Patient ?Chief Complaint ?Patient presents for treatment of an open ulcer due to venous insufficiency ?Electronic Signature(s) ?Signed: 06/24/2021 1:33:33 PM By: Fredirick Maudlin MD FACS ?Entered By: Fredirick Maudlin on 06/24/2021 13:33:33 ?-------------------------------------------------------------------------------- ?HPI Details ?Patient Name: Date of Service: ?[place], [place] J. 06/24/2021 12:45 PM ?Medical Record Number: [id number] ?Patient Account Number: [id number] ?Date of Birth/Sex: Treating RN: ?09/27/1933 (88 y.o. F) ?Primary Care Provider: Billey Chang Other Clinician: ?Referring Provider: ?Treating Provider/Extender: Fredirick Maudlin ?Billey Chang ?Weeks in Treatment: 4 ?History of Present Illness ?HPI Description: ADMISSION ?05/21/2021: ?This is an 88 year old woman with a fairly unremarkable past medical history aside from essential hypertension, mild aortic stenosis, and mild venous stasis. ?She is not diabetic, nor does she smoke. 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 At the beginning of the month, a frozen dinner fell from the freezer and landed on her right leg. 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 Urgent care placed her on a 5-day course of Keflex, she does have a home health aide that comes 3 ?times a week. They have been applying Vaseline to the site. ABI on  the right today was 1.3. In addition to the main wound for which she is being seen, our ?intake nurse noted an area of skin denudement just distal to this main wound. It looks like it may have been a blister initially. The patient has not experienced ?any fevers or chills. She does not have significant pain at the wound site. She says that she does not wear compression stockings because they are too tight ?and she finds them difficult to don and doff. ?05/28/2021: The small distal wound has closed. The larger proximal wound is extremely clean with good granulation tissue at the base. No purulent drainage or ?slough accumulation. No concern for infection. ?06/04/2021: Although we had discussed ordering juxta lite stockings, it does not appear that these ever were ordered and they certainly have not arrived at the ?patient's residence. The wound is quite a bit smaller today with good granulation tissue at the base; it is nearly flush with the surrounding skin. ?06/11/2021: The patient does have her juxta lite stocking with her today. The wound has decreased by about half in diameter. It is clean with a good base of ?granulation tissue. ?06/18/2021: The wound is nearly closed. I have asked her to bring her juxta lite stockings with her next week. ?06/24/2021: Her wound is closed. Her juxta lite stockings are in hand. ?Electronic Signature(s) ?Signed: 06/24/2021 1:33:53 PM By: Fredirick Maudlin MD FACS ?Entered By: Fredirick Maudlin on 06/24/2021 13:33:53 ?-------------------------------------------------------------------------------- ?Physical Exam Details ?Patient Name: Date of Service: ?[place], [place] J. 06/24/2021 12:45 PM ?Medical Record Number: [id number] ?Patient Account Number: [id number] ?Date of Birth/Sex: Treating RN: ?December 13, 1933 (88 y.o. F) ?Primary Care Provider: Billey Chang Other Clinician: ?Referring Provider: ?Treating Provider/Extender: Fredirick Maudlin ?Billey Chang ?Weeks in Treatment: 4 ?Constitutional ?. .  . . No acute distress. ?Respiratory ?Normal work of breathing on room air. ?Notes ?  06/24/2021: Her wound is closed. ?Electronic Signature(s) ?Signed: 06/24/2021 1:35:37 PM By: Fredirick Maudlin MD FACS ?Entered By: Fredirick Maudlin on 06/24/2021 13:35:36 ?-------------------------------------------------------------------------------- ?Physician Orders Details ?Patient Name: Date of Service: ?[place], [place] J. 06/24/2021 12:45 PM ?Medical Record Number: [id number] ?Patient Account Number: [id number] ?Date of Birth/Sex: Treating RN: ?11/21/1933 (88 y.o. 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 ?Edema Control - Lymphedema / SCD / Other ?Elevate legs to the level of the heart or above for 30 minutes daily and/or when sitting, a frequency of: - throughout the day ?Avoid standing for long periods of time. ?Exercise regularly ?Compression stocking or Garment 20-30 mm/Hg pressure to: - Juxtalite to both legs daily, apply first thing in the morning, take off before bed ?Electronic Signature(s) ?Signed: 06/24/2021 1:36:27 PM By: Fredirick Maudlin MD FACS ?Entered By: Fredirick Maudlin on 06/24/2021 13:36:27 ?-------------------------------------------------------------------------------- ?Problem List Details ?Patient Name: ?Date of Service: ?[place], [place] J. 06/24/2021 12:45 PM ?Medical Record Number: [id number] ?Patient Account Number: [id number] ?Date of Birth/Sex: ?Treating  RN: ?1933/05/26 (88 y.o. Benjamine Sprague, Shatara ?Primary Care Provider: Billey Chang ?Other Clinician: ?Referring Provider: ?Treating Provider/Extender: Fredirick Maudlin ?Billey Chang ?Weeks in Treatment: 4 ?Active Problems ?ICD-10 ?Encounter ?Code Description Active Date MDM ?Diagnosis ?H88.502 Non-pressure chronic ulcer of other part of right lower leg with fat layer 05/21/2021 No Yes ?exposed ?I87.2 Venous insufficiency (chronic) (peripheral) 05/21/2021 No Yes ?D74.128 Personal history of other venous thrombosis and embolism 05/21/2021 No Yes ?I51.89 Other ill-defined heart diseases 05/21/2021 No Yes ?Inactive Problems ?ICD-10 ?Code Description Active Date Inactive Date ?L97.811 Non-pressure chronic ulcer of other part of right lower leg limited to breakdown of skin 05/21/2021 05/21/2021 ?Resolved Problems ?Electronic Signature(s) ?Signed: 06/24/2021 1:32:40 PM By: Fredirick Maudlin MD FACS ?Entered By: Fredirick Maudlin on 06/24/2021 13:32:40 ?-------------------------------------------------------------------------------- ?Progress Note Details ?Patient Name: ?Date of Service: ?[place], [place] J. 06/24/2021 12:45 PM ?Medical Record Number: [id number] ?Patient Account Number: [id number] ?Date of Birth/Sex: ?Treating RN: ?[birth year] (88 y.o. F) ?Primary Care Provider: Billey Chang ?Other Clinician: ?Referring Provider: ?Treating Provider/Extender: Fredirick Maudlin ?Billey Chang ?Weeks in Treatment: 4 ?Subjective ?Chief Complaint ?Information obtained from Patient ?Patient presents for treatment of an open ulcer due to venous insufficiency ?History of Present Illness (HPI) ?ADMISSION ?05/21/2021: ?This is an 88 year old woman with a fairly unremarkable past medical history aside from essential hypertension, mild aortic stenosis, and mild venous stasis. ?She is not diabetic, nor does she smoke. 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 She subsequently ?suffered a PE and  is on lifelong anticoagulation as a result. At the beginning of the month, a frozen dinner fell from the freezer and landed on her right leg. 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

## 2021-06-26 DIAGNOSIS — Z20822 Contact with and (suspected) exposure to covid-19: Secondary | ICD-10-CM | POA: Diagnosis not present

## 2021-06-28 ENCOUNTER — Telehealth: Payer: Self-pay | Admitting: Family Medicine

## 2021-06-28 DIAGNOSIS — Z23 Encounter for immunization: Secondary | ICD-10-CM | POA: Diagnosis not present

## 2021-06-28 DIAGNOSIS — Z20822 Contact with and (suspected) exposure to covid-19: Secondary | ICD-10-CM | POA: Diagnosis not present

## 2021-06-28 NOTE — Telephone Encounter (Signed)
Son called re patient- Patient stated that the order for the hosp bed is not completed. We are missing DX and vitals - Patient son is asking if we can please reach out to [phone] to complete order. Stated we are to asked for costumer service.  ? ? ?I will be faxing over PT order [facility] per patient son to fax: [phone].  ?

## 2021-06-28 NOTE — Telephone Encounter (Signed)
Leda Quail is refaxing the orders and the LOV. ?

## 2021-06-30 NOTE — Telephone Encounter (Signed)
Faxed to [phone] on 06/28/21 at 1117am- fax confirmation received - original fax placed in Dr Toy Baker folder for Robin Harrell.  ?

## 2021-07-05 DIAGNOSIS — R278 Other lack of coordination: Secondary | ICD-10-CM | POA: Diagnosis not present

## 2021-07-05 DIAGNOSIS — R2681 Unsteadiness on feet: Secondary | ICD-10-CM | POA: Diagnosis not present

## 2021-07-05 DIAGNOSIS — M810 Age-related osteoporosis without current pathological fracture: Secondary | ICD-10-CM | POA: Diagnosis not present

## 2021-07-05 DIAGNOSIS — M6281 Muscle weakness (generalized): Secondary | ICD-10-CM | POA: Diagnosis not present

## 2021-07-05 DIAGNOSIS — Z9181 History of falling: Secondary | ICD-10-CM | POA: Diagnosis not present

## 2021-07-06 ENCOUNTER — Other Ambulatory Visit: Payer: Self-pay

## 2021-07-06 ENCOUNTER — Ambulatory Visit (INDEPENDENT_AMBULATORY_CARE_PROVIDER_SITE_OTHER): Payer: Medicare Other | Admitting: *Deleted

## 2021-07-06 ENCOUNTER — Telehealth: Payer: Self-pay | Admitting: Family Medicine

## 2021-07-06 DIAGNOSIS — S060X0D Concussion without loss of consciousness, subsequent encounter: Secondary | ICD-10-CM

## 2021-07-06 DIAGNOSIS — M4013 Other secondary kyphosis, cervicothoracic region: Secondary | ICD-10-CM

## 2021-07-06 DIAGNOSIS — S0003XD Contusion of scalp, subsequent encounter: Secondary | ICD-10-CM

## 2021-07-06 DIAGNOSIS — I10 Essential (primary) hypertension: Secondary | ICD-10-CM

## 2021-07-06 DIAGNOSIS — W19XXXD Unspecified fall, subsequent encounter: Secondary | ICD-10-CM

## 2021-07-06 MED ORDER — BRIMONIDINE TARTRATE 0.2 % OP SOLN: 1.0000 [drp] | Freq: Two times a day (BID) | OPHTHALMIC | 1 refills | Status: DC

## 2021-07-06 NOTE — Telephone Encounter (Signed)
Rx sent 

## 2021-07-06 NOTE — Telephone Encounter (Signed)
.. ?  Encourage patient to contact the pharmacy for refills or they can request refills through [facility] ? ?LAST APPOINTMENT DATE:  06/23/21 ? ?NEXT APPOINTMENT DATE: 11/17/21 ? ?MEDICATION:brimonidine (ALPHAGAN) 0.2 % ophthalmic solution ? ?Is the patient out of medication?  ? ?PHARMACY: ?[id number] [place] [address]:  [phone] ?Fax:  [phone] ?  ? ? ?Let patient know to contact pharmacy at the end of the day to make sure medication is ready. ? ?Please notify patient to allow 48-72 hours to process  ?

## 2021-07-06 NOTE — Chronic Care Management (AMB) (Signed)
?Chronic Care Management  ? ?CCM RN Visit Note ? ?07/06/2021 ?Name: Robin Harrell MRN: [id number] DOB: 1934-02-05 ? ?Subjective: ?Robin Harrell is a 88 y.o. year old female who is a primary care patient of [name], MD. The care management team was consulted for assistance with disease management and care coordination needs.   ? ?Engaged with patient by telephone for follow up visit in response to provider referral for case management and/or care coordination services.  ? ?Consent to Services:  ?The patient was given information about Chronic Care Management services, agreed to services, and gave verbal consent prior to initiation of services.  Please see initial visit note for detailed documentation.  ? ?Patient agreed to services and verbal consent obtained.  ? ?Assessment: Review of patient past medical history, allergies, medications, health status, including review of consultants reports, laboratory and other test data, was performed as part of comprehensive evaluation and provision of chronic care management services.  ? ?SDOH (Social Determinants of Health) assessments and interventions performed:   ? ?CCM Care Plan ? ?Allergies  ?Allergen Reactions  ? Azithromycin Nausea Only  ? Codeine Nausea Only  ? Erythromycin Nausea And Vomiting  ? ? ?Outpatient Encounter Medications as of 07/06/2021  ?Medication Sig  ? acetaminophen (TYLENOL) 500 MG tablet Take 1,000 mg by mouth every 6 (six) hours as needed for moderate pain.  ? atorvastatin (LIPITOR) 10 MG tablet TAKE 1 TABLET (10 MG TOTAL) BY MOUTH DAILY AT 6 PM.  ? azelastine (ASTELIN) 0.1 % nasal spray Place 2 sprays into both nostrils 2 (two) times daily. Use in each nostril as directed  ? brimonidine (ALPHAGAN) 0.2 % ophthalmic solution Place 1 drop into both eyes 2 (two) times daily.  ? Calcium-Phosphorus-Vitamin D (CALCIUM/D3 ADULT GUMMIES) 200-96.6-200 MG-MG-UNIT CHEW Chew 2 tablets by mouth daily.  ? fluticasone (FLONASE) 50 MCG/ACT nasal spray  SPRAY 2 SPRAYS INTO EACH NOSTRIL EVERY DAY (Patient taking differently: Place 2 sprays into both nostrils daily.)  ? ipratropium (ATROVENT) 0.03 % nasal spray Place 2 sprays into the nose 3 (three) times daily as needed.  ? latanoprost (XALATAN) 0.005 % ophthalmic solution Place 1 drop into both eyes at bedtime.   ? levocetirizine (XYZAL) 5 MG tablet TAKE 1 TABLET BY MOUTH EVERY DAY IN THE EVENING  ? levothyroxine (SYNTHROID) 25 MCG tablet Take 1 tablet (25 mcg total) by mouth daily before breakfast.  ? losartan (COZAAR) 50 MG tablet TAKE 1 TABLET BY MOUTH EVERY DAY (Patient taking differently: Take 50 mg by mouth daily.)  ? metoprolol tartrate (LOPRESSOR) 25 MG tablet Take 1 tablet (25 mg total) by mouth 2 (two) times daily.  ? multivitamin-iron-minerals-folic acid (CENTRUM) chewable tablet Chew 1 tablet by mouth daily.  ? vitamin E 1000 UNIT capsule Take 1,000 Units by mouth daily.  ? warfarin (COUMADIN) 7.5 MG tablet TAKE 1/2 TO 1 TABLET BY MOUTH DAILY AS DIRECTED (Patient taking differently: Take 7.5 mg by mouth daily. 1/2 tab M,W, F and 1 tablet on T,Th,S,Su)  ? [DISCONTINUED] brimonidine (ALPHAGAN) 0.2 % ophthalmic solution Place 1 drop into both eyes 2 (two) times daily.  ? ?No facility-administered encounter medications on file as of 07/06/2021.  ? ? ?Patient Active Problem List  ? Diagnosis Date Noted  ? Traumatic coccydynia 11/04/2020  ? Primary osteoarthritis of both knees 11/18/2019  ? 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 Chronic venous stasis dermatitis of both lower extremities 12/31/2018  ? Aortic stenosis, mild 02/07/2017  ? History of recurrent  UTIs 01/26/2017  ? Chronic idiopathic thrombocytopenia ([place]) 04/14/2016  ? Essential tremor 09/03/2015  ? History of respiratory failure 11/11/2014  ? Hypothyroidism (acquired) 09/02/2014  ? Edema of right lower extremity 02/01/2013  ? Mixed hyperlipidemia 11/19/2012  ? Colon polyp 10/15/2012  ? Long term (current) use of anticoagulants 05/10/2012  ?  Insomnia 10/11/2011  ? Osteoarthritis, multiple sites 06/30/2011  ? Diastolic dysfunction 32/44/0102  ? Factor V deficiency ([place]) 02/09/2010  ? Osteoporosis 02/09/2010  ? DJD (degenerative joint disease), lumbar 10/14/2009  ? Chronic rhinitis 09/04/2008  ? Glaucoma 01/29/2008  ? Essential hypertension 11/15/2007  ? History of DVT (deep vein thrombosis) 11/15/2007  ? Seasonal and perennial allergic rhinitis 11/15/2007  ? GERD 11/15/2007  ? Upper airway cough syndrome 11/15/2007  ? Chronic cough 11/15/2007  ? ? ?Conditions to be addressed/monitored:CHF and COPD ? ?Care Plan : RNCM COUGH/Heart Failure (Adult)  ?Updates made by Leona Singleton, RN since 07/06/2021 12:00 AM  ?  ? ?Problem: Symptom Exacerbation (Heart Failure/Cough)   ?Priority: Medium  ?  ? ?Long-Range Goal: Symptom Exacerbation Prevented or Minimized   ?Start Date: 03/18/2021  ?Expected End Date: 03/18/2022  ?Recent Progress: On track  ?Priority: Medium  ?Note:   ?Current Barriers:  ?Knowledge deficit related to basic heart failure pathophysiology and self care management of cough/allergies;  patient with history of diastolic dysfunction and chronic lower extremity edema and allergies with chronic cough.  States she does weigh herself daily.   ?Lacks reliable transportation; patient and spouse are no longer able to drive and are dependent upon friends for transportation to medical appointments; no family in town; Care guide referral placed for transportation resources ?3/9--patient reporting frozen food/container fell on here right leg just abone the ankle area a week ago.  Developed large egg sized hematoma.  When hematoma did not go down, patient and caregiver decided tostick needle in hematoma to drain by sticking it with a needle yesterday; did not drain completely but bleeding.  Instructed to not bother wound any longer and to call pcp.  Rncm assisted patient in contacting pcp for appointment asap.  WEIGHT IMCREASED TO 116 POUNDS.  Reports  prepackaged meds to start in April.   ?3/23--leg wound has been evaluated by provider.  Patient has completed round of antibiotics.  Reports wound is still open and draining.  Tries to keep wound clean and dry.  Encouraged patient to contact wound center as soon as possible.  Continues with cough and allergies, has seen aLLERGY sPECIALIST who encouraged to continue with current regime ?4/20--Reports still going to wound center for leg wound dressing changes, less painful.  States cough is much better.  Denies any chest pain or increase in SOB.  Does report some lower extremity edema.  Weight is 112.4 pounds. ?5/16--Riding in car with son in law, heading home; does repot recent fall with concussion and abrasion to back.  States she fell out of chair putting on her shoes and pulled step ladder down on her.  Reports she and husband just moved to [place] this past Saturday and are still adjusting.  Has hospital bed and has started home therapy.  Using walker for ambulation and reports dining hall is close to apartment.  Reports leg wound is healed ?Case Manager Clinical Goal(s):  ?patient will weigh self daily and record ?patient will verbalize understanding of Heart Failure Action Plan and when to call doctor ?patient will weigh daily and record (notifying MD of 3 lb weight gain over  night or 5 lb in a week) ? ?COUGH/COPD: (Status: Goal on Track (progressing): YES.) Long Term Goal  ?Reviewed medications with patient, including use of prescribed maintenance and rescue inhalers, and provided instruction on medication management and the importance of adherence ?Advised patient to track and manage COPD triggers ?Provided written and verbal instructions on pursed lip breathing and utilized returned demonstration as teach back ?Advised patient to self assesses COPD action plan zone and make appointment with provider if in the yellow zone for 48 hours without improvement ?Discussed the importance of  adequate rest and management of fatigue with COPD  ?Discussed limit outside activities ? ?Heart Failure Interventions:  (Status: Goal on Track (progressing): YES.)  Long Term Goal  ?Wt Readings from Last 3 Encounte

## 2021-07-06 NOTE — Patient Instructions (Addendum)
Visit Information ? ?Thank you for taking time to visit with me today. Please don't hesitate to contact me if I can be of assistance to you before our next scheduled telephone appointment. ? ?Following are the goals we discussed today:  ?Call office if I gain more than 2 pounds in one day or 5 pounds in one week ?Keep legs up while sitting ?Use salt in moderation ?Watch for swelling in feet, ankles and legs every day ?Weigh myself daily and record in log, taking log for provider review ?Keep wound clean and dry ?elevate of lower extremities ?Work with home health therapy ? ?Our next appointment is by telephone on 5/23 at 1200 ? ?Please call the care guide team at [phone] if you need to cancel or reschedule your appointment.  ? ?If you are experiencing a Mental Health or [place] or need someone to talk to, please call the Suicide and Crisis Lifeline: 988 ?call the [place] National Suicide Prevention Lifeline: (225) 173-6174 or TTY: (254) 068-7640 TTY 510-853-3120) to talk to a trained counselor ?call 1-800-273-TALK (toll free, 24 hour hotline) ?call 911  ? ?Patient verbalizes understanding of instructions and care plan provided today and agrees to view in [place]. Active MyChart status confirmed with patient.   ? ?[name] RN, MSN ?RN Care Management Coordinator  ?[place] ?[phone] ?[email or web address] ? ?

## 2021-07-07 DIAGNOSIS — R278 Other lack of coordination: Secondary | ICD-10-CM | POA: Diagnosis not present

## 2021-07-07 DIAGNOSIS — Z9181 History of falling: Secondary | ICD-10-CM | POA: Diagnosis not present

## 2021-07-07 DIAGNOSIS — R2681 Unsteadiness on feet: Secondary | ICD-10-CM | POA: Diagnosis not present

## 2021-07-07 DIAGNOSIS — M6281 Muscle weakness (generalized): Secondary | ICD-10-CM | POA: Diagnosis not present

## 2021-07-07 DIAGNOSIS — M810 Age-related osteoporosis without current pathological fracture: Secondary | ICD-10-CM | POA: Diagnosis not present

## 2021-07-09 DIAGNOSIS — M810 Age-related osteoporosis without current pathological fracture: Secondary | ICD-10-CM | POA: Diagnosis not present

## 2021-07-09 DIAGNOSIS — R278 Other lack of coordination: Secondary | ICD-10-CM | POA: Diagnosis not present

## 2021-07-09 DIAGNOSIS — R2681 Unsteadiness on feet: Secondary | ICD-10-CM | POA: Diagnosis not present

## 2021-07-09 DIAGNOSIS — M6281 Muscle weakness (generalized): Secondary | ICD-10-CM | POA: Diagnosis not present

## 2021-07-09 DIAGNOSIS — Z9181 History of falling: Secondary | ICD-10-CM | POA: Diagnosis not present

## 2021-07-12 ENCOUNTER — Telehealth: Payer: Self-pay | Admitting: Family Medicine

## 2021-07-12 DIAGNOSIS — R2681 Unsteadiness on feet: Secondary | ICD-10-CM | POA: Diagnosis not present

## 2021-07-12 DIAGNOSIS — M810 Age-related osteoporosis without current pathological fracture: Secondary | ICD-10-CM | POA: Diagnosis not present

## 2021-07-12 DIAGNOSIS — R278 Other lack of coordination: Secondary | ICD-10-CM | POA: Diagnosis not present

## 2021-07-12 DIAGNOSIS — Z9181 History of falling: Secondary | ICD-10-CM | POA: Diagnosis not present

## 2021-07-12 DIAGNOSIS — M6281 Muscle weakness (generalized): Secondary | ICD-10-CM | POA: Diagnosis not present

## 2021-07-12 NOTE — Telephone Encounter (Signed)
Pt daughter called with pt - per OT therapist patients right leg is weeping- Patient is at a home facility and is unable to come in today - patient asking she can receive a call with recommendations on what to do- please call [phone]- pt unable to come in per daughter. -

## 2021-07-12 NOTE — Telephone Encounter (Signed)
LVM for Gregary Signs to cb to the office or send me a message thru [facility] regarding the questions Jonni Sanger is wanting to know.

## 2021-07-13 ENCOUNTER — Ambulatory Visit: Payer: Medicare Other | Admitting: *Deleted

## 2021-07-13 DIAGNOSIS — I5189 Other ill-defined heart diseases: Secondary | ICD-10-CM

## 2021-07-13 DIAGNOSIS — S81801D Unspecified open wound, right lower leg, subsequent encounter: Secondary | ICD-10-CM

## 2021-07-13 DIAGNOSIS — I10 Essential (primary) hypertension: Secondary | ICD-10-CM

## 2021-07-13 DIAGNOSIS — W19XXXD Unspecified fall, subsequent encounter: Secondary | ICD-10-CM

## 2021-07-13 NOTE — Chronic Care Management (AMB) (Signed)
Chronic Care Management   CCM RN Visit Note  07/13/2021 Name: Robin Harrell MRN: [id number] DOB: [birth year]  Subjective: Robin Harrell is a 88 y.o. year old female who is a primary care patient of [name], MD. The care management team was consulted for assistance with disease management and care coordination needs.    Engaged with patient by telephone for follow up visit in response to provider referral for case management and/or care coordination services.   Consent to Services:  The patient was given information about Chronic Care Management services, agreed to services, and gave verbal consent prior to initiation of services.  Please see initial visit note for detailed documentation.   Patient agreed to services and verbal consent obtained.   Assessment: Review of patient past medical history, allergies, medications, health status, including review of consultants reports, laboratory and other test data, was performed as part of comprehensive evaluation and provision of chronic care management services.   SDOH (Social Determinants of Health) assessments and interventions performed:    CCM Care Plan  Allergies  Allergen Reactions   Azithromycin Nausea Only   Codeine Nausea Only   Erythromycin Nausea And Vomiting    Outpatient Encounter Medications as of 07/13/2021  Medication Sig   acetaminophen (TYLENOL) 500 MG tablet Take 1,000 mg by mouth every 6 (six) hours as needed for moderate pain.   atorvastatin (LIPITOR) 10 MG tablet TAKE 1 TABLET (10 MG TOTAL) BY MOUTH DAILY AT 6 PM.   azelastine (ASTELIN) 0.1 % nasal spray Place 2 sprays into both nostrils 2 (two) times daily. Use in each nostril as directed   brimonidine (ALPHAGAN) 0.2 % ophthalmic solution Place 1 drop into both eyes 2 (two) times daily.   Calcium-Phosphorus-Vitamin D (CALCIUM/D3 ADULT GUMMIES) 200-96.6-200 MG-MG-UNIT CHEW Chew 2 tablets by mouth daily.   fluticasone (FLONASE) 50 MCG/ACT nasal spray  SPRAY 2 SPRAYS INTO EACH NOSTRIL EVERY DAY (Patient taking differently: Place 2 sprays into both nostrils daily.)   ipratropium (ATROVENT) 0.03 % nasal spray Place 2 sprays into the nose 3 (three) times daily as needed.   latanoprost (XALATAN) 0.005 % ophthalmic solution Place 1 drop into both eyes at bedtime.    levocetirizine (XYZAL) 5 MG tablet TAKE 1 TABLET BY MOUTH EVERY DAY IN THE EVENING   levothyroxine (SYNTHROID) 25 MCG tablet Take 1 tablet (25 mcg total) by mouth daily before breakfast.   losartan (COZAAR) 50 MG tablet TAKE 1 TABLET BY MOUTH EVERY DAY (Patient taking differently: Take 50 mg by mouth daily.)   metoprolol tartrate (LOPRESSOR) 25 MG tablet Take 1 tablet (25 mg total) by mouth 2 (two) times daily.   multivitamin-iron-minerals-folic acid (CENTRUM) chewable tablet Chew 1 tablet by mouth daily.   vitamin E 1000 UNIT capsule Take 1,000 Units by mouth daily.   warfarin (COUMADIN) 7.5 MG tablet TAKE 1/2 TO 1 TABLET BY MOUTH DAILY AS DIRECTED (Patient taking differently: Take 7.5 mg by mouth daily. 1/2 tab M,W, F and 1 tablet on T,Th,S,Su)   No facility-administered encounter medications on file as of 07/13/2021.    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 01/29/2019   Chronic venous stasis dermatitis of both lower extremities 12/31/2018   Aortic stenosis, mild 02/07/2017   History of recurrent UTIs 01/26/2017   Chronic idiopathic thrombocytopenia ([place]) 04/14/2016   Essential tremor 09/03/2015   History of respiratory  failure 11/11/2014   Hypothyroidism (acquired) 09/02/2014   Edema of right lower extremity 02/01/2013   Mixed hyperlipidemia 11/19/2012   Colon polyp 10/15/2012   Long term (current) use of anticoagulants 05/10/2012   Insomnia 10/11/2011   Osteoarthritis, multiple sites 29/93/7169   Diastolic dysfunction 67/89/3810   Factor V  deficiency ([place]) 02/09/2010   Osteoporosis 02/09/2010   DJD (degenerative joint disease), lumbar 10/14/2009   Chronic rhinitis 09/04/2008   Glaucoma 01/29/2008   Essential hypertension 11/15/2007   History of DVT (deep vein thrombosis) 11/15/2007   Seasonal and perennial allergic rhinitis 11/15/2007   GERD 11/15/2007   Upper airway cough syndrome 11/15/2007   Chronic cough 11/15/2007    Conditions to be addressed/monitored:CHF and COPD  Care Plan : RNCM COUGH/Heart Failure (Adult)  Updates made by Leona Singleton, RN since 07/13/2021 12:00 AM     Problem: Symptom Exacerbation (Heart Failure/Cough)   Priority: Medium     Long-Range Goal: Symptom Exacerbation Prevented or Minimized   Start Date: 03/18/2021  Expected End Date: 03/18/2022  Recent Progress: On track  Priority: Medium  Note:   Current Barriers:  Knowledge deficit related to basic heart failure pathophysiology and self care management of cough/allergies;  patient with history of diastolic dysfunction and chronic lower extremity edema and allergies with chronic cough.  States she does weigh herself daily.   Lacks reliable transportation; patient and spouse are no longer able to drive and are dependent upon friends for transportation to medical appointments; no family in town; Care guide referral placed for transportation resources 3/9--patient reporting frozen food/container fell on here right leg just abone the ankle area a week ago.  Developed large egg sized hematoma.  When hematoma did not go down, patient and caregiver decided tostick needle in hematoma to drain by sticking it with a needle yesterday; did not drain completely but bleeding.  Instructed to not bother wound any longer and to call pcp.  Rncm assisted patient in contacting pcp for appointment asap.  WEIGHT IMCREASED TO 116 POUNDS.  Reports prepackaged meds to start in April.   3/23--leg wound has been evaluated by provider.  Patient has completed round of  antibiotics.  Reports wound is still open and draining.  Tries to keep wound clean and dry.  Encouraged patient to contact wound center as soon as possible.  Continues with cough and allergies, has seen aLLERGY sPECIALIST who encouraged to continue with current regime 4/20--Reports still going to wound center for leg wound dressing changes, less painful.  States cough is much better.  Denies any chest pain or increase in SOB.  Does report some lower extremity edema.  Weight is 112.4 pounds. 5/16--Riding in car with son in law, heading home; does repot recent fall with concussion and abrasion to back.  States she fell out of chair putting on her shoes and pulled step ladder down on her.  Reports she and husband just moved to [place] this past Saturday and are still adjusting.  Has hospital bed and has started home therapy.  Using walker for ambulation and reports dining hall is close to apartment.  Reports leg wound is healed 5/23--REPORTS left leg wound was weeping yesterday; notified PCP and was requested to schedule office visit.  RNCM assisted patient call office to arrange appointment for 5/24 at 1530.  Patient and daughters updated.  Reports wound is not weeping today, but has been wearing support hose.  Having hard time adjusting to ALF.  Does report lower extremities  are swollen Case Manager Clinical Goal(s):  patient will weigh self daily and record patient will verbalize understanding of Heart Failure Action Plan and when to call doctor patient will weigh daily and record (notifying MD of 3 lb weight gain over night or 5 lb in a week)  COUGH/COPD: (Status: Goal on Track (progressing): YES.) Long Term Goal  Reviewed medications with patient, including use of prescribed maintenance and rescue inhalers, and provided instruction on medication management and the importance of adherence Advised patient to track and manage COPD triggers Provided written and verbal instructions  on pursed lip breathing and utilized returned demonstration as teach back Advised patient to self assesses COPD action plan zone and make appointment with provider if in the yellow zone for 48 hours without improvement Discussed the importance of adequate rest and management of fatigue with COPD  Discussed limit outside activities  Heart Failure Interventions:  (Status: Goal on track: NO.)  Long Term Goal  Wt Readings from Last 3 Encounters:  06/23/21 112 lb 3.2 oz (50.9 kg)  05/10/21 123 lb 3.2 oz (55.9 kg)  05/04/21 119 lb 3.2 oz (54.1 kg)  Basic overview and discussion of pathophysiology of Heart Failure reviewed Provided education on low sodium diet Reviewed Heart Failure Action Plan in depth and provided written copy Discussed importance of daily weight and advised patient to weigh and record daily Reviewed role of diuretics in prevention of fluid overload and management of heart failure Discussed the importance of keeping all appointments with provider  Collaboration with [name], MD regarding development and update of comprehensive plan of care as evidenced by provider attestation and co-signature Inter-disciplinary care team collaboration (see longitudinal plan of care) Discussed when to call provider based on weight. Barriers to lifestyle changes reviewed and addressed and barriers to treatment reviewed and addressed Healthy lifestyle promoted Self-awareness of signs/symptoms of worsening disease encouraged Encouraged to attend all scheduled provider appointments Encouraged to call provider office for new concerns or questions Encouraged elevation of lower extremities Fall precautions and preventions reviewed and discussed Encouraged to use walker with ambulation and to work with home therapy Patient Goals/Self-Care Activities: Call office if I gain more than 2 pounds in one day or 5 pounds in one week Keep legs up while sitting Use salt in moderation Watch for  swelling in feet, ankles and legs every day Weigh myself daily and record in log, taking log for provider review Keep wound clean and dry elevate of lower extremities Work with home health therapy Follow Up Plan: The care management team will reach out to the patient again over the next 30 business days.      Plan:The care management team will reach out to the patient again over the next 30 days.  [name] RN, MSN RN Care Management Coordinator  [place] [phone] [email or web address]

## 2021-07-13 NOTE — Patient Instructions (Addendum)
Visit Information  Thank you for taking time to visit with me today. Please don't hesitate to contact me if I can be of assistance to you before our next scheduled telephone appointment.  Following are the goals we discussed today:  Call office if I gain more than 2 pounds in one day or 5 pounds in one week Keep legs up while sitting Use salt in moderation Watch for swelling in feet, ankles and legs every day Weigh myself daily and record in log, taking log for provider review Keep wound clean and dry elevate of lower extremities Work with home health therapy  Our next appointment is by telephone on 5/30 at 1130  Please call the care guide team at [phone] if you need to cancel or reschedule your appointment.   If you are experiencing a Mental Health or [place] or need someone to talk to, please call the Suicide and Crisis Lifeline: 988 call the [place] National Suicide Prevention Lifeline: 413-182-4324 or TTY: (850)051-9314 TTY (410)252-2841) to talk to a trained counselor call 1-800-273-TALK (toll free, 24 hour hotline) go to [facility] [address] [place] [phone]) call 911   Patient verbalizes understanding of instructions and care plan provided today and agrees to view in [place]. Active MyChart status and patient understanding of how to access instructions and care plan via MyChart confirmed with patient.     [name] RN, MSN RN Care Management Coordinator  [place] [phone] [email or web address]

## 2021-07-14 ENCOUNTER — Ambulatory Visit (INDEPENDENT_AMBULATORY_CARE_PROVIDER_SITE_OTHER): Payer: Medicare Other | Admitting: Family Medicine

## 2021-07-14 VITALS — BP 100/46 | HR 58 | Temp 98.5°F | Ht 64.0 in | Wt 115.8 lb

## 2021-07-14 DIAGNOSIS — R278 Other lack of coordination: Secondary | ICD-10-CM | POA: Diagnosis not present

## 2021-07-14 DIAGNOSIS — M810 Age-related osteoporosis without current pathological fracture: Secondary | ICD-10-CM | POA: Diagnosis not present

## 2021-07-14 DIAGNOSIS — R600 Localized edema: Secondary | ICD-10-CM

## 2021-07-14 DIAGNOSIS — R2681 Unsteadiness on feet: Secondary | ICD-10-CM | POA: Diagnosis not present

## 2021-07-14 DIAGNOSIS — Z9181 History of falling: Secondary | ICD-10-CM | POA: Diagnosis not present

## 2021-07-14 DIAGNOSIS — I5189 Other ill-defined heart diseases: Secondary | ICD-10-CM | POA: Diagnosis not present

## 2021-07-14 DIAGNOSIS — M6281 Muscle weakness (generalized): Secondary | ICD-10-CM | POA: Diagnosis not present

## 2021-07-14 DIAGNOSIS — I10 Essential (primary) hypertension: Secondary | ICD-10-CM

## 2021-07-14 MED ORDER — FUROSEMIDE 20 MG PO TABS: 20.0000 mg | ORAL_TABLET | Freq: Every day | ORAL | 3 refills | Status: DC | PRN

## 2021-07-14 MED ORDER — LOSARTAN POTASSIUM 50 MG PO TABS: 25.0000 mg | ORAL_TABLET | Freq: Every day | ORAL | 3 refills | Status: DC

## 2021-07-14 NOTE — Progress Notes (Addendum)
Subjective  CC:  Chief Complaint  Patient presents with   weeping wound    Pt stated that she had some fluids coming out her Rt leg    HPI: Robin Harrell is a 88 y.o. female who presents to the office today to address the problems listed above in the chief complaint. Patient presents due to worsening right lower extremity edema with some weeping over the last several nights.  She is recently moved to assisted living.  Less active.  Continues to wear compression stockings.  No new wounds.  No fevers or chills.  No chest pain, shortness of breath, orthopnea.  She has chronic leg edema worse on right that started after her total knee replacement.  She does wear compression hose.  No redness or significant pain.  Possibly related to change in diet as well. Diastolic dysfunction without systolic dysfunction or signs of left heart failure Hypertension: Has been well controlled on losartan and beta-blocker.  However running a little low today.  She has no symptoms of low blood pressure, no lightheadedness.  No palpitations. Need for semi electric hospital bed with gel overlay: Pt has limited mobility and limited ability to self position due to severe kyphosis; she needs assistance with repositioning in bed; she needs frequent body position changes to prevent bed sores and to help with leg elevation due to leg edema (see above). Want to prevent lower extremity wounds. The gel overlay will help prevent pressure sores. Pt's mobility is limited due to OA and kyphosis and lumbar djd.   Assessment  1. Edema of right lower extremity   2. Diastolic dysfunction   3. Essential hypertension      Plan  Chronic edema, progressive with weeping: No signs of inflammation or infection at this time.  We will start 20 mg Lasix as needed.  Elevation of leg continue compression hose.  Monitor closely.  Her caregiver is with her.  She will keep an eye on it as well.  She will follow-up in the office if things are  not improving. Diastolic dysfunction: Stable continue beta-blocker Essential hypertension with low blood pressure today.  Decrease losartan to 25 mg daily.  Continue to monitor at home. Hospital bed ordered. See above.  Follow up: As scheduled 07/20/2021  No orders of the defined types were placed in this encounter.  Meds ordered this encounter  Medications   losartan (COZAAR) 50 MG tablet    Sig: Take 0.5 tablets (25 mg total) by mouth daily.    Dispense:  90 tablet    Refill:  3   furosemide (LASIX) 20 MG tablet    Sig: Take 1 tablet (20 mg total) by mouth daily as needed for edema.    Dispense:  30 tablet    Refill:  3      I reviewed the patients updated PMH, FH, and SocHx.    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    Priority: High   Osteoporosis 02/09/2010    Priority: High   Essential hypertension 11/15/2007    Priority: High   Chronic venous stasis dermatitis of both lower extremities 12/31/2018    Priority: Medium    Aortic  stenosis, mild 02/07/2017    Priority: Medium    Chronic idiopathic thrombocytopenia ([place]) 04/14/2016    Priority: Medium    Essential tremor 09/03/2015    Priority: Medium    Edema of right lower extremity 02/01/2013    Priority: Medium    Colon polyp 10/15/2012    Priority: Medium    Insomnia 10/11/2011    Priority: Medium    Osteoarthritis, multiple sites 06/30/2011    Priority: Medium    DJD (degenerative joint disease), lumbar 10/14/2009    Priority: Medium    Glaucoma 01/29/2008    Priority: Medium    History of DVT (deep vein thrombosis) 11/15/2007    Priority: Medium    GERD 11/15/2007    Priority: Medium    Chronic cough 11/15/2007     Priority: Medium    Primary osteoarthritis of both knees 11/18/2019    Priority: Low   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 01/29/2019    Priority: Low   History of recurrent UTIs 01/26/2017    Priority: Low   History of respiratory failure 11/11/2014    Priority: Low   Seasonal and perennial allergic rhinitis 11/15/2007    Priority: Low   Traumatic coccydynia 11/04/2020   Chronic rhinitis 09/04/2008   Upper airway cough syndrome 11/15/2007   Current Meds  Medication Sig   acetaminophen (TYLENOL) 500 MG tablet Take 1,000 mg by mouth every 6 (six) hours as needed for moderate pain.   atorvastatin (LIPITOR) 10 MG tablet TAKE 1 TABLET (10 MG TOTAL) BY MOUTH DAILY AT 6 PM.   azelastine (ASTELIN) 0.1 % nasal spray Place 2 sprays into both nostrils 2 (two) times daily. Use in each nostril as directed   brimonidine (ALPHAGAN) 0.2 % ophthalmic solution Place 1 drop into both eyes 2 (two) times daily.   Calcium-Phosphorus-Vitamin D (CALCIUM/D3 ADULT GUMMIES) 200-96.6-200 MG-MG-UNIT CHEW Chew 2 tablets by mouth daily.   fluticasone (FLONASE) 50 MCG/ACT nasal spray SPRAY 2 SPRAYS INTO EACH NOSTRIL EVERY DAY (Patient taking differently: Place 2 sprays into both nostrils daily.)   furosemide (LASIX) 20 MG tablet Take 1 tablet (20 mg total) by mouth daily as needed for edema.   ipratropium (ATROVENT) 0.03 % nasal spray Place 2 sprays into the nose 3 (three) times daily as needed.   latanoprost (XALATAN) 0.005 % ophthalmic solution Place 1 drop into both eyes at bedtime.    levocetirizine (XYZAL) 5 MG tablet TAKE 1 TABLET BY MOUTH EVERY DAY IN THE EVENING   levothyroxine (SYNTHROID) 25 MCG tablet Take 1 tablet (25 mcg total) by mouth daily before breakfast.   metoprolol tartrate (LOPRESSOR) 25 MG tablet Take 1 tablet (25 mg total) by mouth 2 (two) times daily.   multivitamin-iron-minerals-folic acid (CENTRUM) chewable tablet Chew 1 tablet by mouth daily.   vitamin E 1000 UNIT capsule Take 1,000 Units by mouth  daily.   warfarin (COUMADIN) 7.5 MG tablet TAKE 1/2 TO 1 TABLET BY MOUTH DAILY AS DIRECTED (Patient taking differently: Take 7.5 mg by mouth daily. 1/2 tab M,W, F and 1 tablet on T,Th,S,Su)   [DISCONTINUED] losartan (COZAAR) 50 MG tablet TAKE 1 TABLET BY MOUTH EVERY DAY (Patient taking differently: Take 50 mg by mouth daily.)    Allergies: Patient is allergic to azithromycin, codeine, and erythromycin. Family History: Patient family history includes Allergic rhinitis in her sister; Breast cancer (age of onset: 31) in her daughter; Cirrhosis in her father; Coronary artery disease in her maternal grandfather and paternal grandfather; Heart disease in her paternal  grandmother; Lung cancer in her father; Skin cancer in her brother; Throat cancer in her maternal grandfather and paternal grandfather. Social History:  Patient  reports that she quit smoking about 63 years ago. Her smoking use included cigarettes. She has never used smokeless tobacco. She reports current alcohol use. She reports that she does not use drugs.  Review of Systems: Constitutional: Negative for fever malaise or anorexia Cardiovascular: negative for chest pain Respiratory: negative for SOB or persistent cough Gastrointestinal: negative for abdominal pain  Objective  Vitals: BP (!) 100/46 Comment: Lt arm  Pulse (!) 58   Temp 98.5 F (36.9 C)   Ht '5\' 4"'$  (1.626 m)   Wt 115 lb 12.8 oz (52.5 kg)   LMP  (LMP Unknown)   SpO2 96%   BMI 19.88 kg/m  General: no acute distress , A&Ox3, kyphotic HEENT: PEERL, conjunctiva normal, neck is supple Cardiovascular: Regular rate rhythm 3/6 systolic murmur Respiratory:  Good breath sounds bilaterally, CTAB with normal respiratory effort Skin:  Warm, no rashes Bilateral lower extremity edema 3+ right, 2+ left, no erythema.  No bruising now.  No wounds.    Commons side effects, risks, benefits, and alternatives for medications and treatment plan prescribed today were discussed, and  the patient expressed understanding of the given instructions. Patient is instructed to call or message via MyChart if he/she has any questions or concerns regarding our treatment plan. No barriers to understanding were identified. We discussed Red Flag symptoms and signs in detail. Patient expressed understanding regarding what to do in case of urgent or emergency type symptoms.  Medication list was reconciled, printed and provided to the patient in AVS. Patient instructions and summary information was reviewed with the patient as documented in the AVS. This note was prepared with assistance of Dragon voice recognition software. Occasional wrong-word or sound-a-like substitutions may have occurred due to the inherent limitations of voice recognition software  This visit occurred during the SARS-CoV-2 public health emergency.  Safety protocols were in place, including screening questions prior to the visit, additional usage of staff PPE, and extensive cleaning of exam room while observing appropriate contact time as indicated for disinfecting solutions.

## 2021-07-14 NOTE — Patient Instructions (Signed)
Please follow up as scheduled for your next visit with me: 11/17/2021   Let me know if swelling does not improve.  Decrease the losartan to '25mg'$  (1/2 pill daily).  Elevate the legs.   If you have any questions or concerns, please don't hesitate to send me a message via MyChart or call the office at [phone]. Thank you for visiting with [place] today! It's our pleasure caring for you.

## 2021-07-15 ENCOUNTER — Encounter: Payer: Self-pay | Admitting: Family Medicine

## 2021-07-15 DIAGNOSIS — R2681 Unsteadiness on feet: Secondary | ICD-10-CM | POA: Diagnosis not present

## 2021-07-15 DIAGNOSIS — Z9181 History of falling: Secondary | ICD-10-CM | POA: Diagnosis not present

## 2021-07-15 DIAGNOSIS — M6281 Muscle weakness (generalized): Secondary | ICD-10-CM | POA: Diagnosis not present

## 2021-07-15 DIAGNOSIS — R278 Other lack of coordination: Secondary | ICD-10-CM | POA: Diagnosis not present

## 2021-07-15 DIAGNOSIS — M810 Age-related osteoporosis without current pathological fracture: Secondary | ICD-10-CM | POA: Diagnosis not present

## 2021-07-15 NOTE — Telephone Encounter (Signed)
Pt came in for an appt on 07/14/2021 and her questions/concerns were addressed.

## 2021-07-16 NOTE — Telephone Encounter (Signed)
pt sates she did not pick up furosemide (LASIX) 20 MG tablet [[id number]]  because she was certain she already had it and it was not the correct medication.  Pt sates she did not pick up levocetirizine (XYZAL) 5 MG tablet [[id number]] because it was not what she was expecting and it was too expensive. Pharmacy told her there is an OTC option, but was unsure what to get.   Pt is requesting a call back before 3:30 pm (when her health aide arrives) to know which of the medications she should fill and how to handle these things.

## 2021-07-20 ENCOUNTER — Other Ambulatory Visit: Payer: Self-pay

## 2021-07-20 ENCOUNTER — Ambulatory Visit: Payer: Medicare Other | Admitting: *Deleted

## 2021-07-20 DIAGNOSIS — R600 Localized edema: Secondary | ICD-10-CM

## 2021-07-20 DIAGNOSIS — R2681 Unsteadiness on feet: Secondary | ICD-10-CM | POA: Diagnosis not present

## 2021-07-20 DIAGNOSIS — Z8709 Personal history of other diseases of the respiratory system: Secondary | ICD-10-CM

## 2021-07-20 DIAGNOSIS — I5189 Other ill-defined heart diseases: Secondary | ICD-10-CM

## 2021-07-20 DIAGNOSIS — Z9181 History of falling: Secondary | ICD-10-CM | POA: Diagnosis not present

## 2021-07-20 DIAGNOSIS — I10 Essential (primary) hypertension: Secondary | ICD-10-CM

## 2021-07-20 DIAGNOSIS — M6281 Muscle weakness (generalized): Secondary | ICD-10-CM | POA: Diagnosis not present

## 2021-07-20 DIAGNOSIS — M810 Age-related osteoporosis without current pathological fracture: Secondary | ICD-10-CM | POA: Diagnosis not present

## 2021-07-20 DIAGNOSIS — R053 Chronic cough: Secondary | ICD-10-CM

## 2021-07-20 DIAGNOSIS — R278 Other lack of coordination: Secondary | ICD-10-CM | POA: Diagnosis not present

## 2021-07-20 NOTE — Telephone Encounter (Addendum)
I have LVM for pt with Andy's response on how to take medication.   I have spoke with the daughter regarding instructions with the lasix for swelling and cutting the losartan in half. Daughter understood directions.

## 2021-07-20 NOTE — Chronic Care Management (AMB) (Signed)
Chronic Care Management   CCM RN Visit Note  07/20/2021 Name: Robin Harrell MRN: [id number] DOB: 10/31/1933  Subjective: Robin Harrell is a 88 y.o. year old female who is a primary care patient of [name], MD. The care management team was consulted for assistance with disease management and care coordination needs.    Engaged with patient by telephone for follow up visit in response to provider referral for case management and/or care coordination services.   Consent to Services:  The patient was given information about Chronic Care Management services, agreed to services, and gave verbal consent prior to initiation of services.  Please see initial visit note for detailed documentation.   Patient agreed to services and verbal consent obtained.   Assessment: Review of patient past medical history, allergies, medications, health status, including review of consultants reports, laboratory and other test data, was performed as part of comprehensive evaluation and provision of chronic care management services.   SDOH (Social Determinants of Health) assessments and interventions performed:    CCM Care Plan  Allergies  Allergen Reactions   Azithromycin Nausea Only   Codeine Nausea Only   Erythromycin Nausea And Vomiting    Outpatient Encounter Medications as of 07/20/2021  Medication Sig   acetaminophen (TYLENOL) 500 MG tablet Take 1,000 mg by mouth every 6 (six) hours as needed for moderate pain.   atorvastatin (LIPITOR) 10 MG tablet TAKE 1 TABLET (10 MG TOTAL) BY MOUTH DAILY AT 6 PM.   azelastine (ASTELIN) 0.1 % nasal spray Place 2 sprays into both nostrils 2 (two) times daily. Use in each nostril as directed   brimonidine (ALPHAGAN) 0.2 % ophthalmic solution Place 1 drop into both eyes 2 (two) times daily.   Calcium-Phosphorus-Vitamin D (CALCIUM/D3 ADULT GUMMIES) 200-96.6-200 MG-MG-UNIT CHEW Chew 2 tablets by mouth daily.   fluticasone (FLONASE) 50 MCG/ACT nasal spray  SPRAY 2 SPRAYS INTO EACH NOSTRIL EVERY DAY (Patient taking differently: Place 2 sprays into both nostrils daily.)   furosemide (LASIX) 20 MG tablet Take 1 tablet (20 mg total) by mouth daily as needed for edema.   ipratropium (ATROVENT) 0.03 % nasal spray Place 2 sprays into the nose 3 (three) times daily as needed.   latanoprost (XALATAN) 0.005 % ophthalmic solution Place 1 drop into both eyes at bedtime.    levocetirizine (XYZAL) 5 MG tablet TAKE 1 TABLET BY MOUTH EVERY DAY IN THE EVENING   levothyroxine (SYNTHROID) 25 MCG tablet Take 1 tablet (25 mcg total) by mouth daily before breakfast.   losartan (COZAAR) 50 MG tablet Take 0.5 tablets (25 mg total) by mouth daily.   metoprolol tartrate (LOPRESSOR) 25 MG tablet Take 1 tablet (25 mg total) by mouth 2 (two) times daily.   multivitamin-iron-minerals-folic acid (CENTRUM) chewable tablet Chew 1 tablet by mouth daily.   vitamin E 1000 UNIT capsule Take 1,000 Units by mouth daily.   warfarin (COUMADIN) 7.5 MG tablet TAKE 1/2 TO 1 TABLET BY MOUTH DAILY AS DIRECTED (Patient taking differently: Take 7.5 mg by mouth daily. 1/2 tab M,W, F and 1 tablet on T,Th,S,Su)   No facility-administered encounter medications on file as of 07/20/2021.    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 01/29/2019   Chronic venous stasis dermatitis of both lower extremities 12/31/2018   Aortic stenosis, mild 02/07/2017   History of recurrent UTIs 01/26/2017   Chronic idiopathic  thrombocytopenia ([place]) 04/14/2016   Essential tremor 09/03/2015   History of respiratory failure 11/11/2014   Hypothyroidism (acquired) 09/02/2014   Edema of right lower extremity 02/01/2013   Mixed hyperlipidemia 11/19/2012   Colon polyp 10/15/2012   Long term (current) use of anticoagulants 05/10/2012   Insomnia 10/11/2011   Osteoarthritis, multiple sites  48/54/6270   Diastolic dysfunction 35/00/9381   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   Osteoporosis 02/09/2010   DJD (degenerative joint disease), lumbar 10/14/2009   Chronic rhinitis 09/04/2008   Glaucoma 01/29/2008   Essential hypertension 11/15/2007   History of DVT (deep vein thrombosis) 11/15/2007   Seasonal and perennial allergic rhinitis 11/15/2007   GERD 11/15/2007   Upper airway cough syndrome 11/15/2007   Chronic cough 11/15/2007    Conditions to be addressed/monitored:CHF  Care Plan : RNCM COUGH/Heart Failure (Adult)  Updates made by Leona Singleton, RN since 07/20/2021 12:00 AM     Problem: Symptom Exacerbation (Heart Failure/Cough)   Priority: Medium     Long-Range Goal: Symptom Exacerbation Prevented or Minimized   Start Date: 03/18/2021  Expected End Date: 03/18/2022  Recent Progress: On track  Priority: Medium  Note:   Current Barriers:  Knowledge deficit related to basic heart failure pathophysiology and self care management of cough/allergies;  patient with history of diastolic dysfunction and chronic lower extremity edema and allergies with chronic cough.  States she does weigh herself daily.   Lacks reliable transportation; patient and spouse are no longer able to drive and are dependent upon friends for transportation to medical appointments; no family in town; Care guide referral placed for transportation resources 3/9--patient reporting frozen food/container fell on here right leg just abone the ankle area a week ago.  Developed large egg sized hematoma.  When hematoma did not go down, patient and caregiver decided tostick needle in hematoma to drain by sticking it with a needle yesterday; did not drain completely but bleeding.  Instructed to not bother wound any longer and to call pcp.  Rncm assisted patient in contacting pcp for appointment asap.  WEIGHT IMCREASED TO 116 POUNDS.  Reports prepackaged meds to start in April.   3/23--leg wound has been  evaluated by provider.  Patient has completed round of antibiotics.  Reports wound is still open and draining.  Tries to keep wound clean and dry.  Encouraged patient to contact wound center as soon as possible.  Continues with cough and allergies, has seen aLLERGY sPECIALIST who encouraged to continue with current regime 4/20--Reports still going to wound center for leg wound dressing changes, less painful.  States cough is much better.  Denies any chest pain or increase in SOB.  Does report some lower extremity edema.  Weight is 112.4 pounds. 5/16--Riding in car with son in law, heading home; does repot recent fall with concussion and abrasion to back.  States she fell out of chair putting on her shoes and pulled step ladder down on her.  Reports she and husband just moved to [place] this past Saturday and are still adjusting.  Has hospital bed and has started home therapy.  Using walker for ambulation and reports dining hall is close to apartment.  Reports leg wound is healed 5/23--REPORTS left leg wound was weeping yesterday; notified PCP and was requested to schedule office visit.  RNCM assisted patient call office to arrange appointment for 5/24 at 1530.  Patient and daughters updated.  Reports wound is not weeping today, but has been wearing support hose.  Having hard time adjusting to ILF.  Does report lower extremities are swollen 5/30--continues to have difficult time adjusting to ILF; would like to get out of room to meet new people and do activities but has to stay with husband until aid is available to sit with him (children are working on).  Still with LE edema, has not taking lasix yet, discussed importance of lasix and encouraged to take with continued edema.  Weight up to 115.4 Case Manager Clinical Goal(s):  patient will weigh self daily and record patient will verbalize understanding of Heart Failure Action Plan and when to call doctor patient will weigh daily and  record (notifying MD of 3 lb weight gain over night or 5 lb in a week)  COUGH/COPD: (Status: Goal on Track (progressing): YES.) Long Term Goal  Reviewed medications with patient, including use of prescribed maintenance and rescue inhalers, and provided instruction on medication management and the importance of adherence Advised patient to track and manage COPD triggers Provided written and verbal instructions on pursed lip breathing and utilized returned demonstration as teach back Advised patient to self assesses COPD action plan zone and make appointment with provider if in the yellow zone for 48 hours without improvement Discussed the importance of adequate rest and management of fatigue with COPD  Discussed limit outside activities  Heart Failure Interventions:  (Status: Goal on track: NO.)  Long Term Goal  Wt Readings from Last 3 Encounters:  06/23/21 112 lb 3.2 oz (50.9 kg)  05/10/21 123 lb 3.2 oz (55.9 kg)  05/04/21 119 lb 3.2 oz (54.1 kg)  Basic overview and discussion of pathophysiology of Heart Failure reviewed Provided education on low sodium diet Reviewed Heart Failure Action Plan in depth and provided written copy Discussed importance of daily weight and advised patient to weigh and record daily Reviewed role of diuretics in prevention of fluid overload and management of heart failure Discussed the importance of keeping all appointments with provider  Collaboration with [name], MD regarding development and update of comprehensive plan of care as evidenced by provider attestation and co-signature Inter-disciplinary care team collaboration (see longitudinal plan of care) Discussed when to call provider based on weight. Barriers to lifestyle changes reviewed and addressed and barriers to treatment reviewed and addressed Healthy lifestyle promoted Self-awareness of signs/symptoms of worsening disease encouraged Encouraged to attend all scheduled provider  appointments Encouraged to call provider office for new concerns or questions Encouraged elevation of lower extremities Fall precautions and preventions reviewed and discussed Encouraged to use walker with ambulation and to work with home therapy Patient Goals/Self-Care Activities: Call office if I gain more than 2 pounds in one day or 5 pounds in one week Keep legs up while sitting Use salt in moderation Watch for swelling in feet, ankles and legs every day Weigh myself daily and record in log, taking log for provider review Keep wound clean and dry elevate of lower extremities Work with home health therapy Increase activity as tolerated Follow Up Plan: The care management team will reach out to the patient again over the next 30 business days.      Plan:The care management team will reach out to the patient again over the next 30 days.  [name] RN, MSN RN Care Management Coordinator  [place] [phone] [email or web address]

## 2021-07-20 NOTE — Patient Instructions (Addendum)
Visit Information  Thank you for taking time to visit with me today. Please don't hesitate to contact me if I can be of assistance to you before our next scheduled telephone appointment.  Following are the goals we discussed today:   Call office if I gain more than 2 pounds in one day or 5 pounds in one week  Keep legs up while sitting  Use salt in moderation  Watch for swelling in feet, ankles and legs every day  Weigh myself daily and record in log, taking log for provider review  Keep wound clean and dry  elevate of lower extremities  Work with home health therapy  Increase activity as tolerated  Our next appointment is by telephone on 6/20 at 1000  Please call the care guide team at [phone] if you need to cancel or reschedule your appointment.   If you are experiencing a Mental Health or [place] or need someone to talk to, please call the Suicide and Crisis Lifeline: 988 call the [place] National Suicide Prevention Lifeline: (805)272-3650 or TTY: 339-721-2716 TTY 209-835-8072) to talk to a trained counselor call 1-800-273-TALK (toll free, 24 hour hotline) call 911   Patient verbalizes understanding of instructions and care plan provided today and agrees to view in [place]. Active MyChart status and patient understanding of how to access instructions and care plan via MyChart confirmed with patient.     [name] RN, MSN RN Care Management Coordinator  [place] [phone] [email or web address]

## 2021-07-20 NOTE — Telephone Encounter (Signed)
Pt states she was never called back about her questions on 05/26.  Explained to pt that a message had been sent back via [place].   Pt states she "can't do mychart".  Pt will be leaving for an appointment before 11:00am. Please call before then.

## 2021-07-21 DIAGNOSIS — M6281 Muscle weakness (generalized): Secondary | ICD-10-CM | POA: Diagnosis not present

## 2021-07-21 DIAGNOSIS — M810 Age-related osteoporosis without current pathological fracture: Secondary | ICD-10-CM | POA: Diagnosis not present

## 2021-07-21 DIAGNOSIS — Z9181 History of falling: Secondary | ICD-10-CM | POA: Diagnosis not present

## 2021-07-21 DIAGNOSIS — R2681 Unsteadiness on feet: Secondary | ICD-10-CM | POA: Diagnosis not present

## 2021-07-21 DIAGNOSIS — R278 Other lack of coordination: Secondary | ICD-10-CM | POA: Diagnosis not present

## 2021-07-21 DIAGNOSIS — I10 Essential (primary) hypertension: Secondary | ICD-10-CM

## 2021-07-22 ENCOUNTER — Telehealth: Payer: Self-pay

## 2021-07-22 DIAGNOSIS — M810 Age-related osteoporosis without current pathological fracture: Secondary | ICD-10-CM | POA: Diagnosis not present

## 2021-07-22 DIAGNOSIS — M6281 Muscle weakness (generalized): Secondary | ICD-10-CM | POA: Diagnosis not present

## 2021-07-22 DIAGNOSIS — Z9181 History of falling: Secondary | ICD-10-CM | POA: Diagnosis not present

## 2021-07-22 DIAGNOSIS — R2681 Unsteadiness on feet: Secondary | ICD-10-CM | POA: Diagnosis not present

## 2021-07-22 DIAGNOSIS — R278 Other lack of coordination: Secondary | ICD-10-CM | POA: Diagnosis not present

## 2021-07-22 NOTE — Telephone Encounter (Signed)
I have faxed amended notes from 07/14/2021 to [facility].

## 2021-07-23 DIAGNOSIS — M810 Age-related osteoporosis without current pathological fracture: Secondary | ICD-10-CM | POA: Diagnosis not present

## 2021-07-23 DIAGNOSIS — R2681 Unsteadiness on feet: Secondary | ICD-10-CM | POA: Diagnosis not present

## 2021-07-23 DIAGNOSIS — Z9181 History of falling: Secondary | ICD-10-CM | POA: Diagnosis not present

## 2021-07-23 DIAGNOSIS — M6281 Muscle weakness (generalized): Secondary | ICD-10-CM | POA: Diagnosis not present

## 2021-07-23 DIAGNOSIS — R278 Other lack of coordination: Secondary | ICD-10-CM | POA: Diagnosis not present

## 2021-07-26 DIAGNOSIS — M6281 Muscle weakness (generalized): Secondary | ICD-10-CM | POA: Diagnosis not present

## 2021-07-26 DIAGNOSIS — R2681 Unsteadiness on feet: Secondary | ICD-10-CM | POA: Diagnosis not present

## 2021-07-26 DIAGNOSIS — H401132 Primary open-angle glaucoma, bilateral, moderate stage: Secondary | ICD-10-CM | POA: Diagnosis not present

## 2021-07-26 DIAGNOSIS — H524 Presbyopia: Secondary | ICD-10-CM | POA: Diagnosis not present

## 2021-07-26 DIAGNOSIS — R278 Other lack of coordination: Secondary | ICD-10-CM | POA: Diagnosis not present

## 2021-07-26 DIAGNOSIS — Z9181 History of falling: Secondary | ICD-10-CM | POA: Diagnosis not present

## 2021-07-26 DIAGNOSIS — M810 Age-related osteoporosis without current pathological fracture: Secondary | ICD-10-CM | POA: Diagnosis not present

## 2021-07-27 DIAGNOSIS — Z9181 History of falling: Secondary | ICD-10-CM | POA: Diagnosis not present

## 2021-07-27 DIAGNOSIS — M6281 Muscle weakness (generalized): Secondary | ICD-10-CM | POA: Diagnosis not present

## 2021-07-27 DIAGNOSIS — R2681 Unsteadiness on feet: Secondary | ICD-10-CM | POA: Diagnosis not present

## 2021-07-27 DIAGNOSIS — M810 Age-related osteoporosis without current pathological fracture: Secondary | ICD-10-CM | POA: Diagnosis not present

## 2021-07-27 DIAGNOSIS — R278 Other lack of coordination: Secondary | ICD-10-CM | POA: Diagnosis not present

## 2021-07-28 DIAGNOSIS — M6281 Muscle weakness (generalized): Secondary | ICD-10-CM | POA: Diagnosis not present

## 2021-07-28 DIAGNOSIS — M810 Age-related osteoporosis without current pathological fracture: Secondary | ICD-10-CM | POA: Diagnosis not present

## 2021-07-28 DIAGNOSIS — R2681 Unsteadiness on feet: Secondary | ICD-10-CM | POA: Diagnosis not present

## 2021-07-28 DIAGNOSIS — R278 Other lack of coordination: Secondary | ICD-10-CM | POA: Diagnosis not present

## 2021-07-28 DIAGNOSIS — Z9181 History of falling: Secondary | ICD-10-CM | POA: Diagnosis not present

## 2021-07-29 DIAGNOSIS — M6281 Muscle weakness (generalized): Secondary | ICD-10-CM | POA: Diagnosis not present

## 2021-07-29 DIAGNOSIS — R278 Other lack of coordination: Secondary | ICD-10-CM | POA: Diagnosis not present

## 2021-07-29 DIAGNOSIS — M810 Age-related osteoporosis without current pathological fracture: Secondary | ICD-10-CM | POA: Diagnosis not present

## 2021-07-29 DIAGNOSIS — R2681 Unsteadiness on feet: Secondary | ICD-10-CM | POA: Diagnosis not present

## 2021-07-29 DIAGNOSIS — Z9181 History of falling: Secondary | ICD-10-CM | POA: Diagnosis not present

## 2021-07-30 ENCOUNTER — Ambulatory Visit (INDEPENDENT_AMBULATORY_CARE_PROVIDER_SITE_OTHER): Payer: Medicare Other

## 2021-07-30 DIAGNOSIS — Z5181 Encounter for therapeutic drug level monitoring: Secondary | ICD-10-CM

## 2021-07-30 DIAGNOSIS — M810 Age-related osteoporosis without current pathological fracture: Secondary | ICD-10-CM | POA: Diagnosis not present

## 2021-07-30 DIAGNOSIS — Z9181 History of falling: Secondary | ICD-10-CM | POA: Diagnosis not present

## 2021-07-30 DIAGNOSIS — Z7901 Long term (current) use of anticoagulants: Secondary | ICD-10-CM | POA: Diagnosis not present

## 2021-07-30 DIAGNOSIS — R2681 Unsteadiness on feet: Secondary | ICD-10-CM | POA: Diagnosis not present

## 2021-07-30 DIAGNOSIS — R278 Other lack of coordination: Secondary | ICD-10-CM | POA: Diagnosis not present

## 2021-07-30 DIAGNOSIS — M6281 Muscle weakness (generalized): Secondary | ICD-10-CM | POA: Diagnosis not present

## 2021-07-30 LAB — POCT INR: INR: 2.9 (ref 2.0–3.0)

## 2021-07-30 NOTE — Patient Instructions (Signed)
Description   Continue to take warfarin 1 tablet daily except for 1/2 a tablet on Monday, Wednesday and Fridays.  Recheck INR in 6 weeks.  [phone]

## 2021-08-02 DIAGNOSIS — M810 Age-related osteoporosis without current pathological fracture: Secondary | ICD-10-CM | POA: Diagnosis not present

## 2021-08-02 DIAGNOSIS — Z9181 History of falling: Secondary | ICD-10-CM | POA: Diagnosis not present

## 2021-08-02 DIAGNOSIS — R2681 Unsteadiness on feet: Secondary | ICD-10-CM | POA: Diagnosis not present

## 2021-08-02 DIAGNOSIS — M6281 Muscle weakness (generalized): Secondary | ICD-10-CM | POA: Diagnosis not present

## 2021-08-02 DIAGNOSIS — R278 Other lack of coordination: Secondary | ICD-10-CM | POA: Diagnosis not present

## 2021-08-03 DIAGNOSIS — Z9181 History of falling: Secondary | ICD-10-CM | POA: Diagnosis not present

## 2021-08-03 DIAGNOSIS — M810 Age-related osteoporosis without current pathological fracture: Secondary | ICD-10-CM | POA: Diagnosis not present

## 2021-08-03 DIAGNOSIS — R278 Other lack of coordination: Secondary | ICD-10-CM | POA: Diagnosis not present

## 2021-08-03 DIAGNOSIS — M6281 Muscle weakness (generalized): Secondary | ICD-10-CM | POA: Diagnosis not present

## 2021-08-03 DIAGNOSIS — R2681 Unsteadiness on feet: Secondary | ICD-10-CM | POA: Diagnosis not present

## 2021-08-05 DIAGNOSIS — Z9181 History of falling: Secondary | ICD-10-CM | POA: Diagnosis not present

## 2021-08-05 DIAGNOSIS — M6281 Muscle weakness (generalized): Secondary | ICD-10-CM | POA: Diagnosis not present

## 2021-08-05 DIAGNOSIS — R278 Other lack of coordination: Secondary | ICD-10-CM | POA: Diagnosis not present

## 2021-08-05 DIAGNOSIS — R2681 Unsteadiness on feet: Secondary | ICD-10-CM | POA: Diagnosis not present

## 2021-08-05 DIAGNOSIS — M810 Age-related osteoporosis without current pathological fracture: Secondary | ICD-10-CM | POA: Diagnosis not present

## 2021-08-06 DIAGNOSIS — M6281 Muscle weakness (generalized): Secondary | ICD-10-CM | POA: Diagnosis not present

## 2021-08-06 DIAGNOSIS — R278 Other lack of coordination: Secondary | ICD-10-CM | POA: Diagnosis not present

## 2021-08-06 DIAGNOSIS — Z9181 History of falling: Secondary | ICD-10-CM | POA: Diagnosis not present

## 2021-08-06 DIAGNOSIS — R2681 Unsteadiness on feet: Secondary | ICD-10-CM | POA: Diagnosis not present

## 2021-08-06 DIAGNOSIS — M810 Age-related osteoporosis without current pathological fracture: Secondary | ICD-10-CM | POA: Diagnosis not present

## 2021-08-09 ENCOUNTER — Telehealth: Payer: Self-pay | Admitting: Family Medicine

## 2021-08-09 MED ORDER — AMOXICILLIN 500 MG PO CAPS: ORAL_CAPSULE | ORAL | 1 refills | Status: DC

## 2021-08-09 NOTE — Telephone Encounter (Signed)
Patient requests RX [facility] Am[facility]icillin [facility]   [id number] [place] [place]  [phone] Fax:  [phone]    Patient states she needs to take the above medication prior to dental appointments. Patient has a dental appointment today at 1:30 pm.

## 2021-08-10 ENCOUNTER — Telehealth: Payer: Medicare Other

## 2021-08-10 ENCOUNTER — Telehealth: Payer: Self-pay | Admitting: *Deleted

## 2021-08-10 DIAGNOSIS — R278 Other lack of coordination: Secondary | ICD-10-CM | POA: Diagnosis not present

## 2021-08-10 DIAGNOSIS — R2681 Unsteadiness on feet: Secondary | ICD-10-CM | POA: Diagnosis not present

## 2021-08-10 DIAGNOSIS — M6281 Muscle weakness (generalized): Secondary | ICD-10-CM | POA: Diagnosis not present

## 2021-08-10 DIAGNOSIS — M810 Age-related osteoporosis without current pathological fracture: Secondary | ICD-10-CM | POA: Diagnosis not present

## 2021-08-10 DIAGNOSIS — Z9181 History of falling: Secondary | ICD-10-CM | POA: Diagnosis not present

## 2021-08-10 NOTE — Telephone Encounter (Signed)
  Care Management   Follow Up Note   08/10/2021 Name: Robin Harrell MRN: [id number] DOB: 13-Mar-1933   Referred by: [name], MD Reason for referral : Chronic Care Management (CHF, COPD)   An unsuccessful telephone outreach was attempted today. The patient was referred to the case management team for assistance with care management and care coordination.   Follow Up Plan: RNCM will seek assistance from Care Guides in rescheduling appointment within the next 30 days.  [name] RN, MSN RN Care Management Coordinator  [facility] [phone] [email or web address]

## 2021-08-11 ENCOUNTER — Telehealth: Payer: Self-pay | Admitting: Family Medicine

## 2021-08-11 DIAGNOSIS — R278 Other lack of coordination: Secondary | ICD-10-CM | POA: Diagnosis not present

## 2021-08-11 DIAGNOSIS — M6281 Muscle weakness (generalized): Secondary | ICD-10-CM | POA: Diagnosis not present

## 2021-08-11 DIAGNOSIS — R2681 Unsteadiness on feet: Secondary | ICD-10-CM | POA: Diagnosis not present

## 2021-08-11 DIAGNOSIS — Z9181 History of falling: Secondary | ICD-10-CM | POA: Diagnosis not present

## 2021-08-11 DIAGNOSIS — M810 Age-related osteoporosis without current pathological fracture: Secondary | ICD-10-CM | POA: Diagnosis not present

## 2021-08-11 NOTE — Telephone Encounter (Signed)
FYI: [facility] FO Rep attempted to schedule appt., patient declined.

## 2021-08-11 NOTE — Telephone Encounter (Signed)
LVM dor Pt to let her know that [facility] sent in 16 amoxicillian pills that should be enough for 4 dental surgeries.

## 2021-08-11 NOTE — Telephone Encounter (Signed)
Pt requests an update about this request.   Please call back.

## 2021-08-11 NOTE — Telephone Encounter (Signed)
Vml for pt to call back and further discuss her request.-    Client [facility] Client Site [facility] Provider Billey Chang- MD Contact Type Call Who Is Calling Patient / Member / Family / Caregiver Caller Name [place] Phone Number [phone] Call Type Message Only Information Provided Reason for Call Returning a Call from the Office Initial [place] is returning a call from the office. They do not want to speak with a nurse. This is in regard to a medication request. Additional Comment DOB is 1933-10-07. Provided office hours

## 2021-08-11 NOTE — Telephone Encounter (Signed)
Jonni Sanger will take care of medication request

## 2021-08-11 NOTE — Telephone Encounter (Signed)
See phone note: Chronic Care Management for follow up.

## 2021-08-12 ENCOUNTER — Encounter: Payer: Self-pay | Admitting: Family Medicine

## 2021-08-12 ENCOUNTER — Ambulatory Visit (INDEPENDENT_AMBULATORY_CARE_PROVIDER_SITE_OTHER): Payer: Medicare Other | Admitting: *Deleted

## 2021-08-12 DIAGNOSIS — I10 Essential (primary) hypertension: Secondary | ICD-10-CM

## 2021-08-12 DIAGNOSIS — I5189 Other ill-defined heart diseases: Secondary | ICD-10-CM

## 2021-08-12 NOTE — Chronic Care Management (AMB) (Signed)
  Care Management   Follow Up Note   08/12/2021 Name: Robin Harrell MRN: [id number] DOB: 11/03/1933   Referred by: [name], MD Reason for referral : Chronic Care Management and Case Closure   A second unsuccessful telephone outreach was attempted today. The patient was referred to the case management team for assistance with care management and care coordination.  Spoke with daughter, Robin Harrell, who is in town.  States patient is settling in facility but feels her memory is getting worse.  Encourage call PCP for appointment.  Follow Up Plan: No further follow up required: as  patient declines further outreaches and patient is settling in at [facility].   [name] RN, MSN RN Care Management Coordinator  [place] [phone] [email or web address]

## 2021-08-12 NOTE — Patient Instructions (Signed)
Visit Information  Thank you for allowing me to share the care management and care coordination services that are available to you as part of your health plan and services through your primary care provider and medical home. Please reach out to me at [phone] if the care management/care coordination team may be of assistance to you in the future.   Adamari Frede RN, MSN RN Care Management Coordinator  [facility] [phone] [email or web address]  

## 2021-08-13 DIAGNOSIS — R278 Other lack of coordination: Secondary | ICD-10-CM | POA: Diagnosis not present

## 2021-08-13 DIAGNOSIS — R2681 Unsteadiness on feet: Secondary | ICD-10-CM | POA: Diagnosis not present

## 2021-08-13 DIAGNOSIS — M6281 Muscle weakness (generalized): Secondary | ICD-10-CM | POA: Diagnosis not present

## 2021-08-13 DIAGNOSIS — Z9181 History of falling: Secondary | ICD-10-CM | POA: Diagnosis not present

## 2021-08-13 DIAGNOSIS — M810 Age-related osteoporosis without current pathological fracture: Secondary | ICD-10-CM | POA: Diagnosis not present

## 2021-08-16 DIAGNOSIS — Z9181 History of falling: Secondary | ICD-10-CM | POA: Diagnosis not present

## 2021-08-16 DIAGNOSIS — R2681 Unsteadiness on feet: Secondary | ICD-10-CM | POA: Diagnosis not present

## 2021-08-16 DIAGNOSIS — M6281 Muscle weakness (generalized): Secondary | ICD-10-CM | POA: Diagnosis not present

## 2021-08-16 DIAGNOSIS — M810 Age-related osteoporosis without current pathological fracture: Secondary | ICD-10-CM | POA: Diagnosis not present

## 2021-08-16 DIAGNOSIS — R278 Other lack of coordination: Secondary | ICD-10-CM | POA: Diagnosis not present

## 2021-08-17 DIAGNOSIS — R278 Other lack of coordination: Secondary | ICD-10-CM | POA: Diagnosis not present

## 2021-08-17 DIAGNOSIS — M810 Age-related osteoporosis without current pathological fracture: Secondary | ICD-10-CM | POA: Diagnosis not present

## 2021-08-17 DIAGNOSIS — Z9181 History of falling: Secondary | ICD-10-CM | POA: Diagnosis not present

## 2021-08-17 DIAGNOSIS — M6281 Muscle weakness (generalized): Secondary | ICD-10-CM | POA: Diagnosis not present

## 2021-08-17 DIAGNOSIS — R2681 Unsteadiness on feet: Secondary | ICD-10-CM | POA: Diagnosis not present

## 2021-08-18 DIAGNOSIS — R278 Other lack of coordination: Secondary | ICD-10-CM | POA: Diagnosis not present

## 2021-08-18 DIAGNOSIS — R2681 Unsteadiness on feet: Secondary | ICD-10-CM | POA: Diagnosis not present

## 2021-08-18 DIAGNOSIS — Z9181 History of falling: Secondary | ICD-10-CM | POA: Diagnosis not present

## 2021-08-18 DIAGNOSIS — M810 Age-related osteoporosis without current pathological fracture: Secondary | ICD-10-CM | POA: Diagnosis not present

## 2021-08-18 DIAGNOSIS — M6281 Muscle weakness (generalized): Secondary | ICD-10-CM | POA: Diagnosis not present

## 2021-08-19 DIAGNOSIS — R278 Other lack of coordination: Secondary | ICD-10-CM | POA: Diagnosis not present

## 2021-08-19 DIAGNOSIS — R2681 Unsteadiness on feet: Secondary | ICD-10-CM | POA: Diagnosis not present

## 2021-08-19 DIAGNOSIS — M810 Age-related osteoporosis without current pathological fracture: Secondary | ICD-10-CM | POA: Diagnosis not present

## 2021-08-19 DIAGNOSIS — Z9181 History of falling: Secondary | ICD-10-CM | POA: Diagnosis not present

## 2021-08-19 DIAGNOSIS — M6281 Muscle weakness (generalized): Secondary | ICD-10-CM | POA: Diagnosis not present

## 2021-08-20 DIAGNOSIS — M6281 Muscle weakness (generalized): Secondary | ICD-10-CM | POA: Diagnosis not present

## 2021-08-20 DIAGNOSIS — M810 Age-related osteoporosis without current pathological fracture: Secondary | ICD-10-CM | POA: Diagnosis not present

## 2021-08-20 DIAGNOSIS — R278 Other lack of coordination: Secondary | ICD-10-CM | POA: Diagnosis not present

## 2021-08-20 DIAGNOSIS — Z9181 History of falling: Secondary | ICD-10-CM | POA: Diagnosis not present

## 2021-08-20 DIAGNOSIS — R2681 Unsteadiness on feet: Secondary | ICD-10-CM | POA: Diagnosis not present

## 2021-08-24 DIAGNOSIS — R278 Other lack of coordination: Secondary | ICD-10-CM | POA: Diagnosis not present

## 2021-08-24 DIAGNOSIS — M810 Age-related osteoporosis without current pathological fracture: Secondary | ICD-10-CM | POA: Diagnosis not present

## 2021-08-24 DIAGNOSIS — Z9181 History of falling: Secondary | ICD-10-CM | POA: Diagnosis not present

## 2021-08-24 DIAGNOSIS — M6281 Muscle weakness (generalized): Secondary | ICD-10-CM | POA: Diagnosis not present

## 2021-08-24 DIAGNOSIS — R2681 Unsteadiness on feet: Secondary | ICD-10-CM | POA: Diagnosis not present

## 2021-08-25 DIAGNOSIS — R2681 Unsteadiness on feet: Secondary | ICD-10-CM | POA: Diagnosis not present

## 2021-08-25 DIAGNOSIS — M810 Age-related osteoporosis without current pathological fracture: Secondary | ICD-10-CM | POA: Diagnosis not present

## 2021-08-25 DIAGNOSIS — R278 Other lack of coordination: Secondary | ICD-10-CM | POA: Diagnosis not present

## 2021-08-25 DIAGNOSIS — M6281 Muscle weakness (generalized): Secondary | ICD-10-CM | POA: Diagnosis not present

## 2021-08-25 DIAGNOSIS — Z9181 History of falling: Secondary | ICD-10-CM | POA: Diagnosis not present

## 2021-08-26 DIAGNOSIS — M6281 Muscle weakness (generalized): Secondary | ICD-10-CM | POA: Diagnosis not present

## 2021-08-26 DIAGNOSIS — R278 Other lack of coordination: Secondary | ICD-10-CM | POA: Diagnosis not present

## 2021-08-26 DIAGNOSIS — Z9181 History of falling: Secondary | ICD-10-CM | POA: Diagnosis not present

## 2021-08-26 DIAGNOSIS — R2681 Unsteadiness on feet: Secondary | ICD-10-CM | POA: Diagnosis not present

## 2021-08-26 DIAGNOSIS — M810 Age-related osteoporosis without current pathological fracture: Secondary | ICD-10-CM | POA: Diagnosis not present

## 2021-08-27 ENCOUNTER — Other Ambulatory Visit: Payer: Self-pay | Admitting: Emergency Medicine

## 2021-08-30 DIAGNOSIS — Z9181 History of falling: Secondary | ICD-10-CM | POA: Diagnosis not present

## 2021-08-30 DIAGNOSIS — M6281 Muscle weakness (generalized): Secondary | ICD-10-CM | POA: Diagnosis not present

## 2021-08-30 DIAGNOSIS — R2681 Unsteadiness on feet: Secondary | ICD-10-CM | POA: Diagnosis not present

## 2021-08-30 DIAGNOSIS — M810 Age-related osteoporosis without current pathological fracture: Secondary | ICD-10-CM | POA: Diagnosis not present

## 2021-08-30 DIAGNOSIS — R278 Other lack of coordination: Secondary | ICD-10-CM | POA: Diagnosis not present

## 2021-09-02 DIAGNOSIS — R2681 Unsteadiness on feet: Secondary | ICD-10-CM | POA: Diagnosis not present

## 2021-09-02 DIAGNOSIS — M6281 Muscle weakness (generalized): Secondary | ICD-10-CM | POA: Diagnosis not present

## 2021-09-02 DIAGNOSIS — R278 Other lack of coordination: Secondary | ICD-10-CM | POA: Diagnosis not present

## 2021-09-02 DIAGNOSIS — M810 Age-related osteoporosis without current pathological fracture: Secondary | ICD-10-CM | POA: Diagnosis not present

## 2021-09-02 DIAGNOSIS — Z9181 History of falling: Secondary | ICD-10-CM | POA: Diagnosis not present

## 2021-09-03 ENCOUNTER — Other Ambulatory Visit: Payer: Self-pay

## 2021-09-03 ENCOUNTER — Telehealth: Payer: Self-pay | Admitting: Family Medicine

## 2021-09-03 DIAGNOSIS — M6281 Muscle weakness (generalized): Secondary | ICD-10-CM | POA: Diagnosis not present

## 2021-09-03 DIAGNOSIS — R278 Other lack of coordination: Secondary | ICD-10-CM | POA: Diagnosis not present

## 2021-09-03 DIAGNOSIS — Z9181 History of falling: Secondary | ICD-10-CM | POA: Diagnosis not present

## 2021-09-03 DIAGNOSIS — I10 Essential (primary) hypertension: Secondary | ICD-10-CM

## 2021-09-03 DIAGNOSIS — M810 Age-related osteoporosis without current pathological fracture: Secondary | ICD-10-CM | POA: Diagnosis not present

## 2021-09-03 DIAGNOSIS — R2681 Unsteadiness on feet: Secondary | ICD-10-CM | POA: Diagnosis not present

## 2021-09-03 MED ORDER — LOSARTAN POTASSIUM 25 MG PO TABS: 25.0000 mg | ORAL_TABLET | Freq: Every day | ORAL | 3 refills | Status: DC

## 2021-09-03 NOTE — Telephone Encounter (Signed)
Patient's daughter, Krystal Clark called in regarding patient's losartan 25 mg. States that she has cutting in half her 50 mg pills so she can be taking 25 mg daily, but will soon be transferring over to having her medication placed into pill packs via [employer]. According to her daughter, the pharmacy informed her that for the medication to be packed properly there will need to be an Rx order for losartan 25 mg. This will also need to be done so the patient doesn't have to keep splitting pills. Krystal Clark, her daughter, may be contacted at [phone] for more information and updates.    Pharmacy: [facility]  [place] [address], [place] [address] [phone]

## 2021-09-03 NOTE — Telephone Encounter (Signed)
Losartan '25mg'$  has been sent to [facility]

## 2021-09-06 ENCOUNTER — Ambulatory Visit (HOSPITAL_COMMUNITY): Payer: Medicare Other | Attending: Internal Medicine

## 2021-09-06 DIAGNOSIS — I350 Nonrheumatic aortic (valve) stenosis: Secondary | ICD-10-CM | POA: Insufficient documentation

## 2021-09-06 DIAGNOSIS — M810 Age-related osteoporosis without current pathological fracture: Secondary | ICD-10-CM | POA: Diagnosis not present

## 2021-09-06 DIAGNOSIS — R278 Other lack of coordination: Secondary | ICD-10-CM | POA: Diagnosis not present

## 2021-09-06 DIAGNOSIS — M6281 Muscle weakness (generalized): Secondary | ICD-10-CM | POA: Diagnosis not present

## 2021-09-06 DIAGNOSIS — Z9181 History of falling: Secondary | ICD-10-CM | POA: Diagnosis not present

## 2021-09-06 DIAGNOSIS — R2681 Unsteadiness on feet: Secondary | ICD-10-CM | POA: Diagnosis not present

## 2021-09-06 LAB — ECHOCARDIOGRAM COMPLETE
AR max vel: 0.67 cm2
AV Area VTI: 0.67 cm2
AV Area mean vel: 0.67 cm2
AV Mean grad: 21 mmHg
AV Peak grad: 40.4 mmHg
Ao pk vel: 3.18 m/s
Area-P 1/2: 1.52 cm2
MV M vel: 4.36 m/s
MV Peak grad: 76 mmHg
MV VTI: 0.56 cm2
P 1/2 time: 228 msec
S' Lateral: 2.2 cm

## 2021-09-07 DIAGNOSIS — Z9181 History of falling: Secondary | ICD-10-CM | POA: Diagnosis not present

## 2021-09-07 DIAGNOSIS — M810 Age-related osteoporosis without current pathological fracture: Secondary | ICD-10-CM | POA: Diagnosis not present

## 2021-09-07 DIAGNOSIS — R2681 Unsteadiness on feet: Secondary | ICD-10-CM | POA: Diagnosis not present

## 2021-09-07 DIAGNOSIS — R278 Other lack of coordination: Secondary | ICD-10-CM | POA: Diagnosis not present

## 2021-09-07 DIAGNOSIS — M6281 Muscle weakness (generalized): Secondary | ICD-10-CM | POA: Diagnosis not present

## 2021-09-08 DIAGNOSIS — R2681 Unsteadiness on feet: Secondary | ICD-10-CM | POA: Diagnosis not present

## 2021-09-08 DIAGNOSIS — M6281 Muscle weakness (generalized): Secondary | ICD-10-CM | POA: Diagnosis not present

## 2021-09-08 DIAGNOSIS — Z9181 History of falling: Secondary | ICD-10-CM | POA: Diagnosis not present

## 2021-09-08 DIAGNOSIS — M810 Age-related osteoporosis without current pathological fracture: Secondary | ICD-10-CM | POA: Diagnosis not present

## 2021-09-08 DIAGNOSIS — R278 Other lack of coordination: Secondary | ICD-10-CM | POA: Diagnosis not present

## 2021-09-09 ENCOUNTER — Encounter: Payer: Self-pay | Admitting: Nurse Practitioner

## 2021-09-09 ENCOUNTER — Ambulatory Visit (INDEPENDENT_AMBULATORY_CARE_PROVIDER_SITE_OTHER): Payer: Medicare Other | Admitting: Nurse Practitioner

## 2021-09-09 ENCOUNTER — Ambulatory Visit (INDEPENDENT_AMBULATORY_CARE_PROVIDER_SITE_OTHER): Payer: Medicare Other

## 2021-09-09 VITALS — BP 122/82 | HR 83 | Temp 97.4°F | Ht 62.0 in | Wt 121.2 lb

## 2021-09-09 DIAGNOSIS — I350 Nonrheumatic aortic (valve) stenosis: Secondary | ICD-10-CM

## 2021-09-09 DIAGNOSIS — D682 Hereditary deficiency of other clotting factors: Secondary | ICD-10-CM | POA: Diagnosis not present

## 2021-09-09 DIAGNOSIS — R0602 Shortness of breath: Secondary | ICD-10-CM

## 2021-09-09 DIAGNOSIS — R053 Chronic cough: Secondary | ICD-10-CM

## 2021-09-09 DIAGNOSIS — J90 Pleural effusion, not elsewhere classified: Secondary | ICD-10-CM

## 2021-09-09 DIAGNOSIS — I5032 Chronic diastolic (congestive) heart failure: Secondary | ICD-10-CM

## 2021-09-09 DIAGNOSIS — R058 Other specified cough: Secondary | ICD-10-CM

## 2021-09-09 DIAGNOSIS — R0609 Other forms of dyspnea: Secondary | ICD-10-CM | POA: Diagnosis not present

## 2021-09-09 DIAGNOSIS — J310 Chronic rhinitis: Secondary | ICD-10-CM

## 2021-09-09 DIAGNOSIS — R059 Cough, unspecified: Secondary | ICD-10-CM | POA: Diagnosis not present

## 2021-09-09 MED ORDER — FUROSEMIDE 20 MG PO TABS: 20.0000 mg | ORAL_TABLET | Freq: Every day | ORAL | 0 refills | Status: DC

## 2021-09-09 MED ORDER — PREDNISONE 20 MG PO TABS: 40.0000 mg | ORAL_TABLET | Freq: Every day | ORAL | 0 refills | Status: AC

## 2021-09-09 NOTE — Progress Notes (Signed)
_0  ID: Robin Harrell, female    DOB: 12-Jun-1933, 88 y.o.   MRN: [id number]  Chief Complaint  Patient presents with   Acute Visit    Pt states that her allergies have been horrible for x2 weeks. Pt productive cough with yellow sputum, very tired and SOB.     Referring provider: [name], MD  HPI: 88 year old female, former smoker followed for chronic cough d/t rhinitis, hx of COVID pneumonitis now with chronic hypoxic respiratory failure, GERD with hiatal hernia, and intermittent aspiration/choking with some associated hoarsenss. She is a patient of Dr. Agustina Caroli and last seen in office on 03/19/2021 by Tommy Minichiello,NP. Past medical history significant for chronic thromboembolic disease with PE and factor V Leiden deficiency on chronic anticoagulation therapy, hypertension, aortic stenosis, hypothyroidism, DJD, HLD.  TEST/EVENTS:  05/30/2019 CT chest without contrast: Atherosclerosis.  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  Minimal biapical scarring.  Subsegmental atelectasis at bases of lingula and right middle lobe and right lower lobe.  Tracheobronchial calcifications. 05/07/2020 echocardiogram: Moderate AS and MS.  Moderate MR.  EF 60 to 65%.  Mild LVH.  Moderately elevated PA pressure.  LA severely dilated.  Tricuspid valve regurgitation moderate. 06/08/2020 CXR 2 view: Borderline enlargement of cardiac silhouette.  Minimal blunting of the left lateral costophrenic angle, questionable for tiny pleural effusion.  Lungs otherwise clear.  03/19/2021: OV with Darrion Wyszynski NP.  Worsening cough and nasal congestion x2 weeks.  Nasal congestion with purulent drainage and occasional blood-tinged mucus.  Cough with minimal clear sputum production.  CXR with unchanged chronic blunting of left lateral costophrenic angle and some airway thickening. Treated for chronic bronchitis with prednisone and acute bacterial rhino sinusitis with amoxicillin course. Referral placed to ENT for chronic  symptoms despite multiple therapies   04/19/2021: OV with Todd Argabright NP for follow-up after being treated for bronchitis and acute bacterial rhinosinusitis a month ago.  She reports that her cough has significantly improved.  She does still have occasional mild, dry cough which is consistent with her baseline.  She continues to have some clear nasal drainage and occasionally has some blood-tinged mucus when she blows her nose.  Despite multiple treatment regimens, she continues to have issues with chronic rhinitis.  She was referred to ENT last time; however has not heard from their office at this point.  It appears in the chart that she was referred to [facility] ear nose and throat.  She continues on Flonase and Atrovent nasal spray.  She uses Xyzal daily.  She continues to blow her nose, although we discussed at last visit trying to avoid doing so frequently and patting/blotting instead to prevent further irritation. She denies wheezing, shortness of breath, orthopnea, PND, leg swelling or hemoptysis. Overall, she feels better and offers no further complaints.   09/09/2021: Today - acute Patient presents today for acute visit with family friend.  She feels like her nasal congestion has been worse for the past 2 weeks or so.  Occasionally blood-tinged, which is not necessarily uncommon for her.  She is on Coumadin.  No mass epistaxis.  She has also developed a productive cough with clear sputum.  She feels much more fatigued and is now starting to get short of breath with exertion, which is new from her baseline.  She has also had some worsening swelling in both of her lower legs.  She also has some pain on her left side with coughing. She denies any fevers, night sweats, hemoptysis, orthopnea, PND,  palpitations.  No known recent sick exposures.  She is using Flonase and Atrovent for her nasal symptoms.  Never went to go see ENT.  She is also on Xyzal for allergies. Recently had echocardiogram - hasn't heard results  yet. She has not missed any doses of coumadin. Denies calf pain or redness.   Allergies  Allergen Reactions   Azithromycin Nausea Only   Codeine Nausea Only   Erythromycin Nausea And Vomiting    Immunization History  Administered Date(s) Administered   Fluad Quad(high Dose 65+) 10/17/2018, 11/18/2019, 11/04/2020   Influenza Split 11/21/2008, 02/09/2010, 10/24/2011, 11/21/2012   Influenza Whole 11/27/2008   Influenza, High Dose Seasonal PF 11/12/2011, 11/29/2012, 11/05/2013, 01/05/2015, 11/16/2015, 10/21/2016, 11/09/2017   Influenza,inj,Quad PF,6+ Mos 11/23/2015   Influenza-Unspecified 02/02/2011   PFIZER(Purple Top)SARS-COV-2 Vaccination 06/03/2019, 06/17/2019   Pfizer Covid-19 Vaccine Bivalent Booster 67yr & up 12/25/2020   Pneumococcal Conjugate-13 04/23/2013   Pneumococcal Polysaccharide-23 02/21/2005   Tdap 09/22/2007, 06/19/2021   Zoster Recombinat (Shingrix) 07/11/2016, 01/27/2017   Zoster, Live 04/22/2010    Past Medical History:  Diagnosis Date   Allergic rhinitis    Cough    Difficulty in swallowing    w/ occasional aspiration   DVT (deep venous thrombosis) (HCC)    Dyslipidemia    Factor V Leiden deficiency    lifelong coumadin   GERD (gastroesophageal reflux disease)    History of nuclear stress test 12/28/2007   lexiscan; low risk    HTN (hypertension)    PND (post-nasal drip)    Pneumonia due to COVID-19 virus 02/26/2019   02/25/2019-SARS-CoV-2-positive Hospitlized in [name]/2021 - [facility] -received IV steroids and remdesivir, did not appear to require mechanical ventilation   Pulmonary embolism (HCC)    PVC's (premature ventricular contractions)    Thyroid disease     Tobacco History: Social History   Tobacco Use  Smoking Status Former   Years: 1.00   Types: Cigarettes   Quit date: 02/21/1958   Years since quitting: 63.5  Smokeless Tobacco Never   Counseling given: Not Answered   Outpatient Medications Prior to Visit  Medication Sig Dispense Refill    acetaminophen (TYLENOL) 500 MG tablet Take 1,000 mg by mouth every 6 (six) hours as needed for moderate pain.     atorvastatin (LIPITOR) 10 MG tablet TAKE 1 TABLET (10 MG TOTAL) BY MOUTH DAILY AT 6 PM. 90 tablet 3   brimonidine (ALPHAGAN) 0.2 % ophthalmic solution Place 1 drop into both eyes 2 (two) times daily. 5 mL 1   Calcium-Phosphorus-Vitamin D (CALCIUM/D3 ADULT GUMMIES) 200-96.6-200 MG-MG-UNIT CHEW Chew 2 tablets by mouth daily.     fluticasone (FLONASE) 50 MCG/ACT nasal spray SPRAY 2 SPRAYS INTO EACH NOSTRIL EVERY DAY (Patient taking differently: Place 2 sprays into both nostrils daily.) 48 mL 1   ipratropium (ATROVENT) 0.03 % nasal spray PLACE 2 SPRAYS INTO BOTH NOSTRILS 3 (THREE) TIMES DAILY AS NEEDED FOR RHINITIS. 90 mL 0   latanoprost (XALATAN) 0.005 % ophthalmic solution Place 1 drop into both eyes at bedtime.      levocetirizine (XYZAL) 5 MG tablet TAKE 1 TABLET BY MOUTH EVERY DAY IN THE EVENING 90 tablet 3   levothyroxine (SYNTHROID) 25 MCG tablet Take 1 tablet (25 mcg total) by mouth daily before breakfast. 90 tablet 3   losartan (COZAAR) 25 MG tablet Take 1 tablet (25 mg total) by mouth daily. 90 tablet 3   metoprolol tartrate (LOPRESSOR) 25 MG tablet Take 1 tablet (25 mg total) by mouth 2 (two) times  daily. 180 tablet 3   multivitamin-iron-minerals-folic acid (CENTRUM) chewable tablet Chew 1 tablet by mouth daily.     warfarin (COUMADIN) 7.5 MG tablet TAKE 1/2 TO 1 TABLET BY MOUTH DAILY AS DIRECTED (Patient taking differently: Take 7.5 mg by mouth daily. 1/2 tab M,W, F and 1 tablet on T,Th,S,Su) 90 tablet 1   furosemide (LASIX) 20 MG tablet Take 1 tablet (20 mg total) by mouth daily as needed for edema. 30 tablet 3   amoxicillin (AMOXIL) 500 MG capsule Take 4 caps 1 hour prior to dental procedures as needed 16 capsule 1   azelastine (ASTELIN) 0.1 % nasal spray Place 2 sprays into both nostrils 2 (two) times daily. Use in each nostril as directed 30 mL 3   vitamin E 1000 UNIT capsule  Take 1,000 Units by mouth daily.     No facility-administered medications prior to visit.     Review of Systems:   Constitutional: No weight loss or gain, night sweats, fevers, chills. +fatigue HEENT: No headaches, difficulty swallowing, tooth/dental problems, or sore throat. No sneezing, itching, ear ache. +nasal congestion, chronic rhinorrhea (clear; sometimes blood tinged) CV:  +swelling in lower extremities. No chest pain, orthopnea, PND, anasarca, dizziness, palpitations, syncope Resp: +productive cough (increased); shortness of breath with exertion; left pleuritic pain.  No excess mucus or change in color of mucus. No hemoptysis. No wheezing.  No chest wall deformity GI:  No heartburn, indigestion, abdominal pain, nausea, vomiting, diarrhea, change in bowel habits, loss of appetite, bloody stools.  GU: No dysuria, change in color of urine, urgency or frequency.  No flank pain, no hematuria  Skin: No rash, lesions, ulcerations MSK:  No joint pain or swelling.  No decreased range of motion.  No back pain. Neuro: No dizziness or lightheadedness.  Psych: No depression or anxiety. Mood stable.     Physical Exam:  BP 122/82 (BP Location: Right Arm, Patient Position: Sitting, Cuff Size: Normal)   Pulse 83   Temp (!) 97.4 F (36.3 C) (Oral)   Ht _0  (1.575 m)   Wt 121 lb 3.2 oz (55 kg)   LMP  (LMP Unknown)   SpO2 97%   BMI 22.17 kg/m   GEN: Pleasant, interactive, well-kempt; elderly, frail; in no acute distress. HEENT:  Normocephalic and atraumatic. EACs patent bilaterally. TM pearly gray with present light reflex bilaterally. PERRLA. Sclera white. Nasal turbinates erythematous, moist and patent bilaterally. Clear rhinorrhea present. Oropharynx pink and moist, without exudate or edema. No lesions, ulcerations NECK:  Supple w/ fair ROM. No JVD present. Normal carotid impulses w/o bruits. Thyroid symmetrical with no goiter or nodules palpated. No lymphadenopathy.   CV: RRR,  systolic murmur, +2 pitting BLE edema. Pulses intact, +2 bilaterally. No cyanosis, pallor or clubbing. PULMONARY:  Unlabored, regular breathing. Diminished bases bilaterally otherwise clear A&P w/o wheezes/rales/rhonchi. No accessory muscle use. No dullness to percussion. GI: BS present and normoactive. Soft, non-tender to palpation. No organomegaly or masses detected. No CVA tenderness. MSK: No erythema, warmth or tenderness. Cap refil <2 sec all extrem. No deformities or joint swelling noted.  Neuro: A/Ox3. No focal deficits noted.   Skin: Warm, no lesions or rashe Psych: Normal affect and behavior. Judgement and thought content appropriate.     Lab Results:  CBC    Component Value Date/Time   WBC 5.1 09/10/2021 1410   RBC 3.14 (L) 09/10/2021 1410   HGB 9.9 (L) 09/10/2021 1410   HCT 29.7 (L) 09/10/2021 1410   PLT 163.0 09/10/2021  1410   MCV 94.6 09/10/2021 1410   MCH 30.9 11/18/2019 1605   MCHC 33.5 09/10/2021 1410   RDW 13.9 09/10/2021 1410   LYMPHSABS 0.4 (L) 09/10/2021 1410   MONOABS 0.1 09/10/2021 1410   EOSABS 0.0 09/10/2021 1410   BASOSABS 0.0 09/10/2021 1410    BMET    Component Value Date/Time   NA 136 09/10/2021 1410   K 4.4 09/10/2021 1410   CL 102 09/10/2021 1410   CO2 26 09/10/2021 1410   GLUCOSE 134 (H) 09/10/2021 1410   BUN 23 09/10/2021 1410   CREATININE 0.90 09/10/2021 1410   CREATININE 1.05 (H) 11/18/2019 1601   CALCIUM 9.0 09/10/2021 1410   GFRNONAA 49 (L) 11/18/2019 1601   GFRAA 57 (L) 11/18/2019 1601    BNP    Component Value Date/Time   BNP 263.5 (H) 03/02/2019 0039     Imaging:  DG Chest 2 View  Result Date: 09/09/2021 CLINICAL DATA:  Productive cough EXAM: CHEST - 2 VIEW COMPARISON:  06/19/2021 FINDINGS: Frontal and lateral views of the chest demonstrate stable enlargement of the cardiac silhouette. Small left pleural effusion is increased since prior study. No acute airspace disease or pneumothorax. No acute bony abnormalities.  IMPRESSION: 1. Slight increase in small left pleural effusion since prior exam. 2. No acute airspace disease. Electronically Signed   By: Randa Ngo M.D.   On: 09/09/2021 17:24   ECHOCARDIOGRAM COMPLETE  Result Date: 09/06/2021    ECHOCARDIOGRAM REPORT   Patient Name:   Robin Harrell Date of Exam: 09/06/2021 Medical Rec #:  [id number]         Height:       64.0 in Accession #:    [id number]        Weight:       115.8 lb Date of Birth:  November 03, 1933        BSA:          1.551 m Patient Age:    55 years          BP:           100/50 mmHg Patient Gender: F                 HR:           88 bpm. Exam Location:  [place] Procedure: 2D Echo, Cardiac Doppler and Color Doppler Indications:    R06.02 SOB; R60.0 Lower extremity edema; I35.2 Nonrheumatic                 aortic (valve) stenosis with insufficiency  History:        Patient has prior history of Echocardiogram examinations, most                 recent 05/07/2020. Aortic Valve Disease and Mitral Valve Disease,                 Signs/Symptoms:Shortness of Breath and Edema; Risk                 Factors:Hypertension, Dyslipidemia and Former Smoker. Patient                 denies chest pain. She does have SOB with leg edema.  Sonographer:    Salvadore Dom RVT, RDCS (AE), RDMS Referring Phys: Lorretta Harp  Sonographer Comments: Suboptimal apical window and suboptimal parasternal window. Image acquisition challenging due to patient body habitus and severe kyphosis. IMPRESSIONS  1. Compared to echo report from March 2022,  valves appear more stenotic.  2. Left ventricular ejection fraction, by estimation, is 65 to 70%. The left ventricle has normal function. The left ventricle has no regional wall motion abnormalities. There is mild concentric left ventricular hypertrophy. Left ventricular diastolic parameters are consistent with Grade III diastolic dysfunction (restrictive). Elevated left atrial pressure.  3. Right ventricular systolic function is severely  reduced. The right ventricular size is mildly enlarged.  4. Left atrial size was severely dilated.  5. Right atrial size was mild to moderately dilated.  6. MV is thickened, calcified with restricted motion. Difficult to see individual leaflets well. Peak and mean gradients through the valve are 27 and 9 mm Hg respectively MVA (VTI) is 0.56 cm2 overall consistent with severe MS. (HR 70 bpm). Mild to moderate mitral valve regurgitation.  7. Tricuspid valve regurgitation is moderate.  8. AV leaflets are severely thickened Peak and mean gradients through the valve are 41 and 21 mm Hg respectively. [name] (VTI) is 0.67 cm2 . Dimensionless index is o.21 consistent with low gradient severe AS. Marland Kitchen Aortic valve regurgitation is not visualized.  9. Pulmonic valve regurgitation is moderate. 10. The inferior vena cava is dilated in size with <50% respiratory variability, suggesting right atrial pressure of 15 mmHg. Comparison(s): EF 60%, mild LVH, moderate MAC, moderate MS mean 44mHg peak 18.583mg, moderate AS mean 19 mmHg, peak 34.5 mmHg, AI. FINDINGS  Left Ventricle: Left ventricular ejection fraction, by estimation, is 65 to 70%. The left ventricle has normal function. The left ventricle has no regional wall motion abnormalities. The left ventricular internal cavity size was normal in size. There is  mild concentric left ventricular hypertrophy. Left ventricular diastolic parameters are consistent with Grade III diastolic dysfunction (restrictive). Elevated left atrial pressure. Right Ventricle: The right ventricular size is mildly enlarged. Right vetricular wall thickness was not assessed. Right ventricular systolic function is severely reduced. Left Atrium: Left atrial size was severely dilated. Right Atrium: Right atrial size was mild to moderately dilated. Pericardium: Trivial pericardial effusion is present. Mitral Valve: MV is thickened, calcified with restricted motion. Difficult to see individual leaflets well. Peak and  mean gradients through the valve are 27 and 9 mm Hg respectively MVA (VTI) is 0.56 cm2 overall consistent with severe MS. (HR 70 bpm). Mild to moderate mitral valve regurgitation. MV peak gradient, 23.9 mmHg. The mean mitral valve gradient is 10.0 mmHg. Tricuspid Valve: The tricuspid valve is normal in structure. Tricuspid valve regurgitation is moderate. Aortic Valve: AV leaflets are severely thickened Peak and mean gradients through the valve are 41 and 21 mm Hg respectively. [name] (VTI) is 0.67 cm2 . Dimensionless index is o.21 consistent with low gradient severe AS. Aortic valve regurgitation is not visualized. Aortic regurgitation PHT measures 228 msec. Aortic valve mean gradient measures 21.0 mmHg. Aortic valve peak gradient measures 40.4 mmHg. Aortic valve area, by VTI measures 0.67 cm. Pulmonic Valve: The pulmonic valve was not well visualized. Pulmonic valve regurgitation is moderate. Aorta: The aortic root and ascending aorta are structurally normal, with no evidence of dilitation. Venous: The inferior vena cava is dilated in size with less than 50% respiratory variability, suggesting right atrial pressure of 15 mmHg. IAS/Shunts: No atrial level shunt detected by color flow Doppler.  LEFT VENTRICLE PLAX 2D LVIDd:         3.40 cm   Diastology LVIDs:         2.20 cm   LV e' medial:   3.32 cm/s LV PW:  1.30 cm   LV E/e' medial: 70.2 LV IVS:        1.40 cm LVOT diam:     2.00 cm LV SV:         43 LV SV Index:   28 LVOT Area:     3.14 cm  RIGHT VENTRICLE RV S prime:     12.50 cm/s TAPSE (M-mode): 1.8 cm LEFT ATRIUM              Index        RIGHT ATRIUM           Index LA diam:        5.00 cm  3.22 cm/m   RA Area:     19.40 cm LA Vol (A2C):   88.9 ml  57.32 ml/m  RA Volume:   54.80 ml  35.33 ml/m LA Vol (A4C):   123.0 ml 79.31 ml/m LA Biplane Vol: 108.0 ml 69.64 ml/m  AORTIC VALVE                     PULMONIC VALVE AV Area (Vmax):    0.67 cm      PV Vmax:       0.75 m/s AV Area (Vmean):   0.67 cm       PV Peak grad:  2.3 mmHg AV Area (VTI):     0.67 cm AV Vmax:           318.00 cm/s AV Vmean:          214.000 cm/s AV VTI:            0.639 m AV Peak Grad:      40.4 mmHg AV Mean Grad:      21.0 mmHg LVOT Vmax:         67.90 cm/s LVOT Vmean:        45.400 cm/s LVOT VTI:          0.137 m LVOT/AV VTI ratio: 0.21 AI PHT:            228 msec  AORTA Ao Root diam: 2.50 cm Ao Asc diam:  3.40 cm Ao Arch diam: 2.6 cm MITRAL VALVE                TRICUSPID VALVE MV Area (PHT): 1.52 cm     TR Peak grad:   61.2 mmHg MV Area VTI:   0.56 cm     TR Vmax:        391.00 cm/s MV Peak grad:  23.9 mmHg MV Mean grad:  10.0 mmHg    SHUNTS MV Vmax:       2.45 m/s     Systemic VTI:  0.14 m MV Vmean:      149.0 cm/s   Systemic Diam: 2.00 cm MV Decel Time: 499 msec MR Peak grad: 76.0 mmHg MR Vmax:      436.00 cm/s MV E velocity: 233.00 cm/s MV A velocity: 166.00 cm/s MV E/A ratio:  1.40 Dorris Carnes MD Electronically signed by Dorris Carnes MD Signature Date/Time: 09/06/2021/3:56:14 PM    Final           No data to display          No results found for: "NITRICOXIDE"      Assessment & Plan:   Chronic cough Worsening cough with new DOE. Symptoms appear to be consistent with worsening valvular disease, right heart dysfunction, and diastolic CHF. No evidence of superimposed infection on imaging  today. We will check CBC with diff, BNP, and BMET today for further evaluation. Will diurese her if kidney function stable. Message sent to Dr. Gwenlyn Found and pt advised to contact his office for acute OV. Oxygen sats stable on room air in office. Provided with ED precautions.  I am going to treat her with short prednisone burst as well for possible pleurisy related to effusion and bronchitis. Close follow up  Patient Instructions  -Continue Flonase nasal spray 2 sprays each nostril daily -Continue Xyzal 5 mg daily -Continue Atrovent nasal spray 2 sprays each nostril up to three times a day as needed for runny nose/congestion/post  nasal drip   Avoid blowing nose. Blot for any runny nose/drainage.   Saline nasal spray 2-3 times a day for nasal congestion/drainage Prednisone 40 mg daily for 5 days. Take in AM with food.  Restart lasix (furosemide) 20 mg for 5 days. Take in AM. We will start this tomorrow after we check your labs   Call Dr. Kennon Holter office tomorrow  to see if you can get an appointment with them next week    Follow up in two weeks with Dr. Lamonte Sakai or Alanson Aly. If symptoms do not improve or worsen, please contact office for sooner follow up or seek emergency care    DOE (dyspnea on exertion) New DOE over the past few weeks as well as increased cough, leg swelling and fatigue. See above plan.  Severe aortic stenosis Worsening valvular disease, diastolic dysfunction and new right heart dysfunction on recent echo, consistent with constellation of findings today. See above plan.  Chronic rhinitis Slight increase from baseline. Never went to see ENT for further evaluation. Advised to continue nasal sprays and start saline nasal spray. Monitor closely for worsening epistaxis and seek emergency care. Avoid blowing nose.   Factor V deficiency ([place]) She is on coumadin and followed by Dr. Gwenlyn Found. No recent excessive bruising, melena, or other signs of bleeding aside from blood tinged mucus when she blows her nose, which is not new for her. She has appt with coumadin clinic tomorrow. We will check CBC today to assess for possible anemia contributing to DOE.     Clayton Bibles, NP 09/10/2021  Pt aware and understands NP's role.

## 2021-09-09 NOTE — Patient Instructions (Addendum)
-  Continue Flonase nasal spray 2 sprays each nostril daily -Continue Xyzal 5 mg daily -Continue Atrovent nasal spray 2 sprays each nostril up to three times a day as needed for runny nose/congestion/post nasal drip   Avoid blowing nose. Blot for any runny nose/drainage.   Saline nasal spray 2-3 times a day for nasal congestion/drainage Prednisone 40 mg daily for 5 days. Take in AM with food.  Restart lasix (furosemide) 20 mg for 5 days. Take in AM. We will start this tomorrow after we check your labs   Call Dr. Kennon Holter office tomorrow  to see if you can get an appointment with them next week    Follow up in two weeks with Dr. Lamonte Sakai or Alanson Aly. If symptoms do not improve or worsen, please contact office for sooner follow up or seek emergency care

## 2021-09-10 ENCOUNTER — Encounter: Payer: Self-pay | Admitting: Nurse Practitioner

## 2021-09-10 ENCOUNTER — Ambulatory Visit (INDEPENDENT_AMBULATORY_CARE_PROVIDER_SITE_OTHER): Payer: Medicare Other

## 2021-09-10 ENCOUNTER — Other Ambulatory Visit (INDEPENDENT_AMBULATORY_CARE_PROVIDER_SITE_OTHER): Payer: Medicare Other

## 2021-09-10 ENCOUNTER — Encounter: Payer: Self-pay | Admitting: Cardiovascular Disease

## 2021-09-10 DIAGNOSIS — R600 Localized edema: Secondary | ICD-10-CM

## 2021-09-10 DIAGNOSIS — Z7901 Long term (current) use of anticoagulants: Secondary | ICD-10-CM

## 2021-09-10 DIAGNOSIS — M6281 Muscle weakness (generalized): Secondary | ICD-10-CM | POA: Diagnosis not present

## 2021-09-10 DIAGNOSIS — I5032 Chronic diastolic (congestive) heart failure: Secondary | ICD-10-CM

## 2021-09-10 DIAGNOSIS — I5189 Other ill-defined heart diseases: Secondary | ICD-10-CM

## 2021-09-10 DIAGNOSIS — I350 Nonrheumatic aortic (valve) stenosis: Secondary | ICD-10-CM

## 2021-09-10 DIAGNOSIS — R0602 Shortness of breath: Secondary | ICD-10-CM

## 2021-09-10 DIAGNOSIS — M810 Age-related osteoporosis without current pathological fracture: Secondary | ICD-10-CM | POA: Diagnosis not present

## 2021-09-10 DIAGNOSIS — R278 Other lack of coordination: Secondary | ICD-10-CM | POA: Diagnosis not present

## 2021-09-10 DIAGNOSIS — R0609 Other forms of dyspnea: Secondary | ICD-10-CM | POA: Insufficient documentation

## 2021-09-10 DIAGNOSIS — Z9181 History of falling: Secondary | ICD-10-CM | POA: Diagnosis not present

## 2021-09-10 DIAGNOSIS — R2681 Unsteadiness on feet: Secondary | ICD-10-CM | POA: Diagnosis not present

## 2021-09-10 LAB — BRAIN NATRIURETIC PEPTIDE: Pro B Natriuretic peptide (BNP): 450 pg/mL — ABNORMAL HIGH (ref 0.0–100.0)

## 2021-09-10 LAB — CBC WITH DIFFERENTIAL/PLATELET
Basophils Absolute: 0 10*3/uL (ref 0.0–0.1)
Basophils Relative: 0.8 % (ref 0.0–3.0)
Eosinophils Absolute: 0 10*3/uL (ref 0.0–0.7)
Eosinophils Relative: 0.4 % (ref 0.0–5.0)
HCT: 29.7 % — ABNORMAL LOW (ref 36.0–46.0)
Hemoglobin: 9.9 g/dL — ABNORMAL LOW (ref 12.0–15.0)
Lymphocytes Relative: 7.7 % — ABNORMAL LOW (ref 12.0–46.0)
Lymphs Abs: 0.4 10*3/uL — ABNORMAL LOW (ref 0.7–4.0)
MCHC: 33.5 g/dL (ref 30.0–36.0)
MCV: 94.6 fl (ref 78.0–100.0)
Monocytes Absolute: 0.1 10*3/uL (ref 0.1–1.0)
Monocytes Relative: 1.8 % — ABNORMAL LOW (ref 3.0–12.0)
Neutro Abs: 4.5 10*3/uL (ref 1.4–7.7)
Neutrophils Relative %: 89.3 % — ABNORMAL HIGH (ref 43.0–77.0)
Platelets: 163 10*3/uL (ref 150.0–400.0)
RBC: 3.14 Mil/uL — ABNORMAL LOW (ref 3.87–5.11)
RDW: 13.9 % (ref 11.5–15.5)
WBC: 5.1 10*3/uL (ref 4.0–10.5)

## 2021-09-10 LAB — POCT INR: INR: 2.9 (ref 2.0–3.0)

## 2021-09-10 LAB — BASIC METABOLIC PANEL
BUN: 23 mg/dL (ref 6–23)
CO2: 26 mEq/L (ref 19–32)
Calcium: 9 mg/dL (ref 8.4–10.5)
Chloride: 102 mEq/L (ref 96–112)
Creatinine, Ser: 0.9 mg/dL (ref 0.40–1.20)
GFR: 58.23 mL/min — ABNORMAL LOW (ref >60.00)
Glucose, Bld: 134 mg/dL — ABNORMAL HIGH (ref 70–99)
Potassium: 4.4 mEq/L (ref 3.5–5.1)
Sodium: 136 mEq/L (ref 135–145)

## 2021-09-10 NOTE — Progress Notes (Signed)
Patient was unable to answer, called Irven Baltimore (daughter) and she voiced understanding. Will talk to her mother to make sure she has an appointment with Dr. Gwenlyn Found. Will also ask about her stool color and let prescriber know. Will be coming next week to repeat her labs.

## 2021-09-10 NOTE — Assessment & Plan Note (Signed)
Slight increase from baseline. Never went to see ENT for further evaluation. Advised to continue nasal sprays and start saline nasal spray. Monitor closely for worsening epistaxis and seek emergency care. Avoid blowing nose.

## 2021-09-10 NOTE — Assessment & Plan Note (Signed)
New DOE over the past few weeks as well as increased cough, leg swelling and fatigue. See above plan.

## 2021-09-10 NOTE — Progress Notes (Signed)
Please notify patient that her BNP, which is a hormone that the heart produces when it has to pump too hard, is elevated. Consistent with her swelling on her legs and likely contributing to her SOB. Please have her take her lasix like we discussed yesterday. See if she was able to get an appt with Dr. Gwenlyn Found, as discussed yesterday.   Also, her hemoglobin is low, meaning she has some anemia. This is a little concerning given her coumadin use. We discussed this yesterday but please clarify that she hasn't had any dark or tarry stools recently. If she develops any or develops dizziness, lightheadedness or worsening SOB, go to ED over the weekend. Otherwise, please have her call her coumadin prescriber Monday to let them know.  Please have her come back in next Thursday or Friday for repeat labs. Thanks.

## 2021-09-10 NOTE — Assessment & Plan Note (Addendum)
Worsening cough with new DOE. Symptoms appear to be consistent with worsening valvular disease, right heart dysfunction, and diastolic CHF. No evidence of superimposed infection on imaging today. We will check CBC with diff, BNP, and BMET today for further evaluation. Will diurese her if kidney function stable. Message sent to Dr. Gwenlyn Found and pt advised to contact his office for acute OV. Oxygen sats stable on room air in office. Provided with ED precautions.  I am going to treat her with short prednisone burst as well for possible pleurisy related to effusion and bronchitis. Close follow up  Patient Instructions  -Continue Flonase nasal spray 2 sprays each nostril daily -Continue Xyzal 5 mg daily -Continue Atrovent nasal spray 2 sprays each nostril up to three times a day as needed for runny nose/congestion/post nasal drip   Avoid blowing nose. Blot for any runny nose/drainage.   Saline nasal spray 2-3 times a day for nasal congestion/drainage Prednisone 40 mg daily for 5 days. Take in AM with food.  Restart lasix (furosemide) 20 mg for 5 days. Take in AM. We will start this tomorrow after we check your labs   Call Dr. Kennon Holter office tomorrow  to see if you can get an appointment with them next week    Follow up in two weeks with Dr. Lamonte Sakai or Alanson Aly. If symptoms do not improve or worsen, please contact office for sooner follow up or seek emergency care

## 2021-09-10 NOTE — Assessment & Plan Note (Signed)
She is on coumadin and followed by Dr. Gwenlyn Found. No recent excessive bruising, melena, or other signs of bleeding aside from blood tinged mucus when she blows her nose, which is not new for her. She has appt with coumadin clinic tomorrow. We will check CBC today to assess for possible anemia contributing to DOE.

## 2021-09-10 NOTE — Assessment & Plan Note (Signed)
Worsening valvular disease, diastolic dysfunction and new right heart dysfunction on recent echo, consistent with constellation of findings today. See above plan.

## 2021-09-10 NOTE — Patient Instructions (Signed)
Continue to take warfarin 1 tablet daily except for 1/2 a tablet on Monday, Wednesday and Fridays. EAT GREENS DAILY WHILE ON PREDNISONE 5 DAYS... Recheck INR in 6 weeks.  [phone]

## 2021-09-13 ENCOUNTER — Telehealth: Payer: Self-pay | Admitting: Cardiovascular Disease

## 2021-09-13 DIAGNOSIS — Z9181 History of falling: Secondary | ICD-10-CM | POA: Diagnosis not present

## 2021-09-13 DIAGNOSIS — M810 Age-related osteoporosis without current pathological fracture: Secondary | ICD-10-CM | POA: Diagnosis not present

## 2021-09-13 DIAGNOSIS — R2681 Unsteadiness on feet: Secondary | ICD-10-CM | POA: Diagnosis not present

## 2021-09-13 DIAGNOSIS — R278 Other lack of coordination: Secondary | ICD-10-CM | POA: Diagnosis not present

## 2021-09-13 DIAGNOSIS — M6281 Muscle weakness (generalized): Secondary | ICD-10-CM | POA: Diagnosis not present

## 2021-09-13 NOTE — Telephone Encounter (Signed)
Patient calling to speak with Legrand Como regarding her elevated BNP, please advise.

## 2021-09-13 NOTE — Progress Notes (Signed)
Cardiology Office Note:    Date:  09/17/2021   ID:  Robin Harrell, DOB Dec 16, 1933, MRN [id number]  PCP:  [name], MD  Cardiologist:  Quay Burow, MD  Electrophysiologist:  None   Referring MD: [name], MD   Chief Complaint: possible acute CHF and worsening valvular disease  History of Present Illness:    Robin Harrell is a 88 y.o. female with a history of severe aortic stenosis, moderate mitral stenosis, PVCs on beta-blocker, COVID pneumonitis now with chronic hypoxic respiratory failure, factor V leiden with prior PE on life-long Coumadin, hypertension, hyperlipidemia, hypothyroidism, and GERD who is followed by Dr. Gwenlyn Found and presents today for evaluation of possible acute CHF.  Remote Myoview in 2009 was normal. Patient was admitted at [facility] in 02/2019 with viral pneumonitis secondary to COVID-19 pneumonia. Echo in 04/2019 showed LVEF of 60-65% with normal wall motion, normal RV, severe left atrial enlargement, mild to moderate mitral stenosis, mild mitral regurgitation, and mild aortic stenosis. Repeat Echo in 04/2020 for monitoring of valvular disease showed normal LV function with likely moderate mitral stenosis and moderate mitral regurgitation as well as moderate aortic stenosis. Patient was last seen by Dr. Gwenlyn Found in 09/2020 at which time she was doing relatively well from a cardiac standpoint. She reported mild dyspnea with exertion but this was not lifestyle limiting.   Since January 2023, patient has struggled with chronic bronchitis and chronic rhinitis with persistent cough and nasal congestion. She was treated with prednisone and antibiotics but symptoms persisted. Pulmonology has been managing this. She was also referred to ENT. She was most recently seen by Pulmonology on 09/09/2021 at which time she reported worsening nasal congestion over the past 2 weeks. She also reported that she had developed a productive cough and was feeling more  fatigue. In addition, she reported dyspnea on exertion as well as worsening lower extremity edema. Labs were significant for an elevated BNP of 450 and low hemoglobin of 9.9. She was advised to restart Lasix and was also treated with 5 day course of Prednisone. Most recent Echo on 09/06/2021 prior to Pulmonology visit showed LVEF of 65-70% with grade 3 diastolic dysfunction, mildly enlarged RV with severely reduced systolic function, biatrial enlargement, low gradient severe aortic stenosis with mean gradient of 21 mmHg, and severe mitral stenosis with mean gradient of 9 mmHg. Given signs of CHF on exam and worsening valvular disease, patient was advised to follow-up with Cardiology.  Patient presents today for follow-up. Here with daughter who drove in from [place]. We had a long discussion about her Echo results today and patient is clear that she does not want any surgery for her valvular disease. She continues to have a productive cough and constantly feels like she has phlegm in her throat. She also reports chronic dyspnea with exertion that may be slightly worse than usual but sounds like is stable overall. She had good urinary output while on the Lasix but had no improvement in her breathing or cough. She also has chronic lower extremity which may be a little worse recently. Right leg looks slightly bigger than the left. She has had surgery on her right leg in the past and per chart review it does not sound like this asymmetry is new. INR has been therapeutic so chance of DVT is very low. Weight is down 1 lbs form visit last week with Pulmonology but up 5 lbs from 06/2021. Daughter states that her biggest complaint is her chronic  cough not her dyspnea on exertion. No orthopnea or PND. Marland Kitchen No chest pain, palpitaoitns, lightheadedness dizziness syncope.  Past Medical History:  Diagnosis Date   Allergic rhinitis    Cough    Difficulty in swallowing    w/ occasional aspiration   DVT (deep venous thrombosis)  (HCC)    Dyslipidemia    Factor V Leiden deficiency    lifelong coumadin   GERD (gastroesophageal reflux disease)    History of nuclear stress test 12/28/2007   lexiscan; low risk    HTN (hypertension)    PND (post-nasal drip)    Pneumonia due to COVID-19 virus 02/26/2019   02/25/2019-SARS-CoV-2-positive Hospitlized in [name]/2021 - [place] -received IV steroids and remdesivir, did not appear to require mechanical ventilation   Pulmonary embolism ([place])    PVC's (premature ventricular contractions)    Thyroid disease     Past Surgical History:  Procedure Laterality Date   APPENDECTOMY     BUNIONECTOMY     CATARACT EXTRACTION W/ INTRAOCULAR LENS IMPLANT Right 11/02/2013   CHOLECYSTECTOMY  1973   ESOPHAGEAL DILATION     HAMMER TOE SURGERY     history of sleep study  12/27/2007   AHI during total sleep 0.9/hr and REM 1.5/hr; RDI during total sleep6.0/hr and REM 6.1/hr   REPLACEMENT TOTAL KNEE  01/11/2002   right   TRANSTHORACIC ECHOCARDIOGRAM  12/28/2007   borderline conc LVH with normal systolic function; MV mod thickened with mild MVP & mild MR    Current Medications: Current Meds  Medication Sig   acetaminophen (TYLENOL) 500 MG tablet Take 1,000 mg by mouth every 6 (six) hours as needed for moderate pain.   atorvastatin (LIPITOR) 10 MG tablet TAKE 1 TABLET (10 MG TOTAL) BY MOUTH DAILY AT 6 PM.   brimonidine (ALPHAGAN) 0.2 % ophthalmic solution Place 1 drop into both eyes 2 (two) times daily.   Calcium-Phosphorus-Vitamin D (CALCIUM/D3 ADULT GUMMIES) 200-96.6-200 MG-MG-UNIT CHEW Chew 2 tablets by mouth daily.   fluticasone (FLONASE) 50 MCG/ACT nasal spray SPRAY 2 SPRAYS INTO EACH NOSTRIL EVERY DAY (Patient taking differently: Place 2 sprays into both nostrils daily.)   furosemide (LASIX) 20 MG tablet Take 1 tablet daily as needed only or swelling, edema, weight gain of 3 lbs overnight or 5 lb weight gain in 1 week.   ipratropium (ATROVENT) 0.03 % nasal spray PLACE 2 SPRAYS INTO BOTH NOSTRILS  3 (THREE) TIMES DAILY AS NEEDED FOR RHINITIS.   latanoprost (XALATAN) 0.005 % ophthalmic solution Place 1 drop into both eyes at bedtime.    levocetirizine (XYZAL) 5 MG tablet TAKE 1 TABLET BY MOUTH EVERY DAY IN THE EVENING   levothyroxine (SYNTHROID) 25 MCG tablet Take 1 tablet (25 mcg total) by mouth daily before breakfast.   losartan (COZAAR) 25 MG tablet Take 1 tablet (25 mg total) by mouth daily.   metoprolol tartrate (LOPRESSOR) 25 MG tablet Take 1 tablet (25 mg total) by mouth 2 (two) times daily.   multivitamin-iron-minerals-folic acid (CENTRUM) chewable tablet Chew 1 tablet by mouth daily.   warfarin (COUMADIN) 7.5 MG tablet TAKE 1/2 TO 1 TABLET BY MOUTH DAILY AS DIRECTED (Patient taking differently: Take 7.5 mg by mouth daily. 1/2 tab M,W, F and 1 tablet on T,Th,S,Su)     Allergies:   Azithromycin, Codeine, and Erythromycin   Social History   Socioeconomic History   Marital status: Married    Spouse name: Not on file   Number of children: 4   Years of education: 85.5  Highest education level: Not on file  Occupational History   Occupation: Network engineer - retired  Tobacco Use   Smoking status: Former    Years: 1.00    Types: Cigarettes    Quit date: 02/21/1958    Years since quitting: 63.6   Smokeless tobacco: Never  Vaping Use   Vaping Use: Never used  Substance and Sexual Activity   Alcohol use: Yes    Comment: rareley one glass of wine   Drug use: No   Sexual activity: Never  Other Topics Concern   Not on file  Social History Narrative   Not on file   Social Determinants of Health   Financial Resource Strain: Low Risk  (12/25/2020)   Overall Financial Resource Strain (CARDIA)    Difficulty of Paying Living Expenses: Not hard at all  Food Insecurity: No Food Insecurity (12/25/2020)   Hunger Vital Sign    Worried About Running Out of Food in the Last Year: Never true    [place] in the Last Year: Never true  Transportation Needs: No Transportation Needs  (12/25/2020)   PRAPARE - Hydrologist (Medical): No    Lack of Transportation (Non-Medical): No  Physical Activity: Inactive (12/25/2020)   Exercise Vital Sign    Days of Exercise per Week: 0 days    Minutes of Exercise per Session: 0 min  Stress: Stress Concern Present (12/25/2020)   [place]    Feeling of Stress : To some extent  Social Connections: Moderately Integrated (12/25/2020)   Social Connection and Isolation Panel [NHANES]    Frequency of Communication with Friends and Family: More than three times a week    Frequency of Social Gatherings with Friends and Family: More than three times a week    Attends Religious Services: More than 4 times per year    Active Member of [employer] or Organizations: No    Attends Music therapist: Never    Marital Status: Married     Family History: The patient's family history includes Allergic rhinitis in her sister; Breast cancer (age of onset: 32) in her daughter; Cirrhosis in her father; Coronary artery disease in her maternal grandfather and paternal grandfather; Heart disease in her paternal grandmother; Lung cancer in her father; Skin cancer in her brother; Throat cancer in her maternal grandfather and paternal grandfather.  ROS:   Please see the history of present illness.     EKGs/Labs/Other Studies Reviewed:    The following studies were reviewed today:  Echocardiogram 09/06/2021: Impressions:  1. Compared to echo report from March 2022, valves appear more stenotic.   2. Left ventricular ejection fraction, by estimation, is 65 to 70%. The  left ventricle has normal function. The left ventricle has no regional  wall motion abnormalities. There is mild concentric left ventricular  hypertrophy. Left ventricular diastolic  parameters are consistent with Grade III diastolic dysfunction  (restrictive). Elevated left atrial pressure.    3. Right ventricular systolic function is severely reduced. The right  ventricular size is mildly enlarged.   4. Left atrial size was severely dilated.   5. Right atrial size was mild to moderately dilated.   6. MV is thickened, calcified with restricted motion. Difficult to see  individual leaflets well. Peak and mean gradients through the valve are 27  and 9 mm Hg respectively MVA (VTI) is 0.56 cm2 overall consistent with  severe MS. (HR 70 bpm).  Mild to  moderate mitral valve regurgitation.   7. Tricuspid valve regurgitation is moderate.   8. AV leaflets are severely thickened Peak and mean gradients through the  valve are 41 and 21 mm Hg respectively. [name] (VTI) is 0.67 cm2 .  Dimensionless index is o.21 consistent with low gradient severe AS. Marland Kitchen  Aortic valve regurgitation is not visualized.   9. Pulmonic valve regurgitation is moderate.  10. The inferior vena cava is dilated in size with <50% respiratory  variability, suggesting right atrial pressure of 15 mmHg.   Comparison(s): EF 60%, mild LVH, moderate MAC, moderate MS mean 31mHg peak 18.568mg, moderate AS mean 19 mmHg, peak 34.5 mmHg, AI.  EKG:  EKG ordered today. EKG personally reviewed and demonstrates normal sinus rhythm, rate 73 bpm, with 1st degree AV block and possible Q waves in leads V1-V3 and T wave inversion in lead V2. Left axis deviation. QTc 458 ms.  Recent Labs: 11/09/2020: ALT 15; TSH 3.12 09/10/2021: Pro B Natriuretic peptide (BNP) 450.0 09/17/2021: BUN 28; Creatinine, Ser 0.79; Hemoglobin 10.2; Platelets 173.0; Potassium 4.1; Sodium 134  Recent Lipid Panel    Component Value Date/Time   CHOL 150 11/09/2020 1105   TRIG 59.0 11/09/2020 1105   HDL 74.40 11/09/2020 1105   CHOLHDL 2 11/09/2020 1105   VLDL 11.8 11/09/2020 1105   LDLCALC 64 11/09/2020 1105   LDLCALC 65 11/18/2019 1605    Physical Exam:    Vital Signs: BP (!) 98/46 (BP Location: Right Arm, Patient Position: Sitting, Cuff Size: Normal)   Pulse  73   Ht _0  (1.575 m)   Wt 120 lb (54.4 kg)   LMP  (LMP Unknown)   BMI 21.95 kg/m     Wt Readings from Last 3 Encounters:  09/17/21 120 lb (54.4 kg)  09/09/21 121 lb 3.2 oz (55 kg)  07/14/21 115 lb 12.8 oz (52.5 kg)     General: [age].o. thin frail Caucasian female in no acute distress. HEENT: Normocephalic and atraumatic. Sclera clear.  Neck: Supple. No JVD. Heart: RRR. III/VI systolic murmur. No gallops or rubs. Lungs: No increased work of breathing. Clear to ausculation bilaterally. No wheezes, rhonchi, or rales.  Abdomen: Soft, non-distended, and non-tender to palpation.  Extremities: 1-2 + pitting edema of bilateral lower extremities (right > left).  Skin: Warm and dry. Neuro: Alert and oriented x3. No focal deficits. Psych: Normal affect. Responds appropriately.   Assessment:    1. Chronic heart failure with preserved ejection fraction ([facility]  2. RVF (right ventricular failure) ([facility]  3. Aortic valve stenosis, etiology of cardiac valve disease unspecified   4. Mitral valve stenosis, unspecified etiology   5. Factor V deficiency ([facility]  6. History of pulmonary embolus (PE)   7. Primary hypertension   8. Hyperlipidemia, unspecified hyperlipidemia type   9. Normocytic anemia     Plan:    Chronic HFpEF with RV Failure Patient recently seen by Pulmonology and reported worsening dyspnea on exertion and lower extremity edema. BNP was elevated at 450. Recent Echo on 09/06/2021 showed LVEF of 65-70% with grade 3 diastolic dysfunction, mildly enlarged RV with severely reduced systolic function, biatrial enlargement, low gradient severe aortic stenosis with mean gradient of 21 mmHg, and severe mitral stenosis with mean gradient of 9 mmHg. She was started on Lasix for 5 days. - She reports no significant improvement in dyspnea or lower extremity edema after 5 days of Lasix despite increased urinary output with this. She has lower  extremity edema on exam but otherwise appears  euvolemic. Weight down 1 lb from last week. - Patient had repeat BMET and CBC drawn at Pulmonology office earlier today and renal function stable. Will also recheck BNP to see how this compares to last week.  - Continue Lasix 9m as needed for weight gain (3lbs in 1 day or 5lbs in 1 week), worsening lower extremity edema, or worsening lower extremity edema. If BNP has increased, may continue her on daily Lasix. - Continue Losartan 289mdaily. - Continue Lopressor 2548mwice daily. - Discussed important of daily weight and sodium/fluid restrictions. - Suspect RV failure is due to her grade 3 diastolic dysfunction and severe AS and MS and I suspect her dyspnea is multifactorial due to her CHF, valvular disease, deconditioning, chronic sinusitis, and possible acute on chronic anemia. She is clear that she would not want surgery for her valvular disease; therefore, I don't think there is any need for a R/LHC. Will continue to try to manage symptoms by making sure she is adequately diuresed.  Severe Aortic Stenosis  Severe Mitral Stenosis Most recent Echo on 09/06/2021 showed normal LV function with worsening aortic and and mitral valve disease. AV leaflet are severely thickened and peak and mean gradient through the valve are 41 and 21 mmHg respectively. [name] is 0.67 cm2. Dimensionless index is 0.21 consistent with low gradient severe AS. MV is also thickened/calcified with restricted motion. Peak and mean gradient through the valve are 27 and 9 mmHg respectively with MVA of 0.56 cm2 overall consistent with severe MS. Also showed mild to moderate MR. - I had a long discussion with patient and daughter and patient is clear that she would not want to have any surgery for her valvular disease; therefore, will no proceed with R/LHC.  Factor V Leiden History of PE - On chronic anticoagulation with Coumadin.  Hypertension BP soft but stable. Patient asymptomatic with this. - Continue current medications:  Losartan 13m51mily and Lopressor 13mg52mce daily.  Hyperlipidemia Most recent lipid panel from 10/2020: Total Cholesterol 150, Triglycerides 59, HDL 74, LDL 64.  - Continue Lipitor 10mg 72my.  Normocytic Anemia Recent CBC on 09/10/2021 showed hemoglobin of 9.9. Baseline around 11.6 to 12.2.  - On chronic Coumadin but INR therapeutic. - CBC was recheck at Pulmonology office earlier today and hemoglobin stable at 10.2.  Disposition: Patient already has a follow-up visit with Dr. Berry Gwenlyn Founduled for 11/2021. Will keep this.   Medication Adjustments/Labs and Tests Ordered: Current medicines are reviewed at length with the patient today.  Concerns regarding medicines are outlined above.  Orders Placed This Encounter  Procedures   Brain natriuretic peptide   EKG 12-Lead   Meds ordered this encounter  Medications   furosemide (LASIX) 20 MG tablet    Sig: Take 1 tablet daily as needed only or swelling, edema, weight gain of 3 lbs overnight or 5 lb weight gain in 1 week.    Dispense:  90 tablet    Refill:  3    Patient Instructions  Medication Instructions:  Take Lasix 20 mg daily as needed for weight gain of 3 lbs over night, weight gain of 5 lb in 1 week, swelling/edema.  *If you need a refill on your cardiac medications before your next appointment, please call your pharmacy*   Lab Work: Your physician recommends that you complete lab work today.  BNP If you have labs (blood work) drawn today and your tests are completely normal, you will receive  your results only by: [place] (if you have MyChart) OR A paper copy in the mail If you have any lab test that is abnormal or we need to change your treatment, we will call you to review the results.   Testing/Procedures: NONE ordered at this time of appointment     Follow-Up: At [facility], you and your health needs are our priority.  As part of our continuing mission to provide you with exceptional heart care, we  have created designated Provider Care Teams.  These Care Teams include your primary Cardiologist (physician) and Advanced Practice Providers (APPs -  Physician Assistants and Nurse Practitioners) who all work together to provide you with the care you need, when you need it.  We recommend signing up for the patient portal called "MyChart".  Sign up information is provided on this After Visit Summary.  MyChart is used to connect with patients for Virtual Visits (Telemedicine).  Patients are able to view lab/test results, encounter notes, upcoming appointments, etc.  Non-urgent messages can be sent to your provider as well.   To learn more about what you can do with MyChart, go to [email or web address].    Your next appointment:    Keep 11/24/2021 apt  The format for your next appointment:   In Person  Provider:   Quay Burow, MD     Other Instructions   Important Information About Sugar         Signed, Eppie Gibson  09/17/2021 5:39 PM    [place]

## 2021-09-14 DIAGNOSIS — R278 Other lack of coordination: Secondary | ICD-10-CM | POA: Diagnosis not present

## 2021-09-14 DIAGNOSIS — M6281 Muscle weakness (generalized): Secondary | ICD-10-CM | POA: Diagnosis not present

## 2021-09-14 DIAGNOSIS — M810 Age-related osteoporosis without current pathological fracture: Secondary | ICD-10-CM | POA: Diagnosis not present

## 2021-09-14 DIAGNOSIS — Z9181 History of falling: Secondary | ICD-10-CM | POA: Diagnosis not present

## 2021-09-14 DIAGNOSIS — R2681 Unsteadiness on feet: Secondary | ICD-10-CM | POA: Diagnosis not present

## 2021-09-14 NOTE — Telephone Encounter (Signed)
No answer on home or cell phones.

## 2021-09-14 NOTE — Telephone Encounter (Signed)
Should be addressed by primary cardiologist

## 2021-09-15 ENCOUNTER — Telehealth: Payer: Self-pay | Admitting: Cardiovascular Disease

## 2021-09-15 ENCOUNTER — Ambulatory Visit (INDEPENDENT_AMBULATORY_CARE_PROVIDER_SITE_OTHER): Payer: Medicare Other | Admitting: Podiatry

## 2021-09-15 DIAGNOSIS — M810 Age-related osteoporosis without current pathological fracture: Secondary | ICD-10-CM | POA: Diagnosis not present

## 2021-09-15 DIAGNOSIS — D6851 Activated protein C resistance: Secondary | ICD-10-CM

## 2021-09-15 DIAGNOSIS — L84 Corns and callosities: Secondary | ICD-10-CM

## 2021-09-15 DIAGNOSIS — D689 Coagulation defect, unspecified: Secondary | ICD-10-CM | POA: Diagnosis not present

## 2021-09-15 DIAGNOSIS — R278 Other lack of coordination: Secondary | ICD-10-CM | POA: Diagnosis not present

## 2021-09-15 DIAGNOSIS — M6281 Muscle weakness (generalized): Secondary | ICD-10-CM | POA: Diagnosis not present

## 2021-09-15 DIAGNOSIS — M79676 Pain in unspecified toe(s): Secondary | ICD-10-CM | POA: Diagnosis not present

## 2021-09-15 DIAGNOSIS — R2681 Unsteadiness on feet: Secondary | ICD-10-CM | POA: Diagnosis not present

## 2021-09-15 DIAGNOSIS — Z9181 History of falling: Secondary | ICD-10-CM | POA: Diagnosis not present

## 2021-09-15 DIAGNOSIS — B351 Tinea unguium: Secondary | ICD-10-CM | POA: Diagnosis not present

## 2021-09-15 NOTE — Telephone Encounter (Signed)
Returned pt's call concerning recent labs. Pt was concerned about recent elevated BNP. I made pt aware that this would be addressed by her primary cardiologist. I also reminded pt that she has an appt this Friday, 09/17/21 with Sarajane Jews. Pt verbalized understanding and appreciated the returned phone call.

## 2021-09-15 NOTE — Telephone Encounter (Signed)
Patient called stating she had a INR last week Thursday or Friday and it was good, but then she had blood done that came bad with different news.  She would like to speak to Legrand Como about it.

## 2021-09-15 NOTE — Telephone Encounter (Signed)
Pt has office visit schedule with Sande Rives, [name] for 7/28 at 2:45pm. Will follow recommendations per this visit.

## 2021-09-17 ENCOUNTER — Other Ambulatory Visit (INDEPENDENT_AMBULATORY_CARE_PROVIDER_SITE_OTHER): Payer: Medicare Other

## 2021-09-17 ENCOUNTER — Encounter: Payer: Self-pay | Admitting: Student

## 2021-09-17 ENCOUNTER — Ambulatory Visit (INDEPENDENT_AMBULATORY_CARE_PROVIDER_SITE_OTHER): Payer: Medicare Other | Admitting: Student

## 2021-09-17 VITALS — BP 98/46 | HR 73 | Ht 62.0 in | Wt 120.0 lb

## 2021-09-17 DIAGNOSIS — I50810 Right heart failure, unspecified: Secondary | ICD-10-CM

## 2021-09-17 DIAGNOSIS — D649 Anemia, unspecified: Secondary | ICD-10-CM | POA: Diagnosis not present

## 2021-09-17 DIAGNOSIS — E785 Hyperlipidemia, unspecified: Secondary | ICD-10-CM | POA: Diagnosis not present

## 2021-09-17 DIAGNOSIS — I050 Rheumatic mitral stenosis: Secondary | ICD-10-CM | POA: Diagnosis not present

## 2021-09-17 DIAGNOSIS — I5032 Chronic diastolic (congestive) heart failure: Secondary | ICD-10-CM

## 2021-09-17 DIAGNOSIS — I350 Nonrheumatic aortic (valve) stenosis: Secondary | ICD-10-CM | POA: Diagnosis not present

## 2021-09-17 DIAGNOSIS — D682 Hereditary deficiency of other clotting factors: Secondary | ICD-10-CM

## 2021-09-17 DIAGNOSIS — Z86711 Personal history of pulmonary embolism: Secondary | ICD-10-CM

## 2021-09-17 DIAGNOSIS — I5189 Other ill-defined heart diseases: Secondary | ICD-10-CM | POA: Diagnosis not present

## 2021-09-17 DIAGNOSIS — I10 Essential (primary) hypertension: Secondary | ICD-10-CM | POA: Diagnosis not present

## 2021-09-17 LAB — BASIC METABOLIC PANEL WITH GFR
BUN: 28 mg/dL — ABNORMAL HIGH (ref 6–23)
CO2: 25 meq/L (ref 19–32)
Calcium: 8.7 mg/dL (ref 8.4–10.5)
Chloride: 102 meq/L (ref 96–112)
Creatinine, Ser: 0.79 mg/dL (ref 0.40–1.20)
GFR: 68.09 mL/min (ref >60.00)
Glucose, Bld: 93 mg/dL (ref 70–99)
Potassium: 4.1 meq/L (ref 3.5–5.1)
Sodium: 134 meq/L — ABNORMAL LOW (ref 135–145)

## 2021-09-17 LAB — CBC WITH DIFFERENTIAL/PLATELET
Basophils Absolute: 0 K/uL (ref 0.0–0.1)
Basophils Relative: 0.6 % (ref 0.0–3.0)
Eosinophils Absolute: 0.3 K/uL (ref 0.0–0.7)
Eosinophils Relative: 5.1 % — ABNORMAL HIGH (ref 0.0–5.0)
HCT: 30.7 % — ABNORMAL LOW (ref 36.0–46.0)
Hemoglobin: 10.2 g/dL — ABNORMAL LOW (ref 12.0–15.0)
Lymphocytes Relative: 11.6 % — ABNORMAL LOW (ref 12.0–46.0)
Lymphs Abs: 0.8 K/uL (ref 0.7–4.0)
MCHC: 33.3 g/dL (ref 30.0–36.0)
MCV: 94 fl (ref 78.0–100.0)
Monocytes Absolute: 0.7 K/uL (ref 0.1–1.0)
Monocytes Relative: 10.2 % (ref 3.0–12.0)
Neutro Abs: 4.7 K/uL (ref 1.4–7.7)
Neutrophils Relative %: 72.5 % (ref 43.0–77.0)
Platelets: 173 K/uL (ref 150.0–400.0)
RBC: 3.26 Mil/uL — ABNORMAL LOW (ref 3.87–5.11)
RDW: 13.7 % (ref 11.5–15.5)
WBC: 6.5 K/uL (ref 4.0–10.5)

## 2021-09-17 MED ORDER — FUROSEMIDE 20 MG PO TABS: ORAL_TABLET | ORAL | 3 refills | Status: DC

## 2021-09-17 NOTE — Telephone Encounter (Signed)
Patient has an appointment 09/17/21

## 2021-09-17 NOTE — Patient Instructions (Signed)
Medication Instructions:  Take Lasix 20 mg daily as needed for weight gain of 3 lbs over night, weight gain of 5 lb in 1 week, swelling/edema.  *If you need a refill on your cardiac medications before your next appointment, please call your pharmacy*   Lab Work: Your physician recommends that you complete lab work today.  BNP If you have labs (blood work) drawn today and your tests are completely normal, you will receive your results only by: MyChart Message (if you have MyChart) OR A paper copy in the mail If you have any lab test that is abnormal or we need to change your treatment, we will call you to review the results.   Testing/Procedures: NONE ordered at this time of appointment     Follow-Up: At [facility], you and your health needs are our priority.  As part of our continuing mission to provide you with exceptional heart care, we have created designated Provider Care Teams.  These Care Teams include your primary Cardiologist (physician) and Advanced Practice Providers (APPs -  Physician Assistants and Nurse Practitioners) who all work together to provide you with the care you need, when you need it.  We recommend signing up for the patient portal called "MyChart".  Sign up information is provided on this After Visit Summary.  MyChart is used to connect with patients for Virtual Visits (Telemedicine).  Patients are able to view lab/test results, encounter notes, upcoming appointments, etc.  Non-urgent messages can be sent to your provider as well.   To learn more about what you can do with MyChart, go to [email or web address].    Your next appointment:    Keep 11/24/2021 apt  The format for your next appointment:   In Person  Provider:   Quay Burow, MD     Other Instructions   Important Information About Sugar

## 2021-09-18 LAB — BRAIN NATRIURETIC PEPTIDE: BNP: 373.5 pg/mL — ABNORMAL HIGH (ref 0.0–100.0)

## 2021-09-20 ENCOUNTER — Encounter: Payer: Self-pay | Admitting: Podiatry

## 2021-09-20 DIAGNOSIS — M6281 Muscle weakness (generalized): Secondary | ICD-10-CM | POA: Diagnosis not present

## 2021-09-20 DIAGNOSIS — Z9181 History of falling: Secondary | ICD-10-CM | POA: Diagnosis not present

## 2021-09-20 DIAGNOSIS — M810 Age-related osteoporosis without current pathological fracture: Secondary | ICD-10-CM | POA: Diagnosis not present

## 2021-09-20 DIAGNOSIS — R278 Other lack of coordination: Secondary | ICD-10-CM | POA: Diagnosis not present

## 2021-09-20 DIAGNOSIS — R2681 Unsteadiness on feet: Secondary | ICD-10-CM | POA: Diagnosis not present

## 2021-09-20 NOTE — Progress Notes (Signed)
  Subjective:  Patient ID: Robin Harrell, female    DOB: [birth year],  MRN: [id number]  RIA REDCAY presents to clinic today for at risk foot care with h/o clotting disorder and corn(s) left lower extremity, callus(es) left lower extremity and painful mycotic nails.  Pain interferes with ambulation. Aggravating factors include wearing enclosed shoe gear. Painful toenails interfere with ambulation. Aggravating factors include wearing enclosed shoe gear. Pain is relieved with periodic professional debridement. Painful corns and calluses are aggravated when weightbearing with and without shoegear. Pain is relieved with periodic professional debridement.  Patient is accompanied by her caregiver on today's visit. They voice no new pedal concerns on today's visit.  PCP is [name], MD , and last visit was  Jul 14, 2021  Allergies  Allergen Reactions   Azithromycin Nausea Only   Codeine Nausea Only   Erythromycin Nausea And Vomiting    Review of Systems: Negative except as noted in the HPI.  Objective: No changes noted in today's physical examination. Constitutional Robin Harrell is a pleasant 88 y.o. Caucasian female, WD, WN in NAD. AAO x 3.   Vascular CFT <3 seconds b/l LE. Palpable DP/PT pulses b/l LE. Digital hair absent b/l. Skin temperature gradient WNL b/l. No pain with calf compression b/l. Trace edema BLE. No cyanosis or clubbing noted b/l LE. Varicosities present b/l. No cyanosis or clubbing noted.  Neurologic Normal speech. Oriented to person, place, and time. Protective sensation intact 5/5 intact bilaterally with 10g monofilament b/l. Vibratory sensation intact b/l.  Dermatologic Pedal integument with normal turgor, texture and tone b/l LE. Leg wrap RLE clean, dry and intact. No interdigital macerations b/l. Toenails 1-5 b/l elongated, thickened, discolored with subungual debris. +Tenderness with dorsal palpation of nailplates. Hyperkeratotic lesion(s) noted distal tip  L 4th toe. Preulcerative lesion submetatarsal head 1 left foot. No erythema, no edema, no drainage, no flocculence noted.    Orthopedic: Normal muscle strength 5/5 to all lower extremity muscle groups bilaterally. No pain, crepitus or joint limitation noted with ROM b/l LE. 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 Patient ambulates with rollator assistance.   Radiographs: None  Assessment/Plan: 1. Pain due to onychomycosis of toenail   2. Corns   3. Pre-ulcerative calluses   4. Factor V Leiden ([place])   5. Coagulation defect ([place])     -Examined patient. -Mycotic toenails 1-5 bilaterally were debrided in length and girth with sterile nail nippers and dremel without incident. -Corn(s) distal tip of left 4th toe and callus(es) submet head 1 left foot were pared utilizing sterile scalpel blade without incident. Total number debrided =2. -Patient/POA to call should there be question/concern in the interim.   Return in about 11 weeks (around 12/01/2021).  Marzetta Board, DPM

## 2021-09-21 ENCOUNTER — Other Ambulatory Visit: Payer: Self-pay

## 2021-09-21 MED ORDER — FUROSEMIDE 20 MG PO TABS: ORAL_TABLET | ORAL | 0 refills | Status: DC

## 2021-09-22 ENCOUNTER — Telehealth: Payer: Self-pay | Admitting: Student

## 2021-09-22 DIAGNOSIS — M6281 Muscle weakness (generalized): Secondary | ICD-10-CM | POA: Diagnosis not present

## 2021-09-22 DIAGNOSIS — R278 Other lack of coordination: Secondary | ICD-10-CM | POA: Diagnosis not present

## 2021-09-22 NOTE — Telephone Encounter (Signed)
Lottie Mussel from [employer] returned call.

## 2021-09-22 NOTE — Telephone Encounter (Signed)
Robin Harrell returning a call

## 2021-09-22 NOTE — Telephone Encounter (Signed)
Attempted to contact, LVM to return call.  Left call back number.

## 2021-09-22 NOTE — Telephone Encounter (Signed)
Attempted to call back.  LVM to call back Left call back number.

## 2021-09-23 ENCOUNTER — Other Ambulatory Visit: Payer: Self-pay

## 2021-09-23 DIAGNOSIS — I10 Essential (primary) hypertension: Secondary | ICD-10-CM

## 2021-09-23 DIAGNOSIS — I5032 Chronic diastolic (congestive) heart failure: Secondary | ICD-10-CM

## 2021-09-27 DIAGNOSIS — R278 Other lack of coordination: Secondary | ICD-10-CM | POA: Diagnosis not present

## 2021-09-27 DIAGNOSIS — M6281 Muscle weakness (generalized): Secondary | ICD-10-CM | POA: Diagnosis not present

## 2021-09-29 DIAGNOSIS — R278 Other lack of coordination: Secondary | ICD-10-CM | POA: Diagnosis not present

## 2021-09-29 DIAGNOSIS — M6281 Muscle weakness (generalized): Secondary | ICD-10-CM | POA: Diagnosis not present

## 2021-10-04 ENCOUNTER — Telehealth: Payer: Self-pay | Admitting: Cardiovascular Disease

## 2021-10-04 NOTE — Telephone Encounter (Signed)
OTC allergy med recommendations would include 2nd generation antihistamine (looks like she already has Xyzal on her med list) or flonase nasal spray which she also has on her med list already. Recommend she use these if she hasn't already today. Otherwise could try saline nasal spray or Afrin if she has congestion, although Afrin should only be used for 3 days max to avoid rebound congestion.

## 2021-10-04 NOTE — Telephone Encounter (Signed)
Attempted to call patient, left message for patient to call back to office.   

## 2021-10-04 NOTE — Telephone Encounter (Signed)
Patient is calling looking for recommendations regarding her allergies she reports they are very bad today and she is on several over the counter medications for it. Please advise.

## 2021-10-05 NOTE — Telephone Encounter (Signed)
Left message to return call 

## 2021-10-05 NOTE — Telephone Encounter (Signed)
Pt is returning a call  

## 2021-10-05 NOTE — Telephone Encounter (Signed)
Called patient, LVM to call back.  Left call back number.   

## 2021-10-06 DIAGNOSIS — M6281 Muscle weakness (generalized): Secondary | ICD-10-CM | POA: Diagnosis not present

## 2021-10-06 DIAGNOSIS — R278 Other lack of coordination: Secondary | ICD-10-CM | POA: Diagnosis not present

## 2021-10-06 NOTE — Telephone Encounter (Signed)
Left message for the patient to call back if anything further was needed.

## 2021-10-07 DIAGNOSIS — R278 Other lack of coordination: Secondary | ICD-10-CM | POA: Diagnosis not present

## 2021-10-07 DIAGNOSIS — M6281 Muscle weakness (generalized): Secondary | ICD-10-CM | POA: Diagnosis not present

## 2021-10-11 ENCOUNTER — Ambulatory Visit (INDEPENDENT_AMBULATORY_CARE_PROVIDER_SITE_OTHER): Payer: Medicare Other | Admitting: Pulmonary Disease

## 2021-10-11 ENCOUNTER — Encounter: Payer: Self-pay | Admitting: Pulmonary Disease

## 2021-10-11 VITALS — BP 128/76 | HR 101 | Temp 97.5°F | Ht 63.0 in | Wt 120.2 lb

## 2021-10-11 DIAGNOSIS — J310 Chronic rhinitis: Secondary | ICD-10-CM | POA: Diagnosis not present

## 2021-10-11 NOTE — Progress Notes (Signed)
Robin Harrell    [id number]    December 01, 1933  Primary Care Physician:Andy, Karie Fetch, MD  Referring Physician: [name], [address] [place],  [place] [address]  Chief complaint: Acute visit for sinusitis  HPI: 88 y.o. who  has a past medical history of Allergic rhinitis, Cough, Difficulty in swallowing, DVT (deep venous thrombosis) ([place]), Dyslipidemia, Factor V Leiden deficiency, GERD (gastroesophageal reflux disease), History of nuclear stress test (12/28/2007), HTN (hypertension), PND (post-nasal drip), Pneumonia due to COVID-19 virus (02/26/2019), Pulmonary embolism ([place]), PVC's (premature ventricular contractions), and Thyroid disease.   She is here as an acute visit for flareup of chronic sinusitis with nasal congestion, postnasal drip given by allergies.  She also has cough with clear phlegm.  Dyspnea on exertion is stable.  Outpatient Encounter Medications as of 10/11/2021  Medication Sig   acetaminophen (TYLENOL) 500 MG tablet Take 1,000 mg by mouth every 6 (six) hours as needed for moderate pain.   atorvastatin (LIPITOR) 10 MG tablet TAKE 1 TABLET (10 MG TOTAL) BY MOUTH DAILY AT 6 PM.   brimonidine (ALPHAGAN) 0.2 % ophthalmic solution Place 1 drop into both eyes 2 (two) times daily.   Calcium-Phosphorus-Vitamin D (CALCIUM/D3 ADULT GUMMIES) 200-96.6-200 MG-MG-UNIT CHEW Chew 2 tablets by mouth daily.   fluticasone (FLONASE) 50 MCG/ACT nasal spray SPRAY 2 SPRAYS INTO EACH NOSTRIL EVERY DAY (Patient taking differently: Place 2 sprays into both nostrils daily.)   ipratropium (ATROVENT) 0.03 % nasal spray PLACE 2 SPRAYS INTO BOTH NOSTRILS 3 (THREE) TIMES DAILY AS NEEDED FOR RHINITIS.   latanoprost (XALATAN) 0.005 % ophthalmic solution Place 1 drop into both eyes at bedtime.    levocetirizine (XYZAL) 5 MG tablet TAKE 1 TABLET BY MOUTH EVERY DAY IN THE EVENING   levothyroxine (SYNTHROID) 25 MCG tablet Take 1 tablet (25 mcg total) by mouth daily before breakfast.    losartan (COZAAR) 25 MG tablet Take 1 tablet (25 mg total) by mouth daily.   metoprolol tartrate (LOPRESSOR) 25 MG tablet Take 1 tablet (25 mg total) by mouth 2 (two) times daily.   multivitamin-iron-minerals-folic acid (CENTRUM) chewable tablet Chew 1 tablet by mouth daily.   warfarin (COUMADIN) 7.5 MG tablet TAKE 1/2 TO 1 TABLET BY MOUTH DAILY AS DIRECTED (Patient taking differently: Take 7.5 mg by mouth daily. 1/2 tab M,W, F and 1 tablet on T,Th,S,Su)   furosemide (LASIX) 20 MG tablet Take 1 tablet (20 mg total) by mouth daily for 5 days. (Patient taking differently: Take 20 mg by mouth daily as needed for edema or fluid.)   furosemide (LASIX) 20 MG tablet Take '20mg'$  x3 days then stop and take PRN (Patient not taking: Reported on 10/11/2021)   No facility-administered encounter medications on file as of 10/11/2021.    Allergies as of 10/11/2021 - Review Complete 10/11/2021  Allergen Reaction Noted   Azithromycin Nausea Only 02/27/2019   Codeine Nausea Only    Erythromycin Nausea And Vomiting     Past Medical History:  Diagnosis Date   Allergic rhinitis    Cough    Difficulty in swallowing    w/ occasional aspiration   DVT (deep venous thrombosis) (HCC)    Dyslipidemia    Factor V Leiden deficiency    lifelong coumadin   GERD (gastroesophageal reflux disease)    History of nuclear stress test 12/28/2007   lexiscan; low risk    HTN (hypertension)    PND (post-nasal drip)    Pneumonia due  to COVID-19 virus 02/26/2019   02/25/2019-SARS-CoV-2-positive Hospitlized in [name]/2021 - [facility] -received IV steroids and remdesivir, did not appear to require mechanical ventilation   Pulmonary embolism (HCC)    PVC's (premature ventricular contractions)    Thyroid disease     Past Surgical History:  Procedure Laterality Date   APPENDECTOMY     BUNIONECTOMY     CATARACT EXTRACTION W/ INTRAOCULAR LENS IMPLANT Right 11/02/2013   CHOLECYSTECTOMY  1973   ESOPHAGEAL DILATION     HAMMER TOE SURGERY      history of sleep study  12/27/2007   AHI during total sleep 0.9/hr and REM 1.5/hr; RDI during total sleep6.0/hr and REM 6.1/hr   REPLACEMENT TOTAL KNEE  01/11/2002   right   TRANSTHORACIC ECHOCARDIOGRAM  12/28/2007   borderline conc LVH with normal systolic function; MV mod thickened with mild MVP & mild MR    Family History  Problem Relation Age of Onset   Lung cancer Father    Cirrhosis Father    Coronary artery disease Maternal Grandfather        MI   Throat cancer Maternal Grandfather    Heart disease Paternal Grandmother    Coronary artery disease Paternal Grandfather        MI   Throat cancer Paternal Grandfather    Allergic rhinitis Sister    Skin cancer Brother    Breast cancer Daughter 58    Social History   Socioeconomic History   Marital status: Married    Spouse name: Not on file   Number of children: 4   Years of education: 13.5   Highest education level: Not on file  Occupational History   Occupation: Network engineer - retired  Tobacco Use   Smoking status: Former    Years: 1.00    Types: Cigarettes    Quit date: 02/21/1958    Years since quitting: 63.6   Smokeless tobacco: Never  Vaping Use   Vaping Use: Never used  Substance and Sexual Activity   Alcohol use: Yes    Comment: rareley one glass of wine   Drug use: No   Sexual activity: Never  Other Topics Concern   Not on file  Social History Narrative   Not on file   Social Determinants of Health   Financial Resource Strain: Low Risk  (12/25/2020)   Overall Financial Resource Strain (CARDIA)    Difficulty of Paying Living Expenses: Not hard at all  Food Insecurity: No Food Insecurity (12/25/2020)   Hunger Vital Sign    Worried About Estate manager/land agent of Food in the Last Year: Never true    Ran Out of Food in the Last Year: Never true  Transportation Needs: No Transportation Needs (12/25/2020)   PRAPARE - Hydrologist (Medical): No    Lack of Transportation (Non-Medical): No   Physical Activity: Inactive (12/25/2020)   Exercise Vital Sign    Days of Exercise per Week: 0 days    Minutes of Exercise per Session: 0 min  Stress: Stress Concern Present (12/25/2020)   [place]    Feeling of Stress : To some extent  Social Connections: Moderately Integrated (12/25/2020)   Social Connection and Isolation Panel [NHANES]    Frequency of Communication with Friends and Family: More than three times a week    Frequency of Social Gatherings with Friends and Family: More than three times a week    Attends Religious Services: More than 4  times per year    Active Member of Clubs or Organizations: No    Attends Archivist Meetings: Never    Marital Status: Married  Human resources officer Violence: Not At Risk (12/25/2020)   Humiliation, Afraid, Rape, and Kick questionnaire    Fear of Current or Ex-Partner: No    Emotionally Abused: No    Physically Abused: No    Sexually Abused: No    Review of systems: Review of Systems  Constitutional: Negative for fever and chills.  HENT: Negative.   Eyes: Negative for blurred vision.  Respiratory: as per HPI  Cardiovascular: Negative for chest pain and palpitations.  Gastrointestinal: Negative for vomiting, diarrhea, blood per rectum. Genitourinary: Negative for dysuria, urgency, frequency and hematuria.  Musculoskeletal: Negative for myalgias, back pain and joint pain.  Skin: Negative for itching and rash.  Neurological: Negative for dizziness, tremors, focal weakness, seizures and loss of consciousness.  Endo/Heme/Allergies: Negative for environmental allergies.  Psychiatric/Behavioral: Negative for depression, suicidal ideas and hallucinations.  All other systems reviewed and are negative.  Physical Exam: Blood pressure 128/76, pulse (!) 101, temperature (!) 97.5 F (36.4 C), temperature source Oral, height '5\' 3"'$  (1.6 m), weight 120 lb 3.2 oz (54.5 kg),  SpO2 97 %. Gen:      No acute distress HEENT:  EOMI, sclera anicteric Neck:     No masses; no thyromegaly Lungs:    Clear to auscultation bilaterally; normal respiratory effort CV:         Regular rate and rhythm; no murmurs Abd:      + bowel sounds; soft, non-tender; no palpable masses, no distension Ext:    No edema; adequate peripheral perfusion Skin:      Warm and dry; no rash Neuro: alert and oriented x 3 Psych: normal mood and affect  Data Reviewed: Imaging: Chest x-ray 09/09/2021-slight increase in small left effusion, no acute airspace disease.  I have reviewed the images personally.  Assessment:  Acute rhinosinusitis due to allergies Continue ipratropium nasal spray.  She is not using the Flonase nasal spray and I encouraged her to resume it Prior chlorpheniramine over-the-counter instruments as all Delsym over-the-counter for chronic cough She is requesting a referral to allergy as this has been a recurrent issue  Plan/Recommendations: Ipratropium, Flonase Chlorpheniramine Calcium Referral to allergy  Marshell Garfinkel MD [facility] 10/11/2021, 9:21 AM  CC: [name], MD

## 2021-10-11 NOTE — Patient Instructions (Addendum)
Sorry you are not feeling well Continue the ipratropium nasal spray.  Start using the fluticasone nasal spray. You can use over-the-counter medication called chlorpheniramine which is an antiallergy medication We will make a referral to allergy for chronic sinusitis.  Follow-up as scheduled with Dr. Lamonte Sakai

## 2021-10-11 NOTE — Addendum Note (Signed)
Addended by: Elton Sin on: 10/11/2021 09:37 AM   Modules accepted: Orders

## 2021-10-12 DIAGNOSIS — R278 Other lack of coordination: Secondary | ICD-10-CM | POA: Diagnosis not present

## 2021-10-12 DIAGNOSIS — M6281 Muscle weakness (generalized): Secondary | ICD-10-CM | POA: Diagnosis not present

## 2021-10-13 DIAGNOSIS — R278 Other lack of coordination: Secondary | ICD-10-CM | POA: Diagnosis not present

## 2021-10-13 DIAGNOSIS — M6281 Muscle weakness (generalized): Secondary | ICD-10-CM | POA: Diagnosis not present

## 2021-10-14 NOTE — Telephone Encounter (Signed)
PCC's, please advise triage once referral is made. Pt's daughter would like to know which office pt is being referred to. Thanks!

## 2021-10-18 ENCOUNTER — Ambulatory Visit: Payer: Medicare Other | Attending: Cardiology

## 2021-10-18 ENCOUNTER — Telehealth: Payer: Self-pay

## 2021-10-18 NOTE — Telephone Encounter (Signed)
Lpmtcb and reschedule INR. 

## 2021-10-19 ENCOUNTER — Ambulatory Visit (INDEPENDENT_AMBULATORY_CARE_PROVIDER_SITE_OTHER)
Admission: RE | Admit: 2021-10-19 | Discharge: 2021-10-19 | Disposition: A | Discharge status: Home / Self Care | Payer: Medicare Other | Source: Ambulatory Visit | Attending: Family Medicine | Admitting: Family Medicine

## 2021-10-19 ENCOUNTER — Encounter: Payer: Self-pay | Admitting: Family Medicine

## 2021-10-19 ENCOUNTER — Encounter: Payer: Self-pay | Admitting: Allergy

## 2021-10-19 ENCOUNTER — Encounter: Payer: Self-pay | Admitting: Cardiovascular Disease

## 2021-10-19 ENCOUNTER — Ambulatory Visit (INDEPENDENT_AMBULATORY_CARE_PROVIDER_SITE_OTHER): Payer: Medicare Other | Admitting: Family Medicine

## 2021-10-19 VITALS — BP 120/60 | HR 78 | Temp 99.1°F | Resp 28 | Ht 63.0 in | Wt 122.0 lb

## 2021-10-19 DIAGNOSIS — J9811 Atelectasis: Secondary | ICD-10-CM | POA: Diagnosis not present

## 2021-10-19 DIAGNOSIS — J189 Pneumonia, unspecified organism: Secondary | ICD-10-CM

## 2021-10-19 DIAGNOSIS — R059 Cough, unspecified: Secondary | ICD-10-CM | POA: Diagnosis not present

## 2021-10-19 DIAGNOSIS — R051 Acute cough: Secondary | ICD-10-CM | POA: Diagnosis not present

## 2021-10-19 DIAGNOSIS — J90 Pleural effusion, not elsewhere classified: Secondary | ICD-10-CM | POA: Diagnosis not present

## 2021-10-19 DIAGNOSIS — R0600 Dyspnea, unspecified: Secondary | ICD-10-CM | POA: Diagnosis not present

## 2021-10-19 LAB — POC COVID19 BINAXNOW: SARS Coronavirus 2 Ag: NEGATIVE

## 2021-10-19 MED ORDER — WARFARIN SODIUM 7.5 MG PO TABS: ORAL_TABLET | ORAL | Status: AC

## 2021-10-19 MED ORDER — LEVOFLOXACIN 500 MG PO TABS: 500.0000 mg | ORAL_TABLET | Freq: Every day | ORAL | 0 refills | Status: DC

## 2021-10-19 MED ORDER — HYDROCODONE BIT-HOMATROP MBR 5-1.5 MG/5ML PO SOLN: 2.5000 mL | Freq: Three times a day (TID) | ORAL | 0 refills | Status: DC | PRN

## 2021-10-19 NOTE — Progress Notes (Signed)
Subjective  CC:  Chief Complaint  Patient presents with   Cough    Pt has had a cough for the past 6days but the headaches comes and goes.    HPI: Robin Harrell is a 88 y.o. female who presents to the office today to address the problems listed above in the chief complaint. Robin Harrell presents due to worsening productive cough.  She saw pulmonology on August 21, I reviewed that note.  Treated for allergy exacerbation.  However, she continued to worsen.  Now with hacking thick productive cough of green mucus, increased work of breathing with exertion, malaise and hot flashes.  She has not checked her temperature but is running low-grade fever now.  She denies pleuritic chest pain, palpitations but does have a decreased appetite.  No nausea vomiting or diarrhea.  She did complete a prednisone course which was not very helpful.  She is not sleeping due to the cough.  She gets winded with walking to the cafeteria which is not typical for her.  Assessment  1. Community acquired pneumonia, unspecified laterality   2. Acute cough      Plan  Community-acquired pneumonia versus bronchitis, bacterial: Patient's respiratory status is stable but mildly abnormal.  Recommend chest x-ray.  Start Levaquin 500 daily for a week.  Monitor INR closely and decrease Coumadin dose.  See after visit summary for instructions.  She will monitor breathing status closely, if worsening, she will let me know or go to emergency room for further evaluation.  We will start hydrocodone at night, low-dose for cough, Mucinex to thin mucus. Medical decision making: 1 acute problem with unclear prognosis, moderate severity currently.  Prescription and testing done.  Close follow-up recommended.   Follow up: [place] if needed. 11/17/2021  Orders Placed This Encounter  Procedures   DG Chest 2 View   POC COVID-19   Meds ordered this encounter  Medications   warfarin (COUMADIN) 7.5 MG tablet    Sig: 1/2 tab M,W, F and 1  tablet on T,Th,S,Su   HYDROcodone bit-homatropine (HYCODAN) 5-1.5 MG/5ML syrup    Sig: Take 2.5-5 mLs by mouth every 8 (eight) hours as needed for cough.    Dispense:  120 mL    Refill:  0   levofloxacin (LEVAQUIN) 500 MG tablet    Sig: Take 1 tablet (500 mg total) by mouth daily.    Dispense:  14 tablet    Refill:  0      I reviewed the patients updated PMH, FH, and SocHx.    Patient Active Problem List   Diagnosis Date Noted   Hypothyroidism (acquired) 09/02/2014    Priority: High   Mixed hyperlipidemia 11/19/2012    Priority: High   Long term (current) use of anticoagulants 05/10/2012    Priority: High   Diastolic dysfunction 69/67/8938    Priority: High   Factor V deficiency ([place]) 02/09/2010    Priority: High   Osteoporosis 02/09/2010    Priority: High   Essential hypertension 11/15/2007    Priority: High   Chronic venous stasis dermatitis of both lower extremities 12/31/2018    Priority: Medium    Severe aortic stenosis 02/07/2017    Priority: Medium    Chronic idiopathic thrombocytopenia ([place]) 04/14/2016    Priority: Medium    Essential tremor 09/03/2015    Priority: Medium    Edema of right lower extremity 02/01/2013    Priority: Medium    Colon polyp 10/15/2012    Priority: Medium  Insomnia 10/11/2011    Priority: Medium    Osteoarthritis, multiple sites 06/30/2011    Priority: Medium    DJD (degenerative joint disease), lumbar 10/14/2009    Priority: Medium    Glaucoma 01/29/2008    Priority: Medium    History of DVT (deep vein thrombosis) 11/15/2007    Priority: Medium    GERD 11/15/2007    Priority: Medium    Chronic cough 11/15/2007    Priority: Medium    Primary osteoarthritis of both knees 11/18/2019    Priority: Low   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 01/29/2019    Priority: Low   History of recurrent UTIs 01/26/2017    Priority: Low   History of respiratory failure 11/11/2014    Priority: Low   Seasonal and perennial allergic rhinitis  11/15/2007    Priority: Low   DOE (dyspnea on exertion) 09/10/2021   Traumatic coccydynia 11/04/2020   Chronic rhinitis 09/04/2008   Upper airway cough syndrome 11/15/2007   Current Meds  Medication Sig   acetaminophen (TYLENOL) 500 MG tablet Take 1,000 mg by mouth every 6 (six) hours as needed for moderate pain.   atorvastatin (LIPITOR) 10 MG tablet TAKE 1 TABLET (10 MG TOTAL) BY MOUTH DAILY AT 6 PM.   brimonidine (ALPHAGAN) 0.2 % ophthalmic solution Place 1 drop into both eyes 2 (two) times daily.   Calcium-Phosphorus-Vitamin D (CALCIUM/D3 ADULT GUMMIES) 200-96.6-200 MG-MG-UNIT CHEW Chew 2 tablets by mouth daily.   fluticasone (FLONASE) 50 MCG/ACT nasal spray SPRAY 2 SPRAYS INTO EACH NOSTRIL EVERY DAY (Patient taking differently: Place 2 sprays into both nostrils daily.)   furosemide (LASIX) 20 MG tablet Take '20mg'$  x3 days then stop and take PRN   HYDROcodone bit-homatropine (HYCODAN) 5-1.5 MG/5ML syrup Take 2.5-5 mLs by mouth every 8 (eight) hours as needed for cough.   ipratropium (ATROVENT) 0.03 % nasal spray PLACE 2 SPRAYS INTO BOTH NOSTRILS 3 (THREE) TIMES DAILY AS NEEDED FOR RHINITIS.   latanoprost (XALATAN) 0.005 % ophthalmic solution Place 1 drop into both eyes at bedtime.    levocetirizine (XYZAL) 5 MG tablet TAKE 1 TABLET BY MOUTH EVERY DAY IN THE EVENING   levofloxacin (LEVAQUIN) 500 MG tablet Take 1 tablet (500 mg total) by mouth daily.   levothyroxine (SYNTHROID) 25 MCG tablet Take 1 tablet (25 mcg total) by mouth daily before breakfast.   losartan (COZAAR) 25 MG tablet Take 1 tablet (25 mg total) by mouth daily.   metoprolol tartrate (LOPRESSOR) 25 MG tablet Take 1 tablet (25 mg total) by mouth 2 (two) times daily.   multivitamin-iron-minerals-folic acid (CENTRUM) chewable tablet Chew 1 tablet by mouth daily.   [DISCONTINUED] warfarin (COUMADIN) 7.5 MG tablet TAKE 1/2 TO 1 TABLET BY MOUTH DAILY AS DIRECTED (Patient taking differently: Take 7.5 mg by mouth daily. 1/2 tab M,W, F  and 1 tablet on T,Th,S,Su)    Allergies: Patient is allergic to azithromycin, codeine, and erythromycin. Family History: Patient family history includes Allergic rhinitis in her sister; Breast cancer (age of onset: 53) in her daughter; Cirrhosis in her father; Coronary artery disease in her maternal grandfather and paternal grandfather; Heart disease in her paternal grandmother; Lung cancer in her father; Skin cancer in her brother; Throat cancer in her maternal grandfather and paternal grandfather. Social History:  Patient  reports that she quit smoking about 63 years ago. Her smoking use included cigarettes. She has never used smokeless tobacco. She reports current alcohol use. She reports that she does not use drugs.  Review of Systems:  Constitutional: Negative for fever malaise or anorexia Cardiovascular: negative for chest pain Respiratory: negative for SOB or persistent cough Gastrointestinal: negative for abdominal pain  Objective  Vitals: BP 120/60   Pulse 78   Temp 99.1 F (37.3 C)   Resp (!) 28   Ht '5\' 3"'$  (1.6 m)   Wt 122 lb (55.3 kg)   LMP  (LMP Unknown)   SpO2 95%   BMI 21.61 kg/m  General: no acute distress , A&Ox3, sitting in wheelchair, mild tachypnea with talking. HEENT: PEERL, conjunctiva normal, neck is supple Cardiovascular:  RRR with systolic murmur Respiratory:  Good breath sounds bilaterally, CTAB without wheezing or rales noted Skin:  Warm, no rashes    Commons side effects, risks, benefits, and alternatives for medications and treatment plan prescribed today were discussed, and the patient expressed understanding of the given instructions. Patient is instructed to call or message via MyChart if he/she has any questions or concerns regarding our treatment plan. No barriers to understanding were identified. We discussed Red Flag symptoms and signs in detail. Patient expressed understanding regarding what to do in case of urgent or emergency type symptoms.   Medication list was reconciled, printed and provided to the patient in AVS. Patient instructions and summary information was reviewed with the patient as documented in the AVS. This note was prepared with assistance of Dragon voice recognition software. Occasional wrong-word or sound-a-like substitutions may have occurred due to the inherent limitations of voice recognition software  This visit occurred during the SARS-CoV-2 public health emergency.  Safety protocols were in place, including screening questions prior to the visit, additional usage of staff PPE, and extensive cleaning of exam room while observing appropriate contact time as indicated for disinfecting solutions.

## 2021-10-19 NOTE — Patient Instructions (Addendum)
Please follow up as scheduled for your next visit with me: 11/17/2021   If you have any questions or concerns, please don't hesitate to send me a message via MyChart or call the office at [phone]. Thank you for visiting with [place] today! It's our pleasure caring for you.   Please decrease your dose of comadin to 1/2 tablet daily while on levoquin (antibiotic).   Please go to our [facility] office to get your xrays done. You can walk in M-F between 8:30am- noon or 1pm - 5pm. Tell them you are there for xrays ordered by me. They will send me the results, then I will let you know the results with instructions.   Address: [address].  The Xray department is located in the basement.

## 2021-10-20 ENCOUNTER — Telehealth: Payer: Self-pay | Admitting: *Deleted

## 2021-10-20 DIAGNOSIS — M6281 Muscle weakness (generalized): Secondary | ICD-10-CM | POA: Diagnosis not present

## 2021-10-20 DIAGNOSIS — R278 Other lack of coordination: Secondary | ICD-10-CM | POA: Diagnosis not present

## 2021-10-20 NOTE — Telephone Encounter (Signed)
Received message that pt was started on Levaquin '500mg'$  daily at her PCP office for pneumonia and that the PCP decreased her Warfarin to 1/2 pill daily while taking the Levaquin. Levaquin does interact with warfarin and causes the INR level to increase. However the PCP has decreased the warfarin dose to keep the INR from increasing. Pt's normal dose of warfarin is 1 tablet daily except for 1/2 tablet on Monday, Wednesday and Fridays.  Pt missed her appt on 10/18/21 and will need a follow up appt since on Levaquin and warfarin dose has been reduced. Spoke with Claudia Pollock stated there is no f/u with PCP so advised will set up appt at [facility] location. Gave her the direct number to call back if the caregiver cannot bring her.

## 2021-10-21 ENCOUNTER — Other Ambulatory Visit: Payer: Self-pay | Admitting: Emergency Medicine

## 2021-10-22 ENCOUNTER — Ambulatory Visit: Payer: Medicare Other | Attending: Cardiology

## 2021-10-22 DIAGNOSIS — Z7901 Long term (current) use of anticoagulants: Secondary | ICD-10-CM | POA: Diagnosis not present

## 2021-10-22 DIAGNOSIS — Z5181 Encounter for therapeutic drug level monitoring: Secondary | ICD-10-CM

## 2021-10-22 LAB — POCT INR: INR: 3.9 — AB (ref 2.0–3.0)

## 2021-10-22 NOTE — Patient Instructions (Signed)
Description   HOLD today's dose and only take 0.5 tablet ton Saturday and Sunday and then continue to take warfarin 1 tablet daily except for 1/2 a tablet on Monday, Wednesday and Fridays. Recheck INR in 1 week.  [phone]

## 2021-10-25 ENCOUNTER — Emergency Department (HOSPITAL_COMMUNITY): Payer: Medicare Other

## 2021-10-25 ENCOUNTER — Encounter (HOSPITAL_COMMUNITY): Payer: Self-pay | Admitting: Emergency Medicine

## 2021-10-25 ENCOUNTER — Ambulatory Visit (HOSPITAL_COMMUNITY)
Admission: RE | Admit: 2021-10-25 | Discharge: 2021-10-25 | Disposition: A | Discharge status: Home / Self Care | Payer: Medicare Other | Source: Ambulatory Visit | Attending: Family Medicine | Admitting: Family Medicine

## 2021-10-25 ENCOUNTER — Ambulatory Visit (HOSPITAL_COMMUNITY): Payer: Medicare Other

## 2021-10-25 ENCOUNTER — Observation Stay (HOSPITAL_COMMUNITY)
Admission: EM | Admit: 2021-10-25 | Discharge: 2021-10-27 | Disposition: A | Discharge status: Skilled Nursing Facility | Payer: Medicare Other | Attending: Internal Medicine | Admitting: Internal Medicine

## 2021-10-25 VITALS — BP 122/70 | HR 74 | Temp 97.4°F | Resp 22

## 2021-10-25 DIAGNOSIS — Z86718 Personal history of other venous thrombosis and embolism: Secondary | ICD-10-CM | POA: Diagnosis not present

## 2021-10-25 DIAGNOSIS — K219 Gastro-esophageal reflux disease without esophagitis: Secondary | ICD-10-CM | POA: Diagnosis present

## 2021-10-25 DIAGNOSIS — J90 Pleural effusion, not elsewhere classified: Secondary | ICD-10-CM | POA: Diagnosis not present

## 2021-10-25 DIAGNOSIS — Z8616 Personal history of COVID-19: Secondary | ICD-10-CM | POA: Diagnosis not present

## 2021-10-25 DIAGNOSIS — I5032 Chronic diastolic (congestive) heart failure: Secondary | ICD-10-CM | POA: Diagnosis not present

## 2021-10-25 DIAGNOSIS — Z96651 Presence of right artificial knee joint: Secondary | ICD-10-CM | POA: Insufficient documentation

## 2021-10-25 DIAGNOSIS — Z86711 Personal history of pulmonary embolism: Secondary | ICD-10-CM | POA: Diagnosis not present

## 2021-10-25 DIAGNOSIS — I10 Essential (primary) hypertension: Secondary | ICD-10-CM | POA: Diagnosis present

## 2021-10-25 DIAGNOSIS — R531 Weakness: Secondary | ICD-10-CM

## 2021-10-25 DIAGNOSIS — Z7901 Long term (current) use of anticoagulants: Secondary | ICD-10-CM | POA: Insufficient documentation

## 2021-10-25 DIAGNOSIS — E039 Hypothyroidism, unspecified: Secondary | ICD-10-CM | POA: Insufficient documentation

## 2021-10-25 DIAGNOSIS — Z79899 Other long term (current) drug therapy: Secondary | ICD-10-CM | POA: Diagnosis not present

## 2021-10-25 DIAGNOSIS — Z87891 Personal history of nicotine dependence: Secondary | ICD-10-CM | POA: Diagnosis not present

## 2021-10-25 DIAGNOSIS — R55 Syncope and collapse: Secondary | ICD-10-CM | POA: Diagnosis not present

## 2021-10-25 DIAGNOSIS — R4182 Altered mental status, unspecified: Secondary | ICD-10-CM | POA: Diagnosis not present

## 2021-10-25 DIAGNOSIS — I110 Hypertensive heart disease with heart failure: Secondary | ICD-10-CM | POA: Diagnosis not present

## 2021-10-25 DIAGNOSIS — S3993XA Unspecified injury of pelvis, initial encounter: Secondary | ICD-10-CM | POA: Diagnosis not present

## 2021-10-25 DIAGNOSIS — J189 Pneumonia, unspecified organism: Secondary | ICD-10-CM

## 2021-10-25 DIAGNOSIS — G20 Parkinson's disease: Secondary | ICD-10-CM | POA: Diagnosis not present

## 2021-10-25 DIAGNOSIS — J168 Pneumonia due to other specified infectious organisms: Secondary | ICD-10-CM | POA: Diagnosis not present

## 2021-10-25 DIAGNOSIS — E782 Mixed hyperlipidemia: Secondary | ICD-10-CM | POA: Diagnosis present

## 2021-10-25 DIAGNOSIS — D682 Hereditary deficiency of other clotting factors: Secondary | ICD-10-CM | POA: Diagnosis present

## 2021-10-25 LAB — CBC WITH DIFFERENTIAL/PLATELET
Abs Immature Granulocytes: 0.03 10*3/uL (ref 0.00–0.07)
Basophils Absolute: 0.1 10*3/uL (ref 0.0–0.1)
Basophils Relative: 1 %
Eosinophils Absolute: 0.2 10*3/uL (ref 0.0–0.5)
Eosinophils Relative: 3 %
HCT: 33 % — ABNORMAL LOW (ref 36.0–46.0)
Hemoglobin: 10.7 g/dL — ABNORMAL LOW (ref 12.0–15.0)
Immature Granulocytes: 1 %
Lymphocytes Relative: 15 %
Lymphs Abs: 0.9 10*3/uL (ref 0.7–4.0)
MCH: 30.9 pg (ref 26.0–34.0)
MCHC: 32.4 g/dL (ref 30.0–36.0)
MCV: 95.4 fL (ref 80.0–100.0)
Monocytes Absolute: 0.7 10*3/uL (ref 0.1–1.0)
Monocytes Relative: 12 %
Neutro Abs: 4.1 10*3/uL (ref 1.7–7.7)
Neutrophils Relative %: 68 %
Platelets: 209 10*3/uL (ref 150–400)
RBC: 3.46 MIL/uL — ABNORMAL LOW (ref 3.87–5.11)
RDW: 14.1 % (ref 11.5–15.5)
WBC: 5.9 10*3/uL (ref 4.0–10.5)
nRBC: 0 % (ref 0.0–0.2)

## 2021-10-25 LAB — COMPREHENSIVE METABOLIC PANEL
ALT: 22 U/L (ref 0–44)
AST: 26 U/L (ref 15–41)
Albumin: 3 g/dL — ABNORMAL LOW (ref 3.5–5.0)
Alkaline Phosphatase: 64 U/L (ref 38–126)
Anion gap: 9 (ref 5–15)
BUN: 10 mg/dL (ref 8–23)
CO2: 22 mmol/L (ref 22–32)
Calcium: 8.5 mg/dL — ABNORMAL LOW (ref 8.9–10.3)
Chloride: 102 mmol/L (ref 98–111)
Creatinine, Ser: 0.99 mg/dL (ref 0.44–1.00)
GFR, Estimated: 56 mL/min — ABNORMAL LOW (ref >60)
Glucose, Bld: 84 mg/dL (ref 70–99)
Potassium: 4.2 mmol/L (ref 3.5–5.1)
Sodium: 133 mmol/L — ABNORMAL LOW (ref 135–145)
Total Bilirubin: 0.8 mg/dL (ref 0.3–1.2)
Total Protein: 5.9 g/dL — ABNORMAL LOW (ref 6.5–8.1)

## 2021-10-25 LAB — URINALYSIS, ROUTINE W REFLEX MICROSCOPIC
Bilirubin Urine: NEGATIVE
Glucose, UA: NEGATIVE mg/dL
Hgb urine dipstick: NEGATIVE
Ketones, ur: NEGATIVE mg/dL
Leukocytes,Ua: NEGATIVE
Nitrite: NEGATIVE
Protein, ur: NEGATIVE mg/dL
Specific Gravity, Urine: 1.008 (ref 1.005–1.030)
pH: 7 (ref 5.0–8.0)

## 2021-10-25 LAB — PROTIME-INR
INR: 2.3 — ABNORMAL HIGH (ref 0.8–1.2)
Prothrombin Time: 25.4 seconds — ABNORMAL HIGH (ref 11.4–15.2)

## 2021-10-25 MED ORDER — LOSARTAN POTASSIUM 50 MG PO TABS
25.0000 mg | ORAL_TABLET | Freq: Every day | ORAL | Status: DC
  Administered 2021-10-26: 25 mg via ORAL
  Filled 2021-10-25: qty 1

## 2021-10-25 MED ORDER — ACETAMINOPHEN 650 MG RE SUPP: 650.0000 mg | Freq: Four times a day (QID) | RECTAL | Status: DC | PRN

## 2021-10-25 MED ORDER — BRIMONIDINE TARTRATE 0.2 % OP SOLN
1.0000 [drp] | Freq: Two times a day (BID) | OPHTHALMIC | Status: DC
  Administered 2021-10-25 – 2021-10-27 (×4): 1 [drp] via OPHTHALMIC
  Filled 2021-10-25: qty 5

## 2021-10-25 MED ORDER — FLUTICASONE PROPIONATE 50 MCG/ACT NA SUSP
2.0000 | Freq: Every day | NASAL | Status: DC
  Administered 2021-10-26 – 2021-10-27 (×2): 2 via NASAL
  Filled 2021-10-25: qty 16

## 2021-10-25 MED ORDER — LEVOTHYROXINE SODIUM 25 MCG PO TABS: 25.0000 ug | ORAL_TABLET | Freq: Every day | ORAL | Status: DC

## 2021-10-25 MED ORDER — HYDRALAZINE HCL 25 MG PO TABS: 25.0000 mg | ORAL_TABLET | Freq: Four times a day (QID) | ORAL | Status: DC | PRN

## 2021-10-25 MED ORDER — ACETAMINOPHEN 325 MG PO TABS: 650.0000 mg | ORAL_TABLET | Freq: Four times a day (QID) | ORAL | Status: DC | PRN

## 2021-10-25 MED ORDER — METOPROLOL TARTRATE 25 MG PO TABS
25.0000 mg | ORAL_TABLET | Freq: Two times a day (BID) | ORAL | Status: DC
  Administered 2021-10-25 – 2021-10-27 (×4): 25 mg via ORAL
  Filled 2021-10-25 (×4): qty 1

## 2021-10-25 MED ORDER — ONDANSETRON HCL 4 MG PO TABS: 4.0000 mg | ORAL_TABLET | Freq: Four times a day (QID) | ORAL | Status: DC | PRN

## 2021-10-25 MED ORDER — LATANOPROST 0.005 % OP SOLN
1.0000 [drp] | Freq: Every day | OPHTHALMIC | Status: DC
Start: 2021-10-25 — End: 2021-10-27
  Administered 2021-10-25 – 2021-10-26 (×2): 1 [drp] via OPHTHALMIC
  Filled 2021-10-25: qty 2.5

## 2021-10-25 MED ORDER — SODIUM CHLORIDE 0.9 % IV SOLN: 250.0000 mL | INTRAVENOUS | Status: DC | PRN

## 2021-10-25 MED ORDER — ONDANSETRON HCL 4 MG/2ML IJ SOLN: 4.0000 mg | Freq: Four times a day (QID) | INTRAMUSCULAR | Status: DC | PRN

## 2021-10-25 MED ORDER — ATORVASTATIN CALCIUM 10 MG PO TABS: 10.0000 mg | ORAL_TABLET | Freq: Every day | ORAL | Status: DC

## 2021-10-25 MED ORDER — LEVOTHYROXINE SODIUM 25 MCG PO TABS
25.0000 ug | ORAL_TABLET | Freq: Every day | ORAL | Status: DC
  Administered 2021-10-26 – 2021-10-27 (×2): 25 ug via ORAL
  Filled 2021-10-25 (×2): qty 1

## 2021-10-25 MED ORDER — SODIUM CHLORIDE 0.9% FLUSH: 3.0000 mL | INTRAVENOUS | Status: DC | PRN

## 2021-10-25 MED ORDER — ATORVASTATIN CALCIUM 10 MG PO TABS
10.0000 mg | ORAL_TABLET | Freq: Every day | ORAL | Status: DC
  Administered 2021-10-25 – 2021-10-26 (×2): 10 mg via ORAL
  Filled 2021-10-25 (×2): qty 1

## 2021-10-25 MED ORDER — LOSARTAN POTASSIUM 50 MG PO TABS
25.0000 mg | ORAL_TABLET | Freq: Every day | ORAL | Status: DC
  Filled 2021-10-25: qty 1

## 2021-10-25 MED ORDER — WARFARIN - PHARMACIST DOSING INPATIENT: Freq: Every day | Status: DC

## 2021-10-25 MED ORDER — SODIUM CHLORIDE 0.9 % IV SOLN
2.0000 g | Freq: Once | INTRAVENOUS | Status: AC
  Administered 2021-10-25: 2 g via INTRAVENOUS
  Filled 2021-10-25: qty 20

## 2021-10-25 MED ORDER — SODIUM CHLORIDE 0.9% FLUSH
3.0000 mL | Freq: Two times a day (BID) | INTRAVENOUS | Status: DC
  Administered 2021-10-25 – 2021-10-27 (×4): 3 mL via INTRAVENOUS

## 2021-10-25 NOTE — Progress Notes (Signed)
[place] for warfarin Indication: pulmonary embolus, DVT, and Factor V Leiden   Assessment: 42 yof with hx Factor V Leiden, DVT/PE, severe aortic stenosis, mitral stenosis on warfarin PTA presenting s/p fall. Pharmacy consulted to dose warfarin inpatient. Noted DDI - patient was on levofloxacin (completed 6 of 14 days PTA). INR therapeutic on admission at 2.3. MD holding on antibiotics inpatient for now and monitoring. Hg 10.7, plt WNL. Imaging negative for bleed.  PTA warfarin dose: 7.'5mg'$  daily except 3.'75mg'$  MWF (last dose 9/4 AM PTA)  Goal of Therapy:  INR 2-3 Monitor platelets by anticoagulation protocol: Yes   Plan:  No further warfarin tonight (already took dose today) Daily INR Monitor CBC, s/sx bleeding, DDI  Elicia Lamp, PharmD, BCPS Clinical Pharmacist 10/25/2021 6:53 PM

## 2021-10-25 NOTE — ED Triage Notes (Signed)
Per family (grand daughter) patient fell Saturday at 2 am and family reports that she has been extremely confused since then. Patient Aox2. Per family she does have issues with confusion but "it's a lot worse". Patient lives at [facility] and when she fell she was not evaluated. Staff, family and patient state she did not hit her head during this fall. Patient is on coumadin. Patient complaining of generalized abdominal pain and poor appetite.

## 2021-10-25 NOTE — ED Provider Notes (Signed)
Patient is being sent to the nearest emergency department for unwitnessed fall 2 days ago.  Lives in independent living with husband who has Parkinson's.  Patient endorses that she did not hit her head and she was not evaluated shortly after by paramedics who indicated that she did not need a hospital visit.  However patient is currently taking Coumadin, is currently on a reduced dose due increased INR at last check.   Patient is currently being treated on Levaquin 7-day course for community-acquired pneumonia that began 10/19/2021.  Has taken approximately 5-6 doses.  Per family patient has had fluctuations in mentation and has increased generalized weakness when baseline is independent.  Currently patient is in a wheelchair and is unable to stand without assistance.   Patient is being sent to the nearest emergency department to be escorted by family for evaluation of altered mental status, weakness.  Patient is currently oriented to 3.    Hans Eden, NP 10/25/21 1213

## 2021-10-25 NOTE — ED Provider Notes (Signed)
[facility] EMERGENCY DEPARTMENT Provider Note   CSN: [id number] Arrival date & time: 10/25/21  1223     History  Chief Complaint  Patient presents with   Altered Mental Status   Fall    Robin Harrell is a 88 y.o. female.   Altered Mental Status Fall   Patient has a history of DVT, hypertension, GERD, Parkinson's disease, PE, chronic Coumadin use.  Patient presented to the ED for evaluation of fall and weakness.  Patient had a fall 2 days ago.  Per family members patient has not quite been acting like herself for the last couple days.  Patient normally lives in an independent living situation.  She has had increasing weakness and confusion.  Patient has been speaking and will suddenly change topic to an unrelated nonsensical statement according to family members.  Patient in the ED states she was recently diagnosed with pneumonia.  She was started on antibiotic Levaquin.  She does admit to having trouble with increasing weakness.  She did have a fall a couple days ago.  She states she fell on the left side has some soreness but denies any serious injury.   Outpatient records reviewed and the patient did see her primary doctor and had a chest x-ray on August 29.  The reading showed possible mild right base infiltrate Home Medications Prior to Admission medications   Medication Sig Start Date End Date Taking? Authorizing Provider  acetaminophen (TYLENOL) 500 MG tablet Take 1,000 mg by mouth every 6 (six) hours as needed for moderate pain.    [provider]  atorvastatin (LIPITOR) 10 MG tablet TAKE 1 TABLET (10 MG TOTAL) BY MOUTH DAILY AT 6 PM. 04/08/21   Lorretta Harp, MD  brimonidine (ALPHAGAN) 0.2 % ophthalmic solution Place 1 drop into both eyes 2 (two) times daily. 07/06/21   [name], MD  Calcium-Phosphorus-Vitamin D (CALCIUM/D3 ADULT GUMMIES) 200-96.6-200 MG-MG-UNIT CHEW Chew 2 tablets by mouth daily.    [provider]   fluticasone (FLONASE) 50 MCG/ACT nasal spray SPRAY 2 SPRAYS INTO EACH NOSTRIL EVERY DAY 10/22/21   Mannam, Hart Robinsons, MD  furosemide (LASIX) 20 MG tablet Take '20mg'$  x3 days then stop and take PRN 09/21/21   Sande Rives E, [name]  HYDROcodone bit-homatropine (HYCODAN) 5-1.5 MG/5ML syrup Take 2.5-5 mLs by mouth every 8 (eight) hours as needed for cough. 10/19/21   [name], MD  ipratropium (ATROVENT) 0.03 % nasal spray PLACE 2 SPRAYS INTO BOTH NOSTRILS 3 (THREE) TIMES DAILY AS NEEDED FOR RHINITIS. 08/27/21   Collene Gobble, MD  latanoprost (XALATAN) 0.005 % ophthalmic solution Place 1 drop into both eyes at bedtime.  10/08/10   [provider]  levocetirizine (XYZAL) 5 MG tablet TAKE 1 TABLET BY MOUTH EVERY DAY IN THE EVENING 06/14/21   [name], MD  levofloxacin (LEVAQUIN) 500 MG tablet Take 1 tablet (500 mg total) by mouth daily. 10/19/21   [name], MD  levothyroxine (SYNTHROID) 25 MCG tablet Take 1 tablet (25 mcg total) by mouth daily before breakfast. 06/18/21   [name], MD  losartan (COZAAR) 25 MG tablet Take 1 tablet (25 mg total) by mouth daily. 09/03/21   [name], MD  metoprolol tartrate (LOPRESSOR) 25 MG tablet Take 1 tablet (25 mg total) by mouth 2 (two) times daily. 05/17/21   Lorretta Harp, MD  multivitamin-iron-minerals-folic acid (CENTRUM) chewable tablet Chew 1 tablet by mouth daily.    [provider]  warfarin (COUMADIN) 7.5 MG tablet 1/2 tab M,W, F and 1 tablet on T,Th,S,Su 10/19/21   [name], MD      Allergies    Azithromycin, Codeine, and Erythromycin    Review of Systems   Review of Systems  Physical Exam Updated Vital Signs BP (!) 146/86   Pulse 74   Temp 98.3 F (36.8 C) (Oral)   Resp 19   LMP  (LMP Unknown)   SpO2 97%  Physical Exam Vitals and nursing note reviewed.  Constitutional:      General: She is not in acute distress.    Appearance: She is well-developed.  HENT:     Head: Normocephalic and  atraumatic.     Right Ear: External ear normal.     Left Ear: External ear normal.  Eyes:     General: No scleral icterus.       Right eye: No discharge.        Left eye: No discharge.     Conjunctiva/sclera: Conjunctivae normal.  Neck:     Trachea: No tracheal deviation.  Cardiovascular:     Rate and Rhythm: Normal rate and regular rhythm.  Pulmonary:     Effort: Pulmonary effort is normal. No respiratory distress.     Breath sounds: Normal breath sounds. No stridor. No wheezing or rales.  Abdominal:     General: Bowel sounds are normal. There is no distension.     Palpations: Abdomen is soft.     Tenderness: There is no abdominal tenderness. There is no guarding or rebound.  Musculoskeletal:        General: No tenderness or deformity.     Cervical back: Neck supple.  Skin:    General: Skin is warm and dry.     Findings: No rash.  Neurological:     General: No focal deficit present.     Mental Status: She is alert.     Cranial Nerves: No cranial nerve deficit (no facial droop, extraocular movements intact, no slurred speech).     Sensory: No sensory deficit.     Motor: Weakness present. No abnormal muscle tone or seizure activity.     Coordination: Coordination normal.     Comments: Patient able to lift both arms off the bed, able to lift both legs off the bed but does require substantial after, patient required 2 person assistance to transfer from the wheelchair to the gurney  Psychiatric:        Mood and Affect: Mood normal.     ED Results / Procedures / Treatments   Labs (all labs ordered are listed, but only abnormal results are displayed) Labs Reviewed  COMPREHENSIVE METABOLIC PANEL - Abnormal; Notable for the following components:      Result Value   Sodium 133 (*)    Calcium 8.5 (*)    Total Protein 5.9 (*)    Albumin 3.0 (*)    GFR, Estimated 56 (*)    All other components within normal limits  CBC WITH DIFFERENTIAL/PLATELET - Abnormal; Notable for the  following components:   RBC 3.46 (*)    Hemoglobin 10.7 (*)    HCT 33.0 (*)    All other components within normal limits  PROTIME-INR - Abnormal; Notable for the following components:   Prothrombin Time 25.4 (*)    INR 2.3 (*)    All other components within normal limits  URINALYSIS, ROUTINE W REFLEX MICROSCOPIC    EKG None  Radiology CT Head Wo Contrast  Result Date: 10/25/2021 CLINICAL DATA:  Fall.  Altered mental status. EXAM: CT HEAD WITHOUT CONTRAST CT CERVICAL SPINE WITHOUT CONTRAST TECHNIQUE: Multidetector CT imaging of the head and cervical spine was performed following the standard protocol without intravenous contrast. Multiplanar CT image reconstructions of the cervical spine were also generated. RADIATION DOSE REDUCTION: This exam was performed according to the departmental dose-optimization program which includes automated exposure control, adjustment of the mA and/or kV according to patient size and/or use of iterative reconstruction technique. COMPARISON:  06/19/2021. FINDINGS: CT HEAD FINDINGS Brain: No evidence of acute infarction, hemorrhage, hydrocephalus, extra-axial collection or mass lesion/mass effect. Ventricular and sulcal enlargement reflecting mild diffuse atrophy. Patchy white matter hypoattenuation noted bilaterally consistent with chronic microvascular ischemic change. These findings are stable. Vascular: No hyperdense vessel or unexpected calcification. Skull: Normal. Negative for fracture or focal lesion. Sinuses/Orbits: Globes and orbits are unremarkable. Visualized sinuses are clear. Other: None. CT CERVICAL SPINE FINDINGS Alignment: Straightened cervical lordosis.  No spondylolisthesis. Skull base and vertebrae: No acute fracture. No primary bone lesion or focal pathologic process. Soft tissues and spinal canal: No prevertebral fluid or swelling. No visible canal hematoma. Disc levels: Mild loss of disc height at C2-C3. Marked loss of disc height at C3-C4, C5-C6  and C6-C7. Moderate loss of disc height at C4-C5. Disc bulging noted throughout the cervical spine. Bilateral facet degenerative changes, most evident on the left at C4-C5. No convincing disc herniation. No change. Upper chest: No acute findings. Other: None. IMPRESSION: HEAD CT 1. No acute intracranial abnormalities. CERVICAL CT 1. No fracture or acute finding. Electronically Signed   By: Lajean Manes M.D.   On: 10/25/2021 14:40   CT Cervical Spine Wo Contrast  Result Date: 10/25/2021 CLINICAL DATA:  Fall.  Altered mental status. EXAM: CT HEAD WITHOUT CONTRAST CT CERVICAL SPINE WITHOUT CONTRAST TECHNIQUE: Multidetector CT imaging of the head and cervical spine was performed following the standard protocol without intravenous contrast. Multiplanar CT image reconstructions of the cervical spine were also generated. RADIATION DOSE REDUCTION: This exam was performed according to the departmental dose-optimization program which includes automated exposure control, adjustment of the mA and/or kV according to patient size and/or use of iterative reconstruction technique. COMPARISON:  06/19/2021. FINDINGS: CT HEAD FINDINGS Brain: No evidence of acute infarction, hemorrhage, hydrocephalus, extra-axial collection or mass lesion/mass effect. Ventricular and sulcal enlargement reflecting mild diffuse atrophy. Patchy white matter hypoattenuation noted bilaterally consistent with chronic microvascular ischemic change. These findings are stable. Vascular: No hyperdense vessel or unexpected calcification. Skull: Normal. Negative for fracture or focal lesion. Sinuses/Orbits: Globes and orbits are unremarkable. Visualized sinuses are clear. Other: None. CT CERVICAL SPINE FINDINGS Alignment: Straightened cervical lordosis.  No spondylolisthesis. Skull base and vertebrae: No acute fracture. No primary bone lesion or focal pathologic process. Soft tissues and spinal canal: No prevertebral fluid or swelling. No visible canal  hematoma. Disc levels: Mild loss of disc height at C2-C3. Marked loss of disc height at C3-C4, C5-C6 and C6-C7. Moderate loss of disc height at C4-C5. Disc bulging noted throughout the cervical spine. Bilateral facet degenerative changes, most evident on the left at C4-C5. No convincing disc herniation. No change. Upper chest: No acute findings. Other: None. IMPRESSION: HEAD CT 1. No acute intracranial abnormalities. CERVICAL CT 1. No fracture or acute finding. Electronically Signed   By: Lajean Manes M.D.   On: 10/25/2021 14:40   DG Chest 2 View  Result Date: 10/25/2021 CLINICAL DATA:  Fall EXAM: CHEST - 2 VIEW COMPARISON:  10/19/2021  FINDINGS: No significant change in chest radiographs. Cardiomegaly with small, left greater than right bilateral pleural effusions and associated atelectasis or consolidation. No new airspace opacity. Disc degenerative disease of the thoracic spine. Osteopenia. IMPRESSION: No significant change in chest radiographs. Cardiomegaly with small, left greater than right bilateral pleural effusions and associated atelectasis or consolidation. No new airspace opacity. Electronically Signed   By: Delanna Ahmadi M.D.   On: 10/25/2021 14:17   DG Pelvis 1-2 Views  Result Date: 10/25/2021 CLINICAL DATA:  Trauma, fall EXAM: PELVIS - 1-2 VIEW COMPARISON:  06/19/2021 FINDINGS: No displaced fracture or dislocation is seen. Osteopenia is seen in bony structures. Degenerative changes are noted pubic symphysis and visualized lower lumbar spine. There are scattered vascular calcifications. IMPRESSION: No recent fracture or dislocation is seen. Electronically Signed   By: Elmer Picker M.D.   On: 10/25/2021 14:17    Procedures Procedures    Medications Ordered in ED Medications  cefTRIAXone (ROCEPHIN) 2 g in sodium chloride 0.9 % 100 mL IVPB (has no administration in time range)    ED Course/ Medical Decision Making/ A&P Clinical Course as of 10/29/21 0814  Mon Oct 25, 2021  1514  CBC with Differential(!) Hemoglobin stable compared to previous values [JK]  1514 Urinalysis, Routine w reflex microscopic No signs of infection [JK]  1514 Protime-INR(!) INR in the therapeutic range [JK]  1515 Comprehensive metabolic panel(!) Sodium slightly decreased but not significant change compared to previous.  Low albumin levels noted [JK]  1515 CT Head Wo Contrast Head CT and C-spine CT without acute findings [JK]  1515 DG Chest 2 View Chest x-ray unchanged compared to previous.  Cardiomegaly with small left greater than right bilateral pleural effusions, atelectasis and/or consolidation in the lower lobe [JK]  1516 COVID-19 test performed 6 days ago was negative [JK]  1540 Urinalysis, Routine w reflex microscopic [JK]  1557 Case discussed with hospitalist service regarding admission [JK]    Clinical Course User Index [JK] Dorie Rank, MD                           Medical Decision Making Problems Addressed: Pneumonia due to infectious organism, unspecified laterality, unspecified part of lung: acute illness or injury that poses a threat to life or bodily functions Weakness: acute illness or injury that poses a threat to life or bodily functions  Amount and/or Complexity of Data Reviewed Labs: ordered. Decision-making details documented in ED Course. Radiology: ordered and independent interpretation performed. Decision-making details documented in ED Course.  Risk Decision regarding hospitalization.  Patient presented to the ER for evaluation of confusion and weakness.  Patient recently diagnosed with pneumonia by her primary care doctor and started on quinolone antibiotics.  Patient normally lives alone independently.  Daughter came from [place] to stay with patient.  She has noticed increasing confusion.  Patient's had increasing difficulty walking.  Patient did require maximal assistance trying to transfer from the wheelchair to the bed.  No signs of any acute hemorrhage  on the head CT scan.  Patient is currently alert and answering questions appropriately.  No findings of encephalopathy on exam.  Patient's chest x-ray does show the possibility of atelectasis versus pneumonia.  With her increasing weakness I think she would benefit from IV antibiotics and further evaluation.  Patient does not appear to be safe for discharge with her weakness and difficulty ambulating.       Final Clinical Impression(s) / ED Diagnoses Final diagnoses:  Pneumonia due to infectious organism, unspecified laterality, unspecified part of lung  Weakness    Rx / DC Orders ED Discharge Orders     None         Dorie Rank, MD 10/29/21 [phone]

## 2021-10-25 NOTE — H&P (Signed)
TRH H&P    Patient Demographics:    Robin Harrell, is a 88 y.o. female  MRN: [id number]  DOB - 1934-01-05  Admit Date - 10/25/2021  Referring MD/NP/PA: Dr. Tomi Bamberger  Outpatient Primary MD for the patient is [name], MD  Patient coming from: Independent living facility  Chief complaint-fall   HPI:    Robin Harrell  is a 88 y.o. female, with history of DVT, hypertension, chronic HFpEF with right ventricular failure, severe aortic stenosis, severe mitral stenosis GERD, Parkinson disease, Factor V Leiden with prior history of pulmonary embolism on anticoagulation with Coumadin was brought to the ED for evaluation of fall and weakness.  Patient says that she had a fall on Saturday morning after she got up at night to answer a phone call.  Patient states that she was weak and she almost put herself down to floor as she was trying to avoid the fall.  She denies passing out.  Denies dizziness.  She was seen by her primary care physician and was started on Levaquin for pneumonia.  She already took 6 days of Levaquin.  As per family she has been feeling more weak, and there was intermittent episodes of confusion.  In the ED all the lab work is unremarkable, WBC 5.9, BNP 373.5 Patient is afebrile with normal vitals. Chest x-ray shows cardiomegaly with small bilateral pleural effusions, associated atelectasis versus consolidation.  Patient received 2 g of ceftriaxone in the ED. Patient takes Lasix intermittently for congestive heart failure.  She endorses some nausea but no vomiting Denies diarrhea Had abdominal pain 2 days ago Denies abdominal pain at this time Denies dysuria No chest pain or shortness of breath    Review of systems:    In addition to the HPI above,    All other systems reviewed and are negative.    Past History of the following :    Past Medical History:  Diagnosis Date    Allergic rhinitis    Cough    Difficulty in swallowing    w/ occasional aspiration   DVT (deep venous thrombosis) (HCC)    Dyslipidemia    Factor V Leiden deficiency    lifelong coumadin   GERD (gastroesophageal reflux disease)    History of nuclear stress test 12/28/2007   lexiscan; low risk    HTN (hypertension)    PND (post-nasal drip)    Pneumonia due to COVID-19 virus 02/26/2019   02/25/2019-SARS-CoV-2-positive Hospitlized in [name]/2021 - [place] -received IV steroids and remdesivir, did not appear to require mechanical ventilation   Pulmonary embolism (HCC)    PVC's (premature ventricular contractions)    Thyroid disease       Past Surgical History:  Procedure Laterality Date   APPENDECTOMY     BUNIONECTOMY     CATARACT EXTRACTION W/ INTRAOCULAR LENS IMPLANT Right 11/02/2013   CHOLECYSTECTOMY  1973   ESOPHAGEAL DILATION     HAMMER TOE SURGERY     history of sleep study  12/27/2007   AHI during total sleep 0.9/hr and REM 1.5/hr; RDI  during total sleep6.0/hr and REM 6.1/hr   REPLACEMENT TOTAL KNEE  01/11/2002   right   TRANSTHORACIC ECHOCARDIOGRAM  12/28/2007   borderline conc LVH with normal systolic function; MV mod thickened with mild MVP & mild MR      Social History:      Social History   Tobacco Use   Smoking status: Former    Years: 1.00    Types: Cigarettes    Quit date: 02/21/1958    Years since quitting: 63.7   Smokeless tobacco: Never  Substance Use Topics   Alcohol use: Yes    Comment: rareley one glass of wine       Family History :     Family History  Problem Relation Age of Onset   Lung cancer Father    Cirrhosis Father    Coronary artery disease Maternal Grandfather        MI   Throat cancer Maternal Grandfather    Heart disease Paternal Grandmother    Coronary artery disease Paternal Grandfather        MI   Throat cancer Paternal Grandfather    Allergic rhinitis Sister    Skin cancer Brother    Breast cancer Daughter 46      Home  Medications:   Prior to Admission medications   Medication Sig Start Date End Date Taking? Authorizing Provider  acetaminophen (TYLENOL) 500 MG tablet Take 1,000 mg by mouth every 6 (six) hours as needed for moderate pain.    [provider]  atorvastatin (LIPITOR) 10 MG tablet TAKE 1 TABLET (10 MG TOTAL) BY MOUTH DAILY AT 6 PM. 04/08/21   Lorretta Harp, MD  brimonidine (ALPHAGAN) 0.2 % ophthalmic solution Place 1 drop into both eyes 2 (two) times daily. 07/06/21   [name], MD  Calcium-Phosphorus-Vitamin D (CALCIUM/D3 ADULT GUMMIES) 200-96.6-200 MG-MG-UNIT CHEW Chew 2 tablets by mouth daily.    [provider]  fluticasone (FLONASE) 50 MCG/ACT nasal spray SPRAY 2 SPRAYS INTO EACH NOSTRIL EVERY DAY 10/22/21   Mannam, Hart Robinsons, MD  furosemide (LASIX) 20 MG tablet Take '20mg'$  x3 days then stop and take PRN 09/21/21   Sande Rives E, [name]  HYDROcodone bit-homatropine (HYCODAN) 5-1.5 MG/5ML syrup Take 2.5-5 mLs by mouth every 8 (eight) hours as needed for cough. 10/19/21   [name], MD  ipratropium (ATROVENT) 0.03 % nasal spray PLACE 2 SPRAYS INTO BOTH NOSTRILS 3 (THREE) TIMES DAILY AS NEEDED FOR RHINITIS. 08/27/21   Collene Gobble, MD  latanoprost (XALATAN) 0.005 % ophthalmic solution Place 1 drop into both eyes at bedtime.  10/08/10   [provider]  levocetirizine (XYZAL) 5 MG tablet TAKE 1 TABLET BY MOUTH EVERY DAY IN THE EVENING 06/14/21   [name], MD  levofloxacin (LEVAQUIN) 500 MG tablet Take 1 tablet (500 mg total) by mouth daily. 10/19/21   [name], MD  levothyroxine (SYNTHROID) 25 MCG tablet Take 1 tablet (25 mcg total) by mouth daily before breakfast. 06/18/21   [name], MD  losartan (COZAAR) 25 MG tablet Take 1 tablet (25 mg total) by mouth daily. 09/03/21   [name], MD  metoprolol tartrate (LOPRESSOR) 25 MG tablet Take 1 tablet (25 mg total) by mouth 2 (two) times daily. 05/17/21   Lorretta Harp, MD   multivitamin-iron-minerals-folic acid (CENTRUM) chewable tablet Chew 1 tablet by mouth daily.    [provider]  warfarin (COUMADIN) 7.5 MG tablet 1/2 tab M,W, F and 1 tablet  on T,Th,S,Su 10/19/21   [name], MD     Allergies:     Allergies  Allergen Reactions   Azithromycin Nausea Only   Codeine Nausea Only   Erythromycin Nausea And Vomiting     Physical Exam:   Vitals  Blood pressure (!) 155/83, pulse 96, temperature 98.3 F (36.8 C), temperature source Oral, resp. rate (!) 23, SpO2 93 %.  1.  General: Appears in no acute distress  2. Psychiatric: Alert, oriented x3, intact insight and judgment  3. Neurologic: Cranial nerves II through XII grossly intact, no focal deficit noted, motor strength 5/5 in all extremities  4. HEENMT:  Atraumatic normocephalic, extraocular muscles are intact  5. Respiratory : Clear to auscultation bilaterally  6. Cardiovascular : S1-S2, regular, grade 3/6 systolic murmur auscultated at the mitral and aortic area  7. Gastrointestinal:  Abdomen is soft, nontender, no organomegaly     Data Review:    CBC Recent Labs  Lab 10/25/21 1324  WBC 5.9  HGB 10.7*  HCT 33.0*  PLT 209  MCV 95.4  MCH 30.9  MCHC 32.4  RDW 14.1  LYMPHSABS 0.9  MONOABS 0.7  EOSABS 0.2  BASOSABS 0.1   ------------------------------------------------------------------------------------------------------------------  Results for orders placed or performed during the hospital encounter of 10/25/21 (from the past 48 hour(s))  Comprehensive metabolic panel     Status: Abnormal   Collection Time: 10/25/21  1:24 PM  Result Value Ref Range   Sodium 133 (L) 135 - 145 mmol/L   Potassium 4.2 3.5 - 5.1 mmol/L   Chloride 102 98 - 111 mmol/L   CO2 22 22 - 32 mmol/L   Glucose, Bld 84 70 - 99 mg/dL    Comment: Glucose reference range applies only to samples taken after fasting for at least 8 hours.   BUN 10 8 - 23 mg/dL   Creatinine, Ser 0.99  0.44 - 1.00 mg/dL   Calcium 8.5 (L) 8.9 - 10.3 mg/dL   Total Protein 5.9 (L) 6.5 - 8.1 g/dL   Albumin 3.0 (L) 3.5 - 5.0 g/dL   AST 26 15 - 41 U/L   ALT 22 0 - 44 U/L   Alkaline Phosphatase 64 38 - 126 U/L   Total Bilirubin 0.8 0.3 - 1.2 mg/dL   GFR, Estimated 56 (L) >60 mL/min    Comment: (NOTE) Calculated using the CKD-EPI Creatinine Equation (2021)    Anion gap 9 5 - 15    Comment: Performed at [facility], [place] [address] [place], [place] [address]  CBC with Differential     Status: Abnormal   Collection Time: 10/25/21  1:24 PM  Result Value Ref Range   WBC 5.9 4.0 - 10.5 K/uL   RBC 3.46 (L) 3.87 - 5.11 MIL/uL   Hemoglobin 10.7 (L) 12.0 - 15.0 g/dL   HCT 33.0 (L) 36.0 - 46.0 %   MCV 95.4 80.0 - 100.0 fL   MCH 30.9 26.0 - 34.0 pg   MCHC 32.4 30.0 - 36.0 g/dL   RDW 14.1 11.5 - 15.5 %   Platelets 209 150 - 400 K/uL   nRBC 0.0 0.0 - 0.2 %   Neutrophils Relative % 68 %   Neutro Abs 4.1 1.7 - 7.7 K/uL   Lymphocytes Relative 15 %   Lymphs Abs 0.9 0.7 - 4.0 K/uL   Monocytes Relative 12 %   Monocytes Absolute 0.7 0.1 - 1.0 K/uL   Eosinophils Relative 3 %   Eosinophils Absolute 0.2 0.0 -  0.5 K/uL   Basophils Relative 1 %   Basophils Absolute 0.1 0.0 - 0.1 K/uL   Immature Granulocytes 1 %   Abs Immature Granulocytes 0.03 0.00 - 0.07 K/uL    Comment: Performed at [facility], [place] [address] [place], [place] [address]  Protime-INR     Status: Abnormal   Collection Time: 10/25/21  1:24 PM  Result Value Ref Range   Prothrombin Time 25.4 (H) 11.4 - 15.2 seconds   INR 2.3 (H) 0.8 - 1.2    Comment: (NOTE) INR goal varies based on device and disease states. Performed at [facility], [place] [address] [place], [place] [address]   Urinalysis, Routine w reflex microscopic Urine, Clean Catch     Status: None   Collection Time: 10/25/21  1:54 PM  Result Value Ref Range   Color, Urine YELLOW YELLOW   APPearance CLEAR CLEAR   Specific Gravity, Urine 1.008 1.005 -  1.030   pH 7.0 5.0 - 8.0   Glucose, UA NEGATIVE NEGATIVE mg/dL   Hgb urine dipstick NEGATIVE NEGATIVE   Bilirubin Urine NEGATIVE NEGATIVE   Ketones, ur NEGATIVE NEGATIVE mg/dL   Protein, ur NEGATIVE NEGATIVE mg/dL   Nitrite NEGATIVE NEGATIVE   Leukocytes,Ua NEGATIVE NEGATIVE    Comment: Performed at [facility] [address], [place], [place] [address]    Chemistries  Recent Labs  Lab 10/25/21 1324  NA 133*  K 4.2  CL 102  CO2 22  GLUCOSE 84  BUN 10  CREATININE 0.99  CALCIUM 8.5*  AST 26  ALT 22  ALKPHOS 64  BILITOT 0.8   ------------------------------------------------------------------------------------------------------------------  ------------------------------------------------------------------------------------------------------------------ GFR: Estimated Creatinine Clearance: 34.4 mL/min (by C-G formula based on SCr of 0.99 mg/dL). Liver Function Tests: Recent Labs  Lab 10/25/21 1324  AST 26  ALT 22  ALKPHOS 64  BILITOT 0.8  PROT 5.9*  ALBUMIN 3.0*   No results for input(s): "LIPASE", "AMYLASE" in the last 168 hours. No results for input(s): "AMMONIA" in the last 168 hours. Coagulation Profile: Recent Labs  Lab 10/22/21 1451 10/25/21 1324  INR 3.9* 2.3*   Cardiac Enzymes: No results for input(s): "CKTOTAL", "CKMB", "CKMBINDEX", "TROPONINI" in the last 168 hours. BNP (last 3 results) Recent Labs    09/10/21 1410  PROBNP 450.0*   HbA1C: No results for input(s): "HGBA1C" in the last 72 hours. CBG: No results for input(s): "GLUCAP" in the last 168 hours. Lipid Profile: No results for input(s): "CHOL", "HDL", "LDLCALC", "TRIG", "CHOLHDL", "LDLDIRECT" in the last 72 hours. Thyroid Function Tests: No results for input(s): "TSH", "T4TOTAL", "FREET4", "T3FREE", "THYROIDAB" in the last 72 hours. Anemia Panel: No results for input(s): "VITAMINB12", "FOLATE", "FERRITIN", "TIBC", "IRON", "RETICCTPCT" in the last 72  hours.  --------------------------------------------------------------------------------------------------------------- Urine analysis:    Component Value Date/Time   COLORURINE YELLOW 10/25/2021 [place] 10/25/2021 1354   LABSPEC 1.008 10/25/2021 1354   PHURINE 7.0 10/25/2021 1354   GLUCOSEU NEGATIVE 10/25/2021 1354   GLUCOSEU NEGATIVE 12/31/2018 1152   HGBUR NEGATIVE 10/25/2021 1354   BILIRUBINUR NEGATIVE 10/25/2021 1354   BILIRUBINUR negative 01/22/2021 1138   KETONESUR NEGATIVE 10/25/2021 1354   PROTEINUR NEGATIVE 10/25/2021 1354   UROBILINOGEN 0.2 01/22/2021 1138   UROBILINOGEN 0.2 12/31/2018 1152   NITRITE NEGATIVE 10/25/2021 1354   LEUKOCYTESUR NEGATIVE 10/25/2021 1354      Imaging Results:    CT Head Wo Contrast  Result Date: 10/25/2021 CLINICAL DATA:  Fall.  Altered mental status. EXAM: CT  HEAD WITHOUT CONTRAST CT CERVICAL SPINE WITHOUT CONTRAST TECHNIQUE: Multidetector CT imaging of the head and cervical spine was performed following the standard protocol without intravenous contrast. Multiplanar CT image reconstructions of the cervical spine were also generated. RADIATION DOSE REDUCTION: This exam was performed according to the departmental dose-optimization program which includes automated exposure control, adjustment of the mA and/or kV according to patient size and/or use of iterative reconstruction technique. COMPARISON:  06/19/2021. FINDINGS: CT HEAD FINDINGS Brain: No evidence of acute infarction, hemorrhage, hydrocephalus, extra-axial collection or mass lesion/mass effect. Ventricular and sulcal enlargement reflecting mild diffuse atrophy. Patchy white matter hypoattenuation noted bilaterally consistent with chronic microvascular ischemic change. These findings are stable. Vascular: No hyperdense vessel or unexpected calcification. Skull: Normal. Negative for fracture or focal lesion. Sinuses/Orbits: Globes and orbits are unremarkable. Visualized sinuses  are clear. Other: None. CT CERVICAL SPINE FINDINGS Alignment: Straightened cervical lordosis.  No spondylolisthesis. Skull base and vertebrae: No acute fracture. No primary bone lesion or focal pathologic process. Soft tissues and spinal canal: No prevertebral fluid or swelling. No visible canal hematoma. Disc levels: Mild loss of disc height at C2-C3. Marked loss of disc height at C3-C4, C5-C6 and C6-C7. Moderate loss of disc height at C4-C5. Disc bulging noted throughout the cervical spine. Bilateral facet degenerative changes, most evident on the left at C4-C5. No convincing disc herniation. No change. Upper chest: No acute findings. Other: None. IMPRESSION: HEAD CT 1. No acute intracranial abnormalities. CERVICAL CT 1. No fracture or acute finding. Electronically Signed   By: Lajean Manes M.D.   On: 10/25/2021 14:40   CT Cervical Spine Wo Contrast  Result Date: 10/25/2021 CLINICAL DATA:  Fall.  Altered mental status. EXAM: CT HEAD WITHOUT CONTRAST CT CERVICAL SPINE WITHOUT CONTRAST TECHNIQUE: Multidetector CT imaging of the head and cervical spine was performed following the standard protocol without intravenous contrast. Multiplanar CT image reconstructions of the cervical spine were also generated. RADIATION DOSE REDUCTION: This exam was performed according to the departmental dose-optimization program which includes automated exposure control, adjustment of the mA and/or kV according to patient size and/or use of iterative reconstruction technique. COMPARISON:  06/19/2021. FINDINGS: CT HEAD FINDINGS Brain: No evidence of acute infarction, hemorrhage, hydrocephalus, extra-axial collection or mass lesion/mass effect. Ventricular and sulcal enlargement reflecting mild diffuse atrophy. Patchy white matter hypoattenuation noted bilaterally consistent with chronic microvascular ischemic change. These findings are stable. Vascular: No hyperdense vessel or unexpected calcification. Skull: Normal. Negative for  fracture or focal lesion. Sinuses/Orbits: Globes and orbits are unremarkable. Visualized sinuses are clear. Other: None. CT CERVICAL SPINE FINDINGS Alignment: Straightened cervical lordosis.  No spondylolisthesis. Skull base and vertebrae: No acute fracture. No primary bone lesion or focal pathologic process. Soft tissues and spinal canal: No prevertebral fluid or swelling. No visible canal hematoma. Disc levels: Mild loss of disc height at C2-C3. Marked loss of disc height at C3-C4, C5-C6 and C6-C7. Moderate loss of disc height at C4-C5. Disc bulging noted throughout the cervical spine. Bilateral facet degenerative changes, most evident on the left at C4-C5. No convincing disc herniation. No change. Upper chest: No acute findings. Other: None. IMPRESSION: HEAD CT 1. No acute intracranial abnormalities. CERVICAL CT 1. No fracture or acute finding. Electronically Signed   By: Lajean Manes M.D.   On: 10/25/2021 14:40   DG Chest 2 View  Result Date: 10/25/2021 CLINICAL DATA:  Fall EXAM: CHEST - 2 VIEW COMPARISON:  10/19/2021 FINDINGS: No significant change in chest radiographs. Cardiomegaly with small, left greater than  right bilateral pleural effusions and associated atelectasis or consolidation. No new airspace opacity. Disc degenerative disease of the thoracic spine. Osteopenia. IMPRESSION: No significant change in chest radiographs. Cardiomegaly with small, left greater than right bilateral pleural effusions and associated atelectasis or consolidation. No new airspace opacity. Electronically Signed   By: Delanna Ahmadi M.D.   On: 10/25/2021 14:17   DG Pelvis 1-2 Views  Result Date: 10/25/2021 CLINICAL DATA:  Trauma, fall EXAM: PELVIS - 1-2 VIEW COMPARISON:  06/19/2021 FINDINGS: No displaced fracture or dislocation is seen. Osteopenia is seen in bony structures. Degenerative changes are noted pubic symphysis and visualized lower lumbar spine. There are scattered vascular calcifications. IMPRESSION: No recent  fracture or dislocation is seen. Electronically Signed   By: Elmer Picker M.D.   On: 10/25/2021 14:17       Assessment & Plan:    Principal Problem:   Near syncope   Fall/near syncope-patient had episode of fall last Saturday, was started on Levaquin for 14 days.  Completed 6 days of Levaquin.  She was given ceftriaxone 2 g IV in the ED. all the lab work is unremarkable.  All the imaging studies are negative.  CT head, CT cervical spine were unremarkable.  We will obtain PT evaluation Questionable pneumonia-patient has been on antibiotics for past 6 days for possible pneumonia.  She has received Levaquin 500 mg daily for 6 days and also received ceftriaxone 2 g IV in the ED.  Chest x-ray is not convincing for pneumonia.  I will avoid giving her further antibiotics, without any evidence of infection.  Will monitor. History of DVT/factor V Leiden/pulmonary embolism-consult pharmacy for Coumadin dosing.  INR is therapeutic at 2.3 History of HFpEF-patient takes Lasix as needed.  Does not appear to be volume overloaded.  BNP is 373.5.  Will monitor. History of severe aortic stenosis/mitral stenosis-patient was seen by cardiology and has declined surgery for aortic stenosis as per note from 09/17/2021 Hypertension-blood pressure is mildly elevated, continue Cozaar, metoprolol Glaucoma-continue home medications Hypothyroidism-continue Synthroid.  Check TSH Hyperlipidemia-continue Lipitor    DVT Prophylaxis-  patient is in coumadin  AM Labs Ordered, also please review Full Orders  Family Communication: Admission, patients condition and plan of care including tests being ordered have been discussed with the patient and her daughter at bedside who indicate understanding and agree with the plan and Code Status.  Code Status:  Full code  Admission status: Observation  Time spent in minutes : 60 min    Jaelin Fackler S Izaya Netherton M.D

## 2021-10-25 NOTE — ED Provider Triage Note (Signed)
Emergency Medicine Provider Triage Evaluation Note  Robin Harrell , a 88 y.o. female  was evaluated in triage.  Pt complains of fall and AMS. On coumadin, fell two days ago. Pain in pelvis since. Denies hitting head but has been acting different per grand daughter. Patietn lives with husband in independent living. She has not been "making sense" on the phone, taking too long to answer questions and not answering questions specifically. PCP advised ED eval for AMS. Marland Kitchen  Review of Systems  Per HPI  Physical Exam  BP 120/83 (BP Location: Right Arm)   Pulse 73   Temp 98.3 F (36.8 C) (Oral)   Resp 16   LMP  (LMP Unknown)   SpO2 100%  Gen:   Awake, no distress   Resp:  Normal effort  MSK:   Moves extremities without difficulty  Other:  Tenderness pelvis. Orientedx3. Moves upper extremity without pain. Bilat LE edema.   Medical Decision Making  Medically screening exam initiated at 1:10 PM.  Appropriate orders placed.  Eyla Tallon Hartwell was informed that the remainder of the evaluation will be completed by another provider, this initial triage assessment does not replace that evaluation, and the importance of remaining in the ED until their evaluation is complete.     Sherrill Raring, [name] 10/25/21 1311

## 2021-10-25 NOTE — ED Triage Notes (Addendum)
Per family, Reports being on abx (on day 7 of 14) for pneumonia; family had concerns that pt seemed more confused over the past 2 days, so family traveled this weekend to be with her; noticed she was having significant weakness and confusion yesterday, which has improved some today, but concerned as pt doesn't have f/u with PCP until Wed; family also reports pt fell 2 nights ago (lives in independent living with spouse) - pt states paramedics arrived, but had no eval in medical facility - family states confusion started before fall. Pt also s/o abd pain, though improved from couple days ago. Pt answering questions appropriately.

## 2021-10-25 NOTE — ED Notes (Signed)
RN did neuro check on pt. Pt is currently axox4 and states "that she is not confused as her family thinks she is."

## 2021-10-26 ENCOUNTER — Other Ambulatory Visit: Payer: Self-pay

## 2021-10-26 ENCOUNTER — Telehealth: Payer: Self-pay | Admitting: Cardiovascular Disease

## 2021-10-26 DIAGNOSIS — R55 Syncope and collapse: Secondary | ICD-10-CM | POA: Diagnosis not present

## 2021-10-26 DIAGNOSIS — J189 Pneumonia, unspecified organism: Secondary | ICD-10-CM | POA: Diagnosis not present

## 2021-10-26 DIAGNOSIS — I5032 Chronic diastolic (congestive) heart failure: Secondary | ICD-10-CM | POA: Diagnosis not present

## 2021-10-26 DIAGNOSIS — R531 Weakness: Secondary | ICD-10-CM | POA: Diagnosis not present

## 2021-10-26 LAB — CBC
HCT: 30.6 % — ABNORMAL LOW (ref 36.0–46.0)
Hemoglobin: 10 g/dL — ABNORMAL LOW (ref 12.0–15.0)
MCH: 30.9 pg (ref 26.0–34.0)
MCHC: 32.7 g/dL (ref 30.0–36.0)
MCV: 94.4 fL (ref 80.0–100.0)
Platelets: 178 10*3/uL (ref 150–400)
RBC: 3.24 MIL/uL — ABNORMAL LOW (ref 3.87–5.11)
RDW: 13.9 % (ref 11.5–15.5)
WBC: 5.5 10*3/uL (ref 4.0–10.5)
nRBC: 0 % (ref 0.0–0.2)

## 2021-10-26 LAB — TSH: TSH: 3.217 u[IU]/mL (ref 0.350–4.500)

## 2021-10-26 LAB — PROTIME-INR
INR: 2.3 — ABNORMAL HIGH (ref 0.8–1.2)
Prothrombin Time: 24.9 seconds — ABNORMAL HIGH (ref 11.4–15.2)

## 2021-10-26 MED ORDER — SODIUM CHLORIDE 0.9 % IV BOLUS
250.0000 mL | Freq: Once | INTRAVENOUS | Status: AC
  Administered 2021-10-26: 250 mL via INTRAVENOUS

## 2021-10-26 MED ORDER — WARFARIN SODIUM 7.5 MG PO TABS
7.5000 mg | ORAL_TABLET | Freq: Once | ORAL | Status: AC
Start: 2021-10-26 — End: 2021-10-26
  Administered 2021-10-26: 7.5 mg via ORAL
  Filled 2021-10-26 (×2): qty 1

## 2021-10-26 NOTE — Evaluation (Signed)
Physical Therapy Evaluation Patient Details Name: Robin Harrell MRN: [id number] DOB: [birth year] Today's Date: 10/26/2021  History of Present Illness  Pt presented to ED on 9/4 with fall and weakness. Pt with recent diagnosis of possible PNA and was on antibiotics. PMH - DVT, PE, Factor V Leiden, HTN, CHF, Parkinsons, rt TKR.  Clinical Impression  Pt presents to PT very weak and needing assist with all mobility. Pt lives with husband who is w/c bound in [facility] independent [facility]. They have some intermittent assist that comes in. At this point I don't see pt being able to manage on her own at home. She went to SNF for rehab after prior hospitalization and did not like the facility. Feel like pt currently needs ST-SNF prior to return to independent apartment. Pt stated she would consider a different facility but would prefer to return directly to [facility] if possible.        Recommendations for follow up therapy are one component of a multi-disciplinary discharge planning process, led by the attending physician.  Recommendations may be updated based on patient status, additional functional criteria and insurance authorization.  Follow Up Recommendations Skilled nursing-short term rehab (<3 hours/day) Can patient physically be transported by private vehicle: Yes    Assistance Recommended at Discharge Frequent or constant Supervision/Assistance  Patient can return home with the following  A little help with walking and/or transfers;A little help with bathing/dressing/bathroom;Assist for transportation    Equipment Recommendations None recommended by PT  Recommendations for Other Services  OT consult    Functional Status Assessment Patient has had a recent decline in their functional status and demonstrates the ability to make significant improvements in function in a reasonable and predictable amount of time.     Precautions / Restrictions Precautions Precautions: Fall Precaution  Comments: watch O2 and HR      Mobility  Bed Mobility Overal bed mobility: Needs Assistance Bed Mobility: Supine to Sit, Sit to Supine     Supine to sit: Mod assist, HOB elevated Sit to supine: Total assist   General bed mobility comments: Assist to bring legs off of bed, elevate trunk into sitting and bring hips to edge. Total assist to return due to height of stretcher.    Transfers Overall transfer level: Needs assistance Equipment used: Rollator (4 wheels) Transfers: Sit to/from Stand Sit to Stand: Min assist           General transfer comment: Asssist to bring hips up and for balance    Ambulation/Gait Ambulation/Gait assistance: Min assist Gait Distance (Feet): 10 Feet Assistive device: Rollator (4 wheels) Gait Pattern/deviations: Step-through pattern, Decreased step length - right, Decreased step length - left, Shuffle, Trunk flexed Gait velocity: decr Gait velocity interpretation: <1.31 ft/sec, indicative of household ambulator   General Gait Details: Assist for balance and support. Pt unable to stand upright due to severe kyphosis.  Stairs            Wheelchair Mobility    Modified Rankin (Stroke Patients Only)       Balance Overall balance assessment: Needs assistance Sitting-balance support: No upper extremity supported, Feet supported Sitting balance-Leahy Scale: Fair     Standing balance support: Bilateral upper extremity supported, Reliant on assistive device for balance Standing balance-Leahy Scale: Poor Standing balance comment: walker and min guard for static standing                             Pertinent Vitals/Pain  Pain Assessment Pain Assessment: No/denies pain    Home Living Family/patient expects to be discharged to:: Other (Comment) (Independent living - [facility]) Living Arrangements: Spouse/significant other Available Help at Discharge: Available PRN/intermittently Type of Home: Independent living  facility Home Access: Level entry       Home Layout: One level Home Equipment: Conservation officer, nature (2 wheels);Transport chair Additional Comments: Husband is w/c bound. Pt has aide come in a couple of days a week.    Prior Function Prior Level of Function : Needs assist       Physical Assist : Mobility (physical) Mobility (physical):  (Pt needs to be pushed in transport chair to dining room)   Mobility Comments: Modified independent with rolling walker for short distances. Has to use transport chair to go longer distances       Hand Dominance   Dominant Hand: Right    Extremity/Trunk Assessment   Upper Extremity Assessment Upper Extremity Assessment: Defer to OT evaluation    Lower Extremity Assessment Lower Extremity Assessment: Generalized weakness    Cervical / Trunk Assessment Cervical / Trunk Assessment: Kyphotic (severe kyphosis)  Communication   Communication: No difficulties  Cognition Arousal/Alertness: Awake/alert Behavior During Therapy: WFL for tasks assessed/performed Overall Cognitive Status: Within Functional Limits for tasks assessed                                          General Comments General comments (skin integrity, edema, etc.): Pt on 2L O2 at rest with SpO2 100%. Amb on RA with SpO2 to 86%. Replaced O2 with SpO2 returning to 98%. RHR 80's. HR to 120's with amb. Returned to supine with HR going to 150's    Exercises     Assessment/Plan    PT Assessment Patient needs continued PT services  PT Problem List Decreased strength;Decreased activity tolerance;Decreased balance;Decreased mobility       PT Treatment Interventions DME instruction;Gait training;Functional mobility training;Therapeutic activities;Therapeutic exercise;Balance training;Patient/family education    PT Goals (Current goals can be found in the Care Plan section)  Acute Rehab PT Goals Patient Stated Goal: Pt wants to return directly home. PT Goal  Formulation: With patient Time For Goal Achievement: 11/09/21 Potential to Achieve Goals: Good    Frequency Min 3X/week     Co-evaluation               AM-PAC PT "6 Clicks" Mobility  Outcome Measure Help needed turning from your back to your side while in a flat bed without using bedrails?: A Little Help needed moving from lying on your back to sitting on the side of a flat bed without using bedrails?: A Lot Help needed moving to and from a bed to a chair (including a wheelchair)?: A Little Help needed standing up from a chair using your arms (e.g., wheelchair or bedside chair)?: A Little Help needed to walk in hospital room?: Total Help needed climbing 3-5 steps with a railing? : Total 6 Click Score: 13    End of Session Equipment Utilized During Treatment: Oxygen Activity Tolerance: Patient limited by fatigue Patient left: in bed;with call bell/phone within reach Nurse Communication: Mobility status PT Visit Diagnosis: Unsteadiness on feet (R26.81);Muscle weakness (generalized) (M62.81);History of falling (Z91.81)    Time: 5053-9767 PT Time Calculation (min) (ACUTE ONLY): 26 min   Charges:   PT Evaluation $PT Eval Moderate Complexity: 1 Mod PT Treatments $Gait Training: 8-22 mins  [facility] [place] 10/26/2021, 10:42 AM

## 2021-10-26 NOTE — Telephone Encounter (Signed)
Returned call to patients daughter (okay per DPR) who states that patient was seen at urgent care for confusion and weakness and states that Urgent care sent her to ER. She wants to make Dr. Gwenlyn Found aware that patient is being admitted to the hospital. Advised patients daughter I would forward message over to Dr. Gwenlyn Found to make him aware. Patients daughter verbalized understanding.

## 2021-10-26 NOTE — ED Notes (Signed)
Pt up eating breakfast. Provider at bedside.

## 2021-10-26 NOTE — Consult Note (Addendum)
Cardiology Consultation   Patient ID: Robin Harrell MRN: [id number]; DOB: 1934-02-01  Admit date: 10/25/2021 Date of Consult: 10/26/2021  PCP:  Robin Harrell, [place] [facility] Cardiologist:  Robin Burow, MD   {   Patient Profile:   Robin Harrell is a 88 y.o. female with a hx of chronic diastolic heart failure, severe aortic stenosis, severe mitral stenosis, PVCs,  chronic hypoxic respiratory failure following COVID-19 in 2021,  factor V leiden with prior PE on life-long Coumadin, hypertension, hyperlipidemia, hypothyroidism, and GERD, who is being seen 10/26/2021 for the evaluation of CHF and valvular disease at the request of Robin Harrell.  History of Present Illness:   Robin Harrell follows Robin Harrell outpatient, most recently seen APP Robin Harrell on 09/17/21 for concern of new onset of CHF. She had reported worsening DOE and leg edema. BNP was 450. Echo on 09/06/2021 showed LVEF of 65-70% with grade 3 DD, mildly enlarged RV with severely reduced systolic function, biatrial enlargement, low gradient severe aortic stenosis with mean gradient of 21 mmHg, and severe mitral stenosis with mean gradient of 9 mmHg. She was started on Lasix by pulmonology, this was recommended to continue PRN basis for worsening CHF symptoms. She was maintained on losartan and lopressor for HTN. It was felt her RV failure due to grade III DD and severe AS and Robin, but she had clear wish that she would not want to any surgery for her valvular disease. She has a remote normal stress test 2009.   She presented to ER today c/o fall and weakness. She suffered a fall on Sat due to generalized weakness and lost of balance. She was able to try to ease herself to the ground to avoid significant trauma. She did not have any chest pain, worsening of DOE, dizziness, syncope. She was recently started on Levaquin for CAP by her PCP a week ago and is still have some dry cough. Family had noted intermittent  confusion at home as well. She states she was taking Lasix as needed since she saw cardiology last, but stopped after 3 days as it was making her urinating too much. She did take some last last week. She has chronic DOE, does not think this is much worsened. She states her legs are painful but did not notice much swelling. She denied any rapid weight gain, excessive use of salt. She does not want any invasive surgery for her valvular disease, states she is old and has very limited family support and a sick husband.   Admission diagnostic showed hyponatremia 133, CBC with Hgb 10.7. INR 2.3. TSH WNL. UA unremarkable.  CXR showed L>R small bilateral effusion with associated atelectasis or consolidation. Pelvis X-ray and CTH and CT C spine showed no acute fracture. She was admitted to hospital medicine for weakness, felt in acute diastolic heart failure, cardiology is consulted today.     Past Medical History:  Diagnosis Date   Allergic rhinitis    Cough    Difficulty in swallowing    w/ occasional aspiration   DVT (deep venous thrombosis) (HCC)    Dyslipidemia    Factor V Leiden deficiency    lifelong coumadin   GERD (gastroesophageal reflux disease)    History of nuclear stress test 12/28/2007   lexiscan; low risk    HTN (hypertension)    PND (post-nasal drip)    Pneumonia due to COVID-19 virus 02/26/2019   02/25/2019-SARS-CoV-2-positive Hospitlized in [name]/2021 - [place] -received IV  steroids and remdesivir, did not appear to require mechanical ventilation   Pulmonary embolism (HCC)    PVC's (premature ventricular contractions)    Thyroid disease     Past Surgical History:  Procedure Laterality Date   APPENDECTOMY     BUNIONECTOMY     CATARACT EXTRACTION W/ INTRAOCULAR LENS IMPLANT Right 11/02/2013   CHOLECYSTECTOMY  1973   ESOPHAGEAL DILATION     HAMMER TOE SURGERY     history of sleep study  12/27/2007   AHI during total sleep 0.9/hr and REM 1.5/hr; RDI during total sleep6.0/hr and REM  6.1/hr   REPLACEMENT TOTAL KNEE  01/11/2002   right   TRANSTHORACIC ECHOCARDIOGRAM  12/28/2007   borderline conc LVH with normal systolic function; MV mod thickened with mild MVP & mild MR     Home Medications:  Prior to Admission medications   Medication Sig Start Date End Date Taking? Authorizing Provider  acetaminophen (TYLENOL) 500 MG tablet Take 1,000 mg by mouth every 6 (six) hours as needed for moderate pain.   Yes [provider]  atorvastatin (LIPITOR) 10 MG tablet TAKE 1 TABLET (10 MG TOTAL) BY MOUTH DAILY AT 6 PM. Patient taking differently: Take 10 mg by mouth daily. 04/08/21  Yes Lorretta Harp, MD  brimonidine (ALPHAGAN) 0.2 % ophthalmic solution Place 1 drop into both eyes 2 (two) times daily. 07/06/21  Yes Robin Arnt, MD  chlorpheniramine (CHLOR-TRIMETON) 4 MG tablet Take 4 mg by mouth 2 (two) times daily.   Yes [provider]  fluticasone (FLONASE) 50 MCG/ACT nasal spray SPRAY 2 SPRAYS INTO EACH NOSTRIL EVERY DAY Patient taking differently: Place 2 sprays into both nostrils daily. 10/22/21  Yes Mannam, Praveen, MD  furosemide (LASIX) 20 MG tablet Take 70m x3 days then stop and take PRN Patient taking differently: Take 20 mg by mouth daily as needed for fluid. 09/21/21  Yes GSarajane Harrell Callie E, [name]  HYDROcodone bit-homatropine (HYCODAN) 5-1.5 MG/5ML syrup Take 2.5-5 mLs by mouth every 8 (eight) hours as needed for cough. 10/19/21  Yes ALeamon Arnt MD  ipratropium (ATROVENT) 0.03 % nasal spray PLACE 2 SPRAYS INTO BOTH NOSTRILS 3 (THREE) TIMES DAILY AS NEEDED FOR RHINITIS. Patient taking differently: Place 2 sprays into both nostrils 3 (three) times daily as needed for rhinitis. 08/27/21  Yes Byrum, RRose Fillers MD  latanoprost (XALATAN) 0.005 % ophthalmic solution Place 1 drop into both eyes at bedtime.  10/08/10  Yes [provider]  levocetirizine (XYZAL) 5 MG tablet TAKE 1 TABLET BY MOUTH EVERY DAY IN THE EVENING Patient taking differently: Take 5  mg by mouth every evening. 06/14/21  Yes ALeamon Arnt MD  levofloxacin (LEVAQUIN) 500 MG tablet Take 1 tablet (500 mg total) by mouth daily. 10/19/21  Yes ALeamon Arnt MD  levothyroxine (SYNTHROID) 25 MCG tablet Take 1 tablet (25 mcg total) by mouth daily before breakfast. 06/18/21  Yes ALeamon Arnt MD  losartan (COZAAR) 25 MG tablet Take 1 tablet (25 mg total) by mouth daily. 09/03/21  Yes ALeamon Arnt MD  metoprolol tartrate (LOPRESSOR) 25 MG tablet Take 1 tablet (25 mg total) by mouth 2 (two) times daily. 05/17/21  Yes BLorretta Harp MD  warfarin (COUMADIN) 7.5 MG tablet 1/2 tab M,W, F and 1 tablet on T,Th,S,Su Patient taking differently: Take 3.75-7.5 mg by mouth See admin instructions. Take 1/2 tablet (3.75 mg) on Mon,Wed, and Fri. THEN Take 1 tablet (7.5 mg) on Tues, Thurs, Sat, and Sun 10/19/21  Yes Robin Arnt, MD  Calcium-Phosphorus-Vitamin D (CALCIUM/D3 ADULT GUMMIES) 200-96.6-200 MG-MG-UNIT CHEW Chew 2 tablets by mouth daily. Patient not taking: Reported on 10/25/2021    [provider]  multivitamin-iron-minerals-folic acid (CENTRUM) chewable tablet Chew 1 tablet by mouth daily. Patient not taking: Reported on 10/25/2021    [provider]    Inpatient Medications: Scheduled Meds:  atorvastatin  10 mg Oral q1800   brimonidine  1 drop Both Eyes BID   fluticasone  2 spray Each Nare Daily   latanoprost  1 drop Both Eyes QHS   levothyroxine  25 mcg Oral QAC breakfast   losartan  25 mg Oral Daily   metoprolol tartrate  25 mg Oral BID   sodium chloride flush  3 mL Intravenous Q12H   warfarin  7.5 mg Oral ONCE-1600   Warfarin - Pharmacist Dosing Inpatient   Does not apply q1600   Continuous Infusions:  sodium chloride     PRN Meds: sodium chloride, acetaminophen **OR** acetaminophen, hydrALAZINE, ondansetron **OR** ondansetron (ZOFRAN) IV, sodium chloride flush  Allergies:    Allergies  Allergen Reactions   Azithromycin Nausea Only   Codeine  Nausea Only   Erythromycin Nausea And Vomiting    Social History:   Social History   Socioeconomic History   Marital status: Married    Spouse name: Not on file   Number of children: 4   Years of education: 13.5   Highest education level: Not on file  Occupational History   Occupation: Network engineer - retired  Tobacco Use   Smoking status: Former    Years: 1.00    Types: Cigarettes    Quit date: 02/21/1958    Years since quitting: 63.7   Smokeless tobacco: Never  Vaping Use   Vaping Use: Never used  Substance and Sexual Activity   Alcohol use: Yes    Comment: rareley one glass of wine   Drug use: No   Sexual activity: Never  Other Topics Concern   Not on file  Social History Narrative   Not on file   Social Determinants of Health   Financial Resource Strain: Low Risk  (12/25/2020)   Overall Financial Resource Strain (CARDIA)    Difficulty of Paying Living Expenses: Not hard at all  Food Insecurity: No Food Insecurity (12/25/2020)   Hunger Vital Sign    Worried About Running Out of Food in the Last Year: Never true    [place] in the Last Year: Never true  Transportation Needs: No Transportation Needs (12/25/2020)   PRAPARE - Hydrologist (Medical): No    Lack of Transportation (Non-Medical): No  Physical Activity: Inactive (12/25/2020)   Exercise Vital Sign    Days of Exercise per Week: 0 days    Minutes of Exercise per Session: 0 min  Stress: Stress Concern Present (12/25/2020)   Harmony    Feeling of Stress : To some extent  Social Connections: Moderately Integrated (12/25/2020)   Social Connection and Isolation Panel [NHANES]    Frequency of Communication with Friends and Family: More than three times a week    Frequency of Social Gatherings with Friends and Family: More than three times a week    Attends Religious Services: More than 4 times per year    Active  Member of [employer] or Organizations: No    Attends Archivist Meetings: Never    Marital Status: Married  Intimate  Partner Violence: Not At Risk (12/25/2020)   Humiliation, Afraid, Rape, and Kick questionnaire    Fear of Current or Ex-Partner: No    Emotionally Abused: No    Physically Abused: No    Sexually Abused: No    Family History:    Family History  Problem Relation Age of Onset   Lung cancer Father    Cirrhosis Father    Coronary artery disease Maternal Grandfather        MI   Throat cancer Maternal Grandfather    Heart disease Paternal Grandmother    Coronary artery disease Paternal Grandfather        MI   Throat cancer Paternal Grandfather    Allergic rhinitis Sister    Skin cancer Brother    Breast cancer Daughter 90     ROS:  Constitutional: Denied fever, chills, malaise, night sweats Eyes: Denied vision change or loss Ears/Nose/Mouth/Throat: Denied ear ache, sore throat, sinus pain Cardiovascular: see HPI  Respiratory: DOE Gastrointestinal: Denied nausea, vomiting, abdominal pain, diarrhea Genital/Urinary: Denied dysuria, hematuria, urinary frequency/urgency Musculoskeletal: Denied muscle ache, joint pain Skin: Denied rash, wound Neuro: Denied headache, dizziness, syncope Psych: Denied history of depression/anxiety  Endocrine: Denied history of diabetes   Physical Exam/Data:   Vitals:   10/26/21 1046 10/26/21 1200 10/26/21 1300 10/26/21 1339  BP: (!) 91/49 (!) 95/54 (!) 95/24 98/65  Pulse: 91 70 80 83  Resp: 20     Temp: (!) 97.5 F (36.4 C)     TempSrc: Oral     SpO2: 99% 100% 100% 94%   No intake or output data in the 24 hours ending 10/26/21 1422    10/19/2021   11:29 AM 10/11/2021    9:10 AM 09/17/2021    2:30 PM  Last 3 Weights  Weight (lbs) 122 lb 120 lb 3.2 oz 120 lb  Weight (kg) 55.339 kg 54.522 kg 54.432 kg     There is no height or weight on file to calculate BMI.   Vitals:  Vitals:   10/26/21 1300 10/26/21 1339  BP: (!)  95/24 98/65  Pulse: 80 83  Resp:    Temp:    SpO2: 100% 94%   General Appearance: In no apparent distress, laying in bed, frail elderly  HEENT: Normocephalic, atraumatic.  Neck: Supple, trachea midline, no JVDs Cardiovascular: Regular rate and rhythm, normal S1-S2,  grade III systolic murmur RUSB Respiratory: Resting breathing unlabored, lungs sounds clear to auscultation bilaterally, no use of accessory muscles. On 2LNC, no crackles  Gastrointestinal: Bowel sounds positive, abdomen soft Extremities: Able to move all extremities in bed without difficulty, trace edema of BLE, tenderness of BLE Musculoskeletal: Normal muscle bulk and tone, kyphosis  Skin: Intact, warm, dry. No rashes  Neurologic: Alert, oriented to person, place and time. Fluent speech,no cognitive deficit Psychiatric: Normal affect. Mood is appropriate.     EKG:  The EKG was personally reviewed and demonstrates:   No EKG done this admission  Telemetry:  Telemetry was personally reviewed and demonstrates:   Sinus rhythm, occasional PVCs noted    Relevant CV Studies:  Echo from 09/06/21:   1. Compared to echo report from March 2022, valves appear more stenotic.   2. Left ventricular ejection fraction, by estimation, is 65 to 70%. The  left ventricle has normal function. The left ventricle has no regional  wall motion abnormalities. There is mild concentric left ventricular  hypertrophy. Left ventricular diastolic  parameters are consistent with Grade III diastolic dysfunction  (restrictive).  Elevated left atrial pressure.   3. Right ventricular systolic function is severely reduced. The right  ventricular size is mildly enlarged.   4. Left atrial size was severely dilated.   5. Right atrial size was mild to moderately dilated.   6. MV is thickened, calcified with restricted motion. Difficult to see  individual leaflets well. Peak and mean gradients through the valve are 27  and 9 mm Hg respectively MVA (VTI)  is 0.56 cm2 overall consistent with  severe Robin. (HR 70 bpm). Mild to  moderate mitral valve regurgitation.   7. Tricuspid valve regurgitation is moderate.   8. AV leaflets are severely thickened Peak and mean gradients through the  valve are 41 and 21 mm Hg respectively. [name] (VTI) is 0.67 cm2 .  Dimensionless index is o.21 consistent with low gradient severe AS.  Aortic valve regurgitation is not visualized.   9. Pulmonic valve regurgitation is moderate.  10. The inferior vena cava is dilated in size with <50% respiratory  variability, suggesting right atrial pressure of 15 mmHg.   Comparison(s): EF 60%, mild LVH, moderate MAC, moderate Robin mean 50mHg peak  18.533mg, moderate AS mean 19 mmHg, peak 34.5 mmHg, AI.    Laboratory Data:  High Sensitivity Troponin:  No results for input(s): "TROPONINIHS" in the last 720 hours.   Chemistry Recent Labs  Lab 10/25/21 1324  NA 133*  K 4.2  CL 102  CO2 22  GLUCOSE 84  BUN 10  CREATININE 0.99  CALCIUM 8.5*  GFRNONAA 56*  ANIONGAP 9    Recent Labs  Lab 10/25/21 1324  PROT 5.9*  ALBUMIN 3.0*  AST 26  ALT 22  ALKPHOS 64  BILITOT 0.8   Lipids No results for input(s): "CHOL", "TRIG", "HDL", "LABVLDL", "LDLCALC", "CHOLHDL" in the last 168 hours.  Hematology Recent Labs  Lab 10/25/21 1324 10/26/21 0344  WBC 5.9 5.5  RBC 3.46* 3.24*  HGB 10.7* 10.0*  HCT 33.0* 30.6*  MCV 95.4 94.4  MCH 30.9 30.9  MCHC 32.4 32.7  RDW 14.1 13.9  PLT 209 178   Thyroid  Recent Labs  Lab 10/26/21 0344  TSH 3.217    BNPNo results for input(s): "BNP", "PROBNP" in the last 168 hours.  DDimer No results for input(s): "DDIMER" in the last 168 hours.   Radiology/Studies:  CT Head Wo Contrast  Result Date: 10/25/2021 CLINICAL DATA:  Fall.  Altered mental status. EXAM: CT HEAD WITHOUT CONTRAST CT CERVICAL SPINE WITHOUT CONTRAST TECHNIQUE: Multidetector CT imaging of the head and cervical spine was performed following the standard protocol  without intravenous contrast. Multiplanar CT image reconstructions of the cervical spine were also generated. RADIATION DOSE REDUCTION: This exam was performed according to the departmental dose-optimization program which includes automated exposure control, adjustment of the mA and/or kV according to patient size and/or use of iterative reconstruction technique. COMPARISON:  06/19/2021. FINDINGS: CT HEAD FINDINGS Brain: No evidence of acute infarction, hemorrhage, hydrocephalus, extra-axial collection or mass lesion/mass effect. Ventricular and sulcal enlargement reflecting mild diffuse atrophy. Patchy white matter hypoattenuation noted bilaterally consistent with chronic microvascular ischemic change. These findings are stable. Vascular: No hyperdense vessel or unexpected calcification. Skull: Normal. Negative for fracture or focal lesion. Sinuses/Orbits: Globes and orbits are unremarkable. Visualized sinuses are clear. Other: None. CT CERVICAL SPINE FINDINGS Alignment: Straightened cervical lordosis.  No spondylolisthesis. Skull base and vertebrae: No acute fracture. No primary bone lesion or focal pathologic process. Soft tissues and spinal canal: No prevertebral fluid or swelling. No visible  canal hematoma. Disc levels: Mild loss of disc height at C2-C3. Marked loss of disc height at C3-C4, C5-C6 and C6-C7. Moderate loss of disc height at C4-C5. Disc bulging noted throughout the cervical spine. Bilateral facet degenerative changes, most evident on the left at C4-C5. No convincing disc herniation. No change. Upper chest: No acute findings. Other: None. IMPRESSION: HEAD CT 1. No acute intracranial abnormalities. CERVICAL CT 1. No fracture or acute finding. Electronically Signed   By: Lajean Manes M.D.   On: 10/25/2021 14:40   CT Cervical Spine Wo Contrast  Result Date: 10/25/2021 CLINICAL DATA:  Fall.  Altered mental status. EXAM: CT HEAD WITHOUT CONTRAST CT CERVICAL SPINE WITHOUT CONTRAST TECHNIQUE:  Multidetector CT imaging of the head and cervical spine was performed following the standard protocol without intravenous contrast. Multiplanar CT image reconstructions of the cervical spine were also generated. RADIATION DOSE REDUCTION: This exam was performed according to the departmental dose-optimization program which includes automated exposure control, adjustment of the mA and/or kV according to patient size and/or use of iterative reconstruction technique. COMPARISON:  06/19/2021. FINDINGS: CT HEAD FINDINGS Brain: No evidence of acute infarction, hemorrhage, hydrocephalus, extra-axial collection or mass lesion/mass effect. Ventricular and sulcal enlargement reflecting mild diffuse atrophy. Patchy white matter hypoattenuation noted bilaterally consistent with chronic microvascular ischemic change. These findings are stable. Vascular: No hyperdense vessel or unexpected calcification. Skull: Normal. Negative for fracture or focal lesion. Sinuses/Orbits: Globes and orbits are unremarkable. Visualized sinuses are clear. Other: None. CT CERVICAL SPINE FINDINGS Alignment: Straightened cervical lordosis.  No spondylolisthesis. Skull base and vertebrae: No acute fracture. No primary bone lesion or focal pathologic process. Soft tissues and spinal canal: No prevertebral fluid or swelling. No visible canal hematoma. Disc levels: Mild loss of disc height at C2-C3. Marked loss of disc height at C3-C4, C5-C6 and C6-C7. Moderate loss of disc height at C4-C5. Disc bulging noted throughout the cervical spine. Bilateral facet degenerative changes, most evident on the left at C4-C5. No convincing disc herniation. No change. Upper chest: No acute findings. Other: None. IMPRESSION: HEAD CT 1. No acute intracranial abnormalities. CERVICAL CT 1. No fracture or acute finding. Electronically Signed   By: Lajean Manes M.D.   On: 10/25/2021 14:40   DG Chest 2 View  Result Date: 10/25/2021 CLINICAL DATA:  Fall EXAM: CHEST - 2 VIEW  COMPARISON:  10/19/2021 FINDINGS: No significant change in chest radiographs. Cardiomegaly with small, left greater than right bilateral pleural effusions and associated atelectasis or consolidation. No new airspace opacity. Disc degenerative disease of the thoracic spine. Osteopenia. IMPRESSION: No significant change in chest radiographs. Cardiomegaly with small, left greater than right bilateral pleural effusions and associated atelectasis or consolidation. No new airspace opacity. Electronically Signed   By: Delanna Ahmadi M.D.   On: 10/25/2021 14:17   DG Pelvis 1-2 Views  Result Date: 10/25/2021 CLINICAL DATA:  Trauma, fall EXAM: PELVIS - 1-2 VIEW COMPARISON:  06/19/2021 FINDINGS: No displaced fracture or dislocation is seen. Osteopenia is seen in bony structures. Degenerative changes are noted pubic symphysis and visualized lower lumbar spine. There are scattered vascular calcifications. IMPRESSION: No recent fracture or dislocation is seen. Electronically Signed   By: Elmer Picker M.D.   On: 10/25/2021 14:17     Assessment and Plan:   Chronic diastolic heart failure RV failure  Severe mitral stenosis  Severe aortic stenosis  - had ongoing DOE and leg edema past 2 month, seen in the office 09/17/21, started on PRN Lasix for CHF symptoms,  had expressed wish for no surgery for valvular disease - Echo 09/06/21 with LVEF preserved, AV leaflet are severely thickened and peak and mean gradient through the valve are 41 and 21 mmHg respectively. [name] is 0.67 cm2. Dimensionless index is 0.21 consistent with low gradient severe AS. MV is also thickened/calcified with restricted motion. Peak and mean gradient through the valve are 27 and 9 mmHg respectively with MVA of 0.56 cm2 overall consistent with severe Robin. Mild to moderate MR. Moderate TR. Moderate PR.  - she denied worsening of DOE, chest pain, syncope, and has no leg edema today  - she continue wish no surgery for her valves  - continue PRN  lasix 69m for increased SOB, weight gain >2 pounds in 1 day or > 5 pounds in 1 week, or leg edema  - will hold off add SGLT2i given her advanced age   Fall and weakness - not from decompensated CHF, more from pneumonia/deconditioning, consider B12, Vit D, etc  HTN - BP borderline low at this time, will hold losartan to avoid hypotension as she is preload dependent, continue metoprolol 247mBID   History of DVT/factor V Leiden/PE - continue PT coumadin   HLD Hypothyroidism - per primary team     Risk Assessment/Risk Scores:     New York Heart Association (NYHA) Functional Class NYHA Class I     For questions or updates, please contact [facility] consult [email or web address] for contact info under    Signed, XiMargie BilletNP  10/26/2021 2:22 PM  Personally seen and examined. Agree with above.  [age]ear old with severe valvular disease, low flow low gradient aortic stenosis with severe mitral stenosis as well on lifelong Coumadin secondary to factor V Leiden deficiency and prior pulmonary embolism, marked kyphosis who presented with confusion and fall while at [employer] She recently had discussion in cardiology clinic to contemplate therapies for valvular heart disease, she declined surgical or structural intervention.  She reiterates these wishes again today.  She states that she did not have any shortness of breath.  No chest pain.  Blood pressure has been fluctuating.  Now quite low but upon arrival fairly elevated.  Recommendation is to continue with as needed Lasix for increased shortness of breath or weight gain.  I do not feel as though she is in any evidence of heart failure at the moment.  We have stopped her losartan given her current hypotension.  Okay to continue with metoprolol 25 twice daily.  Okay to continue with Coumadin.  Obviously if significant bleeding occurs with more frequent falls, consideration of discontinuation must take place.   She will be at risk given her factor V Leiden deficiency, hypercoagulable state.  We will go ahead and sign off.  Please let usKoreanow we can be of further assistance.  MaCandee FurbishMD

## 2021-10-26 NOTE — Telephone Encounter (Signed)
Prolia VOB initiated via [email or web address]  Last Prolia inj 06/11/21 Next Prolia inj due 12/12/21

## 2021-10-26 NOTE — Telephone Encounter (Signed)
Pt's daughter wanted tp make provider aware that pt is currently in the hospital. Please advise

## 2021-10-26 NOTE — Evaluation (Signed)
Occupational Therapy Evaluation Patient Details Name: Robin Harrell MRN: [id number] DOB: 31-Jul-1933 Today's Date: 10/26/2021   History of Present Illness Pt presented to ED on 9/4 with fall and weakness. Pt with recent diagnosis of possible PNA and was on antibiotics. PMH - DVT, PE, Factor V Leiden, HTN, CHF, Parkinsons, rt TKR.   Clinical Impression    [name] was evaluated s/p the above admission list, she reports being mod I for household mobility and assist for bathing and dressing from a caregiver 2x/week. Upon evaluation pt was limited by fatigue, poor activity tolerance, general weakness and elevated HR with activity. Overall she was mod-total A for bed mobility, mod A to stand and task shuffling steps at the bed side, min A for UB ADLs and max A for LB ADLs. She will benefit from OT acutely. Recommend d/c to SNF for continued rehab prior to going back home to ILF.      Recommendations for follow up therapy are one component of a multi-disciplinary discharge planning process, led by the attending physician.  Recommendations may be updated based on patient status, additional functional criteria and insurance authorization.   Follow Up Recommendations  Skilled nursing-short term rehab (<3 hours/day)    Assistance Recommended at Discharge Frequent or constant Supervision/Assistance  Patient can return home with the following A lot of help with walking and/or transfers;A lot of help with bathing/dressing/bathroom;Assistance with cooking/housework;Assistance with feeding;Direct supervision/assist for medications management;Direct supervision/assist for financial management;Help with stairs or ramp for entrance;Assist for transportation    Functional Status Assessment  Patient has had a recent decline in their functional status and demonstrates the ability to make significant improvements in function in a reasonable and predictable amount of time.  Equipment Recommendations  None recommended  by OT    Recommendations for Other Services       Precautions / Restrictions Precautions Precautions: Fall Precaution Comments: watch O2 and HR      Mobility Bed Mobility Overal bed mobility: Needs Assistance Bed Mobility: Supine to Sit, Sit to Supine, Rolling Rolling: Max assist   Supine to sit: Max assist Sit to supine: Total assist        Transfers Overall transfer level: Needs assistance Equipment used: Rolling walker (2 wheels) Transfers: Sit to/from Stand Sit to Stand: Mod assist           General transfer comment: pain wtih standing, only tolerated a few seconds with 2-3 side shuffles prior to sitting      Balance Overall balance assessment: Needs assistance Sitting-balance support: No upper extremity supported, Feet supported Sitting balance-Leahy Scale: Fair Sitting balance - Comments: kyphotic, unable to lift head in sitting   Standing balance support: Bilateral upper extremity supported, Reliant on assistive device for balance Standing balance-Leahy Scale: Poor                             ADL either performed or assessed with clinical judgement   ADL Overall ADL's : Needs assistance/impaired Eating/Feeding: Set up;Sitting Eating/Feeding Details (indicate cue type and reason): supported in bed Grooming: Set up;Sitting Grooming Details (indicate cue type and reason): supported in bed Upper Body Bathing: Minimal assistance;Sitting   Lower Body Bathing: Maximal assistance;Sit to/from stand   Upper Body Dressing : Minimal assistance;Sitting   Lower Body Dressing: Maximal assistance;Sit to/from stand   Toilet Transfer: Moderate assistance;Stand-pivot;BSC/3in1 Toilet Transfer Details (indicate cue type and reason): only tolerates pivotal steps Toileting- Clothing Manipulation and Hygiene: Maximal assistance;Sit  to/from stand       Functional mobility during ADLs: Moderate assistance;Rolling walker (2 wheels) General ADL Comments:  poor activity tolerance, very weak     Vision Baseline Vision/History: 0 No visual deficits Vision Assessment?: No apparent visual deficits      Pertinent Vitals/Pain Pain Assessment Pain Assessment: Faces Faces Pain Scale: Hurts little more Pain Location: generalized with standing Pain Descriptors / Indicators: Discomfort, Grimacing Pain Intervention(s): Monitored during session, Limited activity within patient's tolerance     Hand Dominance Right   Extremity/Trunk Assessment Upper Extremity Assessment Upper Extremity Assessment: Generalized weakness (very limited over head ROM. kyphotic posture)   Lower Extremity Assessment Lower Extremity Assessment: Defer to PT evaluation   Cervical / Trunk Assessment Cervical / Trunk Assessment: Kyphotic (severe)   Communication Communication Communication: No difficulties   Cognition Arousal/Alertness: Awake/alert Behavior During Therapy: WFL for tasks assessed/performed Overall Cognitive Status: Within Functional Limits for tasks assessed                                       General Comments  HR to 150s briefly 2x with standing, resting in 100s            Home Living Family/patient expects to be discharged to:: Other (Comment) (ILF - [facility]) Living Arrangements: Spouse/significant other Available Help at Discharge: Available PRN/intermittently Type of Home: Independent living facility Home Access: Level entry     Home Layout: One level     Bathroom Shower/Tub: Occupational psychologist: Handicapped height     Home Equipment: Conservation officer, nature (2 wheels);Transport chair   Additional Comments: Husband is w/c bound. Pt has aide come in a couple of days a week.      Prior Functioning/Environment Prior Level of Function : Needs assist       Physical Assist : Mobility (physical);ADLs (physical) Mobility (physical): Gait ADLs (physical): Bathing;Dressing Mobility Comments: Modified  independent with rolling walker for short distances. Has to use transport chair to go longer distances ADLs Comments: caregiver 2x a week to assist with bathing. Pt states she can "normally" dress on her own. Family assists with IADLs        OT Problem List: Decreased strength;Decreased range of motion;Impaired balance (sitting and/or standing);Decreased activity tolerance;Decreased knowledge of use of DME or AE;Cardiopulmonary status limiting activity      OT Treatment/Interventions: Self-care/ADL training;Therapeutic exercise;DME and/or AE instruction;Therapeutic activities;Patient/family education;Balance training    OT Goals(Current goals can be found in the care plan section) Acute Rehab OT Goals Patient Stated Goal: to get stronger OT Goal Formulation: With patient Time For Goal Achievement: 11/09/21 Potential to Achieve Goals: Fair ADL Goals Pt Will Perform Grooming: with set-up;sitting Pt Will Perform Upper Body Dressing: with set-up;sitting Pt Will Perform Lower Body Dressing: with min assist;sit to/from stand Pt Will Transfer to Toilet: with min assist;ambulating;bedside commode Additional ADL Goal #1: pt will tolerate at least 3 minutes of OOB functional activity without sitting rest break  OT Frequency: Min 2X/week    Co-evaluation              AM-PAC OT "6 Clicks" Daily Activity     Outcome Measure Help from another person eating meals?: A Little Help from another person taking care of personal grooming?: A Little Help from another person toileting, which includes using toliet, bedpan, or urinal?: A Lot Help from another person bathing (including washing, rinsing, drying)?: A  Lot Help from another person to put on and taking off regular upper body clothing?: A Little Help from another person to put on and taking off regular lower body clothing?: A Lot 6 Click Score: 15   End of Session Equipment Utilized During Treatment: Gait belt;Rolling walker (2  wheels);Oxygen Nurse Communication: Mobility status  Activity Tolerance: Patient limited by fatigue Patient left: in bed;with call bell/phone within reach;with bed alarm set  OT Visit Diagnosis: Unsteadiness on feet (R26.81);Other abnormalities of gait and mobility (R26.89);Muscle weakness (generalized) (M62.81);History of falling (Z91.81)                Time: 6063-0160 OT Time Calculation (min): 18 min Charges:  OT General Charges $OT Visit: 1 Visit OT Evaluation $OT Eval Moderate Complexity: 1 Mod   Nishan Ovens A Tichina Koebel 10/26/2021, 5:13 PM

## 2021-10-26 NOTE — Progress Notes (Signed)
[facility] for warfarin Indication: pulmonary embolus, DVT, and Factor V Leiden   Assessment: 89 yof with hx Factor V Leiden, DVT/PE, severe aortic stenosis, mitral stenosis on warfarin PTA presenting s/p fall. Pharmacy consulted to dose warfarin inpatient. Noted DDI - patient was on levofloxacin (completed 6 of 14 days PTA). INR therapeutic on admission at 2.3 and last dose 9/4 prior to ED presentation. MD holding on antibiotics inpatient for now and monitoring. Imaging negative for bleed.  PTA warfarin dose: 7.'5mg'$  daily except 3.'75mg'$  MWF (last dose 9/4 AM PTA)  Today, INR of 2.3 is therapeutic.   Goal of Therapy:  INR 2-3 Monitor platelets by anticoagulation protocol: Yes   Plan:  Warfarin 7.5 mg x1  Daily INR Monitor CBC, s/sx bleeding, DDI  Cristela Felt, PharmD, BCPS Clinical Pharmacist 10/26/2021 7:57 AM

## 2021-10-26 NOTE — Progress Notes (Signed)
[facility]  PROGRESS NOTE  Robin Harrell CWU:[id number] DOB: 10-08-1933 DOA: 10/25/2021 PCP: [name], MD   Brief HPI:    88 y.o. female, with history of DVT, hypertension, chronic HFpEF with right ventricular failure, severe aortic stenosis, severe mitral stenosis GERD, Parkinson disease, Factor V Leiden with prior history of pulmonary embolism on anticoagulation with Coumadin was brought to the ED for evaluation of fall and weakness.   Subjective   Patient seen and examined, still complains of generalized weakness and lethargy.  Last night required oxygen, 2 L/min.  Also became tachycardic this morning.   Assessment/Plan:    Generalized weakness/acute on chronic diastolic CHF -Patient has significant cardiac history including aortic and mitral stenosis, chronic diastolic CHF with grade 3 diastolic dysfunction -She only takes Lasix as needed -She has refused surgery for aortic stenosis as per note from cardiology as outpatient -She requires adjustment of her medications -We will consult cardiology for further recommendations  Recent diagnosis of and treatment for pneumonia -Patient received 7 days of antibiotics for pneumonia -Still complains of cough with clear phlegm with intermittent yellow phlegm -She has been febrile -We will start Mucinex 600 mg p.o. twice daily  Fall -Multifactorial from underlying multiple medical problems -PT OT consulted  History of DVT/factor V Leiden/PE -Patient is on warfarin prior to admission -NR is therapeutic -Pharmacy consulted for adjustment of Coumadin dosing  Hypertension -Blood pressure is stable -Continue Cozaar, metoprolol  Hypothyroidism -Continue Synthroid -TSH 3.217  Hyperlipidemia -Continue Lipitor     Medications     atorvastatin  10 mg Oral q1800   brimonidine  1 drop Both Eyes BID   fluticasone  2 spray Each Nare Daily   latanoprost  1 drop Both Eyes QHS   levothyroxine  25 mcg Oral QAC  breakfast   losartan  25 mg Oral Daily   metoprolol tartrate  25 mg Oral BID   sodium chloride flush  3 mL Intravenous Q12H   warfarin  7.5 mg Oral ONCE-1600   Warfarin - Pharmacist Dosing Inpatient   Does not apply q1600     Data Reviewed:   CBG:  No results for input(s): "GLUCAP" in the last 168 hours.  SpO2: 100 % O2 Flow Rate (L/min): 2 L/min    Vitals:   10/26/21 0500 10/26/21 0600 10/26/21 0626 10/26/21 0700  BP: 138/71 129/70  (!) 139/94  Pulse: 84 85  94  Resp: 20 18    Temp:   97.9 F (36.6 C)   TempSrc:   Oral   SpO2: 96% 98%  100%      Data Reviewed:  Basic Metabolic Panel: Recent Labs  Lab 10/25/21 1324  NA 133*  K 4.2  CL 102  CO2 22  GLUCOSE 84  BUN 10  CREATININE 0.99  CALCIUM 8.5*    CBC: Recent Labs  Lab 10/25/21 1324 10/26/21 0344  WBC 5.9 5.5  NEUTROABS 4.1  --   HGB 10.7* 10.0*  HCT 33.0* 30.6*  MCV 95.4 94.4  PLT 209 178    LFT Recent Labs  Lab 10/25/21 1324  AST 26  ALT 22  ALKPHOS 64  BILITOT 0.8  PROT 5.9*  ALBUMIN 3.0*     Antibiotics: Anti-infectives (From admission, onward)    Start     Dose/Rate Route Frequency Ordered Stop   10/25/21 1545  cefTRIAXone (ROCEPHIN) 2 g in sodium chloride 0.9 % 100 mL IVPB        2 g 200 mL/hr over  30 Minutes Intravenous  Once 10/25/21 1540 10/25/21 1845        DVT prophylaxis: Patient is on warfarin  Code Status: Full code  Family Communication: Discussed with patient's daughter on 10/25/2021 at bedside   CONSULTS    Objective    Physical Examination:   General: Appears lethargic Cardiovascular: S1-S2, regular, grade 3/6 systolic murmur at the mitral and aortic area Respiratory: Decreased breath sounds at lung bases Abdomen: Abdomen soft, nontender, no organomegaly Extremities: No edema in the lower extremities Neurologic: Alert, oriented x3, intact insight and judgment, no focal deficit   Status is: Inpatient: Generalized weakness            [place]   [facility] If 7PM-7AM, please contact night-coverage at [email or web address], Office  [phone]   10/26/2021, 8:31 AM  LOS: 0 days

## 2021-10-27 ENCOUNTER — Ambulatory Visit: Payer: Medicare Other

## 2021-10-27 ENCOUNTER — Encounter: Payer: Self-pay | Admitting: Family Medicine

## 2021-10-27 ENCOUNTER — Ambulatory Visit: Payer: Medicare Other | Admitting: Allergy

## 2021-10-27 DIAGNOSIS — D682 Hereditary deficiency of other clotting factors: Secondary | ICD-10-CM

## 2021-10-27 DIAGNOSIS — M170 Bilateral primary osteoarthritis of knee: Secondary | ICD-10-CM | POA: Diagnosis not present

## 2021-10-27 DIAGNOSIS — I5032 Chronic diastolic (congestive) heart failure: Secondary | ICD-10-CM | POA: Diagnosis not present

## 2021-10-27 DIAGNOSIS — D649 Anemia, unspecified: Secondary | ICD-10-CM | POA: Diagnosis not present

## 2021-10-27 DIAGNOSIS — L89151 Pressure ulcer of sacral region, stage 1: Secondary | ICD-10-CM | POA: Diagnosis not present

## 2021-10-27 DIAGNOSIS — R110 Nausea: Secondary | ICD-10-CM | POA: Diagnosis not present

## 2021-10-27 DIAGNOSIS — I10 Essential (primary) hypertension: Secondary | ICD-10-CM

## 2021-10-27 DIAGNOSIS — Z86718 Personal history of other venous thrombosis and embolism: Secondary | ICD-10-CM

## 2021-10-27 DIAGNOSIS — J302 Other seasonal allergic rhinitis: Secondary | ICD-10-CM | POA: Diagnosis not present

## 2021-10-27 DIAGNOSIS — R29898 Other symptoms and signs involving the musculoskeletal system: Secondary | ICD-10-CM | POA: Diagnosis not present

## 2021-10-27 DIAGNOSIS — R531 Weakness: Secondary | ICD-10-CM | POA: Diagnosis not present

## 2021-10-27 DIAGNOSIS — Z9181 History of falling: Secondary | ICD-10-CM | POA: Diagnosis not present

## 2021-10-27 DIAGNOSIS — H409 Unspecified glaucoma: Secondary | ICD-10-CM | POA: Diagnosis not present

## 2021-10-27 DIAGNOSIS — R55 Syncope and collapse: Secondary | ICD-10-CM | POA: Diagnosis not present

## 2021-10-27 DIAGNOSIS — E785 Hyperlipidemia, unspecified: Secondary | ICD-10-CM | POA: Diagnosis not present

## 2021-10-27 DIAGNOSIS — Z23 Encounter for immunization: Secondary | ICD-10-CM | POA: Diagnosis not present

## 2021-10-27 DIAGNOSIS — D693 Immune thrombocytopenic purpura: Secondary | ICD-10-CM | POA: Diagnosis not present

## 2021-10-27 DIAGNOSIS — I5030 Unspecified diastolic (congestive) heart failure: Secondary | ICD-10-CM | POA: Diagnosis not present

## 2021-10-27 DIAGNOSIS — K219 Gastro-esophageal reflux disease without esophagitis: Secondary | ICD-10-CM | POA: Diagnosis not present

## 2021-10-27 DIAGNOSIS — Z7901 Long term (current) use of anticoagulants: Secondary | ICD-10-CM | POA: Diagnosis not present

## 2021-10-27 DIAGNOSIS — G20 Parkinson's disease: Secondary | ICD-10-CM | POA: Diagnosis not present

## 2021-10-27 DIAGNOSIS — E039 Hypothyroidism, unspecified: Secondary | ICD-10-CM | POA: Diagnosis not present

## 2021-10-27 DIAGNOSIS — Z7401 Bed confinement status: Secondary | ICD-10-CM | POA: Diagnosis not present

## 2021-10-27 DIAGNOSIS — R1312 Dysphagia, oropharyngeal phase: Secondary | ICD-10-CM | POA: Diagnosis not present

## 2021-10-27 DIAGNOSIS — M533 Sacrococcygeal disorders, not elsewhere classified: Secondary | ICD-10-CM | POA: Diagnosis not present

## 2021-10-27 DIAGNOSIS — R2681 Unsteadiness on feet: Secondary | ICD-10-CM | POA: Diagnosis not present

## 2021-10-27 DIAGNOSIS — Z86711 Personal history of pulmonary embolism: Secondary | ICD-10-CM | POA: Diagnosis not present

## 2021-10-27 DIAGNOSIS — N1831 Chronic kidney disease, stage 3a: Secondary | ICD-10-CM | POA: Diagnosis not present

## 2021-10-27 DIAGNOSIS — M6281 Muscle weakness (generalized): Secondary | ICD-10-CM | POA: Diagnosis not present

## 2021-10-27 DIAGNOSIS — R0609 Other forms of dyspnea: Secondary | ICD-10-CM | POA: Diagnosis not present

## 2021-10-27 DIAGNOSIS — J168 Pneumonia due to other specified infectious organisms: Secondary | ICD-10-CM | POA: Diagnosis not present

## 2021-10-27 DIAGNOSIS — E860 Dehydration: Secondary | ICD-10-CM | POA: Diagnosis not present

## 2021-10-27 DIAGNOSIS — R2689 Other abnormalities of gait and mobility: Secondary | ICD-10-CM | POA: Diagnosis not present

## 2021-10-27 LAB — PROTIME-INR
INR: 2.3 — ABNORMAL HIGH (ref 0.8–1.2)
Prothrombin Time: 25 seconds — ABNORMAL HIGH (ref 11.4–15.2)

## 2021-10-27 MED ORDER — FUROSEMIDE 20 MG PO TABS: 20.0000 mg | ORAL_TABLET | Freq: Every day | ORAL | Status: AC | PRN

## 2021-10-27 MED ORDER — ACETAMINOPHEN 325 MG PO TABS: 650.0000 mg | ORAL_TABLET | Freq: Four times a day (QID) | ORAL | Status: DC | PRN

## 2021-10-27 MED ORDER — GUAIFENESIN ER 600 MG PO TB12
600.0000 mg | ORAL_TABLET | Freq: Two times a day (BID) | ORAL | Status: DC
Start: 2021-10-27 — End: 2022-01-06

## 2021-10-27 MED ORDER — WARFARIN 1.25 MG HALF TABLET
3.7500 mg | ORAL_TABLET | Freq: Once | ORAL | Status: DC
  Filled 2021-10-27: qty 1

## 2021-10-27 NOTE — TOC Initial Note (Addendum)
Transition of Care [facility]) - Initial/Assessment Note    Patient Details  Name: Robin Harrell MRN: [id number] Date of Birth: [birth year]  Transition of Care [facility]) CM/SW Contact:    Benard Halsted, LCSW Phone Number: 10/27/2021, 12:16 PM  Clinical Narrative:                 12pm-CSW received request from [facility] that she spoke with patient and patient's daughter, [name], reports understanding of need for SNF and daughter has questions for CSW.  CSW spoke with [name]. She expressed understanding of PT recommendation and is agreeable to SNF placement at time of discharge. CSW discussed insurance authorization process through the [facility] waiver and confirmed with [facility] RN that patient is active. CSW provided SNF bed offers to [place]. She is going to confirm with her sister and call CSW back.   1:15pm-CSW received call from [name] and they have selected private room at [facility]. [name] reporting preference for [facility] for transport due to risk of falling with Inr issues. CSW emailed [facility] details.   Skilled Nursing Rehab Facilities-   [email or web address]   Ratings out of 5 stars (the highest)   Name Address  Phone # [place] Inspection Overall  [facility] [address] [place] '5 5 2 4  '$ [facility]  [address] [place], [place] [phone] '4 2 5 5  '$ [facility] [place], [place] '1 1 1 1  '$ [facility] [place], [place] '2 2 4 4  '$ [facility] [address], [place] '1 1 2 1  '$ [place] [facility] [address], [place] [phone] '3 2 4 4  '$ [facility] [address], [place] '5 1 3 3  '$ [facility] [address] [place] '5 2 3 4  '$ [address] ([place]) [place], [place] [phone] '4 1 2 1  '$ [facility] [phone], [place] [phone] '4 1 1 1  '$ [facility] [address] [facility] [phone] '3 1 2 1  '$ [facility] ([place]) [place]. [place], [place] [phone] '4 1 1 1  '$ [facility]ock [address] [place] [phone] '4 2 4 4          '$ [facility] [place] '4 1 3 2  '$ [facility] [place] [address] [place] '3 1 5 4  '$ [facility], [place] [address], [place] [phone] '1 1 1 1  '$ [facility] [address], [address] [phone] '2 2 3 3          '$ [address] (no [facility]) [place] [address], [place] [phone] '5 5 5 5  '$ [facility] (No Humana) [address] [phone] [address], [place] '4 1 4 3  '$ [facility] (No UHC) [place], [place] '5 5 5 5  '$ [facility] [address], [address] [phone] '2 3 5 5  '$ [place] [address], [place] '1 1 2 1  '$ [facility] [address] [place] '3 1 1 1  '$ [place], [place] '5 2 4 5  '$ [facility]  [address] [place] '2 2 1 1  '$ [facility] [address], [place] '3 2 1 1  '$ [facility] [place], [place] '2 2 2 '$ 2  [place], [place] [phone] '1 1 1 1  '$ [place] [address], [place]  [phone] '2 4 2 2  '$ [facility]  [address], [place] [phone] '4 2 3 3  '$ [place] [address] [place] '2 1 1 1  '$ [place] (No Humana) [address] [place] [phone] '2 2 3 3  '$ [facility] [address], [place] [phone] '2 1 1 1          '$ [facility] [place], [place] '5 4 5 5  '$ [facility] ([place])  [address], [place] '2 1 2 1  '$ [facility]) [place] [address], [place]  '3 1 4 3  '$ [place] [address] [place] '3 3 4 4  '$ [address] [place], [place] '2 3 1 1  '$ [facility]) [address] [place] [phone] '2 1 4 3     '$ Expected Discharge Plan: [place] Barriers to Discharge: Insurance Authorization   Patient Goals and CMS Choice Patient states their goals for this hospitalization and ongoing recovery are:: Rehab CMS Medicare.gov Compare Post Acute Care list provided to:: Patient Choice offered to / list presented to : Patient, Adult Children  Expected Discharge Plan and Services Expected Discharge Plan: [place] In-house Referral: Clinical Social Work   Post Acute Care Choice: [place] Living arrangements for the past 2 months: [place] Expected Discharge Date: 10/27/21                                    Prior Living Arrangements/Services Living arrangements for the past 2 months: [place] Lives with:: Self Patient language and need for interpreter reviewed:: Yes Do you feel safe going back to the place where you live?: Yes      Need for Family Participation in Patient Care: Yes (Comment) Care giver support system in place?: Yes (comment)   Criminal Activity/Legal Involvement Pertinent to Current Situation/Hospitalization: No - Comment as needed  Activities of Daily Living Home Assistive Devices/Equipment: [place] Rile (specify type) ADL Screening (condition at time of admission) Patient's cognitive ability adequate to safely complete daily activities?: Yes Is the patient deaf or have difficulty hearing?: No Does the patient have difficulty seeing, even when wearing glasses/contacts?: No Does the patient have difficulty concentrating, remembering, or making decisions?: No Patient able to express need for assistance with ADLs?: Yes Does the patient have difficulty  dressing or bathing?: Yes Independently performs ADLs?: Yes (appropriate for developmental age) Does the patient have difficulty walking or climbing stairs?: Yes Weakness of Legs: Both Weakness of Arms/Hands: Both  Permission Sought/Granted Permission sought to share information with : Facility Sport and exercise psychologist, Family Supports Permission granted to share information with : Yes, Verbal Permission Granted  Share Information with NAME: [name]  Permission granted to share info w AGENCY: SNFs  Permission granted to share info w Relationship: Daughters  Permission granted to share info w Contact Information: [phone]  Emotional Assessment Appearance:: Appears stated age Attitude/Demeanor/Rapport: Engaged Affect (typically observed): Accepting, Appropriate Orientation: : Oriented to Self, Oriented to Place, Oriented to  Time, Oriented to Situation Alcohol / Substance Use: Not Applicable Psych Involvement: No (comment)  Admission diagnosis:  Weakness [R53.1] Near syncope [R55] Pneumonia due to infectious organism, unspecified laterality, unspecified part of lung [  J18.9] Patient Active Problem List   Diagnosis Date Noted   Near syncope 10/25/2021   DOE (dyspnea on exertion) 09/10/2021   Traumatic coccydynia 11/04/2020   Primary osteoarthritis of both knees 11/18/2019   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 01/29/2019   Chronic venous stasis dermatitis of both lower extremities 12/31/2018   Chronic diastolic CHF (congestive heart failure) ([place])    Severe aortic stenosis 02/07/2017   History of recurrent UTIs 01/26/2017   Chronic idiopathic thrombocytopenia ([place]) 04/14/2016   Essential tremor 09/03/2015   History of respiratory failure 11/11/2014   Hypothyroidism (acquired) 09/02/2014   Edema of right lower extremity 02/01/2013   Mixed hyperlipidemia 11/19/2012   Colon polyp 10/15/2012   Long term (current) use of anticoagulants 05/10/2012   Insomnia 10/11/2011   Osteoarthritis,  multiple sites 92/12/9415   Diastolic dysfunction 40/81/4481   Factor V deficiency ([place]) 02/09/2010   Osteoporosis 02/09/2010   DJD (degenerative joint disease), lumbar 10/14/2009   Chronic rhinitis 09/04/2008   Glaucoma 01/29/2008   Essential hypertension 11/15/2007   History of DVT (deep vein thrombosis) 11/15/2007   Seasonal and perennial allergic rhinitis 11/15/2007   GERD 11/15/2007   Upper airway cough syndrome 11/15/2007   Chronic cough 11/15/2007   PCP:  [name], MD Pharmacy:   [facility] [address], [place] - [address] [place] [address] Phone: [phone] Fax: [phone]  [facility] - [place], [place] - [address] [place] [address] [place] [address] Phone: [phone] Fax: [phone]  [id number] - [facility] [place], [place] [address] [place] [address] Phone: [phone] Fax: [phone]  [id number] - Closed - [place], [place] - [address] AT [facility] [address] [place] [address] Phone: [phone] Fax: [phone]  [place], [place] [address] Phone: [phone] Fax: [phone]     Social Determinants of Health (SDOH) Interventions    Readmission Risk Interventions     No data to display

## 2021-10-27 NOTE — Care Management Obs Status (Signed)
[place] NOTIFICATION   Patient Details  Name: Robin Harrell MRN: [id number] Date of Birth: April 24, 1933   Medicare Observation Status Notification Given:  Yes    Cyndi Bender, RN 10/27/2021, 9:59 AM

## 2021-10-27 NOTE — Discharge Instructions (Signed)
Follow with Primary MD [name], MD /SNF Physician   Get CBC, CMP, 2 view Chest X ray checked  by Primary MD next visit.    Activity: As tolerated with Full fall precautions use walker/cane & assistance as needed   Disposition SNF   Diet: Heart Healthy , with feeding assistance and aspiration precautions.  For Heart failure patients - Check your Weight same time everyday, if you gain over 2 pounds, or you develop in leg swelling, experience more shortness of breath or chest pain, call your Primary MD immediately. Follow Cardiac Low Salt Diet and 1.5 lit/day fluid restriction.   On your next visit with your primary care physician please Get Medicines reviewed and adjusted.   Please request your Prim.MD to go over all Hospital Tests and Procedure/Radiological results at the follow up, please get all Hospital records sent to your Prim MD by signing hospital release before you go home.   If you experience worsening of your admission symptoms, develop shortness of breath, life threatening emergency, suicidal or homicidal thoughts you must seek medical attention immediately by calling 911 or calling your MD immediately  if symptoms less severe.  You Must read complete instructions/literature along with all the possible adverse reactions/side effects for all the Medicines you take and that have been prescribed to you. Take any new Medicines after you have completely understood and accpet all the possible adverse reactions/side effects.   Do not drive, operating heavy machinery, perform activities at heights, swimming or participation in water activities or provide baby sitting services if your were admitted for syncope or siezures until you have seen by Primary MD or a Neurologist and advised to do so again.  Do not drive when taking Pain medications.    Do not take more than prescribed Pain, Sleep and Anxiety Medications  Special Instructions: If you have smoked or chewed Tobacco   in the last 2 yrs please stop smoking, stop any regular Alcohol  and or any Recreational drug use.  Wear Seat belts while driving.   Please note  You were cared for by a hospitalist during your hospital stay. If you have any questions about your discharge medications or the care you received while you were in the hospital after you are discharged, you can call the unit and asked to speak with the hospitalist on call if the hospitalist that took care of you is not available. Once you are discharged, your primary care physician will handle any further medical issues. Please note that NO REFILLS for any discharge medications will be authorized once you are discharged, as it is imperative that you return to your primary care physician (or establish a relationship with a primary care physician if you do not have one) for your aftercare needs so that they can reassess your need for medications and monitor your lab values.

## 2021-10-27 NOTE — Progress Notes (Signed)
[facility] for warfarin Indication: pulmonary embolus, DVT, and Factor V Leiden   Assessment: 74 yof with hx Factor V Leiden, DVT/PE, severe aortic stenosis, mitral stenosis on warfarin PTA presenting s/p fall. Pharmacy consulted to dose warfarin inpatient. Noted DDI - patient was on levofloxacin (completed 6 of 14 days PTA). INR therapeutic on admission at 2.3 and last dose 9/4 prior to ED presentation. MD holding on antibiotics inpatient for now and monitoring. Imaging negative for bleed.  PTA warfarin dose: 7.'5mg'$  daily except 3.'75mg'$  MWF (last dose 9/4 AM PTA)  Today, INR of 2.3 is therapeutic.   Goal of Therapy:  INR 2-3 Monitor platelets by anticoagulation protocol: Yes   Plan:  Warfarin 3.75 mg x1  Daily INR Monitor CBC, s/sx bleeding, DDI  Thank you Anette Guarneri, PharmD 10/27/2021 7:43 AM

## 2021-10-27 NOTE — Discharge Summary (Signed)
Physician Discharge Summary  Robin Harrell ZWC:[id number] DOB: [birth year] DOA: 10/25/2021  PCP: [name], MD  Admit date: 10/25/2021 Discharge date: 10/27/2021  Admitted From: [facility] Disposition:  SNF  Recommendations for Outpatient Follow-up:  Please keep encouraging to use incentive spirometry and flutter valve. Patient is currently on room air at time of discharge, she has been requiring 1 to 2 L intermittently. Continue with warfarin for target INR 2-3 continue PRN lasix '20mg'$  for increased SOB, weight gain >2 pounds in 1 day or > 5 pounds in 1 week, or leg edema     Discharge Condition Stable CODE STATUS:DNR>> confirmed by the patient Diet recommendation: Heart Healthy   Brief/Interim Summary: 88 y.o. female, with history of DVT, hypertension, chronic HFpEF with right ventricular failure, severe aortic stenosis, severe mitral stenosis GERD, Parkinson disease, Factor V Leiden with prior history of pulmonary embolism on anticoagulation with Coumadin was brought to the ED for evaluation of fall and weakness.  Generalized weakness Chronic diastolic CHF RV failure Severe mitral stenosis  severe aortic stenosis -Patient has significant cardiac history including aortic and mitral stenosis, chronic diastolic CHF with grade 3 diastolic dysfunction -She only takes Lasix as needed, to be continued at time of discharge on as needed basis for shortness of breath, weight gain more than 2 pounds in 1 day or 5 pounds in 1 week, or leg edema, hypoxia -She continues to wish for no surgery for her valves -Neurology input greatly appreciated  Generalized weakness -Is related to her recent pneumonia, deconditioning, and soft blood pressure -Plan to discharge to SNF for PT/OT  Recent diagnosis of and treatment for pneumonia -Patient received 7 days of antibiotics for pneumonia -Still complains of cough with clear phlegm with intermittent yellow phlegm - Continue with Mucinex -She is  afebrile. -Was provided by PT/OT, encouraged to continue using -no hypoxia, but requiring oxygen intermittently   Fall -Multifactorial from underlying multiple medical problems -PT OT consulted   History of DVT/factor V Leiden/PE -Patient is on warfarin prior to admission -INR is therapeutic  Hypertension -Blood pressure is stable to soft, , hold Cozaar and continue with metoprolol    Hypothyroidism -Continue Synthroid -TSH 3.217   Hyperlipidemia -Continue Lipitor    Discharge Diagnoses:  Principal Problem:   Near syncope Active Problems:   Essential hypertension   History of DVT (deep vein thrombosis)   GERD   Mixed hyperlipidemia   Factor V deficiency (HCC)   Chronic diastolic CHF (congestive heart failure) (HCC)    Discharge Instructions  Discharge Instructions     Diet - low sodium heart healthy   Complete by: As directed    Discharge instructions   Complete by: As directed    Follow with Primary MD [name], MD /SNF Physician   Get CBC, CMP, 2 view Chest X ray checked  by Primary MD next visit.    Activity: As tolerated with Full fall precautions use walker/cane & assistance as needed   Disposition SNF   Diet: Heart Healthy , with feeding assistance and aspiration precautions.  For Heart failure patients - Check your Weight same time everyday, if you gain over 2 pounds, or you develop in leg swelling, experience more shortness of breath or chest pain, call your Primary MD immediately. Follow Cardiac Low Salt Diet and 1.5 lit/day fluid restriction.   On your next visit with your primary care physician please Get Medicines reviewed and adjusted.   Please request your Prim.MD to go over all [facility]  Tests and Procedure/Radiological results at the follow up, please get all Hospital records sent to your Prim MD by signing hospital release before you go home.   If you experience worsening of your admission symptoms, develop shortness of breath,  life threatening emergency, suicidal or homicidal thoughts you must seek medical attention immediately by calling 911 or calling your MD immediately  if symptoms less severe.  You Must read complete instructions/literature along with all the possible adverse reactions/side effects for all the Medicines you take and that have been prescribed to you. Take any new Medicines after you have completely understood and accpet all the possible adverse reactions/side effects.   Do not drive, operating heavy machinery, perform activities at heights, swimming or participation in water activities or provide baby sitting services if your were admitted for syncope or siezures until you have seen by Primary MD or a Neurologist and advised to do so again.  Do not drive when taking Pain medications.    Do not take more than prescribed Pain, Sleep and Anxiety Medications  Special Instructions: If you have smoked or chewed Tobacco  in the last 2 yrs please stop smoking, stop any regular Alcohol  and or any Recreational drug use.  Wear Seat belts while driving.   Please note  You were cared for by a hospitalist during your hospital stay. If you have any questions about your discharge medications or the care you received while you were in the hospital after you are discharged, you can call the unit and asked to speak with the hospitalist on call if the hospitalist that took care of you is not available. Once you are discharged, your primary care physician will handle any further medical issues. Please note that NO REFILLS for any discharge medications will be authorized once you are discharged, as it is imperative that you return to your primary care physician (or establish a relationship with a primary care physician if you do not have one) for your aftercare needs so that they can reassess your need for medications and monitor your lab values.   Increase activity slowly   Complete by: As directed       Allergies  as of 10/27/2021       Reactions   Azithromycin Nausea Only   Codeine Nausea Only   Erythromycin Nausea And Vomiting        Medication List     STOP taking these medications    HYDROcodone bit-homatropine 5-1.5 MG/5ML syrup Commonly known as: HYCODAN   levofloxacin 500 MG tablet Commonly known as: LEVAQUIN   losartan 25 MG tablet Commonly known as: COZAAR       TAKE these medications    acetaminophen 325 MG tablet Commonly known as: TYLENOL Take 2 tablets (650 mg total) by mouth every 6 (six) hours as needed for mild pain (or Fever >/= 101). What changed:  medication strength how much to take reasons to take this   atorvastatin 10 MG tablet Commonly known as: LIPITOR TAKE 1 TABLET (10 MG TOTAL) BY MOUTH DAILY AT 6 PM. What changed: when to take this   brimonidine 0.2 % ophthalmic solution Commonly known as: ALPHAGAN Place 1 drop into both eyes 2 (two) times daily.   Calcium/D3 Adult Gummies 200-96.6-200 MG-MG-UNIT Chew Generic drug: Calcium-Phosphorus-Vitamin D Chew 2 tablets by mouth daily.   chlorpheniramine 4 MG tablet Commonly known as: CHLOR-TRIMETON Take 4 mg by mouth 2 (two) times daily.   fluticasone 50 MCG/ACT nasal spray Commonly known as:  FLONASE SPRAY 2 SPRAYS INTO EACH NOSTRIL EVERY DAY What changed: See the new instructions.   furosemide 20 MG tablet Commonly known as: LASIX Take 1 tablet (20 mg total) by mouth daily as needed for fluid.   ipratropium 0.03 % nasal spray Commonly known as: ATROVENT PLACE 2 SPRAYS INTO BOTH NOSTRILS 3 (THREE) TIMES DAILY AS NEEDED FOR RHINITIS.   latanoprost 0.005 % ophthalmic solution Commonly known as: XALATAN Place 1 drop into both eyes at bedtime.   levocetirizine 5 MG tablet Commonly known as: XYZAL TAKE 1 TABLET BY MOUTH EVERY DAY IN THE EVENING What changed:  how much to take how to take this when to take this additional instructions   levothyroxine 25 MCG tablet Commonly known as:  SYNTHROID Take 1 tablet (25 mcg total) by mouth daily before breakfast.   metoprolol tartrate 25 MG tablet Commonly known as: LOPRESSOR Take 1 tablet (25 mg total) by mouth 2 (two) times daily.   multivitamin-iron-minerals-folic acid chewable tablet Chew 1 tablet by mouth daily.   warfarin 7.5 MG tablet Commonly known as: COUMADIN Take as directed. If you are unsure how to take this medication, talk to your nurse or doctor. Original instructions: 1/2 tab M,W, F and 1 tablet on T,Th,S,Su What changed:  how much to take how to take this when to take this additional instructions        Allergies  Allergen Reactions   Azithromycin Nausea Only   Codeine Nausea Only   Erythromycin Nausea And Vomiting    Consultations: cardiology   Procedures/Studies: CT Head Wo Contrast  Result Date: 10/25/2021 CLINICAL DATA:  Fall.  Altered mental status. EXAM: CT HEAD WITHOUT CONTRAST CT CERVICAL SPINE WITHOUT CONTRAST TECHNIQUE: Multidetector CT imaging of the head and cervical spine was performed following the standard protocol without intravenous contrast. Multiplanar CT image reconstructions of the cervical spine were also generated. RADIATION DOSE REDUCTION: This exam was performed according to the departmental dose-optimization program which includes automated exposure control, adjustment of the mA and/or kV according to patient size and/or use of iterative reconstruction technique. COMPARISON:  06/19/2021. FINDINGS: CT HEAD FINDINGS Brain: No evidence of acute infarction, hemorrhage, hydrocephalus, extra-axial collection or mass lesion/mass effect. Ventricular and sulcal enlargement reflecting mild diffuse atrophy. Patchy white matter hypoattenuation noted bilaterally consistent with chronic microvascular ischemic change. These findings are stable. Vascular: No hyperdense vessel or unexpected calcification. Skull: Normal. Negative for fracture or focal lesion. Sinuses/Orbits: Globes and orbits  are unremarkable. Visualized sinuses are clear. Other: None. CT CERVICAL SPINE FINDINGS Alignment: Straightened cervical lordosis.  No spondylolisthesis. Skull base and vertebrae: No acute fracture. No primary bone lesion or focal pathologic process. Soft tissues and spinal canal: No prevertebral fluid or swelling. No visible canal hematoma. Disc levels: Mild loss of disc height at C2-C3. Marked loss of disc height at C3-C4, C5-C6 and C6-C7. Moderate loss of disc height at C4-C5. Disc bulging noted throughout the cervical spine. Bilateral facet degenerative changes, most evident on the left at C4-C5. No convincing disc herniation. No change. Upper chest: No acute findings. Other: None. IMPRESSION: HEAD CT 1. No acute intracranial abnormalities. CERVICAL CT 1. No fracture or acute finding. Electronically Signed   By: Lajean Manes M.D.   On: 10/25/2021 14:40   CT Cervical Spine Wo Contrast  Result Date: 10/25/2021 CLINICAL DATA:  Fall.  Altered mental status. EXAM: CT HEAD WITHOUT CONTRAST CT CERVICAL SPINE WITHOUT CONTRAST TECHNIQUE: Multidetector CT imaging of the head and cervical spine was performed following the  standard protocol without intravenous contrast. Multiplanar CT image reconstructions of the cervical spine were also generated. RADIATION DOSE REDUCTION: This exam was performed according to the departmental dose-optimization program which includes automated exposure control, adjustment of the mA and/or kV according to patient size and/or use of iterative reconstruction technique. COMPARISON:  06/19/2021. FINDINGS: CT HEAD FINDINGS Brain: No evidence of acute infarction, hemorrhage, hydrocephalus, extra-axial collection or mass lesion/mass effect. Ventricular and sulcal enlargement reflecting mild diffuse atrophy. Patchy white matter hypoattenuation noted bilaterally consistent with chronic microvascular ischemic change. These findings are stable. Vascular: No hyperdense vessel or unexpected  calcification. Skull: Normal. Negative for fracture or focal lesion. Sinuses/Orbits: Globes and orbits are unremarkable. Visualized sinuses are clear. Other: None. CT CERVICAL SPINE FINDINGS Alignment: Straightened cervical lordosis.  No spondylolisthesis. Skull base and vertebrae: No acute fracture. No primary bone lesion or focal pathologic process. Soft tissues and spinal canal: No prevertebral fluid or swelling. No visible canal hematoma. Disc levels: Mild loss of disc height at C2-C3. Marked loss of disc height at C3-C4, C5-C6 and C6-C7. Moderate loss of disc height at C4-C5. Disc bulging noted throughout the cervical spine. Bilateral facet degenerative changes, most evident on the left at C4-C5. No convincing disc herniation. No change. Upper chest: No acute findings. Other: None. IMPRESSION: HEAD CT 1. No acute intracranial abnormalities. CERVICAL CT 1. No fracture or acute finding. Electronically Signed   By: Lajean Manes M.D.   On: 10/25/2021 14:40   DG Chest 2 View  Result Date: 10/25/2021 CLINICAL DATA:  Fall EXAM: CHEST - 2 VIEW COMPARISON:  10/19/2021 FINDINGS: No significant change in chest radiographs. Cardiomegaly with small, left greater than right bilateral pleural effusions and associated atelectasis or consolidation. No new airspace opacity. Disc degenerative disease of the thoracic spine. Osteopenia. IMPRESSION: No significant change in chest radiographs. Cardiomegaly with small, left greater than right bilateral pleural effusions and associated atelectasis or consolidation. No new airspace opacity. Electronically Signed   By: Delanna Ahmadi M.D.   On: 10/25/2021 14:17   DG Pelvis 1-2 Views  Result Date: 10/25/2021 CLINICAL DATA:  Trauma, fall EXAM: PELVIS - 1-2 VIEW COMPARISON:  06/19/2021 FINDINGS: No displaced fracture or dislocation is seen. Osteopenia is seen in bony structures. Degenerative changes are noted pubic symphysis and visualized lower lumbar spine. There are scattered  vascular calcifications. IMPRESSION: No recent fracture or dislocation is seen. Electronically Signed   By: Elmer Picker M.D.   On: 10/25/2021 14:17   DG Chest 2 View  Result Date: 10/20/2021 CLINICAL DATA:  Productive cough. Worsening dyspnea on exertion. History of pneumonia. EXAM: CHEST - 2 VIEW COMPARISON:  Chest x-ray 09/09/2021, 06/19/2021. FINDINGS: Stable cardiomegaly. No pulmonary venous congestion. Low lung volumes with bibasilar atelectasis. Mild right base infiltrate cannot be excluded. Persistent left pleural effusion noted. Small right pleural effusion noted on today's exam. No pneumothorax. Diffuse osteopenia. Thoracic spine scoliosis and degenerative change. No acute fracture noted. IMPRESSION: 1.  Stable cardiomegaly.  No pulmonary venous congestion. 2. Low lung volumes with bibasilar atelectasis. Mild right base infiltrate cannot be excluded. Persistent left pleural effusion noted. Small right pleural effusion noted on today's exam. Electronically Signed   By: Marcello Moores  Register M.D.   On: 10/20/2021 09:14      Subjective:  No significant events overnight, sister reports cough, phlegm, overall improving.  Discharge Exam: Vitals:   10/27/21 0414 10/27/21 1100  BP:    Pulse:    Resp:    Temp: 98.6 F (37 C)  SpO2:  95%   Vitals:   10/26/21 2000 10/26/21 2342 10/27/21 0414 10/27/21 1100  BP:      Pulse:      Resp:      Temp: 98.4 F (36.9 C) 98.5 F (36.9 C) 98.6 F (37 C)   TempSrc: Oral Oral Oral   SpO2: 97%   95%    General: Pt is alert, awake, not in acute distress, extremely frail, deconditioned Cardiovascular: RRR, S1/S2 +, no rubs, no gallops Respiratory: CTA bilaterally, no wheezing, no rhonchi, diminished air entry at the bases Abdominal: Soft, NT, ND, bowel sounds + Extremities: no edema, no cyanosis    The results of significant diagnostics from this hospitalization (including imaging, microbiology, ancillary and laboratory) are listed below  for reference.     Microbiology: No results found for this or any previous visit (from the past 240 hour(s)).   Labs: BNP (last 3 results) Recent Labs    09/17/21 1547  BNP 938.1*   Basic Metabolic Panel: Recent Labs  Lab 10/25/21 1324  NA 133*  K 4.2  CL 102  CO2 22  GLUCOSE 84  BUN 10  CREATININE 0.99  CALCIUM 8.5*   Liver Function Tests: Recent Labs  Lab 10/25/21 1324  AST 26  ALT 22  ALKPHOS 64  BILITOT 0.8  PROT 5.9*  ALBUMIN 3.0*   No results for input(s): "LIPASE", "AMYLASE" in the last 168 hours. No results for input(s): "AMMONIA" in the last 168 hours. CBC: Recent Labs  Lab 10/25/21 1324 10/26/21 0344  WBC 5.9 5.5  NEUTROABS 4.1  --   HGB 10.7* 10.0*  HCT 33.0* 30.6*  MCV 95.4 94.4  PLT 209 178   Cardiac Enzymes: No results for input(s): "CKTOTAL", "CKMB", "CKMBINDEX", "TROPONINI" in the last 168 hours. BNP: Invalid input(s): "POCBNP" CBG: No results for input(s): "GLUCAP" in the last 168 hours. D-Dimer No results for input(s): "DDIMER" in the last 72 hours. Hgb A1c No results for input(s): "HGBA1C" in the last 72 hours. Lipid Profile No results for input(s): "CHOL", "HDL", "LDLCALC", "TRIG", "CHOLHDL", "LDLDIRECT" in the last 72 hours. Thyroid function studies Recent Labs    10/26/21 0344  TSH 3.217   Anemia work up No results for input(s): "VITAMINB12", "FOLATE", "FERRITIN", "TIBC", "IRON", "RETICCTPCT" in the last 72 hours. Urinalysis    Component Value Date/Time   COLORURINE YELLOW 10/25/2021 1354   APPEARANCEUR CLEAR 10/25/2021 1354   LABSPEC 1.008 10/25/2021 1354   PHURINE 7.0 10/25/2021 1354   GLUCOSEU NEGATIVE 10/25/2021 1354   GLUCOSEU NEGATIVE 12/31/2018 1152   HGBUR NEGATIVE 10/25/2021 1354   BILIRUBINUR NEGATIVE 10/25/2021 1354   BILIRUBINUR negative 01/22/2021 1138   KETONESUR NEGATIVE 10/25/2021 1354   PROTEINUR NEGATIVE 10/25/2021 1354   UROBILINOGEN 0.2 01/22/2021 1138   UROBILINOGEN 0.2 12/31/2018 1152    NITRITE NEGATIVE 10/25/2021 1354   LEUKOCYTESUR NEGATIVE 10/25/2021 1354   Sepsis Labs Recent Labs  Lab 10/25/21 1324 10/26/21 0344  WBC 5.9 5.5   Microbiology No results found for this or any previous visit (from the past 240 hour(s)).   Time coordinating discharge: Over 30 minutes  SIGNED:   Phillips Climes, MD  [facility] 10/27/2021, 12:10 PM Pager   If 7PM-7AM, please contact night-coverage [email or web address] Password [email or web address]

## 2021-10-27 NOTE — Progress Notes (Addendum)
Physical Therapy Treatment Patient Details Name: Robin Harrell MRN: [id number] DOB: [birth year] Today's Date: 10/27/2021   History of Present Illness Pt presented to ED on 9/4 with fall and weakness. Pt with recent diagnosis of possible PNA and was on antibiotics. PMH - DVT, PE, Factor V Leiden, HTN, CHF, Parkinsons, rt TKR.    PT Comments    Pt received in supine, agreeable to therapy session with encouragement, pt anxious regarding mobility and c/o constipation. Pt able to perform pivotal transfer to [facility] and short gait trial at bedside with RW and up to +59moA and needed maxA for bed mobility. Pt needing max cues for safety, body mechanics and pursed-lip breathing with increased WOB with mobility. VSS on RA at rest but SpO2 reading hypoxic with exertion so [place] replaced and still hypoxic (as low as 71% on 3L O2 [place]) although with poor waveform. Pt weaned back to RA once in chair and RN notified pt will need +2 assist for safety for pivotal transfers with gait belt, gait belt in room. Discussed need for short term low intensity post-acute rehab during session and pt appearing more agreeable, case manager notified.  Pt continues to benefit from PT services to progress toward functional mobility goals.   Recommendations for follow up therapy are one component of a multi-disciplinary discharge planning process, led by the attending physician.  Recommendations may be updated based on patient status, additional functional criteria and insurance authorization.  Follow Up Recommendations  Skilled nursing-short term rehab (<3 hours/day) Can patient physically be transported by private vehicle: Yes (with physical assist and pivoting from car to and from wheelchair)   Assistance Recommended at Discharge Frequent or constant Supervision/Assistance  Patient can return home with the following A little help with walking and/or transfers;A little help with bathing/dressing/bathroom;Assist for transportation    Equipment Recommendations  None recommended by PT    Recommendations for Other Services       Precautions / Restrictions Precautions Precautions: Fall Precaution Comments: watch O2 and HR Restrictions Weight Bearing Restrictions: No     Mobility  Bed Mobility Overal bed mobility: Needs Assistance Bed Mobility: Rolling, Sidelying to Sit Rolling: Mod assist Sidelying to sit: Max assist, HOB elevated       General bed mobility comments: cues for log roll sequencing, pt needing up to maxA to raise trunk and to advance hips close enough to edge of bed.    Transfers Overall transfer level: Needs assistance Equipment used: Rolling walker (2 wheels) Transfers: Sit to/from Stand Sit to Stand: Mod assist           General transfer comment: from EOB and to elevated BSC, pt can be impulsive to sit needs hand over hand assist to reach back.    Ambulation/Gait Ambulation/Gait assistance: +2 safety/equipment, Mod assist Gait Distance (Feet): 8 Feet (43fpivotal steps to BSMcl[facility]er Regionthen 110f5fo chair from [facility] device: Rolling walker (2 wheels) Gait Pattern/deviations: Step-through pattern, Decreased step length - left, Shuffle, Trunk flexed, Decreased step length - right, Leaning posteriorly       General Gait Details: Assist for balance and support. Pt unable to stand upright due to severe kyphosis, able to slightly lift her head with constant cues, SpO2 desat to 71% with exertion on 3-4L [place] (poor signal), once seated improves to >90% quickly and weaned to RA with SpO2 96% after sitting ~1.5 minutes, so possibly inaccurate O2 reading. Posterior LOB multiple times needing modA to correct with gait belt.  Balance Overall balance assessment: Needs assistance Sitting-balance support: No upper extremity supported, Feet supported Sitting balance-Leahy Scale: Fair Sitting balance - Comments: kyphotic, unable to lift head in sitting more than a few seconds   Standing  balance support: Bilateral upper extremity supported, Reliant on assistive device for balance Standing balance-Leahy Scale: Poor Standing balance comment: walker and up to modA due to posterior LOB while turning multiple times                            Cognition Arousal/Alertness: Awake/alert Behavior During Therapy: Anxious Overall Cognitive Status: Within Functional Limits for tasks assessed                                 General Comments: anxious and contradictory at times; pt states "I can't go home like this" and "I want to walk to the bathroom, I don't want to use the bedside commode" then immediately upon standing states "I have to use the BSC". Decreased insight into safety/deficits.        Exercises Other Exercises Other Exercises: supine BLE AROM: ankle pumps Other Exercises: IS x 10 reps (~548m) Other Exercises: encouraged hip flexion and LAQ seated but pt urgent to toilet so unable to assess    General Comments General comments (skin integrity, edema, etc.): HR WFL, SpO2 desat (see gait) but possibly inaccurate due to poor sensor signal      Pertinent Vitals/Pain Pain Assessment Pain Assessment: Faces Faces Pain Scale: Hurts little more Pain Location: generalized with standing and bed mobility and bowel movement and peri-care Pain Descriptors / Indicators: Discomfort, Grimacing, Moaning Pain Intervention(s): Monitored during session, Repositioned, Limited activity within patient's tolerance     PT Goals (current goals can now be found in the care plan section) Acute Rehab PT Goals Patient Stated Goal: to get stronger and go home PT Goal Formulation: With patient Time For Goal Achievement: 11/09/21 Progress towards PT goals: Progressing toward goals    Frequency    Min 3X/week      PT Plan Current plan remains appropriate       AM-PAC PT "6 Clicks" Mobility   Outcome Measure  Help needed turning from your back to your side  while in a flat bed without using bedrails?: A Lot Help needed moving from lying on your back to sitting on the side of a flat bed without using bedrails?: A Lot Help needed moving to and from a bed to a chair (including a wheelchair)?: A Lot Help needed standing up from a chair using your arms (e.g., wheelchair or bedside chair)?: A Lot Help needed to walk in hospital room?: Total (<231f Help needed climbing 3-5 steps with a railing? : Total 6 Click Score: 10    End of Session Equipment Utilized During Treatment: Gait belt;Oxygen Activity Tolerance: Patient limited by fatigue Patient left: in chair;with call bell/phone within reach;with chair alarm set;Other (comment) (elevated cushion under her hips) Nurse Communication: Mobility status PT Visit Diagnosis: Unsteadiness on feet (R26.81);Muscle weakness (generalized) (M62.81);History of falling (Z91.81)     Time: 102130-8657T Time Calculation (min) (ACUTE ONLY): 38 min  Charges:  $Gait Training: 8-22 mins $Therapeutic Exercise: 8-22 mins $Therapeutic Activity: 8-22 mins                     Ritesh Opara P., PTA Acute Rehabilitation Services Secure Chat Preferred 9a-5:30pm Office: [phone]  Ashling Roane M Amirrah Quigley 10/27/2021, 11:45 AM

## 2021-10-27 NOTE — TOC Transition Note (Signed)
Transition of Care [facility]) - CM/SW Discharge Note   Patient Details  Name: Robin Harrell MRN: [id number] Date of Birth: 1933-04-13  Transition of Care [facility]) CM/SW Contact:  Benard Halsted, [place] Phone Number: 10/27/2021, 1:41 PM   Clinical Narrative:    Patient will DC to: [facility] Anticipated DC date: 10/27/21 Family notified: Daughter, [name] Transport by: Corey Harold   Per MD patient ready for DC to [facility]. RN to call report prior to discharge [phone] Room 505). RN, patient, patient's family, and facility notified of DC. Discharge Summary and FL2 sent to facility. DC packet on chart including signed DNR. Ambulance transport requested for patient.   CSW will sign off for now as social work intervention is no longer needed. Please consult [place] again if new needs arise.     Final next level of care: Skilled Nursing Facility Barriers to Discharge: Barriers Resolved   Patient Goals and CMS Choice Patient states their goals for this hospitalization and ongoing recovery are:: Rehab CMS Medicare.gov Compare Post Acute Care list provided to:: Patient Choice offered to / list presented to : Patient, Adult Children  Discharge Placement   Existing PASRR number confirmed : 10/27/21          Patient chooses bed at: [facility] Patient to be transferred to facility by: [facility] Name of family member notified: [name] Patient and family notified of of transfer: 10/27/21  Discharge Plan and Services In-house Referral: Clinical Social Work   Post Acute Care Choice: [place]                               Social Determinants of Health (SDOH) Interventions     Readmission Risk Interventions     No data to display

## 2021-10-27 NOTE — NC FL2 (Signed)
[place] MEDICAID FL2 LEVEL OF CARE SCREENING TOOL     IDENTIFICATION  Patient Name: Robin Harrell Birthdate: [birth year] Sex: female Admission Date (Current Location): 10/25/2021  [facility] and [place] Number:  Herbalist and Address:  [facility] [place], [place] [address] [place], [place] [address]      Provider Number: [id number]  Attending Physician Name and Address:  Elgergawy, Silver Huguenin, MD  Relative Name and Phone Number:       Current Level of Care: Hospital Recommended Level of Care: [place] Prior Approval Number:    Date Approved/Denied:   PASRR Number: [id number]  Discharge Plan: SNF    Current Diagnoses: Patient Active Problem List   Diagnosis Date Noted   Near syncope 10/25/2021   DOE (dyspnea on exertion) 09/10/2021   Traumatic coccydynia 11/04/2020   Primary osteoarthritis of both knees 11/18/2019   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 01/29/2019   Chronic venous stasis dermatitis of both lower extremities 12/31/2018   Severe aortic stenosis 02/07/2017   History of recurrent UTIs 01/26/2017   Chronic idiopathic thrombocytopenia ([place]) 04/14/2016   Essential tremor 09/03/2015   History of respiratory failure 11/11/2014   Hypothyroidism (acquired) 09/02/2014   Edema of right lower extremity 02/01/2013   Mixed hyperlipidemia 11/19/2012   Colon polyp 10/15/2012   Long term (current) use of anticoagulants 05/10/2012   Insomnia 10/11/2011   Osteoarthritis, multiple sites 56/21/3086   Diastolic dysfunction 57/84/6962   Factor V deficiency ([place]) 02/09/2010   Osteoporosis 02/09/2010   DJD (degenerative joint disease), lumbar 10/14/2009   Chronic rhinitis 09/04/2008   Glaucoma 01/29/2008   Essential hypertension 11/15/2007   History of DVT (deep vein thrombosis) 11/15/2007   Seasonal and perennial allergic rhinitis 11/15/2007   GERD 11/15/2007   Upper airway cough syndrome 11/15/2007   Chronic cough 11/15/2007     Orientation RESPIRATION BLADDER Height & Weight     Self, Time, Situation, Place  Normal (Has needed 3L nasal cannula yesterday)) Incontinent, External catheter Weight:   Height:     BEHAVIORAL SYMPTOMS/MOOD NEUROLOGICAL BOWEL NUTRITION STATUS      Continent Diet (See dc summary)  AMBULATORY STATUS COMMUNICATION OF NEEDS Skin   Limited Assist Verbally Normal                       Personal Care Assistance Level of Assistance  Bathing, Feeding, Dressing Bathing Assistance: Limited assistance Feeding assistance: Independent Dressing Assistance: Limited assistance     Functional Limitations Info             SPECIAL CARE FACTORS FREQUENCY  PT (By licensed PT), OT (By licensed OT)     PT Frequency: 5x/week OT Frequency: 5x/week            Contractures Contractures Info: Not present    Additional Factors Info  Code Status, Allergies Code Status Info: Full Allergies Info: Azithromycin, Codeine, Erythromycin           Current Medications (10/27/2021):  This is the current hospital active medication list Current Facility-Administered Medications  Medication Dose Route Frequency Provider Last Rate Last Admin   0.9 %  sodium chloride infusion  250 mL Intravenous PRN Darrick Meigs, Marge Duncans, MD       acetaminophen (TYLENOL) tablet 650 mg  650 mg Oral Q6H PRN Oswald Hillock, MD       Or   acetaminophen (TYLENOL) suppository 650 mg  650 mg Rectal Q6H PRN Oswald Hillock, MD  atorvastatin (LIPITOR) tablet 10 mg  10 mg Oral q1800 Oswald Hillock, MD   10 mg at 10/26/21 1717   brimonidine (ALPHAGAN) 0.2 % ophthalmic solution 1 drop  1 drop Both Eyes BID Oswald Hillock, MD   1 drop at 10/27/21 0911   fluticasone (FLONASE) 50 MCG/ACT nasal spray 2 spray  2 spray Each Nare Daily Oswald Hillock, MD   2 spray at 10/27/21 0910   hydrALAZINE (APRESOLINE) tablet 25 mg  25 mg Oral Q6H PRN Oswald Hillock, MD       latanoprost (XALATAN) 0.005 % ophthalmic solution 1 drop  1 drop Both Eyes QHS  Oswald Hillock, MD   1 drop at 10/26/21 2213   levothyroxine (SYNTHROID) tablet 25 mcg  25 mcg Oral QAC breakfast Oswald Hillock, MD   25 mcg at 10/27/21 0550   metoprolol tartrate (LOPRESSOR) tablet 25 mg  25 mg Oral BID Oswald Hillock, MD   25 mg at 10/27/21 0909   ondansetron (ZOFRAN) tablet 4 mg  4 mg Oral Q6H PRN Oswald Hillock, MD       Or   ondansetron [facility]) injection 4 mg  4 mg Intravenous Q6H PRN Oswald Hillock, MD       sodium chloride flush (NS) 0.9 % injection 3 mL  3 mL Intravenous Q12H Darrick Meigs, Gagan S, MD   3 mL at 10/27/21 0910   sodium chloride flush (NS) 0.9 % injection 3 mL  3 mL Intravenous PRN Oswald Hillock, MD       warfarin (COUMADIN) tablet 3.75 mg  3.75 mg Oral ONCE-1600 Rozann Lesches, [facility]       Warfarin - Pharmacist Dosing Inpatient   Does not apply q1600 von Caren Griffins, [facility]         Discharge Medications: Please see discharge summary for a list of discharge medications.  Relevant Imaging Results:  Relevant Lab Results:   Additional Information SSN [id number]  Benard Halsted, LCSW

## 2021-10-27 NOTE — Plan of Care (Signed)

## 2021-10-28 DIAGNOSIS — D693 Immune thrombocytopenic purpura: Secondary | ICD-10-CM | POA: Diagnosis not present

## 2021-10-28 DIAGNOSIS — I5030 Unspecified diastolic (congestive) heart failure: Secondary | ICD-10-CM | POA: Diagnosis not present

## 2021-10-28 DIAGNOSIS — E785 Hyperlipidemia, unspecified: Secondary | ICD-10-CM | POA: Diagnosis not present

## 2021-10-28 DIAGNOSIS — R55 Syncope and collapse: Secondary | ICD-10-CM | POA: Diagnosis not present

## 2021-10-28 DIAGNOSIS — L89151 Pressure ulcer of sacral region, stage 1: Secondary | ICD-10-CM | POA: Diagnosis not present

## 2021-10-28 DIAGNOSIS — I5032 Chronic diastolic (congestive) heart failure: Secondary | ICD-10-CM | POA: Diagnosis not present

## 2021-10-28 DIAGNOSIS — I10 Essential (primary) hypertension: Secondary | ICD-10-CM | POA: Diagnosis not present

## 2021-10-28 DIAGNOSIS — Z7901 Long term (current) use of anticoagulants: Secondary | ICD-10-CM | POA: Diagnosis not present

## 2021-10-28 NOTE — Telephone Encounter (Addendum)
Pt ready for scheduling on or after 12/12/21  Out-of-pocket cost due at time of visit: $301  Primary: Medicare [place] co-insurance: 20% (approximately $276) Admin fee co-insurance: 20% (approximately $25)  Deductible: $226 of $226 met (covered by secondary)  Secondary: BCBS ID Medicare Supp [place] co-insurance: Covers Medicare Part B co-insurance Admin fee co-insurance: Covers Medicare Part B co-insurance  Deductible:  does not apply  Prior Auth: NOT required PA# Valid:   ** This summary of benefits is an estimation of the patient's out-of-pocket cost. Exact cost may vary based on individual plan coverage.   

## 2021-10-28 NOTE — Consult Note (Signed)
   [facility] Inpatient Consult   10/28/2021  Robin Harrell [facility] March 10, 1933 [id number]  [facility] [ACO] Patient: Medicare ACO REACH  Primary Care Provider:  [name], MD with [place] at [facility], is a [facility] provider with a Care Coordination team and program for transition of care needs.   *Late entry for  10/27/21 11:33 am Referral request from inpatient [facility] LCSW regarding patient to confirm eligibility as a [facility] ACO patient and confirmed.  If the patient goes to a [facility] affiliated facility then, patient can be followed by [facility] PAC RN with traditional Medicare and approved Medicare Advantage plans.    Plan:   [facility] PAC RN can follow for any known or needs for transitional care needs for returning to post facility care or complex disease management.  For questions or referrals, please contact:   Natividad Brood, RN BSN [facility] Liaison  [phone] business mobile phone Toll free office [phone]  Fax number: [phone] [email or web address]

## 2021-10-29 ENCOUNTER — Other Ambulatory Visit: Payer: Self-pay | Admitting: *Deleted

## 2021-10-29 DIAGNOSIS — Z86718 Personal history of other venous thrombosis and embolism: Secondary | ICD-10-CM | POA: Diagnosis not present

## 2021-10-29 DIAGNOSIS — Z7901 Long term (current) use of anticoagulants: Secondary | ICD-10-CM | POA: Diagnosis not present

## 2021-10-29 DIAGNOSIS — J302 Other seasonal allergic rhinitis: Secondary | ICD-10-CM | POA: Diagnosis not present

## 2021-10-29 NOTE — Patient Outreach (Signed)
Robin Harrell admitted to [facility] SNF under [place] 3-day SNF waiver.   Facility site visit to [facility]. Spoke with Robin Harrell at bedside. States she is from [place] with spouse. States her plan is to return to ILF. Robin Harrell endorses she continues to be weak. Participates with therapy.  States she did not wear home oxygen prior. Currently wearing oxygen.   Discussed writer will continue to follow while she resides in SNF. Left [facility] literature and writer's contact information at beside.   [name], MSN, RN,BSN [place] Acute Care Coordinator [phone] [facility]) [phone]  (Toll free office)

## 2021-11-01 ENCOUNTER — Ambulatory Visit: Payer: Medicare Other | Admitting: Family Medicine

## 2021-11-02 DIAGNOSIS — I5030 Unspecified diastolic (congestive) heart failure: Secondary | ICD-10-CM | POA: Diagnosis not present

## 2021-11-02 DIAGNOSIS — I10 Essential (primary) hypertension: Secondary | ICD-10-CM | POA: Diagnosis not present

## 2021-11-02 DIAGNOSIS — D693 Immune thrombocytopenic purpura: Secondary | ICD-10-CM | POA: Diagnosis not present

## 2021-11-02 DIAGNOSIS — L89151 Pressure ulcer of sacral region, stage 1: Secondary | ICD-10-CM | POA: Diagnosis not present

## 2021-11-05 NOTE — Telephone Encounter (Signed)
LVM to call back to schedule

## 2021-11-09 DIAGNOSIS — J302 Other seasonal allergic rhinitis: Secondary | ICD-10-CM | POA: Diagnosis not present

## 2021-11-15 ENCOUNTER — Encounter: Payer: Self-pay | Admitting: *Deleted

## 2021-11-17 ENCOUNTER — Encounter: Payer: Medicare Other | Admitting: Family Medicine

## 2021-11-18 ENCOUNTER — Telehealth: Payer: Self-pay | Admitting: Family Medicine

## 2021-11-18 ENCOUNTER — Other Ambulatory Visit: Payer: Self-pay | Admitting: *Deleted

## 2021-11-18 NOTE — Telephone Encounter (Signed)
Patient states she would like to know if PCP suggests her getting new COVID booster vaccine? Please Advise.

## 2021-11-18 NOTE — Patient Outreach (Signed)
[facility] Post- Acute Care Coordinator follow up. Robin Harrell resides in [facility]F. Screening for potential [facility] care coordination services as benefit of insurance plan and PCP.  Secure communication sent to SNF SW to inquire about transition plans and barriers.   Will continue to follow while Robin Harrell resides in [place].    [name], MSN, RN,BSN [place] Acute Care Coordinator [phone] (Direct dial)

## 2021-11-19 NOTE — Telephone Encounter (Signed)
Message sent thru MyChart 

## 2021-11-23 ENCOUNTER — Other Ambulatory Visit: Payer: Self-pay

## 2021-11-23 ENCOUNTER — Telehealth: Payer: Self-pay | Admitting: Family Medicine

## 2021-11-23 MED ORDER — BRIMONIDINE TARTRATE 0.2 % OP SOLN: 1.0000 [drp] | Freq: Two times a day (BID) | OPHTHALMIC | 1 refills | Status: AC

## 2021-11-23 NOTE — Telephone Encounter (Signed)
  LAST APPOINTMENT DATE:  10/19/21  NEXT APPOINTMENT DATE: 01/06/22-With PCP  MEDICATION:brimonidine (ALPHAGAN) 0.2 % ophthalmic solution   Is the patient out of medication? Yes  PHARMACY:[id number] [id number]: [phone] Fax: [phone]

## 2021-11-24 ENCOUNTER — Ambulatory Visit: Payer: Medicare Other | Admitting: Cardiovascular Disease

## 2021-11-24 ENCOUNTER — Encounter: Payer: Self-pay | Admitting: Family Medicine

## 2021-11-25 DIAGNOSIS — K219 Gastro-esophageal reflux disease without esophagitis: Secondary | ICD-10-CM | POA: Diagnosis not present

## 2021-11-25 DIAGNOSIS — R110 Nausea: Secondary | ICD-10-CM | POA: Diagnosis not present

## 2021-11-26 ENCOUNTER — Other Ambulatory Visit: Payer: Self-pay | Admitting: *Deleted

## 2021-11-26 DIAGNOSIS — Z23 Encounter for immunization: Secondary | ICD-10-CM | POA: Diagnosis not present

## 2021-11-26 NOTE — Patient Outreach (Signed)
[place] Coordinator follow up. Mrs. Arista remains in [facility] SN[facility].   Met with [facility] SW who reports Mrs. Standard will likely transition back to [facility] over the next week or so. Lives with spouse. Went to bedside to speak with Mrs. Hafford. However, she was occupied. Left [facility] literature and writer's contact information at bedside.   Will follow up at later time.   [name], MSN, RN,BSN [place] Acute Care Coordinator [phone] (Direct dial)

## 2021-11-29 ENCOUNTER — Telehealth: Payer: Self-pay | Admitting: Emergency Medicine

## 2021-11-29 DIAGNOSIS — D649 Anemia, unspecified: Secondary | ICD-10-CM | POA: Diagnosis not present

## 2021-11-29 DIAGNOSIS — N1831 Chronic kidney disease, stage 3a: Secondary | ICD-10-CM | POA: Diagnosis not present

## 2021-11-29 DIAGNOSIS — E860 Dehydration: Secondary | ICD-10-CM | POA: Diagnosis not present

## 2021-11-29 MED ORDER — IPRATROPIUM BROMIDE 0.03 % NA SOLN: 2.0000 | Freq: Three times a day (TID) | NASAL | 3 refills | Status: AC | PRN

## 2021-11-29 NOTE — Telephone Encounter (Signed)
Rx for pt's atovent nasal spray has been sent to pharmacy for pt. Called and spoke with pt's daughter Amy letting her know this had been done and she verbalized understanding. Nothing further needed.

## 2021-12-01 ENCOUNTER — Ambulatory Visit: Payer: Medicare Other | Admitting: Podiatry

## 2021-12-01 DIAGNOSIS — R2689 Other abnormalities of gait and mobility: Secondary | ICD-10-CM | POA: Diagnosis not present

## 2021-12-01 DIAGNOSIS — Z9181 History of falling: Secondary | ICD-10-CM | POA: Diagnosis not present

## 2021-12-01 DIAGNOSIS — M6281 Muscle weakness (generalized): Secondary | ICD-10-CM | POA: Diagnosis not present

## 2021-12-01 NOTE — Telephone Encounter (Signed)
Pt is scheduled for 12/15/21

## 2021-12-06 ENCOUNTER — Telehealth: Payer: Self-pay

## 2021-12-06 NOTE — Telephone Encounter (Signed)
Patient daughter called stating the facility "[name]" is residing in needs a signed letter stating she is to stop taking her antihistamine for 3 days before her allergy appointment.  Her appointment is scheduled for 01/12/22 in the [place] office with Dr. Nelva Bush.  Please advise [phone]

## 2021-12-07 ENCOUNTER — Encounter: Payer: Self-pay | Admitting: Family Medicine

## 2021-12-07 DIAGNOSIS — R2681 Unsteadiness on feet: Secondary | ICD-10-CM | POA: Diagnosis not present

## 2021-12-07 DIAGNOSIS — M6281 Muscle weakness (generalized): Secondary | ICD-10-CM | POA: Diagnosis not present

## 2021-12-07 DIAGNOSIS — R2689 Other abnormalities of gait and mobility: Secondary | ICD-10-CM | POA: Diagnosis not present

## 2021-12-08 ENCOUNTER — Telehealth: Payer: Self-pay

## 2021-12-08 DIAGNOSIS — M6281 Muscle weakness (generalized): Secondary | ICD-10-CM | POA: Diagnosis not present

## 2021-12-08 DIAGNOSIS — R2689 Other abnormalities of gait and mobility: Secondary | ICD-10-CM | POA: Diagnosis not present

## 2021-12-08 DIAGNOSIS — R2681 Unsteadiness on feet: Secondary | ICD-10-CM | POA: Diagnosis not present

## 2021-12-08 MED ORDER — ACETAMINOPHEN 325 MG PO TABS: 325.0000 mg | ORAL_TABLET | Freq: Four times a day (QID) | ORAL | 3 refills | Status: AC | PRN

## 2021-12-08 MED ORDER — LUDENS COUGH DROPS 3-60 MG MT LOZG: LOZENGE | OROMUCOSAL | 11 refills | Status: AC

## 2021-12-08 NOTE — Telephone Encounter (Signed)
"  Read previous telephone note"

## 2021-12-08 NOTE — Telephone Encounter (Signed)
Letter is written and will call facility to fax letter over.

## 2021-12-08 NOTE — Telephone Encounter (Signed)
A total of 9 minutes were spent by me to personally review the patient-generated inquiry, review patient records and data pertinent to assessment of the patient's problem, develop a management plan including generation of prescriptions and/or orders, and on subsequent communication with the patient through secure the MyChart portal service. There is no separately reported E/M service related to this service in the past 7 days nor does the patient have an upcoming soonest available appointment for this issue. This work was completed in less than 7 days.      The codes to be used for the E/M service are:  [phone] for five-10 minutes of time spent on the inquiry.

## 2021-12-09 DIAGNOSIS — R2681 Unsteadiness on feet: Secondary | ICD-10-CM | POA: Diagnosis not present

## 2021-12-09 DIAGNOSIS — M6281 Muscle weakness (generalized): Secondary | ICD-10-CM | POA: Diagnosis not present

## 2021-12-09 DIAGNOSIS — R2689 Other abnormalities of gait and mobility: Secondary | ICD-10-CM | POA: Diagnosis not present

## 2021-12-10 ENCOUNTER — Other Ambulatory Visit: Payer: Self-pay | Admitting: *Deleted

## 2021-12-10 ENCOUNTER — Ambulatory Visit: Payer: Medicare Other | Admitting: Allergy

## 2021-12-10 NOTE — Patient Outreach (Signed)
[place] Coordinator follow up. Verified in [facility] Mrs. Bonanza discharged from [facility] on 12/06/21.  Confirmed with [place], [place] SW, Mrs. Raus and spouse transitioned to [facility] with [employer] providing home health services.   No identifiable [facility] care coordination needs.   [name], MSN, RN,BSN [place] Acute Care Coordinator [phone] (Direct dial)

## 2021-12-13 ENCOUNTER — Telehealth: Payer: Self-pay | Admitting: Family Medicine

## 2021-12-13 DIAGNOSIS — R2689 Other abnormalities of gait and mobility: Secondary | ICD-10-CM | POA: Diagnosis not present

## 2021-12-13 DIAGNOSIS — M6281 Muscle weakness (generalized): Secondary | ICD-10-CM | POA: Diagnosis not present

## 2021-12-13 DIAGNOSIS — R2681 Unsteadiness on feet: Secondary | ICD-10-CM | POA: Diagnosis not present

## 2021-12-13 NOTE — Telephone Encounter (Signed)
Robin Harrell (might not be spelled correctly) with [facility] is inquiring about follow up labs for this pt. She stated she recently had labs. She would like a call back please. [phone]

## 2021-12-14 ENCOUNTER — Ambulatory Visit: Payer: Medicare Other

## 2021-12-14 DIAGNOSIS — R2689 Other abnormalities of gait and mobility: Secondary | ICD-10-CM | POA: Diagnosis not present

## 2021-12-14 DIAGNOSIS — R2681 Unsteadiness on feet: Secondary | ICD-10-CM | POA: Diagnosis not present

## 2021-12-14 DIAGNOSIS — M6281 Muscle weakness (generalized): Secondary | ICD-10-CM | POA: Diagnosis not present

## 2021-12-14 NOTE — Telephone Encounter (Signed)
Robin Harrell, has the letter been faxed over for the patient?

## 2021-12-15 ENCOUNTER — Ambulatory Visit (INDEPENDENT_AMBULATORY_CARE_PROVIDER_SITE_OTHER): Payer: Medicare Other

## 2021-12-15 DIAGNOSIS — M810 Age-related osteoporosis without current pathological fracture: Secondary | ICD-10-CM | POA: Diagnosis not present

## 2021-12-15 MED ORDER — DENOSUMAB 60 MG/ML ~~LOC~~ SOSY
60.0000 mg | PREFILLED_SYRINGE | Freq: Once | SUBCUTANEOUS | Status: AC
  Administered 2021-12-15: 60 mg via SUBCUTANEOUS

## 2021-12-15 NOTE — Progress Notes (Signed)
Administered PROLIA 60 mg/mL subcutaneous left arm per Billey Chang, MD. Patient tolerated well.

## 2021-12-16 DIAGNOSIS — R2681 Unsteadiness on feet: Secondary | ICD-10-CM | POA: Diagnosis not present

## 2021-12-16 DIAGNOSIS — M6281 Muscle weakness (generalized): Secondary | ICD-10-CM | POA: Diagnosis not present

## 2021-12-16 DIAGNOSIS — R2689 Other abnormalities of gait and mobility: Secondary | ICD-10-CM | POA: Diagnosis not present

## 2021-12-17 DIAGNOSIS — R2681 Unsteadiness on feet: Secondary | ICD-10-CM | POA: Diagnosis not present

## 2021-12-17 DIAGNOSIS — M6281 Muscle weakness (generalized): Secondary | ICD-10-CM | POA: Diagnosis not present

## 2021-12-17 DIAGNOSIS — R2689 Other abnormalities of gait and mobility: Secondary | ICD-10-CM | POA: Diagnosis not present

## 2021-12-20 ENCOUNTER — Ambulatory Visit: Payer: Medicare Other | Admitting: Cardiovascular Disease

## 2021-12-20 ENCOUNTER — Encounter: Payer: Self-pay | Admitting: Cardiovascular Disease

## 2021-12-20 NOTE — Telephone Encounter (Signed)
I have tried on numerous occasions to contact Robin Harrell but mailbox has been full.

## 2021-12-21 ENCOUNTER — Ambulatory Visit: Payer: Medicare Other | Attending: Cardiovascular Disease | Admitting: Cardiovascular Disease

## 2021-12-21 ENCOUNTER — Encounter: Payer: Self-pay | Admitting: Cardiovascular Disease

## 2021-12-21 ENCOUNTER — Ambulatory Visit (INDEPENDENT_AMBULATORY_CARE_PROVIDER_SITE_OTHER): Payer: Medicare Other | Admitting: *Deleted

## 2021-12-21 DIAGNOSIS — Z5181 Encounter for therapeutic drug level monitoring: Secondary | ICD-10-CM | POA: Insufficient documentation

## 2021-12-21 DIAGNOSIS — D682 Hereditary deficiency of other clotting factors: Secondary | ICD-10-CM | POA: Diagnosis not present

## 2021-12-21 DIAGNOSIS — Z7901 Long term (current) use of anticoagulants: Secondary | ICD-10-CM | POA: Diagnosis not present

## 2021-12-21 DIAGNOSIS — I350 Nonrheumatic aortic (valve) stenosis: Secondary | ICD-10-CM | POA: Diagnosis not present

## 2021-12-21 DIAGNOSIS — R2689 Other abnormalities of gait and mobility: Secondary | ICD-10-CM | POA: Diagnosis not present

## 2021-12-21 DIAGNOSIS — M6281 Muscle weakness (generalized): Secondary | ICD-10-CM | POA: Diagnosis not present

## 2021-12-21 DIAGNOSIS — I050 Rheumatic mitral stenosis: Secondary | ICD-10-CM | POA: Insufficient documentation

## 2021-12-21 DIAGNOSIS — I10 Essential (primary) hypertension: Secondary | ICD-10-CM | POA: Insufficient documentation

## 2021-12-21 DIAGNOSIS — Z79899 Other long term (current) drug therapy: Secondary | ICD-10-CM | POA: Diagnosis not present

## 2021-12-21 DIAGNOSIS — I5032 Chronic diastolic (congestive) heart failure: Secondary | ICD-10-CM | POA: Diagnosis not present

## 2021-12-21 DIAGNOSIS — R2681 Unsteadiness on feet: Secondary | ICD-10-CM | POA: Diagnosis not present

## 2021-12-21 LAB — POCT INR: POC INR: 5.9

## 2021-12-21 NOTE — Assessment & Plan Note (Signed)
2D echocardiogram performed 09/06/2021 revealed severe mitral stenosis with a valve area of 0.56 cm and a peak/mean gradient of 27/9 mmHg.  She is not a candidate for surgical intervention.

## 2021-12-21 NOTE — Assessment & Plan Note (Signed)
History of factor V Leiden deficiency with DVT in the past on lifelong Coumadin anticoagulation.

## 2021-12-21 NOTE — Patient Instructions (Signed)
Description   Hold warfarin 10/31, 11/1, and 11/2 The START taking warfarin 1/2 a tablet daily except for 1 tablet on Tuesday, Thursday and Saturday. Recheck INR in 1 wk.  [phone]

## 2021-12-21 NOTE — Assessment & Plan Note (Signed)
History of chronic diastolic heart failure with echo performed 09/06/2021 revealing grade 3 diastolic dysfunction.  She is on furosemide.

## 2021-12-21 NOTE — Assessment & Plan Note (Signed)
History of severe aortic stenosis with recent 2D echo performed 09/06/2021 revealing an aortic valve area of 0.62 cm with low gradient AS.  She is really minimally symptomatic but minimally ambulatory as well.  I do not think she is a candidate for an invasive procedure and she does not want 1.  She wishes to be a DNR.

## 2021-12-21 NOTE — Patient Instructions (Signed)
Medication Instructions:  Your physician recommends that you continue on your current medications as directed. Please refer to the Current Medication list given to you today.  *If you need a refill on your cardiac medications before your next appointment, please call your pharmacy*   Follow-Up: At [facility], you and your health needs are our priority.  As part of our continuing mission to provide you with exceptional heart care, we have created designated Provider Care Teams.  These Care Teams include your primary Cardiologist (physician) and Advanced Practice Providers (APPs -  Physician Assistants and Nurse Practitioners) who all work together to provide you with the care you need, when you need it.  We recommend signing up for the patient portal called "MyChart".  Sign up information is provided on this After Visit Summary.  MyChart is used to connect with patients for Virtual Visits (Telemedicine).  Patients are able to view lab/test results, encounter notes, upcoming appointments, etc.  Non-urgent messages can be sent to your provider as well.   To learn more about what you can do with MyChart, go to [email or web address].    Your next appointment:   12 month(s)  The format for your next appointment:   In Person  Provider:   Jonathan Berry, MD   

## 2021-12-21 NOTE — Assessment & Plan Note (Signed)
History of essential hypertension a blood pressure measured today at 110/60.  She is on metoprolol.

## 2021-12-21 NOTE — Progress Notes (Signed)
12/21/2021 Robin Harrell [facility]   02/07/1934  [id number]  Primary Physician [name], MD Primary Cardiologist: Lorretta Harp MD FACP, [place], [place], [place]  HPI:  Robin Harrell is a 88 y.o.  thin-appearing, married Caucasian female, mother of 44, grandmother to 4 grandchildren who I last saw in the office on 10/14/2020.  She is accompanied by her caregiver Noella today.  She is currently living with her husband in an assisted care at [facility].  She has a history of treated hypertension, hyperlipidemia, and GERD. She does have a history of pulmonary embolism in the past with factor V Leiden deficiency on life-long Coumadin anticoagulation. She has had a normal 2D echo and Myoview back in 2009. She was recently admitted to [facility] for 2 weeks with acute respiratory insufficiency requiring intubation related to pneumonia and RSV. There was a thought that she may have had congestive heart failure as well, though a 2D echo was normal. She did have a Holter monitor that showed a large number of bigeminal PVCs which she is asymptomatic from and currently is on low-dose beta blocker.    She was hospitalized at [facility] 02/25/2019 for approximately a week with viral pneumonitis secondary to COVID-19 pneumonia.  Her husband was COVID-19 positive as well but he was relatively asymptomatic.  She has recovered from this.  Since I saw her a year a year ago she is remained stable.  She is minimally ambulatory and denies chest pain or shortness of breath.  Recent 2D echo performed 09/06/2021 revealed severe aortic stenosis and mitral stenosis.  We talked about advanced directives and she wishes to be a DNR.   Current Meds  Medication Sig   acetaminophen (TYLENOL) 325 MG tablet Take 1-2 tablets (325-650 mg total) by mouth every 6 (six) hours as needed for mild pain (or Fever >/= 101).   atorvastatin (LIPITOR) 10 MG tablet TAKE 1 TABLET (10 MG TOTAL) BY MOUTH DAILY AT 6 PM.  (Patient taking differently: Take 10 mg by mouth daily.)   brimonidine (ALPHAGAN) 0.2 % ophthalmic solution Place 1 drop into both eyes 2 (two) times daily.   Calcium-Phosphorus-Vitamin D (CALCIUM/D3 ADULT GUMMIES) 200-96.6-200 MG-MG-UNIT CHEW Chew 2 tablets by mouth daily.   chlorpheniramine (CHLOR-TRIMETON) 4 MG tablet Take 4 mg by mouth 2 (two) times daily.   fluticasone (FLONASE) 50 MCG/ACT nasal spray SPRAY 2 SPRAYS INTO EACH NOSTRIL EVERY DAY (Patient taking differently: Place 2 sprays into both nostrils daily.)   furosemide (LASIX) 20 MG tablet Take 1 tablet (20 mg total) by mouth daily as needed for fluid.   guaiFENesin (MUCINEX) 600 MG 12 hr tablet Take 1 tablet (600 mg total) by mouth 2 (two) times daily.   ipratropium (ATROVENT) 0.03 % nasal spray Place 2 sprays into both nostrils 3 (three) times daily as needed for rhinitis.   latanoprost (XALATAN) 0.005 % ophthalmic solution Place 1 drop into both eyes at bedtime.    levocetirizine (XYZAL) 5 MG tablet TAKE 1 TABLET BY MOUTH EVERY DAY IN THE EVENING (Patient taking differently: Take 5 mg by mouth every evening.)   levothyroxine (SYNTHROID) 25 MCG tablet Take 1 tablet (25 mcg total) by mouth daily before breakfast.   Menthol-Ascorbic Acid (LUDENS COUGH DROPS) 3-60 MG LOZG Take 1 every 2-4 hours as needed   metoprolol tartrate (LOPRESSOR) 25 MG tablet Take 1 tablet (25 mg total) by mouth 2 (two) times daily.   multivitamin-iron-minerals-folic acid (CENTRUM) chewable tablet Chew 1  tablet by mouth daily.   warfarin (COUMADIN) 7.5 MG tablet 1/2 tab M,W, F and 1 tablet on T,Th,S,Su (Patient taking differently: Take 3.75-7.5 mg by mouth See admin instructions. Take 1/2 tablet (3.75 mg) on Mon,Wed, and Fri. THEN Take 1 tablet (7.5 mg) on Tues, Thurs, Sat, and Sun)     Allergies  Allergen Reactions   Azithromycin Nausea Only   Codeine Nausea Only   Erythromycin Nausea And Vomiting    Social History   Socioeconomic History   Marital  status: Married    Spouse name: Not on file   Number of children: 4   Years of education: 13.5   Highest education level: Not on file  Occupational History   Occupation: Network engineer - retired  Tobacco Use   Smoking status: Former    Years: 1.00    Types: Cigarettes    Quit date: 02/21/1958    Years since quitting: 63.8   Smokeless tobacco: Never  Vaping Use   Vaping Use: Never used  Substance and Sexual Activity   Alcohol use: Yes    Comment: rareley one glass of wine   Drug use: No   Sexual activity: Never  Other Topics Concern   Not on file  Social History Narrative   Not on file   Social Determinants of Health   Financial Resource Strain: Low Risk  (12/25/2020)   Overall Financial Resource Strain (CARDIA)    Difficulty of Paying Living Expenses: Not hard at all  Food Insecurity: No Food Insecurity (10/26/2021)   Hunger Vital Sign    Worried About Running Out of Food in the Last Year: Never true    [place] in the Last Year: Never true  Transportation Needs: No Transportation Needs (10/26/2021)   PRAPARE - Hydrologist (Medical): No    Lack of Transportation (Non-Medical): No  Physical Activity: Inactive (12/25/2020)   Exercise Vital Sign    Days of Exercise per Week: 0 days    Minutes of Exercise per Session: 0 min  Stress: Stress Concern Present (12/25/2020)   [place]    Feeling of Stress : To some extent  Social Connections: Moderately Integrated (12/25/2020)   Social Connection and Isolation Panel [NHANES]    Frequency of Communication with Friends and Family: More than three times a week    Frequency of Social Gatherings with Friends and Family: More than three times a week    Attends Religious Services: More than 4 times per year    Active Member of [employer] or Organizations: No    Attends Archivist Meetings: Never    Marital Status: Married  Arboriculturist Violence: Not At Risk (10/26/2021)   Humiliation, Afraid, Rape, and Kick questionnaire    Fear of Current or Ex-Partner: No    Emotionally Abused: No    Physically Abused: No    Sexually Abused: No     Review of Systems: General: negative for chills, fever, night sweats or weight changes.  Cardiovascular: negative for chest pain, dyspnea on exertion, edema, orthopnea, palpitations, paroxysmal nocturnal dyspnea or shortness of breath Dermatological: negative for rash Respiratory: negative for cough or wheezing Urologic: negative for hematuria Abdominal: negative for nausea, vomiting, diarrhea, bright red blood per rectum, melena, or hematemesis Neurologic: negative for visual changes, syncope, or dizziness All other systems reviewed and are otherwise negative except as noted above.    Blood pressure 110/60, pulse 80, height  $'5\' 3"'v$  (1.6 m), weight 121 lb (54.9 kg).  General appearance: alert and no distress Neck: no adenopathy, no JVD, supple, symmetrical, trachea midline, thyroid not enlarged, symmetric, no tenderness/mass/nodules, and bilateral carotid bruits versus transmitted murmur Lungs: clear to auscultation bilaterally Heart: 3/6 outflow tract murmur consistent with severe aortic stenosis Extremities: extremities normal, atraumatic, no cyanosis or edema Pulses: 2+ and symmetric Skin: Skin color, texture, turgor normal. No rashes or lesions Neurologic: Grossly normal  EKG not performed today  ASSESSMENT AND PLAN:   Essential hypertension History of essential hypertension a blood pressure measured today at 110/60.  She is on metoprolol.  Factor V deficiency ([place]) History of factor V Leiden deficiency with DVT in the past on lifelong Coumadin anticoagulation.  Severe aortic stenosis History of severe aortic stenosis with recent 2D echo performed 09/06/2021 revealing an aortic valve area of 0.62 cm with low gradient AS.  She is really minimally symptomatic but  minimally ambulatory as well.  I do not think she is a candidate for an invasive procedure and she does not want 1.  She wishes to be a DNR.  Chronic diastolic CHF (congestive heart failure) (HCC) History of chronic diastolic heart failure with echo performed 09/06/2021 revealing grade 3 diastolic dysfunction.  She is on furosemide.  Severe mitral stenosis by prior echocardiogram 2D echocardiogram performed 09/06/2021 revealed severe mitral stenosis with a valve area of 0.56 cm and a peak/mean gradient of 27/9 mmHg.  She is not a candidate for surgical intervention.     Lorretta Harp MD FACP,FACC,FAHA, [facility] 12/21/2021 3:04 PM

## 2021-12-23 DIAGNOSIS — R2689 Other abnormalities of gait and mobility: Secondary | ICD-10-CM | POA: Diagnosis not present

## 2021-12-23 DIAGNOSIS — R2681 Unsteadiness on feet: Secondary | ICD-10-CM | POA: Diagnosis not present

## 2021-12-23 DIAGNOSIS — M6281 Muscle weakness (generalized): Secondary | ICD-10-CM | POA: Diagnosis not present

## 2021-12-24 DIAGNOSIS — R2681 Unsteadiness on feet: Secondary | ICD-10-CM | POA: Diagnosis not present

## 2021-12-24 DIAGNOSIS — M6281 Muscle weakness (generalized): Secondary | ICD-10-CM | POA: Diagnosis not present

## 2021-12-24 DIAGNOSIS — R2689 Other abnormalities of gait and mobility: Secondary | ICD-10-CM | POA: Diagnosis not present

## 2021-12-27 NOTE — Telephone Encounter (Signed)
Sorry Educational psychologist, this has been taken care of.

## 2021-12-29 ENCOUNTER — Ambulatory Visit: Payer: Medicare Other | Attending: Cardiology

## 2021-12-29 ENCOUNTER — Other Ambulatory Visit: Payer: Self-pay

## 2021-12-29 DIAGNOSIS — Z5181 Encounter for therapeutic drug level monitoring: Secondary | ICD-10-CM | POA: Diagnosis not present

## 2021-12-29 DIAGNOSIS — R2681 Unsteadiness on feet: Secondary | ICD-10-CM | POA: Diagnosis not present

## 2021-12-29 DIAGNOSIS — Z7901 Long term (current) use of anticoagulants: Secondary | ICD-10-CM

## 2021-12-29 DIAGNOSIS — R197 Diarrhea, unspecified: Secondary | ICD-10-CM

## 2021-12-29 DIAGNOSIS — R2689 Other abnormalities of gait and mobility: Secondary | ICD-10-CM | POA: Diagnosis not present

## 2021-12-29 DIAGNOSIS — M6281 Muscle weakness (generalized): Secondary | ICD-10-CM | POA: Diagnosis not present

## 2021-12-29 LAB — POCT INR: INR: 2.6 (ref 2.0–3.0)

## 2021-12-29 MED ORDER — LOPERAMIDE HCL 1 MG/7.5ML PO SOLN: 2.0000 mg | ORAL | 3 refills | Status: AC | PRN

## 2021-12-29 NOTE — Telephone Encounter (Signed)
Last prolia 12/15/21 Next prolia inj due 06/16/21

## 2021-12-29 NOTE — Patient Instructions (Signed)
Description   START taking warfarin 1/2 a tablet daily except for 1 tablet on Sundays.  Recheck INR in 1 wk.  [facility] [phone] Please call [place] with instruction [phone]

## 2021-12-30 DIAGNOSIS — M6281 Muscle weakness (generalized): Secondary | ICD-10-CM | POA: Diagnosis not present

## 2021-12-30 DIAGNOSIS — R2689 Other abnormalities of gait and mobility: Secondary | ICD-10-CM | POA: Diagnosis not present

## 2021-12-30 DIAGNOSIS — R2681 Unsteadiness on feet: Secondary | ICD-10-CM | POA: Diagnosis not present

## 2021-12-31 ENCOUNTER — Ambulatory Visit: Payer: Medicare Other | Admitting: Podiatrist

## 2021-12-31 DIAGNOSIS — R2689 Other abnormalities of gait and mobility: Secondary | ICD-10-CM | POA: Diagnosis not present

## 2021-12-31 DIAGNOSIS — M6281 Muscle weakness (generalized): Secondary | ICD-10-CM | POA: Diagnosis not present

## 2021-12-31 DIAGNOSIS — R2681 Unsteadiness on feet: Secondary | ICD-10-CM | POA: Diagnosis not present

## 2022-01-03 DIAGNOSIS — Z86718 Personal history of other venous thrombosis and embolism: Secondary | ICD-10-CM | POA: Diagnosis not present

## 2022-01-03 DIAGNOSIS — I050 Rheumatic mitral stenosis: Secondary | ICD-10-CM | POA: Diagnosis not present

## 2022-01-03 DIAGNOSIS — D682 Hereditary deficiency of other clotting factors: Secondary | ICD-10-CM | POA: Diagnosis not present

## 2022-01-03 DIAGNOSIS — I110 Hypertensive heart disease with heart failure: Secondary | ICD-10-CM | POA: Diagnosis not present

## 2022-01-03 DIAGNOSIS — I10 Essential (primary) hypertension: Secondary | ICD-10-CM | POA: Diagnosis not present

## 2022-01-03 DIAGNOSIS — I5189 Other ill-defined heart diseases: Secondary | ICD-10-CM | POA: Diagnosis not present

## 2022-01-03 DIAGNOSIS — E039 Hypothyroidism, unspecified: Secondary | ICD-10-CM | POA: Diagnosis not present

## 2022-01-04 DIAGNOSIS — R2689 Other abnormalities of gait and mobility: Secondary | ICD-10-CM | POA: Diagnosis not present

## 2022-01-04 DIAGNOSIS — M6281 Muscle weakness (generalized): Secondary | ICD-10-CM | POA: Diagnosis not present

## 2022-01-04 DIAGNOSIS — R2681 Unsteadiness on feet: Secondary | ICD-10-CM | POA: Diagnosis not present

## 2022-01-06 ENCOUNTER — Ambulatory Visit (INDEPENDENT_AMBULATORY_CARE_PROVIDER_SITE_OTHER): Payer: Medicare Other

## 2022-01-06 ENCOUNTER — Ambulatory Visit: Payer: Medicare Other | Attending: Cardiology | Admitting: *Deleted

## 2022-01-06 DIAGNOSIS — Z86718 Personal history of other venous thrombosis and embolism: Secondary | ICD-10-CM

## 2022-01-06 DIAGNOSIS — Z7901 Long term (current) use of anticoagulants: Secondary | ICD-10-CM

## 2022-01-06 DIAGNOSIS — Z0000 Encounter for general adult medical examination without abnormal findings: Secondary | ICD-10-CM

## 2022-01-06 LAB — POCT INR: INR: 3.3 — AB (ref 2.0–3.0)

## 2022-01-06 NOTE — Patient Instructions (Signed)
[name] , Thank you for taking time to come for your Medicare Wellness Visit. I appreciate your ongoing commitment to your health goals. Please review the following plan we discussed and let me know if I can assist you in the future.   These are the goals we discussed:  Goals      Patient Stated     Start [place] chi again, improve balance.      Patient Stated     Walk with out cane      Patient Stated     None at this time         This is a list of the screening recommended for you and due dates:  Health Maintenance  Topic Date Due   COVID-19 Vaccine (4 - Pfizer series) 04/24/2021   Flu Shot  09/21/2021   DEXA scan (bone density measurement)  06/09/2022   Medicare Annual Wellness Visit  01/07/2023   Pneumonia Vaccine  Completed   Zoster (Shingles) Vaccine  Completed   HPV Vaccine  Aged Out   Tetanus Vaccine  Discontinued    Advanced directives: copies in chart   Conditions/risks identified: stay healthy and active   Next appointment: Follow up in one year for your annual wellness visit   Preventive Care 65 Years and Older, Female Preventive care refers to lifestyle choices and visits with your health care provider that can promote health and wellness. What does preventive care include? A yearly physical exam. This is also called an annual well check. Dental exams once or twice a year. Routine eye exams. Ask your health care provider how often you should have your eyes checked. Personal lifestyle choices, including: Daily care of your teeth and gums. Regular physical activity. Eating a healthy diet. Avoiding tobacco and drug use. Limiting alcohol use. Practicing safe sex. Taking low-dose aspirin every day. Taking vitamin and mineral supplements as recommended by your health care provider. What happens during an annual well check? The services and screenings done by your health care provider during your annual well check will depend on your age, overall health,  lifestyle risk factors, and family history of disease. Counseling  Your health care provider may ask you questions about your: Alcohol use. Tobacco use. Drug use. Emotional well-being. Home and relationship well-being. Sexual activity. Eating habits. History of falls. Memory and ability to understand (cognition). Work and work Statistician. Reproductive health. Screening  You may have the following tests or measurements: Height, weight, and BMI. Blood pressure. Lipid and cholesterol levels. These may be checked every 5 years, or more frequently if you are over 19 years old. Skin check. Lung cancer screening. You may have this screening every year starting at age 80 if you have a 30-pack-year history of smoking and currently smoke or have quit within the past 15 years. Fecal occult blood test (FOBT) of the stool. You may have this test every year starting at age 28. Flexible sigmoidoscopy or colonoscopy. You may have a sigmoidoscopy every 5 years or a colonoscopy every 10 years starting at age 34. Hepatitis C blood test. Hepatitis B blood test. Sexually transmitted disease (STD) testing. Diabetes screening. This is done by checking your blood sugar (glucose) after you have not eaten for a while (fasting). You may have this done every 1-3 years. Bone density scan. This is done to screen for osteoporosis. You may have this done starting at age 6. Mammogram. This may be done every 1-2 years. Talk to your health care provider about how  often you should have regular mammograms. Talk with your health care provider about your test results, treatment options, and if necessary, the need for more tests. Vaccines  Your health care provider may recommend certain vaccines, such as: Influenza vaccine. This is recommended every year. Tetanus, diphtheria, and acellular pertussis (Tdap, Td) vaccine. You may need a Td booster every 10 years. Zoster vaccine. You may need this after age  74. Pneumococcal 13-valent conjugate (PCV13) vaccine. One dose is recommended after age 61. Pneumococcal polysaccharide (PPSV23) vaccine. One dose is recommended after age 70. Talk to your health care provider about which screenings and vaccines you need and how often you need them. This information is not intended to replace advice given to you by your health care provider. Make sure you discuss any questions you have with your health care provider. Document Released: 03/06/2015 Document Revised: 10/28/2015 Document Reviewed: 12/09/2014 Elsevier Interactive Patient Education  2017 [place] Prevention in the Home Falls can cause injuries. They can happen to people of all ages. There are many things you can do to make your home safe and to help prevent falls. What can I do on the outside of my home? Regularly fix the edges of walkways and driveways and fix any cracks. Remove anything that might make you trip as you walk through a door, such as a raised step or threshold. Trim any bushes or trees on the path to your home. Use bright outdoor lighting. Clear any walking paths of anything that might make someone trip, such as rocks or tools. Regularly check to see if handrails are loose or broken. Make sure that both sides of any steps have handrails. Any raised decks and porches should have guardrails on the edges. Have any leaves, snow, or ice cleared regularly. Use sand or salt on walking paths during winter. Clean up any spills in your garage right away. This includes oil or grease spills. What can I do in the bathroom? Use night lights. Install grab bars by the toilet and in the tub and shower. Do not use towel bars as grab bars. Use non-skid mats or decals in the tub or shower. If you need to sit down in the shower, use a plastic, non-slip stool. Keep the floor dry. Clean up any water that spills on the floor as soon as it happens. Remove soap buildup in the tub or shower  regularly. Attach bath mats securely with double-sided non-slip rug tape. Do not have throw rugs and other things on the floor that can make you trip. What can I do in the bedroom? Use night lights. Make sure that you have a light by your bed that is easy to reach. Do not use any sheets or blankets that are too big for your bed. They should not hang down onto the floor. Have a firm chair that has side arms. You can use this for support while you get dressed. Do not have throw rugs and other things on the floor that can make you trip. What can I do in the kitchen? Clean up any spills right away. Avoid walking on wet floors. Keep items that you use a lot in easy-to-reach places. If you need to reach something above you, use a strong step stool that has a grab bar. Keep electrical cords out of the way. Do not use floor polish or wax that makes floors slippery. If you must use wax, use non-skid floor wax. Do not have throw rugs and other things  on the floor that can make you trip. What can I do with my stairs? Do not leave any items on the stairs. Make sure that there are handrails on both sides of the stairs and use them. Fix handrails that are broken or loose. Make sure that handrails are as long as the stairways. Check any carpeting to make sure that it is firmly attached to the stairs. Fix any carpet that is loose or worn. Avoid having throw rugs at the top or bottom of the stairs. If you do have throw rugs, attach them to the floor with carpet tape. Make sure that you have a light switch at the top of the stairs and the bottom of the stairs. If you do not have them, ask someone to add them for you. What else can I do to help prevent falls? Wear shoes that: Do not have high heels. Have rubber bottoms. Are comfortable and fit you well. Are closed at the toe. Do not wear sandals. If you use a stepladder: Make sure that it is fully opened. Do not climb a closed stepladder. Make sure that  both sides of the stepladder are locked into place. Ask someone to hold it for you, if possible. Clearly mark and make sure that you can see: Any grab bars or handrails. First and last steps. Where the edge of each step is. Use tools that help you move around (mobility aids) if they are needed. These include: Canes. Walkers. Scooters. Crutches. Turn on the lights when you go into a dark area. Replace any light bulbs as soon as they burn out. Set up your furniture so you have a clear path. Avoid moving your furniture around. If any of your floors are uneven, fix them. If there are any pets around you, be aware of where they are. Review your medicines with your doctor. Some medicines can make you feel dizzy. This can increase your chance of falling. Ask your doctor what other things that you can do to help prevent falls. This information is not intended to replace advice given to you by your health care provider. Make sure you discuss any questions you have with your health care provider. Document Released: 12/04/2008 Document Revised: 07/16/2015 Document Reviewed: 03/14/2014 Elsevier Interactive Patient Education  2017 [employer].

## 2022-01-06 NOTE — Progress Notes (Signed)
I connected with  Robin Harrell on 01/06/22 by a audio enabled telemedicine application and verified that I am speaking with the correct person using two identifiers.  Patient Location: Home  Provider Location: Office/Clinic  I discussed the limitations of evaluation and management by telemedicine. The patient expressed understanding and agreed to proceed.   Subjective:   Robin Harrell is a 88 y.o. female who presents for Medicare Annual (Subsequent) preventive examination.  Review of Systems     Cardiac Risk Factors include: advanced age (>67mn, >>47women);hypertension;dyslipidemia     Objective:    There were no vitals filed for this visit. There is no height or weight on file to calculate BMI.     01/06/2022   11:29 AM 10/26/2021    3:32 PM 10/26/2021    3:30 PM 12/25/2020   11:11 AM 08/27/2020    1:10 PM 12/05/2019    2:16 PM 02/26/2019   10:00 PM  Advanced Directives  Does Patient Have a Medical Advance Directive? Yes  Yes Yes Yes Yes No  Type of [name]  Healthcare Power of Attorney Living will;[name]   Does patient want to make changes to medical advance directive? No - Patient declined No - Patient declined   No - Patient declined    Copy of HCrookstonin Chart? Yes - validated most recent copy scanned in chart (See row information)   Yes - validated most recent copy scanned in chart (See row information) Yes - validated most recent copy scanned in chart (See row information) Yes - validated most recent copy scanned in chart (See row information)   Would patient like information on creating a medical advance directive?       No - Patient declined    Current Medications (verified) Outpatient Encounter Medications as of 01/06/2022  Medication Sig   acetaminophen (TYLENOL) 325 MG tablet Take 1-2 tablets (325-650 mg total) by  mouth every 6 (six) hours as needed for mild pain (or Fever >/= 101).   atorvastatin (LIPITOR) 10 MG tablet TAKE 1 TABLET (10 MG TOTAL) BY MOUTH DAILY AT 6 PM. (Patient taking differently: Take 10 mg by mouth daily.)   brimonidine (ALPHAGAN) 0.2 % ophthalmic solution Place 1 drop into both eyes 2 (two) times daily.   Calcium-Phosphorus-Vitamin D (CALCIUM/D3 ADULT GUMMIES) 200-96.6-200 MG-MG-UNIT CHEW Chew 2 tablets by mouth daily.   fluticasone (FLONASE) 50 MCG/ACT nasal spray SPRAY 2 SPRAYS INTO EACH NOSTRIL EVERY DAY (Patient taking differently: Place 2 sprays into both nostrils daily.)   ipratropium (ATROVENT) 0.03 % nasal spray Place 2 sprays into both nostrils 3 (three) times daily as needed for rhinitis.   latanoprost (XALATAN) 0.005 % ophthalmic solution Place 1 drop into both eyes at bedtime.    levocetirizine (XYZAL) 5 MG tablet TAKE 1 TABLET BY MOUTH EVERY DAY IN THE EVENING (Patient taking differently: Take 5 mg by mouth every evening.)   levothyroxine (SYNTHROID) 25 MCG tablet Take 1 tablet (25 mcg total) by mouth daily before breakfast.   Menthol-Ascorbic Acid (LUDENS COUGH DROPS) 3-60 MG LOZG Take 1 every 2-4 hours as needed   metoprolol tartrate (LOPRESSOR) 25 MG tablet Take 1 tablet (25 mg total) by mouth 2 (two) times daily.   multivitamin-iron-minerals-folic acid (CENTRUM) chewable tablet Chew 1 tablet by mouth daily.   ondansetron (ZOFRAN) 4 MG tablet Take 4 mg by mouth every 8 (eight) hours as  needed for nausea or vomiting.   warfarin (COUMADIN) 7.5 MG tablet 1/2 tab M,W, F and 1 tablet on T,Th,S,Su (Patient taking differently: Take 3.75-7.5 mg by mouth See admin instructions. Take 1/2 tablet (3.75 mg) on Mon,Wed, and Fri. THEN Take 1 tablet (7.5 mg) on Tues, Thurs, Sat, and Sun)   chlorpheniramine (CHLOR-TRIMETON) 4 MG tablet Take 4 mg by mouth 2 (two) times daily. (Patient not taking: Reported on 01/06/2022)   furosemide (LASIX) 20 MG tablet Take 1 tablet (20 mg total) by mouth  daily as needed for fluid. (Patient not taking: Reported on 01/06/2022)   loperamide HCl (IMODIUM A-D) 1 MG/7.5ML solution Take 15 mLs (2 mg total) by mouth as needed for diarrhea or loose stools. (Patient not taking: Reported on 01/06/2022)   [DISCONTINUED] guaiFENesin (MUCINEX) 600 MG 12 hr tablet Take 1 tablet (600 mg total) by mouth 2 (two) times daily.   No facility-administered encounter medications on file as of 01/06/2022.    Allergies (verified) Azithromycin, Codeine, and Erythromycin   History: Past Medical History:  Diagnosis Date   Allergic rhinitis    Cough    Difficulty in swallowing    w/ occasional aspiration   DVT (deep venous thrombosis) (HCC)    Dyslipidemia    Factor V Leiden deficiency    lifelong coumadin   GERD (gastroesophageal reflux disease)    History of nuclear stress test 12/28/2007   lexiscan; low risk    HTN (hypertension)    PND (post-nasal drip)    Pneumonia due to COVID-19 virus 02/26/2019   02/25/2019-SARS-CoV-2-positive Hospitlized in [name]/2021 - [facility] -received IV steroids and remdesivir, did not appear to require mechanical ventilation   Pulmonary embolism (HCC)    PVC's (premature ventricular contractions)    Thyroid disease    Past Surgical History:  Procedure Laterality Date   APPENDECTOMY     BUNIONECTOMY     CATARACT EXTRACTION W/ INTRAOCULAR LENS IMPLANT Right 11/02/2013   CHOLECYSTECTOMY  1973   ESOPHAGEAL DILATION     HAMMER TOE SURGERY     history of sleep study  12/27/2007   AHI during total sleep 0.9/hr and REM 1.5/hr; RDI during total sleep6.0/hr and REM 6.1/hr   REPLACEMENT TOTAL KNEE  01/11/2002   right   TRANSTHORACIC ECHOCARDIOGRAM  12/28/2007   borderline conc LVH with normal systolic function; MV mod thickened with mild MVP & mild MR   Family History  Problem Relation Age of Onset   Lung cancer Father    Cirrhosis Father    Coronary artery disease Maternal Grandfather        MI   Throat cancer Maternal Grandfather     Heart disease Paternal Grandmother    Coronary artery disease Paternal Grandfather        MI   Throat cancer Paternal Grandfather    Allergic rhinitis Sister    Skin cancer Brother    Breast cancer Daughter 93   Social History   Socioeconomic History   Marital status: Married    Spouse name: Not on file   Number of children: 4   Years of education: 13.5   Highest education level: Not on file  Occupational History   Occupation: Network engineer - retired  Tobacco Use   Smoking status: Former    Years: 1.00    Types: Cigarettes    Quit date: 02/21/1958    Years since quitting: 63.9   Smokeless tobacco: Never  Vaping Use   Vaping Use: Never used  Substance and Sexual  Activity   Alcohol use: Yes    Comment: rareley one glass of wine   Drug use: No   Sexual activity: Never  Other Topics Concern   Not on file  Social History Narrative   Not on file   Social Determinants of Health   Financial Resource Strain: Low Risk  (01/06/2022)   Overall Financial Resource Strain (CARDIA)    Difficulty of Paying Living Expenses: Not hard at all  Food Insecurity: No Food Insecurity (01/06/2022)   Hunger Vital Sign    Worried About Running Out of Food in the Last Year: Never true    Ran Out of Food in the Last Year: Never true  Transportation Needs: No Transportation Needs (01/06/2022)   PRAPARE - Hydrologist (Medical): No    Lack of Transportation (Non-Medical): No  Physical Activity: Insufficiently Active (01/06/2022)   Exercise Vital Sign    Days of Exercise per Week: 3 days    Minutes of Exercise per Session: 40 min  Stress: No Stress Concern Present (01/06/2022)   [place]    Feeling of Stress : Not at all  Social Connections: Moderately Integrated (01/06/2022)   Social Connection and Isolation Panel [NHANES]    Frequency of Communication with Friends and Family: More than three times a  week    Frequency of Social Gatherings with Friends and Family: More than three times a week    Attends Religious Services: More than 4 times per year    Active Member of [employer] or Organizations: No    Attends Music therapist: Never    Marital Status: Married    Tobacco Counseling Counseling given: Not Answered   Clinical Intake:  Pre-visit preparation completed: Yes  Pain : No/denies pain     Nutritional Risks: None Diabetes: No  How often do you need to have someone help you when you read instructions, pamphlets, or other written materials from your doctor or pharmacy?: 1 - Never  Diabetic?no  Interpreter Needed?: No  Information entered by :: Charlott Rakes, LPN   Activities of Daily Living    01/06/2022   11:30 AM 10/26/2021    3:00 PM  In your present state of health, do you have any difficulty performing the following activities:  Hearing? 0 0  Vision? 0 0  Difficulty concentrating or making decisions? 0 0  Walking or climbing stairs? 0 1  Dressing or bathing? 0 1  Doing errands, shopping? 0 1  Preparing Food and eating ? N   Using the Toilet? N   In the past six months, have you accidently leaked urine? N   Do you have problems with loss of bowel control? N   Managing your Medications? N   Managing your Finances? N   Housekeeping or managing your Housekeeping? N     Patient Care Team: [name], MD as PCP - General (Family Medicine) Lorretta Harp, MD as PCP - Cardiology (Cardiology) Collene Gobble, MD as Consulting Physician (Pulmonary Disease) Lorretta Harp, MD as Consulting Physician (Cardiology) Harriett Sine, MD as Consulting Physician (Dermatology) Katy Apo, MD as Consulting Physician (Ophthalmology) Marzetta Board, DPM as Consulting Physician (Podiatry)  Indicate any recent Medical Services you may have received from other than [facility] providers in the past year (date may be approximate).      Assessment:   This is a routine wellness examination for Latifa.  Hearing/Vision screen Hearing  Screening - Comments:: Pt denies any hearing issues  Vision Screening - Comments:: [Pt follows up with Dr Prudencio Burly for annual eye exams   Dietary issues and exercise activities discussed: Current Exercise Habits: Home exercise routine, Type of exercise: Other - see comments, Time (Minutes): 45, Frequency (Times/Week): 3, Weekly Exercise (Minutes/Week): 135   Goals Addressed             This Visit's Progress    Patient Stated       Stay healthy and active        Depression Screen    01/06/2022   11:24 AM 10/19/2021   11:31 AM 12/25/2020   11:08 AM 08/27/2020    1:22 PM 12/05/2019    3:16 PM 11/23/2018    8:18 AM 08/02/2018    2:17 PM  PHQ 2/9 Scores  PHQ - 2 Score 0 0 0 0 0 0 0    Fall Risk    01/06/2022   11:30 AM 10/19/2021   11:31 AM 07/14/2021    4:00 PM 06/23/2021    9:19 AM 01/22/2021   11:32 AM  Fall Risk   Falls in the past year? 1 0 '1 1 1  '$ Number falls in past yr: 1 0 0 1 1  Injury with Fall? 0 0 0 1 0  Comment    Head   Risk for fall due to : Impaired vision;Impaired balance/gait;Impaired mobility History of fall(s) History of fall(s) History of fall(s) Impaired balance/gait  Follow up Falls prevention discussed Falls evaluation completed Falls evaluation completed Falls evaluation completed Falls evaluation completed;Falls prevention discussed    FALL RISK PREVENTION PERTAINING TO THE HOME:  Any stairs in or around the home? No  If so, are there any without handrails? No  Home free of loose throw rugs in walkways, pet beds, electrical cords, etc? Yes  Adequate lighting in your home to reduce risk of falls? Yes   ASSISTIVE DEVICES UTILIZED TO PREVENT FALLS:  Life alert? Yes  Use of a cane, walker or w/c? Yes  Grab bars in the bathroom? Yes  Shower chair or bench in shower? Yes  Elevated toilet seat or a handicapped toilet? Yes   TIMED UP AND GO:  Was the  test performed? No .   Cognitive Function:    11/09/2017    2:45 PM  MMSE - Mini Mental State Exam  Orientation to time 5  Orientation to Place 5  Registration 3  Attention/ Calculation 5  Recall 2  Language- name 2 objects 2  Language- repeat 1  Language- follow 3 step command 3  Language- read & follow direction 1  Write a sentence 1  Copy design 1  Total score 29        01/06/2022   11:32 AM 12/25/2020   11:26 AM 12/05/2019    3:23 PM  6CIT Screen  What Year? 0 points 0 points 0 points  What month? 0 points 0 points 0 points  What time? 0 points 0 points   Count back from 20 0 points 0 points 0 points  Months in reverse 4 points 4 points 0 points  Repeat phrase 0 points 0 points 0 points  Total Score 4 points 4 points     Immunizations Immunization History  Administered Date(s) Administered   Fluad Quad(high Dose 65+) 10/17/2018, 11/18/2019, 11/04/2020   Influenza Split 11/21/2008, 02/09/2010, 10/24/2011, 11/21/2012   Influenza Whole 11/27/2008   Influenza, High Dose Seasonal PF 11/12/2011, 11/29/2012, 11/05/2013, 01/05/2015, 11/16/2015, 10/21/2016,  11/09/2017   Influenza,inj,Quad PF,6+ Mos 11/23/2015   Influenza-Unspecified 02/02/2011   PFIZER(Purple Top)SARS-COV-2 Vaccination 06/03/2019, 06/17/2019   Pfizer Covid-19 Vaccine Bivalent Booster 59yr & up 12/25/2020   Pneumococcal Conjugate-13 04/23/2013   Pneumococcal Polysaccharide-23 02/21/2005   Tdap 09/22/2007, 06/19/2021   Zoster Recombinat (Shingrix) 07/11/2016, 01/27/2017   Zoster, Live 04/22/2010    TDAP status: Up to date  Flu Vaccine status: Up to date  Pneumococcal vaccine status: Up to date  Covid-19 vaccine status: Completed vaccines  Qualifies for Shingles Vaccine? Yes   Zostavax completed Yes   Shingrix Completed?: Yes  Screening Tests Health Maintenance  Topic Date Due   COVID-19 Vaccine (4 - Pfizer series) 04/24/2021   INFLUENZA VACCINE  09/21/2021   DEXA SCAN  06/09/2022    Medicare Annual Wellness (AWV)  01/07/2023   Pneumonia Vaccine 88 Years old  Completed   Zoster Vaccines- Shingrix  Completed   HPV VACCINES  Aged Out   TETANUS/TDAP  Discontinued    Health Maintenance  Health Maintenance Due  Topic Date Due   COVID-19 Vaccine (4 - Pfizer series) 04/24/2021   INFLUENZA VACCINE  09/21/2021    Colorectal cancer screening: No longer required.   Mammogram status: Completed 09/29/17. Repeat every year  Bone Density status: Completed 06/08/20. Results reflect: Bone density results: OSTEOPOROSIS. Repeat every 2 years.  Additional Screening:   Vision Screening: Recommended annual ophthalmology exams for early detection of glaucoma and other disorders of the eye. Is the patient up to date with their annual eye exam?  Yes  Who is the provider or what is the name of the office in which the patient attends annual eye exams? Dr GKaty Apo If pt is not established with a provider, would they like to be referred to a provider to establish care? No .   Dental Screening: Recommended annual dental exams for proper oral hygiene  Community Resource Referral / Chronic Care Management: CRR required this visit?  No   CCM required this visit?  No      Plan:     I have personally reviewed and noted the following in the patient's chart:   Medical and social history Use of alcohol, tobacco or illicit drugs  Current medications and supplements including opioid prescriptions. Patient is not currently taking opioid prescriptions. Functional ability and status Nutritional status Physical activity Advanced directives List of other physicians Hospitalizations, surgeries, and ER visits in previous 12 months Vitals Screenings to include cognitive, depression, and falls Referrals and appointments  In addition, I have reviewed and discussed with patient certain preventive protocols, quality metrics, and best practice recommendations. A written personalized care  plan for preventive services as well as general preventive health recommendations were provided to patient.     TWillette Brace LPN   [id number]  Nurse Notes: none

## 2022-01-06 NOTE — Patient Instructions (Signed)
Description   Do not take any warfarin today then START taking warfarin 1/2 a tablet daily. Recheck INR in 2 weeks. [facility] Clinic [phone] Please call [facility] with instruction [phone]

## 2022-01-07 DIAGNOSIS — R2681 Unsteadiness on feet: Secondary | ICD-10-CM | POA: Diagnosis not present

## 2022-01-07 DIAGNOSIS — M6281 Muscle weakness (generalized): Secondary | ICD-10-CM | POA: Diagnosis not present

## 2022-01-07 DIAGNOSIS — R2689 Other abnormalities of gait and mobility: Secondary | ICD-10-CM | POA: Diagnosis not present

## 2022-01-10 DIAGNOSIS — M6281 Muscle weakness (generalized): Secondary | ICD-10-CM | POA: Diagnosis not present

## 2022-01-10 DIAGNOSIS — R2689 Other abnormalities of gait and mobility: Secondary | ICD-10-CM | POA: Diagnosis not present

## 2022-01-10 DIAGNOSIS — R2681 Unsteadiness on feet: Secondary | ICD-10-CM | POA: Diagnosis not present

## 2022-01-10 NOTE — Telephone Encounter (Signed)
Forwarding to Rx prior auth team.

## 2022-01-10 NOTE — Telephone Encounter (Signed)
Last Prolia injection 12/15/21 Next Prolia injection due 06/17/22

## 2022-01-11 DIAGNOSIS — R2681 Unsteadiness on feet: Secondary | ICD-10-CM | POA: Diagnosis not present

## 2022-01-11 DIAGNOSIS — M6281 Muscle weakness (generalized): Secondary | ICD-10-CM | POA: Diagnosis not present

## 2022-01-11 DIAGNOSIS — R2689 Other abnormalities of gait and mobility: Secondary | ICD-10-CM | POA: Diagnosis not present

## 2022-01-12 ENCOUNTER — Ambulatory Visit: Payer: Medicare Other | Admitting: Allergy

## 2022-01-14 DIAGNOSIS — R2689 Other abnormalities of gait and mobility: Secondary | ICD-10-CM | POA: Diagnosis not present

## 2022-01-14 DIAGNOSIS — M6281 Muscle weakness (generalized): Secondary | ICD-10-CM | POA: Diagnosis not present

## 2022-01-14 DIAGNOSIS — R2681 Unsteadiness on feet: Secondary | ICD-10-CM | POA: Diagnosis not present

## 2022-01-17 DIAGNOSIS — R2681 Unsteadiness on feet: Secondary | ICD-10-CM | POA: Diagnosis not present

## 2022-01-17 DIAGNOSIS — M6281 Muscle weakness (generalized): Secondary | ICD-10-CM | POA: Diagnosis not present

## 2022-01-17 DIAGNOSIS — R2689 Other abnormalities of gait and mobility: Secondary | ICD-10-CM | POA: Diagnosis not present

## 2022-01-18 DIAGNOSIS — R2689 Other abnormalities of gait and mobility: Secondary | ICD-10-CM | POA: Diagnosis not present

## 2022-01-18 DIAGNOSIS — M6281 Muscle weakness (generalized): Secondary | ICD-10-CM | POA: Diagnosis not present

## 2022-01-18 DIAGNOSIS — R2681 Unsteadiness on feet: Secondary | ICD-10-CM | POA: Diagnosis not present

## 2022-01-19 ENCOUNTER — Ambulatory Visit: Payer: Medicare Other | Attending: Cardiology

## 2022-01-19 DIAGNOSIS — Z5181 Encounter for therapeutic drug level monitoring: Secondary | ICD-10-CM

## 2022-01-19 DIAGNOSIS — Z7901 Long term (current) use of anticoagulants: Secondary | ICD-10-CM

## 2022-01-19 LAB — POCT INR: INR: 1.8 — AB (ref 2.0–3.0)

## 2022-01-19 NOTE — Patient Instructions (Signed)
Description   START taking warfarin 1/2 a tablet daily EXCEPT 1 tablet on Wednesdays.  Recheck INR in 2 weeks.  [facility] [phone] Please call [facility] with instruction [phone]

## 2022-01-20 DIAGNOSIS — R2689 Other abnormalities of gait and mobility: Secondary | ICD-10-CM | POA: Diagnosis not present

## 2022-01-20 DIAGNOSIS — M6281 Muscle weakness (generalized): Secondary | ICD-10-CM | POA: Diagnosis not present

## 2022-01-20 DIAGNOSIS — R2681 Unsteadiness on feet: Secondary | ICD-10-CM | POA: Diagnosis not present

## 2022-01-20 DIAGNOSIS — I110 Hypertensive heart disease with heart failure: Secondary | ICD-10-CM | POA: Diagnosis not present

## 2022-01-20 DIAGNOSIS — I129 Hypertensive chronic kidney disease with stage 1 through stage 4 chronic kidney disease, or unspecified chronic kidney disease: Secondary | ICD-10-CM | POA: Diagnosis not present

## 2022-01-21 DIAGNOSIS — R2681 Unsteadiness on feet: Secondary | ICD-10-CM | POA: Diagnosis not present

## 2022-01-21 DIAGNOSIS — M6281 Muscle weakness (generalized): Secondary | ICD-10-CM | POA: Diagnosis not present

## 2022-01-21 DIAGNOSIS — R2689 Other abnormalities of gait and mobility: Secondary | ICD-10-CM | POA: Diagnosis not present

## 2022-01-24 DIAGNOSIS — R2681 Unsteadiness on feet: Secondary | ICD-10-CM | POA: Diagnosis not present

## 2022-01-24 DIAGNOSIS — E782 Mixed hyperlipidemia: Secondary | ICD-10-CM | POA: Diagnosis not present

## 2022-01-24 DIAGNOSIS — E039 Hypothyroidism, unspecified: Secondary | ICD-10-CM | POA: Diagnosis not present

## 2022-01-24 DIAGNOSIS — M6281 Muscle weakness (generalized): Secondary | ICD-10-CM | POA: Diagnosis not present

## 2022-01-24 DIAGNOSIS — R2689 Other abnormalities of gait and mobility: Secondary | ICD-10-CM | POA: Diagnosis not present

## 2022-01-24 DIAGNOSIS — N182 Chronic kidney disease, stage 2 (mild): Secondary | ICD-10-CM | POA: Diagnosis not present

## 2022-01-24 DIAGNOSIS — I129 Hypertensive chronic kidney disease with stage 1 through stage 4 chronic kidney disease, or unspecified chronic kidney disease: Secondary | ICD-10-CM | POA: Diagnosis not present

## 2022-01-25 DIAGNOSIS — R2681 Unsteadiness on feet: Secondary | ICD-10-CM | POA: Diagnosis not present

## 2022-01-25 DIAGNOSIS — R2689 Other abnormalities of gait and mobility: Secondary | ICD-10-CM | POA: Diagnosis not present

## 2022-01-25 DIAGNOSIS — M6281 Muscle weakness (generalized): Secondary | ICD-10-CM | POA: Diagnosis not present

## 2022-01-26 DIAGNOSIS — M2012 Hallux valgus (acquired), left foot: Secondary | ICD-10-CM | POA: Diagnosis not present

## 2022-01-26 DIAGNOSIS — R2681 Unsteadiness on feet: Secondary | ICD-10-CM | POA: Diagnosis not present

## 2022-01-26 DIAGNOSIS — B351 Tinea unguium: Secondary | ICD-10-CM | POA: Diagnosis not present

## 2022-01-26 DIAGNOSIS — M6281 Muscle weakness (generalized): Secondary | ICD-10-CM | POA: Diagnosis not present

## 2022-01-26 DIAGNOSIS — R2689 Other abnormalities of gait and mobility: Secondary | ICD-10-CM | POA: Diagnosis not present

## 2022-01-26 DIAGNOSIS — M2042 Other hammer toe(s) (acquired), left foot: Secondary | ICD-10-CM | POA: Diagnosis not present

## 2022-01-27 DIAGNOSIS — M6281 Muscle weakness (generalized): Secondary | ICD-10-CM | POA: Diagnosis not present

## 2022-01-27 DIAGNOSIS — R2689 Other abnormalities of gait and mobility: Secondary | ICD-10-CM | POA: Diagnosis not present

## 2022-01-27 DIAGNOSIS — R2681 Unsteadiness on feet: Secondary | ICD-10-CM | POA: Diagnosis not present

## 2022-02-01 DIAGNOSIS — R2681 Unsteadiness on feet: Secondary | ICD-10-CM | POA: Diagnosis not present

## 2022-02-01 DIAGNOSIS — R2689 Other abnormalities of gait and mobility: Secondary | ICD-10-CM | POA: Diagnosis not present

## 2022-02-01 DIAGNOSIS — M6281 Muscle weakness (generalized): Secondary | ICD-10-CM | POA: Diagnosis not present

## 2022-02-02 DIAGNOSIS — R2689 Other abnormalities of gait and mobility: Secondary | ICD-10-CM | POA: Diagnosis not present

## 2022-02-02 DIAGNOSIS — M6281 Muscle weakness (generalized): Secondary | ICD-10-CM | POA: Diagnosis not present

## 2022-02-02 DIAGNOSIS — R2681 Unsteadiness on feet: Secondary | ICD-10-CM | POA: Diagnosis not present

## 2022-02-03 ENCOUNTER — Ambulatory Visit: Payer: Medicare Other | Attending: Cardiovascular Disease

## 2022-02-03 ENCOUNTER — Encounter: Payer: Self-pay | Admitting: *Deleted

## 2022-02-03 DIAGNOSIS — Z86718 Personal history of other venous thrombosis and embolism: Secondary | ICD-10-CM

## 2022-02-03 DIAGNOSIS — Z7901 Long term (current) use of anticoagulants: Secondary | ICD-10-CM | POA: Diagnosis not present

## 2022-02-03 LAB — POCT INR: INR: 2.3 (ref 2.0–3.0)

## 2022-02-03 NOTE — Patient Instructions (Signed)
Description   Continue taking warfarin 1/2 a tablet daily EXCEPT 1 tablet on Wednesdays.  Recheck INR in 3 weeks.  [facility] [phone] Please call [facility] with instruction [phone]

## 2022-02-04 DIAGNOSIS — H401131 Primary open-angle glaucoma, bilateral, mild stage: Secondary | ICD-10-CM | POA: Diagnosis not present

## 2022-02-04 DIAGNOSIS — Z961 Presence of intraocular lens: Secondary | ICD-10-CM | POA: Diagnosis not present

## 2022-02-10 DIAGNOSIS — R2681 Unsteadiness on feet: Secondary | ICD-10-CM | POA: Diagnosis not present

## 2022-02-10 DIAGNOSIS — R2689 Other abnormalities of gait and mobility: Secondary | ICD-10-CM | POA: Diagnosis not present

## 2022-02-10 DIAGNOSIS — M6281 Muscle weakness (generalized): Secondary | ICD-10-CM | POA: Diagnosis not present

## 2022-02-15 DIAGNOSIS — M6281 Muscle weakness (generalized): Secondary | ICD-10-CM | POA: Diagnosis not present

## 2022-02-15 DIAGNOSIS — R2689 Other abnormalities of gait and mobility: Secondary | ICD-10-CM | POA: Diagnosis not present

## 2022-02-15 DIAGNOSIS — R2681 Unsteadiness on feet: Secondary | ICD-10-CM | POA: Diagnosis not present

## 2022-02-17 DIAGNOSIS — M6281 Muscle weakness (generalized): Secondary | ICD-10-CM | POA: Diagnosis not present

## 2022-02-17 DIAGNOSIS — R2681 Unsteadiness on feet: Secondary | ICD-10-CM | POA: Diagnosis not present

## 2022-02-17 DIAGNOSIS — R2689 Other abnormalities of gait and mobility: Secondary | ICD-10-CM | POA: Diagnosis not present

## 2022-02-18 DIAGNOSIS — R2681 Unsteadiness on feet: Secondary | ICD-10-CM | POA: Diagnosis not present

## 2022-02-18 DIAGNOSIS — R2689 Other abnormalities of gait and mobility: Secondary | ICD-10-CM | POA: Diagnosis not present

## 2022-02-18 DIAGNOSIS — M6281 Muscle weakness (generalized): Secondary | ICD-10-CM | POA: Diagnosis not present

## 2022-02-22 DIAGNOSIS — R2681 Unsteadiness on feet: Secondary | ICD-10-CM | POA: Diagnosis not present

## 2022-02-22 DIAGNOSIS — M6281 Muscle weakness (generalized): Secondary | ICD-10-CM | POA: Diagnosis not present

## 2022-02-23 DIAGNOSIS — E038 Other specified hypothyroidism: Secondary | ICD-10-CM | POA: Diagnosis not present

## 2022-02-23 DIAGNOSIS — E782 Mixed hyperlipidemia: Secondary | ICD-10-CM | POA: Diagnosis not present

## 2022-02-23 DIAGNOSIS — Z7901 Long term (current) use of anticoagulants: Secondary | ICD-10-CM | POA: Diagnosis not present

## 2022-02-23 DIAGNOSIS — H401131 Primary open-angle glaucoma, bilateral, mild stage: Secondary | ICD-10-CM | POA: Diagnosis not present

## 2022-02-23 DIAGNOSIS — I350 Nonrheumatic aortic (valve) stenosis: Secondary | ICD-10-CM | POA: Diagnosis not present

## 2022-02-23 DIAGNOSIS — Z86718 Personal history of other venous thrombosis and embolism: Secondary | ICD-10-CM | POA: Diagnosis not present

## 2022-02-23 DIAGNOSIS — I10 Essential (primary) hypertension: Secondary | ICD-10-CM | POA: Diagnosis not present

## 2022-02-24 ENCOUNTER — Ambulatory Visit: Payer: Medicare Other | Attending: Cardiology

## 2022-02-24 DIAGNOSIS — Z7901 Long term (current) use of anticoagulants: Secondary | ICD-10-CM | POA: Diagnosis not present

## 2022-02-24 LAB — POCT INR: INR: 2.9 (ref 2.0–3.0)

## 2022-02-24 NOTE — Patient Instructions (Signed)
Description   Continue taking warfarin 1/2 a tablet daily EXCEPT 1 tablet on Wednesdays.  Recheck INR in 5 weeks.  [facility] [phone] Please call [facility] with instruction [phone]

## 2022-02-25 DIAGNOSIS — R2681 Unsteadiness on feet: Secondary | ICD-10-CM | POA: Diagnosis not present

## 2022-02-25 DIAGNOSIS — M6281 Muscle weakness (generalized): Secondary | ICD-10-CM | POA: Diagnosis not present

## 2022-03-02 DIAGNOSIS — R2681 Unsteadiness on feet: Secondary | ICD-10-CM | POA: Diagnosis not present

## 2022-03-02 DIAGNOSIS — M6281 Muscle weakness (generalized): Secondary | ICD-10-CM | POA: Diagnosis not present

## 2022-03-04 DIAGNOSIS — M6281 Muscle weakness (generalized): Secondary | ICD-10-CM | POA: Diagnosis not present

## 2022-03-04 DIAGNOSIS — R2681 Unsteadiness on feet: Secondary | ICD-10-CM | POA: Diagnosis not present

## 2022-03-08 DIAGNOSIS — M6281 Muscle weakness (generalized): Secondary | ICD-10-CM | POA: Diagnosis not present

## 2022-03-08 DIAGNOSIS — R2681 Unsteadiness on feet: Secondary | ICD-10-CM | POA: Diagnosis not present

## 2022-03-10 DIAGNOSIS — R2681 Unsteadiness on feet: Secondary | ICD-10-CM | POA: Diagnosis not present

## 2022-03-10 DIAGNOSIS — M6281 Muscle weakness (generalized): Secondary | ICD-10-CM | POA: Diagnosis not present

## 2022-03-17 DIAGNOSIS — M6281 Muscle weakness (generalized): Secondary | ICD-10-CM | POA: Diagnosis not present

## 2022-03-17 DIAGNOSIS — R2681 Unsteadiness on feet: Secondary | ICD-10-CM | POA: Diagnosis not present

## 2022-03-18 DIAGNOSIS — R2681 Unsteadiness on feet: Secondary | ICD-10-CM | POA: Diagnosis not present

## 2022-03-18 DIAGNOSIS — M6281 Muscle weakness (generalized): Secondary | ICD-10-CM | POA: Diagnosis not present

## 2022-03-21 ENCOUNTER — Ambulatory Visit (INDEPENDENT_AMBULATORY_CARE_PROVIDER_SITE_OTHER): Payer: Medicare Other | Admitting: Family Medicine

## 2022-03-21 ENCOUNTER — Encounter: Payer: Self-pay | Admitting: Family Medicine

## 2022-03-21 VITALS — BP 134/80 | HR 84 | Temp 97.9°F | Ht 63.0 in | Wt 121.0 lb

## 2022-03-21 DIAGNOSIS — I10 Essential (primary) hypertension: Secondary | ICD-10-CM | POA: Diagnosis not present

## 2022-03-21 DIAGNOSIS — I5032 Chronic diastolic (congestive) heart failure: Secondary | ICD-10-CM | POA: Diagnosis not present

## 2022-03-21 DIAGNOSIS — Z7901 Long term (current) use of anticoagulants: Secondary | ICD-10-CM | POA: Diagnosis not present

## 2022-03-21 DIAGNOSIS — E038 Other specified hypothyroidism: Secondary | ICD-10-CM | POA: Diagnosis not present

## 2022-03-21 DIAGNOSIS — E039 Hypothyroidism, unspecified: Secondary | ICD-10-CM

## 2022-03-21 DIAGNOSIS — I050 Rheumatic mitral stenosis: Secondary | ICD-10-CM

## 2022-03-21 DIAGNOSIS — R5383 Other fatigue: Secondary | ICD-10-CM

## 2022-03-21 DIAGNOSIS — R112 Nausea with vomiting, unspecified: Secondary | ICD-10-CM | POA: Diagnosis not present

## 2022-03-21 DIAGNOSIS — D509 Iron deficiency anemia, unspecified: Secondary | ICD-10-CM | POA: Diagnosis not present

## 2022-03-21 DIAGNOSIS — D649 Anemia, unspecified: Secondary | ICD-10-CM | POA: Diagnosis not present

## 2022-03-21 DIAGNOSIS — D682 Hereditary deficiency of other clotting factors: Secondary | ICD-10-CM | POA: Diagnosis not present

## 2022-03-21 MED ORDER — FAMOTIDINE 20 MG PO TABS: 20.0000 mg | ORAL_TABLET | Freq: Two times a day (BID) | ORAL | 1 refills | Status: AC

## 2022-03-21 NOTE — Progress Notes (Signed)
Subjective  CC:  Chief Complaint  Patient presents with   Nausea     Pt stated that she has been experiencing some Nausea/abd pain for the past 3 days    Abdominal Pain    HPI: Robin Harrell is a 89 y.o. female who presents to the office today to address the problems listed above in the chief complaint. 89 year old female with severe mitral stenosis, long-term anticoagulation for hypercoagulable state, immobility, kyphosis presents due to ongoing nausea for the last 2 to 3 days.  She had 1 episode of emesis today.  No blood.  No significant abdominal pain.  Decreased appetite over last 3 days.  No fevers, chills or diarrhea.  No lower abdominal symptoms.  No chest pain, shortness of breath or cough.  She reports that over the last 6 months or so she has had weekly episodes of nausea but typically only last for 1 or 2 meals.  Never has persisted quite like this.  No new medications.  No palpitations.  No urinary symptoms. I reviewed recent cardiology notes.  Recent lab work. Reviewed last emergency room hospitalization for weakness after pneumonia.  Reviewed last chest x-ray.  Assessment  1. Nausea and vomiting, unspecified vomiting type   2. Long term (current) use of anticoagulants   3. Chronic diastolic CHF (congestive heart failure) ([place])   4. Severe mitral stenosis by prior echocardiogram   5. Hypothyroidism (acquired)   6. Other fatigue      Plan  Nausea recurrent: Possibly related to active GERD.  Start Pepcid 20 mg twice daily.  Continue Zofran as needed.  Monitor symptoms over the next 2 weeks.  Check lab work.  Return if not improving.  To emergency room for fevers or severe abdominal pain.  Abdomen is benign.  Vital signs are stable. Recheck thyroid levels. Malnourished.  Check levels.  Follow up: 3 months for complete physical 04/14/2022  Orders Placed This Encounter  Procedures   Urine Culture   CBC with Differential/Platelet   Comprehensive metabolic panel    TSH   Meds ordered this encounter  Medications   famotidine (PEPCID) 20 MG tablet    Sig: Take 1 tablet (20 mg total) by mouth 2 (two) times daily.    Dispense:  60 tablet    Refill:  1      I reviewed the patients updated PMH, FH, and SocHx.    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    Priority: High   Osteoporosis 02/09/2010    Priority: High   Essential hypertension 11/15/2007    Priority: High   Chronic venous stasis dermatitis of both lower extremities 12/31/2018    Priority: Medium    Severe aortic stenosis 02/07/2017    Priority: Medium    Chronic idiopathic thrombocytopenia ([place]) 04/14/2016    Priority: Medium    Essential tremor 09/03/2015    Priority: Medium    Edema of right lower extremity 02/01/2013    Priority: Medium    Colon polyp 10/15/2012  Priority: Medium    Insomnia 10/11/2011    Priority: Medium    Osteoarthritis, multiple sites 06/30/2011    Priority: Medium    DJD (degenerative joint disease), lumbar 10/14/2009    Priority: Medium    Glaucoma 01/29/2008    Priority: Medium    History of DVT (deep vein thrombosis) 11/15/2007    Priority: Medium    GERD 11/15/2007    Priority: Medium    Chronic cough 11/15/2007    Priority: Medium    Primary osteoarthritis of both knees 11/18/2019    Priority: Low   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   atorvastatin (LIPITOR) 10 MG tablet TAKE 1 TABLET (10 MG TOTAL) BY MOUTH DAILY AT 6 PM. (Patient taking differently: Take 10 mg by mouth daily.)   brimonidine (ALPHAGAN) 0.2 % ophthalmic solution Place 1 drop into both eyes 2 (two) times daily.   Calcium-Phosphorus-Vitamin D (CALCIUM/D3 ADULT GUMMIES) 200-96.6-200 MG-MG-UNIT CHEW Chew 2 tablets by mouth daily.   chlorpheniramine (CHLOR-TRIMETON) 4 MG tablet Take 4 mg by mouth 2 (two) times daily.   famotidine (PEPCID) 20 MG tablet Take 1 tablet (20 mg total) by mouth 2 (two) times daily.   fluticasone (FLONASE) 50 MCG/ACT nasal spray SPRAY 2 SPRAYS INTO EACH NOSTRIL EVERY DAY (Patient taking differently: Place 2 sprays into both nostrils daily.)   furosemide (LASIX) 20 MG tablet Take 1 tablet (20 mg total) by mouth daily as needed for fluid.   ipratropium (ATROVENT) 0.03 % nasal spray Place 2 sprays into both nostrils 3 (three) times daily as needed for rhinitis.   latanoprost (XALATAN) 0.005 % ophthalmic solution Place 1 drop into both eyes at bedtime.    levocetirizine (XYZAL) 5 MG tablet TAKE 1 TABLET BY MOUTH EVERY DAY IN THE EVENING (Patient taking differently: Take 5 mg by mouth every evening.)   levothyroxine (SYNTHROID) 25 MCG tablet Take 1 tablet (25 mcg total) by mouth daily before breakfast.   loperamide HCl (IMODIUM A-D) 1 MG/7.5ML solution Take 15 mLs (2 mg total) by mouth as needed for diarrhea or loose stools.   Menthol-Ascorbic Acid (LUDENS COUGH DROPS) 3-60 MG LOZG Take 1 every 2-4 hours as needed   metoprolol tartrate (LOPRESSOR) 25 MG tablet Take 1 tablet (25 mg total) by mouth 2 (two) times daily.   multivitamin-iron-minerals-folic  acid (CENTRUM) chewable tablet Chew 1 tablet by mouth daily.   ondansetron (ZOFRAN) 4 MG tablet Take 4 mg by mouth every 8 (eight) hours as needed for nausea or vomiting.   warfarin (COUMADIN) 7.5 MG tablet 1/2 tab M,W, F and 1 tablet on T,Th,S,Su (Patient taking differently: Take 3.75-7.5 mg by mouth See admin instructions. Take 1/2 tablet (3.75 mg) on Mon,Wed, and Fri. THEN Take 1 tablet (7.5 mg) on Tues, Thurs, Sat, and Sun)    Allergies: Patient is allergic to azithromycin, codeine, and erythromycin. Family History: Patient family history includes Allergic rhinitis in her sister; Breast cancer (age of onset: 7) in her daughter; Cirrhosis  in her father; Coronary artery disease in her maternal grandfather and paternal grandfather; Heart disease in her paternal grandmother; Lung cancer in her father; Skin cancer in her brother; Throat cancer in her maternal grandfather and paternal grandfather. Social History:  Patient  reports that she quit smoking about 64 years ago. Her smoking use included cigarettes. She has never used smokeless tobacco. She reports current alcohol use. She reports that she does not use drugs.  Review of Systems: Constitutional: Negative for fever malaise or anorexia Cardiovascular: negative for chest pain Respiratory: negative for SOB or persistent cough Gastrointestinal: negative for abdominal pain  Objective  Vitals: BP 134/80   Pulse 84   Temp 97.9 F (36.6 C)   Ht '5\' 3"'$  (1.6 m)   Wt 121 lb (54.9 kg)   LMP  (LMP Unknown)   SpO2 91%   BMI 21.43 kg/m  General: no acute distress , A&Ox3, kyphotic HEENT: PEERL, conjunctiva normal, neck is supple Cardiovascular: Regular rate with 3/6 systolic murmur Respiratory:  Good breath sounds bilaterally, CTAB with normal respiratory effort Abdomen: Soft, nontender, normal bowel sounds Skin:  Warm, no rashes  Commons side effects, risks, benefits, and alternatives for medications and treatment plan prescribed today  were discussed, and the patient expressed understanding of the given instructions. Patient is instructed to call or message via MyChart if he/she has any questions or concerns regarding our treatment plan. No barriers to understanding were identified. We discussed Red Flag symptoms and signs in detail. Patient expressed understanding regarding what to do in case of urgent or emergency type symptoms.  Medication list was reconciled, printed and provided to the patient in AVS. Patient instructions and summary information was reviewed with the patient as documented in the AVS. This note was prepared with assistance of Dragon voice recognition software. Occasional wrong-word or sound-a-like substitutions may have occurred due to the inherent limitations of voice recognition software

## 2022-03-21 NOTE — Patient Instructions (Signed)
Please return in 3 months for recheck with lab work. Return sooner if your symptoms do not improve.   I will release your lab results to you on your MyChart account with further instructions. You may see the results before I do, but when I review them I will send you a message with my report or have my assistant call you if things need to be discussed. Please reply to my message with any questions. Thank you!   Take the zofran for nausea every 6 hours as needed for nausea.   If you have any questions or concerns, please don't hesitate to send me a message via MyChart or call the office at [phone]. Thank you for visiting with [place] today! It's our pleasure caring for you.

## 2022-03-22 DIAGNOSIS — M6281 Muscle weakness (generalized): Secondary | ICD-10-CM | POA: Diagnosis not present

## 2022-03-22 DIAGNOSIS — R2681 Unsteadiness on feet: Secondary | ICD-10-CM | POA: Diagnosis not present

## 2022-03-22 LAB — COMPREHENSIVE METABOLIC PANEL
ALT: 23 U/L (ref 0–35)
AST: 25 U/L (ref 0–37)
Albumin: 3.8 g/dL (ref 3.5–5.2)
Alkaline Phosphatase: 73 U/L (ref 39–117)
BUN: 14 mg/dL (ref 6–23)
CO2: 26 mEq/L (ref 19–32)
Calcium: 9 mg/dL (ref 8.4–10.5)
Chloride: 102 mEq/L (ref 96–112)
Creatinine, Ser: 0.83 mg/dL (ref 0.40–1.20)
GFR: 63.94 mL/min (ref >60.00)
Glucose, Bld: 101 mg/dL — ABNORMAL HIGH (ref 70–99)
Potassium: 4.5 mEq/L (ref 3.5–5.1)
Sodium: 135 mEq/L (ref 135–145)
Total Bilirubin: 1.5 mg/dL — ABNORMAL HIGH (ref 0.2–1.2)
Total Protein: 6.7 g/dL (ref 6.0–8.3)

## 2022-03-22 LAB — CBC WITH DIFFERENTIAL/PLATELET
Basophils Absolute: 0 10*3/uL (ref 0.0–0.1)
Basophils Relative: 0.7 % (ref 0.0–3.0)
Eosinophils Absolute: 0.1 10*3/uL (ref 0.0–0.7)
Eosinophils Relative: 2.6 % (ref 0.0–5.0)
HCT: 35.5 % — ABNORMAL LOW (ref 36.0–46.0)
Hemoglobin: 11.8 g/dL — ABNORMAL LOW (ref 12.0–15.0)
Lymphocytes Relative: 11.4 % — ABNORMAL LOW (ref 12.0–46.0)
Lymphs Abs: 0.6 10*3/uL — ABNORMAL LOW (ref 0.7–4.0)
MCHC: 33.2 g/dL (ref 30.0–36.0)
MCV: 92.4 fl (ref 78.0–100.0)
Monocytes Absolute: 0.4 10*3/uL (ref 0.1–1.0)
Monocytes Relative: 7.8 % (ref 3.0–12.0)
Neutro Abs: 4.1 10*3/uL (ref 1.4–7.7)
Neutrophils Relative %: 77.5 % — ABNORMAL HIGH (ref 43.0–77.0)
Platelets: 175 10*3/uL (ref 150.0–400.0)
RBC: 3.84 Mil/uL — ABNORMAL LOW (ref 3.87–5.11)
RDW: 15.4 % (ref 11.5–15.5)
WBC: 5.3 10*3/uL (ref 4.0–10.5)

## 2022-03-22 LAB — TSH: TSH: 2.54 u[IU]/mL (ref 0.35–5.50)

## 2022-03-22 LAB — URINE CULTURE
MICRO NUMBER:: 14486426
SPECIMEN QUALITY:: ADEQUATE

## 2022-03-24 DIAGNOSIS — M6281 Muscle weakness (generalized): Secondary | ICD-10-CM | POA: Diagnosis not present

## 2022-03-24 DIAGNOSIS — R2681 Unsteadiness on feet: Secondary | ICD-10-CM | POA: Diagnosis not present

## 2022-03-29 DIAGNOSIS — M6281 Muscle weakness (generalized): Secondary | ICD-10-CM | POA: Diagnosis not present

## 2022-03-29 DIAGNOSIS — R2681 Unsteadiness on feet: Secondary | ICD-10-CM | POA: Diagnosis not present

## 2022-03-30 NOTE — Progress Notes (Signed)
Please call patient: I have reviewed his/her lab results. Please call to see how she is feeling. Still having nausea or vomiting? Is pepcid helping. Labs show an elevated bilirubin for unclear reasons. Needs to be repeated if still with sxs and need an ultrasound of the liver and pancreas if remains elevated.  Please let me know her response

## 2022-03-31 DIAGNOSIS — R2681 Unsteadiness on feet: Secondary | ICD-10-CM | POA: Diagnosis not present

## 2022-03-31 DIAGNOSIS — M6281 Muscle weakness (generalized): Secondary | ICD-10-CM | POA: Diagnosis not present

## 2022-04-01 ENCOUNTER — Encounter: Payer: Self-pay | Admitting: Family Medicine

## 2022-04-05 ENCOUNTER — Encounter: Payer: Self-pay | Admitting: Family Medicine

## 2022-04-05 ENCOUNTER — Ambulatory Visit: Payer: Medicare Other | Attending: Cardiology | Admitting: *Deleted

## 2022-04-05 DIAGNOSIS — Z5181 Encounter for therapeutic drug level monitoring: Secondary | ICD-10-CM | POA: Diagnosis not present

## 2022-04-05 DIAGNOSIS — Z7901 Long term (current) use of anticoagulants: Secondary | ICD-10-CM | POA: Insufficient documentation

## 2022-04-05 LAB — POCT INR: POC INR: 2.5

## 2022-04-05 NOTE — Patient Instructions (Signed)
Description   Continue taking warfarin 1/2 a tablet daily EXCEPT 1 tablet on Wednesdays.  Recheck INR in 6 weeks.  [facility] [phone] Please call [facility] with instruction [phone]

## 2022-04-06 ENCOUNTER — Telehealth: Payer: Self-pay | Admitting: Family Medicine

## 2022-04-06 DIAGNOSIS — M6281 Muscle weakness (generalized): Secondary | ICD-10-CM | POA: Diagnosis not present

## 2022-04-06 DIAGNOSIS — R2681 Unsteadiness on feet: Secondary | ICD-10-CM | POA: Diagnosis not present

## 2022-04-06 NOTE — Telephone Encounter (Signed)
Spoke with pt bc she was having some concerns about how her coumadin has been given to her. I spoke with Bookie the Care coordinator and she stated that they are given her the medication 1tab on wed and 0.5 all the other days and according to the notes in chart this is how it is supposed to be administered.

## 2022-04-06 NOTE — Telephone Encounter (Signed)
Pt states she thinks they are giving her a wrong med at the place she is at, please advise.

## 2022-04-14 ENCOUNTER — Ambulatory Visit (INDEPENDENT_AMBULATORY_CARE_PROVIDER_SITE_OTHER): Payer: Medicare Other | Admitting: Family Medicine

## 2022-04-14 VITALS — BP 100/60 | HR 76 | Temp 98.0°F | Ht 63.0 in | Wt 121.2 lb

## 2022-04-14 DIAGNOSIS — R1013 Epigastric pain: Secondary | ICD-10-CM | POA: Diagnosis not present

## 2022-04-14 DIAGNOSIS — R112 Nausea with vomiting, unspecified: Secondary | ICD-10-CM

## 2022-04-14 DIAGNOSIS — R17 Unspecified jaundice: Secondary | ICD-10-CM | POA: Diagnosis not present

## 2022-04-14 DIAGNOSIS — R1011 Right upper quadrant pain: Secondary | ICD-10-CM

## 2022-04-14 DIAGNOSIS — D649 Anemia, unspecified: Secondary | ICD-10-CM

## 2022-04-14 DIAGNOSIS — Z7901 Long term (current) use of anticoagulants: Secondary | ICD-10-CM | POA: Diagnosis not present

## 2022-04-14 LAB — BASIC METABOLIC PANEL
BUN: 19 mg/dL (ref 6–23)
CO2: 27 mEq/L (ref 19–32)
Calcium: 8.7 mg/dL (ref 8.4–10.5)
Chloride: 104 mEq/L (ref 96–112)
Creatinine, Ser: 0.88 mg/dL (ref 0.40–1.20)
GFR: 59.58 mL/min — ABNORMAL LOW (ref >60.00)
Glucose, Bld: 68 mg/dL — ABNORMAL LOW (ref 70–99)
Potassium: 4.2 mEq/L (ref 3.5–5.1)
Sodium: 136 mEq/L (ref 135–145)

## 2022-04-14 LAB — CBC WITH DIFFERENTIAL/PLATELET
Basophils Absolute: 0 10*3/uL (ref 0.0–0.1)
Basophils Relative: 0.7 % (ref 0.0–3.0)
Eosinophils Absolute: 0.2 10*3/uL (ref 0.0–0.7)
Eosinophils Relative: 3 % (ref 0.0–5.0)
HCT: 33.6 % — ABNORMAL LOW (ref 36.0–46.0)
Hemoglobin: 11.3 g/dL — ABNORMAL LOW (ref 12.0–15.0)
Lymphocytes Relative: 12.7 % (ref 12.0–46.0)
Lymphs Abs: 0.7 10*3/uL (ref 0.7–4.0)
MCHC: 33.5 g/dL (ref 30.0–36.0)
MCV: 92.8 fl (ref 78.0–100.0)
Monocytes Absolute: 0.7 10*3/uL (ref 0.1–1.0)
Monocytes Relative: 11.4 % (ref 3.0–12.0)
Neutro Abs: 4.2 10*3/uL (ref 1.4–7.7)
Neutrophils Relative %: 72.2 % (ref 43.0–77.0)
Platelets: 145 10*3/uL — ABNORMAL LOW (ref 150.0–400.0)
RBC: 3.62 Mil/uL — ABNORMAL LOW (ref 3.87–5.11)
RDW: 15.5 % (ref 11.5–15.5)
WBC: 5.8 10*3/uL (ref 4.0–10.5)

## 2022-04-14 LAB — HEPATIC FUNCTION PANEL
ALT: 15 U/L (ref 0–35)
AST: 19 U/L (ref 0–37)
Albumin: 3.7 g/dL (ref 3.5–5.2)
Alkaline Phosphatase: 67 U/L (ref 39–117)
Bilirubin, Direct: 0.3 mg/dL (ref 0.0–0.3)
Total Bilirubin: 1 mg/dL (ref 0.2–1.2)
Total Protein: 6 g/dL (ref 6.0–8.3)

## 2022-04-14 LAB — B12 AND FOLATE PANEL
Folate: >23.9 ng/mL (ref >5.9)
Vitamin B-12: 523 pg/mL (ref 211–911)

## 2022-04-14 LAB — LIPASE: Lipase: 41 U/L (ref 11.0–59.0)

## 2022-04-14 NOTE — Progress Notes (Signed)
Subjective  CC:  Chief Complaint  Patient presents with   Abdominal Pain    Pt stated that she has been having some abdominal pain for the past 106mo. It has somewhat gotten better overtime.      HPI: Robin WESTFALLis a 89y.o. female who presents to the office today to address the problems listed above in the chief complaint. F/u abdominal pain and n/v: see last note. Started pepcid and sxs have improved but c/o postprandial upper and RUQ pain. No weight loss. Rare episode of emesis since last visit. Reports daily Bms, soft sometimes black on daily iron supplements. No BRB. No f/c/s. Reviewed last labs: elevated bili at 1.5/ has had cholecystectomy. No jaundice. Neg urine culture and normal cbc with stable anemia.  Here with daughter: having some confusion; mostly during conversations. Functioning well: attends nursing home functions/bingo/very social.    Assessment  1. RUQ pain   2. Nausea and vomiting, unspecified vomiting type   3. Long term (current) use of anticoagulants   4. Midepigastric pain   5. Chronic anemia   6. Elevated bilirubin      Plan  Abd pain:  needs further eval: ultrasound to evaluate biliary tract and liver and renal. Repeat liver tests and cbc. Continue pepcid.  Confusion: may be early cognitive decline but need to ensure not related to medical problem above. Complete work up first and then reevaluate. She is frustrated with her current living situation; has had lots of transiotion in last year; denies depressive sxs.   Follow up: 3 mo for recheck  Visit date not found  Orders Placed This Encounter  Procedures   Fecal occult blood, imunochemical([facility])   [place] Complete   Hepatic function panel   Basic metabolic panel   CBC with Differential/Platelet   Iron, TIBC and Ferritin Panel   B12 and Folate Panel   No orders of the defined types were placed in this encounter.     I reviewed the patients updated PMH, FH, and SocHx.     Patient Active Problem List   Diagnosis Date Noted   Severe mitral stenosis by prior echocardiogram 12/21/2021    Priority: High   Chronic diastolic CHF ([place]estive heart failure) ([place]     Priority: High   Hypothyroidism (acquired) 09/02/2014    Priority: High   Mixed hyperlipidemia 11/19/2012    Priority: High   Long term (current) use of anticoagulants 05/10/2012    Priority: High   Diastolic dysfunction [id number]   Priority: High   Factor V deficiency ([place] 02/09/2010    Priority: High   Osteoporosis 02/09/2010    Priority: High   Essential hypertension 11/15/2007    Priority: High   Chronic venous stasis dermatitis of both lower extremities 12/31/2018    Priority: Medium    Severe aortic stenosis 02/07/2017    Priority: Medium    [place] 04/14/2016    Priority: Medium    Essential tremor 09/03/2015    Priority: Medium    Edema of right lower extremity 02/01/2013    Priority: Medium    Colon polyp 10/15/2012    Priority: Medium    Insomnia 10/11/2011    Priority: Medium    Osteoarthritis, multiple sites 06/30/2011    Priority: Medium    DJD (degenerative joint disease), lumbar 10/14/2009    Priority: Medium    Glaucoma 01/29/2008    Priority: Medium    History of DVT (deep vein thrombosis) 11/15/2007  Priority: Medium    GERD 11/15/2007    Priority: Medium    Chronic cough 11/15/2007    Priority: Medium    Primary osteoarthritis of both knees 11/18/2019    Priority: Low   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   atorvastatin (LIPITOR) 10 MG tablet TAKE 1 TABLET (10 MG TOTAL) BY MOUTH DAILY AT 6 PM. (Patient taking differently: Take 10 mg by mouth daily.)   brimonidine (ALPHAGAN) 0.2 % ophthalmic solution Place 1 drop into both eyes 2 (two) times daily.   Calcium-Phosphorus-Vitamin D (CALCIUM/D3 ADULT GUMMIES) 200-96.6-200 MG-MG-UNIT CHEW Chew 2 tablets by mouth daily.   chlorpheniramine (CHLOR-TRIMETON) 4 MG tablet Take 4 mg by mouth 2 (two) times daily.   famotidine (PEPCID) 20 MG tablet Take 1 tablet (20 mg total) by mouth 2 (two) times daily.   fluticasone (FLONASE) 50 MCG/ACT nasal spray SPRAY 2 SPRAYS INTO EACH NOSTRIL EVERY DAY (Patient taking differently: Place 2 sprays into both nostrils daily.)   furosemide (LASIX) 20 MG tablet Take 1 tablet (20 mg total) by mouth daily as needed for fluid.   ipratropium (ATROVENT) 0.03 % nasal spray Place 2 sprays into both nostrils 3 (three) times daily as needed for rhinitis.   latanoprost (XALATAN) 0.005 % ophthalmic solution Place 1 drop into both eyes at bedtime.    levocetirizine (XYZAL) 5 MG tablet TAKE 1 TABLET BY MOUTH EVERY DAY IN THE EVENING (Patient taking differently: Take 5 mg by mouth every evening.)   levothyroxine (SYNTHROID) 25 MCG tablet Take 1 tablet (25 mcg total) by mouth daily before breakfast.   loperamide HCl (IMODIUM A-D) 1 MG/7.5ML solution Take 15 mLs (2 mg total) by mouth as needed for diarrhea or loose stools.   Menthol-Ascorbic Acid (LUDENS COUGH DROPS) 3-60 MG LOZG Take 1 every 2-4 hours as needed   metoprolol tartrate (LOPRESSOR) 25 MG tablet Take 1 tablet (25 mg total) by mouth 2 (two) times daily.   multivitamin-iron-minerals-folic acid (CENTRUM) chewable tablet Chew 1 tablet by mouth daily.   ondansetron (ZOFRAN) 4 MG tablet Take 4 mg by mouth every 8 (eight) hours as needed for nausea or vomiting.   warfarin (COUMADIN) 7.5 MG tablet 1/2 tab M,W, F and 1 tablet on T,Th,S,Su  (Patient taking differently: Take 3.75-7.5 mg by mouth See admin instructions. Take 1/2 tablet (3.75 mg) on Mon,Wed, and Fri. THEN Take 1 tablet (7.5 mg) on Tues, Thurs, Sat, and Sun)    Allergies: Patient is allergic to azithromycin, codeine, and erythromycin. Family History: Patient family history includes Allergic rhinitis in her sister; Breast cancer (age of onset: 59) in her daughter; Cirrhosis in her father; Coronary artery disease in her maternal grandfather and paternal grandfather; Heart disease in her paternal grandmother; Lung cancer in her father; Skin cancer in her brother; Throat cancer in her maternal grandfather and paternal grandfather. Social History:  Patient  reports that she quit smoking about 64 years ago. Her smoking use included cigarettes. She has never used  smokeless tobacco. She reports current alcohol use. She reports that she does not use drugs.  Review of Systems: Constitutional: Negative for fever malaise or anorexia Cardiovascular: negative for chest pain Respiratory: negative for SOB or persistent cough Gastrointestinal: negative for abdominal pain  Objective  Vitals: BP 100/60   Pulse 76   Temp 98 F (36.7 C)   Ht 5' 3"$  (1.6 m)   Wt 121 lb 3.2 oz (55 kg)   LMP  (LMP Unknown)   SpO2 99%   BMI 21.47 kg/m  General: no acute distress , A&Ox3 HEENT: PEERL, conjunctiva normal, neck is supple Cardiovascular:  RRR with loud murmur Respiratory:  Good breath sounds bilaterally, CTAB with normal respiratory effort Abd: RUQ and midepigastrium are tender w/o rebound/guarding or masses. + bs. Soft.  Skin:  Warm, no rashes  Commons side effects, risks, benefits, and alternatives for medications and treatment plan prescribed today were discussed, and the patient expressed understanding of the given instructions. Patient is instructed to call or message via MyChart if he/she has any questions or concerns regarding our treatment plan. No barriers to understanding  were identified. We discussed Red Flag symptoms and signs in detail. Patient expressed understanding regarding what to do in case of urgent or emergency type symptoms.  Medication list was reconciled, printed and provided to the patient in AVS. Patient instructions and summary information was reviewed with the patient as documented in the AVS. This note was prepared with assistance of Dragon voice recognition software. Occasional wrong-word or sound-a-like substitutions may have occurred due to the inherent limitations of voice recognition software

## 2022-04-14 NOTE — Patient Instructions (Signed)
Please return in 3 months for recheck.   I will release your lab results to you on your MyChart account with further instructions. You may see the results before I do, but when I review them I will send you a message with my report or have my assistant call you if things need to be discussed. Please reply to my message with any questions. Thank you!   We will call wendy to get you set up for the ultrasound.  If you have any questions or concerns, please don't hesitate to send me a message via MyChart or call the office at [phone]. Thank you for visiting with [place] today! It's our pleasure caring for you.

## 2022-04-15 LAB — IRON,TIBC AND FERRITIN PANEL
%SAT: 78 % (calc) — ABNORMAL HIGH (ref 16–45)
Ferritin: 75 ng/mL (ref 16–288)
Iron: 269 ug/dL — ABNORMAL HIGH (ref 45–160)
TIBC: 344 mcg/dL (calc) (ref 250–450)

## 2022-04-15 NOTE — Progress Notes (Signed)
Please call patients daughter, Krystal Clark [phone]: I have reviewed his/her lab results. Her labs look better. Iron level is high so can stop oral supplements. Bilirubin level is now normal and her liver test is normal. Will get ultrasound to ensure everything looks ok. If abdominal pain doesn't improve with the pepcid, can try a stronger acid pill or refer to GI. Thanks.

## 2022-04-18 DIAGNOSIS — K219 Gastro-esophageal reflux disease without esophagitis: Secondary | ICD-10-CM | POA: Diagnosis not present

## 2022-04-18 DIAGNOSIS — I10 Essential (primary) hypertension: Secondary | ICD-10-CM | POA: Diagnosis not present

## 2022-04-18 DIAGNOSIS — I110 Hypertensive heart disease with heart failure: Secondary | ICD-10-CM | POA: Diagnosis not present

## 2022-04-21 ENCOUNTER — Telehealth: Payer: Self-pay | Admitting: Family Medicine

## 2022-04-21 NOTE — Telephone Encounter (Signed)
Caller states: Venda Rodes @ [facility] fax number is [phone]; They need PCP order to stop iron pill

## 2022-04-21 NOTE — Telephone Encounter (Signed)
Order faxed over to [facility] for pt to stop taking iron supplements

## 2022-04-28 DIAGNOSIS — M2042 Other hammer toe(s) (acquired), left foot: Secondary | ICD-10-CM | POA: Diagnosis not present

## 2022-04-28 DIAGNOSIS — Z7901 Long term (current) use of anticoagulants: Secondary | ICD-10-CM | POA: Diagnosis not present

## 2022-04-28 DIAGNOSIS — B351 Tinea unguium: Secondary | ICD-10-CM | POA: Diagnosis not present

## 2022-04-28 DIAGNOSIS — M79675 Pain in left toe(s): Secondary | ICD-10-CM | POA: Diagnosis not present

## 2022-04-29 ENCOUNTER — Encounter: Payer: Self-pay | Admitting: Family Medicine

## 2022-05-09 DIAGNOSIS — I110 Hypertensive heart disease with heart failure: Secondary | ICD-10-CM | POA: Diagnosis not present

## 2022-05-09 DIAGNOSIS — I10 Essential (primary) hypertension: Secondary | ICD-10-CM | POA: Diagnosis not present

## 2022-05-09 DIAGNOSIS — W010XXA Fall on same level from slipping, tripping and stumbling without subsequent striking against object, initial encounter: Secondary | ICD-10-CM | POA: Diagnosis not present

## 2022-05-11 DIAGNOSIS — I350 Nonrheumatic aortic (valve) stenosis: Secondary | ICD-10-CM | POA: Diagnosis not present

## 2022-05-11 DIAGNOSIS — Z86718 Personal history of other venous thrombosis and embolism: Secondary | ICD-10-CM | POA: Diagnosis not present

## 2022-05-11 DIAGNOSIS — D508 Other iron deficiency anemias: Secondary | ICD-10-CM | POA: Diagnosis not present

## 2022-05-11 DIAGNOSIS — E038 Other specified hypothyroidism: Secondary | ICD-10-CM | POA: Diagnosis not present

## 2022-05-11 DIAGNOSIS — E782 Mixed hyperlipidemia: Secondary | ICD-10-CM | POA: Diagnosis not present

## 2022-05-11 DIAGNOSIS — H401131 Primary open-angle glaucoma, bilateral, mild stage: Secondary | ICD-10-CM | POA: Diagnosis not present

## 2022-05-11 DIAGNOSIS — I10 Essential (primary) hypertension: Secondary | ICD-10-CM | POA: Diagnosis not present

## 2022-05-16 DIAGNOSIS — D508 Other iron deficiency anemias: Secondary | ICD-10-CM | POA: Diagnosis not present

## 2022-05-16 DIAGNOSIS — I509 Heart failure, unspecified: Secondary | ICD-10-CM | POA: Diagnosis not present

## 2022-05-16 DIAGNOSIS — E038 Other specified hypothyroidism: Secondary | ICD-10-CM | POA: Diagnosis not present

## 2022-05-16 DIAGNOSIS — R4182 Altered mental status, unspecified: Secondary | ICD-10-CM | POA: Diagnosis not present

## 2022-05-16 DIAGNOSIS — I110 Hypertensive heart disease with heart failure: Secondary | ICD-10-CM | POA: Diagnosis not present

## 2022-05-17 ENCOUNTER — Ambulatory Visit: Payer: Medicare Other | Attending: Cardiology

## 2022-05-17 DIAGNOSIS — Z86718 Personal history of other venous thrombosis and embolism: Secondary | ICD-10-CM

## 2022-05-17 DIAGNOSIS — D682 Hereditary deficiency of other clotting factors: Secondary | ICD-10-CM | POA: Diagnosis not present

## 2022-05-17 DIAGNOSIS — Z7901 Long term (current) use of anticoagulants: Secondary | ICD-10-CM

## 2022-05-17 LAB — POCT INR: INR: 2.9 (ref 2.0–3.0)

## 2022-05-17 NOTE — Patient Instructions (Signed)
Description   Continue taking warfarin 1/2 a tablet daily EXCEPT 1 tablet on Wednesdays.  Recheck INR in 6 weeks.  [facility] [phone] Please call [facility] with instruction [phone]      

## 2022-05-18 ENCOUNTER — Ambulatory Visit
Admission: RE | Admit: 2022-05-18 | Discharge: 2022-05-18 | Disposition: A | Discharge status: Home / Self Care | Payer: Medicare Other | Source: Ambulatory Visit | Attending: Family Medicine | Admitting: Family Medicine

## 2022-05-18 DIAGNOSIS — R112 Nausea with vomiting, unspecified: Secondary | ICD-10-CM

## 2022-05-18 DIAGNOSIS — R17 Unspecified jaundice: Secondary | ICD-10-CM

## 2022-05-18 DIAGNOSIS — R1011 Right upper quadrant pain: Secondary | ICD-10-CM

## 2022-05-18 DIAGNOSIS — R1013 Epigastric pain: Secondary | ICD-10-CM

## 2022-05-18 DIAGNOSIS — R1010 Upper abdominal pain, unspecified: Secondary | ICD-10-CM | POA: Diagnosis not present

## 2022-05-18 DIAGNOSIS — R110 Nausea: Secondary | ICD-10-CM | POA: Diagnosis not present

## 2022-05-24 ENCOUNTER — Other Ambulatory Visit: Payer: Self-pay

## 2022-05-24 NOTE — Progress Notes (Signed)
Please call patient's daughter [name] heins: [phone]: I have the ultrasound results: they are mostly unremarkable. Mild fatty liver but nothing else to cause her nausea. Is patient feeling any better?

## 2022-05-30 NOTE — Telephone Encounter (Signed)
Pt ready for scheduling for Prolia on or after : 06/17/22  Out-of-pocket cost due at time of visit: $0  Primary: [employer] Prolia co-insurance: 20% Admin fee co-insurance: 20%  Secondary: [employer] Med[employer] Prolia co-insurance: Covered by secondary Admin fee co-insurance: Covered by secondary  Medical Benefit Details: Date Benefits were checked: 05/26/22 Deductible: $240 met of $240 required/ Coinsurance: 20%/ Admin Fee: 20%  Prior Auth: N/A PA# Expiration Date:    Pharmacy benefit: Copay $--- If patient wants fill through the pharmacy benefit please send prescription to:  --- , and include estimated need by date in rx notes. Pharmacy will ship medication directly to the office.  Patient not eligible for Prolia Copay Card. Copay Card can make patient's cost as little as $25. Link to apply: [email or web address]://[email or web address]  ** This summary of benefits is an estimation of the patient's out-of-pocket cost. Exact cost may very based on individual plan coverage.

## 2022-05-31 ENCOUNTER — Ambulatory Visit (INDEPENDENT_AMBULATORY_CARE_PROVIDER_SITE_OTHER): Payer: Medicare Other | Admitting: Family Medicine

## 2022-05-31 ENCOUNTER — Encounter: Payer: Self-pay | Admitting: Family Medicine

## 2022-05-31 VITALS — BP 106/66 | HR 71 | Resp 15

## 2022-05-31 DIAGNOSIS — R4189 Other symptoms and signs involving cognitive functions and awareness: Secondary | ICD-10-CM

## 2022-05-31 DIAGNOSIS — R112 Nausea with vomiting, unspecified: Secondary | ICD-10-CM

## 2022-05-31 NOTE — Progress Notes (Signed)
Subjective  CC:  Chief Complaint  Patient presents with   Memory Loss    Increased agitation    HPI: Robin Harrell is a 89 y.o. female who presents to the office today to address the problems listed above in the chief complaint. Patient presents with her daughter Robin Harrell to discuss concerns about intermittent confusion.  See MyChart messages.  Patient's daughter reports that at times she is getting very confused about what year it is, month that is day of the week etc.  She will become frustrated if she thinks her medications are not being given on time.  She has had some problems remembering phone numbers of best friends which she has known for decades.  Robin Harrell reports that these things all used to be her strong point in the past.  Patient does become frustrated and can be quite feisty when she feels that she is being told something incorrect.  These problems wax and wane.  There are days of the week where she is very clear.  Other times she is making phone calls and not making sense.  The decline has been slow and gradual over the last 6 to 12 months.  It seems to be frequent and persistent over the last several months.  She is now living in a nursing home and the change is becoming more evident.  The people at the nursing home do not report anything unusual outside of early dementia changes.  Robin Harrell lives with her husband who is very hard of hearing and has poor vision and therefore the communication is almost 0.  She finds this very isolated and frustrating.  However, from the daughters report this is not a significant change from when they both lived at home. Recent nausea and vomiting: This has all resolved.  She has taken Pepcid for the last several months and I believe this has helped her problem.  I did review lab work and ultrasound findings. Lab Results  Component Value Date   ALT 15 04/14/2022   AST 19 04/14/2022   ALKPHOS 67 04/14/2022   BILITOT 1.0 04/14/2022   Lab Results   Component Value Date   WBC 5.8 04/14/2022   HGB 11.3 (L) 04/14/2022   HCT 33.6 (L) 04/14/2022   MCV 92.8 04/14/2022   PLT 145.0 (L) 04/14/2022   Lab Results  Component Value Date   CREATININE 0.88 04/14/2022   BUN 19 04/14/2022   NA 136 04/14/2022   K 4.2 04/14/2022   CL 104 04/14/2022   CO2 27 04/14/2022   Ultrasound showed fatty liver otherwise was mostly unremarkable.  Assessment  1. Alteration in cognition   2. Nausea and vomiting, unspecified vomiting type      Plan  Alterations in cognition: I reviewed several CTs of the brain that did show mild vascular ischemic changes.  She has not had a brain MRI.  Recent TSH from January was normal.  Given the waxing and waning, I suspect this is early cognitive changes of vascular dementia but given difficulties the family is having with management and because they live out of town, will agree that referral to neurology for further assessment and clarification could be beneficial.  Referral placed.  We discussed possibilities of starting medications however at this time will defer. Nausea and vomiting most likely related to GERD.  Treated with Pepcid and resolved. Of note, she had a recent UTI, Klebsiella pneumonia treated with Keflex.  Her daughter states her confusion may have been worse during  that week but it is hard to tell.  She completed antibiotics.  I did review lab results and culture results from [facility] phone.  Follow up: 6 months for complete physical Visit date not found  Orders Placed This Encounter  Procedures   Ambulatory referral to Neurology   No orders of the defined types were placed in this encounter.     I reviewed the patients updated PMH, FH, and SocHx.    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    Priority: High   Osteoporosis 02/09/2010    Priority: High   Essential hypertension 11/15/2007    Priority: High   Chronic venous stasis dermatitis of both lower extremities 12/31/2018    Priority: Medium    Severe aortic stenosis 02/07/2017    Priority: Medium    Chronic idiopathic thrombocytopenia 04/14/2016    Priority: Medium    Essential tremor 09/03/2015    Priority: Medium    Edema of right lower extremity 02/01/2013    Priority: Medium    Colon polyp 10/15/2012    Priority: Medium    Insomnia 10/11/2011    Priority: Medium    Osteoarthritis, multiple sites 06/30/2011    Priority: Medium    DJD (degenerative joint disease), lumbar 10/14/2009    Priority: Medium    Glaucoma 01/29/2008    Priority: Medium    History of DVT (deep vein thrombosis) 11/15/2007    Priority: Medium    GERD 11/15/2007    Priority: Medium    Chronic cough 11/15/2007    Priority: Medium    Primary osteoarthritis of both knees 11/18/2019    Priority: Low   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   atorvastatin (LIPITOR) 10 MG tablet TAKE 1 TABLET (10 MG TOTAL) BY MOUTH DAILY AT 6 PM. (Patient taking differently: Take 10 mg by mouth daily.)   brimonidine (ALPHAGAN) 0.2 % ophthalmic solution Place 1 drop into  both eyes 2 (two) times daily.   Calcium-Phosphorus-Vitamin D (CALCIUM/D3 ADULT GUMMIES) 200-96.6-200 MG-MG-UNIT CHEW Chew 2 tablets by mouth daily.   chlorpheniramine (CHLOR-TRIMETON) 4 MG tablet Take 4 mg by mouth 2 (two) times daily.   famotidine (PEPCID) 20 MG tablet Take 1 tablet (20 mg total) by mouth 2 (two) times daily.   fluticasone (FLONASE) 50 MCG/ACT nasal spray SPRAY 2 SPRAYS INTO EACH NOSTRIL EVERY DAY (Patient taking differently: Place 2 sprays into both nostrils daily.)   furosemide (LASIX) 20 MG tablet Take 1 tablet (20 mg total) by mouth daily as needed  for fluid.   ipratropium (ATROVENT) 0.03 % nasal spray Place 2 sprays into both nostrils 3 (three) times daily as needed for rhinitis.   latanoprost (XALATAN) 0.005 % ophthalmic solution Place 1 drop into both eyes at bedtime.    levocetirizine (XYZAL) 5 MG tablet TAKE 1 TABLET BY MOUTH EVERY DAY IN THE EVENING (Patient taking differently: Take 5 mg by mouth every evening.)   levothyroxine (SYNTHROID) 25 MCG tablet Take 1 tablet (25 mcg total) by mouth daily before breakfast.   loperamide HCl (IMODIUM A-D) 1 MG/7.5ML solution Take 15 mLs (2 mg total) by mouth as needed for diarrhea or loose stools.   Menthol-Ascorbic Acid (LUDENS COUGH DROPS) 3-60 MG LOZG Take 1 every 2-4 hours as needed   metoprolol tartrate (LOPRESSOR) 25 MG tablet Take 1 tablet (25 mg total) by mouth 2 (two) times daily.   multivitamin-iron-minerals-folic acid (CENTRUM) chewable tablet Chew 1 tablet by mouth daily.   ondansetron (ZOFRAN) 4 MG tablet Take 4 mg by mouth every 8 (eight) hours as needed for nausea or vomiting.   warfarin (COUMADIN) 7.5 MG tablet 1/2 tab M,W, F and 1 tablet on T,Th,S,Su (Patient taking differently: Take 3.75-7.5 mg by mouth See admin instructions. Take 1/2 tablet (3.75 mg) on Mon,Wed, and Fri. THEN Take 1 tablet (7.5 mg) on Tues, Thurs, Sat, and Sun)    Allergies: Patient is allergic to azithromycin, codeine, and  erythromycin. Family History: Patient family history includes Allergic rhinitis in her sister; Breast cancer (age of onset: 7947) in her daughter; Cirrhosis in her father; Coronary artery disease in her maternal grandfather and paternal grandfather; Heart disease in her paternal grandmother; Lung cancer in her father; Skin cancer in her brother; Throat cancer in her maternal grandfather and paternal grandfather. Social History:  Patient  reports that she quit smoking about 64 years ago. Her smoking use included cigarettes. She has never used smokeless tobacco. She reports current alcohol use. She reports that she does not use drugs.  Review of Systems: Constitutional: Negative for fever malaise or anorexia Cardiovascular: negative for chest pain Respiratory: negative for SOB or persistent cough Gastrointestinal: negative for abdominal pain  Objective  Vitals: BP 106/66   Pulse 71   Resp 15   LMP  (LMP Unknown)   SpO2 97%  General: no acute distress , A&Ox3, sitting in wheelchair comfortably Psych: Alert and oriented to person and place but not time   Today's 6CIT: Scored 7 of 28 (3 points for month, 2 formonths in reverse and 2 for repeat phrase) Reviewed prior scores.    01/06/2022   11:32 AM 12/25/2020   11:26 AM 12/05/2019    3:23 PM  6CIT Screen  What Year? 0 points 0 points 0 points  What month? 0 points 0 points 0 points  What time? 0 points 0 points    Count back from 20 0 points 0 points 0 points  Months in reverse 4 points 4 points 0 points  Repeat phrase 0 points 0 points 0 points  Total Score 4 points 4 points      Commons side effects, risks, benefits, and alternatives for medications and treatment plan prescribed today were discussed, and the patient expressed understanding of the given instructions. Patient is instructed to call or message via MyChart if he/she has any questions or concerns regarding our treatment plan. No barriers to understanding were identified. We  discussed Red Flag symptoms and signs in detail. Patient expressed understanding regarding what to do in case of  urgent or emergency type symptoms.  Medication list was reconciled, printed and provided to the patient in AVS. Patient instructions and summary information was reviewed with the patient as documented in the AVS. This note was prepared with assistance of Dragon voice recognition software. Occasional wrong-word or sound-a-like substitutions may have occurred due to the inherent limitations of voice recognition software

## 2022-05-31 NOTE — Telephone Encounter (Signed)
Vml for patient to call back and sch prolia shot - Out-of-pocket cost due at time of visit: $0

## 2022-05-31 NOTE — Patient Instructions (Signed)
Please return in 6 months for your annual complete physical; please come fasting. You may cancel the appointment for may.  We will call you with an appointment to see a neurologist.  If you have any questions or concerns, please don't hesitate to send me a message via MyChart or call the office at [phone]. Thank you for visiting with [place] today! It's our pleasure caring for you.   She may have early signs of vascular dementia: Vascular Dementia Dementia is a condition in which a person has problems with thinking, memory, and behavior that are severe enough to interfere with daily life. Vascular dementia is a type of dementia. It results from brain damage that is caused by the brain not getting enough blood. This condition may also be called vascular cognitive impairment. What are the causes? Vascular dementia is caused by conditions that reduce blood flow to the brain. Common causes of this condition include: Multiple small strokes. These may happen without symptoms (silent stroke). Major stroke. Damage to small blood vessels in the brain (cerebral small vessel disease). What increases the risk? The following factors may make you more likely to develop this condition: Having had a stroke. Having high blood pressure (hypertension) or high cholesterol. Having a disease that affects the heart or blood vessels. Smoking. Not being active. Being over age 87. Having any of these conditions: Diabetes. Metabolic syndrome. Obesity. Depression. A genetic condition that leads to stroke, such as CADASIL (cerebral autosomal dominant arteriopathy with subcortical infarcts and leukoencephalopathy). What are the signs or symptoms? Symptoms of vascular dementia can vary from one person to another. Symptoms may be mild or severe depending on the amount of damage and which parts of the brain have been affected. Symptoms may begin suddenly or may develop slowly. Mental symptoms may include: Confusion  and memory problems. Poor attention and concentration. Trouble understanding speech. Depression. Personality changes. Trouble recognizing familiar people. Agitation or aggression. Paranoia or false beliefs (delusions). Hallucinations. These involve seeing, hearing, tasting, smelling, or feeling things that are not real. Physical symptoms may include: Weakness. Poor balance. Loss of bladder or bowel control (incontinence). Unsteady walking (gait). Speaking problems. Behavioral symptoms may include: Getting lost in familiar places. Problems with planning and judgment. Trouble following instructions. Social problems. Emotional outbursts. Trouble with daily activities and self-care. Problems handling money. Symptoms may remain stable, or they may get worse over time. Symptoms of vascular dementia may be similar to those of Alzheimer's disease. The two conditions can occur together (mixed dementia). How is this diagnosed? This condition may be diagnosed based on: Your medical history and a physical exam. Symptoms or changes that are reported by friends and family. Lab tests or other tests that check brain and nervous system function. Tests may include: Blood tests. Brain imaging tests. Tests of movement, speech, and other daily activities (neurological exam). Tests of memory, thinking, and problem-solving (neuropsychological or neurocognitive testing). There is not a specific test to diagnose vascular dementia. Diagnosis may involve several specialists. These may include: A health care provider who specializes in the brain and nervous system (neurologist). A health care provider who specializes in understanding how problems in the brain can alter behavior and cognitive function (neuropsychologist). How is this treated? There is no cure for vascular dementia. Brain damage that has already occurred cannot be reversed. Treatment depends on: How severe the condition is. Which parts of  your brain have been affected. Your overall health. Treatment measures aim to: Treat the underlying cause of vascular dementia and  manage risk factors. This may include: Controlling blood pressure or lowering cholesterol. Treating diabetes. Making lifestyle changes, such as quitting smoking or losing weight. Manage symptoms. Prevent further brain damage. Improve the person's health and quality of life. Treatment for dementia may involve a team of health care providers, including: A neurologist. A provider who specializes in disorders of the mind (psychiatrist). A provider who specializes in helping people learn daily living skills (occupational therapist). A provider who focuses on speech and language changes (Doctor, general practice). A heart specialist (cardiologist). A provider who helps people learn how to manage physical changes, such as movement and walking (exercise physiologist or physical therapist). Follow these instructions at home: Medicines Take over-the-counter and prescription medicines only as told by your health care provider. Use a pill organizer or pill reminder to help you manage your medicines. Lifestyle Do not use any products that contain nicotine or tobacco, such as cigarettes, e-cigarettes, and chewing tobacco. If you need help quitting, ask your health care provider. Eat a healthy, balanced diet. Maintain a healthy weight, or lose weight if needed. Be physically active as told by your health care provider. General instructions Work with your health care provider to determine what you need help with and what your safety needs are. Follow the health care provider's instructions for treating the condition that caused the dementia. Keep all follow-up visits. This is important. If you are the caregiver:  People with vascular dementia may need regular help at home or daily care from a family member or home health care worker. Home care for a person with vascular  dementia depends on what caused the condition and how severe the symptoms are. General guidelines for caregivers include: Help the person with dementia remember people, appointments, and daily activities. Help the person with dementia manage his or her medicines. Help family and friends learn about ways to communicate with the person with dementia. Create a safe living space to reduce the risk of injury or falls. Find a support group to help caregivers and family cope with the effects of dementia.  Where to find more information [employer] of Neurological Disorders and Stroke: [employer] Contact a health care provider if: New behavioral problems develop. Problems with swallowing develop. Confusion gets worse. Sleepiness gets worse. Get help right away if: Loss of consciousness occurs. There is a sudden loss of speech, balance, or thinking ability. New numbness or paralysis occurs. Sudden, severe headache occurs. Vision is lost or suddenly gets worse in one or both eyes. If you ever feel like the person with dementia may hurt himself or herself or others, or if he or she shares thoughts about taking his or her own life, get help right away. You can go to your nearest emergency department or: Call your local emergency services (911 in the [place] Call a suicide crisis helpline, such as the National Suicide Prevention Lifeline at 334-079-7762 or 988 in the [place] This is open 24 hours a day in the [place] Text the Crisis Text Line at 6714949334 (in the [place] Summary Vascular dementia is a type of dementia. It results from brain damage that is caused by the brain not getting enough blood. Vascular dementia is caused by conditions that reduce blood flow to the brain. Common causes of this condition include stroke and damage to small blood vessels in the brain. Treatment focuses on treating the underlying cause of vascular dementia and managing any risk factors. People with vascular  dementia may need regular help at home or daily  care from a family member or home health care worker. Contact a health care provider if you or your caregiver notices any new symptoms. This information is not intended to replace advice given to you by your health care provider. Make sure you discuss any questions you have with your health care provider. Document Revised: 09/02/2020 Document Reviewed: 06/24/2019 Elsevier Patient Education  2023 [employer]

## 2022-06-13 DIAGNOSIS — K219 Gastro-esophageal reflux disease without esophagitis: Secondary | ICD-10-CM | POA: Diagnosis not present

## 2022-06-13 DIAGNOSIS — I509 Heart failure, unspecified: Secondary | ICD-10-CM | POA: Diagnosis not present

## 2022-06-13 DIAGNOSIS — I110 Hypertensive heart disease with heart failure: Secondary | ICD-10-CM | POA: Diagnosis not present

## 2022-06-21 DIAGNOSIS — D6851 Activated protein C resistance: Secondary | ICD-10-CM | POA: Diagnosis not present

## 2022-06-21 DIAGNOSIS — D508 Other iron deficiency anemias: Secondary | ICD-10-CM | POA: Diagnosis not present

## 2022-06-22 DIAGNOSIS — Z993 Dependence on wheelchair: Secondary | ICD-10-CM | POA: Diagnosis not present

## 2022-06-22 DIAGNOSIS — I5032 Chronic diastolic (congestive) heart failure: Secondary | ICD-10-CM | POA: Diagnosis not present

## 2022-06-22 DIAGNOSIS — H401131 Primary open-angle glaucoma, bilateral, mild stage: Secondary | ICD-10-CM | POA: Diagnosis not present

## 2022-06-22 DIAGNOSIS — Z7901 Long term (current) use of anticoagulants: Secondary | ICD-10-CM | POA: Diagnosis not present

## 2022-06-22 DIAGNOSIS — E782 Mixed hyperlipidemia: Secondary | ICD-10-CM | POA: Diagnosis not present

## 2022-06-22 DIAGNOSIS — I350 Nonrheumatic aortic (valve) stenosis: Secondary | ICD-10-CM | POA: Diagnosis not present

## 2022-06-22 DIAGNOSIS — D508 Other iron deficiency anemias: Secondary | ICD-10-CM | POA: Diagnosis not present

## 2022-06-22 DIAGNOSIS — E038 Other specified hypothyroidism: Secondary | ICD-10-CM | POA: Diagnosis not present

## 2022-06-22 DIAGNOSIS — I110 Hypertensive heart disease with heart failure: Secondary | ICD-10-CM | POA: Diagnosis not present

## 2022-06-22 DIAGNOSIS — Z86718 Personal history of other venous thrombosis and embolism: Secondary | ICD-10-CM | POA: Diagnosis not present

## 2022-06-28 ENCOUNTER — Ambulatory Visit: Payer: Medicare Other | Attending: Cardiology | Admitting: *Deleted

## 2022-06-28 DIAGNOSIS — Z7901 Long term (current) use of anticoagulants: Secondary | ICD-10-CM | POA: Insufficient documentation

## 2022-06-28 DIAGNOSIS — Z5181 Encounter for therapeutic drug level monitoring: Secondary | ICD-10-CM | POA: Diagnosis not present

## 2022-06-28 LAB — POCT INR: POC INR: 2.9

## 2022-06-28 NOTE — Patient Instructions (Signed)
Description   Continue taking warfarin 1/2 a tablet daily EXCEPT 1 tablet on Wednesdays.  Recheck INR in 6 weeks.  [facility] [phone] Please call [facility] with instruction [phone]      

## 2022-07-05 ENCOUNTER — Emergency Department (HOSPITAL_COMMUNITY)
Admission: EM | Admit: 2022-07-05 | Discharge: 2022-07-05 | Disposition: A | Discharge status: Home / Self Care | Payer: Medicare Other | Attending: Emergency Medicine | Admitting: Emergency Medicine

## 2022-07-05 ENCOUNTER — Other Ambulatory Visit: Payer: Self-pay

## 2022-07-05 DIAGNOSIS — R791 Abnormal coagulation profile: Secondary | ICD-10-CM | POA: Diagnosis not present

## 2022-07-05 DIAGNOSIS — W010XXA Fall on same level from slipping, tripping and stumbling without subsequent striking against object, initial encounter: Secondary | ICD-10-CM | POA: Diagnosis not present

## 2022-07-05 DIAGNOSIS — S61411A Laceration without foreign body of right hand, initial encounter: Secondary | ICD-10-CM | POA: Diagnosis not present

## 2022-07-05 DIAGNOSIS — W19XXXA Unspecified fall, initial encounter: Secondary | ICD-10-CM

## 2022-07-05 DIAGNOSIS — R58 Hemorrhage, not elsewhere classified: Secondary | ICD-10-CM | POA: Diagnosis not present

## 2022-07-05 LAB — BASIC METABOLIC PANEL
Anion gap: 10 (ref 5–15)
BUN: 20 mg/dL (ref 8–23)
CO2: 22 mmol/L (ref 22–32)
Calcium: 9.3 mg/dL (ref 8.9–10.3)
Chloride: 106 mmol/L (ref 98–111)
Creatinine, Ser: 1.1 mg/dL — ABNORMAL HIGH (ref 0.44–1.00)
GFR, Estimated: 49 mL/min — ABNORMAL LOW (ref >60)
Glucose, Bld: 92 mg/dL (ref 70–99)
Potassium: 4.2 mmol/L (ref 3.5–5.1)
Sodium: 138 mmol/L (ref 135–145)

## 2022-07-05 LAB — CBC WITH DIFFERENTIAL/PLATELET
Abs Immature Granulocytes: 0.01 10*3/uL (ref 0.00–0.07)
Basophils Absolute: 0.1 10*3/uL (ref 0.0–0.1)
Basophils Relative: 1 %
Eosinophils Absolute: 0.2 10*3/uL (ref 0.0–0.5)
Eosinophils Relative: 3 %
HCT: 39 % (ref 36.0–46.0)
Hemoglobin: 12 g/dL (ref 12.0–15.0)
Immature Granulocytes: 0 %
Lymphocytes Relative: 13 %
Lymphs Abs: 0.8 10*3/uL (ref 0.7–4.0)
MCH: 30.9 pg (ref 26.0–34.0)
MCHC: 30.8 g/dL (ref 30.0–36.0)
MCV: 100.5 fL — ABNORMAL HIGH (ref 80.0–100.0)
Monocytes Absolute: 0.7 10*3/uL (ref 0.1–1.0)
Monocytes Relative: 11 %
Neutro Abs: 4.6 10*3/uL (ref 1.7–7.7)
Neutrophils Relative %: 72 %
Platelets: 154 10*3/uL (ref 150–400)
RBC: 3.88 MIL/uL (ref 3.87–5.11)
RDW: 13.3 % (ref 11.5–15.5)
WBC: 6.3 10*3/uL (ref 4.0–10.5)
nRBC: 0 % (ref 0.0–0.2)

## 2022-07-05 LAB — PROTIME-INR
INR: 3 — ABNORMAL HIGH (ref 0.8–1.2)
Prothrombin Time: 31 seconds — ABNORMAL HIGH (ref 11.4–15.2)

## 2022-07-05 NOTE — ED Triage Notes (Signed)
Pt to the ed from home that is an assisted living facility. Pt had a fall while walking out of the bathroom. Pt takes a daily blood thinners. Pt relays the skin tear on her hand is somewhat painful but has no other complaints at this time. Pt denies hitting head, loc,  Pt is A&Ox4

## 2022-07-05 NOTE — Discharge Instructions (Signed)
Your INR was 3.0. please skip a dose and return to your normal schedule of coumadin.  Do not scratch, rub, or pick at the adhesive. Leave tissue adhesive in place. It will come off naturally after 7-10 days. Do not place tape over the adhesive. The adhesive could come off the wound when you pull the tape off. Protect the wound from further injury until it is healed. Check your wound area every day for signs of infection. Check for: More redness, swelling, or pain,Fluid or blood,Warmth, Pus or a bad smell. Do not take baths, swim, or use a hot tub until your health care provider approves. You may only be allowed to take sponge baths. Ask your health care provider if you may take showers.You can usually shower after the first 24 hours. Cover the dressing with a watertight covering when you take a shower. Do not soak the area where there is tissue adhesive. Do not use any soaps, petroleum jelly products, or ointments on the wound. Certain ointments can weaken the adhesive.

## 2022-07-05 NOTE — ED Provider Notes (Signed)
[place] EMERGENCY DEPARTMENT AT [facility] Provider Note   CSN: [id number] Arrival date & time: 07/05/22  1126     History  Chief Complaint  Patient presents with   Robin Harrell    Robin Harrell is a 89 y.o. female who presents emergency department with fall.  Patient had a mechanical fall today at her assisted living facility and suffered a skin tear to her right hand.  She is able to get back up.  She does not have any significant pain.  She is anticoagulated on Coumadin.  She did not hit her head or lose consciousness.   Fall       Home Medications Prior to Admission medications   Medication Sig Start Date End Date Taking? Authorizing Provider  acetaminophen (TYLENOL) 325 MG tablet Take 1-2 tablets (325-650 mg total) by mouth every 6 (six) hours as needed for mild pain (or Fever >/= 101). 12/08/21   Willow Ora, MD  atorvastatin (LIPITOR) 10 MG tablet TAKE 1 TABLET (10 MG TOTAL) BY MOUTH DAILY AT 6 PM. Patient taking differently: Take 10 mg by mouth daily. 04/08/21   Runell Gess, MD  brimonidine (ALPHAGAN) 0.2 % ophthalmic solution Place 1 drop into both eyes 2 (two) times daily. 11/23/21   Willow Ora, MD  Calcium-Phosphorus-Vitamin D (CALCIUM/D3 ADULT GUMMIES) 200-96.6-200 MG-MG-UNIT CHEW Chew 2 tablets by mouth daily.    [provider]  chlorpheniramine (CHLOR-TRIMETON) 4 MG tablet Take 4 mg by mouth 2 (two) times daily.    [provider]  famotidine (PEPCID) 20 MG tablet Take 1 tablet (20 mg total) by mouth 2 (two) times daily. 03/21/22   Willow Ora, MD  fluticasone (FLONASE) 50 MCG/ACT nasal spray SPRAY 2 SPRAYS INTO EACH NOSTRIL EVERY DAY Patient taking differently: Place 2 sprays into both nostrils daily. 10/22/21   Mannam, Colbert Coyer, MD  furosemide (LASIX) 20 MG tablet Take 1 tablet (20 mg total) by mouth daily as needed for fluid. 10/27/21   Elgergawy, Leana Roe, MD  ipratropium (ATROVENT) 0.03 % nasal spray Place 2 sprays into  both nostrils 3 (three) times daily as needed for rhinitis. 11/29/21   Mannam, Colbert Coyer, MD  latanoprost (XALATAN) 0.005 % ophthalmic solution Place 1 drop into both eyes at bedtime.  10/08/10   [provider]  levocetirizine (XYZAL) 5 MG tablet TAKE 1 TABLET BY MOUTH EVERY DAY IN THE EVENING Patient taking differently: Take 5 mg by mouth every evening. 06/14/21   Willow Ora, MD  levothyroxine (SYNTHROID) 25 MCG tablet Take 1 tablet (25 mcg total) by mouth daily before breakfast. 06/18/21   Willow Ora, MD  loperamide HCl (IMODIUM A-D) 1 MG/7.5ML solution Take 15 mLs (2 mg total) by mouth as needed for diarrhea or loose stools. 12/29/21   Willow Ora, MD  Menthol-Ascorbic Acid (LUDENS COUGH DROPS) 3-60 MG LOZG Take 1 every 2-4 hours as needed 12/08/21   Willow Ora, MD  metoprolol tartrate (LOPRESSOR) 25 MG tablet Take 1 tablet (25 mg total) by mouth 2 (two) times daily. 05/17/21   Runell Gess, MD  multivitamin-iron-minerals-folic acid (CENTRUM) chewable tablet Chew 1 tablet by mouth daily.    [provider]  ondansetron (ZOFRAN) 4 MG tablet Take 4 mg by mouth every 8 (eight) hours as needed for nausea or vomiting.    [provider]  warfarin (COUMADIN) 7.5 MG tablet 1/2 tab M,W, F and 1 tablet on T,Th,S,Su Patient taking differently: Take 3.75-7.5 mg  by mouth See admin instructions. Take 1/2 tablet (3.75 mg) on Mon,Wed, and Fri. THEN Take 1 tablet (7.5 mg) on Tues, Thurs, Sat, and Sun 10/19/21   Willow Ora, MD      Allergies    Azithromycin, Codeine, and Erythromycin    Review of Systems   Review of Systems  Physical Exam Updated Vital Signs BP (!) 150/89   Pulse 89   Temp 98.3 F (36.8 C) (Oral)   Resp 18   Ht 5\' 3"  (1.6 m)   Wt 55 kg   LMP  (LMP Unknown)   SpO2 95%   BMI 21.48 kg/m  Physical Exam Vitals and nursing note reviewed.  Constitutional:      General: She is not in acute distress.    Appearance: She is well-developed.  She is not diaphoretic.  HENT:     Head: Normocephalic and atraumatic.     Right Ear: External ear normal.     Left Ear: External ear normal.     Nose: Nose normal.     Mouth/Throat:     Mouth: Mucous membranes are moist.  Eyes:     General: No scleral icterus.    Conjunctiva/sclera: Conjunctivae normal.  Cardiovascular:     Rate and Rhythm: Normal rate and regular rhythm.     Heart sounds: Normal heart sounds. No murmur heard.    No friction rub. No gallop.  Pulmonary:     Effort: Pulmonary effort is normal. No respiratory distress.     Breath sounds: Normal breath sounds.  Abdominal:     General: Bowel sounds are normal. There is no distension.     Palpations: Abdomen is soft. There is no mass.     Tenderness: There is no abdominal tenderness. There is no guarding.  Musculoskeletal:     Cervical back: Normal range of motion.     Comments: Full range of motion of all joints without deformity.  2 cm laceration of the dorsal surface of the right hand, full range of motion of fingers with normal strength.  It is up-to-date`  Skin:    General: Skin is warm and dry.  Neurological:     Mental Status: She is alert and oriented to person, place, and time.  Psychiatric:        Behavior: Behavior normal.     ED Results / Procedures / Treatments   Labs (all labs ordered are listed, but only abnormal results are displayed) Labs Reviewed  BASIC METABOLIC PANEL - Abnormal; Notable for the following components:      Result Value   Creatinine, Ser 1.10 (*)    GFR, Estimated 49 (*)    All other components within normal limits  CBC WITH DIFFERENTIAL/PLATELET - Abnormal; Notable for the following components:   MCV 100.5 (*)    All other components within normal limits  PROTIME-INR - Abnormal; Notable for the following components:   Prothrombin Time 31.0 (*)    INR 3.0 (*)    All other components within normal limits    EKG None  Radiology No results  found.  Procedures .Marland KitchenLaceration Repair  Date/Time: 07/05/2022 6:36 PM  Performed by: Arthor Captain, [name] Authorized by: Arthor Captain, [name]   Consent:    Consent obtained:  Verbal   Consent given by:  Patient   Risks discussed:  Need for additional repair, infection and nerve damage   Alternatives discussed:  No treatment Universal protocol:    Patient identity confirmed:  Arm band Anesthesia:  Anesthesia method:  None Laceration details:    Location:  Hand   Hand location:  R hand, dorsum   Length (cm):  3 Pre-procedure details:    Preparation:  Patient was prepped and draped in usual sterile fashion Exploration:    Wound exploration: wound explored through full range of motion and entire depth of wound visualized   Treatment:    Area cleansed with:  Shur-Clens   Amount of cleaning:  Standard   Irrigation solution:  Sterile saline Skin repair:    Repair method:  Tissue adhesive Approximation:    Approximation:  Close Repair type:    Repair type:  Simple Post-procedure details:    Dressing:  Open (no dressing)   Procedure completion:  Tolerated well, no immediate complications     Medications Ordered in ED Medications - No data to display  ED Course/ Medical Decision Making/ A&P                             Medical Decision Making Amount and/or Complexity of Data Reviewed Labs: ordered.   Patient here with fall.  No evidence of trauma to the body, no tenderness, moves all extremities without ataxia or weakness.  She had a small skin tear to the dorsum of the hand that I repaired with tissue adhesive.  She has a caretaker who is here to take her back to her assisted living facility.  Patient ambulates with assistive devices and is ambulatory.  He has a supratherapeutic INR and i is advised to skip a dose and then continue with her regularly scheduled medications.  Appropriate for discharge at time        Final Clinical Impression(s) / ED  Diagnoses Final diagnoses:  Fall, initial encounter  Skin tear of right hand without complication, initial encounter  Supratherapeutic INR    Rx / DC Orders ED Discharge Orders     None         Arthor Captain, [name] 07/05/22 Micheal Likens, MD 07/06/22 [phone]

## 2022-07-06 ENCOUNTER — Ambulatory Visit: Payer: Medicare Other | Admitting: Family Medicine

## 2022-07-11 DIAGNOSIS — Z7901 Long term (current) use of anticoagulants: Secondary | ICD-10-CM | POA: Diagnosis not present

## 2022-07-11 DIAGNOSIS — I110 Hypertensive heart disease with heart failure: Secondary | ICD-10-CM | POA: Diagnosis not present

## 2022-07-11 DIAGNOSIS — I5032 Chronic diastolic (congestive) heart failure: Secondary | ICD-10-CM | POA: Diagnosis not present

## 2022-07-11 DIAGNOSIS — K219 Gastro-esophageal reflux disease without esophagitis: Secondary | ICD-10-CM | POA: Diagnosis not present

## 2022-07-11 DIAGNOSIS — D6851 Activated protein C resistance: Secondary | ICD-10-CM | POA: Diagnosis not present

## 2022-07-25 DIAGNOSIS — I10 Essential (primary) hypertension: Secondary | ICD-10-CM | POA: Diagnosis not present

## 2022-07-25 DIAGNOSIS — R8290 Unspecified abnormal findings in urine: Secondary | ICD-10-CM | POA: Diagnosis not present

## 2022-07-25 DIAGNOSIS — I110 Hypertensive heart disease with heart failure: Secondary | ICD-10-CM | POA: Diagnosis not present

## 2022-07-25 DIAGNOSIS — I5032 Chronic diastolic (congestive) heart failure: Secondary | ICD-10-CM | POA: Diagnosis not present

## 2022-07-25 DIAGNOSIS — Z7901 Long term (current) use of anticoagulants: Secondary | ICD-10-CM | POA: Diagnosis not present

## 2022-07-25 DIAGNOSIS — D6851 Activated protein C resistance: Secondary | ICD-10-CM | POA: Diagnosis not present

## 2022-07-27 ENCOUNTER — Encounter: Payer: Self-pay | Admitting: Cardiovascular Disease

## 2022-07-27 DIAGNOSIS — R2689 Other abnormalities of gait and mobility: Secondary | ICD-10-CM | POA: Diagnosis not present

## 2022-07-27 DIAGNOSIS — R296 Repeated falls: Secondary | ICD-10-CM | POA: Diagnosis not present

## 2022-07-27 DIAGNOSIS — R293 Abnormal posture: Secondary | ICD-10-CM | POA: Diagnosis not present

## 2022-07-27 DIAGNOSIS — R2681 Unsteadiness on feet: Secondary | ICD-10-CM | POA: Diagnosis not present

## 2022-07-27 DIAGNOSIS — M6259 Muscle wasting and atrophy, not elsewhere classified, multiple sites: Secondary | ICD-10-CM | POA: Diagnosis not present

## 2022-07-28 DIAGNOSIS — R293 Abnormal posture: Secondary | ICD-10-CM | POA: Diagnosis not present

## 2022-07-28 DIAGNOSIS — R296 Repeated falls: Secondary | ICD-10-CM | POA: Diagnosis not present

## 2022-07-28 DIAGNOSIS — R2689 Other abnormalities of gait and mobility: Secondary | ICD-10-CM | POA: Diagnosis not present

## 2022-07-28 DIAGNOSIS — R2681 Unsteadiness on feet: Secondary | ICD-10-CM | POA: Diagnosis not present

## 2022-07-28 DIAGNOSIS — M6259 Muscle wasting and atrophy, not elsewhere classified, multiple sites: Secondary | ICD-10-CM | POA: Diagnosis not present

## 2022-07-28 NOTE — Telephone Encounter (Signed)
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 (Unless her history shows any history of VTE while on Eliquis - medication failure).   Based on her age and weight, she would need to be on 2.5 mg bid

## 2022-07-29 DIAGNOSIS — R2681 Unsteadiness on feet: Secondary | ICD-10-CM | POA: Diagnosis not present

## 2022-07-29 DIAGNOSIS — R293 Abnormal posture: Secondary | ICD-10-CM | POA: Diagnosis not present

## 2022-07-29 DIAGNOSIS — R2689 Other abnormalities of gait and mobility: Secondary | ICD-10-CM | POA: Diagnosis not present

## 2022-07-29 DIAGNOSIS — R296 Repeated falls: Secondary | ICD-10-CM | POA: Diagnosis not present

## 2022-07-29 DIAGNOSIS — M6259 Muscle wasting and atrophy, not elsewhere classified, multiple sites: Secondary | ICD-10-CM | POA: Diagnosis not present

## 2022-08-01 DIAGNOSIS — R2689 Other abnormalities of gait and mobility: Secondary | ICD-10-CM | POA: Diagnosis not present

## 2022-08-01 DIAGNOSIS — R2681 Unsteadiness on feet: Secondary | ICD-10-CM | POA: Diagnosis not present

## 2022-08-01 DIAGNOSIS — R296 Repeated falls: Secondary | ICD-10-CM | POA: Diagnosis not present

## 2022-08-01 DIAGNOSIS — M6259 Muscle wasting and atrophy, not elsewhere classified, multiple sites: Secondary | ICD-10-CM | POA: Diagnosis not present

## 2022-08-01 DIAGNOSIS — R293 Abnormal posture: Secondary | ICD-10-CM | POA: Diagnosis not present

## 2022-08-03 DIAGNOSIS — R296 Repeated falls: Secondary | ICD-10-CM | POA: Diagnosis not present

## 2022-08-03 DIAGNOSIS — M6259 Muscle wasting and atrophy, not elsewhere classified, multiple sites: Secondary | ICD-10-CM | POA: Diagnosis not present

## 2022-08-03 DIAGNOSIS — R293 Abnormal posture: Secondary | ICD-10-CM | POA: Diagnosis not present

## 2022-08-03 DIAGNOSIS — R2689 Other abnormalities of gait and mobility: Secondary | ICD-10-CM | POA: Diagnosis not present

## 2022-08-03 DIAGNOSIS — R2681 Unsteadiness on feet: Secondary | ICD-10-CM | POA: Diagnosis not present

## 2022-08-04 DIAGNOSIS — R293 Abnormal posture: Secondary | ICD-10-CM | POA: Diagnosis not present

## 2022-08-04 DIAGNOSIS — M6259 Muscle wasting and atrophy, not elsewhere classified, multiple sites: Secondary | ICD-10-CM | POA: Diagnosis not present

## 2022-08-04 DIAGNOSIS — R2681 Unsteadiness on feet: Secondary | ICD-10-CM | POA: Diagnosis not present

## 2022-08-04 DIAGNOSIS — R2689 Other abnormalities of gait and mobility: Secondary | ICD-10-CM | POA: Diagnosis not present

## 2022-08-04 DIAGNOSIS — R296 Repeated falls: Secondary | ICD-10-CM | POA: Diagnosis not present

## 2022-08-05 DIAGNOSIS — M6259 Muscle wasting and atrophy, not elsewhere classified, multiple sites: Secondary | ICD-10-CM | POA: Diagnosis not present

## 2022-08-05 DIAGNOSIS — R2681 Unsteadiness on feet: Secondary | ICD-10-CM | POA: Diagnosis not present

## 2022-08-05 DIAGNOSIS — R2689 Other abnormalities of gait and mobility: Secondary | ICD-10-CM | POA: Diagnosis not present

## 2022-08-05 DIAGNOSIS — R293 Abnormal posture: Secondary | ICD-10-CM | POA: Diagnosis not present

## 2022-08-05 DIAGNOSIS — R296 Repeated falls: Secondary | ICD-10-CM | POA: Diagnosis not present

## 2022-08-07 ENCOUNTER — Other Ambulatory Visit: Payer: Self-pay | Admitting: Nurse Practitioner

## 2022-08-09 ENCOUNTER — Ambulatory Visit: Payer: Medicare Other | Attending: Cardiology

## 2022-08-09 DIAGNOSIS — Z7901 Long term (current) use of anticoagulants: Secondary | ICD-10-CM | POA: Diagnosis not present

## 2022-08-09 DIAGNOSIS — R2689 Other abnormalities of gait and mobility: Secondary | ICD-10-CM | POA: Diagnosis not present

## 2022-08-09 DIAGNOSIS — Z86718 Personal history of other venous thrombosis and embolism: Secondary | ICD-10-CM | POA: Diagnosis not present

## 2022-08-09 DIAGNOSIS — R293 Abnormal posture: Secondary | ICD-10-CM | POA: Diagnosis not present

## 2022-08-09 DIAGNOSIS — D682 Hereditary deficiency of other clotting factors: Secondary | ICD-10-CM | POA: Diagnosis not present

## 2022-08-09 DIAGNOSIS — R296 Repeated falls: Secondary | ICD-10-CM | POA: Diagnosis not present

## 2022-08-09 DIAGNOSIS — M6259 Muscle wasting and atrophy, not elsewhere classified, multiple sites: Secondary | ICD-10-CM | POA: Diagnosis not present

## 2022-08-09 DIAGNOSIS — R2681 Unsteadiness on feet: Secondary | ICD-10-CM | POA: Diagnosis not present

## 2022-08-09 LAB — POCT INR: INR: 2 (ref 2.0–3.0)

## 2022-08-09 NOTE — Patient Instructions (Signed)
Stop Coumadin today and start Eliquis in the morning of 6/19. [facility] [phone] Please call [place] with instruction [phone]

## 2022-08-10 ENCOUNTER — Ambulatory Visit: Payer: Medicare Other | Admitting: Neurology

## 2022-08-10 DIAGNOSIS — R296 Repeated falls: Secondary | ICD-10-CM | POA: Diagnosis not present

## 2022-08-10 DIAGNOSIS — R2681 Unsteadiness on feet: Secondary | ICD-10-CM | POA: Diagnosis not present

## 2022-08-10 DIAGNOSIS — R293 Abnormal posture: Secondary | ICD-10-CM | POA: Diagnosis not present

## 2022-08-10 DIAGNOSIS — R2689 Other abnormalities of gait and mobility: Secondary | ICD-10-CM | POA: Diagnosis not present

## 2022-08-10 DIAGNOSIS — M6259 Muscle wasting and atrophy, not elsewhere classified, multiple sites: Secondary | ICD-10-CM | POA: Diagnosis not present

## 2022-08-11 DIAGNOSIS — H401131 Primary open-angle glaucoma, bilateral, mild stage: Secondary | ICD-10-CM | POA: Diagnosis not present

## 2022-08-12 DIAGNOSIS — M6259 Muscle wasting and atrophy, not elsewhere classified, multiple sites: Secondary | ICD-10-CM | POA: Diagnosis not present

## 2022-08-12 DIAGNOSIS — R2681 Unsteadiness on feet: Secondary | ICD-10-CM | POA: Diagnosis not present

## 2022-08-12 DIAGNOSIS — R296 Repeated falls: Secondary | ICD-10-CM | POA: Diagnosis not present

## 2022-08-12 DIAGNOSIS — R2689 Other abnormalities of gait and mobility: Secondary | ICD-10-CM | POA: Diagnosis not present

## 2022-08-12 DIAGNOSIS — R293 Abnormal posture: Secondary | ICD-10-CM | POA: Diagnosis not present

## 2022-08-15 DIAGNOSIS — Z7901 Long term (current) use of anticoagulants: Secondary | ICD-10-CM | POA: Diagnosis not present

## 2022-08-15 DIAGNOSIS — I110 Hypertensive heart disease with heart failure: Secondary | ICD-10-CM | POA: Diagnosis not present

## 2022-08-15 DIAGNOSIS — E038 Other specified hypothyroidism: Secondary | ICD-10-CM | POA: Diagnosis not present

## 2022-08-15 DIAGNOSIS — D6851 Activated protein C resistance: Secondary | ICD-10-CM | POA: Diagnosis not present

## 2022-08-15 DIAGNOSIS — I5032 Chronic diastolic (congestive) heart failure: Secondary | ICD-10-CM | POA: Diagnosis not present

## 2022-08-16 DIAGNOSIS — M6259 Muscle wasting and atrophy, not elsewhere classified, multiple sites: Secondary | ICD-10-CM | POA: Diagnosis not present

## 2022-08-16 DIAGNOSIS — R2689 Other abnormalities of gait and mobility: Secondary | ICD-10-CM | POA: Diagnosis not present

## 2022-08-16 DIAGNOSIS — R296 Repeated falls: Secondary | ICD-10-CM | POA: Diagnosis not present

## 2022-08-16 DIAGNOSIS — R2681 Unsteadiness on feet: Secondary | ICD-10-CM | POA: Diagnosis not present

## 2022-08-16 DIAGNOSIS — R293 Abnormal posture: Secondary | ICD-10-CM | POA: Diagnosis not present

## 2022-08-17 DIAGNOSIS — M6259 Muscle wasting and atrophy, not elsewhere classified, multiple sites: Secondary | ICD-10-CM | POA: Diagnosis not present

## 2022-08-17 DIAGNOSIS — R293 Abnormal posture: Secondary | ICD-10-CM | POA: Diagnosis not present

## 2022-08-17 DIAGNOSIS — R296 Repeated falls: Secondary | ICD-10-CM | POA: Diagnosis not present

## 2022-08-17 DIAGNOSIS — R2681 Unsteadiness on feet: Secondary | ICD-10-CM | POA: Diagnosis not present

## 2022-08-17 DIAGNOSIS — R2689 Other abnormalities of gait and mobility: Secondary | ICD-10-CM | POA: Diagnosis not present

## 2022-08-18 DIAGNOSIS — R293 Abnormal posture: Secondary | ICD-10-CM | POA: Diagnosis not present

## 2022-08-18 DIAGNOSIS — R2689 Other abnormalities of gait and mobility: Secondary | ICD-10-CM | POA: Diagnosis not present

## 2022-08-18 DIAGNOSIS — M6259 Muscle wasting and atrophy, not elsewhere classified, multiple sites: Secondary | ICD-10-CM | POA: Diagnosis not present

## 2022-08-18 DIAGNOSIS — R296 Repeated falls: Secondary | ICD-10-CM | POA: Diagnosis not present

## 2022-08-18 DIAGNOSIS — R2681 Unsteadiness on feet: Secondary | ICD-10-CM | POA: Diagnosis not present

## 2022-08-21 DIAGNOSIS — R4182 Altered mental status, unspecified: Secondary | ICD-10-CM | POA: Diagnosis not present

## 2022-08-31 ENCOUNTER — Telehealth: Payer: Self-pay | Admitting: Cardiovascular Disease

## 2022-08-31 NOTE — Telephone Encounter (Signed)
[facility] called and said they were calling to see if Dr. Allyson Sabal or team was looking or requesting for a CD of the patients X-Ray

## 2022-08-31 NOTE — Telephone Encounter (Signed)
Paper Work Dropped Off: [facility] Dropped off CD of patient's x-rays  Date: 07.10.24 @ 3:30pm  Location of paper: Dr Hazle Coca Mailbox

## 2022-08-31 NOTE — Telephone Encounter (Signed)
Ortho will bring over Xray 2d of patient's that was mixed up in the mail

## 2022-09-21 ENCOUNTER — Encounter: Payer: Self-pay | Admitting: Cardiovascular Disease

## 2022-09-21 NOTE — Telephone Encounter (Signed)
The Child psychotherapist returned call. She stated that there are no records/info that is needed at this time. It is just that the patient's family would like to speak with Dr Allyson Sabal (her Cardiologist before she moved) about the procedures that are being proposed to be done. Risk vs Benefit. They trust Dr Allyson Sabal and would really be grateful for his advise/consult.   Informed her that I would send this information to Dr Allyson Sabal.

## 2022-09-21 NOTE — Telephone Encounter (Signed)
Spoke with [name] (dpr) she was not sure exactly what information that the hospital was looking for. Will call the Social worker listed to see what information they are needing.

## 2022-09-23 NOTE — Telephone Encounter (Signed)
Dr. Allyson Sabal reached out and was able to speak with pt's daughter.

## 2022-12-27 ENCOUNTER — Telehealth: Payer: Self-pay | Admitting: Family Medicine

## 2022-12-27 NOTE — Telephone Encounter (Signed)
Dr Mardelle Matte the patients daughter wanted to let you know she has moved the patient to [facility] to be closer to her since her spouse passed away.  The patient is also on [facility] now.  She wanted to let you know how much her mom,the patient, loved you and hated to leave your care.  Thank you, Gabriel Cirri [facility] TEAM Direct Dial [phone]

## 2023-03-28 NOTE — Progress Notes (Signed)
PUO

## 2023-06-22 DEATH — deceased

## 2023-11-29 IMAGING — DX DG CHEST 2V
3 series · 3 of 3 positions shown · non-contrast
Comparison: 06/08/2020

CLINICAL DATA: Left pleural effusion.  Increase cough.

EXAM:
CHEST - 2 VIEW

[chest pa]
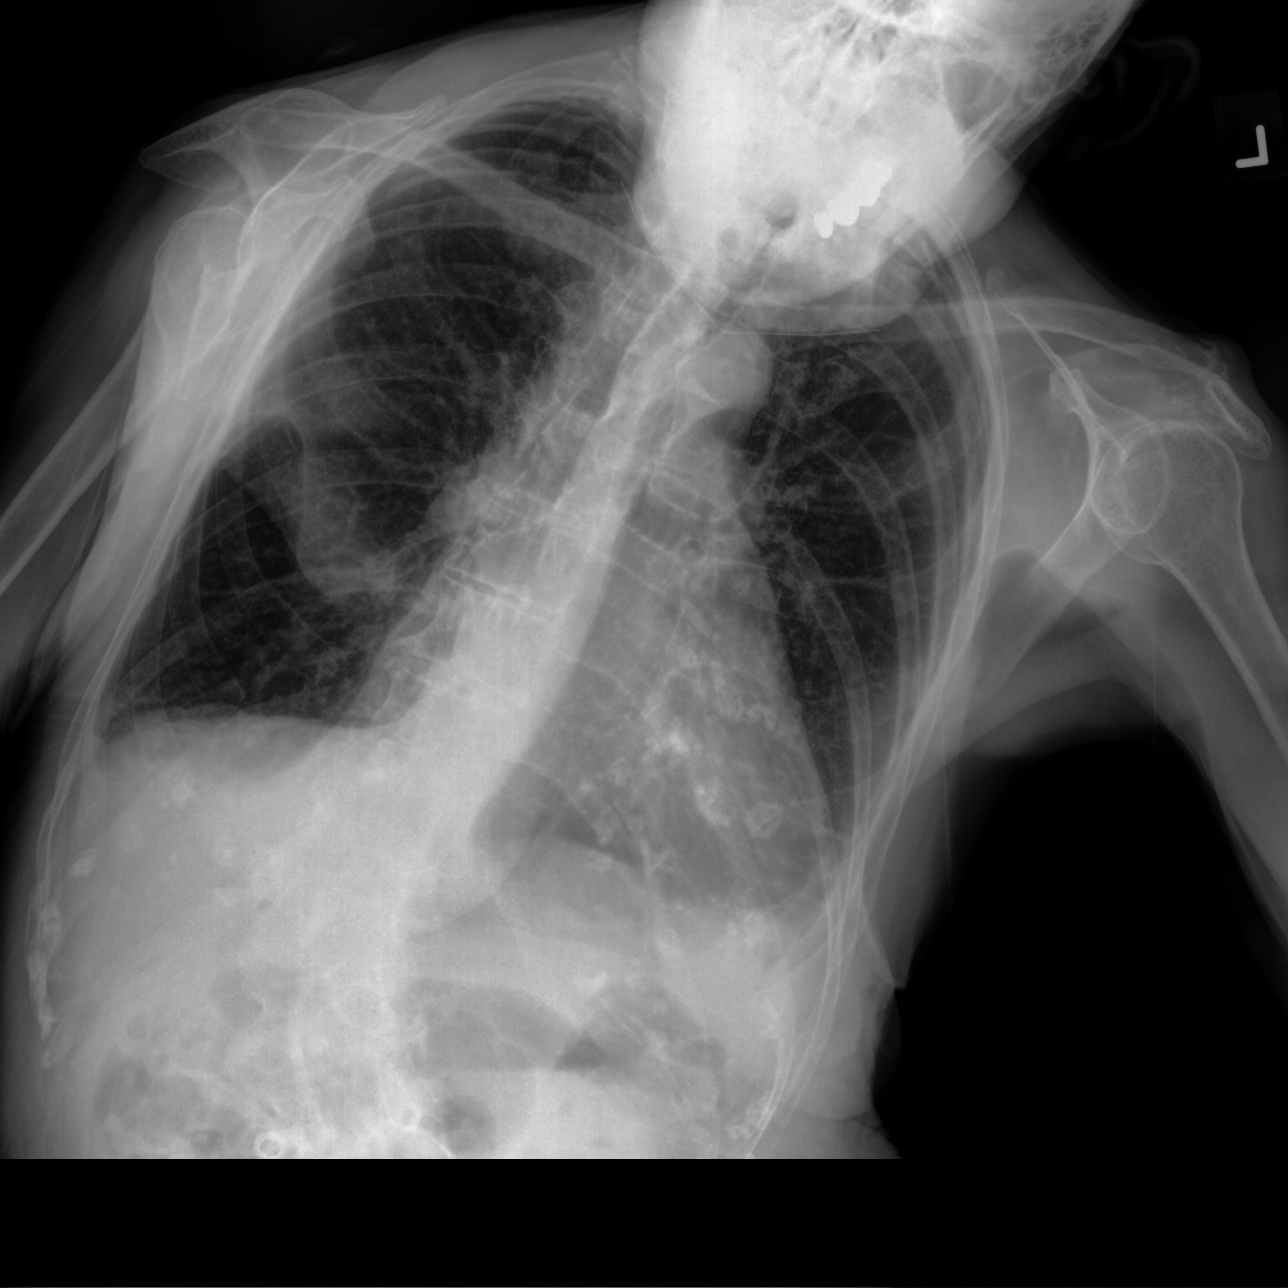

[chest lat]
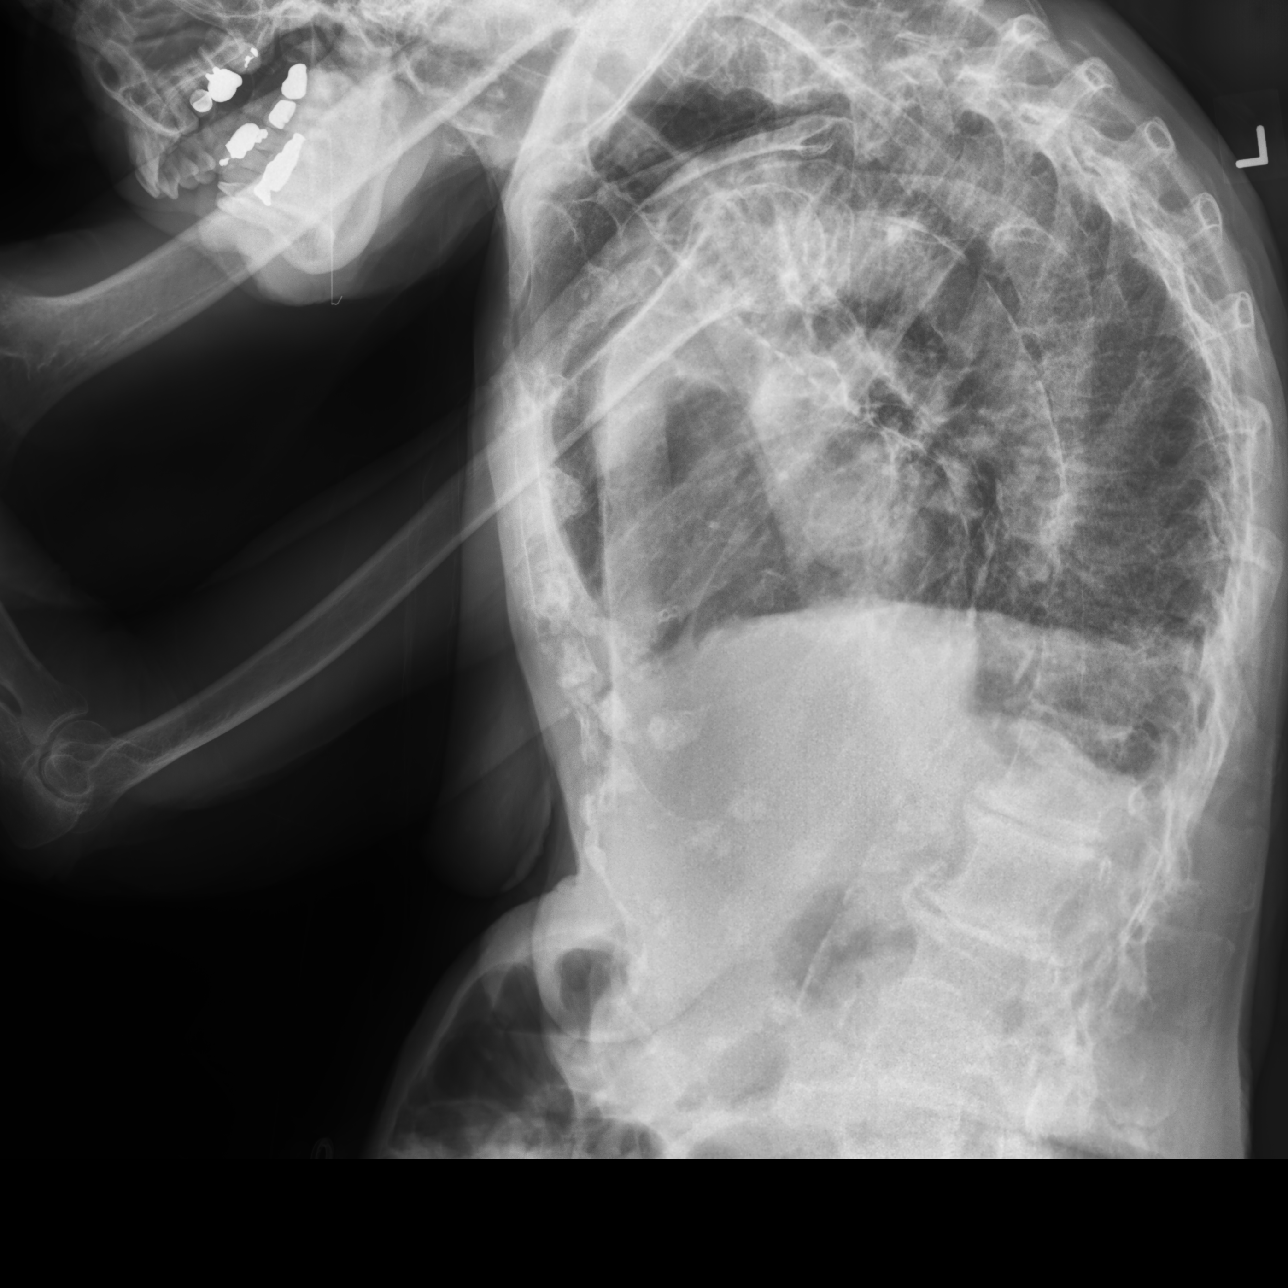

[chest ap]
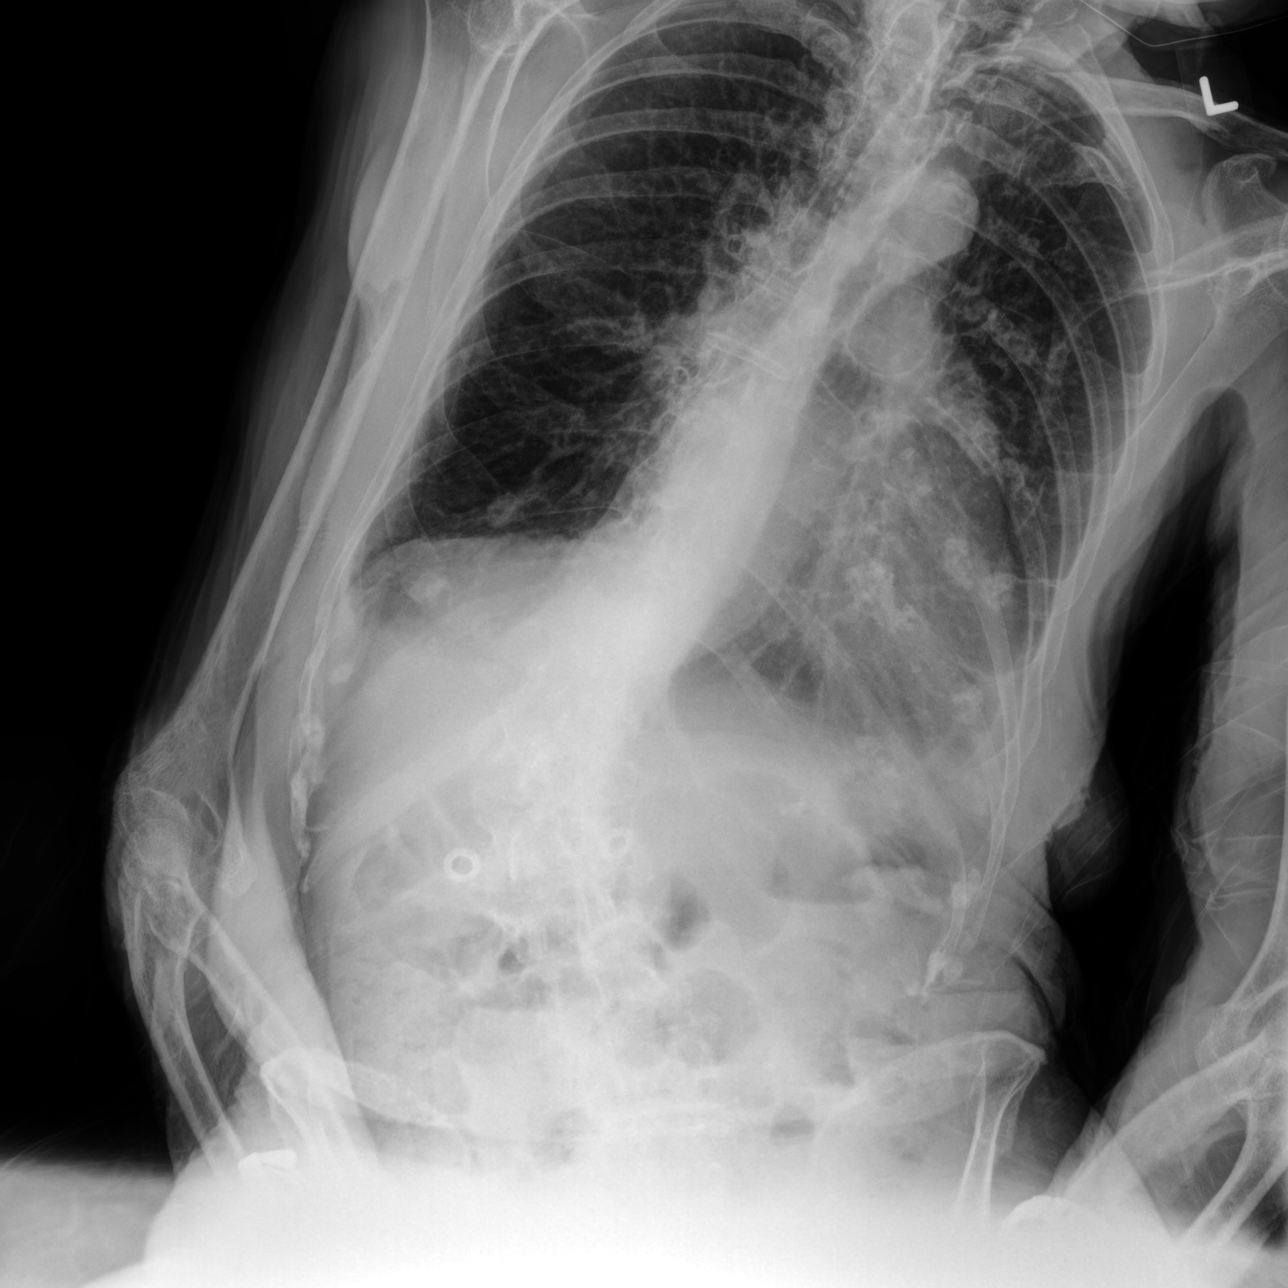

[3 of 3 positions shown; findings below may reference images not displayed]

FINDINGS: Reverse lordotic projection with the patient substantially hunched
over to the left on the frontal view attempts.

Bony demineralization.

Mild enlargement of the cardiopericardial silhouette.

Chronic blunting of the left lateral costophrenic angle.

Airway thickening is present, suggesting bronchitis or reactive
airways disease. Atherosclerotic calcification of the aortic arch.
Thoracic kyphosis and spondylosis along with lumbar spondylosis.
Substantial dextroconvex scoliosis at the thoracolumbar junction.
IMPRESSION: 1. Airway thickening is present, suggesting bronchitis or reactive
airways disease.
2. Stable mild enlargement of the cardiopericardial silhouette,
without overt edema.
3. Chronic blunting of the left lateral costophrenic angle
potentially from scarring or trace pleural effusion.
4.  Aortic Atherosclerosis ([id number]).
